# Patient Record
Sex: Female | Born: 1955 | Race: Black or African American | Hispanic: No | Marital: Single | Smoking: Former smoker
Health system: Southern US, Community
[De-identification: ages and names within clinical notes are randomized; demographics above are authoritative.]

## PROBLEM LIST (undated history)

## (undated) DIAGNOSIS — M1990 Unspecified osteoarthritis, unspecified site: Secondary | ICD-10-CM

## (undated) DIAGNOSIS — R51 Headache: Secondary | ICD-10-CM

## (undated) DIAGNOSIS — R350 Frequency of micturition: Secondary | ICD-10-CM

## (undated) DIAGNOSIS — T7840XA Allergy, unspecified, initial encounter: Secondary | ICD-10-CM

## (undated) DIAGNOSIS — R7303 Prediabetes: Secondary | ICD-10-CM

## (undated) DIAGNOSIS — E739 Lactose intolerance, unspecified: Secondary | ICD-10-CM

## (undated) DIAGNOSIS — F411 Generalized anxiety disorder: Secondary | ICD-10-CM

## (undated) DIAGNOSIS — R079 Chest pain, unspecified: Secondary | ICD-10-CM

## (undated) DIAGNOSIS — E039 Hypothyroidism, unspecified: Secondary | ICD-10-CM

## (undated) DIAGNOSIS — E0590 Thyrotoxicosis, unspecified without thyrotoxic crisis or storm: Secondary | ICD-10-CM

## (undated) DIAGNOSIS — K5900 Constipation, unspecified: Secondary | ICD-10-CM

## (undated) DIAGNOSIS — E119 Type 2 diabetes mellitus without complications: Secondary | ICD-10-CM

## (undated) DIAGNOSIS — R0600 Dyspnea, unspecified: Secondary | ICD-10-CM

## (undated) DIAGNOSIS — M7710 Lateral epicondylitis, unspecified elbow: Secondary | ICD-10-CM

## (undated) DIAGNOSIS — N809 Endometriosis, unspecified: Secondary | ICD-10-CM

## (undated) DIAGNOSIS — J45909 Unspecified asthma, uncomplicated: Secondary | ICD-10-CM

## (undated) DIAGNOSIS — I429 Cardiomyopathy, unspecified: Secondary | ICD-10-CM

## (undated) DIAGNOSIS — L989 Disorder of the skin and subcutaneous tissue, unspecified: Secondary | ICD-10-CM

## (undated) DIAGNOSIS — M722 Plantar fascial fibromatosis: Secondary | ICD-10-CM

## (undated) DIAGNOSIS — M25519 Pain in unspecified shoulder: Secondary | ICD-10-CM

## (undated) DIAGNOSIS — I509 Heart failure, unspecified: Secondary | ICD-10-CM

## (undated) DIAGNOSIS — N183 Chronic kidney disease, stage 3 unspecified: Secondary | ICD-10-CM

## (undated) DIAGNOSIS — M8580 Other specified disorders of bone density and structure, unspecified site: Secondary | ICD-10-CM

## (undated) DIAGNOSIS — M2550 Pain in unspecified joint: Secondary | ICD-10-CM

## (undated) DIAGNOSIS — E559 Vitamin D deficiency, unspecified: Secondary | ICD-10-CM

## (undated) DIAGNOSIS — R062 Wheezing: Secondary | ICD-10-CM

## (undated) DIAGNOSIS — H269 Unspecified cataract: Secondary | ICD-10-CM

## (undated) DIAGNOSIS — G4730 Sleep apnea, unspecified: Secondary | ICD-10-CM

## (undated) DIAGNOSIS — B009 Herpesviral infection, unspecified: Secondary | ICD-10-CM

## (undated) DIAGNOSIS — M542 Cervicalgia: Secondary | ICD-10-CM

## (undated) DIAGNOSIS — J019 Acute sinusitis, unspecified: Secondary | ICD-10-CM

## (undated) DIAGNOSIS — E669 Obesity, unspecified: Secondary | ICD-10-CM

## (undated) DIAGNOSIS — L509 Urticaria, unspecified: Secondary | ICD-10-CM

## (undated) DIAGNOSIS — J309 Allergic rhinitis, unspecified: Secondary | ICD-10-CM

## (undated) DIAGNOSIS — Z91018 Allergy to other foods: Secondary | ICD-10-CM

## (undated) DIAGNOSIS — Z9189 Other specified personal risk factors, not elsewhere classified: Secondary | ICD-10-CM

## (undated) DIAGNOSIS — E785 Hyperlipidemia, unspecified: Secondary | ICD-10-CM

## (undated) DIAGNOSIS — K219 Gastro-esophageal reflux disease without esophagitis: Secondary | ICD-10-CM

## (undated) DIAGNOSIS — R7302 Impaired glucose tolerance (oral): Secondary | ICD-10-CM

## (undated) DIAGNOSIS — M549 Dorsalgia, unspecified: Secondary | ICD-10-CM

## (undated) DIAGNOSIS — L259 Unspecified contact dermatitis, unspecified cause: Secondary | ICD-10-CM

## (undated) DIAGNOSIS — M79609 Pain in unspecified limb: Secondary | ICD-10-CM

## (undated) DIAGNOSIS — N390 Urinary tract infection, site not specified: Secondary | ICD-10-CM

## (undated) DIAGNOSIS — D179 Benign lipomatous neoplasm, unspecified: Secondary | ICD-10-CM

## (undated) DIAGNOSIS — J069 Acute upper respiratory infection, unspecified: Secondary | ICD-10-CM

## (undated) HISTORY — DX: Allergy, unspecified, initial encounter: T78.40XA

## (undated) HISTORY — DX: Allergy to other foods: Z91.018

## (undated) HISTORY — DX: Cardiomyopathy, unspecified: I42.9

## (undated) HISTORY — DX: Allergic rhinitis, unspecified: J30.9

## (undated) HISTORY — DX: Vitamin D deficiency, unspecified: E55.9

## (undated) HISTORY — DX: Hypothyroidism, unspecified: E03.9

## (undated) HISTORY — DX: Constipation, unspecified: K59.00

## (undated) HISTORY — DX: Pain in unspecified joint: M25.50

## (undated) HISTORY — DX: Chest pain, unspecified: R07.9

## (undated) HISTORY — DX: Cervicalgia: M54.2

## (undated) HISTORY — DX: Headache: R51

## (undated) HISTORY — DX: Chronic kidney disease, stage 3 unspecified: N18.30

## (undated) HISTORY — DX: Pain in unspecified shoulder: M25.519

## (undated) HISTORY — DX: Unspecified osteoarthritis, unspecified site: M19.90

## (undated) HISTORY — DX: Frequency of micturition: R35.0

## (undated) HISTORY — DX: Hyperlipidemia, unspecified: E78.5

## (undated) HISTORY — DX: Other specified disorders of bone density and structure, unspecified site: M85.80

## (undated) HISTORY — PX: LAPAROTOMY: SHX154

## (undated) HISTORY — DX: Lateral epicondylitis, unspecified elbow: M77.10

## (undated) HISTORY — DX: Plantar fascial fibromatosis: M72.2

## (undated) HISTORY — DX: Thyrotoxicosis, unspecified without thyrotoxic crisis or storm: E05.90

## (undated) HISTORY — DX: Unspecified asthma, uncomplicated: J45.909

## (undated) HISTORY — PX: ENDOMETRIAL ABLATION: SHX621

## (undated) HISTORY — DX: Sleep apnea, unspecified: G47.30

## (undated) HISTORY — DX: Unspecified contact dermatitis, unspecified cause: L25.9

## (undated) HISTORY — DX: Impaired glucose tolerance (oral): R73.02

## (undated) HISTORY — DX: Heart failure, unspecified: I50.9

## (undated) HISTORY — DX: Chronic kidney disease, stage 3 (moderate): N18.3

## (undated) HISTORY — DX: Dorsalgia, unspecified: M54.9

## (undated) HISTORY — DX: Acute upper respiratory infection, unspecified: J06.9

## (undated) HISTORY — DX: Acute sinusitis, unspecified: J01.90

## (undated) HISTORY — PX: OTHER SURGICAL HISTORY: SHX169

## (undated) HISTORY — DX: Disorder of the skin and subcutaneous tissue, unspecified: L98.9

## (undated) HISTORY — PX: COLONOSCOPY: SHX174

## (undated) HISTORY — PX: POLYPECTOMY: SHX149

## (undated) HISTORY — DX: Urticaria, unspecified: L50.9

## (undated) HISTORY — DX: Pain in unspecified limb: M79.609

## (undated) HISTORY — DX: Unspecified cataract: H26.9

## (undated) HISTORY — PX: UTERINE FIBROID SURGERY: SHX826

## (undated) HISTORY — DX: Lactose intolerance, unspecified: E73.9

## (undated) HISTORY — DX: Other specified personal risk factors, not elsewhere classified: Z91.89

## (undated) HISTORY — DX: Endometriosis, unspecified: N80.9

## (undated) HISTORY — DX: Urinary tract infection, site not specified: N39.0

## (undated) HISTORY — DX: Obesity, unspecified: E66.9

## (undated) HISTORY — DX: Generalized anxiety disorder: F41.1

## (undated) HISTORY — DX: Herpesviral infection, unspecified: B00.9

## (undated) HISTORY — DX: Benign lipomatous neoplasm, unspecified: D17.9

## (undated) HISTORY — PX: WISDOM TOOTH EXTRACTION: SHX21

## (undated) HISTORY — PX: TUBAL LIGATION: SHX77

## (undated) HISTORY — DX: Wheezing: R06.2

---

## 1994-10-10 HISTORY — PX: LAMINECTOMY: SHX219

## 1998-03-11 ENCOUNTER — Ambulatory Visit (HOSPITAL_COMMUNITY): Admission: RE | Admit: 1998-03-11 | Discharge: 1998-03-11 | Payer: Self-pay | Admitting: Family Medicine

## 1999-03-10 ENCOUNTER — Other Ambulatory Visit: Admission: RE | Admit: 1999-03-10 | Discharge: 1999-03-10 | Payer: Self-pay | Admitting: Obstetrics and Gynecology

## 2000-12-14 ENCOUNTER — Other Ambulatory Visit: Admission: RE | Admit: 2000-12-14 | Discharge: 2000-12-14 | Payer: Self-pay | Admitting: Obstetrics and Gynecology

## 2001-11-24 ENCOUNTER — Emergency Department (HOSPITAL_COMMUNITY): Admission: EM | Admit: 2001-11-24 | Discharge: 2001-11-24 | Payer: Self-pay | Admitting: Emergency Medicine

## 2002-04-08 ENCOUNTER — Other Ambulatory Visit: Admission: RE | Admit: 2002-04-08 | Discharge: 2002-04-08 | Payer: Self-pay | Admitting: Obstetrics and Gynecology

## 2003-03-11 ENCOUNTER — Encounter: Payer: Self-pay | Admitting: Internal Medicine

## 2003-03-11 LAB — CONVERTED CEMR LAB

## 2003-04-25 ENCOUNTER — Other Ambulatory Visit: Admission: RE | Admit: 2003-04-25 | Discharge: 2003-04-25 | Payer: Self-pay | Admitting: Obstetrics and Gynecology

## 2004-10-13 ENCOUNTER — Other Ambulatory Visit: Admission: RE | Admit: 2004-10-13 | Discharge: 2004-10-13 | Payer: Self-pay | Admitting: Obstetrics and Gynecology

## 2005-01-07 ENCOUNTER — Ambulatory Visit: Payer: Self-pay | Admitting: Internal Medicine

## 2005-03-03 ENCOUNTER — Emergency Department (HOSPITAL_COMMUNITY): Admission: EM | Admit: 2005-03-03 | Discharge: 2005-03-03 | Payer: Self-pay | Admitting: Emergency Medicine

## 2005-03-04 ENCOUNTER — Ambulatory Visit: Payer: Self-pay | Admitting: Internal Medicine

## 2005-03-10 LAB — HM MAMMOGRAPHY

## 2005-07-18 ENCOUNTER — Ambulatory Visit: Payer: Self-pay | Admitting: Internal Medicine

## 2005-09-09 HISTORY — PX: OTHER SURGICAL HISTORY: SHX169

## 2005-09-26 ENCOUNTER — Encounter (INDEPENDENT_AMBULATORY_CARE_PROVIDER_SITE_OTHER): Payer: Self-pay | Admitting: *Deleted

## 2005-09-26 ENCOUNTER — Ambulatory Visit (HOSPITAL_COMMUNITY): Admission: RE | Admit: 2005-09-26 | Discharge: 2005-09-26 | Payer: Self-pay | Admitting: Obstetrics and Gynecology

## 2005-10-26 ENCOUNTER — Ambulatory Visit: Payer: Self-pay | Admitting: Internal Medicine

## 2005-10-31 ENCOUNTER — Ambulatory Visit: Payer: Self-pay | Admitting: Internal Medicine

## 2005-11-02 ENCOUNTER — Other Ambulatory Visit: Admission: RE | Admit: 2005-11-02 | Discharge: 2005-11-02 | Payer: Self-pay | Admitting: Obstetrics and Gynecology

## 2006-04-07 ENCOUNTER — Ambulatory Visit: Payer: Self-pay | Admitting: Internal Medicine

## 2006-07-10 ENCOUNTER — Ambulatory Visit: Payer: Self-pay | Admitting: Internal Medicine

## 2006-07-18 ENCOUNTER — Ambulatory Visit: Payer: Self-pay

## 2006-08-26 ENCOUNTER — Ambulatory Visit: Payer: Self-pay | Admitting: Internal Medicine

## 2006-11-16 ENCOUNTER — Ambulatory Visit: Payer: Self-pay | Admitting: Internal Medicine

## 2007-02-15 ENCOUNTER — Ambulatory Visit: Payer: Self-pay | Admitting: Internal Medicine

## 2007-05-23 ENCOUNTER — Encounter: Payer: Self-pay | Admitting: Internal Medicine

## 2007-05-23 DIAGNOSIS — B009 Herpesviral infection, unspecified: Secondary | ICD-10-CM

## 2007-05-23 DIAGNOSIS — E0590 Thyrotoxicosis, unspecified without thyrotoxic crisis or storm: Secondary | ICD-10-CM

## 2007-05-23 DIAGNOSIS — E669 Obesity, unspecified: Secondary | ICD-10-CM

## 2007-05-23 DIAGNOSIS — N809 Endometriosis, unspecified: Secondary | ICD-10-CM

## 2007-05-23 DIAGNOSIS — Z9189 Other specified personal risk factors, not elsewhere classified: Secondary | ICD-10-CM

## 2007-05-23 DIAGNOSIS — J45909 Unspecified asthma, uncomplicated: Secondary | ICD-10-CM | POA: Insufficient documentation

## 2007-05-23 DIAGNOSIS — F411 Generalized anxiety disorder: Secondary | ICD-10-CM | POA: Insufficient documentation

## 2007-05-23 DIAGNOSIS — B0229 Other postherpetic nervous system involvement: Secondary | ICD-10-CM | POA: Insufficient documentation

## 2007-05-23 HISTORY — DX: Herpesviral infection, unspecified: B00.9

## 2007-05-23 HISTORY — DX: Other specified personal risk factors, not elsewhere classified: Z91.89

## 2007-05-23 HISTORY — DX: Generalized anxiety disorder: F41.1

## 2007-05-23 HISTORY — DX: Obesity, unspecified: E66.9

## 2007-05-23 HISTORY — DX: Unspecified asthma, uncomplicated: J45.909

## 2007-05-23 HISTORY — DX: Endometriosis, unspecified: N80.9

## 2007-05-23 HISTORY — DX: Thyrotoxicosis, unspecified without thyrotoxic crisis or storm: E05.90

## 2007-06-21 ENCOUNTER — Ambulatory Visit: Payer: Self-pay | Admitting: Internal Medicine

## 2007-06-21 LAB — CONVERTED CEMR LAB
ALT: 27 units/L (ref 0–35)
Alkaline Phosphatase: 79 units/L (ref 39–117)
Basophils Relative: 1.2 % — ABNORMAL HIGH (ref 0.0–1.0)
CO2: 28 meq/L (ref 19–32)
Eosinophils Absolute: 0 10*3/uL (ref 0.0–0.6)
GFR calc non Af Amer: 56 mL/min
Glucose, Bld: 90 mg/dL (ref 70–99)
HDL: 56.3 mg/dL (ref >39.0)
LDL Cholesterol: 80 mg/dL (ref 0–99)
Lymphocytes Relative: 27 % (ref 12.0–46.0)
Monocytes Absolute: 0.5 10*3/uL (ref 0.2–0.7)
Neutro Abs: 4.7 10*3/uL (ref 1.4–7.7)
Neutrophils Relative %: 64.2 % (ref 43.0–77.0)
Platelets: 240 10*3/uL (ref 150–400)
Potassium: 3.7 meq/L (ref 3.5–5.1)
RDW: 11.9 % (ref 11.5–14.6)
Specific Gravity, Urine: <=1.005 (ref 1.000–1.03)
Total Bilirubin: 0.8 mg/dL (ref 0.3–1.2)
Total Protein, Urine: NEGATIVE mg/dL
Total Protein: 7.1 g/dL (ref 6.0–8.3)
Urine Glucose: NEGATIVE mg/dL
Urobilinogen, UA: 0.2 (ref 0.0–1.0)
WBC: 7.3 10*3/uL (ref 4.5–10.5)
pH: 7 (ref 5.0–8.0)

## 2007-06-22 ENCOUNTER — Ambulatory Visit: Payer: Self-pay | Admitting: Internal Medicine

## 2007-11-02 ENCOUNTER — Telehealth (INDEPENDENT_AMBULATORY_CARE_PROVIDER_SITE_OTHER): Payer: Self-pay | Admitting: *Deleted

## 2008-01-10 ENCOUNTER — Encounter: Payer: Self-pay | Admitting: Internal Medicine

## 2008-04-29 ENCOUNTER — Ambulatory Visit: Payer: Self-pay | Admitting: Internal Medicine

## 2008-04-29 DIAGNOSIS — N390 Urinary tract infection, site not specified: Secondary | ICD-10-CM

## 2008-04-29 HISTORY — DX: Urinary tract infection, site not specified: N39.0

## 2008-04-29 LAB — CONVERTED CEMR LAB
Bacteria, UA: NEGATIVE
Bilirubin Urine: NEGATIVE
Blood in Urine, dipstick: NEGATIVE
Crystals: NEGATIVE
Ketones, ur: NEGATIVE mg/dL
Ketones, urine, test strip: NEGATIVE
Mucus, UA: NEGATIVE
Nitrite: NEGATIVE
Nitrite: NEGATIVE
Protein, U semiquant: NEGATIVE
Specific Gravity, Urine: 1
Urobilinogen, UA: 0.2
Urobilinogen, UA: 0.2 (ref 0.0–1.0)

## 2008-04-30 ENCOUNTER — Encounter: Payer: Self-pay | Admitting: Internal Medicine

## 2008-05-09 ENCOUNTER — Encounter: Payer: Self-pay | Admitting: Internal Medicine

## 2008-05-09 DIAGNOSIS — J309 Allergic rhinitis, unspecified: Secondary | ICD-10-CM | POA: Insufficient documentation

## 2008-05-09 DIAGNOSIS — E039 Hypothyroidism, unspecified: Secondary | ICD-10-CM | POA: Insufficient documentation

## 2008-05-09 HISTORY — DX: Hypothyroidism, unspecified: E03.9

## 2008-05-09 HISTORY — DX: Allergic rhinitis, unspecified: J30.9

## 2008-07-21 ENCOUNTER — Ambulatory Visit: Payer: Self-pay | Admitting: Internal Medicine

## 2008-07-24 ENCOUNTER — Telehealth: Payer: Self-pay | Admitting: Internal Medicine

## 2008-07-25 ENCOUNTER — Ambulatory Visit: Payer: Self-pay | Admitting: Internal Medicine

## 2008-07-25 DIAGNOSIS — R079 Chest pain, unspecified: Secondary | ICD-10-CM

## 2008-07-25 HISTORY — DX: Chest pain, unspecified: R07.9

## 2008-11-03 ENCOUNTER — Ambulatory Visit: Payer: Self-pay | Admitting: Internal Medicine

## 2008-11-03 DIAGNOSIS — J019 Acute sinusitis, unspecified: Secondary | ICD-10-CM

## 2008-11-03 HISTORY — DX: Acute sinusitis, unspecified: J01.90

## 2009-03-10 ENCOUNTER — Ambulatory Visit: Payer: Self-pay | Admitting: Internal Medicine

## 2009-03-10 ENCOUNTER — Telehealth: Payer: Self-pay | Admitting: Internal Medicine

## 2009-03-10 DIAGNOSIS — M7710 Lateral epicondylitis, unspecified elbow: Secondary | ICD-10-CM

## 2009-03-10 HISTORY — DX: Lateral epicondylitis, unspecified elbow: M77.10

## 2009-03-10 LAB — CONVERTED CEMR LAB
ALT: 22 units/L (ref 0–35)
AST: 29 units/L (ref 0–37)
Albumin: 4.1 g/dL (ref 3.5–5.2)
Bilirubin Urine: NEGATIVE
Bilirubin, Direct: 0.2 mg/dL (ref 0.0–0.3)
Cholesterol: 185 mg/dL (ref 0–200)
Eosinophils Absolute: 0.1 10*3/uL (ref 0.0–0.7)
Eosinophils Relative: 0.8 % (ref 0.0–5.0)
LDL Cholesterol: 99 mg/dL (ref 0–99)
Lymphocytes Relative: 26.6 % (ref 12.0–46.0)
Lymphs Abs: 1.9 10*3/uL (ref 0.7–4.0)
MCHC: 34.9 g/dL (ref 30.0–36.0)
MCV: 89.6 fL (ref 78.0–100.0)
Monocytes Absolute: 0.4 10*3/uL (ref 0.1–1.0)
Neutro Abs: 4.5 10*3/uL (ref 1.4–7.7)
Neutrophils Relative %: 66.7 % (ref 43.0–77.0)
Nitrite: NEGATIVE
Platelets: 255 10*3/uL (ref 150.0–400.0)
RBC: 4.72 M/uL (ref 3.87–5.11)
Sodium: 142 meq/L (ref 135–145)
TSH: 6.01 microintl units/mL — ABNORMAL HIGH (ref 0.35–5.50)
Total Bilirubin: 1.3 mg/dL — ABNORMAL HIGH (ref 0.3–1.2)
Total Protein: 7.5 g/dL (ref 6.0–8.3)
Triglycerides: 40 mg/dL (ref 0.0–149.0)
Urobilinogen, UA: 0.2 (ref 0.0–1.0)
pH: 6 (ref 5.0–8.0)

## 2009-04-28 ENCOUNTER — Telehealth (INDEPENDENT_AMBULATORY_CARE_PROVIDER_SITE_OTHER): Payer: Self-pay | Admitting: *Deleted

## 2009-04-28 ENCOUNTER — Encounter: Payer: Self-pay | Admitting: Internal Medicine

## 2009-04-30 ENCOUNTER — Encounter: Payer: Self-pay | Admitting: Internal Medicine

## 2009-04-30 LAB — CONVERTED CEMR LAB: Vit D, 25-Hydroxy: 15 ng/mL — ABNORMAL LOW (ref 30–89)

## 2009-05-05 ENCOUNTER — Telehealth (INDEPENDENT_AMBULATORY_CARE_PROVIDER_SITE_OTHER): Payer: Self-pay | Admitting: *Deleted

## 2009-05-11 ENCOUNTER — Ambulatory Visit: Payer: Self-pay | Admitting: Internal Medicine

## 2009-05-12 LAB — CONVERTED CEMR LAB: Anti Nuclear Antibody(ANA): NEGATIVE

## 2009-05-14 ENCOUNTER — Telehealth: Payer: Self-pay | Admitting: Internal Medicine

## 2009-06-04 ENCOUNTER — Encounter: Payer: Self-pay | Admitting: Internal Medicine

## 2009-06-04 ENCOUNTER — Ambulatory Visit: Payer: Self-pay | Admitting: Internal Medicine

## 2009-06-04 DIAGNOSIS — L509 Urticaria, unspecified: Secondary | ICD-10-CM

## 2009-06-04 HISTORY — DX: Urticaria, unspecified: L50.9

## 2009-06-04 LAB — CONVERTED CEMR LAB
ALT: 18 units/L (ref 0–35)
BUN: 9 mg/dL (ref 6–23)
CO2: 29 meq/L (ref 19–32)
Chloride: 104 meq/L (ref 96–112)
Creatinine, Ser: 1 mg/dL (ref 0.4–1.2)
Eosinophils Relative: 0.7 % (ref 0.0–5.0)
GFR calc non Af Amer: 74.83 mL/min (ref >60)
Glucose, Urine, Semiquant: NEGATIVE
HCT: 41.4 % (ref 36.0–46.0)
Lymphocytes Relative: 27.4 % (ref 12.0–46.0)
Lymphs Abs: 2.3 10*3/uL (ref 0.7–4.0)
MCHC: 35.4 g/dL (ref 30.0–36.0)
MCV: 89.4 fL (ref 78.0–100.0)
Monocytes Relative: 5.3 % (ref 3.0–12.0)
Neutro Abs: 5.4 10*3/uL (ref 1.4–7.7)
Neutrophils Relative %: 65.5 % (ref 43.0–77.0)
Platelets: 241 10*3/uL (ref 150.0–400.0)
Potassium: 3.6 meq/L (ref 3.5–5.1)
RDW: 12.6 % (ref 11.5–14.6)
Specific Gravity, Urine: <1.005
TSH: 1.53 microintl units/mL (ref 0.35–5.50)
Urobilinogen, UA: 0.2
WBC: 8.3 10*3/uL (ref 4.5–10.5)
pH: 5

## 2009-06-05 ENCOUNTER — Encounter (INDEPENDENT_AMBULATORY_CARE_PROVIDER_SITE_OTHER): Payer: Self-pay | Admitting: *Deleted

## 2009-06-05 ENCOUNTER — Encounter: Payer: Self-pay | Admitting: Internal Medicine

## 2009-06-07 LAB — CONVERTED CEMR LAB
Hep B S Ab: NEGATIVE
Hepatitis B Surface Ag: NEGATIVE

## 2009-06-08 ENCOUNTER — Telehealth: Payer: Self-pay | Admitting: Internal Medicine

## 2009-06-09 ENCOUNTER — Ambulatory Visit: Payer: Self-pay | Admitting: Internal Medicine

## 2009-06-09 DIAGNOSIS — L259 Unspecified contact dermatitis, unspecified cause: Secondary | ICD-10-CM

## 2009-06-09 HISTORY — DX: Unspecified contact dermatitis, unspecified cause: L25.9

## 2009-06-11 ENCOUNTER — Encounter: Payer: Self-pay | Admitting: Internal Medicine

## 2009-08-28 ENCOUNTER — Ambulatory Visit: Payer: Self-pay | Admitting: Internal Medicine

## 2009-08-28 DIAGNOSIS — E739 Lactose intolerance, unspecified: Secondary | ICD-10-CM

## 2009-08-28 DIAGNOSIS — E559 Vitamin D deficiency, unspecified: Secondary | ICD-10-CM

## 2009-08-28 DIAGNOSIS — J069 Acute upper respiratory infection, unspecified: Secondary | ICD-10-CM | POA: Insufficient documentation

## 2009-08-28 HISTORY — DX: Acute upper respiratory infection, unspecified: J06.9

## 2009-08-28 HISTORY — DX: Lactose intolerance, unspecified: E73.9

## 2009-08-28 HISTORY — DX: Vitamin D deficiency, unspecified: E55.9

## 2009-09-24 ENCOUNTER — Encounter: Payer: Self-pay | Admitting: Internal Medicine

## 2009-09-25 ENCOUNTER — Encounter: Payer: Self-pay | Admitting: Internal Medicine

## 2009-09-28 ENCOUNTER — Telehealth: Payer: Self-pay | Admitting: Internal Medicine

## 2009-10-01 ENCOUNTER — Encounter: Payer: Self-pay | Admitting: Internal Medicine

## 2009-10-11 ENCOUNTER — Emergency Department (HOSPITAL_COMMUNITY): Admission: EM | Admit: 2009-10-11 | Discharge: 2009-10-12 | Payer: Self-pay | Admitting: Emergency Medicine

## 2009-10-12 ENCOUNTER — Encounter: Payer: Self-pay | Admitting: Internal Medicine

## 2009-10-19 ENCOUNTER — Encounter (INDEPENDENT_AMBULATORY_CARE_PROVIDER_SITE_OTHER): Payer: Self-pay | Admitting: *Deleted

## 2009-10-23 ENCOUNTER — Ambulatory Visit: Payer: Self-pay | Admitting: Internal Medicine

## 2009-10-23 DIAGNOSIS — M79609 Pain in unspecified limb: Secondary | ICD-10-CM

## 2009-10-23 HISTORY — DX: Pain in unspecified limb: M79.609

## 2010-04-15 ENCOUNTER — Encounter: Payer: Self-pay | Admitting: Internal Medicine

## 2010-05-17 ENCOUNTER — Ambulatory Visit: Payer: Self-pay | Admitting: Internal Medicine

## 2010-05-17 DIAGNOSIS — R350 Frequency of micturition: Secondary | ICD-10-CM

## 2010-05-17 DIAGNOSIS — M25519 Pain in unspecified shoulder: Secondary | ICD-10-CM

## 2010-05-17 HISTORY — DX: Pain in unspecified shoulder: M25.519

## 2010-05-17 HISTORY — DX: Frequency of micturition: R35.0

## 2010-05-17 LAB — CONVERTED CEMR LAB
Bilirubin Urine: NEGATIVE
Ketones, urine, test strip: NEGATIVE
Nitrite: NEGATIVE
Protein, U semiquant: NEGATIVE
Urobilinogen, UA: 0.2
WBC Urine, dipstick: NEGATIVE
pH: 5

## 2010-05-18 ENCOUNTER — Encounter: Payer: Self-pay | Admitting: Internal Medicine

## 2010-05-24 ENCOUNTER — Telehealth: Payer: Self-pay | Admitting: Internal Medicine

## 2010-06-02 ENCOUNTER — Ambulatory Visit: Payer: Self-pay | Admitting: Internal Medicine

## 2010-06-02 DIAGNOSIS — R51 Headache: Secondary | ICD-10-CM

## 2010-06-02 DIAGNOSIS — R519 Headache, unspecified: Secondary | ICD-10-CM | POA: Insufficient documentation

## 2010-06-02 HISTORY — DX: Headache: R51

## 2010-06-03 ENCOUNTER — Telehealth: Payer: Self-pay | Admitting: Internal Medicine

## 2010-06-03 ENCOUNTER — Ambulatory Visit: Payer: Self-pay | Admitting: Internal Medicine

## 2010-06-03 ENCOUNTER — Telehealth (INDEPENDENT_AMBULATORY_CARE_PROVIDER_SITE_OTHER): Payer: Self-pay | Admitting: *Deleted

## 2010-06-07 ENCOUNTER — Encounter: Payer: Self-pay | Admitting: Internal Medicine

## 2010-07-12 ENCOUNTER — Ambulatory Visit: Payer: Self-pay | Admitting: Internal Medicine

## 2010-07-12 DIAGNOSIS — R062 Wheezing: Secondary | ICD-10-CM

## 2010-07-12 HISTORY — DX: Wheezing: R06.2

## 2010-08-02 ENCOUNTER — Encounter: Payer: Self-pay | Admitting: Internal Medicine

## 2010-08-02 LAB — CONVERTED CEMR LAB
AST: 19 units/L (ref 0–37)
Alkaline Phosphatase: 67 units/L (ref 39–117)
Chloride: 105 meq/L (ref 96–112)
Cholesterol: 167 mg/dL (ref 0–200)
Creatinine, Ser: 0.9 mg/dL (ref 0.4–1.2)
GFR calc non Af Amer: 84.13 mL/min (ref >60)
HCT: 38.1 % (ref 36.0–46.0)
HDL: 68.2 mg/dL (ref >39.00)
Hgb A1c MFr Bld: 5.3 % (ref 4.6–6.5)
LDL Cholesterol: 89 mg/dL (ref 0–99)
Lymphs Abs: 1.7 10*3/uL (ref 0.7–4.0)
MCHC: 34.9 g/dL (ref 30.0–36.0)
Monocytes Relative: 7.4 % (ref 3.0–12.0)
RBC: 4.21 M/uL (ref 3.87–5.11)
RDW: 13.3 % (ref 11.5–14.6)
Specific Gravity, Urine: <=1.005 (ref 1.000–1.030)
TSH: 5.4 microintl units/mL (ref 0.35–5.50)
Total CHOL/HDL Ratio: 2
Triglycerides: 49 mg/dL (ref 0.0–149.0)
Urine Glucose: NEGATIVE mg/dL
Urobilinogen, UA: 0.2 (ref 0.0–1.0)
VLDL: 9.8 mg/dL (ref 0.0–40.0)
WBC: 6.7 10*3/uL (ref 4.5–10.5)
pH: 6.5 (ref 5.0–8.0)

## 2010-10-22 ENCOUNTER — Ambulatory Visit
Admission: RE | Admit: 2010-10-22 | Discharge: 2010-10-22 | Payer: Self-pay | Source: Home / Self Care | Attending: Internal Medicine | Admitting: Internal Medicine

## 2010-10-22 DIAGNOSIS — D179 Benign lipomatous neoplasm, unspecified: Secondary | ICD-10-CM | POA: Insufficient documentation

## 2010-10-22 HISTORY — DX: Benign lipomatous neoplasm, unspecified: D17.9

## 2010-11-09 NOTE — Progress Notes (Signed)
  Phone Note Call from Patient   Summary of Call: Patient is requesting rx for antibiotic. Her dentist has canceled her surgery until after scan and ok from Dr [name].  Initial call taken by: Lamar Sprinkles, CMA,  June 03, 2010 11:52 AM  Follow-up for Phone Call        she already has doxycycline for now; and I have not seen results Ct scan;  not clear if she needs other antibx at this time Follow-up by: Corwin Levins MD,  June 03, 2010 1:11 PM  Additional Follow-up for Phone Call Additional follow up Details #1::        Pt informed at office by [facility] office personnel after speaking with JWJ Additional Follow-up by: Margaret Pyle, CMA,  June 03, 2010 1:42 PM

## 2010-11-09 NOTE — Assessment & Plan Note (Signed)
Summary: ringing in right ear/cd   Vital Signs:  Patient profile:   55 year old female Height:      65 inches Weight:      212 pounds BMI:     35.41 O2 Sat:      97 % on Room air Temp:     97.5 degrees F oral Pulse rate:   79 / minute BP sitting:   110 / 72  (left arm) Cuff size:   regular  Vitals Entered ByZella Ball Ewing (October 23, 2009 5:17 PM)  O2 Flow:  Room air CC: ringing in both ears,sharp head pain/RE   Primary Care Provider:  Corwin Levins MD  CC:  ringing in both ears and sharp head pain/RE.  History of Present Illness: here with 3 days increaseing bilateral ear pain, mod to severe with sharp shooting pain headache, tinnitus off and on, with malaise and fever, mild sinus congestion;  has seen ENT in the past with exam not c/w need for sinus surgury;  tx with vit D recently per GYN - now on 50K vit d per wk for several months;  has ongoing sinus allergy symptoms prior to more acute symtpoms above for several months no responding to previous tx;  had recent breast biopsy left side (benign) but since then seemingly with vague left shoudler and arm aching but nontender, nonswollen without erythema or rash and no change in normal ROM or other funciton;  Pt denies CP, sob, doe, wheezing, orthopnea, pnd, worsening LE edema, palps, dizziness or syncope  Pt denies new neuro symptoms such as other headache, facial or extremity weakness   Problems Prior to Update: 1)  Uri  (ICD-465.9) 2)  Vitamin D Deficiency  (ICD-268.9) 3)  Glucose Intolerance  (ICD-271.3) 4)  Uri  (ICD-465.9) 5)  Contact Dermatitis  (ICD-692.9) 6)  Chest Pain  (ICD-786.50) 7)  Unspecified Urticaria  (ICD-708.9) 8)  Lateral Epicondylitis, Right  (ICD-726.32) 9)  Preventive Health Care  (ICD-V70.0) 10)  Sinusitis- Acute-nos  (ICD-461.9) 11)  Chest Pain Unspecified  (ICD-786.50) 12)  Allergic Rhinitis  (ICD-477.9) 13)  Hypothyroidism  (ICD-244.9) 14)  Uti  (ICD-599.0) 15)  Herpes Zoster W/nervous  Complication Nec  (ICD-053.19) 16)  Insomnia, Hx of  (ICD-V15.89) 17)  Anxiety  (ICD-300.00) 18)  Endometriosis Nos  (ICD-617.9) 19)  Herpes Simplex, Uncomplicated  (ICD-054.9) 20)  Obesity  (ICD-278.00) 21)  Asthma  (ICD-493.90) 22)  Hyperthyroidism  (ICD-242.90)  Medications Prior to Update: 1)  Symbicort 160-4.5 Mcg/act Aero (Budesonide-Formoterol Fumarate) .... 2 Puffs Two Times A Day 2)  Proair Hfa 108 (90 Base) Mcg/act Aers (Albuterol Sulfate) .... 2 Puff Four Times Per Day As Needed 3)  Levothyroxine Sodium 125 Mcg Tabs (Levothyroxine Sodium) .[name].. 1po Once Daily 4)  Hydroxyzine Hcl 10 Mg Tabs (Hydroxyzine Hcl) .... Take 1 By Mouth Qd 5)  Fluocinolone Acetonide 0.025 % Crea (Fluocinolone Acetonide) .... Use Prn 6)  Cervae Lotion .... Use Qd 7)  Benadryl 25 Mg Tabs (Diphenhydramine Hcl) .... Take 1 By Mouth Qd 8)  Claritin 10 Mg Tabs (Loratadine) .[name].. 1 By Mouth Two Times A Day 9)  Womens One Daily  Tabs (Multiple Vitamins-Minerals) .... Take 1 By Mouth Qd 10)  Caltrate 600 1500 Mg Tabs (Calcium Carbonate) .... Take 1 By Mouth Qd 11)  Triamcinolone Acetonide 0.5 % Crea (Triamcinolone Acetonide) .... Apply To Affected Skin -3x Daily 12)  Fluconazole 150 Mg Tabs (Fluconazole) .[name].. 1 By Mouth Once Daily As Needed For Yeast 13)  Cephalexin 500 Mg Caps (Cephalexin) .[name].. 1 By Mouth Three Times A Day  Current Medications (verified): 1)  Symbicort 160-4.5 Mcg/act Aero (Budesonide-Formoterol Fumarate) .... 2 Puffs Two Times A Day 2)  Proair Hfa 108 (90 Base) Mcg/act Aers (Albuterol Sulfate) .... 2 Puff Four Times Per Day As Needed 3)  Levothyroxine Sodium 125 Mcg Tabs (Levothyroxine Sodium) .[name].. 1po Once Daily 4)  Hydroxyzine Hcl 10 Mg Tabs (Hydroxyzine Hcl) .... Take 1 By Mouth Qd 5)  Fluocinolone Acetonide 0.025 % Crea (Fluocinolone Acetonide) .... Use Prn 6)  Cervae Lotion .... Use Qd 7)  Benadryl 25 Mg Tabs (Diphenhydramine Hcl) .... Take 1 By Mouth Qd 8)  Claritin 10 Mg Tabs  (Loratadine) .[name].. 1 By Mouth Two Times A Day 9)  Womens One Daily  Tabs (Multiple Vitamins-Minerals) .... Take 1 By Mouth Qd 10)  Caltrate 600 1500 Mg Tabs (Calcium Carbonate) .... Take 1 By Mouth Qd 11)  Triamcinolone Acetonide 0.5 % Crea (Triamcinolone Acetonide) .... Apply To Affected Skin -3x Daily 12)  Fluconazole 150 Mg Tabs (Fluconazole) .[name].. 1 By Mouth Once Daily As Needed For Yeast 13)  Cephalexin 500 Mg Caps (Cephalexin) .[name].. 1 By Mouth Three Times A Day 14)  Medroxyprogesterone Acetate 2.5 Mg Tabs (Medroxyprogesterone Acetate) .[name].. 1 Times 10 Days Every Month 15)  Vivelle-Dot 0.1 Mg/24hr Pttw (Estradiol) .... 2 Times A Week 16)  Tramadol Hcl 50 Mg Tabs (Tramadol Hcl) .[name].. 1 By Mouth Qid As Needed 17)  Flexeril 5 Mg Tabs (Cyclobenzaprine Hcl) .[name].. 1 By Mouth Three Times A Day  Allergies (verified): 1)  ! Flagyl 2)  ! * Z Pack 3)  ! Biaxin  Past History:  Past Medical History: Last updated: 08/28/2009 Asthma Obesity Herpes, oral Hx  Endometriosis Anxiety Insomnia Shingles 1/07 Hypothyroidism - per endocrinology Allergic rhinitis c-spine DJD/DDD glucose intolerance  Past Surgical History: Last updated: 05/09/2008 Laminectomy- 1996 Laparotomy-exploratory s/p endometrial ablation 12/06 Tubal ligation  Social History: Last updated: 06/09/2009 Married Alcohol use-no 3 children Never Smoked Systems developer [place] Marital Status: Married Children:  Occupation:   Risk Factors: Smoking Status: never (05/09/2008)  Review of Systems       all otherwise negative per pt -  Physical Exam  General:  alert and overweight-appearing.   Head:  normocephalic and atraumatic.   Eyes:  vision grossly intact, pupils equal, and pupils round.   Ears:  bilat tm's red, sinus nontender Nose:  no external deformity, nasal dischargemucosal pallor, and mucosal erythema.   Mouth:  pharyngeal erythema and fair dentition.   Neck:  supple and cervical  lymphadenopathy.   Lungs:  normal respiratory effort and normal breath sounds.   Heart:  normal rate and regular rhythm.   Abdomen:  soft, non-tender, and normal bowel sounds.   Msk:  no joint tenderness and no joint swelling.  , left shoudler nontender and FROM, no rash Extremities:  no edema, no erythema  Neurologic:  cranial nerves II-XII intact, strength normal in upper extremities, and sensation intact to light touch.     Impression & Recommendations:  Problem # 1:  URI (ICD-465.9)  Her updated medication list for this problem includes:    Benadryl 25 Mg Tabs (Diphenhydramine hcl) .[name]... Take 1 by mouth qd    Claritin 10 Mg Tabs (Loratadine) .[name]... 1 by mouth two times a day treat as above, f/u any worsening signs or symptoms , with antibx  Problem # 2:  ALLERGIC RHINITIS (ICD-477.9)  Her updated medication list for  this problem includes:    Benadryl 25 Mg Tabs (Diphenhydramine hcl) .[name]... Take 1 by mouth qd    Claritin 10 Mg Tabs (Loratadine) .[name]... 1 by mouth two times a day treat as above, f/u any worsening signs or symptoms , declines trial change to allegra at this time  Problem # 3:  ARM PAIN, LEFT (ICD-729.5) exam benign, tx with pain med as needed; no CP or other CV symptoms  Problem # 4:  VITAMIN D DEFICIENCY (ICD-268.9) d/w pt  - cont tx as per GYN  Complete Medication List: 1)  Symbicort 160-4.5 Mcg/act Aero (Budesonide-formoterol fumarate) .... 2 puffs two times a day 2)  Proair Hfa 108 (90 Base) Mcg/act Aers (Albuterol sulfate) .... 2 puff four times per day as needed 3)  Levothyroxine Sodium 125 Mcg Tabs (Levothyroxine sodium) .[name].. 1po once daily 4)  Hydroxyzine Hcl 10 Mg Tabs (Hydroxyzine hcl) .... Take 1 by mouth qd 5)  Fluocinolone Acetonide 0.025 % Crea (Fluocinolone acetonide) .... Use prn 6)  Cervae Lotion  .... Use qd 7)  Benadryl 25 Mg Tabs (Diphenhydramine hcl) .... Take 1 by mouth qd 8)  Claritin 10 Mg Tabs (Loratadine) .[name].. 1 by mouth two times a  day 9)  Womens One Daily Tabs (Multiple vitamins-minerals) .... Take 1 by mouth qd 10)  Caltrate 600 1500 Mg Tabs (Calcium carbonate) .... Take 1 by mouth qd 11)  Triamcinolone Acetonide 0.5 % Crea (Triamcinolone acetonide) .... Apply to affected skin -3x daily 12)  Fluconazole 150 Mg Tabs (Fluconazole) .[name].. 1 by mouth once daily as needed for yeast 13)  Cephalexin 500 Mg Caps (Cephalexin) .[name].. 1 by mouth three times a day 14)  Medroxyprogesterone Acetate 2.5 Mg Tabs (Medroxyprogesterone acetate) .[name].. 1 times 10 days every month 15)  Vivelle-dot 0.1 Mg/24hr Pttw (Estradiol) .... 2 times a week 16)  Tramadol Hcl 50 Mg Tabs (Tramadol hcl) .[name].. 1 by mouth qid as needed 17)  Flexeril 5 Mg Tabs (Cyclobenzaprine hcl) .[name].. 1 by mouth three times a day  Patient Instructions: 1)  Please take all new medications as prescribed 2)  Continue all previous medications as before this visit  3)  You can also use Mucinex OTC or it's generic for congestion  4)  Please schedule a follow-up appointment as needed. Prescriptions: FLEXERIL 5 MG TABS (CYCLOBENZAPRINE HCL) 1 by mouth three times a day  #60 x 1   Entered and Authorized by:   Corwin Levins MD   Signed by:   Corwin Levins MD on 10/23/2009   Method used:   Print then Give to Patient   RxID:   [id number] TRAMADOL HCL 50 MG TABS (TRAMADOL HCL) 1 by mouth qid as needed  #60 x 1   Entered and Authorized by:   Corwin Levins MD   Signed by:   Corwin Levins MD on 10/23/2009   Method used:   Print then Give to Patient   RxID:   [id number] CEPHALEXIN 500 MG CAPS (CEPHALEXIN) 1 by mouth three times a day  #30 x 0   Entered and Authorized by:   Corwin Levins MD   Signed by:   Corwin Levins MD on 10/23/2009   Method used:   Print then Give to Patient   RxID:   [id number]

## 2010-11-09 NOTE — Letter (Signed)
Summary: [facility]  [facility]   Imported By: Sherian Rein 06/15/2010 13:34:49  _____________________________________________________________________  External Attachment:    Type:   Image     Comment:   External Document

## 2010-11-09 NOTE — Progress Notes (Signed)
Summary: CT Scan?  Phone Note Call from Patient   Caller: Patient [phone] Summary of Call: Pt called stating that she was told at [facility] that a STAT CT scan would be ordered to determine if dental surgery is necessary this coming Friday. I do see CT in [place] but it is not STAT. Please advise Initial call taken by: Margaret Pyle, CMA,  June 03, 2010 9:18 AM  Follow-up for Phone Call        yes, supposed to be stat for later today - to PCC';s Follow-up by: Corwin Levins MD,  June 03, 2010 9:44 AM  Additional Follow-up for Phone Call Additional follow up Details #1::        Appt scheduled for today August 26,2011@1 :1:50 pm pt informed while in the office Shelbie Proctor  June 03, 2010 1:20 PM

## 2010-11-09 NOTE — Letter (Signed)
Summary: Primary Care Appointment Letter  [facility]  [address] [place], [place] [address]   Phone: [phone]  Fax: [phone]    10/12/2009 MRN: [id number]  Suzanne Hansen [address] [place], [place]  [address]  Dear Ms. Neal Dy,   Your Primary Care Physician Corwin Levins MD has indicated that:    ___X____it is time to schedule an appointment with Dr.Ellison per Dr. [name].  Dr. Everardo All is an Endocrinologist who is located in the same office as Dr. [name].  Please call to set up the appointment.    _______you missed your appointment on______ and need to call and          reschedule.    _______you need to have lab work done.    _______you need to schedule an appointment discuss lab or test results.    _______you need to call to reschedule your appointment that is                       scheduled on _________.     Please call our office as soon as possible. Our phone number is [phone]. Please press option 1. Our office is open 8a-12noon and 1p-5p, Monday through Friday.     Thank you,    [facility]

## 2010-11-09 NOTE — Letter (Signed)
Summary: [facility] Referral (unable to schedule) Notification   Gastroenterology  [address] [place], [place] [address]   Phone: [phone]  Fax: [phone]      October 19, 2009 Suzanne Hansen 1956/07/13 MRN: [id number]   Suzanne Hansen [address] [place], [place]  [address]   Dear Dr. [name]:   Thank you for your kind referral of the above patient. We have attempted to schedule the recommended Colonoscopy but have been unable to schedule because:  _x_ The patient was not available by phone and/or has not returned our calls.  __ The patient declined to schedule the procedure at this time.  We appreciate the referral and hope that we will have the opportunity to treat this patient in the future.    Sincerely,   [facility]  Vania Rea. Jarold Motto M.D. Hedwig Morton. Juanda Chance M.D. Venita Lick. Russella Dar M.D. Wilhemina Bonito. Marina Goodell M.D. Barbette Hair. Arlyce Dice M.D. Iva Boop M.D. Cheron Every.D.

## 2010-11-09 NOTE — Assessment & Plan Note (Signed)
Summary: cough,congestion/cd   Vital Signs:  Patient profile:   55 year old female Height:      65 inches Weight:      205.13 pounds BMI:     34.26 O2 Sat:      98 % on Room air Temp:     98.5 degrees F oral Pulse rate:   89 / minute BP sitting:   112 / 80  (left arm) Cuff size:   large  Vitals Entered By: Zella Ball Ewing CMA Duncan Dull) (July 12, 2010 11:43 AM)  O2 Flow:  Room air  Preventive Care Screening     decliens flu shot  CC: Cough and congestion/RE   Primary Care Provider:  Corwin Levins MD  CC:  Cough and congestion/RE.  History of Present Illness: here for wellness, with incidental 3 days onset head congestion, fever, slight ST, and now worsening prod cough, mild wheezing and sob/doe;  afeb here and denies chills at home.  Out of symbicort and proair so not able to use over the past weekend.  Illness not activity limiting if walks a bit slower.  Declines flu shot today Pt denies CP,orthopnea, pnd, worsening LE edema, palps, dizziness or syncope  Pt denies new neuro symptoms such as headache, facial or extremity weakness  Denies polydipsia , polyuria.  No recent wt loss, night sweats, loss of appetite or other constitutional symptoms   Problems Prior to Update: 1)  Preventive Health Care  (ICD-V70.0) 2)  Wheezing  (ICD-786.07) 3)  Bronchitis-acute  (ICD-466.0) 4)  Facial Pain  (ICD-784.0) 5)  Frequency, Urinary  (ICD-788.41) 6)  Shoulder Pain, Left  (ICD-719.41) 7)  Arm Pain, Left  (ICD-729.5) 8)  Uri  (ICD-465.9) 9)  Vitamin D Deficiency  (ICD-268.9) 10)  Glucose Intolerance  (ICD-271.3) 11)  Uri  (ICD-465.9) 12)  Contact Dermatitis  (ICD-692.9) 13)  Chest Pain  (ICD-786.50) 14)  Unspecified Urticaria  (ICD-708.9) 15)  Lateral Epicondylitis, Right  (ICD-726.32) 16)  Preventive Health Care  (ICD-V70.0) 17)  Sinusitis- Acute-nos  (ICD-461.9) 18)  Chest Pain Unspecified  (ICD-786.50) 19)  Allergic Rhinitis  (ICD-477.9) 20)  Hypothyroidism  (ICD-244.9) 21)  Uti   (ICD-599.0) 22)  Herpes Zoster W/nervous Complication Nec  (ICD-053.19) 23)  Insomnia, Hx of  (ICD-V15.89) 24)  Anxiety  (ICD-300.00) 25)  Endometriosis Nos  (ICD-617.9) 26)  Herpes Simplex, Uncomplicated  (ICD-054.9) 27)  Obesity  (ICD-278.00) 28)  Asthma  (ICD-493.90) 29)  Hyperthyroidism  (ICD-242.90)  Medications Prior to Update: 1)  Symbicort 160-4.5 Mcg/act Aero (Budesonide-Formoterol Fumarate) .... 2 Puffs Two Times A Day 2)  Proair Hfa 108 (90 Base) Mcg/act Aers (Albuterol Sulfate) .... 2 Puff Four Times Per Day As Needed 3)  Levothyroxine Sodium 125 Mcg Tabs (Levothyroxine Sodium) .[name].. 1po Once Daily 4)  Hydroxyzine Hcl 10 Mg Tabs (Hydroxyzine Hcl) .... Take 1 By Mouth Qd 5)  Fluocinolone Acetonide 0.025 % Crea (Fluocinolone Acetonide) .... Use Prn 6)  Cervae Lotion .... Use Qd 7)  Benadryl 25 Mg Tabs (Diphenhydramine Hcl) .... Take 1 By Mouth Qd 8)  Claritin 10 Mg Tabs (Loratadine) .[name].. 1 By Mouth Two Times A Day 9)  Womens One Daily  Tabs (Multiple Vitamins-Minerals) .... Take 1 By Mouth Qd 10)  Caltrate 600 1500 Mg Tabs (Calcium Carbonate) .... Take 1 By Mouth Qd 11)  Triamcinolone Acetonide 0.5 % Crea (Triamcinolone Acetonide) .... Apply To Affected Skin -3x Daily 12)  Fluconazole 150 Mg Tabs (Fluconazole) .[name].. 1 By Mouth Once Daily As Needed  For Yeast 13)  Ciprofloxacin Hcl 500 Mg Tabs (Ciprofloxacin Hcl) .[name].. 1po Two Times A Day 14)  Medroxyprogesterone Acetate 2.5 Mg Tabs (Medroxyprogesterone Acetate) .[name].. 1 Times 10 Days Every Month 15)  Vivelle-Dot 0.1 Mg/24hr Pttw (Estradiol) .... 2 Times A Week 16)  Tramadol Hcl 50 Mg Tabs (Tramadol Hcl) .[name].. 1 By Mouth Qid As Needed 17)  Robaxin 500 Mg Tabs (Methocarbamol) .[name].. 1po Two Times A Day As Needed 18)  Nabumetone 750 Mg Tabs (Nabumetone) .[name].. 1po Two Times A Day As Needed 19)  Difluc 150 20)  Fluconazole 150 Mg Tabs (Fluconazole) .[name].. 1po Q 3 Days As Needed  Current Medications (verified): 1)  Symbicort 160-4.5 Mcg/act  Aero (Budesonide-Formoterol Fumarate) .... 2 Puffs Two Times A Day 2)  Proair Hfa 108 (90 Base) Mcg/act Aers (Albuterol Sulfate) .... 2 Puff Four Times Per Day As Needed 3)  Levothyroxine Sodium 125 Mcg Tabs (Levothyroxine Sodium) .[name].. 1po Once Daily 4)  Hydroxyzine Hcl 10 Mg Tabs (Hydroxyzine Hcl) .... Take 1 By Mouth Qd 5)  Fluocinolone Acetonide 0.025 % Crea (Fluocinolone Acetonide) .... Use Prn 6)  Cervae Lotion .... Use Qd 7)  Benadryl 25 Mg Tabs (Diphenhydramine Hcl) .... Take 1 By Mouth Qd 8)  Claritin 10 Mg Tabs (Loratadine) .[name].. 1 By Mouth Two Times A Day 9)  Womens One Daily  Tabs (Multiple Vitamins-Minerals) .... Take 1 By Mouth Qd 10)  Caltrate 600 1500 Mg Tabs (Calcium Carbonate) .... Take 1 By Mouth Qd 11)  Triamcinolone Acetonide 0.5 % Crea (Triamcinolone Acetonide) .... Apply To Affected Skin -3x Daily 12)  Fluconazole 150 Mg Tabs (Fluconazole) .[name].. 1 By Mouth Once Daily As Needed For Yeast 13)  Ciprofloxacin Hcl 500 Mg Tabs (Ciprofloxacin Hcl) .[name].. 1po Two Times A Day 14)  Medroxyprogesterone Acetate 2.5 Mg Tabs (Medroxyprogesterone Acetate) .[name].. 1 Times 10 Days Every Month 15)  Vivelle-Dot 0.1 Mg/24hr Pttw (Estradiol) .... 2 Times A Week 16)  Tramadol Hcl 50 Mg Tabs (Tramadol Hcl) .[name].. 1 By Mouth Qid As Needed 17)  Robaxin 500 Mg Tabs (Methocarbamol) .[name].. 1po Two Times A Day As Needed 18)  Nabumetone 750 Mg Tabs (Nabumetone) .[name].. 1po Two Times A Day As Needed 19)  Difluc 150 20)  Fluconazole 150 Mg Tabs (Fluconazole) .[name].. 1po Q 3 Days As Needed 21)  Prednisone 10 Mg Tabs (Prednisone) .... 3po Qd For 3days, Then 2po Qd For 3days, Then 1po Qd For 3days, Then Stop 22)  Hydrocodone-Homatropine 5-1.5 Mg/56ml Syrp (Hydrocodone-Homatropine) .[name].. 1 Tsp By Mouth Q 6 Hrs As Needed Cough 23)  Cephalexin 500 Mg Caps (Cephalexin) .[name].. 1 Po Three Times A Day  Allergies (verified): 1)  ! Flagyl 2)  ! * Z Pack 3)  ! Biaxin  Past History:  Past Medical History: Last updated:  08/28/2009 Asthma Obesity Herpes, oral Hx  Endometriosis Anxiety Insomnia Shingles 1/07 Hypothyroidism - per endocrinology Allergic rhinitis c-spine DJD/DDD glucose intolerance  Past Surgical History: Last updated: 05/09/2008 Laminectomy- 1996 Laparotomy-exploratory s/p endometrial ablation 12/06 Tubal ligation  Family History: Last updated: 05/09/2008 grandmother with breast cancer cousin with DM HTN heart disease  Social History: Last updated: 06/09/2009 Married Alcohol use-no 3 children Never Smoked Systems developer [place] Marital Status: Married Children:  Occupation:   Risk Factors: Smoking Status: never (05/09/2008)  Review of Systems  The patient denies anorexia, weight loss, weight gain, vision loss, decreased hearing, hoarseness, chest pain, syncope, dyspnea on exertion, peripheral edema, hemoptysis, abdominal pain, melena, hematochezia, severe indigestion/heartburn, hematuria, muscle  weakness, suspicious skin lesions, transient blindness, depression, unusual weight change, abnormal bleeding, enlarged lymph nodes, and angioedema.         all otherwise negative per pt -    Physical Exam  General:  alert and overweight-appearing.  , mild ill  Head:  normocephalic and atraumatic.   Eyes:  vision grossly intact, pupils equal, and pupils round.   Ears:  bilat tm's red, sinus nontender Nose:  nasal dischargemucosal pallor and mucosal edema.   Mouth:  pharyngeal erythema and fair dentition.   Neck:  supple and cervical lymphadenopathy.   Lungs:  normal respiratory effort, R decreased breath sounds, R wheezes, L decreased breath sounds, and L wheezes.  but overall wheezing very mild, and pt in NAD Heart:  normal rate and regular rhythm.   Abdomen:  soft, non-tender, and normal bowel sounds.   Msk:  no joint tenderness and no joint swelling.   Extremities:  no edema, no erythema  Neurologic:  cranial nerves II-XII intact and strength  normal in all extremities.   Skin:  color normal and no rashes.   Psych:  not anxious appearing and not depressed appearing.     Impression & Recommendations:  Problem # 1:  Preventive Health Care (ICD-V70.0)  Overall doing well, age appropriate education and counseling updated and referral for appropriate preventive services done unless declined, immunizations up to date or declined, diet counseling done if overweight, urged to quit smoking if smokes , most recent labs reviewed and current ordered if appropriate, ecg reviewed or declined (interpretation per ECG scanned in the EMR if done); information regarding Medicare Prevention requirements given if appropriate; speciality referrals updated as appropriate   Orders: TLB-BMP (Basic Metabolic Panel-BMET) (80048-METABOL) TLB-CBC Platelet - w/Differential (85025-CBCD) TLB-Hepatic/Liver Function Pnl (80076-HEPATIC) TLB-Lipid Panel (80061-LIPID) TLB-TSH (Thyroid Stimulating Hormone) (84443-TSH) TLB-Udip w/ Micro (81001-URINE)  Problem # 2:  BRONCHITIS-ACUTE (ICD-466.0)  Her updated medication list for this problem includes:    Symbicort 160-4.5 Mcg/act Aero (Budesonide-formoterol fumarate) .[name]... 2 puffs two times a day    Proair Hfa 108 (90 Base) Mcg/act Aers (Albuterol sulfate) .[name]... 2 puff four times per day as needed    Ciprofloxacin Hcl 500 Mg Tabs (Ciprofloxacin hcl) .[name]... 1po two times a day    Hydrocodone-homatropine 5-1.5 Mg/51ml Syrp (Hydrocodone-homatropine) .[name]... 1 tsp by mouth q 6 hrs as needed cough    Cephalexin 500 Mg Caps (Cephalexin) .[name]... 1 po three times a day treat as above, f/u any worsening signs or symptoms   Problem # 3:  WHEEZING (ICD-786.07) likely due to above, ok for pred pack, and refill inhalers  Problem # 4:  GLUCOSE INTOLERANCE (ICD-271.3) pt asympt - to check a1c today Orders: TLB-A1C / Hgb A1C (Glycohemoglobin) (83036-A1C)  Complete Medication List: 1)  Symbicort 160-4.5 Mcg/act Aero  (Budesonide-formoterol fumarate) .... 2 puffs two times a day 2)  Proair Hfa 108 (90 Base) Mcg/act Aers (Albuterol sulfate) .... 2 puff four times per day as needed 3)  Levothyroxine Sodium 125 Mcg Tabs (Levothyroxine sodium) .[name].. 1po once daily 4)  Hydroxyzine Hcl 10 Mg Tabs (Hydroxyzine hcl) .... Take 1 by mouth qd 5)  Fluocinolone Acetonide 0.025 % Crea (Fluocinolone acetonide) .... Use prn 6)  Cervae Lotion  .... Use qd 7)  Benadryl 25 Mg Tabs (Diphenhydramine hcl) .... Take 1 by mouth qd 8)  Claritin 10 Mg Tabs (Loratadine) .[name].. 1 by mouth two times a day 9)  Womens One Daily Tabs (Multiple vitamins-minerals) .... Take 1 by mouth qd 10)  Caltrate 600 1500 Mg Tabs (Calcium carbonate) .... Take 1 by mouth qd 11)  Triamcinolone Acetonide 0.5 % Crea (Triamcinolone acetonide) .... Apply to affected skin -3x daily 12)  Fluconazole 150 Mg Tabs (Fluconazole) .[name].. 1 by mouth once daily as needed for yeast 13)  Ciprofloxacin Hcl 500 Mg Tabs (Ciprofloxacin hcl) .[name].. 1po two times a day 14)  Medroxyprogesterone Acetate 2.5 Mg Tabs (Medroxyprogesterone acetate) .[name].. 1 times 10 days every month 15)  Vivelle-dot 0.1 Mg/24hr Pttw (Estradiol) .... 2 times a week 16)  Tramadol Hcl 50 Mg Tabs (Tramadol hcl) .[name].. 1 by mouth qid as needed 17)  Robaxin 500 Mg Tabs (Methocarbamol) .[name].. 1po two times a day as needed 18)  Nabumetone 750 Mg Tabs (Nabumetone) .[name].. 1po two times a day as needed 19)  Difluc 150  20)  Fluconazole 150 Mg Tabs (Fluconazole) .[name].. 1po q 3 days as needed 21)  Prednisone 10 Mg Tabs (Prednisone) .... 3po qd for 3days, then 2po qd for 3days, then 1po qd for 3days, then stop 22)  Hydrocodone-homatropine 5-1.5 Mg/63ml Syrp (Hydrocodone-homatropine) .[name].. 1 tsp by mouth q 6 hrs as needed cough 23)  Cephalexin 500 Mg Caps (Cephalexin) .[name].. 1 po three times a day  Other Orders: T-Vitamin D (25-Hydroxy) [id number])  Patient Instructions: 1)  Please take all new medications as prescribed  2)   Continue all previous medications as before this visit  3)  Please go to the Lab in the basement for your blood and/or urine tests today 4)  Please call the number on the [facility] Card for results of your testing  5)  Please call for your yearly pap smear and mammogram 6)  Please schedule a follow-up appointment in 1 year or sooner if needed Prescriptions: CEPHALEXIN 500 MG CAPS (CEPHALEXIN) 1 po three times a day  #30 x 0   Entered and Authorized by:   Corwin Levins MD   Signed by:   Corwin Levins MD on 07/12/2010   Method used:   Print then Give to Patient   RxID:   [id number] HYDROCODONE-HOMATROPINE 5-1.5 MG/5ML SYRP (HYDROCODONE-HOMATROPINE) 1 tsp by mouth q 6 hrs as needed cough  #6 oz x 1   Entered and Authorized by:   Corwin Levins MD   Signed by:   Corwin Levins MD on 07/12/2010   Method used:   Print then Give to Patient   RxID:   [id number] PREDNISONE 10 MG TABS (PREDNISONE) 3po qd for 3days, then 2po qd for 3days, then 1po qd for 3days, then stop  #18 x 0   Entered and Authorized by:   Corwin Levins MD   Signed by:   Corwin Levins MD on 07/12/2010   Method used:   Print then Give to Patient   RxID:   [id number] FLUCONAZOLE 150 MG TABS (FLUCONAZOLE) 1po q 3 days as needed  #2 x 1   Entered and Authorized by:   Corwin Levins MD   Signed by:   Corwin Levins MD on 07/12/2010   Method used:   Print then Give to Patient   RxID:   [id number] PROAIR HFA 108 (90 BASE) MCG/ACT AERS (ALBUTEROL SULFATE) 2 puff four times per day as needed  #1 x 11   Entered and Authorized by:   Corwin Levins MD   Signed by:   Corwin Levins MD on 07/12/2010   Method used:   Print then Give to Patient   RxID:  [id number] SYMBICORT 160-4.5 MCG/ACT AERO (BUDESONIDE-FORMOTEROL FUMARATE) 2 puffs two times a day  #1 x 11   Entered and Authorized by:   Corwin Levins MD   Signed by:   Corwin Levins MD on 07/12/2010   Method used:   Print then Give to Patient   RxID:   [id number]

## 2010-11-09 NOTE — Assessment & Plan Note (Signed)
Summary: SINUS/NWS   Vital Signs:  Patient profile:   55 year old female Height:      65 inches Weight:      203.50 pounds BMI:     33.99 O2 Sat:      99 % on Room air Temp:     98.3 degrees F oral Pulse rate:   87 / minute BP sitting:   110 / 74  (left arm) Cuff size:   large  Vitals Entered By: Zella Ball Ewing CMA Duncan Dull) (June 02, 2010 4:32 PM)  O2 Flow:  Room air CC: Sinus problems, headaches, left arm pain no better/RE   Primary Care Provider:  Corwin Levins MD  CC:  Sinus problems, headaches, and left arm pain no better/RE.  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  Overall good compliance with meds, and good tolerability. Does have signifcant nasal allergy symptoms but adequate control with meds.  Also concerned about bad breath and sour taste with sinus symptoms as well, but not sure how long this has been present  Problems Prior to Update: 1)  Facial Pain  (ICD-784.0) 2)  Frequency, Urinary  (ICD-788.41) 3)  Shoulder Pain, Left  (ICD-719.41) 4)  Arm Pain, Left  (ICD-729.5) 5)  Uri  (ICD-465.9) 6)  Vitamin D Deficiency  (ICD-268.9) 7)  Glucose Intolerance  (ICD-271.3) 8)  Uri  (ICD-465.9) 9)  Contact Dermatitis  (ICD-692.9) 10)  Chest Pain  (ICD-786.50) 11)  Unspecified Urticaria  (ICD-708.9) 12)  Lateral Epicondylitis, Right  (ICD-726.32) 13)  Preventive Health Care  (ICD-V70.0) 14)  Sinusitis- Acute-nos   (ICD-461.9) 15)  Chest Pain Unspecified  (ICD-786.50) 16)  Allergic Rhinitis  (ICD-477.9) 17)  Hypothyroidism  (ICD-244.9) 18)  Uti  (ICD-599.0) 19)  Herpes Zoster W/nervous Complication Nec  (ICD-053.19) 20)  Insomnia, Hx of  (ICD-V15.89) 21)  Anxiety  (ICD-300.00) 22)  Endometriosis Nos  (ICD-617.9) 23)  Herpes Simplex, Uncomplicated  (ICD-054.9) 24)  Obesity  (ICD-278.00) 25)  Asthma  (ICD-493.90) 26)  Hyperthyroidism  (ICD-242.90)  Medications Prior to Update: 1)  Symbicort 160-4.5 Mcg/act Aero (Budesonide-Formoterol Fumarate) .... 2 Puffs Two Times A Day 2)  Proair Hfa 108 (90 Base) Mcg/act Aers (Albuterol Sulfate) .... 2 Puff Four Times Per Day As Needed 3)  Levothyroxine Sodium 125 Mcg Tabs (Levothyroxine Sodium) .[name].. 1po Once Daily 4)  Hydroxyzine Hcl 10 Mg Tabs (Hydroxyzine Hcl) .... Take 1 By Mouth Qd 5)  Fluocinolone Acetonide 0.025 % Crea (Fluocinolone Acetonide) .... Use Prn 6)  Cervae Lotion .... Use Qd 7)  Benadryl 25 Mg Tabs (Diphenhydramine Hcl) .... Take 1 By Mouth Qd 8)  Claritin 10 Mg Tabs (Loratadine) .[name].. 1 By Mouth Two Times A Day 9)  Womens One Daily  Tabs (Multiple Vitamins-Minerals) .... Take 1 By Mouth Qd 10)  Caltrate 600 1500 Mg Tabs (Calcium Carbonate) .... Take 1 By Mouth Qd 11)  Triamcinolone Acetonide 0.5 % Crea (Triamcinolone Acetonide) .... Apply To Affected Skin -3x Daily 12)  Fluconazole 150 Mg Tabs (Fluconazole) .[name].. 1 By Mouth Once Daily As Needed For Yeast 13)  Ciprofloxacin Hcl 500 Mg Tabs (Ciprofloxacin Hcl) .[name].. 1po Two Times A Day 14)  Medroxyprogesterone Acetate 2.5 Mg Tabs (Medroxyprogesterone Acetate) .[name].. 1 Times 10 Days Every Month 15)  Vivelle-Dot 0.1 Mg/24hr Pttw (Estradiol) .... 2 Times A Week 16)  Tramadol Hcl 50 Mg Tabs (Tramadol Hcl) .[name].. 1 By Mouth Qid As Needed 17)  Robaxin 500 Mg Tabs (Methocarbamol) .[name].. 1po Two Times A Day As Needed 18)  Nabumetone 750 Mg Tabs (Nabumetone) .[name].. 1po Two Times A Day As Needed 19)  Difluc  150 20)  Fluconazole 150 Mg Tabs (Fluconazole) .[name].. 1po Q 3 Days As Needed  Current Medications (verified): 1)  Symbicort 160-4.5 Mcg/act Aero (Budesonide-Formoterol Fumarate) .... 2 Puffs Two Times A Day 2)  Proair Hfa 108 (90 Base) Mcg/act Aers (Albuterol Sulfate) .... 2 Puff Four Times Per Day As Needed 3)  Levothyroxine Sodium 125 Mcg Tabs (Levothyroxine Sodium) .[name].. 1po Once Daily 4)  Hydroxyzine Hcl 10 Mg Tabs (Hydroxyzine Hcl) .... Take 1 By Mouth Qd 5)  Fluocinolone Acetonide 0.025 % Crea (Fluocinolone Acetonide) .... Use Prn 6)  Cervae Lotion .... Use Qd 7)  Benadryl 25 Mg Tabs (Diphenhydramine Hcl) .... Take 1 By Mouth Qd 8)  Claritin 10 Mg Tabs (Loratadine) .[name].. 1 By Mouth Two Times A Day 9)  Womens One Daily  Tabs (Multiple Vitamins-Minerals) .... Take 1 By Mouth Qd 10)  Caltrate 600 1500 Mg Tabs (Calcium Carbonate) .... Take 1 By Mouth Qd 11)  Triamcinolone Acetonide 0.5 % Crea (Triamcinolone Acetonide) .... Apply To Affected Skin -3x Daily 12)  Fluconazole 150 Mg Tabs (Fluconazole) .[name].. 1 By Mouth Once Daily As Needed For Yeast 13)  Ciprofloxacin Hcl 500 Mg Tabs (Ciprofloxacin Hcl) .[name].. 1po Two Times A Day 14)  Medroxyprogesterone Acetate 2.5 Mg Tabs (Medroxyprogesterone Acetate) .[name].. 1 Times 10 Days Every Month 15)  Vivelle-Dot 0.1 Mg/24hr Pttw (Estradiol) .... 2 Times A Week 16)  Tramadol Hcl 50 Mg Tabs (Tramadol Hcl) .[name].. 1 By Mouth Qid As Needed 17)  Robaxin 500 Mg Tabs (Methocarbamol) .[name].. 1po Two Times A Day As Needed 18)  Nabumetone 750 Mg Tabs (Nabumetone) .[name].. 1po Two Times A Day As Needed 19)  Difluc 150 20)  Fluconazole 150 Mg Tabs (Fluconazole) .[name].. 1po Q 3 Days As Needed  Allergies (verified): 1)  ! Flagyl 2)  ! * Z Pack 3)  ! 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   Head:  normocephalic and atraumatic.   Eyes:  vision grossly intact, pupils equal, and pupils round.   Ears:  R ear normal.  , left tm mild erythema, canals clear, sinus nontender bilat maxiallary  Nose:  nasal dischargemucosal pallor and mucosal edema.   Mouth:  pharyngeal erythema and fair dentition.   Neck:  supple and no masses.   Lungs:  normal respiratory effort and normal breath sounds.   Heart:  normal rate and regular rhythm.   Abdomen:  soft, non-tender, and normal bowel sounds.   Msk:  left shoulder with FROM and no swelling, but has mild tender to bicipital tendon insertion site Extremities:  no edema, no erythema  Neurologic:  cranial nerves II-XII intact and strength normal in all extremities.     Impression & Recommendations:  Problem # 1:  FACIAL PAIN (ICD-784.0)  Her updated medication list for this problem includes:    Tramadol Hcl 50 Mg Tabs (Tramadol hcl) .[name]... 1 by mouth qid as needed    Nabumetone 750 Mg Tabs (Nabumetone) .[name]... 1po two times a day as needed with bad taste and smell, and pain with essentially benign exam - suspect more chronic sinusitis - will check CT sinus, no CM;  at this point she should be able to proceed with the dental surgury for Fri AM; cont doxy as she has for now, as well as the allegra;  would not start flonase at this point until more is known regarding ? infection  Orders: Radiology Referral (Radiology)  Problem # 2:   SHOULDER PAIN, LEFT (ICD-719.41)  Her updated medication list for this problem includes:    Tramadol Hcl 50 Mg Tabs (Tramadol hcl) .[name]... 1 by mouth qid as needed    Robaxin 500 Mg Tabs (Methocarbamol) .[name]... 1po two times a day as needed    Nabumetone 750 Mg Tabs (Nabumetone) .[name]... 1po two times a day as needed persists, would hold on prednisone pack trial due to the above;  Continue all previous medications as before this visit , overall mlld persistnet likely bicipital tendonitis by exam - pt to call if wishes ortho referral  Problem # 3:  ALLERGIC RHINITIS (ICD-477.9) stable overall by hx and exam, ok to continue meds/tx as is  Her updated medication list for this problem includes:    Benadryl 25 Mg Tabs (Diphenhydramine hcl) .[name]... Take 1 by mouth qd    Claritin 10 Mg Tabs (Loratadine) .[name]... 1 by mouth two times a day  Problem # 4:  ANXIETY (ICD-300.00) Assessment: Unchanged  Her updated medication list for this problem includes:    Hydroxyzine Hcl 10 Mg Tabs (Hydroxyzine hcl) .[name]... Take 1 by mouth qd mild increase for unclear reason, Continue all previous medications as before this visit    Complete Medication List: 1)  Symbicort 160-4.5 Mcg/act Aero (Budesonide-formoterol fumarate) .... 2 puffs two times a day 2)  Proair Hfa 108 (90 Base) Mcg/act Aers (Albuterol sulfate) .... 2 puff four times per day as needed 3)  Levothyroxine Sodium 125 Mcg Tabs (Levothyroxine sodium) .[name].. 1po once daily 4)  Hydroxyzine Hcl 10 Mg Tabs (Hydroxyzine hcl) .... Take 1 by mouth qd 5)  Fluocinolone Acetonide 0.025 % Crea (Fluocinolone acetonide) .... Use prn 6)  Cervae Lotion  .... Use qd 7)  Benadryl 25 Mg Tabs (Diphenhydramine hcl) .... Take 1 by mouth qd 8)  Claritin 10 Mg Tabs (Loratadine) .[name].. 1 by mouth two times a day 9)  Womens One Daily Tabs (Multiple vitamins-minerals) .... Take 1 by mouth qd 10)  Caltrate 600 1500 Mg Tabs (Calcium carbonate) .... Take 1 by mouth qd 11)  Triamcinolone  Acetonide 0.5 % Crea (Triamcinolone acetonide) .... Apply to affected skin -3x daily 12)  Fluconazole 150 Mg Tabs (Fluconazole) .[name].. 1 by mouth once daily as needed for yeast 13)  Ciprofloxacin Hcl 500 Mg Tabs (Ciprofloxacin hcl) .[name].. 1po two times  a day 14)  Medroxyprogesterone Acetate 2.5 Mg Tabs (Medroxyprogesterone acetate) .[name].. 1 times 10 days every month 15)  Vivelle-dot 0.1 Mg/24hr Pttw (Estradiol) .... 2 times a week 16)  Tramadol Hcl 50 Mg Tabs (Tramadol hcl) .[name].. 1 by mouth qid as needed 17)  Robaxin 500 Mg Tabs (Methocarbamol) .[name].. 1po two times a day as needed 18)  Nabumetone 750 Mg Tabs (Nabumetone) .[name].. 1po two times a day as needed 19)  Difluc 150  20)  Fluconazole 150 Mg Tabs (Fluconazole) .[name].. 1po q 3 days as needed  Patient Instructions: 1)  Continue all previous medications as before this visit 2)  You will be contacted about the referral(s) to: CT scan 3)  Please call if you feel you need the referral to orthopedic 4)  Please schedule a follow-up appointment as needed.

## 2010-11-09 NOTE — Progress Notes (Signed)
Summary: Yeast Inf  Phone Note Call from Patient   Caller: Patient [phone] Summary of Call: Pt called stating that ABX Cipro has caused a yeast inf. Pt is requesting Diflucan Rx to [facility] Initial call taken by: Margaret Pyle, CMA,  May 24, 2010 1:39 PM  Follow-up for Phone Call        Pt informed Follow-up by: Margaret Pyle, CMA,  May 24, 2010 3:34 PM    New/Updated Medications: * DIFLUC 150  FLUCONAZOLE 150 MG TABS (FLUCONAZOLE) 1po q 3 days as needed Prescriptions: FLUCONAZOLE 150 MG TABS (FLUCONAZOLE) 1po q 3 days as needed  #2 x 1   Entered and Authorized by:   Corwin Levins MD   Signed by:   Corwin Levins MD on 05/24/2010   Method used:   Electronically to        [facility] [address] (retail)       [phone]       [place], [place]  [address]       Ph: [phone]       Fax: [phone]   RxID:   [id number]  done escript Corwin Levins MD  May 24, 2010 3:20 PM

## 2010-11-09 NOTE — Assessment & Plan Note (Signed)
Summary: PAIN IN L ARM AND SHOULDER/NWS   Vital Signs:  Patient profile:   55 year old female Height:      66 inches Weight:      202 pounds BMI:     32.72 O2 Sat:      97 % on Room air Temp:     98.5 degrees F oral BP sitting:   118 / 78  (left arm) Cuff size:   regular  Vitals Entered By: Zella Ball Ewing CMA Duncan Dull) (May 17, 2010 11:46 AM)  O2 Flow:  Room air CC: Left arm and shoulder pain, urimating more frequently/RE   Primary Care Provider:  Corwin Levins MD  CC:  Left arm and shoulder pain and urimating more frequently/RE.  History of Present Illness: here with 2 to 3 wks graually worsening left shoulder pain after moving her office to her home to work at home;  uses laptop for work on [employer] table with different ergonomics with increased strain to the [employer];  also pain to drive, pain now moderate to severe despite the icy hot, apercreme, and alleve otc; ; overall soreness is to the post left arm, shoudler and upper back near the shoulder blade, not overly worse to raise the arm above her head , some pain can radiate to the arm below the elbow;  drives with left arm but not able to as it seems to pull on the back of the neck and shoulder;  no numbness or weakness except with the pain;  missed work today due to pain;    also with freq urination for 4 wks; worse in the few days,  did see GYN DR Mitzi Hansen - treated with antibx and did not seem to help with the first antibx, then cipro after that helped.  Was suggested to see urology - and was referred - has appt but not 2 more wks.  No overt hematuria, dysuria but does have urgency, and occasiona lincont - wears pads  Problems Prior to Update: 1)  Frequency, Urinary  (ICD-788.41) 2)  Shoulder Pain, Left  (ICD-719.41) 3)  Arm Pain, Left  (ICD-729.5) 4)  Uri  (ICD-465.9) 5)  Vitamin D Deficiency  (ICD-268.9) 6)  Glucose Intolerance  (ICD-271.3) 7)  Uri  (ICD-465.9) 8)  Contact Dermatitis  (ICD-692.9) 9)  Chest Pain   (ICD-786.50) 10)  Unspecified Urticaria  (ICD-708.9) 11)  Lateral Epicondylitis, Right  (ICD-726.32) 12)  Preventive Health Care  (ICD-V70.0) 13)  Sinusitis- Acute-nos  (ICD-461.9) 14)  Chest Pain Unspecified  (ICD-786.50) 15)  Allergic Rhinitis  (ICD-477.9) 16)  Hypothyroidism  (ICD-244.9) 17)  Uti  (ICD-599.0) 18)  Herpes Zoster W/nervous Complication Nec  (ICD-053.19) 19)  Insomnia, Hx of  (ICD-V15.89) 20)  Anxiety  (ICD-300.00) 21)  Endometriosis Nos  (ICD-617.9) 22)  Herpes Simplex, Uncomplicated  (ICD-054.9) 23)  Obesity  (ICD-278.00) 24)  Asthma  (ICD-493.90) 25)  Hyperthyroidism  (ICD-242.90)  Medications Prior to Update: 1)  Symbicort 160-4.5 Mcg/act Aero (Budesonide-Formoterol Fumarate) .... 2 Puffs Two Times A Day 2)  Proair Hfa 108 (90 Base) Mcg/act Aers (Albuterol Sulfate) .... 2 Puff Four Times Per Day As Needed 3)  Levothyroxine Sodium 125 Mcg Tabs (Levothyroxine Sodium) .[name].. 1po Once Daily 4)  Hydroxyzine Hcl 10 Mg Tabs (Hydroxyzine Hcl) .... Take 1 By Mouth Qd 5)  Fluocinolone Acetonide 0.025 % Crea (Fluocinolone Acetonide) .... Use Prn 6)  Cervae Lotion .... Use Qd 7)  Benadryl 25 Mg Tabs (Diphenhydramine Hcl) .... Take 1 By Mouth  Qd 8)  Claritin 10 Mg Tabs (Loratadine) .[name].. 1 By Mouth Two Times A Day 9)  Womens One Daily  Tabs (Multiple Vitamins-Minerals) .... Take 1 By Mouth Qd 10)  Caltrate 600 1500 Mg Tabs (Calcium Carbonate) .... Take 1 By Mouth Qd 11)  Triamcinolone Acetonide 0.5 % Crea (Triamcinolone Acetonide) .... Apply To Affected Skin -3x Daily 12)  Fluconazole 150 Mg Tabs (Fluconazole) .[name].. 1 By Mouth Once Daily As Needed For Yeast 13)  Cephalexin 500 Mg Caps (Cephalexin) .[name].. 1 By Mouth Three Times A Day 14)  Medroxyprogesterone Acetate 2.5 Mg Tabs (Medroxyprogesterone Acetate) .[name].. 1 Times 10 Days Every Month 15)  Vivelle-Dot 0.1 Mg/24hr Pttw (Estradiol) .... 2 Times A Week 16)  Tramadol Hcl 50 Mg Tabs (Tramadol Hcl) .[name].. 1 By Mouth Qid As Needed 17)   Flexeril 5 Mg Tabs (Cyclobenzaprine Hcl) .[name].. 1 By Mouth Three Times A Day  Current Medications (verified): 1)  Symbicort 160-4.5 Mcg/act Aero (Budesonide-Formoterol Fumarate) .... 2 Puffs Two Times A Day 2)  Proair Hfa 108 (90 Base) Mcg/act Aers (Albuterol Sulfate) .... 2 Puff Four Times Per Day As Needed 3)  Levothyroxine Sodium 125 Mcg Tabs (Levothyroxine Sodium) .[name].. 1po Once Daily 4)  Hydroxyzine Hcl 10 Mg Tabs (Hydroxyzine Hcl) .... Take 1 By Mouth Qd 5)  Fluocinolone Acetonide 0.025 % Crea (Fluocinolone Acetonide) .... Use Prn 6)  Cervae Lotion .... Use Qd 7)  Benadryl 25 Mg Tabs (Diphenhydramine Hcl) .... Take 1 By Mouth Qd 8)  Claritin 10 Mg Tabs (Loratadine) .[name].. 1 By Mouth Two Times A Day 9)  Womens One Daily  Tabs (Multiple Vitamins-Minerals) .... Take 1 By Mouth Qd 10)  Caltrate 600 1500 Mg Tabs (Calcium Carbonate) .... Take 1 By Mouth Qd 11)  Triamcinolone Acetonide 0.5 % Crea (Triamcinolone Acetonide) .... Apply To Affected Skin -3x Daily 12)  Fluconazole 150 Mg Tabs (Fluconazole) .[name].. 1 By Mouth Once Daily As Needed For Yeast 13)  Ciprofloxacin Hcl 500 Mg Tabs (Ciprofloxacin Hcl) .[name].. 1po Two Times A Day 14)  Medroxyprogesterone Acetate 2.5 Mg Tabs (Medroxyprogesterone Acetate) .[name].. 1 Times 10 Days Every Month 15)  Vivelle-Dot 0.1 Mg/24hr Pttw (Estradiol) .... 2 Times A Week 16)  Tramadol Hcl 50 Mg Tabs (Tramadol Hcl) .[name].. 1 By Mouth Qid As Needed 17)  Robaxin 500 Mg Tabs (Methocarbamol) .[name].. 1po Two Times A Day As Needed 18)  Nabumetone 750 Mg Tabs (Nabumetone) .[name].. 1po Two Times A Day As Needed  Allergies (verified): 1)  ! Flagyl 2)  ! * Z Pack 3)  ! 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   Head:  normocephalic and atraumatic.   Eyes:  vision grossly intact, pupils equal, and pupils round.   Ears:  R ear normal and L ear normal.   Nose:  no external deformity and no nasal discharge.   Mouth:  no gingival abnormalities and pharynx pink and moist.  Neck:  supple and no masses.   Lungs:  normal respiratory effort and normal breath sounds.   Heart:  normal rate and regular rhythm.   Abdomen:  soft, non-tender, and normal bowel sounds.   Msk:  mild tender tricep area and deltoid but o/w NT upper back , shoulder and shoudler with FROM, no sweling or rash Extremities:  no edema, no erythema  Neurologic:  cranial nerves II-XII intact, strength normal in all extremities, sensation intact to light touch, and DTRs symmetrical and normal.     Impression & Recommendations:  Problem # 1:  SHOULDER PAIN, LEFT (ICD-719.41)  Her updated medication list for this problem includes:    Tramadol Hcl 50 Mg Tabs (Tramadol hcl) .[name]... 1 by mouth qid as needed    Robaxin 500 Mg Tabs (Methocarbamol) .[name]... 1po two times a day as needed    Nabumetone 750 Mg Tabs (Nabumetone) .[name]... 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 2 puffs two times a day 2)  Proair Hfa 108 (90 Base) Mcg/act Aers (Albuterol sulfate) .... 2 puff four times per day as needed 3)  Levothyroxine Sodium 125 Mcg Tabs (Levothyroxine sodium) .[name].. 1po once daily 4)  Hydroxyzine Hcl 10 Mg Tabs (Hydroxyzine hcl) .... Take 1 by mouth qd 5)  Fluocinolone Acetonide 0.025 % Crea (Fluocinolone acetonide) .... Use prn 6)  Cervae Lotion  .... Use qd 7)  Benadryl 25 Mg Tabs (Diphenhydramine hcl) .... Take 1 by mouth qd 8)  Claritin 10 Mg Tabs (Loratadine) .[name].. 1 by mouth two times a day 9)  Womens One Daily Tabs (Multiple vitamins-minerals) .... Take 1 by mouth qd 10)  Caltrate 600 1500 Mg Tabs (Calcium carbonate) .... Take 1 by mouth qd 11)  Triamcinolone Acetonide 0.5 % Crea (Triamcinolone acetonide) .... Apply to affected skin -3x daily 12)  Fluconazole 150 Mg Tabs (Fluconazole) .[name].. 1 by mouth once daily as needed for yeast 13)  Ciprofloxacin Hcl 500 Mg Tabs (Ciprofloxacin hcl) .[name].. 1po two times a day 14)  Medroxyprogesterone Acetate 2.5 Mg Tabs (Medroxyprogesterone acetate) .[name].. 1 times 10 days every month 15)  Vivelle-dot 0.1 Mg/24hr Pttw (Estradiol) .... 2 times a week 16)  Tramadol Hcl 50 Mg Tabs (Tramadol hcl) .[name].. 1 by mouth qid as needed 17)  Robaxin 500 Mg Tabs (Methocarbamol) .[name].. 1po two times a day as needed 18)  Nabumetone 750 Mg Tabs (Nabumetone) .[name].. 1po two times a day as needed  Other Orders: UA Dipstick W/ Micro (manual) ([id number])  Patient Instructions: 1)  Please take all new medications as prescribed 2)  Continue all previous medications as before this visit 3)  Your urine will be sent for culture today 4)  Please schedule a follow-up appointment in 6 months with CPX labs if OK with your insurance    Prescriptions: ROBAXIN 500 MG TABS (METHOCARBAMOL) 1po two times a day as needed  #60 x 2  Entered and Authorized by:   Corwin Levins MD   Signed by:   Corwin Levins MD on 05/17/2010   Method used:   Print then Give to Patient   RxID:   [id number] CIPROFLOXACIN HCL 500 MG TABS (CIPROFLOXACIN HCL) 1po two times a day  #20 x 0   Entered and Authorized by:   Corwin Levins MD   Signed by:   Corwin Levins MD on 05/17/2010   Method used:   Print then Give to Patient   RxID:   [id number] NABUMETONE 750 MG TABS (NABUMETONE) 1po two times a day as needed  #60 x 2   Entered and Authorized by:   Corwin Levins MD   Signed by:   Corwin Levins MD on 05/17/2010   Method used:   Print then Give to Patient   RxID:   [id number]   Laboratory Results   Urine Tests    Routine Urinalysis   Color: lt. yellow Appearance: Clear Glucose: negative   (Normal Range: Negative) Bilirubin: negative   (Normal Range: Negative) Ketone: negative   (Normal Range: Negative) Spec. Gravity: <1.005   (Normal Range: 1.003-1.035) Blood: trace-lysed   (Normal Range: Negative) pH: 5.0   (Normal Range: 5.0-8.0) Protein: negative   (Normal Range: Negative) Urobilinogen: 0.2   (Normal Range: 0-1) Nitrite: negative   (Normal Range: Negative) Leukocyte Esterace: negative   (Normal Range: Negative)

## 2010-11-10 ENCOUNTER — Ambulatory Visit (INDEPENDENT_AMBULATORY_CARE_PROVIDER_SITE_OTHER): Payer: BC Managed Care – PPO | Admitting: Internal Medicine

## 2010-11-10 ENCOUNTER — Encounter: Payer: Self-pay | Admitting: Internal Medicine

## 2010-11-10 DIAGNOSIS — L989 Disorder of the skin and subcutaneous tissue, unspecified: Secondary | ICD-10-CM

## 2010-11-10 DIAGNOSIS — M542 Cervicalgia: Secondary | ICD-10-CM

## 2010-11-10 DIAGNOSIS — J019 Acute sinusitis, unspecified: Secondary | ICD-10-CM

## 2010-11-10 DIAGNOSIS — F411 Generalized anxiety disorder: Secondary | ICD-10-CM

## 2010-11-10 HISTORY — DX: Disorder of the skin and subcutaneous tissue, unspecified: L98.9

## 2010-11-10 HISTORY — DX: Cervicalgia: M54.2

## 2010-11-11 NOTE — Assessment & Plan Note (Signed)
Summary: BETWEEN SHOULDER AND NECK LUMP-DR JWJ PT/NO CLINIC  STC   Vital Signs:  Patient profile:   55 year old female Height:      65 inches (165.10 cm) Weight:      205 pounds (93.18 kg) O2 Sat:      98 % on Room air Temp:     97.5 degrees F (36.39 degrees C) oral Pulse rate:   73 / minute BP sitting:   130 / 86  (left arm) Cuff size:   regular  Vitals Entered By: Orlan Leavens RMA (October 22, 2010 4:16 PM)  O2 Flow:  Room air CC: Headache/ Lump between neck and shoulder Is Patient Diabetic? No Pain Assessment Patient in pain? no        Primary Care Provider:  Corwin Levins MD  CC:  Headache/ Lump between neck and shoulder.  History of Present Illness: c/o sinus and allergy pressure - no fever but thick yellow discharge from nose +ST and HA, left frontal region  not improved with otc meds  also posterior left neck pain noted "lump" there by her massage therapist 1 week ago at this site not changed in size - no trauma or numbness no other skin lumps  Clinical Review Panels:  CBC   WBC:  6.7 (08/02/2010)   RBC:  4.21 (08/02/2010)   Hgb:  13.3 (08/02/2010)   Hct:  38.1 (08/02/2010)   Platelets:  246.0 (08/02/2010)   MCV  90.6 (08/02/2010)   MCHC  34.9 (08/02/2010)   RDW  13.3 (08/02/2010)   PMN:  65.8 (08/02/2010)   Lymphs:  25.3 (08/02/2010)   Monos:  7.4 (08/02/2010)   Eosinophils:  1.1 (08/02/2010)   Basophil:  0.4 (08/02/2010)  Complete Metabolic Panel   Glucose:  67 (08/02/2010)   Sodium:  139 (08/02/2010)   Potassium:  4.1 (08/02/2010)   Chloride:  105 (08/02/2010)   CO2:  27 (08/02/2010)   BUN:  11 (08/02/2010)   Creatinine:  0.9 (08/02/2010)   Albumin:  3.7 (08/02/2010)   Total Protein:  6.7 (08/02/2010)   Calcium:  9.5 (08/02/2010)   Total Bili:  0.6 (08/02/2010)   Alk Phos:  67 (08/02/2010)   SGPT (ALT):  16 (08/02/2010)   SGOT (AST):  19 (08/02/2010)   Current Medications (verified): 1)  Symbicort 160-4.5 Mcg/act Aero  (Budesonide-Formoterol Fumarate) .... 2 Puffs Two Times A Day 2)  Proair Hfa 108 (90 Base) Mcg/act Aers (Albuterol Sulfate) .... 2 Puff Four Times Per Day As Needed 3)  Levothyroxine Sodium 125 Mcg Tabs (Levothyroxine Sodium) .[name].. 1po Once Daily 4)  Benadryl 25 Mg Tabs (Diphenhydramine Hcl) .... Take 1 By Mouth Qd 5)  Womens One Daily  Tabs (Multiple Vitamins-Minerals) .... Take 1 By Mouth Qd 6)  Caltrate 600 1500 Mg Tabs (Calcium Carbonate) .... Take 1 By Mouth Qd 7)  Fluconazole 150 Mg Tabs (Fluconazole) .[name].. 1 By Mouth Once Daily As Needed For Yeast 8)  Tramadol Hcl 50 Mg Tabs (Tramadol Hcl) .[name].. 1 By Mouth Qid As Needed 9)  Robaxin 500 Mg Tabs (Methocarbamol) .[name].. 1po Two Times A Day As Needed 10)  Nabumetone 750 Mg Tabs (Nabumetone) .[name].. 1po Two Times A Day As Needed 11)  Allegra Allergy 180 Mg Tabs (Fexofenadine Hcl) .... Take 1 By Mouth Once Daily  Allergies (verified): 1)  ! Flagyl 2)  ! Biaxin  Past History:  Past Medical History: Asthma Obesity Herpes, oral Hx  Endometriosis Anxiety Insomnia Shingles 1/07  Hypothyroidism - per endocrinology  Allergic rhinitis c-spine DJD/DDD glucose intolerance  Review of Systems  The patient denies fever, weight loss, hoarseness, chest pain, and syncope.    Physical Exam  General:  alert and overweight-appearing.  nontoxic Eyes:  vision grossly intact, pupils equal, and pupils round.   Ears:  bilat tm's clear, no effusion, min sinus tender over left maxillary Mouth:  pharyngeal erythema and fair dentition.   Lungs:  normal respiratory effort, no intercostal retractions or use of accessory muscles; normal breath sounds bilaterally - no crackles and no wheezes.    Heart:  normal rate, regular rhythm, no murmur, and no rub. BLE without edema. Skin:  1x2cm lipoma subcut region of left posterior neck/upper shoulder -  Psych:  min anxious appearing and not depressed appearing.     Impression & Recommendations:  Problem # 1:   SINUSITIS- ACUTE-NOS (ICD-461.9)  The following medications were removed from the medication list:    Ciprofloxacin Hcl 500 Mg Tabs (Ciprofloxacin hcl) .[name]... 1po two times a day    Hydrocodone-homatropine 5-1.5 Mg/4ml Syrp (Hydrocodone-homatropine) .[name]... 1 tsp by mouth q 6 hrs as needed cough    Cephalexin 500 Mg Caps (Cephalexin) .[name]... 1 po three times a day Her updated medication list for this problem includes:    Azithromycin 250 Mg Tabs (Azithromycin) .[name]... 2 tabs by mouth today, then 1 by mouth daily starting tomorrow - take with food to prevent nausea    Nasonex 50 Mcg/act Susp (Mometasone furoate) .[name]... 2 sprays each nostril  every morning for sinus symptoms  Instructed on treatment. Call if symptoms persist or worsen.   Orders: Prescription Created Electronically [id number])  Problem # 2:  LIPOMA (ICD-214.9) reassurance provided re: dx muscle relaxant for assoc spasm causing posterior neck tightness and discomfort to f/u dr. [name] if inc size or cont discomfort to consider surg removal  Complete Medication List: 1)  Symbicort 160-4.5 Mcg/act Aero (Budesonide-formoterol fumarate) .... 2 puffs two times a day 2)  Proair Hfa 108 (90 Base) Mcg/act Aers (Albuterol sulfate) .... 2 puff four times per day as needed 3)  Levothyroxine Sodium 125 Mcg Tabs (Levothyroxine sodium) .[name].. 1po once daily 4)  Benadryl 25 Mg Tabs (Diphenhydramine hcl) .... Take 1 by mouth qd 5)  Womens One Daily Tabs (Multiple vitamins-minerals) .... Take 1 by mouth qd 6)  Caltrate 600 1500 Mg Tabs (Calcium carbonate) .... Take 1 by mouth qd 7)  Fluconazole 150 Mg Tabs (Fluconazole) .[name].. 1 by mouth once daily as needed for yeast 8)  Tramadol Hcl 50 Mg Tabs (Tramadol hcl) .[name].. 1 by mouth qid as needed 9)  Allegra Allergy 180 Mg Tabs (Fexofenadine hcl) .... Take 1 by mouth once daily 10)  Flexeril 10 Mg Tabs (Cyclobenzaprine hcl) .... 1/2-1 by mouth at bedtime x 7nights and every 12hours as needed for muscle spasm  pain 11)  Azithromycin 250 Mg Tabs (Azithromycin) .... 2 tabs by mouth today, then 1 by mouth daily starting tomorrow - take with food to prevent nausea 12)  Nasonex 50 Mcg/act Susp (Mometasone furoate) .... 2 sprays each nostril  every morning for sinus symptoms  Patient Instructions: 1)  it was good to see you today. 2)  azithromycin antibiotics and nasonex nose spray for sinus symptoms  3)  Flexeril for muscle spasm and pain 4)  your prescriptions have been electronically submitted to your pharmacy. Please take as directed. Contact our office if you believe you're having problems with the medication(s).  5)  your shoulder "knot" is a LIPOMA -  there is nothing bad or dangerous about a lipoma - followup with dr. [name] if the lipoma is getting bigger or causing neck pain and he can refer you to a surgeon to cut it out if needed 6)  Please schedule an appointment with your primary doctor in : 2-3 months to review lipoma and sinus/allergy symptoms  Prescriptions: NASONEX 50 MCG/ACT SUSP (MOMETASONE FUROATE) 2 sprays each nostril  every morning for sinus symptoms  #1 x 1   Entered and Authorized by:   Newt Lukes MD   Signed by:   Newt Lukes MD on 10/22/2010   Method used:   Electronically to        [facility] [address] (retail)       [phone]       [place], [place]  [address]       Ph: [phone]       Fax: [phone]   RxID:   [id number] AZITHROMYCIN 250 MG TABS (AZITHROMYCIN) 2 tabs by mouth today, then 1 by mouth daily starting tomorrow - TAKE WITH FOOD to prevent nausea  #6 x 0   Entered and Authorized by:   Newt Lukes MD   Signed by:   Newt Lukes MD on 10/22/2010   Method used:   Electronically to        [facility] [address] (retail)       [phone]       [place], [place]  [address]       Ph: [phone]       Fax: [phone]   RxID:   [id number] FLEXERIL 10  MG TABS (CYCLOBENZAPRINE HCL) 1/2-1 by mouth at bedtime x 7nights and every 12hours as needed for muscle spasm pain  #30 x 1   Entered and Authorized by:   Newt Lukes MD   Signed by:   Newt Lukes MD on 10/22/2010   Method used:   Electronically to        [facility] [address] (retail)       [phone]       [place], [place]  [address]       Ph: [phone]       Fax: [phone]   RxID:   [id number]    Orders Added: 1)  Est. Patient Level IV [71696] 2)  Prescription Created Electronically [phone]

## 2010-11-17 NOTE — Assessment & Plan Note (Signed)
Summary: FU  FROM JAN---STC   Vital Signs:  Patient profile:   55 year old female Height:      65.5 inches Weight:      203 pounds BMI:     33.39 O2 Sat:      99 % on Room air Temp:     98.5 degrees F oral Pulse rate:   89 / minute BP sitting:   100 / 70  (left arm) Cuff size:   large  Vitals Entered By: Zella Ball Ewing CMA Duncan Dull) (November 10, 2010 2:42 PM)  O2 Flow:  Room air CC: Followup/RE   Primary Care Provider:  Corwin Levins MD  CC:  Followup/RE.  History of Present Illness: here to f/u- overall doing ok, sinus symptoms improved and denies facial pain, pressure, fever and congestion/colored d/c, though still has occasional HA from last visit better with occasional excedrin;  does also have post neck pain diffusely bilat, more to the lower neck than upper neck and no symptoms c/w occipital neuralgia; and no radicalar symptoms to either extremity  - no UE pain/weakness/numb,  no bowel or bladder change, no fever, unintentional wt loss, gait change or falls.  Overall pain intermittent for many months, mild, and some improved with the flexeril from last visit, and worse to recurring flexion/extension movements, midl to mod in the past several months.      also mentions the lesion just under the skin ot the left upper back near the base of the neck  - ? c/w lipoma per Dr Trish Mage, but pt asks again today;  she is worried may have something to do with the neck pain, and has been noticed by other who did a neck massage recent;  pt does not think enlarged since last visit but overall ? enalrging in the past few months? No other lumps under the skin.  No fever, wt loss, night sweats, loss of appetite or other constitutional symptoms  Overall good compliance with meds, and good tolerability. Denies worsening depressive symptoms, suicidal ideation, or panic, though has has ongoing anxiety, mild overall recently with recent incrasd stressors.    Problems Prior to Update: 1)  Neck Pain   (ICD-723.1) 2)  Skin Lesion  (ICD-709.9) 3)  Lipoma  (ICD-214.9) 4)  Preventive Health Care  (ICD-V70.0) 5)  Wheezing  (ICD-786.07) 6)  Facial Pain  (ICD-784.0) 7)  Frequency, Urinary  (ICD-788.41) 8)  Shoulder Pain, Left  (ICD-719.41) 9)  Arm Pain, Left  (ICD-729.5) 10)  Uri  (ICD-465.9) 11)  Vitamin D Deficiency  (ICD-268.9) 12)  Glucose Intolerance  (ICD-271.3) 13)  Uri  (ICD-465.9) 14)  Contact Dermatitis  (ICD-692.9) 15)  Chest Pain  (ICD-786.50) 16)  Unspecified Urticaria  (ICD-708.9) 17)  Lateral Epicondylitis, Right  (ICD-726.32) 18)  Preventive Health Care  (ICD-V70.0) 19)  Sinusitis- Acute-nos  (ICD-461.9) 20)  Chest Pain Unspecified  (ICD-786.50) 21)  Allergic Rhinitis  (ICD-477.9) 22)  Hypothyroidism  (ICD-244.9) 23)  Uti  (ICD-599.0) 24)  Herpes Zoster W/nervous Complication Nec  (ICD-053.19) 25)  Insomnia, Hx of  (ICD-V15.89) 26)  Anxiety  (ICD-300.00) 27)  Endometriosis Nos  (ICD-617.9) 28)  Herpes Simplex, Uncomplicated  (ICD-054.9) 29)  Obesity  (ICD-278.00) 30)  Asthma  (ICD-493.90) 31)  Hyperthyroidism  (ICD-242.90)  Medications Prior to Update: 1)  Symbicort 160-4.5 Mcg/act Aero (Budesonide-Formoterol Fumarate) .... 2 Puffs Two Times A Day 2)  Proair Hfa 108 (90 Base) Mcg/act Aers (Albuterol Sulfate) .... 2 Puff Four Times Per Day As Needed  3)  Levothyroxine Sodium 125 Mcg Tabs (Levothyroxine Sodium) .[name].. 1po Once Daily 4)  Benadryl 25 Mg Tabs (Diphenhydramine Hcl) .... Take 1 By Mouth Qd 5)  Womens One Daily  Tabs (Multiple Vitamins-Minerals) .... Take 1 By Mouth Qd 6)  Caltrate 600 1500 Mg Tabs (Calcium Carbonate) .... Take 1 By Mouth Qd 7)  Fluconazole 150 Mg Tabs (Fluconazole) .[name].. 1 By Mouth Once Daily As Needed For Yeast 8)  Tramadol Hcl 50 Mg Tabs (Tramadol Hcl) .[name].. 1 By Mouth Qid As Needed 9)  Allegra Allergy 180 Mg Tabs (Fexofenadine Hcl) .... Take 1 By Mouth Once Daily 10)  Flexeril 10 Mg Tabs (Cyclobenzaprine Hcl) .... 1/2-1 By Mouth At  Bedtime X 7nights and Every 12hours As Needed For Muscle Spasm Pain 11)  Nasonex 50 Mcg/act Susp (Mometasone Furoate) .... 2 Sprays Each Nostril  Every Morning For Sinus Symptoms  Current Medications (verified): 1)  Symbicort 160-4.5 Mcg/act Aero (Budesonide-Formoterol Fumarate) .... 2 Puffs Two Times A Day 2)  Proair Hfa 108 (90 Base) Mcg/act Aers (Albuterol Sulfate) .... 2 Puff Four Times Per Day As Needed 3)  Levothyroxine Sodium 125 Mcg Tabs (Levothyroxine Sodium) .[name].. 1po Once Daily 4)  Benadryl 25 Mg Tabs (Diphenhydramine Hcl) .... Take 1 By Mouth Qd 5)  Womens One Daily  Tabs (Multiple Vitamins-Minerals) .... Take 1 By Mouth Qd 6)  Caltrate 600 1500 Mg Tabs (Calcium Carbonate) .... Take 1 By Mouth Qd 7)  Fluconazole 150 Mg Tabs (Fluconazole) .[name].. 1 By Mouth Once Daily As Needed For Yeast 8)  Tramadol Hcl 50 Mg Tabs (Tramadol Hcl) .[name].. 1 By Mouth Qid As Needed 9)  Allegra Allergy 180 Mg Tabs (Fexofenadine Hcl) .... Take 1 By Mouth Once Daily 10)  Flexeril 10 Mg Tabs (Cyclobenzaprine Hcl) .... 1/2-1 By Mouth At Bedtime X 7nights and Every 12hours As Needed For Muscle Spasm Pain 11)  Nasonex 50 Mcg/act Susp (Mometasone Furoate) .... 2 Sprays Each Nostril  Every Morning For Sinus Symptoms 12)  Naproxen 500 Mg Tabs (Naproxen) .[name].. 1 By Mouth Two Times A Day As Needed Pain  Allergies (verified): 1)  ! Flagyl 2)  ! Biaxin  Past History:  Past Medical History: Last updated: 10/22/2010 Asthma Obesity Herpes, oral Hx  Endometriosis Anxiety Insomnia Shingles 1/07  Hypothyroidism - per endocrinology Allergic rhinitis c-spine DJD/DDD glucose intolerance  Past Surgical History: Last updated: 05/09/2008 Laminectomy- 1996 Laparotomy-exploratory s/p endometrial ablation 12/06 Tubal ligation  Social History: Last updated: 06/09/2009 Married Alcohol use-no 3 children Never Smoked Systems developer - [place] Marital Status: Married Children:  Occupation:    Risk Factors: Smoking Status: never (05/09/2008)  Review of Systems       all otherwise negative per pt -    Physical Exam  General:  alert and overweight-appearing.  nontoxic Head:  normocephalic and atraumatic.   Eyes:  vision grossly intact, pupils equal, and pupils round.   Ears:  bilat tm's clear, no effusion, sinus nontender Nose:  nasal dischargemucosal pallor and mucosal edema.   Mouth:  pharyngeal erythema and fair dentition.   Neck:  supple and no masses - except for minor LA to the right angle of the jaw Lungs:  normal respiratory effort, no intercostal retractions or use of accessory muscles; normal breath sounds bilaterally - no crackles and no wheezes.    Heart:  normal rate, regular rhythm, no murmur, and no rub. BLE without edema. Abdomen:  soft, non-tender, and normal bowel sounds.   Msk:  no joint tenderness  and no joint swelling., has mild bilat post paracervical tenderness without swelling or rash, and no specific spinal tenderness  Extremities:  no edema, no erythema  Neurologic:  strength normal in all extremities, sensation intact to light touch, and gait normal.   Skin:  left upper back at medial trapezoid/base of left neck with approx 1 cm subq lesion, firm but not hard and not lipomatous, mobile, nontender and not cystic Psych:  not depressed appearing and slightly anxious.     Impression & Recommendations:  Problem # 1:  SINUSITIS- ACUTE-NOS (ICD-461.9) Assessment Improved  Her updated medication list for this problem includes:    Nasonex 50 Mcg/act Susp (Mometasone furoate) .[name]... 2 sprays each nostril  every morning for sinus symptoms resolved, d/w pt - reassured, no further tx needed  Problem # 2:  SKIN LESION (ICD-709.9) left upper back near base of neck - I doubt is a lipoma due to the firmness of the lump which is approx 1 cm or slightly larger;  I suspect prob dermatofibroma or other benign type of skin related tumor, and susgested she see  surgury for removal but she declines at this time  Problem # 3:  NECK PAIN (ICD-723.1)  Her updated medication list for this problem includes:    Tramadol Hcl 50 Mg Tabs (Tramadol hcl) .[name]... 1 by mouth qid as needed    Flexeril 10 Mg Tabs (Cyclobenzaprine hcl) .[name]... 1/2-1 by mouth at bedtime x 7nights and every 12hours as needed for muscle spasm pain    Naproxen 500 Mg Tabs (Naproxen) .[name]... 1 by mouth two times a day as needed pain  by hx not likely related to the "skin lesion" above - I suspect underlying discomfort related to c-spine DJD/DDD - ok for naproxen as needed , exam o/w benign, cont flexeril as needed as well   Problem # 4:  ANXIETY (ICD-300.00) mild, stable overall by hx and exam, ok to continue meds/tx as is - no tx needed at this time  Complete Medication List: 1)  Symbicort 160-4.5 Mcg/act Aero (Budesonide-formoterol fumarate) .... 2 puffs two times a day 2)  Proair Hfa 108 (90 Base) Mcg/act Aers (Albuterol sulfate) .... 2 puff four times per day as needed 3)  Levothyroxine Sodium 125 Mcg Tabs (Levothyroxine sodium) .[name].. 1po once daily 4)  Benadryl 25 Mg Tabs (Diphenhydramine hcl) .... Take 1 by mouth qd 5)  Womens One Daily Tabs (Multiple vitamins-minerals) .... Take 1 by mouth qd 6)  Caltrate 600 1500 Mg Tabs (Calcium carbonate) .... Take 1 by mouth qd 7)  Fluconazole 150 Mg Tabs (Fluconazole) .[name].. 1 by mouth once daily as needed for yeast 8)  Tramadol Hcl 50 Mg Tabs (Tramadol hcl) .[name].. 1 by mouth qid as needed 9)  Allegra Allergy 180 Mg Tabs (Fexofenadine hcl) .... Take 1 by mouth once daily 10)  Flexeril 10 Mg Tabs (Cyclobenzaprine hcl) .... 1/2-1 by mouth at bedtime x 7nights and every 12hours as needed for muscle spasm pain 11)  Nasonex 50 Mcg/act Susp (Mometasone furoate) .... 2 sprays each nostril  every morning for sinus symptoms 12)  Naproxen 500 Mg Tabs (Naproxen) .[name].. 1 by mouth two times a day as needed pain  Patient Instructions: 1)  Please take all new  medications as prescribed 2)  Continue all previous medications as before this visit  3)  Please call if you would like the referral to General Surgury for the lump under the skin at the left upper back 4)  Continue all  previous medications as before this visit  - you are given the refills today 5)  Please schedule a follow-up appointment in October 2012 for CPX with labs , or sooner if needed Prescriptions: NAPROXEN 500 MG TABS (NAPROXEN) 1 by mouth two times a day as needed pain  #60 x 2   Entered and Authorized by:   Corwin Levins MD   Signed by:   Corwin Levins MD on 11/10/2010   Method used:   Print then Give to Patient   RxID:   [id number] PROAIR HFA 108 (90 BASE) MCG/ACT AERS (ALBUTEROL SULFATE) 2 puff four times per day as needed  #3 x 3   Entered and Authorized by:   Corwin Levins MD   Signed by:   Corwin Levins MD on 11/10/2010   Method used:   Print then Give to Patient   RxID:   [id number] SYMBICORT 160-4.5 MCG/ACT AERO (BUDESONIDE-FORMOTEROL FUMARATE) 2 puffs two times a day  #3 x 3   Entered and Authorized by:   Corwin Levins MD   Signed by:   Corwin Levins MD on 11/10/2010   Method used:   Print then Give to Patient   RxID:   [id number]    Orders Added: 1)  Est. Patient Level IV [01027]

## 2010-11-24 ENCOUNTER — Other Ambulatory Visit: Payer: Self-pay | Admitting: Obstetrics & Gynecology

## 2010-11-30 ENCOUNTER — Encounter (INDEPENDENT_AMBULATORY_CARE_PROVIDER_SITE_OTHER): Payer: Self-pay | Admitting: *Deleted

## 2010-12-10 ENCOUNTER — Encounter (INDEPENDENT_AMBULATORY_CARE_PROVIDER_SITE_OTHER): Payer: Self-pay | Admitting: *Deleted

## 2010-12-16 NOTE — Letter (Signed)
Summary: Pre Visit Letter Revised  [facility]  [address] [place], [place] [address]   Phone: [phone]  Fax: [phone]        12/10/2010 MRN: [id number] Suzanne Hansen [address] [place], [place]  [address]             Procedure Date:  January 13, 2011   dir col-Dr Dalene Carrow to the Gastroenterology Division at [facility].    You are scheduled to see a nurse for your pre-procedure visit on December 30, 2010 at 2:30pm on the 3rd floor at [facility], [address]  We ask that you try to arrive at our office 15 minutes prior to your appointment time to allow for check-in.  Please take a minute to review the attached form.  If you answer "Yes" to one or more of the questions on the first page, we ask that you call the person listed at your earliest opportunity.  If you answer "No" to all of the questions, please complete the rest of the form and bring it to your appointment.    Your nurse visit will consist of discussing your medical and surgical history, your immediate family medical history, and your medications.   If you are unable to list all of your medications on the form, please bring the medication bottles to your appointment and we will list them.  We will need to be aware of both prescribed and over the counter drugs.  We will need to know exact dosage information as well.    Please be prepared to read and sign documents such as consent forms, a financial agreement, and acknowledgement forms.  If necessary, and with your consent, a friend or relative is welcome to sit-in on the nurse visit with you.  Please bring your insurance card so that we may make a copy of it.  If your insurance requires a referral to see a specialist, please bring your referral form from your primary care physician.  No co-pay is required for this nurse visit.     If you cannot keep your appointment, please call [phone] to cancel or reschedule  prior to your appointment date.  This allows [place] the opportunity to schedule an appointment for another patient in need of care.    Thank you for choosing [facility] for your medical needs.  We appreciate the opportunity to care for you.  Please visit [place] at our website  to learn more about our practice.  Sincerely, The Gastroenterology Division

## 2010-12-21 NOTE — Letter (Signed)
Summary: Pelvic U/S  Pelvic U/S   Imported By: Lamona Curl CMA (AAMA) 12/13/2010 09:59:54  _____________________________________________________________________  External Attachment:    Type:   Image     Comment:   External Document

## 2010-12-21 NOTE — Letter (Signed)
Summary: [facility]  [facility]   Imported By: Lamona Curl CMA (AAMA) 12/13/2010 10:01:17  _____________________________________________________________________  External Attachment:    Type:   Image     Comment:   External Document

## 2010-12-26 LAB — POCT CARDIAC MARKERS

## 2010-12-30 ENCOUNTER — Ambulatory Visit (AMBULATORY_SURGERY_CENTER): Payer: BC Managed Care – PPO | Admitting: *Deleted

## 2010-12-30 VITALS — Ht 65.0 in | Wt 207.5 lb

## 2010-12-30 DIAGNOSIS — Z1211 Encounter for screening for malignant neoplasm of colon: Secondary | ICD-10-CM

## 2010-12-30 MED ORDER — PEG-KCL-NACL-NASULF-NA ASC-C 100 G PO SOLR: 1.0000 | Freq: Once | ORAL | Status: AC

## 2010-12-31 ENCOUNTER — Encounter: Payer: Self-pay | Admitting: Internal Medicine

## 2011-01-06 DIAGNOSIS — B0229 Other postherpetic nervous system involvement: Secondary | ICD-10-CM

## 2011-01-10 ENCOUNTER — Telehealth: Payer: Self-pay | Admitting: Internal Medicine

## 2011-01-10 ENCOUNTER — Ambulatory Visit: Payer: BC Managed Care – PPO | Admitting: Internal Medicine

## 2011-01-10 NOTE — Telephone Encounter (Signed)
Moviprep prescription called in to [facility] on [address].  Left message on Pt's home phone voice mail.  Ezra Sites

## 2011-01-12 ENCOUNTER — Encounter: Payer: Self-pay | Admitting: Internal Medicine

## 2011-01-13 ENCOUNTER — Encounter: Payer: Self-pay | Admitting: Internal Medicine

## 2011-01-13 ENCOUNTER — Ambulatory Visit (AMBULATORY_SURGERY_CENTER): Payer: BC Managed Care – PPO | Admitting: Internal Medicine

## 2011-01-13 DIAGNOSIS — Z1211 Encounter for screening for malignant neoplasm of colon: Secondary | ICD-10-CM

## 2011-01-13 DIAGNOSIS — R6889 Other general symptoms and signs: Secondary | ICD-10-CM

## 2011-01-13 DIAGNOSIS — D126 Benign neoplasm of colon, unspecified: Secondary | ICD-10-CM

## 2011-01-13 MED ORDER — SODIUM CHLORIDE 0.9 % IV SOLN: 500.0000 mL | INTRAVENOUS | Status: DC

## 2011-01-13 NOTE — Patient Instructions (Signed)
1 polyp Otherwise normal exam High fiber diet Recall colonoscopy in 5 -10 yrs, await pathology results See green and blue sheets for additional d/c instructions

## 2011-01-17 ENCOUNTER — Telehealth: Payer: Self-pay

## 2011-01-17 NOTE — Telephone Encounter (Signed)

## 2011-01-20 ENCOUNTER — Encounter: Payer: Self-pay | Admitting: Internal Medicine

## 2011-01-28 ENCOUNTER — Encounter: Payer: Self-pay | Admitting: Internal Medicine

## 2011-01-28 ENCOUNTER — Ambulatory Visit (INDEPENDENT_AMBULATORY_CARE_PROVIDER_SITE_OTHER): Payer: BC Managed Care – PPO | Admitting: Internal Medicine

## 2011-01-28 VITALS — BP 110/80 | HR 73 | Temp 98.7°F | Ht 65.0 in | Wt 211.2 lb

## 2011-01-28 DIAGNOSIS — J45909 Unspecified asthma, uncomplicated: Secondary | ICD-10-CM

## 2011-01-28 DIAGNOSIS — J40 Bronchitis, not specified as acute or chronic: Secondary | ICD-10-CM

## 2011-01-28 DIAGNOSIS — J309 Allergic rhinitis, unspecified: Secondary | ICD-10-CM

## 2011-01-28 DIAGNOSIS — Z0000 Encounter for general adult medical examination without abnormal findings: Secondary | ICD-10-CM

## 2011-01-28 DIAGNOSIS — E119 Type 2 diabetes mellitus without complications: Secondary | ICD-10-CM | POA: Insufficient documentation

## 2011-01-28 DIAGNOSIS — R7302 Impaired glucose tolerance (oral): Secondary | ICD-10-CM

## 2011-01-28 DIAGNOSIS — R7303 Prediabetes: Secondary | ICD-10-CM | POA: Insufficient documentation

## 2011-01-28 DIAGNOSIS — E1122 Type 2 diabetes mellitus with diabetic chronic kidney disease: Secondary | ICD-10-CM | POA: Insufficient documentation

## 2011-01-28 DIAGNOSIS — Z0001 Encounter for general adult medical examination with abnormal findings: Secondary | ICD-10-CM | POA: Insufficient documentation

## 2011-01-28 DIAGNOSIS — R7309 Other abnormal glucose: Secondary | ICD-10-CM

## 2011-01-28 HISTORY — DX: Impaired glucose tolerance (oral): R73.02

## 2011-01-28 MED ORDER — CHLORPHENIRAMINE-HYDROCODONE 8-10 MG/5ML PO LQCR: 5.0000 mL | Freq: Two times a day (BID) | ORAL | Status: DC | PRN

## 2011-01-28 MED ORDER — HYDROCODONE-HOMATROPINE 5-1.5 MG/5ML PO SYRP: 5.0000 mL | ORAL_SOLUTION | Freq: Four times a day (QID) | ORAL | Status: DC | PRN

## 2011-01-28 MED ORDER — LEVOFLOXACIN 250 MG PO TABS: 250.0000 mg | ORAL_TABLET | Freq: Every day | ORAL | Status: AC

## 2011-01-28 NOTE — Patient Instructions (Signed)
Take all new medications as prescribed The current medical regimen is effective;  continue present plan and medications. Please return in 6 mo with Lab testing done 3-5 days before

## 2011-01-28 NOTE — Assessment & Plan Note (Signed)
stable overall by hx and exam, most recent lab reviewed with pt, and pt to continue medical treatment as before 

## 2011-01-28 NOTE — Assessment & Plan Note (Signed)
Mild and stable overall by hx and exam, most recent lab reviewed with pt, and pt to continue medical treatment as before

## 2011-01-28 NOTE — Assessment & Plan Note (Signed)
stable overall by hx and exam, most recent lab reviewed with pt, and pt to continue medical treatment as before  Lab Results  Component Value Date   WBC 6.7 08/02/2010   HGB 13.3 08/02/2010   HCT 38.1 08/02/2010   PLT 246.0 08/02/2010   CHOL 167 08/02/2010   TRIG 49.0 08/02/2010   HDL 68.20 08/02/2010   ALT 16 08/02/2010   AST 19 08/02/2010   NA 139 08/02/2010   K 4.1 08/02/2010   CL 105 08/02/2010   CREATININE 0.9 08/02/2010   BUN 11 08/02/2010   CO2 27 08/02/2010   TSH 5.40 08/02/2010   HGBA1C 5.3 08/02/2010

## 2011-01-28 NOTE — Assessment & Plan Note (Signed)
Mild to mod, tx with antibx course,  to f/u any worsening symptoms or concerns  

## 2011-01-28 NOTE — Progress Notes (Signed)
Subjective:    Patient ID: Suzanne Hansen, female    DOB: 05/31/1956, 55 y.o.   MRN: [id number]  HPI  Here with acute onset mild to mod 2-3 days ST, HA, general weakness and malaise, with prod cough greenish sputum, but Pt denies chest pain, increased sob or doe, wheezing, orthopnea, PND, increased LE swelling, palpitations, dizziness or syncope.  Pt denies new neurological symptoms such as new headache, or facial or extremity weakness or numbness  Pt denies polydipsia, polyuria   Pt states overall good compliance with meds, trying to follow lower cholesterol  diet, wt overall stable but little exercise however.   Has had some nasal allergy symptoms in the past few wks, but without HA, fever, colored d/c or pain, cough or wheezing. Past Medical History  Diagnosis Date  . Hypoactive thyroid   . Asthma   . Allergy   . HERPES SIMPLEX, UNCOMPLICATED 05/23/2007  . HYPERTHYROIDISM 05/23/2007  . HYPOTHYROIDISM 05/09/2008  . VITAMIN D DEFICIENCY 08/28/2009  . GLUCOSE INTOLERANCE 08/28/2009  . OBESITY 05/23/2007  . ANXIETY 05/23/2007  . SINUSITIS- ACUTE-NOS 11/03/2008  . URI 08/28/2009  . ALLERGIC RHINITIS 05/09/2008  . ASTHMA 05/23/2007  . UTI 04/29/2008  . ENDOMETRIOSIS NOS 05/23/2007  . CONTACT DERMATITIS 06/09/2009  . UNSPECIFIED URTICARIA 06/04/2009  . SHOULDER PAIN, LEFT 05/17/2010  . LATERAL EPICONDYLITIS, RIGHT 03/10/2009  . ARM PAIN, LEFT 10/23/2009  . FACIAL PAIN 06/02/2010  . Wheezing 07/12/2010  . Unspecified Chest Pain 07/25/2008  . FREQUENCY, URINARY 05/17/2010  . INSOMNIA, HX OF 05/23/2007  . LIPOMA 10/22/2010  . SKIN LESION 11/10/2010  . NECK PAIN 11/10/2010  . Impaired glucose tolerance 01/28/2011   Past Surgical History  Procedure Date  . Endometrial ablation   . Wisdom tooth extraction   . Uterine fibroid surgery   . Laminectomy 1996  . Laparotomy     exploratory  . S/p endometrial ablation 12/06  . Tubal ligation     reports that she has never smoked. She does not have any  smokeless tobacco history on file. She reports that she does not use illicit drugs. Her alcohol history not on file. family history includes Cancer in her maternal grandmother; Diabetes in her cousin; Heart disease in her cousin; and Hypertension in her cousin. Allergies  Allergen Reactions  . Clarithromycin     REACTION: nausea  vomiting  . Metronidazole Nausea And Vomiting   Current Outpatient Prescriptions on File Prior to Visit  Medication Sig Dispense Refill  . albuterol (PROVENTIL) (2.5 MG/3ML) 0.083% nebulizer solution Take 2.5 mg by nebulization every 6 (six) hours as needed.        [name] aspirin 81 MG EC tablet Take 81 mg by mouth daily.        . budesonide-formoterol (SYMBICORT) 160-4.5 MCG/ACT inhaler Inhale 2 puffs into the lungs 1 dose over 46 hours.        [name] estradiol (VIVELLE-DOT) 0.1 MG/24HR Place 1 patch onto the skin 2 (two) times a week.        . fexofenadine (ALLEGRA) 180 MG tablet Take 180 mg by mouth daily.        . fluconazole (DIFLUCAN) 150 MG tablet Take 150 mg by mouth daily. As needed for yeast       . levothyroxine (SYNTHROID, LEVOTHROID) 125 MCG tablet Take 125 mcg by mouth daily.        . mometasone (NASONEX) 50 MCG/ACT nasal spray 2 sprays by Nasal route daily.        [name]  Multiple Vitamin (MULTIVITAMIN) capsule Take 1 capsule by mouth daily.        . progesterone (PROMETRIUM) 100 MG capsule Take 100 mg by mouth daily.        . traMADol (ULTRAM) 50 MG tablet Take 50 mg by mouth.        . Calcium Carbonate (CALTRATE 600) 1500 MG TABS Take by mouth daily.        . cyclobenzaprine (FLEXERIL) 10 MG tablet Take 10 mg by mouth. 1/2 - 1 by mouth at bedtime x 7 nights and every 12 hours as needed for muscle spasm pain       . diphenhydrAMINE (SOMINEX) 25 MG tablet Take 25 mg by mouth daily.         Current Facility-Administered Medications on File Prior to Visit  Medication Dose Route Frequency Provider Last Rate Last Dose  . 0.9 %  sodium chloride infusion  500 mL  Intravenous Continuous Eual Fines, RN       Review of Systems All otherwise neg per pt     Objective:   Physical Exam BP 110/80  Pulse 73  Temp(Src) 98.7 F (37.1 C) (Oral)  Ht 5\' 5"  (1.651 m)  Wt 211 lb 4 oz (95.822 kg)  BMI 35.15 kg/m2  SpO2 99% Physical Exam  VS noted Obese Constitutional: Pt appears well-developed and well-nourished.  HENT: Head: Normocephalic.  Left tm marked erythema, right TM clear, canals clear Right Ear: External ear normal.  Sinus nontender Left Ear: External ear normal.  Pharynx marked erythema, no exudate Eyes: Conjunctivae and EOM are normal. Pupils are equal, round, and reactive to light.  Neck: Normal range of motion. Neck supple.  Cardiovascular: Normal rate and regular rhythm.   Pulmonary/Chest: Effort normal and breath sounds normal.  Abd:  Soft, NT, non-distended, + BS Neurological: Pt is alert. No cranial nerve deficit.  Skin: Skin is warm. No erythema.  Psychiatric: Pt behavior is normal. Thought content normal.         Assessment & Plan:

## 2011-02-25 NOTE — Op Note (Signed)
NAMEMENNA, ABELN NO.:  [id number]   MEDICAL RECORD NO.:  [id number]          PATIENT TYPE:  AMB   LOCATION:  [facility]C                           FACILITY:  [facility]   PHYSICIAN:  Osborn Coho, M.D.   DATE OF BIRTH:  May 20, 1956   DATE OF PROCEDURE:  09/26/2005  DATE OF DISCHARGE:                                 OPERATIVE REPORT   PREOPERATIVE DIAGNOSES:  1.  Menorrhagia.  2.  Dysmenorrhea.   POSTOPERATIVE DIAGNOSES:  1.  Menorrhagia.  2.  Dysmenorrhea.   OPERATION/PROCEDURE:  1.  Hysteroscopy.  2.  Dilatation and curettage.  3.  Ablation via NovaSure.   SURGEON:  Osborn Coho, M.D.   FLUIDS:  800 mL.   URINARY OUTPUT:  Quiet sufficient via straight catheter prior to procedure.   ANESTHESIA:  General.   SPECIMENS:  Endometrial curettings.   ESTIMATED BLOOD LOSS:  Minimal.   COMPLICATIONS:  None.   FINDINGS:  No intracavitary lesions noted.   DESCRIPTION OF PROCEDURE:  The patient was taken to the operating room after  risks, benefits and alternatives were reviewed with the patient.  The  patient verbalized understanding and consent signed and witnessed.  The  patient was placed under general for anesthesia and prepped and draped in  the normal sterile fashion in the dorsal lithotomy position.  A bivalve  speculum was placed in the base of the vagina and a paracervical block was  administered using a total of 10 mL of 1% lidocaine.  Anterior lip of the  cervix grasped with a single-tooth tenaculum and uterus sounded to 9 cm.  Cervical length 4.5 cm and cavity length 4.5 cm.  The diagnostic  hysteroscope was introduced and findings as noted above.  Curettage was  performed and endometrial curetting sent to pathology.  The NovaSure  instrument was then placed into the cavity and cavity was noted to be 4.4  cm.  Ablation was performed for a time of 1 minute and 40 seconds.  All  instruments were removed.  Silver nitrate was applied to the right  tenaculum  site for hemostasis.  Sponge count was correct.  The patient tolerated the  procedure well and is currently being  transferred to the recovery room in  good condition.      Osborn Coho, M.D.  Electronically Signed     AR/MEDQ  D:  09/26/2005  T:  09/27/2005  Job:  [id number]

## 2011-05-23 ENCOUNTER — Ambulatory Visit (INDEPENDENT_AMBULATORY_CARE_PROVIDER_SITE_OTHER): Payer: BC Managed Care – PPO | Admitting: Internal Medicine

## 2011-05-23 ENCOUNTER — Ambulatory Visit (INDEPENDENT_AMBULATORY_CARE_PROVIDER_SITE_OTHER)
Admission: RE | Admit: 2011-05-23 | Discharge: 2011-05-23 | Disposition: A | Discharge status: Home / Self Care | Payer: BC Managed Care – PPO | Source: Ambulatory Visit | Attending: Internal Medicine | Admitting: Internal Medicine

## 2011-05-23 ENCOUNTER — Encounter: Payer: Self-pay | Admitting: Internal Medicine

## 2011-05-23 VITALS — BP 100/70 | HR 77 | Temp 98.1°F | Ht 68.0 in | Wt 209.5 lb

## 2011-05-23 DIAGNOSIS — R7309 Other abnormal glucose: Secondary | ICD-10-CM

## 2011-05-23 DIAGNOSIS — F411 Generalized anxiety disorder: Secondary | ICD-10-CM

## 2011-05-23 DIAGNOSIS — R220 Localized swelling, mass and lump, head: Secondary | ICD-10-CM | POA: Insufficient documentation

## 2011-05-23 DIAGNOSIS — Z0000 Encounter for general adult medical examination without abnormal findings: Secondary | ICD-10-CM

## 2011-05-23 DIAGNOSIS — R221 Localized swelling, mass and lump, neck: Secondary | ICD-10-CM

## 2011-05-23 DIAGNOSIS — R7302 Impaired glucose tolerance (oral): Secondary | ICD-10-CM

## 2011-05-23 NOTE — Assessment & Plan Note (Signed)
Rather large left base of neck and the left supraclacular area;  Suspect lipoma but cant r/o other - for CXR today, and refer ent - Dr Haroldine Laws per pt preference

## 2011-05-23 NOTE — Assessment & Plan Note (Signed)
Has gained about 6 lbs since feb 2012, asympt, delcines labs today - will check next visit; urged to gain more exercise, wt loss

## 2011-05-23 NOTE — Assessment & Plan Note (Signed)
stable overall by hx and exam, most recent data reviewed with pt, and pt to continue medical treatment as before  Lab Results  Component Value Date   WBC 6.7 08/02/2010   HGB 13.3 08/02/2010   HCT 38.1 08/02/2010   PLT 246.0 08/02/2010   CHOL 167 08/02/2010   TRIG 49.0 08/02/2010   HDL 68.20 08/02/2010   ALT 16 08/02/2010   AST 19 08/02/2010   NA 139 08/02/2010   K 4.1 08/02/2010   CL 105 08/02/2010   CREATININE 0.9 08/02/2010   BUN 11 08/02/2010   CO2 27 08/02/2010   TSH 5.40 08/02/2010   HGBA1C 5.3 08/02/2010

## 2011-05-23 NOTE — Progress Notes (Signed)
Subjective:    Patient ID: Suzanne Hansen, female    DOB: Oct 26, 1955, 55 y.o.   MRN: [id number]  HPI  C/o left base of the neck anteriorly with fatty consistency enlarging for the past 2 months, seemed worse to her when looked at again last 3 days; no pain, fever, ST , cough and Pt denies chest pain, increased sob or doe, wheezing, orthopnea, PND, increased LE swelling, palpitations, dizziness or syncope.  Pt denies new neurological symptoms such as new headache, or facial or extremity weakness or numbness.   Pt denies polydipsia, polyuria.  Pt denies fever, wt loss, night sweats, loss of appetite, or other constitutional symptoms  Denies hyper or hypo thyroid symptoms such as voice, skin or hair change. Denies worsening depressive symptoms, suicidal ideation, or panic, though has ongoing anxiety, not increased recently.   Past Medical History  Diagnosis Date  . Hypoactive thyroid   . Asthma   . Allergy   . HERPES SIMPLEX, UNCOMPLICATED 05/23/2007  . HYPERTHYROIDISM 05/23/2007  . HYPOTHYROIDISM 05/09/2008  . VITAMIN D DEFICIENCY 08/28/2009  . GLUCOSE INTOLERANCE 08/28/2009  . OBESITY 05/23/2007  . ANXIETY 05/23/2007  . SINUSITIS- ACUTE-NOS 11/03/2008  . URI 08/28/2009  . ALLERGIC RHINITIS 05/09/2008  . ASTHMA 05/23/2007  . UTI 04/29/2008  . ENDOMETRIOSIS NOS 05/23/2007  . CONTACT DERMATITIS 06/09/2009  . UNSPECIFIED URTICARIA 06/04/2009  . SHOULDER PAIN, LEFT 05/17/2010  . LATERAL EPICONDYLITIS, RIGHT 03/10/2009  . ARM PAIN, LEFT 10/23/2009  . FACIAL PAIN 06/02/2010  . Wheezing 07/12/2010  . Unspecified Chest Pain 07/25/2008  . FREQUENCY, URINARY 05/17/2010  . INSOMNIA, HX OF 05/23/2007  . LIPOMA 10/22/2010  . SKIN LESION 11/10/2010  . NECK PAIN 11/10/2010  . Impaired glucose tolerance 01/28/2011   Past Surgical History  Procedure Date  . Endometrial ablation   . Wisdom tooth extraction   . Uterine fibroid surgery   . Laminectomy 1996  . Laparotomy     exploratory  . S/p endometrial  ablation 12/06  . Tubal ligation     reports that she has never smoked. She does not have any smokeless tobacco history on file. She reports that she does not use illicit drugs. Her alcohol history not on file. family history includes Cancer in her maternal grandmother; Diabetes in her cousin; Heart disease in her cousin; and Hypertension in her cousin. Allergies  Allergen Reactions  . Clarithromycin     REACTION: nausea  vomiting  . Metronidazole Nausea And Vomiting   Current Outpatient Prescriptions on File Prior to Visit  Medication Sig Dispense Refill  . albuterol (PROVENTIL) (2.5 MG/3ML) 0.083% nebulizer solution Take 2.5 mg by nebulization every 6 (six) hours as needed.        . budesonide-formoterol (SYMBICORT) 160-4.5 MCG/ACT inhaler Inhale 2 puffs into the lungs 1 dose over 46 hours.        . Calcium Carbonate (CALTRATE 600) 1500 MG TABS Take by mouth daily.        . cyclobenzaprine (FLEXERIL) 10 MG tablet Take 10 mg by mouth. 1/2 - 1 by mouth at bedtime x 7 nights and every 12 hours as needed for muscle spasm pain       . estradiol (VIVELLE-DOT) 0.1 MG/24HR Place 1 patch onto the skin 2 (two) times a week.        . fexofenadine (ALLEGRA) 180 MG tablet Take 180 mg by mouth daily.        [name] levothyroxine (SYNTHROID, LEVOTHROID) 125 MCG tablet Take 125 mcg by mouth daily.        [name]  mometasone (NASONEX) 50 MCG/ACT nasal spray 2 sprays by Nasal route daily.        . Multiple Vitamin (MULTIVITAMIN) capsule Take 1 capsule by mouth daily.        . traMADol (ULTRAM) 50 MG tablet Take 50 mg by mouth.        [name] aspirin 81 MG EC tablet Take 81 mg by mouth daily.         Current Facility-Administered Medications on File Prior to Visit  Medication Dose Route Frequency Provider Last Rate Last Dose  . 0.9 %  sodium chloride infusion  500 mL Intravenous Continuous Eual Fines, RN       Review of Systems Review of Systems  Constitutional: Negative for diaphoresis and unexpected weight  change.  HENT: Negative for drooling and tinnitus.   Eyes: Negative for photophobia and visual disturbance.  Respiratory: Negative for choking and stridor.   Gastrointestinal: Negative for vomiting and blood in stool.  Genitourinary: Negative for hematuria and decreased urine volume.        Objective:   Physical Exam BP 100/70  Pulse 77  Temp(Src) 98.1 F (36.7 C) (Oral)  Ht 5\' 8"  (1.727 m)  Wt 209 lb 8 oz (95.029 kg)  BMI 31.85 kg/m2  SpO2 98% Physical Exam  VS noted Constitutional: Pt appears well-developed and well-nourished.  HENT: Head: Normocephalic.  Right Ear: External ear normal.  Left Ear: External ear normal.  Eyes: Conjunctivae and EOM are normal. Pupils are equal, round, and reactive to light.  Neck: Normal range of motion. Neck supple.  Cardiovascular: Normal rate and regular rhythm.   Pulmonary/Chest: Effort normal and breath sounds normal.  Abd:  Soft, NT, non-distended, + BS Neurological: Pt is alert. No cranial nerve deficit.  Skin: Skin is warm. No erythema. Does have rather large nondiscrete fatty type mass at least 4-5 cm at left base of neck/left supraclavicular area; no other mass or palpable LA noted Psychiatric: Pt behavior is normal. Thought content normal. 1+ nervous        Assessment & Plan:

## 2011-05-23 NOTE — Patient Instructions (Signed)
.  Continue all other medications as before Please go to XRAY in the Basement for the x-ray test Please call the phone number [phone] (the [employer] System) for results of testing in 2-3 days;  When calling, simply dial the number, and when prompted enter the MRN number above (the Medical Record Number) and the # key, then the message should start. You will be contacted regarding the referral for: ENT Please return in 3 months with Lab testing done 3-5 days before

## 2011-05-26 ENCOUNTER — Other Ambulatory Visit: Payer: Self-pay | Admitting: Otolaryngology

## 2011-05-26 DIAGNOSIS — R221 Localized swelling, mass and lump, neck: Secondary | ICD-10-CM

## 2011-05-27 ENCOUNTER — Ambulatory Visit
Admission: RE | Admit: 2011-05-27 | Discharge: 2011-05-27 | Disposition: A | Discharge status: Home / Self Care | Payer: BC Managed Care – PPO | Source: Ambulatory Visit | Attending: Otolaryngology | Admitting: Otolaryngology

## 2011-05-27 DIAGNOSIS — R221 Localized swelling, mass and lump, neck: Secondary | ICD-10-CM

## 2011-05-27 MED ORDER — IOHEXOL 300 MG/ML  SOLN
50.0000 mL | Freq: Once | INTRAMUSCULAR | Status: AC | PRN
  Administered 2011-05-27: 50 mL via INTRAVENOUS

## 2011-07-20 ENCOUNTER — Other Ambulatory Visit: Payer: Self-pay | Admitting: Internal Medicine

## 2011-07-28 ENCOUNTER — Other Ambulatory Visit (INDEPENDENT_AMBULATORY_CARE_PROVIDER_SITE_OTHER): Payer: BC Managed Care – PPO

## 2011-07-28 DIAGNOSIS — Z0000 Encounter for general adult medical examination without abnormal findings: Secondary | ICD-10-CM

## 2011-07-28 DIAGNOSIS — R7302 Impaired glucose tolerance (oral): Secondary | ICD-10-CM

## 2011-07-28 DIAGNOSIS — R7309 Other abnormal glucose: Secondary | ICD-10-CM

## 2011-07-28 LAB — URINALYSIS, ROUTINE W REFLEX MICROSCOPIC
Bilirubin Urine: NEGATIVE
Ketones, ur: NEGATIVE
Nitrite: NEGATIVE
Specific Gravity, Urine: <=1.005 (ref 1.000–1.030)
Total Protein, Urine: NEGATIVE
Urine Glucose: NEGATIVE
Urobilinogen, UA: 0.2 (ref 0.0–1.0)
pH: 6 (ref 5.0–8.0)

## 2011-07-28 LAB — HEPATIC FUNCTION PANEL
ALT: 18 U/L (ref 0–35)
Alkaline Phosphatase: 72 U/L (ref 39–117)
Bilirubin, Direct: 0.1 mg/dL (ref 0.0–0.3)
Total Protein: 7.2 g/dL (ref 6.0–8.3)

## 2011-07-28 LAB — TSH: TSH: 2.07 u[IU]/mL (ref 0.35–5.50)

## 2011-07-28 LAB — CBC WITH DIFFERENTIAL/PLATELET
Basophils Absolute: 0 10*3/uL (ref 0.0–0.1)
Hemoglobin: 13.5 g/dL (ref 12.0–15.0)
Lymphocytes Relative: 32 % (ref 12.0–46.0)
Monocytes Relative: 7.7 % (ref 3.0–12.0)
Platelets: 260 10*3/uL (ref 150.0–400.0)
RDW: 13 % (ref 11.5–14.6)

## 2011-07-28 LAB — LIPID PANEL: Total CHOL/HDL Ratio: 2

## 2011-07-28 LAB — BASIC METABOLIC PANEL
CO2: 29 mEq/L (ref 19–32)
Chloride: 105 mEq/L (ref 96–112)
Sodium: 141 mEq/L (ref 135–145)

## 2011-08-01 ENCOUNTER — Encounter: Payer: Self-pay | Admitting: Internal Medicine

## 2011-08-01 ENCOUNTER — Ambulatory Visit (INDEPENDENT_AMBULATORY_CARE_PROVIDER_SITE_OTHER): Payer: BC Managed Care – PPO | Admitting: Internal Medicine

## 2011-08-01 VITALS — BP 108/78 | HR 87 | Temp 98.7°F | Ht 66.0 in | Wt 210.4 lb

## 2011-08-01 DIAGNOSIS — D179 Benign lipomatous neoplasm, unspecified: Secondary | ICD-10-CM

## 2011-08-01 DIAGNOSIS — Z23 Encounter for immunization: Secondary | ICD-10-CM

## 2011-08-01 DIAGNOSIS — J069 Acute upper respiratory infection, unspecified: Secondary | ICD-10-CM | POA: Insufficient documentation

## 2011-08-01 DIAGNOSIS — Z0000 Encounter for general adult medical examination without abnormal findings: Secondary | ICD-10-CM

## 2011-08-01 DIAGNOSIS — M722 Plantar fascial fibromatosis: Secondary | ICD-10-CM

## 2011-08-01 MED ORDER — AZITHROMYCIN 250 MG PO TABS: ORAL_TABLET | ORAL | Status: AC

## 2011-08-01 NOTE — Assessment & Plan Note (Signed)
Mild to mod, for antibx course,  to f/u any worsening symptoms or concerns 

## 2011-08-01 NOTE — Progress Notes (Signed)
Addended by: Scharlene Gloss B on: 08/01/2011 10:12 AM   Modules accepted: Orders

## 2011-08-01 NOTE — Assessment & Plan Note (Signed)
S/p surgury eval - rec'd pt f/u with plastic surgury due to location of left supraclavicalar mass; pt still considering for now

## 2011-08-01 NOTE — Progress Notes (Signed)
Subjective:    Patient ID: Suzanne Hansen, female    DOB: 1956-02-06, 55 y.o.   MRN: [id number]  HPI  Here for wellness and f/u;  Overall doing ok;  Pt denies CP, worsening SOB, DOE, wheezing, orthopnea, PND, worsening LE edema, palpitations, dizziness or syncope.  Pt denies neurological change such as new Headache, facial or extremity weakness.  Pt denies polydipsia, polyuria, or low sugar symptoms. Pt states overall good compliance with treatment and medications, good tolerability, and trying to follow lower cholesterol diet.  Pt denies worsening depressive symptoms, suicidal ideation or panic. No fever, wt loss, night sweats, loss of appetite, or other constitutional symptoms.  Pt states good ability with ADL's, low fall risk, home safety reviewed and adequate, no significant changes in hearing or vision, and occasionally active with exercise.  Does have acute onset ST with fever for 3 days as well, with some right ear pain and pressure, wihtout chills, vertigo, n/v, or sinus pain. Also with some bilat feet pain, with known arch problem bilat, worse with more walking, worse with pain to both plantar heels. Did see surgury for the neck mass, who rec'd plastic surgury eval instead due to the location. Past Medical History  Diagnosis Date  . Hypoactive thyroid   . Asthma   . Allergy   . HERPES SIMPLEX, UNCOMPLICATED 05/23/2007  . HYPERTHYROIDISM 05/23/2007  . HYPOTHYROIDISM 05/09/2008  . VITAMIN D DEFICIENCY 08/28/2009  . GLUCOSE INTOLERANCE 08/28/2009  . OBESITY 05/23/2007  . ANXIETY 05/23/2007  . SINUSITIS- ACUTE-NOS 11/03/2008  . URI 08/28/2009  . ALLERGIC RHINITIS 05/09/2008  . ASTHMA 05/23/2007  . UTI 04/29/2008  . ENDOMETRIOSIS NOS 05/23/2007  . CONTACT DERMATITIS 06/09/2009  . UNSPECIFIED URTICARIA 06/04/2009  . SHOULDER PAIN, LEFT 05/17/2010  . LATERAL EPICONDYLITIS, RIGHT 03/10/2009  . ARM PAIN, LEFT 10/23/2009  . FACIAL PAIN 06/02/2010  . Wheezing 07/12/2010  . Unspecified Chest Pain  07/25/2008  . FREQUENCY, URINARY 05/17/2010  . INSOMNIA, HX OF 05/23/2007  . LIPOMA 10/22/2010  . SKIN LESION 11/10/2010  . NECK PAIN 11/10/2010  . Impaired glucose tolerance 01/28/2011   Past Surgical History  Procedure Date  . Endometrial ablation   . Wisdom tooth extraction   . Uterine fibroid surgery   . Laminectomy 1996  . Laparotomy     exploratory  . S/p endometrial ablation 12/06  . Tubal ligation     reports that she has never smoked. She does not have any smokeless tobacco history on file. She reports that she does not use illicit drugs. Her alcohol history not on file. family history includes Cancer in her maternal grandmother; Diabetes in her cousin; Heart disease in her cousin; and Hypertension in her cousin. Allergies  Allergen Reactions  . Clarithromycin     REACTION: nausea  vomiting  . Metronidazole Nausea And Vomiting   Current Outpatient Prescriptions on File Prior to Visit  Medication Sig Dispense Refill  . Calcium Carbonate (CALTRATE 600) 1500 MG TABS Take by mouth daily.        [name] estradiol (VIVELLE-DOT) 0.1 MG/24HR Place 1 patch onto the skin 2 (two) times a week.        . fexofenadine (ALLEGRA) 180 MG tablet Take 180 mg by mouth daily.        [name] levothyroxine (SYNTHROID, LEVOTHROID) 125 MCG tablet Take 125 mcg by mouth daily.        . Multiple Vitamin (MULTIVITAMIN) capsule Take 1 capsule by mouth daily.        [name]  PROAIR HFA 108 (90 BASE) MCG/ACT inhaler INHALE TWO PUFFS 4 TIMES DAILY AS NEEDED  9 g  10  . SYMBICORT 160-4.5 MCG/ACT inhaler INHALE TWO PUFFS BY MOUTH TWICE DAILY  11 g  10  . albuterol (PROVENTIL) (2.5 MG/3ML) 0.083% nebulizer solution Take 2.5 mg by nebulization every 6 (six) hours as needed.        [name] aspirin 81 MG EC tablet Take 81 mg by mouth daily.        . cyclobenzaprine (FLEXERIL) 10 MG tablet Take 10 mg by mouth. 1/2 - 1 by mouth at bedtime x 7 nights and every 12 hours as needed for muscle spasm pain       . mometasone (NASONEX) 50 MCG/ACT  nasal spray 2 sprays by Nasal route daily.        . traMADol (ULTRAM) 50 MG tablet Take 50 mg by mouth.         Review of Systems Review of Systems  Constitutional: Negative for diaphoresis, activity change, appetite change and unexpected weight change.  HENT: Negative for hearing loss, ear pain, facial swelling, mouth sores and neck stiffness.   Eyes: Negative for pain, redness and visual disturbance.  Respiratory: Negative for shortness of breath and wheezing.   Cardiovascular: Negative for chest pain and palpitations.  Gastrointestinal: Negative for diarrhea, blood in stool, abdominal distention and rectal pain.  Genitourinary: Negative for hematuria, flank pain and decreased urine volume.  Musculoskeletal: Negative for myalgias and joint swelling.  Skin: Negative for color change and wound.  Neurological: Negative for syncope and numbness.  Hematological: Negative for adenopathy.  Psychiatric/Behavioral: Negative for hallucinations, self-injury, decreased concentration and agitation.     Objective:   Physical Exam BP 108/78  Pulse 87  Temp(Src) 98.7 F (37.1 C) (Oral)  Ht 5\' 6"  (1.676 m)  Wt 210 lb 6 oz (95.425 kg)  BMI 33.96 kg/m2  SpO2 96% Physical Exam  VS noted Constitutional: Pt is oriented to person, place, and time. Appears well-developed and well-nourished.  Head: Normocephalic and atraumatic.  Right Ear: External ear normal.  Left Ear: External ear normal.  Nose: Nose normal.  Mouth/Throat: Oropharynx is clear and moist.  Bilat tm's mild erythema.  Sinus nontender.  Pharynx mild erythema Eyes: Conjunctivae and EOM are normal. Pupils are equal, round, and reactive to light.  Neck: Normal range of motion. Neck supple. No JVD present. No tracheal deviation present.  Cardiovascular: Normal rate, regular rhythm, normal heart sounds and intact distal pulses.   Pulmonary/Chest: Effort normal and breath sounds normal.  Abdominal: Soft. Bowel sounds are normal. There is no  tenderness.  Musculoskeletal: Normal range of motion. Exhibits no edema.  Lymphadenopathy:  Has no cervical adenopathy.  Neurological: Pt is alert and oriented to person, place, and time. Pt has normal reflexes. No cranial nerve deficit.  Skin: Skin is warm and dry. No rash noted.  Psychiatric:  Has  normal mood and affect. Behavior is normal.  Bilat heels mild tender without ulcer, red, swelling    Assessment & Plan:

## 2011-08-01 NOTE — Assessment & Plan Note (Signed)
Mild to mod, for podiatry referral,  to f/u any worsening symptoms or concerns 

## 2011-08-01 NOTE — Patient Instructions (Addendum)
You had the flu shot today Take all new medications as prescribed - the antibiotic Continue all other medications as before You will be contacted regarding the referral for: podiatry Please call if you wish to have the referral to Plastic Surgury for the left neck Please return in 1 year for your yearly visit, or sooner if needed, with Lab testing done 3-5 days before

## 2011-08-01 NOTE — Assessment & Plan Note (Signed)

## 2011-09-21 ENCOUNTER — Other Ambulatory Visit: Payer: Self-pay | Admitting: Internal Medicine

## 2011-09-22 ENCOUNTER — Telehealth: Payer: Self-pay

## 2011-09-22 NOTE — Telephone Encounter (Signed)
Pharmacy to inform the patient would like to increase the dosage of her proair due to having to use more often and insurance will not fill as too early, please advise

## 2011-09-22 NOTE — Telephone Encounter (Signed)
Pt is already on the most proair availabe, and would not advise use more than every 6 hours (or 4 times per day(  If using more than this, she should be using the symbicort as her main treatment  If using both the proair and symbicort as prescribed, she will need OV

## 2011-09-23 NOTE — Telephone Encounter (Signed)
Patient informed. 

## 2011-10-03 ENCOUNTER — Encounter: Payer: Self-pay | Admitting: Internal Medicine

## 2011-11-28 ENCOUNTER — Ambulatory Visit (INDEPENDENT_AMBULATORY_CARE_PROVIDER_SITE_OTHER): Payer: BC Managed Care – PPO | Admitting: Internal Medicine

## 2011-11-28 ENCOUNTER — Encounter: Payer: Self-pay | Admitting: Internal Medicine

## 2011-11-28 VITALS — BP 124/80 | HR 81 | Temp 98.4°F | Ht 66.0 in | Wt 210.1 lb

## 2011-11-28 DIAGNOSIS — J45909 Unspecified asthma, uncomplicated: Secondary | ICD-10-CM

## 2011-11-28 DIAGNOSIS — F411 Generalized anxiety disorder: Secondary | ICD-10-CM

## 2011-11-28 DIAGNOSIS — J019 Acute sinusitis, unspecified: Secondary | ICD-10-CM | POA: Insufficient documentation

## 2011-11-28 MED ORDER — LEVOFLOXACIN 250 MG PO TABS: 250.0000 mg | ORAL_TABLET | Freq: Every day | ORAL | Status: AC

## 2011-11-28 MED ORDER — HYDROCODONE-HOMATROPINE 5-1.5 MG/5ML PO SYRP: 5.0000 mL | ORAL_SOLUTION | Freq: Four times a day (QID) | ORAL | Status: AC | PRN

## 2011-11-28 NOTE — Assessment & Plan Note (Signed)
Mild to mod, for antibx course,  to f/u any worsening symptoms or concerns 

## 2011-11-28 NOTE — Patient Instructions (Addendum)
Take all new medications as prescribed Continue all other medications as before Please call if the wheezing begins, as we can probably add a small amount of prednisone

## 2011-11-28 NOTE — Assessment & Plan Note (Signed)
stable overall by hx and exam, most recent data reviewed with pt, and pt to continue medical treatment as before  SpO2 Readings from Last 3 Encounters:  11/28/11 98%  08/01/11 96%  05/23/11 98%

## 2011-11-28 NOTE — Assessment & Plan Note (Signed)
stable overall by hx and exam, most recent data reviewed with pt, and pt to continue medical treatment as before  Lab Results  Component Value Date   WBC 6.7 07/28/2011   HGB 13.5 07/28/2011   HCT 40.5 07/28/2011   PLT 260.0 07/28/2011   GLUCOSE 88 07/28/2011   CHOL 168 07/28/2011   TRIG 49.0 07/28/2011   HDL 74.20 07/28/2011   LDLCALC 84 07/28/2011   ALT 18 07/28/2011   AST 20 07/28/2011   NA 141 07/28/2011   K 4.3 07/28/2011   CL 105 07/28/2011   CREATININE 1.0 07/28/2011   BUN 13 07/28/2011   CO2 29 07/28/2011   TSH 2.07 07/28/2011   HGBA1C 5.3 07/28/2011

## 2011-11-28 NOTE — Progress Notes (Signed)
Subjective:    Patient ID: Suzanne Hansen, female    DOB: 24-Dec-1955, 56 y.o.   MRN: [id number]  HPI   Here with 3 days acute onset fever, facial pain, pressure, general weakness and malaise, and greenish d/c, with slight ST, but little to no cough and Pt denies chest pain, increased sob or doe, wheezing, orthopnea, PND, increased LE swelling, palpitations, dizziness or syncope.  Pt denies new neurological symptoms such as new headache, or facial or extremity weakness or numbness   Pt denies polydipsia, polyuria,.  Pt states overall good compliance with meds, trying to follow lower cholesterol, diabetic diet, wt overall stable but little exercise however.   Denies worsening depressive symptoms, suicidal ideation, or panic, though has ongoing anxiety, not increased recently.  Past Medical History  Diagnosis Date  . Hypoactive thyroid   . Asthma   . Allergy   . HERPES SIMPLEX, UNCOMPLICATED 05/23/2007  . HYPERTHYROIDISM 05/23/2007  . HYPOTHYROIDISM 05/09/2008  . VITAMIN D DEFICIENCY 08/28/2009  . GLUCOSE INTOLERANCE 08/28/2009  . OBESITY 05/23/2007  . ANXIETY 05/23/2007  . SINUSITIS- ACUTE-NOS 11/03/2008  . URI 08/28/2009  . ALLERGIC RHINITIS 05/09/2008  . ASTHMA 05/23/2007  . UTI 04/29/2008  . ENDOMETRIOSIS NOS 05/23/2007  . CONTACT DERMATITIS 06/09/2009  . UNSPECIFIED URTICARIA 06/04/2009  . SHOULDER PAIN, LEFT 05/17/2010  . LATERAL EPICONDYLITIS, RIGHT 03/10/2009  . ARM PAIN, LEFT 10/23/2009  . FACIAL PAIN 06/02/2010  . Wheezing 07/12/2010  . Unspecified Chest Pain 07/25/2008  . FREQUENCY, URINARY 05/17/2010  . INSOMNIA, HX OF 05/23/2007  . LIPOMA 10/22/2010  . SKIN LESION 11/10/2010  . NECK PAIN 11/10/2010  . Impaired glucose tolerance 01/28/2011   Past Surgical History  Procedure Date  . Endometrial ablation   . Wisdom tooth extraction   . Uterine fibroid surgery   . Laminectomy 1996  . Laparotomy     exploratory  . S/p endometrial ablation 12/06  . Tubal ligation     reports that she  has never smoked. She does not have any smokeless tobacco history on file. She reports that she does not use illicit drugs. Her alcohol history not on file. family history includes Cancer in her maternal grandmother; Diabetes in her cousin; Heart disease in her cousin; and Hypertension in her cousin. Allergies  Allergen Reactions  . Clarithromycin     REACTION: nausea  vomiting  . Metronidazole Nausea And Vomiting   Current Outpatient Prescriptions on File Prior to Visit  Medication Sig Dispense Refill  . aspirin 81 MG EC tablet Take 81 mg by mouth daily.        . Calcium Carbonate (CALTRATE 600) 1500 MG TABS Take by mouth daily.        [name] estradiol (VIVELLE-DOT) 0.1 MG/24HR Place 1 patch onto the skin 2 (two) times a week.        . fexofenadine (ALLEGRA) 180 MG tablet Take 180 mg by mouth daily.        . mometasone (NASONEX) 50 MCG/ACT nasal spray 2 sprays by Nasal route daily.        . Multiple Vitamin (MULTIVITAMIN) capsule Take 1 capsule by mouth daily.        [name] PROAIR HFA 108 (90 BASE) MCG/ACT inhaler INHALE TWO PUFFS 4 TIMES DAILY AS NEEDED  9 g  10  . SYMBICORT 160-4.5 MCG/ACT inhaler INHALE TWO PUFFS BY MOUTH TWICE DAILY  11 g  10  . traMADol (ULTRAM) 50 MG tablet Take 50 mg by mouth.  Review of Systems Review of Systems  Constitutional: Negative for diaphoresis and unexpected weight change.  HENT: Negative for drooling and tinnitus.   Eyes: Negative for photophobia and visual disturbance.  Respiratory: Negative for choking and stridor.   Gastrointestinal: Negative for vomiting and blood in stool.  Genitourinary: Negative for hematuria and decreased urine volume.     Objective:   Physical Exam BP 124/80  Pulse 81  Temp(Src) 98.4 F (36.9 C) (Oral)  Ht 5\' 6"  (1.676 m)  Wt 210 lb 1.9 oz (95.31 kg)  BMI 33.91 kg/m2  SpO2 98% Physical Exam  VS noted, mild ill Constitutional: Pt appears well-developed and well-nourished.  HENT: Head: Normocephalic.  Right Ear:  External ear normal.  Left Ear: External ear normal.  Bilat tm's mild erythema.  Sinus tender bilat.  Pharynx mild erythema Eyes: Conjunctivae and EOM are normal. Pupils are equal, round, and reactive to light.  Neck: Normal range of motion. Neck supple.  Cardiovascular: Normal rate and regular rhythm.   Pulmonary/Chest: Effort normal and breath sounds normal.  Abd:  Soft, NT, non-distended, + BS Neurological: Pt is alert. No cranial nerve deficit.  Skin: Skin is warm. No erythema.  Psychiatric: Pt behavior is normal. Thought content normal. 1+ nervous, not depressed affect    Assessment & Plan:

## 2011-11-29 ENCOUNTER — Ambulatory Visit: Payer: BC Managed Care – PPO | Admitting: Internal Medicine

## 2011-12-01 ENCOUNTER — Ambulatory Visit: Payer: BC Managed Care – PPO | Admitting: Internal Medicine

## 2011-12-02 ENCOUNTER — Telehealth: Payer: Self-pay | Admitting: *Deleted

## 2011-12-02 MED ORDER — HYDROCOD POLST-CHLORPHEN POLST 10-8 MG/5ML PO LQCR: 5.0000 mL | Freq: Two times a day (BID) | ORAL | Status: DC | PRN

## 2011-12-02 NOTE — Telephone Encounter (Signed)
Ok for mucinex bid otc prn 

## 2011-12-02 NOTE — Telephone Encounter (Signed)
Patient informed. 

## 2011-12-02 NOTE — Telephone Encounter (Signed)
Faxed hardcopy to pharmacy. Also patient stated chest is hurting (thinks may be due to all the coughing), please advise if anything else to do. States the congestion does seem to be breaking up.

## 2011-12-02 NOTE — Telephone Encounter (Signed)
Done hardcopy to robin  

## 2011-12-02 NOTE — Telephone Encounter (Signed)
Pt is requesting a refill for different cough syrup. She wants refill for generic Tussonex because she states that cough med given to her at 2/18 OV makes her nervous.

## 2011-12-23 ENCOUNTER — Encounter: Payer: Self-pay | Admitting: Internal Medicine

## 2011-12-23 ENCOUNTER — Ambulatory Visit: Payer: BC Managed Care – PPO | Admitting: Internal Medicine

## 2011-12-23 ENCOUNTER — Ambulatory Visit (INDEPENDENT_AMBULATORY_CARE_PROVIDER_SITE_OTHER): Payer: BC Managed Care – PPO | Admitting: Internal Medicine

## 2011-12-23 VITALS — BP 102/68 | HR 68 | Temp 98.7°F | Ht 64.5 in | Wt 210.4 lb

## 2011-12-23 DIAGNOSIS — J309 Allergic rhinitis, unspecified: Secondary | ICD-10-CM

## 2011-12-23 DIAGNOSIS — J45909 Unspecified asthma, uncomplicated: Secondary | ICD-10-CM

## 2011-12-23 DIAGNOSIS — F411 Generalized anxiety disorder: Secondary | ICD-10-CM

## 2011-12-23 DIAGNOSIS — J019 Acute sinusitis, unspecified: Secondary | ICD-10-CM

## 2011-12-23 MED ORDER — MOMETASONE FUROATE 50 MCG/ACT NA SUSP: 2.0000 | Freq: Every day | NASAL | Status: DC

## 2011-12-23 MED ORDER — AMOXICILLIN-POT CLAVULANATE 875-125 MG PO TABS: 1.0000 | ORAL_TABLET | Freq: Two times a day (BID) | ORAL | Status: AC

## 2011-12-23 NOTE — Patient Instructions (Addendum)
Take all new medications as prescribed - the antibiotic, and the sample nasonex (at one spray each side twice per day) Continue all other medications as before, including the allegra Please call if not improved in 1 wk for referral to Dr Edd Fabian Please keep your appointments with your specialists as you have planned - mar 27 dietary appt Please return in October 2013 for your yearly visit, or sooner if needed, with Lab testing done 3-5 days before

## 2011-12-24 ENCOUNTER — Encounter: Payer: Self-pay | Admitting: Internal Medicine

## 2011-12-24 NOTE — Assessment & Plan Note (Signed)
stable overall by hx and exam, most recent data reviewed with pt, and pt to continue medical treatment as before  Lab Results  Component Value Date   WBC 6.7 07/28/2011   HGB 13.5 07/28/2011   HCT 40.5 07/28/2011   PLT 260.0 07/28/2011   GLUCOSE 88 07/28/2011   CHOL 168 07/28/2011   TRIG 49.0 07/28/2011   HDL 74.20 07/28/2011   LDLCALC 84 07/28/2011   ALT 18 07/28/2011   AST 20 07/28/2011   NA 141 07/28/2011   K 4.3 07/28/2011   CL 105 07/28/2011   CREATININE 1.0 07/28/2011   BUN 13 07/28/2011   CO2 29 07/28/2011   TSH 2.07 07/28/2011   HGBA1C 5.3 07/28/2011    

## 2011-12-24 NOTE — Assessment & Plan Note (Addendum)
Mild to mod, to add flonase asd  to f/u any worsening symptoms or concerns, gave sample nasonex as well

## 2011-12-24 NOTE — Assessment & Plan Note (Signed)
stable overall by hx and exam, most recent data reviewed with pt, and pt to continue medical treatment as before  SpO2 Readings from Last 3 Encounters:  12/23/11 98%  11/28/11 98%  08/01/11 96%

## 2011-12-24 NOTE — Progress Notes (Signed)
Subjective:    Patient ID: Suzanne Hansen, female    DOB: Dec 11, 1955, 56 y.o.   MRN: [id number]  HPI   Here with 3 days acute onset fever, left facial pain, left ear pressure and pain, general weakness and malaise, and greenish d/c, with slight ST, but little to no cough and Pt denies chest pain, increased sob or doe, wheezing, orthopnea, PND, increased LE swelling, palpitations, dizziness or syncope. Pt denies new neurological symptoms such as new headache, or facial or extremity weakness or numbness   Pt denies polydipsia, polyuria.  Does have several wks ongoing nasal allergy symptoms with clear congestion, itch and sneeze, without fever, pain, ST, cough or wheezing despite current meds as well.  Denies worsening depressive symptoms, suicidal ideation, or panic, though has ongoing anxiety, not increased recently.  Past Medical History  Diagnosis Date  . Hypoactive thyroid   . Asthma   . Allergy   . HERPES SIMPLEX, UNCOMPLICATED 05/23/2007  . HYPERTHYROIDISM 05/23/2007  . HYPOTHYROIDISM 05/09/2008  . VITAMIN D DEFICIENCY 08/28/2009  . GLUCOSE INTOLERANCE 08/28/2009  . OBESITY 05/23/2007  . ANXIETY 05/23/2007  . SINUSITIS- ACUTE-NOS 11/03/2008  . URI 08/28/2009  . ALLERGIC RHINITIS 05/09/2008  . ASTHMA 05/23/2007  . UTI 04/29/2008  . ENDOMETRIOSIS NOS 05/23/2007  . CONTACT DERMATITIS 06/09/2009  . UNSPECIFIED URTICARIA 06/04/2009  . SHOULDER PAIN, LEFT 05/17/2010  . LATERAL EPICONDYLITIS, RIGHT 03/10/2009  . ARM PAIN, LEFT 10/23/2009  . FACIAL PAIN 06/02/2010  . Wheezing 07/12/2010  . Unspecified Chest Pain 07/25/2008  . FREQUENCY, URINARY 05/17/2010  . INSOMNIA, HX OF 05/23/2007  . LIPOMA 10/22/2010  . SKIN LESION 11/10/2010  . NECK PAIN 11/10/2010  . Impaired glucose tolerance 01/28/2011   Past Surgical History  Procedure Date  . Endometrial ablation   . Wisdom tooth extraction   . Uterine fibroid surgery   . Laminectomy 1996  . Laparotomy     exploratory  . S/p endometrial ablation 12/06   . Tubal ligation     reports that she has never smoked. She does not have any smokeless tobacco history on file. She reports that she does not use illicit drugs. Her alcohol history not on file. family history includes Cancer in her maternal grandmother; Diabetes in her cousin; Heart disease in her cousin; and Hypertension in her cousin. Allergies  Allergen Reactions  . Clarithromycin     REACTION: nausea  vomiting  . Metronidazole Nausea And Vomiting   Current Outpatient Prescriptions on File Prior to Visit  Medication Sig Dispense Refill  . aspirin 81 MG EC tablet Take 81 mg by mouth daily.        . Calcium Carbonate (CALTRATE 600) 1500 MG TABS Take by mouth daily.        [name] estradiol (VIVELLE-DOT) 0.1 MG/24HR Place 1 patch onto the skin 2 (two) times a week.        . fexofenadine (ALLEGRA) 180 MG tablet Take 180 mg by mouth daily.        [name] levothyroxine (SYNTHROID, LEVOTHROID) 137 MCG tablet Take 137 mcg by mouth daily.      . Multiple Vitamin (MULTIVITAMIN) capsule Take 1 capsule by mouth daily.        [name] PROAIR HFA 108 (90 BASE) MCG/ACT inhaler INHALE TWO PUFFS 4 TIMES DAILY AS NEEDED  9 g  10  . SYMBICORT 160-4.5 MCG/ACT inhaler INHALE TWO PUFFS BY MOUTH TWICE DAILY  11 g  10  . chlorpheniramine-HYDROcodone (TUSSIONEX PENNKINETIC ER) 10-8 MG/5ML LQCR Take 5  mLs by mouth every 12 (twelve) hours as needed.  140 mL  1  . metFORMIN (GLUCOPHAGE) 500 MG tablet Take 500 mg by mouth 2 (two) times daily with a meal.      . multivitamin (METANX) 3-35-2 MG TABS tablet Take 1 tablet by mouth 2 (two) times daily.      . traMADol (ULTRAM) 50 MG tablet Take 50 mg by mouth.         Review of Systems Review of Systems  Constitutional: Negative for diaphoresis and unexpected weight change.  HENT: Negative for drooling and tinnitus.   Eyes: Negative for photophobia and visual disturbance.  Respiratory: Negative for choking and stridor.   Gastrointestinal: Negative for vomiting and blood in stool.    Genitourinary: Negative for hematuria and decreased urine volume.     Objective:   Physical Exam BP 102/68  Pulse 68  Temp(Src) 98.7 F (37.1 C) (Oral)  Ht 5' 4.5" (1.638 m)  Wt 210 lb 6 oz (95.425 kg)  BMI 35.55 kg/m2  SpO2 98% Physical Exam  VS noted, mild ill Constitutional: Pt appears well-developed and well-nourished.  HENT: Head: Normocephalic.  Right Ear: External ear normal.  Left Ear: External ear normal. \Bilat tm's mild erythema left > right.  Sinus marked tender left max sinus area.  Pharynx mild erythema Eyes: Conjunctivae and EOM are normal. Pupils are equal, round, and reactive to light.  Neck: Normal range of motion. Neck supple.  Cardiovascular: Normal rate and regular rhythm.   Pulmonary/Chest: Effort normal and breath sounds normal.  Neurological: Pt is alert. No cranial nerve deficit.  Skin: Skin is warm. No erythema.  Psychiatric: Pt behavior is normal. Thought content normal. 1+ nervous, not depressed appearing    Assessment & Plan:

## 2011-12-24 NOTE — Assessment & Plan Note (Signed)
Mild to mod, for antibx course,  to f/u any worsening symptoms or concerns 

## 2012-01-04 ENCOUNTER — Encounter: Payer: BC Managed Care – PPO | Attending: Endocrinology | Admitting: *Deleted

## 2012-01-04 ENCOUNTER — Encounter: Payer: Self-pay | Admitting: *Deleted

## 2012-01-04 DIAGNOSIS — E119 Type 2 diabetes mellitus without complications: Secondary | ICD-10-CM | POA: Insufficient documentation

## 2012-01-04 DIAGNOSIS — Z713 Dietary counseling and surveillance: Secondary | ICD-10-CM | POA: Insufficient documentation

## 2012-01-04 NOTE — Progress Notes (Addendum)
Medical Nutrition Therapy:  Appt start time: 1030 end time:  1130.  Assessment:  Primary concerns today: T2DM.  Pt here for T2DM education with an A1c of 5.7%.  Reports a strong family hx of T2DM as well as pulmonary and heart issues.  Both parents deceased at early age. Pt reports FBG of 85 mg and 110 mg 1.5-2 hours after hs snack. Is not currently counting CHO and unable to exercise since last summer d/t plantar fasciitis (just now healing). Unable to obtain full recall, but pt states she drinks water, diet coke, and sweet tea. Reports she thinks she eats large portions of CHO at times.   MEDICATIONS: See medication list.   DIETARY INTAKE:  Usual eating pattern includes 2-3 meals and 3 snacks per day.  24-hr recall: Not completed d/t lack of time.   Usual physical activity: None d/t plantar fasciitis  Estimated energy needs: 1200-1300 calories (30g/meal; 15g/snack) 135-145 g carbohydrates 90-95 g protein 35 g fat  Progress Towards Goal(s):  In progress.   Nutritional Diagnosis:  [place]-2.1 Impaired nutrient utilization related to glucose metabolism as evidenced by an elevated A1c and MD referral for DM education.    Intervention:  Nutrition education (see patient goals).  Handouts given during visit include:  Living Well with Diabetes - [employer]  Target Blood Glucose Levels  Snack List handout  30g CHO meals handout  Monitoring/Evaluation:  Dietary intake, exercise, A1c, BG trends, and body weight in 4 week(s).

## 2012-01-04 NOTE — Patient Instructions (Addendum)
Goals:  Follow Diabetes Meal Plan as instructed (see yellow card)  Eat 3 meals and 2 snacks, every 3-5 hrs - AVOID meal skipping  Add lean protein foods to all meals/snacks  Monitor glucose levels as instructed by your doctor  Bring glucose log to your next nutrition visit  Aim for >30 mins of physical activity daily

## 2012-02-06 ENCOUNTER — Encounter: Payer: Self-pay | Admitting: *Deleted

## 2012-02-06 ENCOUNTER — Encounter: Payer: BC Managed Care – PPO | Attending: Endocrinology | Admitting: *Deleted

## 2012-02-06 DIAGNOSIS — E119 Type 2 diabetes mellitus without complications: Secondary | ICD-10-CM | POA: Insufficient documentation

## 2012-02-06 DIAGNOSIS — Z713 Dietary counseling and surveillance: Secondary | ICD-10-CM | POA: Insufficient documentation

## 2012-02-06 NOTE — Progress Notes (Signed)
Medical Nutrition Therapy:  Appt start time:  315  End time:  345.  Primary concerns today: T2DM, Follow up.  Pt reports fasting BGs in the 80s and a recent A1c of ~5.2%.  Has started eating smaller portions at meals and making good food choices. Has decreased intake of diet coke and now drinks mainly water. Foot has healed and she is now able to exercise, though reports too difficult to make it to the gym d/t her school schedule. Plans to pick that up more this summer.    MEDICATIONS: See medication list.   DIETARY INTAKE:  Usual eating pattern includes 3 meals and 2-3 snacks per day.  24-hr recall:  B:  Oatmeal w/ splenda OR 1/2 cinnamon bagel w/ LF cream cheese S:  Almonds (~10) and pineapple OR tangerine OR grapes  L:  [place] sandwich on wheat, mustard; a few Baked Lays S:  Almonds w/ 1/2 banana D:  Side salad w/ 3 pc grilled chicken ([employer])  Beverages:  diet coke, water  Usual physical activity:  Joined [employer] recently and tried water aerobics. States she does not have time to exercise right now d/t her busy schedule at school.   Estimated energy needs: 1200-1300 calories (30g/meal; 15g/snack) 135-145 g carbohydrates 90-95 g protein 35 g fat  Progress Towards Goal(s):  In progress.   Nutritional Diagnosis:  [place]-2.1 Impaired nutrient utilization related to glucose metabolism as evidenced by an elevated A1c and MD referral for DM education.    Intervention:  Nutrition education (see patient goals).  Samples given during visit include:  Celebrate Sublingual B-12: 3 ea Lot # [id number]; Exp: 07/14  Celebrate MVI (Pineapple-Strawberry): 1 ea Lot # [id number]; Exp: 09/14  Monitoring/Evaluation:  Dietary intake, exercise, A1c, BG trends, and body weight in 3 months.

## 2012-02-06 NOTE — Patient Instructions (Signed)
Goals:  Continue previous goals.   Try sublingual form of B-12 (or B-Complex)  Increase exercise to >45 min most days of the week. 

## 2012-05-07 ENCOUNTER — Encounter: Payer: BC Managed Care – PPO | Attending: Internal Medicine | Admitting: *Deleted

## 2012-05-07 ENCOUNTER — Encounter: Payer: Self-pay | Admitting: *Deleted

## 2012-05-07 VITALS — Ht 64.5 in | Wt 203.5 lb

## 2012-05-07 DIAGNOSIS — Z713 Dietary counseling and surveillance: Secondary | ICD-10-CM | POA: Insufficient documentation

## 2012-05-07 DIAGNOSIS — R7302 Impaired glucose tolerance (oral): Secondary | ICD-10-CM

## 2012-05-07 DIAGNOSIS — R7309 Other abnormal glucose: Secondary | ICD-10-CM | POA: Insufficient documentation

## 2012-05-07 NOTE — Patient Instructions (Addendum)
Goals:  Continue previous goals.   Try sublingual form of B-12 (or B-Complex)  Increase exercise to >45 min most days of the week. 

## 2012-05-07 NOTE — Progress Notes (Signed)
Medical Nutrition Therapy:  Appt start time:  315  End time:  345.  Primary concerns today: T2DM, Follow up.  Suzanne Hansen returns today for f/u and reports FBGs continue in the "upper 70s and low 80s". States, however, that she has not checked them "in months".  Gave new meter (OneTouch Ultra Mini); pt checked her BG in office = 103 mg/dL (3 hrs PP). Went on cruise with daughters in June and ate/drank whatever she wanted, though able to maintain weight. Has now returned to previous dietary intake.     TANITA  BODY COMP RESULTS  05/07/12   %Fat 49.4%   Fat Mass (lbs) 100.5   Fat Free Mass (lbs) 103.0   Total Body Water (lbs) 75.5   MEDICATIONS: See medication list. No longer taking Qsymia d/t SOB.    DIETARY INTAKE:  Usual eating pattern includes 3 meals and 2 snacks per day.  24-hr recall:  B: Oatmeal w/ splenda  S: Almonds (~10) and fruit L: Salad with grilled chicken ([place] salad @ [employer]) S: None D: Baked chicken with brown rice, salad or mixed veggies S: Yoplait yogurt OR lite popcorn OR 1 cup sherbert  Beverages:  diet coke, water  Usual physical activity:  [facility] not been [facility] pool not being open for free swim when she is able to go. Continues to walk dog daily and states she gets a good workout.  Estimated energy needs: 1200-1300 calories (30g/meal; 15g/snack) 135-145 g carbohydrates 90-95 g protein 35 g fat  Samples given at visit:   OneTouch Ultra Mini (starter kit): 1 ea Lot # [id number]; Exp: 01/14  Celebrate Vitamins MVI Complete [facility]): 2 ea Lot # [id number]; Exp: 11/14  Progress Towards Goal(s):  In progress.   Nutritional Diagnosis:  [place]-2.1 Impaired nutrient utilization related to glucose metabolism as evidenced by an elevated A1c and MD referral for DM education.    Intervention:  Nutrition education (see patient goals).  Monitoring/Evaluation:  Dietary intake, exercise, A1c, BG trends, and body weight in 3 months.

## 2012-06-25 ENCOUNTER — Ambulatory Visit (INDEPENDENT_AMBULATORY_CARE_PROVIDER_SITE_OTHER): Payer: BC Managed Care – PPO | Admitting: Internal Medicine

## 2012-06-25 ENCOUNTER — Encounter: Payer: Self-pay | Admitting: Internal Medicine

## 2012-06-25 VITALS — BP 110/80 | HR 81 | Temp 99.2°F | Ht 66.0 in | Wt 205.4 lb

## 2012-06-25 DIAGNOSIS — J45909 Unspecified asthma, uncomplicated: Secondary | ICD-10-CM

## 2012-06-25 DIAGNOSIS — R7309 Other abnormal glucose: Secondary | ICD-10-CM

## 2012-06-25 DIAGNOSIS — R7302 Impaired glucose tolerance (oral): Secondary | ICD-10-CM

## 2012-06-25 DIAGNOSIS — J029 Acute pharyngitis, unspecified: Secondary | ICD-10-CM

## 2012-06-25 MED ORDER — AZITHROMYCIN 250 MG PO TABS: ORAL_TABLET | ORAL | Status: DC

## 2012-06-25 NOTE — Assessment & Plan Note (Signed)
stable overall by hx and exam, most recent data reviewed with pt, and pt to continue medical treatment as before SpO2 Readings from Last 3 Encounters:  06/25/12 96%  12/23/11 98%  11/28/11 98%

## 2012-06-25 NOTE — Progress Notes (Signed)
Subjective:    Patient ID: Suzanne Hansen, female    DOB: 1956-07-28, 56 y.o.   MRN: [id number]  HPI   Here with 3 days acute onset fever, severe ST, mild facial pain, pressure, general weakness and malaise, but little to no cough and Pt denies chest pain, increased sob or doe, wheezing, orthopnea, PND, increased LE swelling, palpitations, dizziness or syncope.  Pt denies new neurological symptoms such as new headache, or facial or extremity weakness or numbness   Pt denies polydipsia, polyuria.  Denies worsening depressive symptoms, suicidal ideation, or panic.   Pt denies fever, wt loss, night sweats, loss of appetite, or other constitutional symptoms excepr for the above Past Medical History  Diagnosis Date  . Hypoactive thyroid   . Asthma   . Allergy   . HERPES SIMPLEX, UNCOMPLICATED 05/23/2007  . HYPERTHYROIDISM 05/23/2007  . HYPOTHYROIDISM 05/09/2008  . VITAMIN D DEFICIENCY 08/28/2009  . GLUCOSE INTOLERANCE 08/28/2009  . OBESITY 05/23/2007  . ANXIETY 05/23/2007  . SINUSITIS- ACUTE-NOS 11/03/2008  . URI 08/28/2009  . ALLERGIC RHINITIS 05/09/2008  . ASTHMA 05/23/2007  . UTI 04/29/2008  . ENDOMETRIOSIS NOS 05/23/2007  . CONTACT DERMATITIS 06/09/2009  . UNSPECIFIED URTICARIA 06/04/2009  . SHOULDER PAIN, LEFT 05/17/2010  . LATERAL EPICONDYLITIS, RIGHT 03/10/2009  . ARM PAIN, LEFT 10/23/2009  . FACIAL PAIN 06/02/2010  . Wheezing 07/12/2010  . Unspecified Chest Pain 07/25/2008  . FREQUENCY, URINARY 05/17/2010  . INSOMNIA, HX OF 05/23/2007  . LIPOMA 10/22/2010  . SKIN LESION 11/10/2010  . NECK PAIN 11/10/2010  . Impaired glucose tolerance 01/28/2011  . Diabetes mellitus   . Plantar fasciitis     Both feet   Past Surgical History  Procedure Date  . Endometrial ablation   . Wisdom tooth extraction   . Uterine fibroid surgery   . Laminectomy 1996  . Laparotomy     exploratory  . S/p endometrial ablation 12/06  . Tubal ligation     reports that she has never smoked. She does not have any  smokeless tobacco history on file. She reports that she does not use illicit drugs. Her alcohol history not on file. family history includes COPD in her father; Cancer in her maternal grandmother; Diabetes in her cousin, mother, and sister; Heart disease in her cousin and mother; Hypertension in her cousin and father; and Kidney disease in her sister. Allergies  Allergen Reactions  . Clarithromycin     REACTION: nausea  vomiting  . Metronidazole Nausea And Vomiting   Current Outpatient Prescriptions on File Prior to Visit  Medication Sig Dispense Refill  . aspirin 81 MG EC tablet Take 81 mg by mouth daily.        [name] b complex vitamins tablet Take 1 tablet by mouth daily.      . Calcium Citrate-Vitamin D (CITRACAL PETITES/VITAMIN D PO) Take 1 tablet by mouth daily.      . chlorpheniramine-HYDROcodone (TUSSIONEX PENNKINETIC ER) 10-8 MG/5ML LQCR Take 5 mLs by mouth every 12 (twelve) hours as needed.  140 mL  1  . estradiol (VIVELLE-DOT) 0.1 MG/24HR Place 1 patch onto the skin 2 (two) times a week.        . fexofenadine (ALLEGRA) 180 MG tablet Take 180 mg by mouth daily.        [name] levothyroxine (SYNTHROID, LEVOTHROID) 125 MCG tablet Take 125 mcg by mouth. Take daily M-F only      . levothyroxine (SYNTHROID, LEVOTHROID) 137 MCG tablet Take 137 mcg by mouth. Take weekly on  Saturday and Sunday only.      . metFORMIN (GLUCOPHAGE) 500 MG tablet Take 1,000 mg by mouth daily.       . mometasone (NASONEX) 50 MCG/ACT nasal spray Place 2 sprays into the nose daily.  17 g  5  . Multiple Vitamin (MULTIVITAMIN) capsule Take 1 capsule by mouth daily.        . Phentermine-Topiramate (QSYMIA) 3.75-23 MG CP24 Take 1 capsule by mouth daily.      [name] PROAIR HFA 108 (90 BASE) MCG/ACT inhaler INHALE TWO PUFFS 4 TIMES DAILY AS NEEDED  9 g  10  . SYMBICORT 160-4.5 MCG/ACT inhaler INHALE TWO PUFFS BY MOUTH TWICE DAILY  11 g  10   Review of Systems  Constitutional: Negative for diaphoresis and unexpected weight change.    HENT: Negative for tinnitus.   Eyes: Negative for photophobia and visual disturbance.  Respiratory: Negative for choking and stridor.   Gastrointestinal: Negative for vomiting and blood in stool.  Genitourinary: Negative for hematuria and decreased urine volume.  Musculoskeletal: Negative for gait problem.  Skin: Negative for color change and wound.  Neurological: Negative for tremors and numbness.  Psychiatric/Behavioral: Negative for decreased concentration. The patient is not hyperactive.      Objective:   Physical Exam BP 110/80  Pulse 81  Temp 99.2 F (37.3 C) (Oral)  Ht 5\' 6"  (1.676 m)  Wt 205 lb 6 oz (93.157 kg)  BMI 33.15 kg/m2  SpO2 96% Physical Exam  VS noted, mild ill Constitutional: Pt appears well-developed and well-nourished.  HENT: Head: Normocephalic.  Right Ear: External ear normal.  Left Ear: External ear normal.  Bilat tm's mild erythema.  Sinus nontender.  Pharynx severe erythema with exudate Eyes: Conjunctivae and EOM are normal. Pupils are equal, round, and reactive to light.  Neck: Normal range of motion. Neck supple.  Cardiovascular: Normal rate and regular rhythm.   Pulmonary/Chest: Effort normal and breath sounds normal.  Neurological: Pt is alert. Not confused  Skin: Skin is warm. No erythema.  Psychiatric: Pt behavior is normal. Thought content normal. not nervous or depressed affect    Assessment & Plan:

## 2012-06-25 NOTE — Assessment & Plan Note (Signed)
stable overall by hx and exam, most recent data reviewed with pt, and pt to continue medical treatment as before Lab Results  Component Value Date   HGBA1C 5.3 07/28/2011

## 2012-06-25 NOTE — Assessment & Plan Note (Signed)
Mild to mod, for antibx course,  to f/u any worsening symptoms or concerns 

## 2012-06-25 NOTE — Patient Instructions (Addendum)
Take all new medications as prescribed Continue all other medications as before  

## 2012-07-12 ENCOUNTER — Telehealth: Payer: Self-pay | Admitting: Internal Medicine

## 2012-07-12 MED ORDER — TRIAMCINOLONE ACETONIDE 0.1 % MT PSTE: PASTE | OROMUCOSAL | Status: DC

## 2012-07-12 MED ORDER — VALACYCLOVIR HCL 1 G PO TABS: 1000.0000 mg | ORAL_TABLET | Freq: Three times a day (TID) | ORAL | Status: DC

## 2012-07-12 NOTE — Telephone Encounter (Signed)
Caller: [name]/Patient; Patient Name: Suzanne Hansen; PCP: Oliver Barre (Adults only); Best Callback Phone Number: [phone] Onset-07/02/12   Afebrile. Pt has a cold sore on her lip that she has tried treating with alcohol, Campho Phenique   and Blistex. She has tried Abreva in the past and she states it does not work. The cold sore is about 1/4 inch in size. Pt states she had a cold sore about a month ago.  Emergent s/s of Cold sores protocol r/o. Pt to see provider within 2 weeks. Pt declines an appointment.  Pt is requesting a message to provider to see if Valtrex or a cream can be called in or both. [facility]

## 2012-07-12 NOTE — Telephone Encounter (Signed)
Ok for valtrex and Kenalog in orabase, but would need OV if not improving by next monday

## 2012-07-13 NOTE — Telephone Encounter (Signed)
Patient informed. 

## 2012-07-30 ENCOUNTER — Encounter: Payer: Self-pay | Admitting: Internal Medicine

## 2012-07-30 ENCOUNTER — Telehealth: Payer: Self-pay

## 2012-07-30 MED ORDER — VALACYCLOVIR HCL 1 G PO TABS: 1000.0000 mg | ORAL_TABLET | Freq: Three times a day (TID) | ORAL | Status: DC

## 2012-07-30 NOTE — Telephone Encounter (Signed)
Patient informed. 

## 2012-07-30 NOTE — Telephone Encounter (Signed)
Done erx 

## 2012-07-30 NOTE — Telephone Encounter (Signed)
Pt called c/o similar sxs of recent fever blister to the genital anal area. Pt is requesting a refill of Valtrex, please advise.

## 2012-07-30 NOTE — Telephone Encounter (Signed)
ERROR

## 2012-08-01 ENCOUNTER — Other Ambulatory Visit: Payer: Self-pay

## 2012-08-01 MED ORDER — BUDESONIDE-FORMOTEROL FUMARATE 160-4.5 MCG/ACT IN AERO: 2.0000 | INHALATION_SPRAY | Freq: Two times a day (BID) | RESPIRATORY_TRACT | Status: DC

## 2012-08-01 MED ORDER — ALBUTEROL SULFATE HFA 108 (90 BASE) MCG/ACT IN AERS: 2.0000 | INHALATION_SPRAY | Freq: Four times a day (QID) | RESPIRATORY_TRACT | Status: DC

## 2012-08-06 ENCOUNTER — Encounter: Payer: Self-pay | Admitting: *Deleted

## 2012-08-06 ENCOUNTER — Encounter: Payer: BC Managed Care – PPO | Attending: Internal Medicine | Admitting: *Deleted

## 2012-08-06 VITALS — Ht 64.5 in | Wt 206.5 lb

## 2012-08-06 DIAGNOSIS — Z713 Dietary counseling and surveillance: Secondary | ICD-10-CM | POA: Insufficient documentation

## 2012-08-06 DIAGNOSIS — E119 Type 2 diabetes mellitus without complications: Secondary | ICD-10-CM | POA: Insufficient documentation

## 2012-08-06 NOTE — Progress Notes (Signed)
Medical Nutrition Therapy:  Appt start time:  10:40  End time:  11:10.  Primary concerns today: T2DM, Follow up.  Zarra returns today for f/u and reports eating habits were not great during the month of 05-26-23 after her sister died from complications of T2DM.  Is back on track now; counting CHO. States MD said she is doing very well and does not need to check BGs at this time.   TANITA  BODY COMP RESULTS  05/07/12 08/06/12   %Fat 49.4% 50.2%   Fat Mass (lbs) 100.5 103.5   Fat Free Mass (lbs) 103.0 103.0   Total Body Water (lbs) 75.5 75.5   MEDICATIONS: See medication list.    DIETARY INTAKE:  Usual eating pattern includes 3 meals and 2 snacks per day.  24-hr recall: (no real changes except when sister died in 26-May-2023; back to usual intake now) B: Oatmeal w/ splenda OR banana with almonds OR 1 pcs bacon w/ 1 pc of toast S: Almonds (~10) and fruit OR granola bar (oats & honey) L: Salad with grilled chicken (Cobb salad @ [employer]) S: None D: Baked chicken or fried fish with brown rice, salad or mixed veggies/cabbage OR homemade soup (vegetable) S: Yoplait yogurt OR lite popcorn OR 1 cup sherbert  Beverages: coffee, water  Usual physical activity: Continues to walk dog daily and states she gets a good workout.  Estimated energy needs: 1200-1300 calories (30g/meal; 15g/snack) 135-145 g carbohydrates 90-95 g protein 35 g fat  Progress Towards Goal(s):  In progress.   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    Intervention:  Nutrition education (see patient goals).  Monitoring/Evaluation:  Dietary intake, exercise, A1c, and body weight in 3 months.

## 2012-08-06 NOTE — Patient Instructions (Addendum)
Goals:  Continue previous goals.   Try sublingual form of B-12 (or B-Complex)  Increase exercise to >45 min most days of the week.

## 2012-08-27 ENCOUNTER — Other Ambulatory Visit: Payer: Self-pay | Admitting: Internal Medicine

## 2012-09-01 ENCOUNTER — Other Ambulatory Visit: Payer: Self-pay | Admitting: Internal Medicine

## 2012-11-16 ENCOUNTER — Telehealth: Payer: Self-pay

## 2012-11-16 NOTE — Telephone Encounter (Signed)
Message copied by Pincus Sanes on Fri Nov 16, 2012  8:09 AM ------      Message from: Corwin Levins      Created: Thu Nov 15, 2012  4:58 PM       ok      ----- Message -----         From: Vladimir Crofts Maddeline Roorda         Sent: 11/15/2012   4:36 PM           To: Corwin Levins, MD            The patient has a daughter Akeyla Molden (18 yrs. Old). The mother would like to know if you would be willing to see her daughter since you are her PCP? Call back number is [phone]

## 2012-11-16 NOTE — Telephone Encounter (Signed)
Informed the mother ok with PCP to see daughter for future appointment.

## 2012-11-27 ENCOUNTER — Encounter: Payer: Self-pay | Admitting: Internal Medicine

## 2012-11-27 ENCOUNTER — Ambulatory Visit (INDEPENDENT_AMBULATORY_CARE_PROVIDER_SITE_OTHER): Payer: BC Managed Care – PPO | Admitting: Internal Medicine

## 2012-11-27 VITALS — BP 110/70 | HR 69 | Temp 97.9°F | Ht 65.0 in | Wt 212.1 lb

## 2012-11-27 DIAGNOSIS — R229 Localized swelling, mass and lump, unspecified: Secondary | ICD-10-CM

## 2012-11-27 DIAGNOSIS — J45909 Unspecified asthma, uncomplicated: Secondary | ICD-10-CM

## 2012-11-27 DIAGNOSIS — D179 Benign lipomatous neoplasm, unspecified: Secondary | ICD-10-CM

## 2012-11-27 DIAGNOSIS — J069 Acute upper respiratory infection, unspecified: Secondary | ICD-10-CM

## 2012-11-27 DIAGNOSIS — Z0000 Encounter for general adult medical examination without abnormal findings: Secondary | ICD-10-CM

## 2012-11-27 MED ORDER — AZITHROMYCIN 250 MG PO TABS: ORAL_TABLET | ORAL | Status: DC

## 2012-11-27 NOTE — Patient Instructions (Signed)
Please take all new medication as prescribed You will be contacted regarding the referral for: general surgury Thank you for enrolling in MyChart. Please follow the instructions below to securely access your online medical record. MyChart allows you to send messages to your doctor, view your test results, renew your prescriptions, schedule appointments, and more. To Log into My Chart online, please go by [employer] or [employer] to [email or web address], or download the MyChart App from the [employer] of [employer] .  Your Username is: [email or web address] (pass [email or web address]) Please send a practice Message on Mychart later today. Please return in 6 months, or sooner if needed, with Lab testing done 3-5 days before

## 2012-11-27 NOTE — Progress Notes (Signed)
Subjective:    Patient ID: Suzanne Hansen, female    DOB: May 13, 1956, 57 y.o.   MRN: [id number]  HPI   Here with 2-3 days acute onset fever, mild facial pain, pressure, headache, general weakness and malaise, and greenish d/c, with mild ST and cough, but pt denies chest pain, wheezing, increased sob or doe, orthopnea, PND, increased LE swelling, palpitations, dizziness or syncope.  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  No other neck mass, hoarseness, neck pain or radicular symptoms.   Now on ERT and feeling better with less hot flashes.   Past Medical History  Diagnosis Date  . Hypoactive thyroid   . Asthma   . Allergy   . HERPES SIMPLEX, UNCOMPLICATED 05/23/2007  . HYPERTHYROIDISM 05/23/2007  . HYPOTHYROIDISM 05/09/2008  . VITAMIN D DEFICIENCY 08/28/2009  . GLUCOSE INTOLERANCE 08/28/2009  . OBESITY 05/23/2007  . ANXIETY 05/23/2007  . SINUSITIS- ACUTE-NOS 11/03/2008  . URI 08/28/2009  . ALLERGIC RHINITIS 05/09/2008  . ASTHMA 05/23/2007  . UTI 04/29/2008  . ENDOMETRIOSIS NOS 05/23/2007  . CONTACT DERMATITIS 06/09/2009  . UNSPECIFIED URTICARIA 06/04/2009  . SHOULDER PAIN, LEFT 05/17/2010  . LATERAL EPICONDYLITIS, RIGHT 03/10/2009  . ARM PAIN, LEFT 10/23/2009  . FACIAL PAIN 06/02/2010  . Wheezing 07/12/2010  . Unspecified Chest Pain 07/25/2008  . FREQUENCY, URINARY 05/17/2010  . INSOMNIA, HX OF 05/23/2007  . LIPOMA 10/22/2010  . SKIN LESION 11/10/2010  . NECK PAIN 11/10/2010  . Impaired glucose tolerance 01/28/2011  . Diabetes mellitus   . Plantar fasciitis     Both feet   Past Surgical History  Procedure Laterality Date  . Endometrial ablation    . Wisdom tooth extraction    . Uterine fibroid surgery    . Laminectomy  1996  . Laparotomy      exploratory  . S/p endometrial ablation  12/06  . Tubal ligation      reports that she has never smoked. She does not have any smokeless tobacco history on file. She  reports that she does not use illicit drugs. Her alcohol history is not on file. family history includes COPD in her father; Cancer in her maternal grandmother; Diabetes in her cousin, mother, and sister; Heart disease in her cousin and mother; Hypertension in her cousin and father; and Kidney disease in her sister. Allergies  Allergen Reactions  . Clarithromycin     REACTION: nausea  vomiting  . Metronidazole Nausea And Vomiting   Current Outpatient Prescriptions on File Prior to Visit  Medication Sig Dispense Refill  . aspirin 81 MG EC tablet Take 81 mg by mouth daily.        [name] b complex vitamins tablet Take 1 tablet by mouth daily.      . chlorpheniramine-HYDROcodone (TUSSIONEX PENNKINETIC ER) 10-8 MG/5ML LQCR Take 5 mLs by mouth every 12 (twelve) hours as needed.  140 mL  1  . fexofenadine (ALLEGRA) 180 MG tablet Take 180 mg by mouth daily.        [name] levothyroxine (SYNTHROID, LEVOTHROID) 112 MCG tablet Take 112 mcg by mouth daily.      . metFORMIN (GLUCOPHAGE) 500 MG tablet Take 1,000 mg by mouth daily.       . Multiple Vitamin (MULTIVITAMIN) capsule Take 1 capsule by mouth daily.        . Calcium Citrate-Vitamin D (CITRACAL PETITES/VITAMIN D PO) Take 1 tablet by mouth daily.      [name]  mometasone (NASONEX) 50 MCG/ACT nasal spray Place 2 sprays into the nose daily.  17 g  5  . triamcinolone (KENALOG) 0.1 % paste Use asd to affected area twice per day as needed  5 g  12   No current facility-administered medications on file prior to visit.    Review of Systems  Constitutional: Negative for unexpected weight change, or unusual diaphoresis  HENT: Negative for tinnitus.   Eyes: Negative for photophobia and visual disturbance.  Respiratory: Negative for choking and stridor.   Gastrointestinal: Negative for vomiting and blood in stool.  Genitourinary: Negative for hematuria and decreased urine volume.  Musculoskeletal: Negative for acute joint swelling Skin: Negative for color change and  wound.  Neurological: Negative for tremors and numbness other than noted  Psychiatric/Behavioral: Negative for decreased concentration or  hyperactivity.       Objective:   Physical Exam BP 110/70  Pulse 69  Temp(Src) 97.9 F (36.6 C) (Oral)  Ht 5\' 5"  (1.651 m)  Wt 212 lb 2 oz (96.219 kg)  BMI 35.3 kg/m2  SpO2 99% VS noted,  Constitutional: Pt appears well-developed and well-nourished.  HENT: Head: NCAT.  Right Ear: External ear normal.  Left Ear: External ear normal.  Eyes: Conjunctivae and EOM are normal. Pupils are equal, round, and reactive to light.  Neck: Normal range of motion. Neck supple.  Cardiovascular: Normal rate and regular rhythm.   Pulmonary/Chest: Effort normal and breath sounds normal.  Neurological: Pt is alert. Not confused  Skin: Skin is warm. No erythema. 4-5 cm nondiscrete lipoma like mass about the left left supraclavicular area;  Also more posteriorly at the trapezoid edge is a subq firm mobile mass approx 1.5 cm discrete Psychiatric: Pt behavior is normal. Thought content normal.     Assessment & Plan:

## 2012-12-02 NOTE — Assessment & Plan Note (Signed)
Increased size per pt, likely distinct from the lipoma, pt requests surgical referral

## 2012-12-02 NOTE — Assessment & Plan Note (Signed)
I cannot appreciate change,  to f/u any worsening symptoms or concerns

## 2012-12-02 NOTE — Assessment & Plan Note (Signed)
stable overall by history and exam, recent data reviewed with pt, and pt to continue medical treatment as before,  to f/u any worsening symptoms or concerns SpO2 Readings from Last 3 Encounters:  11/27/12 99%  06/25/12 96%  12/23/11 98%

## 2012-12-02 NOTE — Assessment & Plan Note (Signed)
Mild to mod, for antibx course,  to f/u any worsening symptoms or concerns 

## 2012-12-03 ENCOUNTER — Encounter (INDEPENDENT_AMBULATORY_CARE_PROVIDER_SITE_OTHER): Payer: Self-pay | Admitting: General Surgery

## 2012-12-03 ENCOUNTER — Ambulatory Visit (INDEPENDENT_AMBULATORY_CARE_PROVIDER_SITE_OTHER): Payer: BC Managed Care – PPO | Admitting: General Surgery

## 2012-12-03 VITALS — BP 126/72 | HR 76 | Temp 97.4°F | Resp 16 | Ht 65.0 in | Wt 209.6 lb

## 2012-12-03 DIAGNOSIS — D179 Benign lipomatous neoplasm, unspecified: Secondary | ICD-10-CM

## 2012-12-03 NOTE — Patient Instructions (Signed)
Will refer to ENTor Thoracic

## 2012-12-03 NOTE — Progress Notes (Signed)
Subjective:     Patient ID: Suzanne Hansen, female   DOB: October 23, 1955, 57 y.o.   MRN: [id number]  HPI We're asked to see the patient in consultation by Dr. [name] to evaluate her for lipomas. The patient is a 57 year old black female who last few weeks has noticed some swelling at the base of her left neck and on her upper left back. These areas were actually noticed by her massage therapist. She denies any pain. She is not sure how long he has been present. She has no problems with range of motion in her neck.  Review of Systems  Constitutional: Negative.   HENT: Negative.   Eyes: Negative.   Respiratory: Negative.   Cardiovascular: Negative.   Gastrointestinal: Negative.   Endocrine: Negative.   Genitourinary: Negative.   Musculoskeletal: Negative.   Skin: Negative.   Allergic/Immunologic: Negative.   Neurological: Negative.   Hematological: Negative.   Psychiatric/Behavioral: Negative.        Objective:   Physical Exam  Constitutional: She is oriented to person, place, and time. She appears well-developed and well-nourished.  HENT:  Head: Normocephalic and atraumatic.  Eyes: Conjunctivae and EOM are normal. Pupils are equal, round, and reactive to light.  Neck: Normal range of motion. Neck supple.  There is a broad-based fatty feeling mass at the base of the left neck in the supraclavicular area. There is also a smaller more round mass at the upper edge of her left trapezius muscle  Cardiovascular: Normal rate, regular rhythm and normal heart sounds.   Pulmonary/Chest: Effort normal and breath sounds normal.  Abdominal: Soft. Bowel sounds are normal.  Musculoskeletal: Normal range of motion.  Lymphadenopathy:    She has no cervical adenopathy.  Neurological: She is alert and oriented to person, place, and time.  Skin: Skin is warm and dry.  Psychiatric: She has a normal mood and affect. Her behavior is normal.       Assessment:     The patient has what appears to be a  possible lipoma in the left supraclavicular area     Plan:     Unfortunately this was not an area that general surgeons frequently operated in. Because of this I would like to refer her to either an ENT surgeon or a thoracic surgeon to further evaluate these areas.

## 2012-12-10 ENCOUNTER — Other Ambulatory Visit (INDEPENDENT_AMBULATORY_CARE_PROVIDER_SITE_OTHER): Payer: Self-pay | Admitting: General Surgery

## 2012-12-10 DIAGNOSIS — D179 Benign lipomatous neoplasm, unspecified: Secondary | ICD-10-CM

## 2013-01-01 ENCOUNTER — Encounter: Payer: BC Managed Care – PPO | Admitting: Surgery

## 2013-01-02 ENCOUNTER — Encounter: Payer: Self-pay | Admitting: Surgery

## 2013-01-02 ENCOUNTER — Institutional Professional Consult (permissible substitution) (INDEPENDENT_AMBULATORY_CARE_PROVIDER_SITE_OTHER): Payer: BC Managed Care – PPO | Admitting: Surgery

## 2013-01-02 ENCOUNTER — Encounter: Payer: Self-pay | Admitting: *Deleted

## 2013-01-02 ENCOUNTER — Other Ambulatory Visit: Payer: Self-pay | Admitting: *Deleted

## 2013-01-02 ENCOUNTER — Other Ambulatory Visit: Payer: Self-pay

## 2013-01-02 VITALS — BP 135/87 | HR 72 | Resp 16 | Ht 64.5 in | Wt 205.0 lb

## 2013-01-02 DIAGNOSIS — R222 Localized swelling, mass and lump, trunk: Secondary | ICD-10-CM

## 2013-01-02 DIAGNOSIS — D179 Benign lipomatous neoplasm, unspecified: Secondary | ICD-10-CM

## 2013-01-02 NOTE — Progress Notes (Signed)
[address]       [place] [address]             [phone]       PCP is Oliver Barre, MD Referring Provider is Robyne Askew, MD  Chief Complaint  Patient presents with  . Lipoma    supraclavicular area...eval and treat    HPI:  The patient is a 57 year old woman who reports having a palpable mass in the left supraclavicular region as well as over the left upper back noted in  2011 by her massage therapist. She had a CT scan of the neck performed at that time which did not show any abnormality in the neck. The field of view was coned down to look at the neck so that the left upper back and supraclavicular region were not well visualized. She said that these have been present ever since. Her daughter has noticed some fluctuation in the size of the left supraclavicular mass. She reports occasional tenderness over the mass in the left upper back and some pain shooting up into the back of her neck and head. She has also noticed occasional pains radiating into the left shoulder and some tingling in her left fingers. She was referred to Dr. Carolynne Edouard who asked me to see her.  Past Medical History  Diagnosis Date  . Hypoactive thyroid   . Asthma   . Allergy   . HERPES SIMPLEX, UNCOMPLICATED 05/23/2007  . HYPERTHYROIDISM 05/23/2007  . HYPOTHYROIDISM 05/09/2008  . VITAMIN D DEFICIENCY 08/28/2009  . GLUCOSE INTOLERANCE 08/28/2009  . OBESITY 05/23/2007  . ANXIETY 05/23/2007  . SINUSITIS- ACUTE-NOS 11/03/2008  . URI 08/28/2009  . ALLERGIC RHINITIS 05/09/2008  . ASTHMA 05/23/2007  . UTI 04/29/2008  . ENDOMETRIOSIS NOS 05/23/2007  . CONTACT DERMATITIS 06/09/2009  . UNSPECIFIED URTICARIA 06/04/2009  . SHOULDER PAIN, LEFT 05/17/2010  . LATERAL EPICONDYLITIS, RIGHT 03/10/2009  . ARM PAIN, LEFT 10/23/2009  . FACIAL PAIN 06/02/2010  . Wheezing 07/12/2010  . Unspecified Chest Pain 07/25/2008  . FREQUENCY, URINARY 05/17/2010  . INSOMNIA, HX OF 05/23/2007  . LIPOMA 10/22/2010  . SKIN LESION  11/10/2010  . NECK PAIN 11/10/2010  . Impaired glucose tolerance 01/28/2011  . Diabetes mellitus   . Plantar fasciitis     Both feet    Past Surgical History  Procedure Laterality Date  . Endometrial ablation    . Wisdom tooth extraction    . Uterine fibroid surgery    . Laminectomy  1996  . Laparotomy      exploratory  . S/p endometrial ablation  12/06  . Tubal ligation      Family History  Problem Relation Age of Onset  . Cancer Maternal Grandmother     Breast  . Diabetes Cousin   . Hypertension Cousin   . Heart disease Cousin   . Diabetes Mother   . Heart disease Mother   . COPD Father   . Hypertension Father   . Diabetes Sister   . Kidney disease Sister     Social History History  Substance Use Topics  . Smoking status: Former Smoker -- 0.50 packs/day    Types: Cigarettes    Quit date: 11/05/1987  . Smokeless tobacco: Never Used  . Alcohol Use: No     Comment: WINE    Current Outpatient Prescriptions  Medication Sig Dispense Refill  . aspirin 81 MG EC tablet Take 81 mg by mouth daily.        [name]  Calcium Citrate-Vitamin D (CITRACAL PETITES/VITAMIN D PO) Take 1 tablet by mouth daily.      [name] estradiol-norethindrone (MIMVEY) 1-0.5 MG per tablet Take 1 tablet by mouth daily.      . fexofenadine (ALLEGRA) 180 MG tablet Take 180 mg by mouth daily.        [name] levothyroxine (SYNTHROID, LEVOTHROID) 112 MCG tablet Take 112 mcg by mouth daily.      . metFORMIN (GLUCOPHAGE) 500 MG tablet Take 1,000 mg by mouth daily.       . mometasone (NASONEX) 50 MCG/ACT nasal spray Place 2 sprays into the nose daily.  17 g  5  . Multiple Vitamin (MULTIVITAMIN) capsule Take 1 capsule by mouth daily.        [name] tolterodine (DETROL LA) 2 MG 24 hr capsule Take 2 mg by mouth as needed.      . valACYclovir (VALTREX) 500 MG tablet Take 500 mg by mouth daily.       No current facility-administered medications for this visit.    Allergies  Allergen Reactions  . Clarithromycin     REACTION:  nausea  vomiting  . Metronidazole Nausea And Vomiting    Review of Systems  Constitutional: Positive for unexpected weight change. Negative for fever, chills, diaphoresis, activity change, appetite change and fatigue.       Weight gain.  Eyes: Negative.   Respiratory: Negative.   Cardiovascular: Negative.   Gastrointestinal: Negative.   Endocrine: Negative.   Genitourinary: Positive for frequency.  Musculoskeletal: Negative.   Skin: Negative.   Allergic/Immunologic: Negative.   Neurological: Positive for headaches.  Hematological: Negative.   Psychiatric/Behavioral: Negative.     BP 135/87  Pulse 72  Resp 16  Ht 5' 4.5" (1.638 m)  Wt 205 lb (92.987 kg)  BMI 34.66 kg/m2  SpO2 98% Physical Exam  Constitutional: She is oriented to person, place, and time. She appears well-developed and well-nourished. No distress.  HENT:  Head: Normocephalic.  Mouth/Throat: Oropharynx is clear and moist.  Eyes: Conjunctivae and EOM are normal. Pupils are equal, round, and reactive to light.  Neck: Normal range of motion. Neck supple. No JVD present. No tracheal deviation present. No thyromegaly present.  Slight prominence of the left supraclavicular fat pad compared to the right. There is no discrete mass. Nontender.  Cardiovascular: Normal rate, regular rhythm, normal heart sounds and intact distal pulses.  Exam reveals no gallop and no friction rub.   No murmur heard. Pulmonary/Chest: Effort normal and breath sounds normal.  Firm, mildly tender, non-mobile mass over the left upper back around the superior border of the trapezius. It is about the size of a larger grape.  Abdominal: Soft. Bowel sounds are normal. She exhibits no distension and no mass. There is no tenderness.  Musculoskeletal: She exhibits no edema.  Lymphadenopathy:    She has no cervical adenopathy.  Neurological: She is alert and oriented to person, place, and time. She has normal strength. No cranial nerve deficit or  sensory deficit.  Skin: Skin is warm and dry.  Psychiatric: She has a normal mood and affect.    Impression:  I think the left supraclavicular prominence is probably a large fat pad this probably more visible due to the patient's obesity. The mass on her upper back is discrete, firm, nonmobile, and tender and does not feel like the typical lipoma. I think it would be worthwhile doing a CT scan of the lower neck and upper chest to assess this further.  Plan:  I will plan to see her back in the office after her CT scan is done so we can review the results and decide on a plan of action.

## 2013-01-04 ENCOUNTER — Encounter (HOSPITAL_COMMUNITY): Payer: Self-pay

## 2013-01-04 ENCOUNTER — Ambulatory Visit (HOSPITAL_COMMUNITY)
Admission: RE | Admit: 2013-01-04 | Discharge: 2013-01-04 | Disposition: A | Discharge status: Home / Self Care | Payer: BC Managed Care – PPO | Source: Ambulatory Visit | Attending: Surgery | Admitting: Surgery

## 2013-01-04 ENCOUNTER — Ambulatory Visit
Admission: RE | Admit: 2013-01-04 | Discharge: 2013-01-04 | Disposition: A | Discharge status: Home / Self Care | Payer: BC Managed Care – PPO | Source: Ambulatory Visit | Attending: Surgery | Admitting: Surgery

## 2013-01-04 ENCOUNTER — Other Ambulatory Visit: Payer: Self-pay | Admitting: Surgery

## 2013-01-04 DIAGNOSIS — R222 Localized swelling, mass and lump, trunk: Secondary | ICD-10-CM

## 2013-01-04 DIAGNOSIS — D1779 Benign lipomatous neoplasm of other sites: Secondary | ICD-10-CM | POA: Insufficient documentation

## 2013-01-04 DIAGNOSIS — M259 Joint disorder, unspecified: Secondary | ICD-10-CM | POA: Insufficient documentation

## 2013-01-04 DIAGNOSIS — M25819 Other specified joint disorders, unspecified shoulder: Secondary | ICD-10-CM | POA: Insufficient documentation

## 2013-01-04 LAB — BASIC METABOLIC PANEL
Chloride: 104 mEq/L (ref 96–112)
GFR calc Af Amer: 88 mL/min — ABNORMAL LOW (ref >90)
Potassium: 4.3 mEq/L (ref 3.5–5.1)

## 2013-01-04 MED ORDER — IOHEXOL 300 MG/ML  SOLN
50.0000 mL | Freq: Once | INTRAMUSCULAR | Status: AC | PRN
  Administered 2013-01-04: 50 mL via INTRAVENOUS

## 2013-01-04 MED ORDER — PREDNISONE 50 MG PO TABS: 50.0000 mg | ORAL_TABLET | Freq: Once | ORAL | Status: DC

## 2013-01-04 MED ORDER — PREDNISONE 50 MG PO TABS
50.0000 mg | ORAL_TABLET | Freq: Once | ORAL | Status: AC
  Administered 2013-01-04: 50 mg via ORAL
  Filled 2013-01-04: qty 1

## 2013-01-04 MED ORDER — IOHEXOL 300 MG/ML  SOLN
80.0000 mL | Freq: Once | INTRAMUSCULAR | Status: AC | PRN
  Administered 2013-01-04: 70 mL via INTRAVENOUS

## 2013-01-09 ENCOUNTER — Ambulatory Visit (INDEPENDENT_AMBULATORY_CARE_PROVIDER_SITE_OTHER): Payer: BC Managed Care – PPO | Admitting: Surgery

## 2013-01-09 ENCOUNTER — Encounter: Payer: Self-pay | Admitting: Surgery

## 2013-01-09 ENCOUNTER — Encounter: Payer: BC Managed Care – PPO | Admitting: Surgery

## 2013-01-09 VITALS — BP 135/97 | HR 76 | Resp 16 | Ht 64.5 in | Wt 205.0 lb

## 2013-01-09 DIAGNOSIS — D179 Benign lipomatous neoplasm, unspecified: Secondary | ICD-10-CM

## 2013-01-09 NOTE — Progress Notes (Signed)
[address]       [place] [address]             [phone]      HPI:  She returns today for review of her CT scans of the chest and neck. She has had no change in symptoms.  Current Outpatient Prescriptions  Medication Sig Dispense Refill  . aspirin 81 MG EC tablet Take 81 mg by mouth daily.        . Calcium Citrate-Vitamin D (CITRACAL PETITES/VITAMIN D PO) Take 1 tablet by mouth daily.      . diphenhydrAMINE (SOMINEX) 25 MG tablet Take 25 mg by mouth at bedtime as needed for sleep (take 2 caps at 12 noon on 01/04/13 prior to CT SCAN).      [name] estradiol-norethindrone (MIMVEY) 1-0.5 MG per tablet Take 1 tablet by mouth daily.      . fexofenadine (ALLEGRA) 180 MG tablet Take 180 mg by mouth daily.        [name] levothyroxine (SYNTHROID, LEVOTHROID) 112 MCG tablet Take 112 mcg by mouth daily.      . metFORMIN (GLUCOPHAGE) 500 MG tablet Take 1,000 mg by mouth daily.       . mometasone (NASONEX) 50 MCG/ACT nasal spray Place 2 sprays into the nose daily.  17 g  5  . Multiple Vitamin (MULTIVITAMIN) capsule Take 1 capsule by mouth daily.        [name] tolterodine (DETROL LA) 2 MG 24 hr capsule Take 2 mg by mouth as needed.      . valACYclovir (VALTREX) 500 MG tablet Take 500 mg by mouth daily.       No current facility-administered medications for this visit.     Physical Exam: BP 135/97  Pulse 76  Resp 16  Ht 5' 4.5" (1.638 m)  Wt 205 lb (92.987 kg)  BMI 34.66 kg/m2  SpO2 99% She looks well. The palpable mass over the left upper back is unchanged. The fullness in the left supraclavicular fossa is unchanged and does not seem any different than on the right side.  Diagnostic Tests:  *RADIOLOGY REPORT*   Clinical Data: Mass above the left clavicle and posterior upper left shoulder.   CT NECK WITH CONTRAST   Technique:  Multidetector CT imaging of the neck was performed with intravenous contrast.   Contrast: 50mL OMNIPAQUE IOHEXOL 300 MG/ML  SOLN   Comparison:  05/27/2011.   Findings: Anterior bulging of the fatty tissue with a convex anterior margin at the location of the palpable mass, marked with a metallic marker.  No capsule identified. At the posterior aspect of the palpable abnormality, marked with a second marker, there is an area of more homogeneous low density with a thin surrounding capsule suggested on the axial and sagittal images, measuring 3.0 x 2.3 x 1.5 cm in maximum dimensions on axial image number 67 and sagittal image number 70.  No enlarged lymph nodes.  Clear lung apices. Mildly progressive degenerative changes at the C5-6 and C6- 7 levels with continued reversal of the normal lordosis at those levels.   IMPRESSION: The palpable abnormality on the left has CT features compatible with a lipoma.     Original Report Authenticated By: Beckie Salts, M.D.        *RADIOLOGY REPORT*   Clinical Data: Palpable abnormality above the left clavicle/posterior shoulder   CT CHEST WITH CONTRAST   Technique:  Multidetector CT imaging of the chest was performed following the standard protocol  during bolus administration of intravenous contrast.   Contrast: 50mL OMNIPAQUE IOHEXOL 300 MG/ML  SOLN   Comparison: Chest radiographs dated 05/23/2011   Findings: Mild linear scarring in the lingula and left lower lobe. Mild dependent atelectasis / subpleural reticulation in the bilateral lower lobes.  No suspicious pulmonary nodules.  No pleural effusion or pneumothorax.   Visualized thyroid is unremarkable.   The heart is normal in size.  No pericardial effusion.   No suspicious supraclavicular, mediastinal, hilar, or axillary lymphadenopathy.   Visualized upper abdomen is unremarkable.   Mild degenerative changes of the visualized thoracolumbar spine.   IMPRESSION: No evidence of acute cardiopulmonary disease.   Dedicated imaging of the neck is reported separately.     Original Report Authenticated By: Charline Bills, M.D.     Impression:  The CT scan the neck shows a 3.0 x 2.3 x 1.5 cm homogeneous low density area with a thin surrounding capsule over the left upper back suggesting a subcutaneous lipoma. There does not appear to be any well-circumscribed, encapsulated fatty tissue in the left supraclavicular fossa to suggest lipoma. I suspect this is the normal fat that we see in the supraclavicular fossa. I think the subcutaneous lipoma on the left upper back can be removed surgically if it is causing her pain.  Plan:  She said that she is probably interested in having the lipoma removed but would like to wait until later in the summer. She will call office when she decides to schedule that.

## 2013-02-13 ENCOUNTER — Ambulatory Visit: Payer: BC Managed Care – PPO | Admitting: Internal Medicine

## 2013-02-19 DIAGNOSIS — Z0279 Encounter for issue of other medical certificate: Secondary | ICD-10-CM

## 2013-03-22 ENCOUNTER — Ambulatory Visit (INDEPENDENT_AMBULATORY_CARE_PROVIDER_SITE_OTHER): Payer: BC Managed Care – PPO | Admitting: Internal Medicine

## 2013-03-22 ENCOUNTER — Encounter: Payer: Self-pay | Admitting: Internal Medicine

## 2013-03-22 VITALS — BP 110/68 | HR 93 | Temp 97.9°F | Ht 65.5 in | Wt 212.0 lb

## 2013-03-22 DIAGNOSIS — J309 Allergic rhinitis, unspecified: Secondary | ICD-10-CM

## 2013-03-22 DIAGNOSIS — R7302 Impaired glucose tolerance (oral): Secondary | ICD-10-CM

## 2013-03-22 DIAGNOSIS — R7309 Other abnormal glucose: Secondary | ICD-10-CM

## 2013-03-22 DIAGNOSIS — J019 Acute sinusitis, unspecified: Secondary | ICD-10-CM

## 2013-03-22 DIAGNOSIS — J45909 Unspecified asthma, uncomplicated: Secondary | ICD-10-CM

## 2013-03-22 MED ORDER — LEVOFLOXACIN 250 MG PO TABS: 250.0000 mg | ORAL_TABLET | Freq: Every day | ORAL | Status: DC

## 2013-03-22 NOTE — Assessment & Plan Note (Signed)
stable overall by history and exam, recent data reviewed with pt, and pt to continue medical treatment as before,  to f/u any worsening symptoms or concerns SpO2 Readings from Last 3 Encounters:  03/22/13 99%  01/09/13 99%  01/02/13 98%

## 2013-03-22 NOTE — Assessment & Plan Note (Signed)
Mild to mod, for re-start nasonex, allegra prn,  to f/u any worsening symptoms or concerns

## 2013-03-22 NOTE — Assessment & Plan Note (Signed)
Mild to mod, for antibx course,  to f/u any worsening symptoms or concerns 

## 2013-03-22 NOTE — Patient Instructions (Signed)
Please take all new medication as prescribed Please continue all other medications as before Please have the pharmacy call with any other refills you may need.  Thank you for enrolling in MyChart. Please follow the instructions below to securely access your online medical record. MyChart allows you to send messages to your doctor, view your test results, renew your prescriptions, schedule appointments, and more.

## 2013-03-22 NOTE — Progress Notes (Signed)
Subjective:    Patient ID: Suzanne Hansen, female    DOB: 01/01/1956, 57 y.o.   MRN: [id number]  HPI   Here with 2-3 days acute onset fever, facial pain, pressure, headache, general weakness and malaise, and greenish d/c, with mild ST and cough, but pt denies chest pain, wheezing, increased sob or doe, orthopnea, PND, increased LE swelling, palpitations, dizziness or syncope.  Pt denies new neurological symptoms such as new headache, or facial or extremity weakness or numbness   Pt denies polydipsia, polyuria.  Does have several wks ongoing nasal allergy symptoms with clearish congestion, itch and sneezing, without fever, pain, ST, cough, swelling or wheezing, and overall mild.  Past Medical History  Diagnosis Date  . Hypoactive thyroid   . Asthma   . Allergy   . HERPES SIMPLEX, UNCOMPLICATED 05/23/2007  . HYPERTHYROIDISM 05/23/2007  . HYPOTHYROIDISM 05/09/2008  . VITAMIN D DEFICIENCY 08/28/2009  . GLUCOSE INTOLERANCE 08/28/2009  . OBESITY 05/23/2007  . ANXIETY 05/23/2007  . SINUSITIS- ACUTE-NOS 11/03/2008  . URI 08/28/2009  . ALLERGIC RHINITIS 05/09/2008  . ASTHMA 05/23/2007  . UTI 04/29/2008  . ENDOMETRIOSIS NOS 05/23/2007  . CONTACT DERMATITIS 06/09/2009  . UNSPECIFIED URTICARIA 06/04/2009  . SHOULDER PAIN, LEFT 05/17/2010  . LATERAL EPICONDYLITIS, RIGHT 03/10/2009  . ARM PAIN, LEFT 10/23/2009  . FACIAL PAIN 06/02/2010  . Wheezing 07/12/2010  . Unspecified Chest Pain 07/25/2008  . FREQUENCY, URINARY 05/17/2010  . INSOMNIA, HX OF 05/23/2007  . LIPOMA 10/22/2010  . SKIN LESION 11/10/2010  . NECK PAIN 11/10/2010  . Impaired glucose tolerance 01/28/2011  . Diabetes mellitus   . Plantar fasciitis     Both feet   Past Surgical History  Procedure Laterality Date  . Endometrial ablation    . Wisdom tooth extraction    . Uterine fibroid surgery    . Laminectomy  1996  . Laparotomy      exploratory  . S/p endometrial ablation  12/06  . Tubal ligation      reports that she quit smoking about 25 years  ago. Her smoking use included Cigarettes. She smoked 0.50 packs per day. She has never used smokeless tobacco. She reports that she does not drink alcohol or use illicit drugs. family history includes COPD in her father; Cancer in her maternal grandmother; Diabetes in her cousin, mother, and sister; Heart disease in her cousin and mother; Hypertension in her cousin and father; and Kidney disease in her sister. Allergies  Allergen Reactions  . Ivp Dye (Iodinated Diagnostic Agents) Shortness Of Breath and Other (See Comments)    Swelling of extremity of the injection site  . Latex Itching    ITCHING AND COLD SORES AROUND MOUTH WHEN LATEX GLOVES USED BY THE DENTIST/HYGENTIST  . Clarithromycin     REACTION: nausea  vomiting  . Metronidazole Nausea And Vomiting   Current Outpatient Prescriptions on File Prior to Visit  Medication Sig Dispense Refill  . aspirin 81 MG EC tablet Take 81 mg by mouth daily.        . Calcium Citrate-Vitamin D (CITRACAL PETITES/VITAMIN D PO) Take 1 tablet by mouth daily.      . diphenhydrAMINE (SOMINEX) 25 MG tablet Take 25 mg by mouth at bedtime as needed for sleep (take 2 caps at 12 noon on 01/04/13 prior to CT SCAN).      [name] estradiol-norethindrone (MIMVEY) 1-0.5 MG per tablet Take 1 tablet by mouth daily.      . fexofenadine (ALLEGRA) 180 MG tablet Take 180 mg  by mouth daily.        [name] levothyroxine (SYNTHROID, LEVOTHROID) 112 MCG tablet Take 112 mcg by mouth daily.      . metFORMIN (GLUCOPHAGE) 500 MG tablet Take 1,000 mg by mouth daily.       . mometasone (NASONEX) 50 MCG/ACT nasal spray Place 2 sprays into the nose daily.  17 g  5  . Multiple Vitamin (MULTIVITAMIN) capsule Take 1 capsule by mouth daily.        [name] tolterodine (DETROL LA) 2 MG 24 hr capsule Take 2 mg by mouth as needed.      . valACYclovir (VALTREX) 500 MG tablet Take 500 mg by mouth daily.       No current facility-administered medications on file prior to visit.   Review of Systems   Constitutional: Negative for unexpected weight change, or unusual diaphoresis  HENT: Negative for tinnitus.   Eyes: Negative for photophobia and visual disturbance.  Respiratory: Negative for choking and stridor.   Gastrointestinal: Negative for vomiting and blood in stool.  Genitourinary: Negative for hematuria and decreased urine volume.  Musculoskeletal: Negative for acute joint swelling Skin: Negative for color change and wound.  Neurological: Negative for tremors and numbness other than noted  Psychiatric/Behavioral: Negative for decreased concentration or  hyperactivity.       Objective:   Physical Exam BP 110/68  Pulse 93  Temp(Src) 97.9 F (36.6 C) (Oral)  Ht 5' 5.5" (1.664 m)  Wt 212 lb (96.163 kg)  BMI 34.73 kg/m2  SpO2 99% VS noted, mild ill Constitutional: Pt appears well-developed and well-nourished.  HENT: Head: NCAT.  Right Ear: External ear normal.  Left Ear: External ear normal.  Bilat tm's with mild erythema.  Max sinus areas mild tender.  Pharynx with mild erythema, no exudate Eyes: Conjunctivae and EOM are normal. Pupils are equal, round, and reactive to light.  Neck: Normal range of motion. Neck supple.  Cardiovascular: Normal rate and regular rhythm.   Pulmonary/Chest: Effort normal and breath sounds normal.  Neurological: Pt is alert. Not confused  Skin: Skin is warm. No erythema.  Psychiatric: Pt behavior is normal. Thought content normal.     Assessment & Plan:

## 2013-03-22 NOTE — Assessment & Plan Note (Signed)
stable overall by history and exam, recent data reviewed with pt, and pt to continue medical treatment as before,  to f/u any worsening symptoms or concerns Lab Results  Component Value Date   HGBA1C 5.3 07/28/2011    

## 2013-03-25 ENCOUNTER — Other Ambulatory Visit: Payer: Self-pay | Admitting: Internal Medicine

## 2013-03-25 MED ORDER — HYDROCOD POLST-CHLORPHEN POLST 10-8 MG/5ML PO LQCR: 5.0000 mL | Freq: Two times a day (BID) | ORAL | Status: DC | PRN

## 2013-04-05 ENCOUNTER — Telehealth: Payer: Self-pay

## 2013-04-05 MED ORDER — FLUCONAZOLE 150 MG PO TABS: ORAL_TABLET | ORAL | Status: DC

## 2013-04-05 NOTE — Telephone Encounter (Signed)
The patient has been on an antibiotic for 10 days and now has a yeast infection.  Please sent in something for yeast to [facility] [place]

## 2013-04-05 NOTE — Telephone Encounter (Signed)
Done erx 

## 2013-04-05 NOTE — Telephone Encounter (Signed)
Patient informed. 

## 2013-08-15 ENCOUNTER — Other Ambulatory Visit: Payer: Self-pay

## 2013-08-15 ENCOUNTER — Other Ambulatory Visit: Payer: Self-pay | Admitting: Internal Medicine

## 2013-09-10 ENCOUNTER — Ambulatory Visit (INDEPENDENT_AMBULATORY_CARE_PROVIDER_SITE_OTHER): Payer: BC Managed Care – PPO | Admitting: Internal Medicine

## 2013-09-10 ENCOUNTER — Encounter: Payer: Self-pay | Admitting: Internal Medicine

## 2013-09-10 VITALS — BP 110/80 | HR 82 | Temp 98.1°F | Wt 213.2 lb

## 2013-09-10 DIAGNOSIS — J45909 Unspecified asthma, uncomplicated: Secondary | ICD-10-CM

## 2013-09-10 DIAGNOSIS — R079 Chest pain, unspecified: Secondary | ICD-10-CM | POA: Insufficient documentation

## 2013-09-10 NOTE — Assessment & Plan Note (Signed)
stable overall by history and exam, recent data reviewed with pt, and pt to continue medical treatment as before,  to f/u any worsening symptoms or concerns SpO2 Readings from Last 3 Encounters:  09/10/13 95%  03/22/13 99%  01/09/13 99%

## 2013-09-10 NOTE — Assessment & Plan Note (Addendum)
ECG reviewed as per emr, atypical, exertional but o/w etiology unclear, for cxr and stress test,  to f/u any worsening symptoms or concerns

## 2013-09-10 NOTE — Assessment & Plan Note (Signed)
stable overall by history and exam, recent data reviewed with pt, and pt to continue medical treatment as before,  to f/u any worsening symptoms or concerns Lab Results  Component Value Date   HGBA1C 5.3 07/28/2011    

## 2013-09-10 NOTE — Progress Notes (Signed)
Pre-visit discussion using our clinic review tool. No additional management support is needed unless otherwise documented below in the visit note.  

## 2013-09-10 NOTE — Progress Notes (Signed)
Subjective:    Patient ID: Suzanne Hansen, female    DOB: 14-Mar-1956, 57 y.o.   MRN: [id number]  HPI  Here with 3-6 mo recurring mid/left CP, dull and worse with exertion occasionally but has not limited her activity, not assoc with radiation, sob, n/v, diaphoresis, palp or dizziness.  No fever, cough, not worse to moving the left shoulder, and not assoc with eating or antacid use.  Mar 2014 Chest CT without mass or CAD noted.   Pt denies fever, wt loss, night sweats, loss of appetite, or other constitutional symptoms.  Worried mostly as her mother died at 51yo of MI. Pt denies other chest pain, increased sob or doe, wheezing, orthopnea, PND, increased LE swelling.  Pt denies polydipsia, polyuria,   Past Medical History  Diagnosis Date  . Hypoactive thyroid   . Asthma   . Allergy   . HERPES SIMPLEX, UNCOMPLICATED 05/23/2007  . HYPERTHYROIDISM 05/23/2007  . HYPOTHYROIDISM 05/09/2008  . VITAMIN D DEFICIENCY 08/28/2009  . GLUCOSE INTOLERANCE 08/28/2009  . OBESITY 05/23/2007  . ANXIETY 05/23/2007  . SINUSITIS- ACUTE-NOS 11/03/2008  . URI 08/28/2009  . ALLERGIC RHINITIS 05/09/2008  . ASTHMA 05/23/2007  . UTI 04/29/2008  . ENDOMETRIOSIS NOS 05/23/2007  . CONTACT DERMATITIS 06/09/2009  . UNSPECIFIED URTICARIA 06/04/2009  . SHOULDER PAIN, LEFT 05/17/2010  . LATERAL EPICONDYLITIS, RIGHT 03/10/2009  . ARM PAIN, LEFT 10/23/2009  . FACIAL PAIN 06/02/2010  . Wheezing 07/12/2010  . Unspecified Chest Pain 07/25/2008  . FREQUENCY, URINARY 05/17/2010  . INSOMNIA, HX OF 05/23/2007  . LIPOMA 10/22/2010  . SKIN LESION 11/10/2010  . NECK PAIN 11/10/2010  . Impaired glucose tolerance 01/28/2011  . Diabetes mellitus   . Plantar fasciitis     Both feet   Past Surgical History  Procedure Laterality Date  . Endometrial ablation    . Wisdom tooth extraction    . Uterine fibroid surgery    . Laminectomy  1996  . Laparotomy      exploratory  . S/p endometrial ablation  12/06  . Tubal ligation      reports that she  quit smoking about 25 years ago. Her smoking use included Cigarettes. She smoked 0.50 packs per day. She has never used smokeless tobacco. She reports that she does not drink alcohol or use illicit drugs. family history includes COPD in her father; Cancer in her maternal grandmother; Diabetes in her cousin, mother, and sister; Heart disease in her cousin and mother; Hypertension in her cousin and father; Kidney disease in her sister. Allergies  Allergen Reactions  . Ivp Dye [Iodinated Diagnostic Agents] Shortness Of Breath and Other (See Comments)    Swelling of extremity of the injection site  . Latex Itching    ITCHING AND COLD SORES AROUND MOUTH WHEN LATEX GLOVES USED BY THE DENTIST/HYGENTIST  . Clarithromycin     REACTION: nausea  vomiting  . Metronidazole Nausea And Vomiting   Current Outpatient Prescriptions on File Prior to Visit  Medication Sig Dispense Refill  . aspirin 81 MG EC tablet Take 81 mg by mouth daily.        . Calcium Citrate-Vitamin D (CITRACAL PETITES/VITAMIN D PO) Take 1 tablet by mouth daily.      . chlorpheniramine-HYDROcodone (TUSSIONEX PENNKINETIC ER) 10-8 MG/5ML LQCR Take 5 mLs by mouth every 12 (twelve) hours as needed.  140 mL  1  . diphenhydrAMINE (SOMINEX) 25 MG tablet Take 25 mg by mouth at bedtime as needed for sleep (take 2 caps at 12  noon on 01/04/13 prior to CT SCAN).      [name] estradiol-norethindrone (MIMVEY) 1-0.5 MG per tablet Take 1 tablet by mouth daily.      . fexofenadine (ALLEGRA) 180 MG tablet Take 180 mg by mouth daily.        . fluconazole (DIFLUCAN) 150 MG tablet 1 tab every 3 days as needed  2 tablet  1  . levofloxacin (LEVAQUIN) 250 MG tablet Take 1 tablet (250 mg total) by mouth daily.  10 tablet  0  . levothyroxine (SYNTHROID, LEVOTHROID) 112 MCG tablet Take 112 mcg by mouth daily.      . metFORMIN (GLUCOPHAGE) 500 MG tablet Take 1,000 mg by mouth daily.       . mometasone (NASONEX) 50 MCG/ACT nasal spray Place 2 sprays into the nose daily.  17  g  5  . Multiple Vitamin (MULTIVITAMIN) capsule Take 1 capsule by mouth daily.        . SYMBICORT 160-4.5 MCG/ACT inhaler INHALE TWO PUFFS INTO LUNGS TWICE DAILY  10.2 Inhaler  11  . tolterodine (DETROL LA) 2 MG 24 hr capsule Take 2 mg by mouth as needed.      . valACYclovir (VALTREX) 500 MG tablet Take 500 mg by mouth daily.       No current facility-administered medications on file prior to visit.   Review of Systems  Constitutional: Negative for unexpected weight change, or unusual diaphoresis  HENT: Negative for tinnitus.   Eyes: Negative for photophobia and visual disturbance.  Respiratory: Negative for choking and stridor.   Gastrointestinal: Negative for vomiting and blood in stool.  Genitourinary: Negative for hematuria and decreased urine volume.  Musculoskeletal: Negative for acute joint swelling Skin: Negative for color change and wound.  Neurological: Negative for tremors and numbness other than noted  Psychiatric/Behavioral: Negative for decreased concentration or  hyperactivity.       Objective:   Physical Exam BP 110/80  Pulse 82  Temp(Src) 98.1 F (36.7 C) (Oral)  Wt 213 lb 4 oz (96.73 kg)  SpO2 95% VS noted,  Constitutional: Pt appears well-developed and well-nourished.  HENT: Head: NCAT.  Right Ear: External ear normal.  Left Ear: External ear normal.  Eyes: Conjunctivae and EOM are normal. Pupils are equal, round, and reactive to light.  Neck: Normal range of motion. Neck supple.  Cardiovascular: Normal rate and regular rhythm.   Pulmonary/Chest: Effort normal and breath sounds normal.  Neurological: Pt is alert. Not confused  Skin: no rash Psychiatric: Pt behavior is normal. Thought content normal.      Assessment & Plan:

## 2013-09-10 NOTE — Patient Instructions (Signed)
Please continue all other medications as before, and refills have been done if requested. Please have the pharmacy call with any other refills you may need.  Please go to the XRAY Department in the Basement (go straight as you get off the elevator) for the x-ray testing  You will be contacted regarding the referral for: stress test

## 2013-09-11 ENCOUNTER — Ambulatory Visit (INDEPENDENT_AMBULATORY_CARE_PROVIDER_SITE_OTHER)
Admission: RE | Admit: 2013-09-11 | Discharge: 2013-09-11 | Disposition: A | Discharge status: Home / Self Care | Payer: BC Managed Care – PPO | Source: Ambulatory Visit | Attending: Internal Medicine | Admitting: Internal Medicine

## 2013-09-11 DIAGNOSIS — R079 Chest pain, unspecified: Secondary | ICD-10-CM

## 2013-09-18 ENCOUNTER — Telehealth: Payer: Self-pay | Admitting: *Deleted

## 2013-09-18 MED ORDER — FLUCONAZOLE 150 MG PO TABS: ORAL_TABLET | ORAL | Status: DC

## 2013-09-18 MED ORDER — AZITHROMYCIN 250 MG PO TABS: ORAL_TABLET | ORAL | Status: DC

## 2013-09-18 NOTE — Telephone Encounter (Signed)
Patient informed. 

## 2013-09-18 NOTE — Telephone Encounter (Signed)
Pt called states she was seen on 12.2.14, states she is not feeling any better.  She is requesting an Rx for URI.  Please advise

## 2013-09-18 NOTE — Telephone Encounter (Signed)
Done per emr 

## 2013-09-23 ENCOUNTER — Encounter: Payer: Self-pay | Admitting: Cardiology

## 2013-09-28 ENCOUNTER — Other Ambulatory Visit: Payer: Self-pay | Admitting: Internal Medicine

## 2013-10-07 ENCOUNTER — Ambulatory Visit (HOSPITAL_COMMUNITY): Payer: BC Managed Care – PPO | Attending: Internal Medicine | Admitting: Radiology

## 2013-10-07 ENCOUNTER — Encounter: Payer: Self-pay | Admitting: Cardiology

## 2013-10-07 VITALS — BP 141/97 | HR 65 | Ht 65.0 in | Wt 212.0 lb

## 2013-10-07 DIAGNOSIS — R0602 Shortness of breath: Secondary | ICD-10-CM

## 2013-10-07 DIAGNOSIS — R079 Chest pain, unspecified: Secondary | ICD-10-CM

## 2013-10-07 MED ORDER — TECHNETIUM TC 99M SESTAMIBI GENERIC - CARDIOLITE
33.0000 | Freq: Once | INTRAVENOUS | Status: AC | PRN
  Administered 2013-10-07: 33 via INTRAVENOUS

## 2013-10-07 MED ORDER — TECHNETIUM TC 99M SESTAMIBI GENERIC - CARDIOLITE
11.0000 | Freq: Once | INTRAVENOUS | Status: AC | PRN
  Administered 2013-10-07: 11 via INTRAVENOUS

## 2013-10-07 NOTE — Progress Notes (Signed)
[facility] 3 NUCLEAR MED [address] [place], [place] [address] [phone]    Cardiology Nuclear Med Study  Suzanne Hansen is a 57 y.o. female     MRN : [id number]     DOB: April 27, 1956  Procedure Date: 10/07/2013  Nuclear Med Background Indication for Stress Test:  Evaluation for Ischemia History:  No prior hx CAD; MPI 2007-Nml, EF 62%; Asthma Cardiac Risk Factors: Family History - CAD, History of Smoking and NIDDM  Symptoms:  Chest Pain (last date of chest discomfort 2 days ago),Dizziness, DOE, SOB   Nuclear Pre-Procedure Caffeine/Decaff Intake:  8:00pm NPO After: 11:00pm   Lungs:  clear O2 Sat: 94% on room air. IV 0.9% NS with Angio Cath:  22g  IV Site: R Wrist  IV Started by:  Doyne Keel, CNMT  Chest Size (in):  38 Cup Size: DDD  Height: 5\' 5"  (1.651 m)  Weight:  212 lb (96.163 kg)  BMI:  Body mass index is 35.28 kg/(m^2). Tech Comments:  Held am meds; Used inhalers    Nuclear Med Study 1 or 2 day study: 1 day  Stress Test Type:  Stress  Reading MD: Tobias Alexander, MD  Order Authorizing Provider:  Oliver Barre, MD  Resting Radionuclide: Technetium 27m Sestamibi  Resting Radionuclide Dose: 10.5 mCi   Stress Radionuclide:  Technetium 44m Sestamibi  Stress Radionuclide Dose: 33.0 mCi           Stress Protocol Rest HR: 65 Stress HR: 150  Rest BP: 141/97 Stress BP: 164/94  Exercise Time (min): 5:15 METS: 7.0   Predicted Max HR: 164 bpm % Max HR: 91.46 bpm Rate Pressure Product: 21308   Dose of Adenosine (mg):  n/a Dose of Lexiscan: n/a mg  Dose of Atropine (mg): n/a Dose of Dobutamine: n/a mcg/kg/min (at max HR)  Stress Test Technologist: Irean Hong, RN  Nuclear Technologist:  Domenic Polite, CNMT     Rest Procedure:  Myocardial perfusion imaging was performed at rest 45 minutes following the intravenous administration of Technetium 73m Sestamibi. Rest ECG: NSR - Normal EKG  Stress Procedure:  The patient exercised on the treadmill  utilizing the Bruce Protocol for 5:15 minutes, RPE=17. The patient stopped due to Chest Pressure, and DOE. The patient had a continuous run of bigeminy PVC's at 1:48 minutes into exercise that continued to recovery.The patient did not feel the skip beats.  Technetium 73m Sestamibi was injected at peak exercise and myocardial perfusion imaging was performed after a brief delay. Stress ECG: No significant change from baseline ECG. Very frequent PVCs, bigeminy.  QPS Raw Data Images:  Normal; no motion artifact; normal heart/lung ratio. Stress Images:  Normal homogeneous uptake in all areas of the myocardium. Rest Images:  Normal homogeneous uptake in all areas of the myocardium. Subtraction (SDS):  No evidence of ischemia. Transient Ischemic Dilatation (Normal <1.22):  1.09 Lung/Heart Ratio (Normal <0.45):  0.37  Quantitative Gated Spect Images QGS EDV:  97 ml QGS ESV:  50 ml  Impression Exercise Capacity:  Good exercise capacity. BP Response:  Normal blood pressure response. Clinical Symptoms:  Mild chest pain/dyspnea. ECG Impression:  No significant ST segment change suggestive of ischemia. Comparison with Prior Nuclear Study: No significant change from previous study  Overall Impression:  Normal stress nuclear study.  LV Ejection Fraction: 48%.  LV Wall Motion:  Low normal LVEF, no wall motiojn abnormalities.     Tobias Alexander, Rexene Edison 10/07/2013

## 2013-10-31 ENCOUNTER — Encounter: Payer: Self-pay | Admitting: Internal Medicine

## 2013-10-31 ENCOUNTER — Ambulatory Visit (INDEPENDENT_AMBULATORY_CARE_PROVIDER_SITE_OTHER): Payer: BC Managed Care – PPO | Admitting: Internal Medicine

## 2013-10-31 VITALS — BP 110/80 | HR 92 | Temp 99.1°F | Ht 65.5 in | Wt 216.1 lb

## 2013-10-31 DIAGNOSIS — J45909 Unspecified asthma, uncomplicated: Secondary | ICD-10-CM

## 2013-10-31 DIAGNOSIS — F411 Generalized anxiety disorder: Secondary | ICD-10-CM

## 2013-10-31 DIAGNOSIS — J309 Allergic rhinitis, unspecified: Secondary | ICD-10-CM

## 2013-10-31 DIAGNOSIS — J019 Acute sinusitis, unspecified: Secondary | ICD-10-CM

## 2013-10-31 MED ORDER — AZITHROMYCIN 250 MG PO TABS: ORAL_TABLET | ORAL | Status: DC

## 2013-10-31 MED ORDER — HYDROCOD POLST-CHLORPHEN POLST 10-8 MG/5ML PO LQCR: 5.0000 mL | Freq: Two times a day (BID) | ORAL | Status: DC | PRN

## 2013-10-31 MED ORDER — MOMETASONE FUROATE 50 MCG/ACT NA SUSP: 2.0000 | Freq: Every day | NASAL | Status: DC

## 2013-10-31 NOTE — Assessment & Plan Note (Signed)
stable overall by history and exam, , and pt to continue medical treatment as before,  to f/u any worsening symptoms or concerns  

## 2013-10-31 NOTE — Progress Notes (Signed)
Subjective:    Patient ID: Nitisha Civello, female    DOB: 03-Oct-1956, 58 y.o.   MRN: [id number]  HPI   Here with 2-3 days acute onset fever, facial pain, pressure, headache, general weakness and malaise, and greenish d/c, with mild ST and cough, but pt denies chest pain, wheezing, increased sob or doe, orthopnea, PND, increased LE swelling, palpitations, dizziness or syncope, except for diffuse ant chest soreness only with cough and deep breath, non positional, nonexertional  Pt denies polydipsia, polyuria, Pt denies new neurological symptoms such as new headache, or facial or extremity weakness or numbness Denies worsening depressive symptoms, suicidal ideation, or panic; has ongoing anxiety, not increased recently.   Past Medical History  Diagnosis Date  . Hypoactive thyroid   . Asthma   . Allergy   . HERPES SIMPLEX, UNCOMPLICATED 2/42/6834  . HYPERTHYROIDISM 05/23/2007  . HYPOTHYROIDISM 05/09/2008  . VITAMIN D DEFICIENCY 08/28/2009  . GLUCOSE INTOLERANCE 08/28/2009  . OBESITY 05/23/2007  . ANXIETY 05/23/2007  . SINUSITIS- ACUTE-NOS 11/03/2008  . URI 08/28/2009  . ALLERGIC RHINITIS 05/09/2008  . ASTHMA 05/23/2007  . UTI 04/29/2008  . ENDOMETRIOSIS NOS 05/23/2007  . CONTACT DERMATITIS 06/09/2009  . UNSPECIFIED URTICARIA 06/04/2009  . SHOULDER PAIN, LEFT 05/17/2010  . LATERAL EPICONDYLITIS, RIGHT 03/10/2009  . ARM PAIN, LEFT 10/23/2009  . FACIAL PAIN 06/02/2010  . Wheezing 07/12/2010  . Unspecified Chest Pain 07/25/2008  . FREQUENCY, URINARY 05/17/2010  . INSOMNIA, HX OF 05/23/2007  . LIPOMA 10/22/2010  . SKIN LESION 11/10/2010  . NECK PAIN 11/10/2010  . Impaired glucose tolerance 01/28/2011  . Diabetes mellitus   . Plantar fasciitis     Both feet   Past Surgical History  Procedure Laterality Date  . Endometrial ablation    . Wisdom tooth extraction    . Uterine fibroid surgery    . Laminectomy  1996  . Laparotomy      exploratory  . S/p endometrial ablation  12/06  . Tubal ligation      reports that she quit smoking about 26 years ago. Her smoking use included Cigarettes. She smoked 0.50 packs per day. She has never used smokeless tobacco. She reports that she does not drink alcohol or use illicit drugs. family history includes COPD in her father; Cancer in her maternal grandmother; Diabetes in her cousin, mother, and sister; Heart disease in her cousin and mother; Hypertension in her cousin and father; Kidney disease in her sister. Allergies  Allergen Reactions  . Ivp Dye [Iodinated Diagnostic Agents] Shortness Of Breath and Other (See Comments)    Swelling of extremity of the injection site  . Latex Itching    ITCHING AND COLD SORES AROUND MOUTH WHEN LATEX GLOVES USED BY THE DENTIST/HYGENTIST  . Clarithromycin     REACTION: nausea  vomiting  . Metronidazole Nausea And Vomiting   Current Outpatient Prescriptions on File Prior to Visit  Medication Sig Dispense Refill  . aspirin 81 MG EC tablet Take 81 mg by mouth daily.        . Calcium Citrate-Vitamin D (CITRACAL PETITES/VITAMIN D PO) Take 1 tablet by mouth daily.      [name] estradiol-norethindrone (MIMVEY) 1-0.5 MG per tablet Take 1 tablet by mouth daily.      . fexofenadine (ALLEGRA) 180 MG tablet Take 180 mg by mouth daily.        . fluconazole (DIFLUCAN) 150 MG tablet 1 tab every 3 days as needed  2 tablet  1  . levothyroxine (SYNTHROID, LEVOTHROID)  112 MCG tablet Take 112 mcg by mouth daily.      . metFORMIN (GLUCOPHAGE) 500 MG tablet Take 1,000 mg by mouth daily.       . Multiple Vitamin (MULTIVITAMIN) capsule Take 1 capsule by mouth daily.        [name] PROAIR HFA 108 (90 BASE) MCG/ACT inhaler INHALE TWO PUFFS INTO LUNGS 4 TIMES DAILY AS NEEDED  9 each  0  . SYMBICORT 160-4.5 MCG/ACT inhaler INHALE TWO PUFFS INTO LUNGS TWICE DAILY  10.2 Inhaler  11  . tolterodine (DETROL LA) 2 MG 24 hr capsule Take 2 mg by mouth as needed.      . valACYclovir (VALTREX) 500 MG tablet Take 500 mg by mouth daily.       No current  facility-administered medications on file prior to visit.   Review of Systems  Constitutional: Negative for unexpected weight change, or unusual diaphoresis  HENT: Negative for tinnitus.   Eyes: Negative for photophobia and visual disturbance.  Respiratory: Negative for choking and stridor.   Gastrointestinal: Negative for vomiting and blood in stool.  Genitourinary: Negative for hematuria and decreased urine volume.  Musculoskeletal: Negative for acute joint swelling Skin: Negative for color change and wound.  Neurological: Negative for tremors and numbness other than noted  Psychiatric/Behavioral: Negative for decreased concentration or  hyperactivity.       Objective:   Physical Exam BP 110/80  Pulse 92  Temp(Src) 99.1 F (37.3 C) (Oral)  Ht 5' 5.5" (1.664 m)  Wt 216 lb 2 oz (98.034 kg)  BMI 35.41 kg/m2  SpO2 94% VS noted, mild ill Constitutional: Pt appears well-developed and well-nourished.  HENT: Head: NCAT.  Right Ear: External ear normal.  Left Ear: External ear normal.  Bilat tm's with mild erythema.  Max sinus areas mild tender.  Pharynx with mild erythema, no exudate Eyes: Conjunctivae and EOM are normal. Pupils are equal, round, and reactive to light.  Neck: Normal range of motion. Neck supple.  Cardiovascular: Normal rate and regular rhythm.   Pulmonary/Chest: Effort normal and breath sounds normal.  Neurological: Pt is alert. Not confused  Skin: Skin is warm. No erythema.  Psychiatric: Pt behavior is normal. Thought content normal mild nervous only    Assessment & Plan:

## 2013-10-31 NOTE — Assessment & Plan Note (Signed)
stable overall by history and exam, recent data reviewed with pt, and pt to continue medical treatment as before,  to f/u any worsening symptoms or concerns SpO2 Readings from Last 3 Encounters:  10/31/13 94%  10/07/13 94%  09/10/13 95%

## 2013-10-31 NOTE — Assessment & Plan Note (Signed)
Mild to mod, for antibx course,  to f/u any worsening symptoms or concerns 

## 2013-10-31 NOTE — Progress Notes (Signed)
Pre-visit discussion using our clinic review tool. No additional management support is needed unless otherwise documented below in the visit note.  

## 2013-10-31 NOTE — Patient Instructions (Signed)
Please take all new medication as prescribed - the antibiotic, and cough medicine Please have the pharmacy call with any other refills you may need.  Please remember to sign up for My Chart if you have not done so, as this will be important to you in the future with finding out test results, communicating by private email, and scheduling acute appointments online when needed.

## 2013-11-13 ENCOUNTER — Telehealth: Payer: Self-pay | Admitting: *Deleted

## 2013-11-13 ENCOUNTER — Other Ambulatory Visit: Payer: Self-pay | Admitting: Internal Medicine

## 2013-11-13 NOTE — Telephone Encounter (Signed)
Patient phoned requesting assistance from [place] in getting her brother, Suzanne Hansen, seen as a new patient with Dr. Marshall Cork.  States when he called to make an appt, wasn't able to.  States that her brother does not have a PCP and keeps "bouncing back & forth the doc in a box" and needs a good PCP.  States he has similar health issues as she does and has spoken very highly of [facility] to her brother.  REALLY needs Robin's help in "making this happen".  CB# [phone]

## 2013-11-13 NOTE — Telephone Encounter (Signed)
Unfortunately I would need to decline at this time due to not being able to take more new patients at this time

## 2013-11-14 NOTE — Telephone Encounter (Signed)
Called informed of MD's denial to accept as a new patient at this time.

## 2013-12-09 LAB — HM MAMMOGRAPHY

## 2014-04-14 ENCOUNTER — Telehealth: Payer: Self-pay | Admitting: *Deleted

## 2014-04-14 DIAGNOSIS — E1165 Type 2 diabetes mellitus with hyperglycemia: Principal | ICD-10-CM

## 2014-04-14 DIAGNOSIS — IMO0001 Reserved for inherently not codable concepts without codable children: Secondary | ICD-10-CM

## 2014-04-14 NOTE — Telephone Encounter (Signed)
Spoke with patient.  She goes to Endo - Dr Chalmers Cater.  She will schedule her yearly physical. Lipid panel ordered. Diabetic bundle.

## 2014-04-16 ENCOUNTER — Telehealth: Payer: Self-pay

## 2014-04-16 DIAGNOSIS — Z0000 Encounter for general adult medical examination without abnormal findings: Secondary | ICD-10-CM

## 2014-04-16 NOTE — Telephone Encounter (Signed)
cpx labs entered  

## 2014-04-19 ENCOUNTER — Encounter: Payer: Self-pay | Admitting: Family Medicine

## 2014-04-19 ENCOUNTER — Ambulatory Visit (INDEPENDENT_AMBULATORY_CARE_PROVIDER_SITE_OTHER): Payer: BC Managed Care – PPO | Admitting: Family Medicine

## 2014-04-19 VITALS — BP 130/82 | HR 77 | Temp 98.8°F | Wt 224.8 lb

## 2014-04-19 DIAGNOSIS — M542 Cervicalgia: Secondary | ICD-10-CM

## 2014-04-19 MED ORDER — CYCLOBENZAPRINE HCL 10 MG PO TABS: 5.0000 mg | ORAL_TABLET | Freq: Three times a day (TID) | ORAL | Status: DC | PRN

## 2014-04-19 NOTE — Assessment & Plan Note (Signed)
With trapezius ttp, would use flexeril with sedation caution, gently stretch and f/u prn.  D/w pt.  She agrees.

## 2014-04-19 NOTE — Progress Notes (Signed)
Pre visit review using our clinic review tool, if applicable. No additional management support is needed unless otherwise documented below in the visit note.  H/o lipomas on the L side of the neck.  HA this past week.  Has some L sided neck pain and L sided HA.  No FCNAVD.  Some L ear pain.  She doesn't feel ill now, but didn't feel well earlier in the week.  Excedrin helped some, temporarily.  Massaging her neck helped some.  No ST.  Some occ L arm pain but no weakness. No falls, no trauma, etc.    Meds, vitals, and allergies reviewed.   ROS: See HPI.  Otherwise, noncontributory.  nad ncat Lipoma noted on L side of neck x2 Normal ROM neck, no LA.   rrr ctab Tm wnl Nasal and OP exam wnl L>R trap TTP, no midline neck pain.  Normal ROM and S/S BUE.

## 2014-04-19 NOTE — Patient Instructions (Signed)
Use the flexeril (sedation caution) and gently stretch.  Take care.  Glad to see you.

## 2014-04-22 ENCOUNTER — Ambulatory Visit: Payer: BC Managed Care – PPO | Admitting: Internal Medicine

## 2014-06-10 ENCOUNTER — Ambulatory Visit (INDEPENDENT_AMBULATORY_CARE_PROVIDER_SITE_OTHER): Payer: BC Managed Care – PPO | Admitting: Internal Medicine

## 2014-06-10 ENCOUNTER — Encounter: Payer: Self-pay | Admitting: Internal Medicine

## 2014-06-10 VITALS — BP 114/74 | HR 87 | Temp 98.6°F | Ht 65.0 in | Wt 222.1 lb

## 2014-06-10 DIAGNOSIS — B029 Zoster without complications: Secondary | ICD-10-CM

## 2014-06-10 MED ORDER — VALACYCLOVIR HCL 1 G PO TABS: 1000.0000 mg | ORAL_TABLET | Freq: Three times a day (TID) | ORAL | Status: DC

## 2014-06-10 NOTE — Patient Instructions (Signed)
Please take all new medication as prescribed  - the higher dose valtrex for 1 wk  Please continue all other medications as before, except you can HOLD off on the lower dose valtrex for the next week  Please avoid young children for the next few days, who might not have been vaccinated or had the chicken pox  Please have the pharmacy call with any other refills you may need.  Please keep your appointments with your specialists as you may have planned

## 2014-06-10 NOTE — Progress Notes (Signed)
Pre visit review using our clinic review tool, if applicable. No additional management support is needed unless otherwise documented below in the visit note. 

## 2014-06-10 NOTE — Progress Notes (Signed)
Subjective:    Patient ID: Suzanne Hansen, female    DOB: 09/24/1956, 58 y.o.   MRN: [id number]  HPI  Here to f/u; overall doing ok,  Pt denies chest pain, increased sob or doe, wheezing, orthopnea, PND, increased LE swelling, palpitations, dizziness or syncope.  Pt denies polydipsia, polyuria, or low sugar symptoms such as weakness or confusion improved with po intake.  Pt denies new neurological symptoms such as new headache, or facial or extremity weakness or numbness.   Pt states overall good compliance with meds, also with new itchy/mild pain rash to post right shoulder for 3 days, without fever, trauma or prior hx Past Medical History  Diagnosis Date  . Hypoactive thyroid   . Asthma   . Allergy   . HERPES SIMPLEX, UNCOMPLICATED 03/06/7823  . HYPERTHYROIDISM 05/23/2007  . HYPOTHYROIDISM 05/09/2008  . VITAMIN D DEFICIENCY 08/28/2009  . GLUCOSE INTOLERANCE 08/28/2009  . OBESITY 05/23/2007  . ANXIETY 05/23/2007  . SINUSITIS- ACUTE-NOS 11/03/2008  . URI 08/28/2009  . ALLERGIC RHINITIS 05/09/2008  . ASTHMA 05/23/2007  . UTI 04/29/2008  . ENDOMETRIOSIS NOS 05/23/2007  . CONTACT DERMATITIS 06/09/2009  . UNSPECIFIED URTICARIA 06/04/2009  . SHOULDER PAIN, LEFT 05/17/2010  . LATERAL EPICONDYLITIS, RIGHT 03/10/2009  . ARM PAIN, LEFT 10/23/2009  . FACIAL PAIN 06/02/2010  . Wheezing 07/12/2010  . Unspecified Chest Pain 07/25/2008  . FREQUENCY, URINARY 05/17/2010  . INSOMNIA, HX OF 05/23/2007  . LIPOMA 10/22/2010  . SKIN LESION 11/10/2010  . NECK PAIN 11/10/2010  . Impaired glucose tolerance 01/28/2011  . Diabetes mellitus   . Plantar fasciitis     Both feet   Past Surgical History  Procedure Laterality Date  . Endometrial ablation    . Wisdom tooth extraction    . Uterine fibroid surgery    . Laminectomy  1996  . Laparotomy      exploratory  . S/p endometrial ablation  12/06  . Tubal ligation      reports that she quit smoking about 26 years ago. Her smoking use included Cigarettes. She smoked 0.50  packs per day. She has never used smokeless tobacco. She reports that she does not drink alcohol or use illicit drugs. family history includes COPD in her father; Cancer in her maternal grandmother; Diabetes in her cousin, mother, and sister; Heart disease in her cousin and mother; Hypertension in her cousin and father; Kidney disease in her sister. Allergies  Allergen Reactions  . Ivp Dye [Iodinated Diagnostic Agents] Shortness Of Breath and Other (See Comments)    Swelling of extremity of the injection site  . Latex Itching    ITCHING AND COLD SORES AROUND MOUTH WHEN LATEX GLOVES USED BY THE DENTIST/HYGENTIST  . Clarithromycin     REACTION: nausea  vomiting  . Metronidazole Nausea And Vomiting   Current Outpatient Prescriptions on File Prior to Visit  Medication Sig Dispense Refill  . aspirin 81 MG EC tablet Take 81 mg by mouth daily.        . Calcium Citrate-Vitamin D (CITRACAL PETITES/VITAMIN D PO) Take 1 tablet by mouth daily.      . cyclobenzaprine (FLEXERIL) 10 MG tablet Take 0.5-1 tablets (5-10 mg total) by mouth 3 (three) times daily as needed for muscle spasms (sedation caution).  30 tablet  0  . estradiol-norethindrone (MIMVEY) 1-0.5 MG per tablet Take 1 tablet by mouth daily.      . fexofenadine (ALLEGRA) 180 MG tablet Take 180 mg by mouth daily.        [name]  fluconazole (DIFLUCAN) 150 MG tablet 1 tab every 3 days as needed  2 tablet  1  . levothyroxine (SYNTHROID, LEVOTHROID) 112 MCG tablet Take 112 mcg by mouth daily.      . metFORMIN (GLUCOPHAGE) 500 MG tablet Take 1,000 mg by mouth daily.       . mometasone (NASONEX) 50 MCG/ACT nasal spray Place 2 sprays into the nose daily.  17 g  11  . Multiple Vitamin (MULTIVITAMIN) capsule Take 1 capsule by mouth daily.        [name] PROAIR HFA 108 (90 BASE) MCG/ACT inhaler INHALE TWO PUFFS INTO LUNGS 4 TIMES DAILY AS NEEDED  9 each  11  . SYMBICORT 160-4.5 MCG/ACT inhaler INHALE TWO PUFFS INTO LUNGS TWICE DAILY  10.2 Inhaler  11  . tolterodine  (DETROL LA) 2 MG 24 hr capsule Take 2 mg by mouth as needed.      . valACYclovir (VALTREX) 500 MG tablet Take 500 mg by mouth daily.       No current facility-administered medications on file prior to visit.   Review of Systems All otherwise neg per pt     Objective:   Physical Exam BP 114/74  Pulse 87  Temp(Src) 98.6 F (37 C) (Oral)  Ht 5\' 5"  (1.651 m)  Wt 222 lb 2 oz (100.755 kg)  BMI 36.96 kg/m2  SpO2 98% VS noted,  Constitutional: Pt appears well-developed, well-nourished.  HENT: Head: NCAT.  Right Ear: External ear normal.  Left Ear: External ear normal.  Eyes: . Pupils are equal, round, and reactive to light. Conjunctivae and EOM are normal Neck: Normal range of motion. Neck supple.  Cardiovascular: Normal rate and regular rhythm.   Pulmonary/Chest: Effort normal and breath sounds normal.  Neurological: Pt is alert. Not confused , motor grossly intact Skin: Skin is warm. 1.5 cm area right post shoulder grouped vesicles on erythem base Psychiatric: Pt behavior is normal. No agitation.     Assessment & Plan:

## 2014-06-16 NOTE — Assessment & Plan Note (Signed)
Mild to mod, for valtrex course,  to f/u any worsening symptoms or concerns

## 2014-07-01 ENCOUNTER — Encounter: Payer: Self-pay | Admitting: Internal Medicine

## 2014-07-01 ENCOUNTER — Ambulatory Visit (INDEPENDENT_AMBULATORY_CARE_PROVIDER_SITE_OTHER): Payer: BC Managed Care – PPO | Admitting: Internal Medicine

## 2014-07-01 VITALS — BP 112/72 | HR 81 | Temp 99.1°F | Wt 222.2 lb

## 2014-07-01 DIAGNOSIS — Z23 Encounter for immunization: Secondary | ICD-10-CM

## 2014-07-01 DIAGNOSIS — J069 Acute upper respiratory infection, unspecified: Secondary | ICD-10-CM

## 2014-07-01 DIAGNOSIS — IMO0001 Reserved for inherently not codable concepts without codable children: Secondary | ICD-10-CM

## 2014-07-01 DIAGNOSIS — J45901 Unspecified asthma with (acute) exacerbation: Secondary | ICD-10-CM | POA: Insufficient documentation

## 2014-07-01 DIAGNOSIS — E1165 Type 2 diabetes mellitus with hyperglycemia: Secondary | ICD-10-CM

## 2014-07-01 MED ORDER — HYDROCOD POLST-CHLORPHEN POLST 10-8 MG/5ML PO LQCR: 5.0000 mL | Freq: Two times a day (BID) | ORAL | Status: DC | PRN

## 2014-07-01 MED ORDER — LEVOFLOXACIN 250 MG PO TABS: 250.0000 mg | ORAL_TABLET | Freq: Every day | ORAL | Status: DC

## 2014-07-01 MED ORDER — PREDNISONE 10 MG PO TABS: ORAL_TABLET | ORAL | Status: DC

## 2014-07-01 MED ORDER — METHYLPREDNISOLONE ACETATE 80 MG/ML IJ SUSP
80.0000 mg | Freq: Once | INTRAMUSCULAR | Status: AC
  Administered 2014-07-01: 80 mg via INTRAMUSCULAR

## 2014-07-01 NOTE — Progress Notes (Signed)
Subjective:    Patient ID: Suzanne Hansen, female    DOB: 1956-09-26, 58 y.o.   MRN: [id number]  HPI  Here with acute onset mild to mod 2-3 days ST, HA, general weakness and malaise, with prod cough greenish sputum, but Pt denies chest pain, increased sob or doe, wheezing, orthopnea, PND, increased LE swelling, palpitations, dizziness or syncope, except for onset wheezing and sob, woke her up last night, using inhaler > every 6 hrs.  Pt denies polydipsia, polyuria,   Pt states overall good compliance with meds,  Past Medical History  Diagnosis Date  . Hypoactive thyroid   . Asthma   . Allergy   . HERPES SIMPLEX, UNCOMPLICATED 7/82/9562  . HYPERTHYROIDISM 05/23/2007  . HYPOTHYROIDISM 05/09/2008  . VITAMIN D DEFICIENCY 08/28/2009  . GLUCOSE INTOLERANCE 08/28/2009  . OBESITY 05/23/2007  . ANXIETY 05/23/2007  . SINUSITIS- ACUTE-NOS 11/03/2008  . URI 08/28/2009  . ALLERGIC RHINITIS 05/09/2008  . ASTHMA 05/23/2007  . UTI 04/29/2008  . ENDOMETRIOSIS NOS 05/23/2007  . CONTACT DERMATITIS 06/09/2009  . UNSPECIFIED URTICARIA 06/04/2009  . SHOULDER PAIN, LEFT 05/17/2010  . LATERAL EPICONDYLITIS, RIGHT 03/10/2009  . ARM PAIN, LEFT 10/23/2009  . FACIAL PAIN 06/02/2010  . Wheezing 07/12/2010  . Unspecified Chest Pain 07/25/2008  . FREQUENCY, URINARY 05/17/2010  . INSOMNIA, HX OF 05/23/2007  . LIPOMA 10/22/2010  . SKIN LESION 11/10/2010  . NECK PAIN 11/10/2010  . Impaired glucose tolerance 01/28/2011  . Diabetes mellitus   . Plantar fasciitis     Both feet   Past Surgical History  Procedure Laterality Date  . Endometrial ablation    . Wisdom tooth extraction    . Uterine fibroid surgery    . Laminectomy  1996  . Laparotomy      exploratory  . S/p endometrial ablation  12/06  . Tubal ligation      reports that she quit smoking about 26 years ago. Her smoking use included Cigarettes. She smoked 0.50 packs per day. She has never used smokeless tobacco. She reports that she does not drink alcohol or use  illicit drugs. family history includes COPD in her father; Cancer in her maternal grandmother; Diabetes in her cousin, mother, and sister; Heart disease in her cousin and mother; Hypertension in her cousin and father; Kidney disease in her sister. Allergies  Allergen Reactions  . Ivp Dye [Iodinated Diagnostic Agents] Shortness Of Breath and Other (See Comments)    Swelling of extremity of the injection site  . Latex Itching    ITCHING AND COLD SORES AROUND MOUTH WHEN LATEX GLOVES USED BY THE DENTIST/HYGENTIST  . Clarithromycin     REACTION: nausea  vomiting  . Metronidazole Nausea And Vomiting   Current Outpatient Prescriptions on File Prior to Visit  Medication Sig Dispense Refill  . aspirin 81 MG EC tablet Take 81 mg by mouth daily.        . Calcium Citrate-Vitamin D (CITRACAL PETITES/VITAMIN D PO) Take 1 tablet by mouth daily.      . cyclobenzaprine (FLEXERIL) 10 MG tablet Take 0.5-1 tablets (5-10 mg total) by mouth 3 (three) times daily as needed for muscle spasms (sedation caution).  30 tablet  0  . estradiol-norethindrone (MIMVEY) 1-0.5 MG per tablet Take 1 tablet by mouth daily.      . fexofenadine (ALLEGRA) 180 MG tablet Take 180 mg by mouth daily.        . fluconazole (DIFLUCAN) 150 MG tablet 1 tab every 3 days as needed  2  tablet  1  . levothyroxine (SYNTHROID, LEVOTHROID) 112 MCG tablet Take 112 mcg by mouth daily.      . metFORMIN (GLUCOPHAGE) 500 MG tablet Take 1,000 mg by mouth daily.       . mometasone (NASONEX) 50 MCG/ACT nasal spray Place 2 sprays into the nose daily.  17 g  11  . Multiple Vitamin (MULTIVITAMIN) capsule Take 1 capsule by mouth daily.        [name] PROAIR HFA 108 (90 BASE) MCG/ACT inhaler INHALE TWO PUFFS INTO LUNGS 4 TIMES DAILY AS NEEDED  9 each  11  . SYMBICORT 160-4.5 MCG/ACT inhaler INHALE TWO PUFFS INTO LUNGS TWICE DAILY  10.2 Inhaler  11  . tolterodine (DETROL LA) 2 MG 24 hr capsule Take 2 mg by mouth as needed.      . valACYclovir (VALTREX) 1000 MG  tablet Take 1 tablet (1,000 mg total) by mouth 3 (three) times daily.  21 tablet  0  . valACYclovir (VALTREX) 500 MG tablet Take 500 mg by mouth daily.       No current facility-administered medications on file prior to visit.   Review of Systems  Constitutional: Negative for unusual diaphoresis or other sweats  HENT: Negative for ringing in ear Eyes: Negative for double vision or worsening visual disturbance.  Respiratory: Negative for choking and stridor.   Gastrointestinal: Negative for vomiting or other signifcant bowel change Genitourinary: Negative for hematuria or decreased urine volume.  Musculoskeletal: Negative for other MSK pain or swelling Skin: Negative for color change and worsening wound.  Neurological: Negative for tremors and numbness other than noted  Psychiatric/Behavioral: Negative for decreased concentration or agitation other than above       Objective:   Physical Exam BP 112/72  Pulse 81  Temp(Src) 99.1 F (37.3 C) (Oral)  Wt 222 lb 4 oz (100.812 kg)  SpO2 98% VS noted, mild ill Constitutional: Pt appears well-developed, well-nourished.  HENT: Head: NCAT.  Right Ear: External ear normal.  Left Ear: External ear normal.  Bilat tm's with mild erythema.  Max sinus areas mild tender.  Pharynx with mild erythema, no exudate Eyes: . Pupils are equal, round, and reactive to light. Conjunctivae and EOM are normal Neck: Normal range of motion. Neck supple.  Cardiovascular: Normal rate and regular rhythm.   Pulmonary/Chest: Effort normal and breath sounds decresaed bilat, few wheeze bilat  Neurological: Pt is alert. Not confused , motor grossly intact Skin: Skin is warm. No rash Psychiatric: Pt behavior is normal. No agitation.     Assessment & Plan:

## 2014-07-01 NOTE — Assessment & Plan Note (Addendum)
Mild to mod, for depomedrol IM, predpac asd, cont;d inhaler prn, to f/u any worsening symptoms or concerns

## 2014-07-01 NOTE — Assessment & Plan Note (Signed)
Mild to mod, for antibx course,  to f/u any worsening symptoms or concerns 

## 2014-07-01 NOTE — Progress Notes (Signed)
Pre visit review using our clinic review tool, if applicable. No additional management support is needed unless otherwise documented below in the visit note. 

## 2014-07-01 NOTE — Assessment & Plan Note (Signed)
stable overall by history and exam, recent data reviewed with pt, and pt to continue medical treatment as before,  to f/u any worsening symptoms or concerns Lab Results  Component Value Date   HGBA1C 5.3 07/28/2011   Pt to call for onset polys with illness/steroid tx or cbg > 200

## 2014-07-01 NOTE — Patient Instructions (Signed)
You had the steroid shot today  Please take all new medication as prescribed - the antibiotic, cough medicine, and prednisone  Please continue all other medications as before, including your inhaler  Please have the pharmacy call with any other refills you may need.  Please keep your appointments with your specialists as you may have planned

## 2014-07-01 NOTE — Assessment & Plan Note (Signed)
Stable,

## 2014-07-01 NOTE — Addendum Note (Signed)
Addended by: Sharon Seller B on: 07/01/2014 01:20 PM   Modules accepted: Orders

## 2014-07-29 ENCOUNTER — Other Ambulatory Visit (INDEPENDENT_AMBULATORY_CARE_PROVIDER_SITE_OTHER): Payer: BC Managed Care – PPO

## 2014-07-29 DIAGNOSIS — Z0000 Encounter for general adult medical examination without abnormal findings: Secondary | ICD-10-CM

## 2014-07-29 DIAGNOSIS — IMO0002 Reserved for concepts with insufficient information to code with codable children: Secondary | ICD-10-CM

## 2014-07-29 DIAGNOSIS — E1165 Type 2 diabetes mellitus with hyperglycemia: Secondary | ICD-10-CM

## 2014-07-29 LAB — URINALYSIS, ROUTINE W REFLEX MICROSCOPIC
Bilirubin Urine: NEGATIVE
Ketones, ur: NEGATIVE
LEUKOCYTES UA: NEGATIVE
NITRITE: NEGATIVE
RBC / HPF: NONE SEEN (ref 0–?)
SPECIFIC GRAVITY, URINE: 1.01 (ref 1.000–1.030)
Total Protein, Urine: NEGATIVE
UROBILINOGEN UA: 0.2 (ref 0.0–1.0)
Urine Glucose: NEGATIVE
WBC UA: NONE SEEN (ref 0–?)
pH: 6.5 (ref 5.0–8.0)

## 2014-07-29 LAB — CBC WITH DIFFERENTIAL/PLATELET
Basophils Absolute: 0 10*3/uL (ref 0.0–0.1)
Basophils Relative: 0.6 % (ref 0.0–3.0)
EOS PCT: 1.5 % (ref 0.0–5.0)
Eosinophils Absolute: 0.1 10*3/uL (ref 0.0–0.7)
HEMATOCRIT: 43.1 % (ref 36.0–46.0)
Hemoglobin: 14.2 g/dL (ref 12.0–15.0)
LYMPHS ABS: 2.2 10*3/uL (ref 0.7–4.0)
Lymphocytes Relative: 34.6 % (ref 12.0–46.0)
MCHC: 32.9 g/dL (ref 30.0–36.0)
MCV: 92.2 fl (ref 78.0–100.0)
MONO ABS: 0.6 10*3/uL (ref 0.1–1.0)
Monocytes Relative: 8.8 % (ref 3.0–12.0)
Neutro Abs: 3.5 10*3/uL (ref 1.4–7.7)
Neutrophils Relative %: 54.5 % (ref 43.0–77.0)
PLATELETS: 261 10*3/uL (ref 150.0–400.0)
RBC: 4.68 Mil/uL (ref 3.87–5.11)
RDW: 13.8 % (ref 11.5–15.5)
WBC: 6.3 10*3/uL (ref 4.0–10.5)

## 2014-07-29 LAB — LIPID PANEL
CHOLESTEROL: 176 mg/dL (ref 0–200)
HDL: 68.8 mg/dL (ref >39.00)
LDL Cholesterol: 100 mg/dL — ABNORMAL HIGH (ref 0–99)
NonHDL: 107.2
TRIGLYCERIDES: 35 mg/dL (ref 0.0–149.0)
Total CHOL/HDL Ratio: 3
VLDL: 7 mg/dL (ref 0.0–40.0)

## 2014-07-29 LAB — BASIC METABOLIC PANEL
BUN: 16 mg/dL (ref 6–23)
CALCIUM: 9.9 mg/dL (ref 8.4–10.5)
CO2: 20 mEq/L (ref 19–32)
Chloride: 106 mEq/L (ref 96–112)
Creatinine, Ser: 1.1 mg/dL (ref 0.4–1.2)
GFR: 67.17 mL/min (ref >60.00)
GLUCOSE: 87 mg/dL (ref 70–99)
Potassium: 4.3 mEq/L (ref 3.5–5.1)
SODIUM: 139 meq/L (ref 135–145)

## 2014-07-29 LAB — HEPATIC FUNCTION PANEL
ALBUMIN: 3.7 g/dL (ref 3.5–5.2)
ALK PHOS: 63 U/L (ref 39–117)
ALT: 20 U/L (ref 0–35)
AST: 20 U/L (ref 0–37)
Bilirubin, Direct: 0.2 mg/dL (ref 0.0–0.3)
Total Bilirubin: 1.1 mg/dL (ref 0.2–1.2)
Total Protein: 7.5 g/dL (ref 6.0–8.3)

## 2014-07-29 LAB — TSH: TSH: 2.42 u[IU]/mL (ref 0.35–4.50)

## 2014-07-30 ENCOUNTER — Encounter: Payer: Self-pay | Admitting: Internal Medicine

## 2014-07-30 ENCOUNTER — Ambulatory Visit (INDEPENDENT_AMBULATORY_CARE_PROVIDER_SITE_OTHER): Payer: BC Managed Care – PPO | Admitting: Internal Medicine

## 2014-07-30 VITALS — BP 122/80 | HR 88 | Temp 98.2°F | Wt 219.2 lb

## 2014-07-30 DIAGNOSIS — Z0000 Encounter for general adult medical examination without abnormal findings: Secondary | ICD-10-CM

## 2014-07-30 DIAGNOSIS — E119 Type 2 diabetes mellitus without complications: Secondary | ICD-10-CM

## 2014-07-30 MED ORDER — FLUCONAZOLE 150 MG PO TABS: ORAL_TABLET | ORAL | Status: DC

## 2014-07-30 NOTE — Assessment & Plan Note (Signed)

## 2014-07-30 NOTE — Progress Notes (Signed)
Subjective:    Patient ID: Suzanne Hansen, female    DOB: 07/28/1956, 58 y.o.   MRN: [id number]  HPI  Here for wellness and f/u;  Overall doing ok;  Pt denies CP, worsening SOB, DOE, wheezing, orthopnea, PND, worsening LE edema, palpitations, dizziness or syncope.  Pt denies neurological change such as new headache, facial or extremity weakness.  Pt denies polydipsia, polyuria, or low sugar symptoms. Pt states overall good compliance with treatment and medications, good tolerability, and has been trying to follow lower cholesterol diet.  Pt denies worsening depressive symptoms, suicidal ideation or panic. No fever, night sweats, wt loss, loss of appetite, or other constitutional symptoms.  Pt states good ability with ADL's, has low fall risk, home safety reviewed and adequate, no other significant changes in hearing or vision, and only occasionally active with exercise. Wt Readings from Last 3 Encounters:  07/30/14 219 lb 4 oz (99.451 kg)  07/01/14 222 lb 4 oz (100.812 kg)  06/10/14 222 lb 2 oz (100.755 kg)  Has recently moved to new location where she can walk more, less pit bullts around. Also has Physiological scientist, with gym 3-4 times per wk.  Now divorced.  Had normal DXA per pt with her GYN approx mar 2015, also neg pap and mammogram.  Sees endo for tyroid, has occas hot flashes, now also takng estrogen per GYN Past Medical History  Diagnosis Date  . Hypoactive thyroid   . Asthma   . Allergy   . HERPES SIMPLEX, UNCOMPLICATED 6/62/9476  . HYPERTHYROIDISM 05/23/2007  . HYPOTHYROIDISM 05/09/2008  . VITAMIN D DEFICIENCY 08/28/2009  . GLUCOSE INTOLERANCE 08/28/2009  . OBESITY 05/23/2007  . ANXIETY 05/23/2007  . SINUSITIS- ACUTE-NOS 11/03/2008  . URI 08/28/2009  . ALLERGIC RHINITIS 05/09/2008  . ASTHMA 05/23/2007  . UTI 04/29/2008  . ENDOMETRIOSIS NOS 05/23/2007  . CONTACT DERMATITIS 06/09/2009  . UNSPECIFIED URTICARIA 06/04/2009  . SHOULDER PAIN, LEFT 05/17/2010  . LATERAL EPICONDYLITIS, RIGHT  03/10/2009  . ARM PAIN, LEFT 10/23/2009  . FACIAL PAIN 06/02/2010  . Wheezing 07/12/2010  . Unspecified Chest Pain 07/25/2008  . FREQUENCY, URINARY 05/17/2010  . INSOMNIA, HX OF 05/23/2007  . LIPOMA 10/22/2010  . SKIN LESION 11/10/2010  . NECK PAIN 11/10/2010  . Impaired glucose tolerance 01/28/2011  . Diabetes mellitus   . Plantar fasciitis     Both feet   Past Surgical History  Procedure Laterality Date  . Endometrial ablation    . Wisdom tooth extraction    . Uterine fibroid surgery    . Laminectomy  1996  . Laparotomy      exploratory  . S/p endometrial ablation  12/06  . Tubal ligation      reports that she quit smoking about 26 years ago. Her smoking use included Cigarettes. She smoked 0.50 packs per day. She has never used smokeless tobacco. She reports that she does not drink alcohol or use illicit drugs. family history includes COPD in her father; Cancer in her maternal grandmother; Diabetes in her cousin, mother, and sister; Heart disease in her cousin and mother; Hypertension in her cousin and father; Kidney disease in her sister. Allergies  Allergen Reactions  . Ivp Dye [Iodinated Diagnostic Agents] Shortness Of Breath and Other (See Comments)    Swelling of extremity of the injection site  . Latex Itching    ITCHING AND COLD SORES AROUND MOUTH WHEN LATEX GLOVES USED BY THE DENTIST/HYGENTIST  . Clarithromycin     REACTION: nausea  vomiting  .  Metronidazole Nausea And Vomiting   Current Outpatient Prescriptions on File Prior to Visit  Medication Sig Dispense Refill  . aspirin 81 MG EC tablet Take 81 mg by mouth daily.        . Calcium Citrate-Vitamin D (CITRACAL PETITES/VITAMIN D PO) Take 1 tablet by mouth daily.      . cyclobenzaprine (FLEXERIL) 10 MG tablet Take 0.5-1 tablets (5-10 mg total) by mouth 3 (three) times daily as needed for muscle spasms (sedation caution).  30 tablet  0  . estradiol-norethindrone (MIMVEY) 1-0.5 MG per tablet Take 1 tablet by mouth daily.      .  fexofenadine (ALLEGRA) 180 MG tablet Take 180 mg by mouth daily.        [name] levothyroxine (SYNTHROID, LEVOTHROID) 112 MCG tablet Take 112 mcg by mouth daily.      . metFORMIN (GLUCOPHAGE) 500 MG tablet Take 1,000 mg by mouth daily.       . mometasone (NASONEX) 50 MCG/ACT nasal spray Place 2 sprays into the nose daily.  17 g  11  . Multiple Vitamin (MULTIVITAMIN) capsule Take 1 capsule by mouth daily.        [name] PROAIR HFA 108 (90 BASE) MCG/ACT inhaler INHALE TWO PUFFS INTO LUNGS 4 TIMES DAILY AS NEEDED  9 each  11  . SYMBICORT 160-4.5 MCG/ACT inhaler INHALE TWO PUFFS INTO LUNGS TWICE DAILY  10.2 Inhaler  11  . tolterodine (DETROL LA) 2 MG 24 hr capsule Take 2 mg by mouth as needed.      . fluconazole (DIFLUCAN) 150 MG tablet 1 tab every 3 days as needed  2 tablet  1  . valACYclovir (VALTREX) 1000 MG tablet Take 1 tablet (1,000 mg total) by mouth 3 (three) times daily.  21 tablet  0  . valACYclovir (VALTREX) 500 MG tablet Take 500 mg by mouth daily.       No current facility-administered medications on file prior to visit.    Review of Systems Constitutional: Negative for increased diaphoresis, other activity, appetite or other siginficant weight change  HENT: Negative for worsening hearing loss, ear pain, facial swelling, mouth sores and neck stiffness.   Eyes: Negative for other worsening pain, redness or visual disturbance.  Respiratory: Negative for shortness of breath and wheezing.   Cardiovascular: Negative for chest pain and palpitations.  Gastrointestinal: Negative for diarrhea, blood in stool, abdominal distention or other pain Genitourinary: Negative for hematuria, flank pain or change in urine volume.  Musculoskeletal: Negative for myalgias or other joint complaints.  Skin: Negative for color change and wound.  Neurological: Negative for syncope and numbness. other than noted Hematological: Negative for adenopathy. or other swelling Psychiatric/Behavioral: Negative for  hallucinations, self-injury, decreased concentration or other worsening agitation.      Objective:   Physical Exam BP 122/80  Pulse 88  Temp(Src) 98.2 F (36.8 C) (Oral)  Wt 219 lb 4 oz (99.451 kg)  SpO2 92% VS noted,  Constitutional: Pt is oriented to person, place, and time. Appears well-developed and well-nourished.  Head: Normocephalic and atraumatic.  Right Ear: External ear normal.  Left Ear: External ear normal.  Nose: Nose normal.  Mouth/Throat: Oropharynx is clear and moist.  Eyes: Conjunctivae and EOM are normal. Pupils are equal, round, and reactive to light.  Neck: Normal range of motion. Neck supple. No JVD present. No tracheal deviation present.  Cardiovascular: Normal rate, regular rhythm, normal heart sounds and intact distal pulses.   Pulmonary/Chest: Effort normal and breath sounds without rales or  wheezing  Abdominal: Soft. Bowel sounds are normal. NT. No HSM  Musculoskeletal: Normal range of motion. Exhibits no edema.  Lymphadenopathy:  Has no cervical adenopathy.  Neurological: Pt is alert and oriented to person, place, and time. Pt has normal reflexes. No cranial nerve deficit. Motor grossly intact Skin: Skin is warm and dry. No rash noted.  Psychiatric:  Has mild nervous mood and affect. Behavior is normal.     Assessment & Plan:

## 2014-07-30 NOTE — Assessment & Plan Note (Signed)
stable overall by history and exam, recent data reviewed with pt, and pt to continue medical treatment as before,  to f/u any worsening symptoms or concerns Lab Results  Component Value Date   WBC 6.3 07/29/2014   HGB 14.2 07/29/2014   HCT 43.1 07/29/2014   PLT 261.0 07/29/2014   GLUCOSE 87 07/29/2014   CHOL 176 07/29/2014   TRIG 35.0 07/29/2014   HDL 68.80 07/29/2014   LDLCALC 100* 07/29/2014   ALT 20 07/29/2014   AST 20 07/29/2014   NA 139 07/29/2014   K 4.3 07/29/2014   CL 106 07/29/2014   CREATININE 1.1 07/29/2014   BUN 16 07/29/2014   CO2 20 07/29/2014   TSH 2.42 07/29/2014   HGBA1C 5.3 07/28/2011

## 2014-07-30 NOTE — Progress Notes (Signed)
Pre visit review using our clinic review tool, if applicable. No additional management support is needed unless otherwise documented below in the visit note. 

## 2014-07-30 NOTE — Patient Instructions (Addendum)
Please continue all other medications as before, and refills have been done if requested.  Please have the pharmacy call with any other refills you may need.  Please continue your efforts at being more active, low cholesterol diet, and weight control.  You are otherwise up to date with prevention measures today.  Please keep your appointments with your specialists as you may have planned  No further lab testing needed; your Labs were OK today  Please return in 6 months, or sooner if needed, with Lab testing done 3-5 days before

## 2014-10-09 ENCOUNTER — Ambulatory Visit (INDEPENDENT_AMBULATORY_CARE_PROVIDER_SITE_OTHER): Payer: BC Managed Care – PPO | Admitting: Internal Medicine

## 2014-10-09 ENCOUNTER — Encounter: Payer: Self-pay | Admitting: Internal Medicine

## 2014-10-09 VITALS — BP 112/80 | HR 88 | Temp 98.7°F | Ht 65.5 in | Wt 220.2 lb

## 2014-10-09 DIAGNOSIS — J309 Allergic rhinitis, unspecified: Secondary | ICD-10-CM

## 2014-10-09 DIAGNOSIS — J019 Acute sinusitis, unspecified: Secondary | ICD-10-CM | POA: Insufficient documentation

## 2014-10-09 DIAGNOSIS — R062 Wheezing: Secondary | ICD-10-CM

## 2014-10-09 MED ORDER — LEVOFLOXACIN 250 MG PO TABS: 250.0000 mg | ORAL_TABLET | Freq: Every day | ORAL | Status: DC

## 2014-10-09 MED ORDER — PREDNISONE 10 MG PO TABS: ORAL_TABLET | ORAL | Status: DC

## 2014-10-09 MED ORDER — HYDROCOD POLST-CHLORPHEN POLST 10-8 MG/5ML PO LQCR: 5.0000 mL | Freq: Two times a day (BID) | ORAL | Status: DC | PRN

## 2014-10-09 NOTE — Progress Notes (Signed)
Pre visit review using our clinic review tool, if applicable. No additional management support is needed unless otherwise documented below in the visit note. 

## 2014-10-09 NOTE — Patient Instructions (Signed)
Please take all new medication as prescribed  - the antibiotic, cough medicine, and prednisone  Please continue all other medications as before, and refills have been done if requested.  You are otherwise up to date with prevention measures today.  Please keep your appointments with your specialists as you may have planned

## 2014-10-09 NOTE — Progress Notes (Signed)
Subjective:    Patient ID: Suzanne Hansen, female    DOB: 1956-07-05, 58 y.o.   MRN: [id number]  HPI   Here with 2-3 days acute onset fever, facial pain, pressure, headache, general weakness and malaise, and greenish d/c, with mild ST and cough, but pt denies chest pain, wheezing, increased sob or doe, orthopnea, PND, increased LE swelling, palpitations, dizziness or syncope.  Does have several wks ongoing nasal allergy symptoms with clearish congestion, itch and sneezing, without fever, pain, ST, cough, swelling or wheezing.  Past Medical History  Diagnosis Date  . Hypoactive thyroid   . Asthma   . Allergy   . HERPES SIMPLEX, UNCOMPLICATED 12/23/4006  . HYPERTHYROIDISM 05/23/2007  . HYPOTHYROIDISM 05/09/2008  . VITAMIN D DEFICIENCY 08/28/2009  . GLUCOSE INTOLERANCE 08/28/2009  . OBESITY 05/23/2007  . ANXIETY 05/23/2007  . SINUSITIS- ACUTE-NOS 11/03/2008  . URI 08/28/2009  . ALLERGIC RHINITIS 05/09/2008  . ASTHMA 05/23/2007  . UTI 04/29/2008  . ENDOMETRIOSIS NOS 05/23/2007  . CONTACT DERMATITIS 06/09/2009  . UNSPECIFIED URTICARIA 06/04/2009  . SHOULDER PAIN, LEFT 05/17/2010  . LATERAL EPICONDYLITIS, RIGHT 03/10/2009  . ARM PAIN, LEFT 10/23/2009  . FACIAL PAIN 06/02/2010  . Wheezing 07/12/2010  . Unspecified Chest Pain 07/25/2008  . FREQUENCY, URINARY 05/17/2010  . INSOMNIA, HX OF 05/23/2007  . LIPOMA 10/22/2010  . SKIN LESION 11/10/2010  . NECK PAIN 11/10/2010  . Impaired glucose tolerance 01/28/2011  . Diabetes mellitus   . Plantar fasciitis     Both feet   Past Surgical History  Procedure Laterality Date  . Endometrial ablation    . Wisdom tooth extraction    . Uterine fibroid surgery    . Laminectomy  1996  . Laparotomy      exploratory  . S/p endometrial ablation  12/06  . Tubal ligation      reports that she quit smoking about 26 years ago. Her smoking use included Cigarettes. She smoked 0.50 packs per day. She has never used smokeless tobacco. She reports that she does not drink  alcohol or use illicit drugs. family history includes COPD in her father; Cancer in her maternal grandmother; Diabetes in her cousin, mother, and sister; Heart disease in her cousin and mother; Hypertension in her cousin and father; Kidney disease in her sister. Allergies  Allergen Reactions  . Ivp Dye [Iodinated Diagnostic Agents] Shortness Of Breath and Other (See Comments)    Swelling of extremity of the injection site  . Latex Itching    ITCHING AND COLD SORES AROUND MOUTH WHEN LATEX GLOVES USED BY THE DENTIST/HYGENTIST  . Clarithromycin     REACTION: nausea  vomiting  . Metronidazole Nausea And Vomiting   Current Outpatient Prescriptions on File Prior to Visit  Medication Sig Dispense Refill  . aspirin 81 MG EC tablet Take 81 mg by mouth daily.      . Calcium Citrate-Vitamin D (CITRACAL PETITES/VITAMIN D PO) Take 1 tablet by mouth daily.    . cyclobenzaprine (FLEXERIL) 10 MG tablet Take 0.5-1 tablets (5-10 mg total) by mouth 3 (three) times daily as needed for muscle spasms (sedation caution). 30 tablet 0  . estradiol-norethindrone (MIMVEY) 1-0.5 MG per tablet Take 1 tablet by mouth daily.    . fexofenadine (ALLEGRA) 180 MG tablet Take 180 mg by mouth daily.      . fluconazole (DIFLUCAN) 150 MG tablet 1 tab every 3 days as needed 2 tablet 1  . levothyroxine (SYNTHROID, LEVOTHROID) 112 MCG tablet Take 112 mcg by mouth  daily.    . metFORMIN (GLUCOPHAGE) 500 MG tablet Take 1,000 mg by mouth daily.     . mometasone (NASONEX) 50 MCG/ACT nasal spray Place 2 sprays into the nose daily. 17 g 11  . Multiple Vitamin (MULTIVITAMIN) capsule Take 1 capsule by mouth daily.      [name] PROAIR HFA 108 (90 BASE) MCG/ACT inhaler INHALE TWO PUFFS INTO LUNGS 4 TIMES DAILY AS NEEDED 9 each 11  . SYMBICORT 160-4.5 MCG/ACT inhaler INHALE TWO PUFFS INTO LUNGS TWICE DAILY 10.2 Inhaler 11  . tolterodine (DETROL LA) 2 MG 24 hr capsule Take 2 mg by mouth as needed.    . valACYclovir (VALTREX) 1000 MG tablet Take 1  tablet (1,000 mg total) by mouth 3 (three) times daily. 21 tablet 0  . valACYclovir (VALTREX) 500 MG tablet Take 500 mg by mouth daily.     No current facility-administered medications on file prior to visit.   Review of Systems  Constitutional: Negative for unusual diaphoresis or other sweats  HENT: Negative for ringing in ear Eyes: Negative for double vision or worsening visual disturbance.  Respiratory: Negative for choking and stridor.   Gastrointestinal: Negative for vomiting or other signifcant bowel change Genitourinary: Negative for hematuria or decreased urine volume.  Musculoskeletal: Negative for other MSK pain or swelling Skin: Negative for color change and worsening wound.  Neurological: Negative for tremors and numbness other than noted  Psychiatric/Behavioral: Negative for decreased concentration or agitation other than above       Objective:   Physical Exam BP 112/80 mmHg  Pulse 88  Temp(Src) 98.7 F (37.1 C)  Ht 5' 5.5" (1.664 m)  Wt 220 lb 4 oz (99.905 kg)  BMI 36.08 kg/m2  SpO2 99% VS noted, mild ill Constitutional: Pt appears well-developed, well-nourished.  HENT: Head: NCAT.  Right Ear: External ear normal.  Left Ear: External ear normal.  Eyes: . Pupils are equal, round, and reactive to light. Conjunctivae and EOM are normal Bilat tm's with mild erythema.  Max sinus areas mild tender.  Pharynx with mild erythema, no exudate Neck: Normal range of motion. Neck supple.  Cardiovascular: Normal rate and regular rhythm.   Pulmonary/Chest: Effort normal and breath sounds without rales but with midl few bilat wheezing.  Neurological: Pt is alert. Not confused , motor grossly intact Skin: Skin is warm. No rash Psychiatric: Pt behavior is normal. No agitation.     Assessment & Plan:

## 2014-10-10 ENCOUNTER — Other Ambulatory Visit: Payer: Self-pay | Admitting: Internal Medicine

## 2014-10-11 NOTE — Assessment & Plan Note (Signed)
Also for nasacort asd daily for uncontrolled symtpoms, consider allergy referral

## 2014-10-11 NOTE — Assessment & Plan Note (Signed)
Mild to mod, for antibx course,  to f/u any worsening symptoms or concerns 

## 2014-10-11 NOTE — Assessment & Plan Note (Signed)
Mild, for short course predpac asd,  to f/u any worsening symptoms or concerns SpO2 Readings from Last 3 Encounters:  10/09/14 99%  07/30/14 92%  07/01/14 98%

## 2014-11-21 ENCOUNTER — Other Ambulatory Visit: Payer: Self-pay | Admitting: Internal Medicine

## 2014-12-04 LAB — HM DIABETES EYE EXAM

## 2014-12-30 ENCOUNTER — Other Ambulatory Visit: Payer: Self-pay | Admitting: Gynecology

## 2015-04-01 ENCOUNTER — Telehealth: Payer: Self-pay

## 2015-04-01 ENCOUNTER — Telehealth: Payer: Self-pay | Admitting: Internal Medicine

## 2015-04-01 NOTE — Telephone Encounter (Signed)
Received records from [facility] forwarded to Dr. Cathlean Cower 04/01/15 fbg.

## 2015-04-01 NOTE — Telephone Encounter (Signed)
Left pt a VM to return call to collect HgA1C

## 2015-04-04 ENCOUNTER — Ambulatory Visit (INDEPENDENT_AMBULATORY_CARE_PROVIDER_SITE_OTHER): Payer: BLUE CROSS/BLUE SHIELD | Admitting: Internal Medicine

## 2015-04-04 ENCOUNTER — Encounter: Payer: Self-pay | Admitting: Internal Medicine

## 2015-04-04 VITALS — BP 120/80 | Temp 98.4°F | Ht 65.5 in | Wt 224.0 lb

## 2015-04-04 DIAGNOSIS — R109 Unspecified abdominal pain: Secondary | ICD-10-CM | POA: Diagnosis not present

## 2015-04-04 DIAGNOSIS — M545 Low back pain, unspecified: Secondary | ICD-10-CM

## 2015-04-04 LAB — POCT URINALYSIS DIPSTICK
Bilirubin, UA: NEGATIVE
Blood, UA: NEGATIVE
Glucose, UA: NEGATIVE
Ketones, UA: NEGATIVE
LEUKOCYTES UA: NEGATIVE
Nitrite, UA: NEGATIVE
PH UA: 7
Protein, UA: NEGATIVE
Spec Grav, UA: 1.01
UROBILINOGEN UA: 0.2

## 2015-04-04 NOTE — Patient Instructions (Signed)
No evidence of kidney infection or blood .   Suspect mechanical  Cause of back pain . Possible  Back strain.     contactu medical team if fever radiation to abd or  Concerns . Most times gets  Better in 3-4 week s.   Back Exercises These exercises may help you when beginning to rehabilitate your injury. Your symptoms may resolve with or without further involvement from your physician, physical therapist or athletic trainer. While completing these exercises, remember:   Restoring tissue flexibility helps normal motion to return to the joints. This allows healthier, less painful movement and activity.  An effective stretch should be held for at least 30 seconds.  A stretch should never be painful. You should only feel a gentle lengthening or release in the stretched tissue. STRETCH - Extension, Prone on Elbows   Lie on your stomach on the floor, a bed will be too soft. Place your palms about shoulder width apart and at the height of your head.  Place your elbows under your shoulders. If this is too painful, stack pillows under your chest.  Allow your body to relax so that your hips drop lower and make contact more completely with the floor.  Hold this position for __________ seconds.  Slowly return to lying flat on the floor. Repeat __________ times. Complete this exercise __________ times per day.  RANGE OF MOTION - Extension, Prone Press Ups   Lie on your stomach on the floor, a bed will be too soft. Place your palms about shoulder width apart and at the height of your head.  Keeping your back as relaxed as possible, slowly straighten your elbows while keeping your hips on the floor. You may adjust the placement of your hands to maximize your comfort. As you gain motion, your hands will come more underneath your shoulders.  Hold this position __________ seconds.  Slowly return to lying flat on the floor. Repeat __________ times. Complete this exercise __________ times per day.    RANGE OF MOTION- Quadruped, Neutral Spine   Assume a hands and knees position on a firm surface. Keep your hands under your shoulders and your knees under your hips. You may place padding under your knees for comfort.  Drop your head and point your tail bone toward the ground below you. This will round out your low back like an angry cat. Hold this position for __________ seconds.  Slowly lift your head and release your tail bone so that your back sags into a large arch, like an old horse.  Hold this position for __________ seconds.  Repeat this until you feel limber in your low back.  Now, find your "sweet spot." This will be the most comfortable position somewhere between the two previous positions. This is your neutral spine. Once you have found this position, tense your stomach muscles to support your low back.  Hold this position for __________ seconds. Repeat __________ times. Complete this exercise __________ times per day.  STRETCH - Flexion, Single Knee to Chest   Lie on a firm bed or floor with both legs extended in front of you.  Keeping one leg in contact with the floor, bring your opposite knee to your chest. Hold your leg in place by either grabbing behind your thigh or at your knee.  Pull until you feel a gentle stretch in your low back. Hold __________ seconds.  Slowly release your grasp and repeat the exercise with the opposite side. Repeat __________ times. Complete this  exercise __________ times per day.  STRETCH - Hamstrings, Standing  Stand or sit and extend your right / left leg, placing your foot on a chair or foot stool  Keeping a slight arch in your low back and your hips straight forward.  Lead with your chest and lean forward at the waist until you feel a gentle stretch in the back of your right / left knee or thigh. (When done correctly, this exercise requires leaning only a small distance.)  Hold this position for __________ seconds. Repeat __________  times. Complete this stretch __________ times per day. STRENGTHENING - Deep Abdominals, Pelvic Tilt   Lie on a firm bed or floor. Keeping your legs in front of you, bend your knees so they are both pointed toward the ceiling and your feet are flat on the floor.  Tense your lower abdominal muscles to press your low back into the floor. This motion will rotate your pelvis so that your tail bone is scooping upwards rather than pointing at your feet or into the floor.  With a gentle tension and even breathing, hold this position for __________ seconds. Repeat __________ times. Complete this exercise __________ times per day.  STRENGTHENING - Abdominals, Crunches   Lie on a firm bed or floor. Keeping your legs in front of you, bend your knees so they are both pointed toward the ceiling and your feet are flat on the floor. Cross your arms over your chest.  Slightly tip your chin down without bending your neck.  Tense your abdominals and slowly lift your trunk high enough to just clear your shoulder blades. Lifting higher can put excessive stress on the low back and does not further strengthen your abdominal muscles.  Control your return to the starting position. Repeat __________ times. Complete this exercise __________ times per day.  STRENGTHENING - Quadruped, Opposite UE/LE Lift   Assume a hands and knees position on a firm surface. Keep your hands under your shoulders and your knees under your hips. You may place padding under your knees for comfort.  Find your neutral spine and gently tense your abdominal muscles so that you can maintain this position. Your shoulders and hips should form a rectangle that is parallel with the floor and is not twisted.  Keeping your trunk steady, lift your right hand no higher than your shoulder and then your left leg no higher than your hip. Make sure you are not holding your breath. Hold this position __________ seconds.  Continuing to keep your abdominal  muscles tense and your back steady, slowly return to your starting position. Repeat with the opposite arm and leg. Repeat __________ times. Complete this exercise __________ times per day. Document Released: 10/14/2005 Document Revised: 12/19/2011 Document Reviewed: 01/08/2009 [facility] Patient Information 2015 [place], [place]. This information is not intended to replace advice given to you by your health care provider. Make sure you discuss any questions you have with your health care provider.

## 2015-04-04 NOTE — Progress Notes (Signed)
Chief Complaint  Patient presents with  . left lower back pain    HPI: Patient comes in today for [facility] Saturday clinic for  new problem evaluation. Left back  Discomfort pain yesterday and wasn't feeling well  All day.  No fever no nvd  No uti sx  Radiating up arm and neck .   No renal stones .  No uti sx   No renal infection.  ? If so . Hx of uti and blood .  up and down as far as work but no injury  Lifted some water.  Walking exercise. recently     Has dm and fu this week sugars up and down  ROS: See pertinent positives and negatives per HPI. Has hemorrhoids sx  itching  Med per gyne  Past Medical History  Diagnosis Date  . Hypoactive thyroid   . Asthma   . Allergy   . HERPES SIMPLEX, UNCOMPLICATED 0/99/8338  . HYPERTHYROIDISM 05/23/2007  . HYPOTHYROIDISM 05/09/2008  . VITAMIN D DEFICIENCY 08/28/2009  . GLUCOSE INTOLERANCE 08/28/2009  . OBESITY 05/23/2007  . ANXIETY 05/23/2007  . SINUSITIS- ACUTE-NOS 11/03/2008  . URI 08/28/2009  . ALLERGIC RHINITIS 05/09/2008  . ASTHMA 05/23/2007  . UTI 04/29/2008  . ENDOMETRIOSIS NOS 05/23/2007  . CONTACT DERMATITIS 06/09/2009  . UNSPECIFIED URTICARIA 06/04/2009  . SHOULDER PAIN, LEFT 05/17/2010  . LATERAL EPICONDYLITIS, RIGHT 03/10/2009  . ARM PAIN, LEFT 10/23/2009  . FACIAL PAIN 06/02/2010  . Wheezing 07/12/2010  . Unspecified Chest Pain 07/25/2008  . FREQUENCY, URINARY 05/17/2010  . INSOMNIA, HX OF 05/23/2007  . LIPOMA 10/22/2010  . SKIN LESION 11/10/2010  . NECK PAIN 11/10/2010  . Impaired glucose tolerance 01/28/2011  . Diabetes mellitus   . Plantar fasciitis     Both feet    Family History  Problem Relation Age of Onset  . Cancer Maternal Grandmother     Breast  . Diabetes Cousin   . Hypertension Cousin   . Heart disease Cousin   . Diabetes Mother   . Heart disease Mother   . COPD Father   . Hypertension Father   . Diabetes Sister   . Kidney disease Sister     History   Social History  . Marital Status: Divorced    Spouse  Name: N/A  . Number of Children: 3  . Years of Education: N/A   Occupational History  . [employer]   Social History Main Topics  . Smoking status: Former Smoker -- 0.50 packs/day    Types: Cigarettes    Quit date: 11/05/1987  . Smokeless tobacco: Never Used  . Alcohol Use: No     Comment: WINE  . Drug Use: No  . Sexual Activity: Not on file   Other Topics Concern  . None   Social History Narrative    Outpatient Prescriptions Prior to Visit  Medication Sig Dispense Refill  . aspirin 81 MG EC tablet Take 81 mg by mouth daily.      . Calcium Citrate-Vitamin D (CITRACAL PETITES/VITAMIN D PO) Take 1 tablet by mouth daily.    [name] estradiol-norethindrone (MIMVEY) 1-0.5 MG per tablet Take 1 tablet by mouth daily.    . fexofenadine (ALLEGRA) 180 MG tablet Take 180 mg by mouth daily.      [name] levothyroxine (SYNTHROID, LEVOTHROID) 112 MCG tablet Take 112 mcg by mouth daily.    . metFORMIN (GLUCOPHAGE) 500 MG tablet Take 1,000 mg by mouth daily.     [name]  mometasone (NASONEX) 50 MCG/ACT nasal spray Place 2 sprays into the nose daily. 17 g 11  . Multiple Vitamin (MULTIVITAMIN) capsule Take 1 capsule by mouth daily.      [name] PROAIR HFA 108 (90 BASE) MCG/ACT inhaler INHALE TWO PUFFS INTO LUNGS 4 TIMES DAILY AS NEEDED 18 each 1  . SYMBICORT 160-4.5 MCG/ACT inhaler INHALE TWO PUFFS INTO LUNGS TWICE DAILY 10.2 Inhaler 11  . tolterodine (DETROL LA) 2 MG 24 hr capsule Take 2 mg by mouth as needed.    . valACYclovir (VALTREX) 1000 MG tablet Take 1 tablet (1,000 mg total) by mouth 3 (three) times daily. 21 tablet 0  . valACYclovir (VALTREX) 500 MG tablet Take 500 mg by mouth daily.    . chlorpheniramine-HYDROcodone (TUSSIONEX PENNKINETIC ER) 10-8 MG/5ML LQCR Take 5 mLs by mouth every 12 (twelve) hours as needed for cough. 115 mL 0  . levofloxacin (LEVAQUIN) 250 MG tablet Take 1 tablet (250 mg total) by mouth daily. 10 tablet 0  . predniSONE (DELTASONE) 10 MG tablet 3 tabs by mouth  per day for 3 days,2tabs per day for 3 days,1tab per day for 3 days 18 tablet 0  . cyclobenzaprine (FLEXERIL) 10 MG tablet Take 0.5-1 tablets (5-10 mg total) by mouth 3 (three) times daily as needed for muscle spasms (sedation caution). (Patient not taking: Reported on 04/04/2015) 30 tablet 0  . fluconazole (DIFLUCAN) 150 MG tablet 1 tab every 3 days as needed (Patient not taking: Reported on 04/04/2015) 2 tablet 1   No facility-administered medications prior to visit.     EXAM:  BP 120/80 mmHg  Temp(Src) 98.4 F (36.9 C) (Oral)  Ht 5' 5.5" (1.664 m)  Wt 224 lb (101.606 kg)  BMI 36.70 kg/m2  Body mass index is 36.7 kg/(m^2).  GENERAL: vitals reviewed and listed above, alert, oriented, appears well hydrated and in no acute distress HEENT: atraumatic, conjunctiva  clear, no obvious abnormalities on inspection of external nose and ears NECK: no obvious masses on inspection palpation  Abdomen:  Sof,t normal bowel sounds without hepatosplenomegaly, no guarding rebound or masses no CVA tenderness CV: HRRR, no clubbing cyanosis  nl cap refill  MS: moves all extremities without noticeable focal  Abnormality Gait normal  Points to left flank lower back area si area but can bned and raise  Some discomfort with twisting.  No acute pain  PSYCH: pleasant and cooperative, no obvious depression or anxiety  ua neg leuk blood  ASSESSMENT AND PLAN:  Discussed the following assessment and plan:  Flank pain - Plan: POCT urinalysis dipstick, POCT urinalysis dipstick  Left-sided low back pain without sciatica Suspect mechanical pain  Based on context and sx and no ass sx .    Expectant management. For fu back hygeine.  in the interim .  dont do abd crunches  But gentle stretching . -Patient advised to return or notify health care team  if symptoms worsen ,persist or new concerns arise.  Patient Instructions  No evidence of kidney infection or blood .   Suspect mechanical  Cause of back pain .  Possible  Back strain.     contactu medical team if fever radiation to abd or  Concerns . Most times gets  Better in 3-4 week s.   Back Exercises These exercises may help you when beginning to rehabilitate your injury. Your symptoms may resolve with or without further involvement from your physician, physical therapist or athletic trainer. While completing these exercises, remember:   Restoring tissue flexibility  helps normal motion to return to the joints. This allows healthier, less painful movement and activity.  An effective stretch should be held for at least 30 seconds.  A stretch should never be painful. You should only feel a gentle lengthening or release in the stretched tissue. STRETCH - Extension, Prone on Elbows   Lie on your stomach on the floor, a bed will be too soft. Place your palms about shoulder width apart and at the height of your head.  Place your elbows under your shoulders. If this is too painful, stack pillows under your chest.  Allow your body to relax so that your hips drop lower and make contact more completely with the floor.  Hold this position for __________ seconds.  Slowly return to lying flat on the floor. Repeat __________ times. Complete this exercise __________ times per day.  RANGE OF MOTION - Extension, Prone Press Ups   Lie on your stomach on the floor, a bed will be too soft. Place your palms about shoulder width apart and at the height of your head.  Keeping your back as relaxed as possible, slowly straighten your elbows while keeping your hips on the floor. You may adjust the placement of your hands to maximize your comfort. As you gain motion, your hands will come more underneath your shoulders.  Hold this position __________ seconds.  Slowly return to lying flat on the floor. Repeat __________ times. Complete this exercise __________ times per day.  RANGE OF MOTION- Quadruped, Neutral Spine   Assume a hands and knees position on a firm  surface. Keep your hands under your shoulders and your knees under your hips. You may place padding under your knees for comfort.  Drop your head and point your tail bone toward the ground below you. This will round out your low back like an angry cat. Hold this position for __________ seconds.  Slowly lift your head and release your tail bone so that your back sags into a large arch, like an old horse.  Hold this position for __________ seconds.  Repeat this until you feel limber in your low back.  Now, find your "sweet spot." This will be the most comfortable position somewhere between the two previous positions. This is your neutral spine. Once you have found this position, tense your stomach muscles to support your low back.  Hold this position for __________ seconds. Repeat __________ times. Complete this exercise __________ times per day.  STRETCH - Flexion, Single Knee to Chest   Lie on a firm bed or floor with both legs extended in front of you.  Keeping one leg in contact with the floor, bring your opposite knee to your chest. Hold your leg in place by either grabbing behind your thigh or at your knee.  Pull until you feel a gentle stretch in your low back. Hold __________ seconds.  Slowly release your grasp and repeat the exercise with the opposite side. Repeat __________ times. Complete this exercise __________ times per day.  STRETCH - Hamstrings, Standing  Stand or sit and extend your right / left leg, placing your foot on a chair or foot stool  Keeping a slight arch in your low back and your hips straight forward.  Lead with your chest and lean forward at the waist until you feel a gentle stretch in the back of your right / left knee or thigh. (When done correctly, this exercise requires leaning only a small distance.)  Hold this position for __________ seconds.  Repeat __________ times. Complete this stretch __________ times per day. STRENGTHENING - Deep Abdominals,  Pelvic Tilt   Lie on a firm bed or floor. Keeping your legs in front of you, bend your knees so they are both pointed toward the ceiling and your feet are flat on the floor.  Tense your lower abdominal muscles to press your low back into the floor. This motion will rotate your pelvis so that your tail bone is scooping upwards rather than pointing at your feet or into the floor.  With a gentle tension and even breathing, hold this position for __________ seconds. Repeat __________ times. Complete this exercise __________ times per day.  STRENGTHENING - Abdominals, Crunches   Lie on a firm bed or floor. Keeping your legs in front of you, bend your knees so they are both pointed toward the ceiling and your feet are flat on the floor. Cross your arms over your chest.  Slightly tip your chin down without bending your neck.  Tense your abdominals and slowly lift your trunk high enough to just clear your shoulder blades. Lifting higher can put excessive stress on the low back and does not further strengthen your abdominal muscles.  Control your return to the starting position. Repeat __________ times. Complete this exercise __________ times per day.  STRENGTHENING - Quadruped, Opposite UE/LE Lift   Assume a hands and knees position on a firm surface. Keep your hands under your shoulders and your knees under your hips. You may place padding under your knees for comfort.  Find your neutral spine and gently tense your abdominal muscles so that you can maintain this position. Your shoulders and hips should form a rectangle that is parallel with the floor and is not twisted.  Keeping your trunk steady, lift your right hand no higher than your shoulder and then your left leg no higher than your hip. Make sure you are not holding your breath. Hold this position __________ seconds.  Continuing to keep your abdominal muscles tense and your back steady, slowly return to your starting position. Repeat with  the opposite arm and leg. Repeat __________ times. Complete this exercise __________ times per day. Document Released: 10/14/2005 Document Revised: 12/19/2011 Document Reviewed: 01/08/2009 [facility] Patient Information 2015 [place], [place]. This information is not intended to replace advice given to you by your health care provider. Make sure you discuss any questions you have with your health care provider.        Standley Brooking. Yanuel Tagg M.D.  Pre visit review using our clinic review tool, if applicable. No additional management support is needed unless otherwise documented below in the visit note.

## 2015-04-28 ENCOUNTER — Telehealth: Payer: Self-pay | Admitting: Internal Medicine

## 2015-04-28 MED ORDER — BUDESONIDE-FORMOTEROL FUMARATE 160-4.5 MCG/ACT IN AERO: 2.0000 | INHALATION_SPRAY | Freq: Two times a day (BID) | RESPIRATORY_TRACT | Status: DC

## 2015-04-28 MED ORDER — ALBUTEROL SULFATE HFA 108 (90 BASE) MCG/ACT IN AERS: 2.0000 | INHALATION_SPRAY | RESPIRATORY_TRACT | Status: DC | PRN

## 2015-04-28 NOTE — Telephone Encounter (Signed)
Patient is requesting refills for [facility] HFA 108 (90 BASE) MCG/ACT inhaler [[id number]] and  SYMBICORT 160-4.5 MCG/ACT inhaler [[id number] Pharmacy is [facility] on [address]

## 2015-04-30 ENCOUNTER — Telehealth: Payer: Self-pay | Admitting: Internal Medicine

## 2015-04-30 NOTE — Telephone Encounter (Signed)
Pt informed

## 2015-04-30 NOTE — Telephone Encounter (Signed)
I am not aware of any , sorry, thanks

## 2015-04-30 NOTE — Telephone Encounter (Signed)
Patient would like to know if there is a manufactures coupon for [employer] and symbicort.

## 2015-05-18 ENCOUNTER — Telehealth: Payer: Self-pay | Admitting: Internal Medicine

## 2015-05-18 MED ORDER — ALBUTEROL SULFATE HFA 108 (90 BASE) MCG/ACT IN AERS: 2.0000 | INHALATION_SPRAY | RESPIRATORY_TRACT | Status: DC | PRN

## 2015-05-18 NOTE — Telephone Encounter (Signed)
Patient is out of proair.  She is requesting another provider approve script for proair to be sent to [facility] on [address] in [place].

## 2015-05-18 NOTE — Telephone Encounter (Signed)
Notified pt refill sent to [facility]...Suzanne Hansen

## 2015-05-29 ENCOUNTER — Ambulatory Visit (INDEPENDENT_AMBULATORY_CARE_PROVIDER_SITE_OTHER): Payer: BLUE CROSS/BLUE SHIELD | Admitting: Internal Medicine

## 2015-05-29 ENCOUNTER — Encounter: Payer: Self-pay | Admitting: Internal Medicine

## 2015-05-29 VITALS — BP 128/84 | HR 68 | Temp 98.0°F | Resp 16

## 2015-05-29 DIAGNOSIS — E039 Hypothyroidism, unspecified: Secondary | ICD-10-CM | POA: Diagnosis not present

## 2015-05-29 DIAGNOSIS — E119 Type 2 diabetes mellitus without complications: Secondary | ICD-10-CM

## 2015-05-29 DIAGNOSIS — Z0000 Encounter for general adult medical examination without abnormal findings: Secondary | ICD-10-CM

## 2015-05-29 DIAGNOSIS — Z23 Encounter for immunization: Secondary | ICD-10-CM

## 2015-05-29 DIAGNOSIS — Z0189 Encounter for other specified special examinations: Secondary | ICD-10-CM

## 2015-05-29 DIAGNOSIS — B029 Zoster without complications: Secondary | ICD-10-CM

## 2015-05-29 DIAGNOSIS — B0229 Other postherpetic nervous system involvement: Secondary | ICD-10-CM

## 2015-05-29 MED ORDER — LIDOCAINE 5 % EX PTCH: 1.0000 | MEDICATED_PATCH | CUTANEOUS | Status: DC

## 2015-05-29 NOTE — Progress Notes (Signed)
Subjective:    Patient ID: Suzanne Hansen, female    DOB: 1956/09/19, 59 y.o.   MRN: [id number]  HPI   Here to f/u, with c/o burning dysesthetic type pain to the left upper/mid back in the area of prior shingles outbreak 11/2102, has been intermittent mild but now mod to severe, no recurrent rash but + mild itching sensation, no swelling or erythema, also assoc with HA and fatigue.  Pt denies chest pain, increased sob or doe, wheezing, orthopnea, PND, increased LE swelling, palpitations, dizziness or syncope.  Pt denies new neurological symptoms such as new headache, or facial or extremity weakness or numbness  Pt denies polydipsia, polyuria.  Denies hyper or hypo thyroid symptoms such as voice, skin or hair change. Past Medical History  Diagnosis Date  . Hypoactive thyroid   . Asthma   . Allergy   . HERPES SIMPLEX, UNCOMPLICATED 7/67/2094  . HYPERTHYROIDISM 05/23/2007  . HYPOTHYROIDISM 05/09/2008  . VITAMIN D DEFICIENCY 08/28/2009  . GLUCOSE INTOLERANCE 08/28/2009  . OBESITY 05/23/2007  . ANXIETY 05/23/2007  . SINUSITIS- ACUTE-NOS 11/03/2008  . URI 08/28/2009  . ALLERGIC RHINITIS 05/09/2008  . ASTHMA 05/23/2007  . UTI 04/29/2008  . ENDOMETRIOSIS NOS 05/23/2007  . CONTACT DERMATITIS 06/09/2009  . UNSPECIFIED URTICARIA 06/04/2009  . SHOULDER PAIN, LEFT 05/17/2010  . LATERAL EPICONDYLITIS, RIGHT 03/10/2009  . ARM PAIN, LEFT 10/23/2009  . FACIAL PAIN 06/02/2010  . Wheezing 07/12/2010  . Unspecified Chest Pain 07/25/2008  . FREQUENCY, URINARY 05/17/2010  . INSOMNIA, HX OF 05/23/2007  . LIPOMA 10/22/2010  . SKIN LESION 11/10/2010  . NECK PAIN 11/10/2010  . Impaired glucose tolerance 01/28/2011  . Diabetes mellitus   . Plantar fasciitis     Both feet   Past Surgical History  Procedure Laterality Date  . Endometrial ablation    . Wisdom tooth extraction    . Uterine fibroid surgery    . Laminectomy  1996  . Laparotomy      exploratory  . S/p endometrial ablation  12/06  . Tubal ligation      reports that she quit smoking about 27 years ago. Her smoking use included Cigarettes. She smoked 0.50 packs per day. She has never used smokeless tobacco. She reports that she does not drink alcohol or use illicit drugs. family history includes COPD in her father; Cancer in her maternal grandmother; Diabetes in her cousin, mother, and sister; Heart disease in her cousin and mother; Hypertension in her cousin and father; Kidney disease in her sister. Allergies  Allergen Reactions  . Ivp Dye [Iodinated Diagnostic Agents] Shortness Of Breath and Other (See Comments)    Swelling of extremity of the injection site  . Latex Itching    ITCHING AND COLD SORES AROUND MOUTH WHEN LATEX GLOVES USED BY THE DENTIST/HYGENTIST  . Clarithromycin     REACTION: nausea  vomiting  . Metronidazole Nausea And Vomiting   Current Outpatient Prescriptions on File Prior to Visit  Medication Sig Dispense Refill  . albuterol (PROAIR HFA) 108 (90 BASE) MCG/ACT inhaler Inhale 2 puffs into the lungs every 4 (four) hours as needed for wheezing or shortness of breath. 18 each 2  . aspirin 81 MG EC tablet Take 81 mg by mouth daily.      . budesonide-formoterol (SYMBICORT) 160-4.5 MCG/ACT inhaler Inhale 2 puffs into the lungs 2 (two) times daily. 10.2 Inhaler 2  . Calcium Citrate-Vitamin D (CITRACAL PETITES/VITAMIN D PO) Take 1 tablet by mouth daily.    . cyclobenzaprine (FLEXERIL)  10 MG tablet Take 0.5-1 tablets (5-10 mg total) by mouth 3 (three) times daily as needed for muscle spasms (sedation caution). 30 tablet 0  . estradiol-norethindrone (MIMVEY) 1-0.5 MG per tablet Take 1 tablet by mouth daily.    . fexofenadine (ALLEGRA) 180 MG tablet Take 180 mg by mouth daily.      . fluconazole (DIFLUCAN) 150 MG tablet 1 tab every 3 days as needed 2 tablet 1  . levothyroxine (SYNTHROID, LEVOTHROID) 112 MCG tablet Take 112 mcg by mouth daily.    . metFORMIN (GLUCOPHAGE) 500 MG tablet Take 1,000 mg by mouth daily.     . mometasone  (NASONEX) 50 MCG/ACT nasal spray Place 2 sprays into the nose daily. 17 g 11  . Multiple Vitamin (MULTIVITAMIN) capsule Take 1 capsule by mouth daily.      [name] tolterodine (DETROL LA) 2 MG 24 hr capsule Take 2 mg by mouth as needed.     No current facility-administered medications on file prior to visit.   Review of Systems  Constitutional: Negative for unusual diaphoresis or night sweats HENT: Negative for ringing in ear or discharge Eyes: Negative for double vision or worsening visual disturbance.  Respiratory: Negative for choking and stridor.   Gastrointestinal: Negative for vomiting or other signifcant bowel change Genitourinary: Negative for hematuria or change in urine volume.  Musculoskeletal: Negative for other MSK pain or swelling Skin: Negative for color change and worsening wound.  Neurological: Negative for tremors and numbness other than noted  Psychiatric/Behavioral: Negative for decreased concentration or agitation other than above  ]    Objective:   Physical Exam BP 128/84 mmHg  Pulse 68  Temp(Src) 98 F (36.7 C) (Oral)  Resp 16  SpO2 95% VS noted,  Constitutional: Pt appears in no significant distress HENT: Head: NCAT.  Right Ear: External ear normal.  Left Ear: External ear normal.  Eyes: . Pupils are equal, round, and reactive to light. Conjunctivae and EOM are normal Neck: Normal range of motion. Neck supple.  Cardiovascular: Normal rate and regular rhythm.   Pulmonary/Chest: Effort normal and breath sounds without rales or wheezing.  Neurological: Pt is alert. Not confused , motor grossly intact Skin: Skin is warm. No rash, no LE edema Psychiatric: Pt behavior is normal. No agitation.     Assessment & Plan:

## 2015-05-29 NOTE — Patient Instructions (Addendum)
You had the shingles shot today  Please take all new medication as prescribed  - the lidoderm patch  You can also consider taking Capsaicin OTC topical as well  Please continue all other medications as before, and refills have been done if requested.  Please have the pharmacy call with any other refills you may need.  Please keep your appointments with your specialists as you may have planned  Please return in October 2016, or sooner if needed, with Lab testing done 3-5 days before

## 2015-05-29 NOTE — Progress Notes (Signed)
Pre visit review using our clinic review tool, if applicable. No additional management support is needed unless otherwise documented below in the visit note. 

## 2015-05-30 NOTE — Assessment & Plan Note (Signed)
stable overall by history and exam, recent data reviewed with pt, and pt to continue medical treatment as before,  to f/u any worsening symptoms or concerns Lab Results  Component Value Date   HGBA1C 5.3 07/28/2011

## 2015-05-30 NOTE — Assessment & Plan Note (Signed)
No evidence recurrent dz,  to f/u any worsening symptoms or concerns

## 2015-05-30 NOTE — Assessment & Plan Note (Signed)
Moderate, for capsiacin topical, lidoderm patch prn, no need repeat valtrex for this purpose at this time, pt does want shingles shot and has verified insurance covers as preventive

## 2015-05-30 NOTE — Assessment & Plan Note (Signed)
stable overall by history and exam, recent data reviewed with pt, and pt to continue medical treatment as before,  to f/u any worsening symptoms or concerns Lab Results  Component Value Date   TSH 2.42 07/29/2014

## 2015-06-02 ENCOUNTER — Telehealth: Payer: Self-pay | Admitting: Internal Medicine

## 2015-06-02 ENCOUNTER — Ambulatory Visit (INDEPENDENT_AMBULATORY_CARE_PROVIDER_SITE_OTHER): Payer: BLUE CROSS/BLUE SHIELD | Admitting: Internal Medicine

## 2015-06-02 ENCOUNTER — Encounter: Payer: Self-pay | Admitting: Internal Medicine

## 2015-06-02 VITALS — BP 120/76 | HR 87 | Temp 97.7°F | Ht 63.0 in | Wt 225.0 lb

## 2015-06-02 DIAGNOSIS — R062 Wheezing: Secondary | ICD-10-CM | POA: Diagnosis not present

## 2015-06-02 DIAGNOSIS — R0789 Other chest pain: Secondary | ICD-10-CM | POA: Diagnosis not present

## 2015-06-02 DIAGNOSIS — T8090XA Unspecified complication following infusion and therapeutic injection, initial encounter: Secondary | ICD-10-CM | POA: Diagnosis not present

## 2015-06-02 DIAGNOSIS — E119 Type 2 diabetes mellitus without complications: Secondary | ICD-10-CM

## 2015-06-02 MED ORDER — PREDNISONE 10 MG PO TABS: ORAL_TABLET | ORAL | Status: DC

## 2015-06-02 MED ORDER — METHYLPREDNISOLONE ACETATE 80 MG/ML IJ SUSP
80.0000 mg | Freq: Once | INTRAMUSCULAR | Status: AC
  Administered 2015-06-02: 80 mg via INTRAMUSCULAR

## 2015-06-02 NOTE — Patient Instructions (Addendum)
You had the steroid shot today  Please take all new medication as prescribed - the prednisone  Please continue all other medications as before, including your inhalers  Please have the pharmacy call with any other refills you may need.  Please keep your appointments with your specialists as you may have planned

## 2015-06-02 NOTE — Assessment & Plan Note (Signed)
stable overall by history and exam, recent data reviewed with pt, and pt to continue medical treatment as before,  to f/u any worsening symptoms or concerns Lab Results  Component Value Date   HGBA1C 5.3 07/28/2011

## 2015-06-02 NOTE — Assessment & Plan Note (Addendum)
Mild with sob, ? Asthma exac vs zostavax reaction, for depomedrol IM, predpac asd,  to f/u any worsening symptoms or concerns  ECG reviewed as per emr

## 2015-06-02 NOTE — Progress Notes (Signed)
Subjective:    Patient ID: Suzanne Hansen, female    DOB: 28-Jan-1956, 59 y.o.   MRN: [id number]  HPI  Here with c/o intermittent wheeze and chest tightness, mild with sob off and on x 3 days since the evening after the zostavax last visit. No fever, ST, prod cough , and Pt denies chest pain, orthopnea, PND, increased LE swelling, palpitations, dizziness or syncope.  Has some erythema and itching at the injection site as well.  Pt denies polydipsia, polyuria,.  Pt states overall good compliance with meds, trying to follow lower cholesterol, diabetic diet, cbg's low 100's. Past Medical History  Diagnosis Date  . Hypoactive thyroid   . Asthma   . Allergy   . HERPES SIMPLEX, UNCOMPLICATED 07/03/2682  . HYPERTHYROIDISM 05/23/2007  . HYPOTHYROIDISM 05/09/2008  . VITAMIN D DEFICIENCY 08/28/2009  . GLUCOSE INTOLERANCE 08/28/2009  . OBESITY 05/23/2007  . ANXIETY 05/23/2007  . SINUSITIS- ACUTE-NOS 11/03/2008  . URI 08/28/2009  . ALLERGIC RHINITIS 05/09/2008  . ASTHMA 05/23/2007  . UTI 04/29/2008  . ENDOMETRIOSIS NOS 05/23/2007  . CONTACT DERMATITIS 06/09/2009  . UNSPECIFIED URTICARIA 06/04/2009  . SHOULDER PAIN, LEFT 05/17/2010  . LATERAL EPICONDYLITIS, RIGHT 03/10/2009  . ARM PAIN, LEFT 10/23/2009  . FACIAL PAIN 06/02/2010  . Wheezing 07/12/2010  . Unspecified Chest Pain 07/25/2008  . FREQUENCY, URINARY 05/17/2010  . INSOMNIA, HX OF 05/23/2007  . LIPOMA 10/22/2010  . SKIN LESION 11/10/2010  . NECK PAIN 11/10/2010  . Impaired glucose tolerance 01/28/2011  . Diabetes mellitus   . Plantar fasciitis     Both feet   Past Surgical History  Procedure Laterality Date  . Endometrial ablation    . Wisdom tooth extraction    . Uterine fibroid surgery    . Laminectomy  1996  . Laparotomy      exploratory  . S/p endometrial ablation  12/06  . Tubal ligation      reports that she quit smoking about 27 years ago. Her smoking use included Cigarettes. She smoked 0.50 packs per day. She has never used smokeless  tobacco. She reports that she does not drink alcohol or use illicit drugs. family history includes COPD in her father; Cancer in her maternal grandmother; Diabetes in her cousin, mother, and sister; Heart disease in her cousin and mother; Hypertension in her cousin and father; Kidney disease in her sister. Allergies  Allergen Reactions  . Ivp Dye [Iodinated Diagnostic Agents] Shortness Of Breath and Other (See Comments)    Swelling of extremity of the injection site  . Latex Itching    ITCHING AND COLD SORES AROUND MOUTH WHEN LATEX GLOVES USED BY THE DENTIST/HYGENTIST  . Clarithromycin     REACTION: nausea  vomiting  . Metronidazole Nausea And Vomiting   Current Outpatient Prescriptions on File Prior to Visit  Medication Sig Dispense Refill  . albuterol (PROAIR HFA) 108 (90 BASE) MCG/ACT inhaler Inhale 2 puffs into the lungs every 4 (four) hours as needed for wheezing or shortness of breath. 18 each 2  . aspirin 81 MG EC tablet Take 81 mg by mouth daily.      . budesonide-formoterol (SYMBICORT) 160-4.5 MCG/ACT inhaler Inhale 2 puffs into the lungs 2 (two) times daily. 10.2 Inhaler 2  . Calcium Citrate-Vitamin D (CITRACAL PETITES/VITAMIN D PO) Take 1 tablet by mouth daily.    . cyclobenzaprine (FLEXERIL) 10 MG tablet Take 0.5-1 tablets (5-10 mg total) by mouth 3 (three) times daily as needed for muscle spasms (sedation caution). 30 tablet  0  . estradiol-norethindrone (MIMVEY) 1-0.5 MG per tablet Take 1 tablet by mouth daily.    . fexofenadine (ALLEGRA) 180 MG tablet Take 180 mg by mouth daily.      . fluconazole (DIFLUCAN) 150 MG tablet 1 tab every 3 days as needed 2 tablet 1  . levothyroxine (SYNTHROID, LEVOTHROID) 112 MCG tablet Take 112 mcg by mouth daily.    [name] lidocaine (LIDODERM) 5 % Place 1 patch onto the skin daily. Remove & Discard patch within 12 hours or as directed by MD 60 patch 2  . metFORMIN (GLUCOPHAGE) 500 MG tablet Take 1,000 mg by mouth daily.     . mometasone (NASONEX) 50  MCG/ACT nasal spray Place 2 sprays into the nose daily. 17 g 11  . Multiple Vitamin (MULTIVITAMIN) capsule Take 1 capsule by mouth daily.      [name] tolterodine (DETROL LA) 2 MG 24 hr capsule Take 2 mg by mouth as needed.     No current facility-administered medications on file prior to visit.     Review of Systems  Constitutional: Negative for unusual diaphoresis or night sweats HENT: Negative for ringing in ear or discharge Eyes: Negative for double vision or worsening visual disturbance.  Respiratory: Negative for choking and stridor.   Gastrointestinal: Negative for vomiting or other signifcant bowel change Genitourinary: Negative for hematuria or change in urine volume.  Musculoskeletal: Negative for other MSK pain or swelling Skin: Negative for color change and worsening wound.  Neurological: Negative for tremors and numbness other than noted  Psychiatric/Behavioral: Negative for decreased concentration or agitation other than above       Objective:   Physical Exam BP 120/76 mmHg  Pulse 87  Temp(Src) 97.7 F (36.5 C) (Oral)  Ht 5\' 3"  (1.6 m)  Wt 225 lb (102.059 kg)  BMI 39.87 kg/m2  SpO2 98% VS noted,  Constitutional: Pt appears in no significant distress HENT: Head: NCAT.  Right Ear: External ear normal.  Left Ear: External ear normal.  Eyes: . Pupils are equal, round, and reactive to light. Conjunctivae and EOM are normal Neck: Normal range of motion. Neck supple.  Cardiovascular: Normal rate and regular rhythm.   Pulmonary/Chest: Effort normal and breath sounds mild decreased without rales but with trace wheezing bilat Neurological: Pt is alert. Not confused , motor grossly intact Skin: Skin is warm. No rash, no LE edema except for 3 cm area nontender erythema right mid post upper arm at zostavax injection site, slight raised/edematous Psychiatric: Pt behavior is normal. No agitation.     Assessment & Plan:

## 2015-06-02 NOTE — Progress Notes (Signed)
Pre visit review using our clinic review tool, if applicable. No additional management support is needed unless otherwise documented below in the visit note. 

## 2015-06-02 NOTE — Assessment & Plan Note (Signed)
Appears irritative vs allergic, should respond to depomedrol/predpac asd, also ok for cortisone otc prn

## 2015-06-02 NOTE — Telephone Encounter (Signed)
OB: 09-Jul-1956 Initial Comment Caller states she got a Shingles shot on Friday. She got really tired after the injection. Headache up until yesterday. Her arm has swelling and a red patch at the injection site. She has been putting alcohol on it, because her arm has some swelling. It's itching. Nurse Assessment Nurse: Vallery Sa, RN, Cathy Date/Time (Eastern Time): 06/02/2015 11:52:29 AM Confirm and document reason for call. If symptomatic, describe symptoms. ---Caller states she had the Shingles immunization 4 days ago and she developed redness/swelling 2 days ago that is still present today. No fever. Has the patient traveled out of the country within the last 30 days? ---No Does the patient require triage? ---Yes Related visit to physician within the last 2 weeks? ---Yes Does the PT have any chronic conditions? (i.e. diabetes, asthma, etc.) ---Yes List chronic conditions. ---Hypothyroidism, Asthma (no wheezing), Diabetes, Shellfish Allergy, Seasonal allergies Guidelines Guideline Title Affirmed Question Affirmed Notes Immunization Reactions [1] Redness or red streak around the injection site AND [2] begins > 48 hours after shot AND [3] no fever (Exception: red area < 1 inch or 2.5 cm wide) Chest Pain [1] Chest pain lasting <= 5 minutes AND [2] NO chest pain or cardiac symptoms now (Exceptions: pains lasting a few seconds) Final Disposition User See Physician within [place], RN, Hydrographic surveyor states she had chest pain 3 days ago that she rated as a 4 on the 1 to 10 scale. Scheduled for 6pm appointment tonight with Dr. Jenny Reichmann. Referrals REFERRED TO PCP OFFICE Disagree/Comply: Comply

## 2015-06-16 ENCOUNTER — Other Ambulatory Visit: Payer: Self-pay | Admitting: Obstetrics and Gynecology

## 2015-06-17 LAB — CYTOLOGY - PAP

## 2015-07-03 ENCOUNTER — Other Ambulatory Visit: Payer: Self-pay | Admitting: Obstetrics and Gynecology

## 2015-08-13 ENCOUNTER — Ambulatory Visit (INDEPENDENT_AMBULATORY_CARE_PROVIDER_SITE_OTHER): Payer: BLUE CROSS/BLUE SHIELD | Admitting: Internal Medicine

## 2015-08-13 ENCOUNTER — Encounter: Payer: Self-pay | Admitting: Internal Medicine

## 2015-08-13 VITALS — BP 128/84 | HR 78 | Temp 98.6°F | Ht 65.0 in | Wt 231.0 lb

## 2015-08-13 DIAGNOSIS — J4520 Mild intermittent asthma, uncomplicated: Secondary | ICD-10-CM

## 2015-08-13 DIAGNOSIS — E119 Type 2 diabetes mellitus without complications: Secondary | ICD-10-CM | POA: Diagnosis not present

## 2015-08-13 DIAGNOSIS — R609 Edema, unspecified: Secondary | ICD-10-CM | POA: Diagnosis not present

## 2015-08-13 DIAGNOSIS — J069 Acute upper respiratory infection, unspecified: Secondary | ICD-10-CM | POA: Diagnosis not present

## 2015-08-13 MED ORDER — HYDROCODONE-HOMATROPINE 5-1.5 MG/5ML PO SYRP: 5.0000 mL | ORAL_SOLUTION | Freq: Four times a day (QID) | ORAL | Status: DC | PRN

## 2015-08-13 MED ORDER — HYDROCHLOROTHIAZIDE 12.5 MG PO CAPS: 12.5000 mg | ORAL_CAPSULE | Freq: Every day | ORAL | Status: DC

## 2015-08-13 MED ORDER — LEVOFLOXACIN 500 MG PO TABS: 500.0000 mg | ORAL_TABLET | Freq: Every day | ORAL | Status: DC

## 2015-08-13 NOTE — Progress Notes (Signed)
Pre visit review using our clinic review tool, if applicable. No additional management support is needed unless otherwise documented below in the visit note. 

## 2015-08-13 NOTE — Patient Instructions (Addendum)
Please take all new medication as prescribed - the antiibiotic, cough medicine as needed, and the mild fluid pill  Please continue all other medications as before, and refills have been done if requested.  Please have the pharmacy call with any other refills you may need.  Please continue your efforts at being more active, low cholesterol diet, and weight control.  Please keep your appointments with your specialists as you may have planned - Dr Suzette Battiest for endo

## 2015-08-13 NOTE — Progress Notes (Signed)
Subjective:    Patient ID: Suzanne Hansen, female    DOB: 04-06-1956, 59 y.o.   MRN: [id number]  HPI Here with acute onset mild to mod 2-3 days ST, HA, general weakness and malaise, with prod cough greenish sputum, but Pt denies chest pain, increased sob or doe, wheezing, orthopnea, PND, palpitations, dizziness or syncope, but has had mild worsening LE edema. Admits to some what sounds like polydipsia?. Pt denies new neurological symptoms such as new headache, or facial or extremity weakness or numbness   Pt denies polydipsia, polyuria, or low sugar symptoms such as weakness or confusion improved with po intake.  Pt states overall good compliance with meds, trying to follow lower cholesterol, diabetic diet, wt overall stable but little exercise however.   Now on januvia as did not tolerate the invoaka Past Medical History  Diagnosis Date  . Hypoactive thyroid   . Asthma   . Allergy   . HERPES SIMPLEX, UNCOMPLICATED 5/32/9924  . HYPERTHYROIDISM 05/23/2007  . HYPOTHYROIDISM 05/09/2008  . VITAMIN D DEFICIENCY 08/28/2009  . GLUCOSE INTOLERANCE 08/28/2009  . OBESITY 05/23/2007  . ANXIETY 05/23/2007  . SINUSITIS- ACUTE-NOS 11/03/2008  . URI 08/28/2009  . ALLERGIC RHINITIS 05/09/2008  . ASTHMA 05/23/2007  . UTI 04/29/2008  . ENDOMETRIOSIS NOS 05/23/2007  . CONTACT DERMATITIS 06/09/2009  . UNSPECIFIED URTICARIA 06/04/2009  . SHOULDER PAIN, LEFT 05/17/2010  . LATERAL EPICONDYLITIS, RIGHT 03/10/2009  . ARM PAIN, LEFT 10/23/2009  . FACIAL PAIN 06/02/2010  . Wheezing 07/12/2010  . Unspecified Chest Pain 07/25/2008  . FREQUENCY, URINARY 05/17/2010  . INSOMNIA, HX OF 05/23/2007  . LIPOMA 10/22/2010  . SKIN LESION 11/10/2010  . NECK PAIN 11/10/2010  . Impaired glucose tolerance 01/28/2011  . Diabetes mellitus   . Plantar fasciitis     Both feet   Past Surgical History  Procedure Laterality Date  . Endometrial ablation    . Wisdom tooth extraction    . Uterine fibroid surgery    . Laminectomy  1996  .  Laparotomy      exploratory  . S/p endometrial ablation  12/06  . Tubal ligation      reports that she quit smoking about 27 years ago. Her smoking use included Cigarettes. She smoked 0.50 packs per day. She has never used smokeless tobacco. She reports that she does not drink alcohol or use illicit drugs. family history includes COPD in her father; Cancer in her maternal grandmother; Diabetes in her cousin, mother, and sister; Heart disease in her cousin and mother; Hypertension in her cousin and father; Kidney disease in her sister. Allergies  Allergen Reactions  . Ivp Dye [Iodinated Diagnostic Agents] Shortness Of Breath and Other (See Comments)    Swelling of extremity of the injection site  . Latex Itching    ITCHING AND COLD SORES AROUND MOUTH WHEN LATEX GLOVES USED BY THE DENTIST/HYGENTIST  . Clarithromycin     REACTION: nausea  vomiting  . Metronidazole Nausea And Vomiting   Current Outpatient Prescriptions on File Prior to Visit  Medication Sig Dispense Refill  . albuterol (PROAIR HFA) 108 (90 BASE) MCG/ACT inhaler Inhale 2 puffs into the lungs every 4 (four) hours as needed for wheezing or shortness of breath. 18 each 2  . aspirin 81 MG EC tablet Take 81 mg by mouth daily.      . budesonide-formoterol (SYMBICORT) 160-4.5 MCG/ACT inhaler Inhale 2 puffs into the lungs 2 (two) times daily. 10.2 Inhaler 2  . Calcium Citrate-Vitamin D (CITRACAL PETITES/VITAMIN D  PO) Take 1 tablet by mouth daily.    . cyclobenzaprine (FLEXERIL) 10 MG tablet Take 0.5-1 tablets (5-10 mg total) by mouth 3 (three) times daily as needed for muscle spasms (sedation caution). 30 tablet 0  . estradiol-norethindrone (MIMVEY) 1-0.5 MG per tablet Take 1 tablet by mouth daily.    . fexofenadine (ALLEGRA) 180 MG tablet Take 180 mg by mouth daily.      [name] levothyroxine (SYNTHROID, LEVOTHROID) 112 MCG tablet Take 100 mcg by mouth daily.     [name] lidocaine (LIDODERM) 5 % Place 1 patch onto the skin daily. Remove & Discard  patch within 12 hours or as directed by MD 60 patch 2  . metFORMIN (GLUCOPHAGE) 500 MG tablet Take 1,000 mg by mouth daily.     . mometasone (NASONEX) 50 MCG/ACT nasal spray Place 2 sprays into the nose daily. 17 g 11  . Multiple Vitamin (MULTIVITAMIN) capsule Take 1 capsule by mouth daily.      [name] tolterodine (DETROL LA) 2 MG 24 hr capsule Take 2 mg by mouth as needed.    . fluconazole (DIFLUCAN) 150 MG tablet 1 tab every 3 days as needed (Patient not taking: Reported on 08/13/2015) 2 tablet 1  . predniSONE (DELTASONE) 10 MG tablet 3 tabs by mouth per day for 3 days,2tabs per day for 3 days,1tab per day for 3 days (Patient not taking: Reported on 08/13/2015) 18 tablet 0   No current facility-administered medications on file prior to visit.   Review of Systems  Constitutional: Negative for unusual diaphoresis or night sweats HENT: Negative for ringing in ear or discharge Eyes: Negative for double vision or worsening visual disturbance.  Respiratory: Negative for choking and stridor.   Gastrointestinal: Negative for vomiting or other signifcant bowel change Genitourinary: Negative for hematuria or change in urine volume.  Musculoskeletal: Negative for other MSK pain or swelling Skin: Negative for color change and worsening wound.  Neurological: Negative for tremors and numbness other than noted  Psychiatric/Behavioral: Negative for decreased concentration or agitation other than above       Objective:   Physical Exam BP 128/84 mmHg  Pulse 78  Temp(Src) 98.6 F (37 C) (Oral)  Ht 5\' 5"  (1.651 m)  Wt 231 lb (104.781 kg)  BMI 38.44 kg/m2  SpO2 96% VS noted,  Constitutional: Pt appears in no significant distress HENT: Head: NCAT.  Right Ear: External ear normal.  Left Ear: External ear normal.  Eyes: . Pupils are equal, round, and reactive to light. Conjunctivae and EOM are normal Bilat tm's with mild erythema.  Max sinus areas mild tender.  Pharynx with mild erythema, no exudate Neck:  Normal range of motion. Neck supple.  Cardiovascular: Normal rate and regular rhythm.   Pulmonary/Chest: Effort normal and breath sounds without rales or wheezing.  Neurological: Pt is alert. Not confused , motor grossly intact Skin: Skin is warm. No rash, trace to 1+ bilat LE edema Psychiatric: Pt behavior is normal. No agitation.     Assessment & Plan:

## 2015-08-13 NOTE — Assessment & Plan Note (Signed)
stable overall by history and exam, recent data reviewed with pt, and pt to continue medical treatment as before,  to f/u any worsening symptoms or concerns SpO2 Readings from Last 3 Encounters:  08/13/15 96%  06/02/15 98%  05/29/15 95%

## 2015-08-13 NOTE — Assessment & Plan Note (Signed)
stable overall by history and exam, recent data reviewed with pt, and pt to continue medical treatment as before,  to f/u any worsening symptoms or concerns Lab Results  Component Value Date   HGBA1C 5.3 07/28/2011   For f/u endo as planned

## 2015-08-13 NOTE — Assessment & Plan Note (Addendum)
[place] for hct 12.5 qd prn, avoid polydipsia, /fu labs next visit

## 2015-08-13 NOTE — Assessment & Plan Note (Signed)
Mild to mod, for antibx course,  to f/u any worsening symptoms or concerns, for cough med prn 

## 2015-08-19 ENCOUNTER — Other Ambulatory Visit (INDEPENDENT_AMBULATORY_CARE_PROVIDER_SITE_OTHER): Payer: BLUE CROSS/BLUE SHIELD

## 2015-08-19 DIAGNOSIS — E119 Type 2 diabetes mellitus without complications: Secondary | ICD-10-CM

## 2015-08-19 DIAGNOSIS — Z0189 Encounter for other specified special examinations: Secondary | ICD-10-CM

## 2015-08-19 DIAGNOSIS — Z0000 Encounter for general adult medical examination without abnormal findings: Secondary | ICD-10-CM

## 2015-08-19 LAB — HEPATIC FUNCTION PANEL
ALK PHOS: 65 U/L (ref 39–117)
ALT: 18 U/L (ref 0–35)
AST: 19 U/L (ref 0–37)
Albumin: 4.1 g/dL (ref 3.5–5.2)
BILIRUBIN DIRECT: 0.1 mg/dL (ref 0.0–0.3)
BILIRUBIN TOTAL: 0.6 mg/dL (ref 0.2–1.2)
TOTAL PROTEIN: 7.2 g/dL (ref 6.0–8.3)

## 2015-08-19 LAB — URINALYSIS, ROUTINE W REFLEX MICROSCOPIC
Bilirubin Urine: NEGATIVE
Ketones, ur: NEGATIVE
Leukocytes, UA: NEGATIVE
Nitrite: NEGATIVE
Specific Gravity, Urine: <=1.005 — AB (ref 1.000–1.030)
TOTAL PROTEIN, URINE-UPE24: NEGATIVE
Urine Glucose: NEGATIVE
Urobilinogen, UA: 0.2 (ref 0.0–1.0)
pH: 6 (ref 5.0–8.0)

## 2015-08-19 LAB — MICROALBUMIN / CREATININE URINE RATIO
Creatinine,U: 48.5 mg/dL
MICROALB/CREAT RATIO: 1.4 mg/g (ref 0.0–30.0)

## 2015-08-19 LAB — LIPID PANEL
CHOLESTEROL: 182 mg/dL (ref 0–200)
HDL: 73.5 mg/dL (ref >39.00)
LDL CALC: 98 mg/dL (ref 0–99)
NonHDL: 108.73
Total CHOL/HDL Ratio: 2
Triglycerides: 53 mg/dL (ref 0.0–149.0)
VLDL: 10.6 mg/dL (ref 0.0–40.0)

## 2015-08-19 LAB — CBC WITH DIFFERENTIAL/PLATELET
Basophils Absolute: 0.1 10*3/uL (ref 0.0–0.1)
Basophils Relative: 0.7 % (ref 0.0–3.0)
Eosinophils Absolute: 0.1 10*3/uL (ref 0.0–0.7)
Eosinophils Relative: 0.9 % (ref 0.0–5.0)
HEMATOCRIT: 44.4 % (ref 36.0–46.0)
Hemoglobin: 15 g/dL (ref 12.0–15.0)
LYMPHS ABS: 2.7 10*3/uL (ref 0.7–4.0)
Lymphocytes Relative: 28.7 % (ref 12.0–46.0)
MCHC: 33.8 g/dL (ref 30.0–36.0)
MCV: 90.1 fl (ref 78.0–100.0)
MONO ABS: 0.7 10*3/uL (ref 0.1–1.0)
Monocytes Relative: 7.7 % (ref 3.0–12.0)
NEUTROS PCT: 62 % (ref 43.0–77.0)
Neutro Abs: 5.8 10*3/uL (ref 1.4–7.7)
PLATELETS: 255 10*3/uL (ref 150.0–400.0)
RBC: 4.93 Mil/uL (ref 3.87–5.11)
RDW: 13.9 % (ref 11.5–15.5)
WBC: 9.3 10*3/uL (ref 4.0–10.5)

## 2015-08-19 LAB — BASIC METABOLIC PANEL
BUN: 16 mg/dL (ref 6–23)
CALCIUM: 9.9 mg/dL (ref 8.4–10.5)
CO2: 26 meq/L (ref 19–32)
Chloride: 101 mEq/L (ref 96–112)
Creatinine, Ser: 0.97 mg/dL (ref 0.40–1.20)
GFR: 75.76 mL/min (ref >60.00)
Glucose, Bld: 91 mg/dL (ref 70–99)
Potassium: 3.9 mEq/L (ref 3.5–5.1)
SODIUM: 137 meq/L (ref 135–145)

## 2015-08-19 LAB — TSH: TSH: 3.25 u[IU]/mL (ref 0.35–4.50)

## 2015-08-19 LAB — HEMOGLOBIN A1C: HEMOGLOBIN A1C: 5.4 % (ref 4.6–6.5)

## 2015-08-28 ENCOUNTER — Ambulatory Visit (INDEPENDENT_AMBULATORY_CARE_PROVIDER_SITE_OTHER): Payer: BLUE CROSS/BLUE SHIELD | Admitting: Internal Medicine

## 2015-08-28 ENCOUNTER — Ambulatory Visit (INDEPENDENT_AMBULATORY_CARE_PROVIDER_SITE_OTHER)
Admission: RE | Admit: 2015-08-28 | Discharge: 2015-08-28 | Disposition: A | Discharge status: Home / Self Care | Payer: BLUE CROSS/BLUE SHIELD | Source: Ambulatory Visit | Attending: Internal Medicine | Admitting: Internal Medicine

## 2015-08-28 ENCOUNTER — Encounter: Payer: Self-pay | Admitting: Internal Medicine

## 2015-08-28 VITALS — BP 130/90 | HR 77 | Temp 97.7°F | Ht 65.0 in | Wt 230.0 lb

## 2015-08-28 DIAGNOSIS — I83819 Varicose veins of unspecified lower extremities with pain: Secondary | ICD-10-CM | POA: Insufficient documentation

## 2015-08-28 DIAGNOSIS — I83813 Varicose veins of bilateral lower extremities with pain: Secondary | ICD-10-CM

## 2015-08-28 DIAGNOSIS — E785 Hyperlipidemia, unspecified: Secondary | ICD-10-CM

## 2015-08-28 DIAGNOSIS — M25562 Pain in left knee: Secondary | ICD-10-CM

## 2015-08-28 DIAGNOSIS — R609 Edema, unspecified: Secondary | ICD-10-CM

## 2015-08-28 DIAGNOSIS — E119 Type 2 diabetes mellitus without complications: Secondary | ICD-10-CM

## 2015-08-28 DIAGNOSIS — Z23 Encounter for immunization: Secondary | ICD-10-CM | POA: Diagnosis not present

## 2015-08-28 DIAGNOSIS — Z0000 Encounter for general adult medical examination without abnormal findings: Secondary | ICD-10-CM

## 2015-08-28 HISTORY — DX: Hyperlipidemia, unspecified: E78.5

## 2015-08-28 MED ORDER — HYDROCHLOROTHIAZIDE 25 MG PO TABS: 25.0000 mg | ORAL_TABLET | Freq: Every day | ORAL | Status: DC

## 2015-08-28 MED ORDER — ROSUVASTATIN CALCIUM 10 MG PO TABS: 10.0000 mg | ORAL_TABLET | Freq: Every day | ORAL | Status: DC

## 2015-08-28 NOTE — Patient Instructions (Addendum)
You had the flu shot today  Please take all new medication as prescribed  - the crestor 10 mg per day  OK to increase the HCT fluid pill to 25 mg per day  Please continue all other medications as before, and refills have been done if requested.  Please have the pharmacy call with any other refills you may need.  Please continue your efforts at being more active, low cholesterol diabetic diet, and weight control.  Please go to the XRAY Department in the Basement (go straight as you get off the elevator) for the x-ray testing  You will be contacted regarding the referral for: echocardiogram, Dr Tamala Julian for the left knee, and the [facility] for the veins  You are otherwise up to date with prevention measures today.  Please keep your appointments with your specialists as you may have planned  Please return in 1 year for your yearly visit, or sooner if needed, with Lab testing done 3-5 days before

## 2015-08-28 NOTE — Assessment & Plan Note (Signed)

## 2015-08-28 NOTE — Progress Notes (Signed)
Pre visit review using our clinic review tool, if applicable. No additional management support is needed unless otherwise documented below in the visit note. 

## 2015-08-28 NOTE — Progress Notes (Signed)
Subjective:    Patient ID: Suzanne Hansen, female    DOB: Feb 17, 1956, 59 y.o.   MRN: [id number]  HPI  Here for wellness and f/u;  Overall doing ok;  Pt denies Chest pain, worsening SOB, DOE, wheezing, orthopnea, PND, palpitations, dizziness or syncope, but has worsening LE edema despite good hct 12.5 compliance..  Pt denies neurological change such as new headache, facial or extremity weakness.  Pt denies polydipsia, polyuria, or low sugar symptoms. Pt states overall good compliance with treatment and medications, good tolerability, and has been trying to follow appropriate diet.  Pt denies worsening depressive symptoms, suicidal ideation or panic. No fever, night sweats, wt loss, loss of appetite, or other constitutional symptoms.  Pt states good ability with ADL's, has low fall risk, home safety reviewed and adequate, no other significant changes in hearing or vision, and only occasionally active with exercise.  Has been seeing endo, has f/u nov 7, did not tolerate januvia due to wt gain or invokana due to chest pain, also off metformin due to wt gain.  Has seen nutrition and a1c improved.  CBG's at home in the 92-114.  C/o persistent mild edema on the diuretic hct 12.5. Tried large compression stockings but was difficult, and mde her varicose veins hurt, considering trying the extra large. Has hx of HPV, and several neg biopsies for malignancy, rec'd for conization but has not done this, still considering.[name]  Has several leg varicositis with pain  Also with worsening left knee pain, swelling that limits her mobility, not clear if her increased wt is a factor, has swelling off and on Wt Readings from Last 3 Encounters:  08/28/15 230 lb (104.327 kg)  08/13/15 231 lb (104.781 kg)  06/02/15 225 lb (102.059 kg)     Past Medical History  Diagnosis Date  . Hypoactive thyroid   . Asthma   . Allergy   . HERPES SIMPLEX, UNCOMPLICATED [id number]  . HYPERTHYROIDISM 05/23/2007  . HYPOTHYROIDISM 05/09/2008  .  VITAMIN D DEFICIENCY 08/28/2009  . GLUCOSE INTOLERANCE 08/28/2009  . OBESITY 05/23/2007  . ANXIETY 05/23/2007  . SINUSITIS- ACUTE-NOS 11/03/2008  . URI 08/28/2009  . ALLERGIC RHINITIS 05/09/2008  . ASTHMA 05/23/2007  . UTI 04/29/2008  . ENDOMETRIOSIS NOS 05/23/2007  . CONTACT DERMATITIS 06/09/2009  . UNSPECIFIED URTICARIA 06/04/2009  . SHOULDER PAIN, LEFT 05/17/2010  . LATERAL EPICONDYLITIS, RIGHT 03/10/2009  . ARM PAIN, LEFT 10/23/2009  . FACIAL PAIN 06/02/2010  . Wheezing 07/12/2010  . Unspecified Chest Pain 07/25/2008  . FREQUENCY, URINARY 05/17/2010  . INSOMNIA, HX OF 05/23/2007  . LIPOMA 10/22/2010  . SKIN LESION 11/10/2010  . NECK PAIN 11/10/2010  . Impaired glucose tolerance 01/28/2011  . Diabetes mellitus   . Plantar fasciitis     Both feet   Past Surgical History  Procedure Laterality Date  . Endometrial ablation    . Wisdom tooth extraction    . Uterine fibroid surgery    . Laminectomy  1996  . Laparotomy      exploratory  . S/p endometrial ablation  12/06  . Tubal ligation      reports that she quit smoking about 27 years ago. Her smoking use included Cigarettes. She smoked 0.50 packs per day. She has never used smokeless tobacco. She reports that she does not drink alcohol or use illicit drugs. family history includes COPD in her father; Cancer in her maternal grandmother; Diabetes in her cousin, mother, and sister; Heart disease in her cousin and mother; Hypertension in her  cousin and father; Kidney disease in her sister. Allergies  Allergen Reactions  . Ivp Dye [Iodinated Diagnostic Agents] Shortness Of Breath and Other (See Comments)    Swelling of extremity of the injection site  . Latex Itching    ITCHING AND COLD SORES AROUND MOUTH WHEN LATEX GLOVES USED BY THE DENTIST/HYGENTIST  . Clarithromycin     REACTION: nausea  vomiting  . Metronidazole Nausea And Vomiting   Current Outpatient Prescriptions on File Prior to Visit  Medication Sig Dispense Refill  . albuterol  (PROAIR HFA) 108 (90 BASE) MCG/ACT inhaler Inhale 2 puffs into the lungs every 4 (four) hours as needed for wheezing or shortness of breath. 18 each 2  . aspirin 81 MG EC tablet Take 81 mg by mouth daily.      . budesonide-formoterol (SYMBICORT) 160-4.5 MCG/ACT inhaler Inhale 2 puffs into the lungs 2 (two) times daily. 10.2 Inhaler 2  . Calcium Citrate-Vitamin D (CITRACAL PETITES/VITAMIN D PO) Take 1 tablet by mouth daily.    [name] estradiol-norethindrone (MIMVEY) 1-0.5 MG per tablet Take 1 tablet by mouth daily.    . fexofenadine (ALLEGRA) 180 MG tablet Take 180 mg by mouth daily.      . hydrochlorothiazide (MICROZIDE) 12.5 MG capsule Take 1 capsule (12.5 mg total) by mouth daily. 90 capsule 3  . levothyroxine (SYNTHROID, LEVOTHROID) 112 MCG tablet Take 100 mcg by mouth daily.     . mometasone (NASONEX) 50 MCG/ACT nasal spray Place 2 sprays into the nose daily. 17 g 11  . Multiple Vitamin (MULTIVITAMIN) capsule Take 1 capsule by mouth daily.      . cyclobenzaprine (FLEXERIL) 10 MG tablet Take 0.5-1 tablets (5-10 mg total) by mouth 3 (three) times daily as needed for muscle spasms (sedation caution). (Patient not taking: Reported on 08/28/2015) 30 tablet 0  . fluconazole (DIFLUCAN) 150 MG tablet 1 tab every 3 days as needed (Patient not taking: Reported on 08/13/2015) 2 tablet 1  . HYDROcodone-homatropine (HYCODAN) 5-1.5 MG/5ML syrup Take 5 mLs by mouth every 6 (six) hours as needed for cough. (Patient not taking: Reported on 08/28/2015) 180 mL 0  . JANUVIA 100 MG tablet TAKE 1 TABLET ONCE A DAY ORALLY 90 DAYS  1  . levofloxacin (LEVAQUIN) 500 MG tablet Take 1 tablet (500 mg total) by mouth daily. (Patient not taking: Reported on 08/28/2015) 10 tablet 0  . lidocaine (LIDODERM) 5 % Place 1 patch onto the skin daily. Remove & Discard patch within 12 hours or as directed by MD (Patient not taking: Reported on 08/28/2015) 60 patch 2  . tolterodine (DETROL LA) 2 MG 24 hr capsule Take 2 mg by mouth as needed.      No current facility-administered medications on file prior to visit.   Review of Systems Constitutional: Negative for increased diaphoresis, other activity, appetite or siginficant weight change other than noted HENT: Negative for worsening hearing loss, ear pain, facial swelling, mouth sores and neck stiffness.   Eyes: Negative for other worsening pain, redness or visual disturbance.  Respiratory: Negative for shortness of breath and wheezing  Cardiovascular: Negative for chest pain and palpitations.  Gastrointestinal: Negative for diarrhea, blood in stool, abdominal distention or other pain Genitourinary: Negative for hematuria, flank pain or change in urine volume.  Musculoskeletal: Negative for myalgias or other joint complaints.  Skin: Negative for color change and wound or drainage.  Neurological: Negative for syncope and numbness. other than noted Hematological: Negative for adenopathy. or other swelling Psychiatric/Behavioral: Negative for hallucinations, SI,  self-injury, decreased concentration or other worsening agitation.      Objective:   Physical Exam BP 130/90 mmHg  Pulse 77  Temp(Src) 97.7 F (36.5 C) (Oral)  Ht 5\' 5"  (1.651 m)  Wt 230 lb (104.327 kg)  BMI 38.27 kg/m2  SpO2 98% VS noted,  Constitutional: Pt is oriented to person, place, and time. Appears well-developed and well-nourished, in no significant distress Head: Normocephalic and atraumatic.  Right Ear: External ear normal.  Left Ear: External ear normal.  Nose: Nose normal.  Mouth/Throat: Oropharynx is clear and moist.  Eyes: Conjunctivae and EOM are normal. Pupils are equal, round, and reactive to light.  Neck: Normal range of motion. Neck supple. No JVD present. No tracheal deviation present or significant neck LA or mass Cardiovascular: Normal rate, regular rhythm, normal heart sounds and intact distal pulses.   Pulmonary/Chest: Effort normal and breath sounds without rales or wheezing  Abdominal:  Soft. Bowel sounds are normal. NT. No HSM  Musculoskeletal: Normal range of motion. Exhibits trace to 1+ bilat edema left > right  Lymphadenopathy:  Has no cervical adenopathy.  Neurological: Pt is alert and oriented to person, place, and time. Pt has normal reflexes. No cranial nerve deficit. Motor grossly intact Skin: Skin is warm and dry. No rash noted. + tender varicosities Psychiatric:  Has normal mood and affect. Behavior is normal.  Left knee with mild crepitus, small effusion, FROM, NT    Assessment & Plan:

## 2015-08-29 NOTE — Assessment & Plan Note (Addendum)
Possibly venous insufficiency, cant r/o diast dysfxn - for echo, cxr, Hct increased to 25 mg daily

## 2015-08-29 NOTE — Assessment & Plan Note (Signed)
Pt symptomatic, ok for referral to vascular clinic

## 2015-08-29 NOTE — Assessment & Plan Note (Signed)
Suspect DJD flare, for referral Dr Smith/sport med

## 2015-08-29 NOTE — Assessment & Plan Note (Signed)
Mild to mod elevated, o/w stable overall by history and exam, recent data reviewed with pt, and pt to continue medical treatment as before,  to f/u any worsening symptoms or concerns, for crestor 10 qd,  to f/u any worsening symptoms or concerns, f/u labs next visit Lab Results  Component Value Date   LDLCALC 98 08/19/2015

## 2015-08-31 ENCOUNTER — Ambulatory Visit (INDEPENDENT_AMBULATORY_CARE_PROVIDER_SITE_OTHER): Payer: BLUE CROSS/BLUE SHIELD | Admitting: Family Medicine

## 2015-08-31 ENCOUNTER — Encounter: Payer: Self-pay | Admitting: Family Medicine

## 2015-08-31 ENCOUNTER — Other Ambulatory Visit (INDEPENDENT_AMBULATORY_CARE_PROVIDER_SITE_OTHER): Payer: BLUE CROSS/BLUE SHIELD

## 2015-08-31 VITALS — BP 126/82 | HR 76 | Wt 227.0 lb

## 2015-08-31 DIAGNOSIS — R609 Edema, unspecified: Secondary | ICD-10-CM | POA: Diagnosis not present

## 2015-08-31 DIAGNOSIS — M79605 Pain in left leg: Secondary | ICD-10-CM | POA: Diagnosis not present

## 2015-08-31 DIAGNOSIS — M25562 Pain in left knee: Secondary | ICD-10-CM | POA: Diagnosis not present

## 2015-08-31 DIAGNOSIS — M79661 Pain in right lower leg: Secondary | ICD-10-CM | POA: Diagnosis not present

## 2015-08-31 DIAGNOSIS — M79662 Pain in left lower leg: Secondary | ICD-10-CM

## 2015-08-31 MED ORDER — FUROSEMIDE 20 MG PO TABS: 20.0000 mg | ORAL_TABLET | Freq: Every day | ORAL | Status: DC

## 2015-08-31 NOTE — Progress Notes (Signed)
[name] [address] [place], [place] [address] Phone: [phone] Subjective:    I'm seeing this patient by the request  of:  Cathlean Cower, MD   CC: Bilateral knee pain  [id number] Suzanne Hansen is a 59 y.o. female coming in with complaint of bilateral knee pain. Patient states that she's had this pain for quite some time. Seems to be last month here. Having worsening swelling of the legs bilaterally. Has had an increase her blood pressure medication which is made some mild improvement. Continues to have known discomfort usually at the end of the day. Can have some spasming as well as cramping at night mostly of the calf muscles bilaterally. States that the left knee seems to be worse in the right knee. Not giving out on her but came cause her to change her regimen. Denies any locking or giving out on her. Would not consider any numbness. No associated back pain. Rates the severity of pain though when it happens as 9 out of 10. Has not notice any significant association with any of the medications.     Past Medical History  Diagnosis Date  . Hypoactive thyroid   . Asthma   . Allergy   . HERPES SIMPLEX, UNCOMPLICATED [id number]  . HYPERTHYROIDISM 05/23/2007  . HYPOTHYROIDISM 05/09/2008  . VITAMIN D DEFICIENCY 08/28/2009  . GLUCOSE INTOLERANCE 08/28/2009  . OBESITY 05/23/2007  . ANXIETY 05/23/2007  . SINUSITIS- ACUTE-NOS 11/03/2008  . URI 08/28/2009  . ALLERGIC RHINITIS 05/09/2008  . ASTHMA 05/23/2007  . UTI 04/29/2008  . ENDOMETRIOSIS NOS 05/23/2007  . CONTACT DERMATITIS 06/09/2009  . UNSPECIFIED URTICARIA 06/04/2009  . SHOULDER PAIN, LEFT 05/17/2010  . LATERAL EPICONDYLITIS, RIGHT 03/10/2009  . ARM PAIN, LEFT 10/23/2009  . FACIAL PAIN 06/02/2010  . Wheezing 07/12/2010  . Unspecified Chest Pain 07/25/2008  . FREQUENCY, URINARY 05/17/2010  . INSOMNIA, HX OF 05/23/2007  . LIPOMA 10/22/2010  . SKIN LESION 11/10/2010  . NECK PAIN 11/10/2010  . Impaired glucose  tolerance 01/28/2011  . Diabetes mellitus   . Plantar fasciitis     Both feet  . Hyperlipidemia 08/28/2015   Past Surgical History  Procedure Laterality Date  . Endometrial ablation    . Wisdom tooth extraction    . Uterine fibroid surgery    . Laminectomy  1996  . Laparotomy      exploratory  . S/p endometrial ablation  12/06  . Tubal ligation     Social History  Substance Use Topics  . Smoking status: Former Smoker -- 0.50 packs/day    Types: Cigarettes    Quit date: 11/05/1987  . Smokeless tobacco: Never Used  . Alcohol Use: No     Comment: WINE   Allergies  Allergen Reactions  . Ivp Dye [Iodinated Diagnostic Agents] Shortness Of Breath and Other (See Comments)    Swelling of extremity of the injection site  . Latex Itching    ITCHING AND COLD SORES AROUND MOUTH WHEN LATEX GLOVES USED BY THE DENTIST/HYGENTIST  . Clarithromycin     REACTION: nausea  vomiting  . Metronidazole Nausea And Vomiting   Family History  Problem Relation Age of Onset  . Cancer Maternal Grandmother     Breast  . Diabetes Cousin   . Hypertension Cousin   . Heart disease Cousin   . Diabetes Mother   . Heart disease Mother   . COPD Father   . Hypertension Father   . Diabetes Sister   . Kidney  disease Sister      Past medical history, social, surgical and family history all reviewed in electronic medical record.   Review of Systems: No headache, visual changes, nausea, vomiting, diarrhea, constipation, dizziness, abdominal pain, skin rash, fevers, chills, night sweats, weight loss, swollen lymph nodes, body aches, joint swelling, muscle aches, chest pain, shortness of breath, mood changes.   Objective Blood pressure 126/82, pulse 76, weight 227 lb (102.967 kg), SpO2 98 %.  General: No apparent distress alert and oriented x3 mood and affect normal, dressed appropriately.  HEENT: Pupils equal, extraocular movements intact  Respiratory: Patient's speak in full sentences and does not appear  short of breath  Cardiovascular: 2+ pitting edema of the left lower extremity and 1+ pitting edema on the right severely tender to palpation bilaterally Skin: Warm dry intact with no signs of infection or rash on extremities or on axial skeleton.  Abdomen: Soft nontender  Neuro: Cranial nerves II through XII are intact, neurovascularly intact in all extremities with 2+ DTRs and 2+ pulses.  Lymph: No lymphadenopathy of posterior or anterior cervical chain or axillae bilaterally.  Gait normal with good balance and coordination.  MSK:  Non tender with full range of motion and good stability and symmetric strength and tone of shoulders, elbows, wrist, hip, and ankles bilaterally.  Knee: Bilateral Normal to inspection with no erythema or effusion or obvious bony abnormalities. Moderate tenderness to palpation over the medial joint line left greater than right ROM full in flexion and extension and lower leg rotation. Ligaments with solid consistent endpoints including ACL, PCL, LCL, MCL. Negative Mcmurray's, Apley's, and Thessalonian tests. Non painful patellar compression. Patellar glide without crepitus. Patellar and quadriceps tendons unremarkable. Hamstring and quadriceps strength is normal.  Severe tenderness of the cast bilaterally. Patient also has some mild tenderness of the quadriceps and hamstrings to palpation.   MSK US performed of: Left knee This study was ordered, performed, and interpreted by Charlann Boxer D.O.  Knee: All structures visualized. Mild narrowing of the medial joint space bilaterally when comparing to the contralateral knee Patellar Tendon unremarkable on long and transverse views without effusion. No abnormality of prepatellar bursa. LCL and MCL unremarkable on long and transverse views. No abnormality of origin of medial or lateral head of the gastrocnemius.  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  This included exercises focusing on stretching, strengthening, with significant focus on eccentric aspects.  Range of motion exercises as well as pedal pump. Flexion and extension exercises with eccentric some stair. Stretching of the Achilles and the posterior capsule. Proper technique shown and discussed handout in great detail with ATC.  All questions were discussed and answered.     Impression and Recommendations:     This case required medical decision making of moderate complexity.

## 2015-08-31 NOTE — Patient Instructions (Addendum)
Good to see you.  We will get ultrasound today.  Ice 20 minutes 2 times daily. Usually after activity and before bed. Exercises 3 times a week.  Vitamin D 2000 IU daily Iron 65mg  3 times a week Magnesium 400mg  at night Turmeric 500mg  twice daily  Good shoes with rigid bottom.  Suzanne Hansen, [place] or New balance greater then 700 Avoid being barefoot.  Lets try these changes and see how it goes.  Come back and see me again in 2-3 weeks.

## 2015-08-31 NOTE — Progress Notes (Signed)
Pre visit review using our clinic review tool, if applicable. No additional management support is needed unless otherwise documented below in the visit note. 

## 2015-08-31 NOTE — Assessment & Plan Note (Signed)
Added a low Lasix dose to see if this will be beneficial to decrease some of the swelling.

## 2015-08-31 NOTE — Assessment & Plan Note (Signed)
Patient is having more of a bilateral calf pain. I do not see any significant findings at this time except patient does have a lower extremity edema and patient does have enlargement of the left compared to the right I do feel that we should get an ultrasound to rule out any type of clot secondary to the amount of pain patient is having. Likely though this differential is going to be more the swelling. Discussed icing, home exercises, possible compression. Patient given home exercises. Differential also includes a peripheral neuropathy the patient has controlled her diabetes very well with an A1c recently of 5.3. Patient could also have more of a thyroid disease. Patient will come back and see me again in 2-3 weeks to make sure she is responding appropriately. 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 I think his low likelihood but once again secondary to the risk we should rule it out.

## 2015-09-02 ENCOUNTER — Ambulatory Visit (HOSPITAL_COMMUNITY)
Admission: RE | Admit: 2015-09-02 | Discharge: 2015-09-02 | Disposition: A | Discharge status: Home / Self Care | Payer: BLUE CROSS/BLUE SHIELD | Source: Ambulatory Visit | Attending: Family Medicine | Admitting: Family Medicine

## 2015-09-02 DIAGNOSIS — E785 Hyperlipidemia, unspecified: Secondary | ICD-10-CM | POA: Diagnosis not present

## 2015-09-02 DIAGNOSIS — M79605 Pain in left leg: Secondary | ICD-10-CM

## 2015-09-02 DIAGNOSIS — E119 Type 2 diabetes mellitus without complications: Secondary | ICD-10-CM | POA: Diagnosis not present

## 2015-09-07 ENCOUNTER — Other Ambulatory Visit: Payer: Self-pay | Admitting: Internal Medicine

## 2015-09-08 ENCOUNTER — Other Ambulatory Visit: Payer: Self-pay

## 2015-09-08 ENCOUNTER — Encounter: Payer: Self-pay | Admitting: Internal Medicine

## 2015-09-08 ENCOUNTER — Ambulatory Visit (HOSPITAL_COMMUNITY): Payer: BLUE CROSS/BLUE SHIELD | Attending: Cardiovascular Disease

## 2015-09-08 ENCOUNTER — Other Ambulatory Visit: Payer: Self-pay | Admitting: Internal Medicine

## 2015-09-08 DIAGNOSIS — E119 Type 2 diabetes mellitus without complications: Secondary | ICD-10-CM | POA: Insufficient documentation

## 2015-09-08 DIAGNOSIS — I5031 Acute diastolic (congestive) heart failure: Secondary | ICD-10-CM | POA: Insufficient documentation

## 2015-09-08 DIAGNOSIS — R609 Edema, unspecified: Secondary | ICD-10-CM | POA: Diagnosis not present

## 2015-09-08 DIAGNOSIS — I071 Rheumatic tricuspid insufficiency: Secondary | ICD-10-CM | POA: Diagnosis not present

## 2015-09-08 DIAGNOSIS — I517 Cardiomegaly: Secondary | ICD-10-CM | POA: Insufficient documentation

## 2015-09-08 DIAGNOSIS — E785 Hyperlipidemia, unspecified: Secondary | ICD-10-CM

## 2015-09-08 DIAGNOSIS — Z6838 Body mass index (BMI) 38.0-38.9, adult: Secondary | ICD-10-CM | POA: Diagnosis not present

## 2015-09-08 DIAGNOSIS — E669 Obesity, unspecified: Secondary | ICD-10-CM | POA: Insufficient documentation

## 2015-09-08 DIAGNOSIS — I429 Cardiomyopathy, unspecified: Secondary | ICD-10-CM | POA: Insufficient documentation

## 2015-09-08 HISTORY — DX: Cardiomyopathy, unspecified: I42.9

## 2015-09-09 ENCOUNTER — Ambulatory Visit (INDEPENDENT_AMBULATORY_CARE_PROVIDER_SITE_OTHER): Payer: BLUE CROSS/BLUE SHIELD | Admitting: Cardiovascular Disease

## 2015-09-09 ENCOUNTER — Encounter: Payer: Self-pay | Admitting: Internal Medicine

## 2015-09-09 ENCOUNTER — Encounter: Payer: Self-pay | Admitting: Cardiovascular Disease

## 2015-09-09 VITALS — BP 110/70 | HR 77 | Ht 65.0 in | Wt 227.4 lb

## 2015-09-09 DIAGNOSIS — I428 Other cardiomyopathies: Secondary | ICD-10-CM

## 2015-09-09 DIAGNOSIS — I429 Cardiomyopathy, unspecified: Secondary | ICD-10-CM

## 2015-09-09 DIAGNOSIS — I5022 Chronic systolic (congestive) heart failure: Secondary | ICD-10-CM | POA: Diagnosis not present

## 2015-09-09 MED ORDER — LISINOPRIL 5 MG PO TABS: 5.0000 mg | ORAL_TABLET | Freq: Every day | ORAL | Status: DC

## 2015-09-09 MED ORDER — FUROSEMIDE 40 MG PO TABS: 40.0000 mg | ORAL_TABLET | Freq: Every day | ORAL | Status: DC

## 2015-09-09 MED ORDER — CARVEDILOL 3.125 MG PO TABS: 3.1250 mg | ORAL_TABLET | Freq: Two times a day (BID) | ORAL | Status: DC

## 2015-09-09 NOTE — Progress Notes (Signed)
Chief Complaint  Patient presents with  . New Evaluation    CARIOMYOPATHY, PT C/O SWELLING IN LEGS AND FEET    History of Present Illness: 59 yo Hansen with history of DM, HLD, hypothyroidism with recent diagnosis of cardiomyopathy who is here today as a new patient for evaluation of cardiomyopathy, LE edema. She has chronic LE edema. Negative venous doppler left LE last week. NO DVT. Normal stress myoview December 2014 with no ischemia but LVEF=48% at that time. She has been followed in primary care and has been on HCTZ and recently started on Lasix for LE edema. Echo 09/08/15 with LVEF=40-45%.   She notes mild improvement in LE edema with Lasix. She denies chest pain but does have mild dyspnea with exertion. She has no dizziness, near syncope or syncope.   Primary Care Physician: Cathlean Cower, MD  Past Medical History  Diagnosis Date  . Hypoactive thyroid   . Asthma   . Allergy   . HERPES SIMPLEX, UNCOMPLICATED [id number]  . HYPERTHYROIDISM 05/23/2007  . HYPOTHYROIDISM 05/09/2008  . VITAMIN D DEFICIENCY 08/28/2009  . GLUCOSE INTOLERANCE 08/28/2009  . OBESITY 05/23/2007  . ANXIETY 05/23/2007  . SINUSITIS- ACUTE-NOS 11/03/2008  . URI 08/28/2009  . ALLERGIC RHINITIS 05/09/2008  . ASTHMA 05/23/2007  . UTI 04/29/2008  . ENDOMETRIOSIS NOS 05/23/2007  . CONTACT DERMATITIS 06/09/2009  . UNSPECIFIED URTICARIA 06/04/2009  . SHOULDER PAIN, LEFT 05/17/2010  . LATERAL EPICONDYLITIS, RIGHT 03/10/2009  . ARM PAIN, LEFT 10/23/2009  . FACIAL PAIN 06/02/2010  . Wheezing 07/12/2010  . Unspecified Chest Pain 07/25/2008  . FREQUENCY, URINARY 05/17/2010  . INSOMNIA, HX OF 05/23/2007  . LIPOMA 10/22/2010  . SKIN LESION 11/10/2010  . NECK PAIN 11/10/2010  . Impaired glucose tolerance 01/28/2011  . Diabetes mellitus   . Plantar fasciitis     Both feet  . Hyperlipidemia 08/28/2015  . Cardiomyopathy ([place]) 09/08/2015    Past Surgical History  Procedure Laterality Date  . Endometrial ablation    . Wisdom tooth  extraction    . Uterine fibroid surgery    . Laminectomy  1996  . Laparotomy      exploratory  . S/p endometrial ablation  12/06  . Tubal ligation      Current Outpatient Prescriptions  Medication Sig Dispense Refill  . albuterol (PROAIR HFA) 108 (90 BASE) MCG/ACT inhaler Inhale 2 puffs into the lungs every 4 (four) hours as needed for wheezing or shortness of breath. 18 each 2  . aspirin 81 MG EC tablet Take 81 mg by mouth daily.      . Calcium Citrate-Vitamin D (CITRACAL PETITES/VITAMIN D PO) Take 1 tablet by mouth daily.    . Cholecalciferol (VITAMIN D) 2000 UNITS CAPS Take 2 capsules by mouth daily.    [name] estradiol-norethindrone (MIMVEY) 1-0.5 MG per tablet Take 1 tablet by mouth daily.    . Ferrous Sulfate (IRON) 325 (65 FE) MG TABS Take 1 tablet by mouth daily.    . fexofenadine (ALLEGRA) 180 MG tablet Take 180 mg by mouth daily.      . furosemide (LASIX) 40 MG tablet Take 1 tablet (40 mg total) by mouth daily. 30 tablet 6  . levothyroxine (SYNTHROID, LEVOTHROID) 112 MCG tablet Take 100 mcg by mouth daily.     . magnesium oxide (MAG-OX) 400 MG tablet Take 400 mg by mouth daily.    . mometasone (NASONEX) 50 MCG/ACT nasal spray Place 2 sprays into the nose daily. 17 g 11  . Multiple Vitamin (  MULTIVITAMIN) capsule Take 1 capsule by mouth daily.      . rosuvastatin (CRESTOR) 10 MG tablet Take 1 tablet (10 mg total) by mouth daily. 90 tablet 3  . SYMBICORT 160-4.5 MCG/ACT inhaler INHALE 2 PUFFS INTO THE LUNGS 2 (TWO) TIMES DAILY. 10.2 Inhaler 2  . Turmeric 500 MG TABS Take 500 mg by mouth daily.    . carvedilol (COREG) 3.125 MG tablet Take 1 tablet (3.125 mg total) by mouth 2 (two) times daily. 60 tablet 6  . lisinopril (PRINIVIL,ZESTRIL) 5 MG tablet Take 1 tablet (5 mg total) by mouth daily. 30 tablet 6   No current facility-administered medications for this visit.    Allergies  Allergen Reactions  . Ivp Dye [Iodinated Diagnostic Agents] Shortness Of Breath and Other (See Comments)     Swelling of extremity of the injection site  . Latex Itching    ITCHING AND COLD SORES AROUND MOUTH WHEN LATEX GLOVES USED BY THE DENTIST/HYGENTIST  . Clarithromycin Nausea And Vomiting    REACTION: nausea  vomiting  . Metronidazole Nausea And Vomiting    Social History   Social History  . Marital Status: Divorced    Spouse Name: N/A  . Number of Children: 3  . Years of Education: N/A   Occupational History  . [employer]   Social History Main Topics  . Smoking status: Former Smoker -- 0.50 packs/day    Types: Cigarettes    Quit date: 11/05/1987  . Smokeless tobacco: Never Used  . Alcohol Use: No     Comment: WINE  . Drug Use: No  . Sexual Activity: Not on file   Other Topics Concern  . Not on file   Social History Narrative    Family History  Problem Relation Age of Onset  . Cancer Maternal Grandmother     Breast  . Diabetes Cousin   . Hypertension Cousin   . Heart disease Cousin   . Diabetes Mother   . Heart disease Mother     Rheumatic heart disease  . COPD Father   . Hypertension Father   . Diabetes Sister   . Kidney disease Sister     Review of Systems:  As stated in the HPI and otherwise negative.   BP 110/70 mmHg  Pulse 77  Ht 5\' 5"  (1.651 m)  Wt 227 lb 6.4 oz (103.148 kg)  BMI 37.84 kg/m2  SpO2 97%  Physical Examination: General: Well developed, well nourished, NAD HEENT: OP clear, mucus membranes moist SKIN: warm, dry. No rashes. Neuro: No focal deficits Musculoskeletal: Muscle strength 5/5 all ext Psychiatric: Mood and affect normal Neck: No JVD, no carotid bruits, no thyromegaly, no lymphadenopathy. Lungs:Clear bilaterally, no wheezes, rhonci, crackles Cardiovascular: Regular rate and rhythm. No murmurs, gallops or rubs. Abdomen:Soft. Bowel sounds present. Non-tender.  Extremities: Trace to 1+ bilateral lower extremity edema.   Echo 09/08/15: Left ventricle: The cavity size was normal. There was mild  focal basal hypertrophy of the septum. Systolic function was mildly to moderately reduced. The estimated ejection fraction was in the range of 40% to 45%. Hypokinesis of the anteroseptal and inferoseptal myocardium. Doppler parameters are consistent with abnormal left ventricular relaxation (grade 1 diastolic dysfunction). - Right ventricle: The cavity size was normal. Wall thickness was normal. Systolic function was normal. - Tricuspid valve: There was trivial regurgitation. - Pulmonary arteries: PA peak pressure: 26 mm Hg (S). - Dyssynchrony, with contraction of the anterior and lateral walls prior to  the septal and inferior walls.  EKG:  EKG is ordered today. The ekg ordered today demonstrates  NSR, rate 77 bpm. Non-specific T wave abnormality.   Recent Labs: 08/19/2015: ALT 18; BUN 16; Creatinine, Ser 0.97; Hemoglobin 15.0; Platelets 255.0; Potassium 3.9; Sodium 137; TSH 3.25   Lipid Panel    Component Value Date/Time   CHOL 182 08/19/2015 1107   TRIG 53.0 08/19/2015 1107   HDL 73.50 08/19/2015 1107   CHOLHDL 2 08/19/2015 1107   VLDL 10.6 08/19/2015 1107   LDLCALC 98 08/19/2015 1107     Wt Readings from Last 3 Encounters:  09/09/15 227 lb 6.4 oz (103.148 kg)  08/31/15 227 lb (102.967 kg)  08/28/15 230 lb (104.327 kg)     Other studies Reviewed: Additional studies/ records that were reviewed today include: . Review of the above records demonstrates:    Assessment and Plan:   1. Non-ischemic cardiomyopathy: She has mild reduction LV systolic function. Normal stress test two years ago and no symptoms to suggest obstructive CAD. I will start Coreg 3.125 mg po BID and Lisinopril 5 mg daily. I will not repeat ischemic testing at this time. Stop HCTZ.   2. Chronic systolic CHF/LE edema: Her LE edema may be related to her systolic dysfunction. Will increase Lasix to 40 mg daily. Stop HCTZ. Check BMET 7 days.   Current medicines are reviewed at length with  the patient today.  The patient does not have concerns regarding medicines.  The following changes have been made:  no change  Labs/ tests ordered today include:   Orders Placed This Encounter  Procedures  . Basic Metabolic Panel (BMET)  . EKG 12-Lead    Disposition:   FU with me in 2-3 months  Signed, Lauree Chandler, MD 09/09/2015 1:27 PM    [facility] [place], [place], [place]  [address] Phone: [phone]; Fax: [phone]

## 2015-09-09 NOTE — Patient Instructions (Addendum)
Medication Instructions:  Your physician has recommended you make the following change in your medication:  Stop hydrochlorothiazide. Increase furosemide to 40 mg by mouth daily. Start Coreg 3.125 mg by mouth twice daily. Start Lisinopril 5 mg by mouth daily.   Labwork: Your physician recommends that you return for lab work on September 17, 2015.  The lab opens at 7:30 AM   Testing/Procedures: none  Follow-Up: Your physician recommends that you schedule a follow-up appointment in: 6-8 weeks.  Scheduled for January 12,2017 at 11:00   Any Other Special Instructions Will Be Listed Below (If Applicable).     If you need a refill on your cardiac medications before your next appointment, please call your pharmacy.

## 2015-09-16 ENCOUNTER — Encounter: Payer: Self-pay | Admitting: Internal Medicine

## 2015-09-17 ENCOUNTER — Ambulatory Visit (INDEPENDENT_AMBULATORY_CARE_PROVIDER_SITE_OTHER): Payer: BLUE CROSS/BLUE SHIELD | Admitting: Family Medicine

## 2015-09-17 ENCOUNTER — Other Ambulatory Visit (INDEPENDENT_AMBULATORY_CARE_PROVIDER_SITE_OTHER): Payer: BLUE CROSS/BLUE SHIELD | Admitting: *Deleted

## 2015-09-17 ENCOUNTER — Encounter: Payer: Self-pay | Admitting: Family Medicine

## 2015-09-17 VITALS — BP 112/80 | HR 61 | Wt 228.0 lb

## 2015-09-17 DIAGNOSIS — I429 Cardiomyopathy, unspecified: Secondary | ICD-10-CM | POA: Diagnosis not present

## 2015-09-17 DIAGNOSIS — I5022 Chronic systolic (congestive) heart failure: Secondary | ICD-10-CM

## 2015-09-17 DIAGNOSIS — M25562 Pain in left knee: Secondary | ICD-10-CM | POA: Diagnosis not present

## 2015-09-17 DIAGNOSIS — I428 Other cardiomyopathies: Secondary | ICD-10-CM

## 2015-09-17 LAB — BASIC METABOLIC PANEL
BUN: 16 mg/dL (ref 7–25)
CALCIUM: 9.7 mg/dL (ref 8.6–10.4)
CO2: 24 mmol/L (ref 20–31)
CREATININE: 0.97 mg/dL (ref 0.50–1.05)
Chloride: 104 mmol/L (ref 98–110)
GLUCOSE: 99 mg/dL (ref 65–99)
Potassium: 4.1 mmol/L (ref 3.5–5.3)
Sodium: 140 mmol/L (ref 135–146)

## 2015-09-17 MED ORDER — DICLOFENAC SODIUM 2 % TD SOLN: TRANSDERMAL | Status: DC

## 2015-09-17 NOTE — Progress Notes (Signed)
[name] [address] [place], [place] [address] Phone: [phone] Subjective:    CC: Bilateral knee pain follow-up  [id number] Suzanne Hansen is a 59 y.o. female coming in with complaint of bilateral knee pain. Patient was seen previously and had moderate arthritic changes of the medial compartment bilaterally. Patient elected try conservative therapy. States that she is approximately 25-35% better. States that the cramping in her legs has improved significantly since she started on the iron. States that she is not having any the pain is waking her up at night. Did get the other vitamins that have been beneficial. Patient has been happy overall. Patient though is having difficulty though continuing to do down on the ground and then trying to get up. Pain still on the medial aspects of the knee. Denies any significant instability.     Past Medical History  Diagnosis Date  . Hypoactive thyroid   . Asthma   . Allergy   . HERPES SIMPLEX, UNCOMPLICATED [id number]  . HYPERTHYROIDISM 05/23/2007  . HYPOTHYROIDISM 05/09/2008  . VITAMIN D DEFICIENCY 08/28/2009  . GLUCOSE INTOLERANCE 08/28/2009  . OBESITY 05/23/2007  . ANXIETY 05/23/2007  . SINUSITIS- ACUTE-NOS 11/03/2008  . URI 08/28/2009  . ALLERGIC RHINITIS 05/09/2008  . ASTHMA 05/23/2007  . UTI 04/29/2008  . ENDOMETRIOSIS NOS 05/23/2007  . CONTACT DERMATITIS 06/09/2009  . UNSPECIFIED URTICARIA 06/04/2009  . SHOULDER PAIN, LEFT 05/17/2010  . LATERAL EPICONDYLITIS, RIGHT 03/10/2009  . ARM PAIN, LEFT 10/23/2009  . FACIAL PAIN 06/02/2010  . Wheezing 07/12/2010  . Unspecified Chest Pain 07/25/2008  . FREQUENCY, URINARY 05/17/2010  . INSOMNIA, HX OF 05/23/2007  . LIPOMA 10/22/2010  . SKIN LESION 11/10/2010  . NECK PAIN 11/10/2010  . Impaired glucose tolerance 01/28/2011  . Diabetes mellitus   . Plantar fasciitis     Both feet  . Hyperlipidemia 08/28/2015  . Cardiomyopathy ([place]) 09/08/2015   Past Surgical History    Procedure Laterality Date  . Endometrial ablation    . Wisdom tooth extraction    . Uterine fibroid surgery    . Laminectomy  1996  . Laparotomy      exploratory  . S/p endometrial ablation  12/06  . Tubal ligation     Social History  Substance Use Topics  . Smoking status: Former Smoker -- 0.50 packs/day    Types: Cigarettes    Quit date: 11/05/1987  . Smokeless tobacco: Never Used  . Alcohol Use: No     Comment: WINE   Allergies  Allergen Reactions  . Ivp Dye [Iodinated Diagnostic Agents] Shortness Of Breath and Other (See Comments)    Swelling of extremity of the injection site  . Latex Itching    ITCHING AND COLD SORES AROUND MOUTH WHEN LATEX GLOVES USED BY THE DENTIST/HYGENTIST  . Clarithromycin Nausea And Vomiting    REACTION: nausea  vomiting  . Metronidazole Nausea And Vomiting   Family History  Problem Relation Age of Onset  . Cancer Maternal Grandmother     Breast  . Diabetes Cousin   . Hypertension Cousin   . Heart disease Cousin   . Diabetes Mother   . Heart disease Mother     Rheumatic heart disease  . COPD Father   . Hypertension Father   . Diabetes Sister   . Kidney disease Sister      Past medical history, social, surgical and family history all reviewed in electronic medical record.   Review of Systems: No headache, visual changes, nausea,  vomiting, diarrhea, constipation, dizziness, abdominal pain, skin rash, fevers, chills, night sweats, weight loss, swollen lymph nodes, body aches, joint swelling, muscle aches, chest pain, shortness of breath, mood changes.   Objective Blood pressure 112/80, pulse 61, weight 228 lb (103.42 kg).  General: No apparent distress alert and oriented x3 mood and affect normal, dressed appropriately.  HEENT: Pupils equal, extraocular movements intact  Respiratory: Patient's speak in full sentences and does not appear short of breath  Cardiovascular: 2+ pitting edema of the left lower extremity and 1+ pitting edema  on the right severely tender to palpation bilaterally Skin: Warm dry intact with no signs of infection or rash on extremities or on axial skeleton.  Abdomen: Soft nontender  Neuro: Cranial nerves II through XII are intact, neurovascularly intact in all extremities with 2+ DTRs and 2+ pulses.  Lymph: No lymphadenopathy of posterior or anterior cervical chain or axillae bilaterally.  Gait normal with good balance and coordination.  MSK:  Non tender with full range of motion and good stability and symmetric strength and tone of shoulders, elbows, wrist, hip, and ankles bilaterally.  Knee: Bilateral Normal to inspection with no erythema or effusion or obvious bony abnormalities. Moderate tenderness to palpation over the medial joint line left greater than right ROM full in flexion and extension and lower leg rotation. Ligaments with solid consistent endpoints including ACL, PCL, LCL, MCL. Negative Mcmurray's, Apley's, and Thessalonian tests. Non painful patellar compression. Patellar glide without crepitus. Patellar and quadriceps tendons unremarkable. Hamstring and quadriceps strength is normal.  Calf pain seems to be improved bilaterally.          Impression and Recommendations:     This case required medical decision making of moderate complexity.

## 2015-09-17 NOTE — Assessment & Plan Note (Signed)
Discussed with patient at great length. Patient's left knee pain continues to give her more discomfort. We discussed the possibility of injections which patient declined. Patient will start with formal physical therapy and was referred today. We discussed icing regimen. Discussed bracing which patient also declined. We will continue all of the trochanteric conservative therapy. Patient was given an exercise prescription in greater detail today as well. Patient come back and see me again in 4-6 weeks. If continuing have pain I would consider injection.

## 2015-09-17 NOTE — Patient Instructions (Signed)
Good ot see you Happy holidays! pennsaid pinkie amount topically 2 times daily as needed.  Physical therapy will be calling you Continue with the vitamins and the good shoes See me again in 3-4 weeks and if not a lot better we will do an injection.

## 2015-09-18 ENCOUNTER — Telehealth: Payer: Self-pay

## 2015-09-18 NOTE — Telephone Encounter (Signed)
Patient would like to know if she can restart her Minivelle 0.05 mg patch and Prometrium 100 mg daily

## 2015-09-18 NOTE — Telephone Encounter (Signed)
Called patient and let her know that Dr. Angelena Form said it was ok to restart her Minivelle patch and her Prometrium

## 2015-09-18 NOTE — Telephone Encounter (Signed)
That should be ok. Thanks, chris

## 2015-09-24 ENCOUNTER — Encounter: Payer: Self-pay | Admitting: Vascular Surgery

## 2015-09-24 ENCOUNTER — Other Ambulatory Visit: Payer: Self-pay | Admitting: *Deleted

## 2015-09-24 DIAGNOSIS — I83813 Varicose veins of bilateral lower extremities with pain: Secondary | ICD-10-CM

## 2015-09-29 ENCOUNTER — Ambulatory Visit (INDEPENDENT_AMBULATORY_CARE_PROVIDER_SITE_OTHER): Payer: BLUE CROSS/BLUE SHIELD | Admitting: Internal Medicine

## 2015-09-29 ENCOUNTER — Encounter: Payer: Self-pay | Admitting: Internal Medicine

## 2015-09-29 VITALS — BP 114/86 | HR 69 | Temp 97.9°F | Ht 65.0 in | Wt 225.0 lb

## 2015-09-29 DIAGNOSIS — J019 Acute sinusitis, unspecified: Secondary | ICD-10-CM

## 2015-09-29 DIAGNOSIS — J4520 Mild intermittent asthma, uncomplicated: Secondary | ICD-10-CM

## 2015-09-29 DIAGNOSIS — E119 Type 2 diabetes mellitus without complications: Secondary | ICD-10-CM | POA: Diagnosis not present

## 2015-09-29 MED ORDER — HYDROCOD POLST-CPM POLST ER 10-8 MG/5ML PO SUER: 5.0000 mL | Freq: Two times a day (BID) | ORAL | Status: DC | PRN

## 2015-09-29 MED ORDER — LEVOFLOXACIN 500 MG PO TABS: 500.0000 mg | ORAL_TABLET | Freq: Every day | ORAL | Status: DC

## 2015-09-29 NOTE — Progress Notes (Signed)
Pre visit review using our clinic review tool, if applicable. No additional management support is needed unless otherwise documented below in the visit note. 

## 2015-09-29 NOTE — Progress Notes (Signed)
Subjective:    Patient ID: Suzanne Hansen, female    DOB: 27-Dec-1955, 59 y.o.   MRN: [id number]  HPI   Here with 2-3 days acute onset fever, facial pain, pressure, headache, general weakness and malaise, and greenish d/c, with mild ST and cough, but pt denies chest pain, wheezing, increased sob or doe, orthopnea, PND, increased LE swelling, palpitations, dizziness or syncope. Pt denies new neurological symptoms such as new headache, or facial or extremity weakness or numbness   Pt denies polydipsia, polyuria Past Medical History  Diagnosis Date  . Hypoactive thyroid   . Asthma   . Allergy   . HERPES SIMPLEX, UNCOMPLICATED [id number]  . HYPERTHYROIDISM 05/23/2007  . HYPOTHYROIDISM 05/09/2008  . VITAMIN D DEFICIENCY 08/28/2009  . GLUCOSE INTOLERANCE 08/28/2009  . OBESITY 05/23/2007  . ANXIETY 05/23/2007  . SINUSITIS- ACUTE-NOS 11/03/2008  . URI 08/28/2009  . ALLERGIC RHINITIS 05/09/2008  . ASTHMA 05/23/2007  . UTI 04/29/2008  . ENDOMETRIOSIS NOS 05/23/2007  . CONTACT DERMATITIS 06/09/2009  . UNSPECIFIED URTICARIA 06/04/2009  . SHOULDER PAIN, LEFT 05/17/2010  . LATERAL EPICONDYLITIS, RIGHT 03/10/2009  . ARM PAIN, LEFT 10/23/2009  . FACIAL PAIN 06/02/2010  . Wheezing 07/12/2010  . Unspecified Chest Pain 07/25/2008  . FREQUENCY, URINARY 05/17/2010  . INSOMNIA, HX OF 05/23/2007  . LIPOMA 10/22/2010  . SKIN LESION 11/10/2010  . NECK PAIN 11/10/2010  . Impaired glucose tolerance 01/28/2011  . Diabetes mellitus   . Plantar fasciitis     Both feet  . Hyperlipidemia 08/28/2015  . Cardiomyopathy ([place]) 09/08/2015   Past Surgical History  Procedure Laterality Date  . Endometrial ablation    . Wisdom tooth extraction    . Uterine fibroid surgery    . Laminectomy  1996  . Laparotomy      exploratory  . S/p endometrial ablation  12/06  . Tubal ligation      reports that she quit smoking about 27 years ago. Her smoking use included Cigarettes. She smoked 0.50 packs per day. She has never used smokeless  tobacco. She reports that she does not drink alcohol or use illicit drugs. family history includes COPD in her father; Cancer in her maternal grandmother; Diabetes in her cousin, mother, and sister; Heart disease in her cousin and mother; Hypertension in her cousin and father; Kidney disease in her sister. Allergies  Allergen Reactions  . Ivp Dye [Iodinated Diagnostic Agents] Shortness Of Breath and Other (See Comments)    Swelling of extremity of the injection site  . Latex Itching    ITCHING AND COLD SORES AROUND MOUTH WHEN LATEX GLOVES USED BY THE DENTIST/HYGENTIST  . Clarithromycin Nausea And Vomiting    REACTION: nausea  vomiting  . Metronidazole Nausea And Vomiting   Current Outpatient Prescriptions on File Prior to Visit  Medication Sig Dispense Refill  . albuterol (PROAIR HFA) 108 (90 BASE) MCG/ACT inhaler Inhale 2 puffs into the lungs every 4 (four) hours as needed for wheezing or shortness of breath. 18 each 2  . aspirin 81 MG EC tablet Take 81 mg by mouth daily.      . Calcium Citrate-Vitamin D (CITRACAL PETITES/VITAMIN D PO) Take 1 tablet by mouth daily.    . carvedilol (COREG) 3.125 MG tablet Take 1 tablet (3.125 mg total) by mouth 2 (two) times daily. 60 tablet 6  . Cholecalciferol (VITAMIN D) 2000 UNITS CAPS Take 2 capsules by mouth daily.    . Diclofenac Sodium 2 % SOLN Apply 1 pump twice daily. 112 g  3  . estradiol (VIVELLE-DOT) 0.05 MG/24HR patch Place 1 patch onto the skin 2 (two) times a week.    . estradiol-norethindrone (MIMVEY) 1-0.5 MG per tablet Take 1 tablet by mouth daily.    . Ferrous Sulfate (IRON) 325 (65 FE) MG TABS Take 1 tablet by mouth daily.    . fexofenadine (ALLEGRA) 180 MG tablet Take 180 mg by mouth daily.      . furosemide (LASIX) 40 MG tablet Take 1 tablet (40 mg total) by mouth daily. 30 tablet 6  . lisinopril (PRINIVIL,ZESTRIL) 5 MG tablet Take 1 tablet (5 mg total) by mouth daily. 30 tablet 6  . magnesium oxide (MAG-OX) 400 MG tablet Take 400 mg  by mouth daily.    . mometasone (NASONEX) 50 MCG/ACT nasal spray Place 2 sprays into the nose daily. 17 g 11  . Multiple Vitamin (MULTIVITAMIN) capsule Take 1 capsule by mouth daily.      . progesterone (PROMETRIUM) 100 MG capsule Take 100 mg by mouth daily.    . rosuvastatin (CRESTOR) 10 MG tablet Take 1 tablet (10 mg total) by mouth daily. 90 tablet 3  . SYMBICORT 160-4.5 MCG/ACT inhaler INHALE 2 PUFFS INTO THE LUNGS 2 (TWO) TIMES DAILY. 10.2 Inhaler 2  . Turmeric 500 MG TABS Take 500 mg by mouth daily.    [name] levothyroxine (SYNTHROID, LEVOTHROID) 112 MCG tablet Take 100 mcg by mouth daily. Reported on 09/29/2015     No current facility-administered medications on file prior to visit.   Review of Systems  Constitutional: Negative for unusual diaphoresis or night sweats HENT: Negative for ringing in ear or discharge Eyes: Negative for double vision or worsening visual disturbance.  Respiratory: Negative for choking and stridor.   Gastrointestinal: Negative for vomiting or other signifcant bowel change Genitourinary: Negative for hematuria or change in urine volume.  Musculoskeletal: Negative for other MSK pain or swelling Skin: Negative for color change and worsening wound.  Neurological: Negative for tremors and numbness other than noted  Psychiatric/Behavioral: Negative for decreased concentration or agitation other than above       Objective:   Physical Exam BP 114/86 mmHg  Pulse 69  Temp(Src) 97.9 F (36.6 C) (Oral)  Ht 5\' 5"  (1.651 m)  Wt 225 lb (102.059 kg)  BMI 37.44 kg/m2 VS noted, mild ill  Constitutional: Pt appears in no significant distress HENT: Head: NCAT.  Right Ear: External ear normal.  Left Ear: External ear normal.  Eyes: . Pupils are equal, round, and reactive to light. Conjunctivae and EOM are normal Bilat tm's with mild erythema.  Max sinus areas mild tender.  Pharynx with mild erythema, no exudate Neck: Normal range of motion. Neck supple.    Cardiovascular: Normal rate and regular rhythm.   Pulmonary/Chest: Effort normal and breath sounds without rales or wheezing.  Neurological: Pt is alert. Not confused , motor grossly intact Skin: Skin is warm. No rash, no LE edema Psychiatric: Pt behavior is normal. No agitation.     Assessment & Plan:

## 2015-09-29 NOTE — Patient Instructions (Signed)
Please take all new medication as prescribed - the antibiotic, and cough medicine ° °Please continue all other medications as before, and refills have been done if requested. ° °Please have the pharmacy call with any other refills you may need. ° °Please continue your efforts at being more active, low cholesterol diet, and weight control. ° °Please keep your appointments with your specialists as you may have planned ° ° ° °

## 2015-10-05 NOTE — Assessment & Plan Note (Signed)
Mild to mod, for antibx course,  to f/u any worsening symptoms or concerns 

## 2015-10-05 NOTE — Assessment & Plan Note (Signed)
stable overall by history and exam, recent data reviewed with pt, and pt to continue medical treatment as before,  to f/u any worsening symptoms or concerns SpO2 Readings from Last 3 Encounters:  09/09/15 97%  08/31/15 98%  08/28/15 98%

## 2015-10-05 NOTE — Assessment & Plan Note (Signed)
stable overall by history and exam, recent data reviewed with pt, and pt to continue medical treatment as before,  to f/u any worsening symptoms or concerns Lab Results  Component Value Date   HGBA1C 5.4 08/19/2015

## 2015-10-06 ENCOUNTER — Telehealth: Payer: Self-pay | Admitting: Internal Medicine

## 2015-10-06 MED ORDER — PREDNISONE 10 MG PO TABS: ORAL_TABLET | ORAL | Status: DC

## 2015-10-06 NOTE — Telephone Encounter (Signed)
Pt advised.

## 2015-10-06 NOTE — Telephone Encounter (Signed)
Patient Name: JERRIANNE MORINE DOB: [birth year] Initial Comment caller states she is wheezing - she is coughing to the point of having her ribs being sore- states she is also asthmatic Nurse Assessment Nurse: Ronnald Ramp, RN, Miranda Date/Time (Eastern Time): 10/06/2015 9:30:11 AM Confirm and document reason for call. If symptomatic, describe symptoms. ---Caller states she has been on antibiotics for 1 week. Still has cough and bodyaches. Also with pain in her ribs from the cough. 2 days ago started having wheezing. Proair Q12 hrs (last dose 1.5 hrs ago) Has the patient traveled out of the country within the last 30 days? ---No Does the patient have any new or worsening symptoms? ---Yes Will a triage be completed? ---Yes Related visit to physician within the last 2 weeks? ---No Does the PT have any chronic conditions? (i.e. diabetes, asthma, etc.) ---Yes List chronic conditions. ---Asthma Is this a behavioral health or substance abuse call? ---No Guidelines Guideline Title Affirmed Question Affirmed Notes Final Disposition User Clinical Call Ronnald Ramp, RN, Miranda Comments Caller refused to continue with triage. She is just wanting to speak to the doctor or his nurse. She wants steroids or a stronger antibiotic called in. Told caller I would put a note in the chart for the doctor.

## 2015-10-06 NOTE — Telephone Encounter (Signed)
Prednisone done erx

## 2015-10-13 ENCOUNTER — Ambulatory Visit: Payer: BLUE CROSS/BLUE SHIELD | Admitting: Internal Medicine

## 2015-10-16 ENCOUNTER — Encounter: Payer: Self-pay | Admitting: Vascular Surgery

## 2015-10-20 ENCOUNTER — Encounter: Payer: Self-pay | Admitting: Internal Medicine

## 2015-10-20 ENCOUNTER — Ambulatory Visit (INDEPENDENT_AMBULATORY_CARE_PROVIDER_SITE_OTHER): Payer: BLUE CROSS/BLUE SHIELD | Admitting: Internal Medicine

## 2015-10-20 VITALS — BP 122/84 | HR 68 | Temp 98.6°F | Resp 20 | Ht 65.0 in | Wt 229.0 lb

## 2015-10-20 DIAGNOSIS — J069 Acute upper respiratory infection, unspecified: Secondary | ICD-10-CM | POA: Diagnosis not present

## 2015-10-20 DIAGNOSIS — E119 Type 2 diabetes mellitus without complications: Secondary | ICD-10-CM

## 2015-10-20 DIAGNOSIS — J4521 Mild intermittent asthma with (acute) exacerbation: Secondary | ICD-10-CM | POA: Diagnosis not present

## 2015-10-20 DIAGNOSIS — J45901 Unspecified asthma with (acute) exacerbation: Secondary | ICD-10-CM | POA: Insufficient documentation

## 2015-10-20 MED ORDER — HYDROCOD POLST-CPM POLST ER 10-8 MG/5ML PO SUER: 5.0000 mL | Freq: Two times a day (BID) | ORAL | Status: DC | PRN

## 2015-10-20 MED ORDER — METHYLPREDNISOLONE ACETATE 80 MG/ML IJ SUSP
80.0000 mg | Freq: Once | INTRAMUSCULAR | Status: AC
  Administered 2015-10-20: 80 mg via INTRAMUSCULAR

## 2015-10-20 MED ORDER — PREDNISONE 10 MG PO TABS: ORAL_TABLET | ORAL | Status: DC

## 2015-10-20 NOTE — Progress Notes (Signed)
Pre visit review using our clinic review tool, if applicable. No additional management support is needed unless otherwise documented below in the visit note. 

## 2015-10-20 NOTE — Progress Notes (Signed)
Subjective:    Patient ID: Suzanne Hansen, female    DOB: [birth year], 60 y.o.   MRN: [id number]  HPI  Here to f/u, sinus symptoms have resolved from last visit, but unfortunately now has persistent  scant prod cough whitish sputum, mild to mod, no blood but wheezing worse in the past wk with mild sob/doe, No fever, ST , but Pt denies chest pain, , orthopnea, PND, increased LE swelling, palpitations, dizziness or syncope.  Has appt with cardiology in 2 days. Last cxr nov 2016 neg for acute. Pt denies new neurological symptoms such as new headache, or facial or extremity weakness or numbness   Pt denies polydipsia, polyuria,  Has also been on ACE in last 2 mo, wondering if this needs to be changed in light of the cough. OTC delsym not working Past Medical History  Diagnosis Date  . Hypoactive thyroid   . Asthma   . Allergy   . HERPES SIMPLEX, UNCOMPLICATED [id number]  . HYPERTHYROIDISM 05/23/2007  . HYPOTHYROIDISM 05/09/2008  . VITAMIN D DEFICIENCY 08/28/2009  . GLUCOSE INTOLERANCE 08/28/2009  . OBESITY 05/23/2007  . ANXIETY 05/23/2007  . SINUSITIS- ACUTE-NOS 11/03/2008  . URI 08/28/2009  . ALLERGIC RHINITIS 05/09/2008  . ASTHMA 05/23/2007  . UTI 04/29/2008  . ENDOMETRIOSIS NOS 05/23/2007  . CONTACT DERMATITIS 06/09/2009  . UNSPECIFIED URTICARIA 06/04/2009  . SHOULDER PAIN, LEFT 05/17/2010  . LATERAL EPICONDYLITIS, RIGHT 03/10/2009  . ARM PAIN, LEFT 10/23/2009  . FACIAL PAIN 06/02/2010  . Wheezing 07/12/2010  . Unspecified Chest Pain 07/25/2008  . FREQUENCY, URINARY 05/17/2010  . INSOMNIA, HX OF 05/23/2007  . LIPOMA 10/22/2010  . SKIN LESION 11/10/2010  . NECK PAIN 11/10/2010  . Impaired glucose tolerance 01/28/2011  . Diabetes mellitus   . Plantar fasciitis     Both feet  . Hyperlipidemia 08/28/2015  . Cardiomyopathy ([place]) 09/08/2015   Past Surgical History  Procedure Laterality Date  . Endometrial ablation    . Wisdom tooth extraction    . Uterine fibroid surgery    . Laminectomy  1996  .  Laparotomy      exploratory  . S/p endometrial ablation  12/06  . Tubal ligation      reports that she quit smoking about 27 years ago. Her smoking use included Cigarettes. She smoked 0.50 packs per day. She has never used smokeless tobacco. She reports that she does not drink alcohol or use illicit drugs. family history includes COPD in her father; Cancer in her maternal grandmother; Diabetes in her cousin, mother, and sister; Heart disease in her cousin and mother; Hypertension in her cousin and father; Kidney disease in her sister. Allergies  Allergen Reactions  . Ivp Dye [Iodinated Diagnostic Agents] Shortness Of Breath and Other (See Comments)    Swelling of extremity of the injection site  . Latex Itching    ITCHING AND COLD SORES AROUND MOUTH WHEN LATEX GLOVES USED BY THE DENTIST/HYGENTIST  . Clarithromycin Nausea And Vomiting    REACTION: nausea  vomiting  . Metronidazole Nausea And Vomiting   Current Outpatient Prescriptions on File Prior to Visit  Medication Sig Dispense Refill  . albuterol (PROAIR HFA) 108 (90 BASE) MCG/ACT inhaler Inhale 2 puffs into the lungs every 4 (four) hours as needed for wheezing or shortness of breath. 18 each 2  . aspirin 81 MG EC tablet Take 81 mg by mouth daily.      . Calcium Citrate-Vitamin D (CITRACAL PETITES/VITAMIN D PO) Take 1 tablet by mouth daily.    [name]  carvedilol (COREG) 3.125 MG tablet Take 1 tablet (3.125 mg total) by mouth 2 (two) times daily. 60 tablet 6  . chlorpheniramine-HYDROcodone (TUSSIONEX PENNKINETIC ER) 10-8 MG/5ML SUER Take 5 mLs by mouth every 12 (twelve) hours as needed for cough. 180 mL 0  . Cholecalciferol (VITAMIN D) 2000 UNITS CAPS Take 2 capsules by mouth daily.    . Diclofenac Sodium 2 % SOLN Apply 1 pump twice daily. 112 g 3  . estradiol (VIVELLE-DOT) 0.05 MG/24HR patch Place 1 patch onto the skin 2 (two) times a week.    . estradiol-norethindrone (MIMVEY) 1-0.5 MG per tablet Take 1 tablet by mouth daily.    . Ferrous  Sulfate (IRON) 325 (65 FE) MG TABS Take 1 tablet by mouth daily.    . fexofenadine (ALLEGRA) 180 MG tablet Take 180 mg by mouth daily.      . furosemide (LASIX) 40 MG tablet Take 1 tablet (40 mg total) by mouth daily. 30 tablet 6  . levofloxacin (LEVAQUIN) 500 MG tablet Take 1 tablet (500 mg total) by mouth daily. 10 tablet 0  . levothyroxine (SYNTHROID, LEVOTHROID) 100 MCG tablet   3  . levothyroxine (SYNTHROID, LEVOTHROID) 112 MCG tablet Take 100 mcg by mouth daily. Reported on 09/29/2015    . lisinopril (PRINIVIL,ZESTRIL) 5 MG tablet Take 1 tablet (5 mg total) by mouth daily. 30 tablet 6  . magnesium oxide (MAG-OX) 400 MG tablet Take 400 mg by mouth daily.    . mometasone (NASONEX) 50 MCG/ACT nasal spray Place 2 sprays into the nose daily. 17 g 11  . Multiple Vitamin (MULTIVITAMIN) capsule Take 1 capsule by mouth daily.      . predniSONE (DELTASONE) 10 MG tablet 3 tabs by mouth per day for 3 days,2tabs per day for 3 days,1tab per day for 3 days 18 tablet 0  . progesterone (PROMETRIUM) 100 MG capsule Take 100 mg by mouth daily.    . rosuvastatin (CRESTOR) 10 MG tablet Take 1 tablet (10 mg total) by mouth daily. 90 tablet 3  . SYMBICORT 160-4.5 MCG/ACT inhaler INHALE 2 PUFFS INTO THE LUNGS 2 (TWO) TIMES DAILY. 10.2 Inhaler 2  . Turmeric 500 MG TABS Take 500 mg by mouth daily.     No current facility-administered medications on file prior to visit.   Review of Systems  Constitutional: Negative for unusual diaphoresis or night sweats HENT: Negative for ringing in ear or discharge Eyes: Negative for double vision or worsening visual disturbance.  Respiratory: Negative for choking and stridor.   Gastrointestinal: Negative for vomiting or other signifcant bowel change Genitourinary: Negative for hematuria or change in urine volume.  Musculoskeletal: Negative for other MSK pain or swelling Skin: Negative for color change and worsening wound.  Neurological: Negative for tremors and numbness  other than noted  Psychiatric/Behavioral: Negative for decreased concentration or agitation other than above       Objective:   Physical Exam BP 122/84 mmHg  Pulse 68  Temp(Src) 98.6 F (37 C) (Oral)  Resp 20  Ht 5\' 5"  (1.651 m)  Wt 229 lb (103.874 kg)  BMI 38.11 kg/m2  SpO2  VS noted, non toxic Constitutional: Pt appears in no significant distress HENT: Head: NCAT.  Right Ear: External ear normal.  Left Ear: External ear normal.  Eyes: . Pupils are equal, round, and reactive to light. Conjunctivae and EOM are normal Neck: Normal range of motion. Neck supple.  Cardiovascular: Normal rate and regular rhythm.   Pulmonary/Chest: Effort normal and breath sounds decreased  without rales but few scattered  wheezing.  Neurological: Pt is alert. Not confused , motor grossly intact Skin: Skin is warm. No rash, no LE edema Psychiatric: Pt behavior is normal. No agitation.     Assessment & Plan:

## 2015-10-20 NOTE — Patient Instructions (Signed)
You had the steroid shot today  Please take all new medication as prescribed - the prednisone  /Please continue all other medications as before, including the inhalers  Please have the pharmacy call with any other refills you may need.  Please continue your efforts at being more active, low cholesterol diet, and weight control.  Please keep your appointments with your specialists as you may have planned - Dr Aundra Dubin on jan 12

## 2015-10-20 NOTE — Assessment & Plan Note (Signed)
stable overall by history and exam, recent data reviewed with pt, and pt to continue medical treatment as before,  to f/u any worsening symptoms or concerns Lab Results  Component Value Date   HGBA1C 5.4 08/19/2015

## 2015-10-20 NOTE — Assessment & Plan Note (Signed)
Resolved, no further antibx needed 

## 2015-10-20 NOTE — Addendum Note (Signed)
Addended by: Lyman Bishop on: 10/20/2015 05:04 PM   Modules accepted: Orders

## 2015-10-20 NOTE — Assessment & Plan Note (Signed)
Mild to mod, for depomedrol IM, predpac asd, to f/u any worsening symptoms or concerns 

## 2015-10-21 ENCOUNTER — Ambulatory Visit: Payer: BLUE CROSS/BLUE SHIELD | Admitting: Cardiovascular Disease

## 2015-10-22 ENCOUNTER — Ambulatory Visit (INDEPENDENT_AMBULATORY_CARE_PROVIDER_SITE_OTHER): Payer: BLUE CROSS/BLUE SHIELD | Admitting: Cardiovascular Disease

## 2015-10-22 ENCOUNTER — Ambulatory Visit (INDEPENDENT_AMBULATORY_CARE_PROVIDER_SITE_OTHER): Payer: BLUE CROSS/BLUE SHIELD | Admitting: Vascular Surgery

## 2015-10-22 ENCOUNTER — Encounter: Payer: Self-pay | Admitting: Cardiovascular Disease

## 2015-10-22 ENCOUNTER — Encounter: Payer: Self-pay | Admitting: Vascular Surgery

## 2015-10-22 ENCOUNTER — Ambulatory Visit (HOSPITAL_COMMUNITY)
Admission: RE | Admit: 2015-10-22 | Discharge: 2015-10-22 | Disposition: A | Discharge status: Home / Self Care | Payer: BLUE CROSS/BLUE SHIELD | Source: Ambulatory Visit | Attending: Vascular Surgery | Admitting: Vascular Surgery

## 2015-10-22 VITALS — BP 120/80 | HR 70 | Ht 65.0 in | Wt 224.0 lb

## 2015-10-22 VITALS — BP 112/76 | HR 73 | Ht 65.0 in | Wt 224.0 lb

## 2015-10-22 DIAGNOSIS — I429 Cardiomyopathy, unspecified: Secondary | ICD-10-CM

## 2015-10-22 DIAGNOSIS — I5022 Chronic systolic (congestive) heart failure: Secondary | ICD-10-CM

## 2015-10-22 DIAGNOSIS — I428 Other cardiomyopathies: Secondary | ICD-10-CM

## 2015-10-22 DIAGNOSIS — I83813 Varicose veins of bilateral lower extremities with pain: Secondary | ICD-10-CM | POA: Insufficient documentation

## 2015-10-22 DIAGNOSIS — I83893 Varicose veins of bilateral lower extremities with other complications: Secondary | ICD-10-CM

## 2015-10-22 NOTE — Progress Notes (Signed)
Chief Complaint  Patient presents with  . Cardiomyopathy    pt c/o SOB, pain in chest and coughing  . Follow-up    History of Present Illness: 60 yo female with history of non-ischemic cardiomyopathy, DM, HLD, hypothyroidism here for follow up. I saw her as a new patient for evaluation of cardiomyopathy, LE edema 09/09/15. She has chronic LE edema. Negative venous doppler left LE November 2016. NO DVT. Normal stress myoview December 2014 with no ischemia but LVEF=48% at that time. She has been followed in primary care and has been on HCTZ and recently started on Lasix for LE edema. Echo 09/08/15 with LVEF=40-45%.   She notes significant improvement in LE edema with Lasix. She denies chest pain or dyspnea on exertion. She has no dizziness, near syncope or syncope.   Primary Care Physician: Cathlean Cower, MD  Past Medical History  Diagnosis Date  . Hypoactive thyroid   . Asthma   . Allergy   . HERPES SIMPLEX, UNCOMPLICATED [id number]  . HYPERTHYROIDISM 05/23/2007  . HYPOTHYROIDISM 05/09/2008  . VITAMIN D DEFICIENCY 08/28/2009  . GLUCOSE INTOLERANCE 08/28/2009  . OBESITY 05/23/2007  . ANXIETY 05/23/2007  . SINUSITIS- ACUTE-NOS 11/03/2008  . URI 08/28/2009  . ALLERGIC RHINITIS 05/09/2008  . ASTHMA 05/23/2007  . UTI 04/29/2008  . ENDOMETRIOSIS NOS 05/23/2007  . CONTACT DERMATITIS 06/09/2009  . UNSPECIFIED URTICARIA 06/04/2009  . SHOULDER PAIN, LEFT 05/17/2010  . LATERAL EPICONDYLITIS, RIGHT 03/10/2009  . ARM PAIN, LEFT 10/23/2009  . FACIAL PAIN 06/02/2010  . Wheezing 07/12/2010  . Unspecified Chest Pain 07/25/2008  . FREQUENCY, URINARY 05/17/2010  . INSOMNIA, HX OF 05/23/2007  . LIPOMA 10/22/2010  . SKIN LESION 11/10/2010  . NECK PAIN 11/10/2010  . Impaired glucose tolerance 01/28/2011  . Diabetes mellitus   . Plantar fasciitis     Both feet  . Hyperlipidemia 08/28/2015  . Cardiomyopathy ([place]) 09/08/2015    Past Surgical History  Procedure Laterality Date  . Endometrial ablation    . Wisdom  tooth extraction    . Uterine fibroid surgery    . Laminectomy  1996  . Laparotomy      exploratory  . S/p endometrial ablation  12/06  . Tubal ligation      Current Outpatient Prescriptions  Medication Sig Dispense Refill  . albuterol (PROAIR HFA) 108 (90 BASE) MCG/ACT inhaler Inhale 2 puffs into the lungs every 4 (four) hours as needed for wheezing or shortness of breath. 18 each 2  . aspirin 81 MG EC tablet Take 81 mg by mouth daily.      . Calcium Citrate-Vitamin D (CITRACAL PETITES/VITAMIN D PO) Take 1 tablet by mouth daily.    . carvedilol (COREG) 3.125 MG tablet Take 1 tablet (3.125 mg total) by mouth 2 (two) times daily. 60 tablet 6  . Cholecalciferol (VITAMIN D) 2000 UNITS CAPS Take 2 capsules by mouth daily.    . Diclofenac Sodium 2 % SOLN Apply 1 pump twice daily. 112 g 3  . estradiol (VIVELLE-DOT) 0.05 MG/24HR patch Place 1 patch onto the skin 2 (two) times a week.    . estradiol-norethindrone (MIMVEY) 1-0.5 MG per tablet Take 1 tablet by mouth daily.    . Ferrous Sulfate (IRON) 325 (65 FE) MG TABS Take 1 tablet by mouth daily.    . fexofenadine (ALLEGRA) 180 MG tablet Take 180 mg by mouth daily.      . furosemide (LASIX) 40 MG tablet Take 1 tablet (40 mg total) by mouth daily. [place]  tablet 6  . levothyroxine (SYNTHROID, LEVOTHROID) 100 MCG tablet Take 100 mcg by mouth daily before breakfast.   3  . levothyroxine (SYNTHROID, LEVOTHROID) 112 MCG tablet Take 100 mcg by mouth daily. Reported on 09/29/2015    . lisinopril (PRINIVIL,ZESTRIL) 5 MG tablet Take 1 tablet (5 mg total) by mouth daily. 30 tablet 6  . magnesium oxide (MAG-OX) 400 MG tablet Take 400 mg by mouth daily.    . mometasone (NASONEX) 50 MCG/ACT nasal spray Place 2 sprays into the nose daily. 17 g 11  . Multiple Vitamin (MULTIVITAMIN) capsule Take 1 capsule by mouth daily.      . predniSONE (DELTASONE) 10 MG tablet 3 tabs by mouth per day for 3 days,2tabs per day for 3 days,1tab per day for 3 days 18 tablet 0  .  progesterone (PROMETRIUM) 100 MG capsule Take 100 mg by mouth daily.    . rosuvastatin (CRESTOR) 10 MG tablet Take 1 tablet (10 mg total) by mouth daily. 90 tablet 3  . SYMBICORT 160-4.5 MCG/ACT inhaler INHALE 2 PUFFS INTO THE LUNGS 2 (TWO) TIMES DAILY. 10.2 Inhaler 2  . Turmeric 500 MG TABS Take 500 mg by mouth daily.    . chlorpheniramine-HYDROcodone (TUSSIONEX) 10-8 MG/5ML SUER Take 5 mLs by mouth every 12 (twelve) hours as needed.  0   No current facility-administered medications for this visit.    Allergies  Allergen Reactions  . Ivp Dye [Iodinated Diagnostic Agents] Shortness Of Breath and Other (See Comments)    Swelling of extremity of the injection site  . Latex Itching    ITCHING AND COLD SORES AROUND MOUTH WHEN LATEX GLOVES USED BY THE DENTIST/HYGENTIST  . Clarithromycin Nausea And Vomiting    REACTION: nausea  vomiting  . Metronidazole Nausea And Vomiting    Social History   Social History  . Marital Status: Divorced    Spouse Name: N/A  . Number of Children: 3  . Years of Education: N/A   Occupational History  . [employer]   Social History Main Topics  . Smoking status: Former Smoker -- 0.50 packs/day    Types: Cigarettes    Quit date: 11/05/1987  . Smokeless tobacco: Never Used  . Alcohol Use: No     Comment: WINE  . Drug Use: No  . Sexual Activity: Not on file   Other Topics Concern  . Not on file   Social History Narrative    Family History  Problem Relation Age of Onset  . Cancer Maternal Grandmother     Breast  . Diabetes Cousin   . Hypertension Cousin   . Heart disease Cousin   . Diabetes Mother   . Heart disease Mother     Rheumatic heart disease  . COPD Father   . Hypertension Father   . Diabetes Sister   . Kidney disease Sister     Review of Systems:  As stated in the HPI and otherwise negative.   BP 120/80 mmHg  Pulse 70  Ht 5\' 5"  (1.651 m)  Wt 224 lb (101.606 kg)  BMI 37.28 kg/m2  Physical  Examination: General: Well developed, well nourished, NAD HEENT: OP clear, mucus membranes moist SKIN: warm, dry. No rashes. Neuro: No focal deficits Musculoskeletal: Muscle strength 5/5 all ext Psychiatric: Mood and affect normal Neck: No JVD, no carotid bruits, no thyromegaly, no lymphadenopathy. Lungs:Clear bilaterally, no wheezes, rhonci, crackles Cardiovascular: Regular rate and rhythm. No murmurs, gallops or rubs. Abdomen:Soft. Bowel sounds  present. Non-tender.  Extremities: Trace to 1+ bilateral lower extremity edema.   Echo 09/08/15: Left ventricle: The cavity size was normal. There was mild focal basal hypertrophy of the septum. Systolic function was mildly to moderately reduced. The estimated ejection fraction was in the range of 40% to 45%. Hypokinesis of the anteroseptal and inferoseptal myocardium. Doppler parameters are consistent with abnormal left ventricular relaxation (grade 1 diastolic dysfunction). - Right ventricle: The cavity size was normal. Wall thickness was normal. Systolic function was normal. - Tricuspid valve: There was trivial regurgitation. - Pulmonary arteries: PA peak pressure: 26 mm Hg (S). - Dyssynchrony, with contraction of the anterior and lateral walls prior to the septal and inferior walls.  EKG:  EKG is ordered today. The ekg ordered today demonstrates  NSR, rate 77 bpm. Non-specific T wave abnormality.   Recent Labs: 08/19/2015: ALT 18; Hemoglobin 15.0; Platelets 255.0; TSH 3.25 09/17/2015: BUN 16; Creat 0.97; Potassium 4.1; Sodium 140   Lipid Panel    Component Value Date/Time   CHOL 182 08/19/2015 1107   TRIG 53.0 08/19/2015 1107   HDL 73.50 08/19/2015 1107   CHOLHDL 2 08/19/2015 1107   VLDL 10.6 08/19/2015 1107   LDLCALC 98 08/19/2015 1107     Wt Readings from Last 3 Encounters:  10/22/15 224 lb (101.606 kg)  10/20/15 229 lb (103.874 kg)  09/29/15 225 lb (102.059 kg)     Other studies Reviewed: Additional  studies/ records that were reviewed today include: . Review of the above records demonstrates:    Assessment and Plan:   1. Non-ischemic cardiomyopathy: She has mild reduction LV systolic function. Normal stress test two years ago and no symptoms to suggest obstructive CAD. Will continue Coreg and Lisinopril. I will not repeat ischemic testing at this time.   2. Chronic systolic CHF/LE edema: LE edema improved on increased dose of Lasix. BMET stable after increased dose.    Current medicines are reviewed at length with the patient today.  The patient does not have concerns regarding medicines.  The following changes have been made:  no change  Labs/ tests ordered today include:   No orders of the defined types were placed in this encounter.    Disposition:   FU with me in 6  months  Signed, Lauree Chandler, MD 10/22/2015 1:10 PM    [facility] [address], [place], [place]  [address] Phone: [phone]; Fax: [phone]

## 2015-10-22 NOTE — Patient Instructions (Signed)

## 2015-10-22 NOTE — Progress Notes (Signed)
[facility] [place] HISTORY AND PHYSICAL   Referring Physician: Dr Cathlean Cower History of Present Illness:  Patient is a 60 y.o. year old female who presents for evaluation of leg swelling with varicose veins.  Other medical problems include congestive failure, obesity, glucose intolerance, asthma, cardiomyopathy all of which are currently stable.  Past Medical History  Diagnosis Date  . Hypoactive thyroid   . Asthma   . Allergy   . HERPES SIMPLEX, UNCOMPLICATED [id number]  . HYPERTHYROIDISM 05/23/2007  . HYPOTHYROIDISM 05/09/2008  . VITAMIN D DEFICIENCY 08/28/2009  . GLUCOSE INTOLERANCE 08/28/2009  . OBESITY 05/23/2007  . ANXIETY 05/23/2007  . SINUSITIS- ACUTE-NOS 11/03/2008  . URI 08/28/2009  . ALLERGIC RHINITIS 05/09/2008  . ASTHMA 05/23/2007  . UTI 04/29/2008  . ENDOMETRIOSIS NOS 05/23/2007  . CONTACT DERMATITIS 06/09/2009  . UNSPECIFIED URTICARIA 06/04/2009  . SHOULDER PAIN, LEFT 05/17/2010  . LATERAL EPICONDYLITIS, RIGHT 03/10/2009  . ARM PAIN, LEFT 10/23/2009  . FACIAL PAIN 06/02/2010  . Wheezing 07/12/2010  . Unspecified Chest Pain 07/25/2008  . FREQUENCY, URINARY 05/17/2010  . INSOMNIA, HX OF 05/23/2007  . LIPOMA 10/22/2010  . SKIN LESION 11/10/2010  . NECK PAIN 11/10/2010  . Impaired glucose tolerance 01/28/2011  . Diabetes mellitus   . Plantar fasciitis     Both feet  . Hyperlipidemia 08/28/2015  . Cardiomyopathy ([place]) 09/08/2015    Past Surgical History  Procedure Laterality Date  . Endometrial ablation    . Wisdom tooth extraction    . Uterine fibroid surgery    . Laminectomy  1996  . Laparotomy      exploratory  . S/p endometrial ablation  12/06  . Tubal ligation      Social History Social History  Substance Use Topics  . Smoking status: Former Smoker -- 0.50 packs/day    Types: Cigarettes    Quit date: 11/05/1987  . Smokeless tobacco: Never Used  . Alcohol Use: No     Comment: WINE    Family History Family History  Problem Relation Age of  Onset  . Cancer Maternal Grandmother     Breast  . Diabetes Cousin   . Hypertension Cousin   . Heart disease Cousin   . Diabetes Mother   . Heart disease Mother     Rheumatic heart disease  . COPD Father   . Hypertension Father   . Diabetes Sister   . Kidney disease Sister     Allergies  Allergies  Allergen Reactions  . Ivp Dye [Iodinated Diagnostic Agents] Shortness Of Breath and Other (See Comments)    Swelling of extremity of the injection site  . Latex Itching    ITCHING AND COLD SORES AROUND MOUTH WHEN LATEX GLOVES USED BY THE DENTIST/HYGENTIST  . Clarithromycin Nausea And Vomiting    REACTION: nausea  vomiting  . Metronidazole Nausea And Vomiting     Current Outpatient Prescriptions  Medication Sig Dispense Refill  . albuterol (PROAIR HFA) 108 (90 BASE) MCG/ACT inhaler Inhale 2 puffs into the lungs every 4 (four) hours as needed for wheezing or shortness of breath. 18 each 2  . aspirin 81 MG EC tablet Take 81 mg by mouth daily.      . Calcium Citrate-Vitamin D (CITRACAL PETITES/VITAMIN D PO) Take 1 tablet by mouth daily.    . carvedilol (COREG) 3.125 MG tablet Take 1 tablet (3.125 mg total) by mouth 2 (two) times daily. 60 tablet 6  . chlorpheniramine-HYDROcodone (TUSSIONEX) 10-8 MG/5ML SUER Take 5 mLs by  mouth every 12 (twelve) hours as needed.  0  . Cholecalciferol (VITAMIN D) 2000 UNITS CAPS Take 2 capsules by mouth daily.    . Diclofenac Sodium 2 % SOLN Apply 1 pump twice daily. 112 g 3  . estradiol (VIVELLE-DOT) 0.05 MG/24HR patch Place 1 patch onto the skin 2 (two) times a week.    . estradiol-norethindrone (MIMVEY) 1-0.5 MG per tablet Take 1 tablet by mouth daily.    . Ferrous Sulfate (IRON) 325 (65 FE) MG TABS Take 1 tablet by mouth daily.    . fexofenadine (ALLEGRA) 180 MG tablet Take 180 mg by mouth daily.      . furosemide (LASIX) 40 MG tablet Take 1 tablet (40 mg total) by mouth daily. 30 tablet 6  . lisinopril (PRINIVIL,ZESTRIL) 5 MG tablet Take 1 tablet  (5 mg total) by mouth daily. 30 tablet 6  . magnesium oxide (MAG-OX) 400 MG tablet Take 400 mg by mouth daily.    . mometasone (NASONEX) 50 MCG/ACT nasal spray Place 2 sprays into the nose daily. 17 g 11  . Multiple Vitamin (MULTIVITAMIN) capsule Take 1 capsule by mouth daily.      . predniSONE (DELTASONE) 10 MG tablet 3 tabs by mouth per day for 3 days,2tabs per day for 3 days,1tab per day for 3 days 18 tablet 0  . progesterone (PROMETRIUM) 100 MG capsule Take 100 mg by mouth daily.    . rosuvastatin (CRESTOR) 10 MG tablet Take 1 tablet (10 mg total) by mouth daily. 90 tablet 3  . SYMBICORT 160-4.5 MCG/ACT inhaler INHALE 2 PUFFS INTO THE LUNGS 2 (TWO) TIMES DAILY. 10.2 Inhaler 2  . Turmeric 500 MG TABS Take 500 mg by mouth daily.    [name] levothyroxine (SYNTHROID, LEVOTHROID) 100 MCG tablet Take 100 mcg by mouth daily before breakfast. Reported on 10/22/2015  3  . levothyroxine (SYNTHROID, LEVOTHROID) 112 MCG tablet Take 100 mcg by mouth daily. Reported on 10/22/2015     No current facility-administered medications for this visit.    ROS:   General:  No weight loss, Fever, chills  HEENT: No recent headaches, no nasal bleeding, no visual changes, no sore throat  Neurologic: No dizziness, blackouts, seizures. No recent symptoms of stroke or mini- stroke. No recent episodes of slurred speech, or temporary blindness.  Cardiac: No recent episodes of chest pain/pressure, no shortness of breath at rest.  + shortness of breath with exertion.  Denies history of atrial fibrillation or irregular heartbeat  Vascular: No history of rest pain in feet.  No history of claudication.  No history of non-healing ulcer, No history of DVT   Pulmonary: No home oxygen, no productive cough, no hemoptysis,  + asthma or wheezing  Musculoskeletal:  [ ]  Arthritis, [ ]  Low back pain,  [x ] Joint pain  Hematologic:No history of hypercoagulable state.  No history of easy bleeding.  No history of  anemia  Gastrointestinal: No hematochezia or melena,  No gastroesophageal reflux, no trouble swallowing  Urinary: [ ]  chronic Kidney disease, [ ]  on HD - [ ]  MWF or [ ]  TTHS, [ ]  Burning with urination, [ ]  Frequent urination, [ ]  Difficulty urinating;   Skin: No rashes  Psychological: No history of anxiety,  No history of depression   Physical Examination  Filed Vitals:   10/22/15 1532  BP: 112/76  Pulse: 73  Height: 5\' 5"  (1.651 m)  Weight: 224 lb (101.606 kg)  SpO2: 98%    Body mass index is 37.28  kg/(m^2).  General:  Alert and oriented, no acute distress HEENT: Normal Neck: No bruit or JVD Pulmonary: Clear to auscultation bilaterally Cardiac: Regular Rate and Rhythm without murmur Abdomen: Soft, non-tender, non-distended, no mass, obese Skin: No rash, scattered reticular and spider-type varicosities popliteal fossa bilaterally a few scattered spider-type varicosities on the anterior thigh as well. There are no ulcerations on the feet or legs. Extremity Pulses:  2+ radial, brachial, femoral, absent dorsalis pedis, posterior tibial pulses bilaterally Musculoskeletal: No deformity trace bilateral pretibial edema  Neurologic: Upper and lower extremity motor 5/5 and symmetric  DATA:  Patient had a venous duplex exam today. I reviewed and interpreted this study. This showed no evidence of DVT. She had no evidence of venous reflux bilaterally.   ASSESSMENT:  Bilateral leg swelling. Most likely she has some component of venous hypertension secondary to her obesity and possibly related to her congestive failure as well. She seems fairly well compensated currently. The small varicosities on her leg are not very bothersome to her but the leg swelling is bothersome at times. I believe she would benefit from bilateral lower extremity compression stockings to deal with these symptoms. No intervention planned for her greater saphenous veins that she does not have evidence of reflux on  duplex exam. Her stockings should give at least symptomatic relief. Physical exam also is just above some component of peripheral arterial disease as well although this seems to be asymptomatic. If she develops symptoms of rest pain or claudication down the road we could consider noninvasive arterial exam to study this further.   PLAN: Bilateral lower extremity compression stockings. Patient was measured and fitted for these today. She will follow-up with [place] on as-needed basis.  Ruta Hinds, MD [facility] [place] [facility] Office: [phone] Pager: [phone]

## 2015-11-10 ENCOUNTER — Telehealth: Payer: Self-pay | Admitting: Cardiovascular Disease

## 2015-11-10 MED ORDER — LOSARTAN POTASSIUM 50 MG PO TABS: 50.0000 mg | ORAL_TABLET | Freq: Every day | ORAL | Status: DC

## 2015-11-10 NOTE — Telephone Encounter (Signed)
Left message to call back  

## 2015-11-10 NOTE — Telephone Encounter (Signed)
Spoke with pt and gave her instructions from Dr. Angelena Form. Will send prescription for Cozaar to [facility] on [address]. She will have blood pressure checked at work and let us know if elevated or low.  She expressed concerns about her medication side effects and how it is affecting her quality of life due to fatigue, shortness of breath and dizziness.  She states she is having numbness and cold feeling in her fingers.  She is willing to try change to Cozaar and if symptoms do not improve she will also stop Coreg.  I asked her to keep [place] updated on how she is feeling.

## 2015-11-10 NOTE — Telephone Encounter (Signed)
Thanks

## 2015-11-10 NOTE — Telephone Encounter (Signed)
New Message  Pt c/o Shortness Of Breath: STAT if SOB developed within the last 24 hours or pt is noticeably SOB on the phone  1. Are you currently SOB (can you hear that pt is SOB on the phone)? NO  2. How long have you been experiencing SOB? Since she has started lisinopril and carvedilol.  3. Are you SOB when sitting or when up moving around? Moving around the house and when she walks around the house or in just working or moving around.  4.  Are you currently experiencing any other symptoms? Dizziness fatigue and a terrible cough. She states she doesn't feel like herself. When she lays down she has SOB and she coughs continually.   Pt c/o medication issue: 1. Name of Medication: Lisinopril Carvedolol 3. Are you having a reaction (difficulty breathing--STAT)?  Coughing  4. What is your medication issue? Also seems like she is gaining weight. Also taking furosemide. Not sure what the problem is. Please call back to discuss.

## 2015-11-10 NOTE — Telephone Encounter (Signed)
Spoke with pt. She reports shortness of breath, dizziness, constant cough, weight gain, depressed mood and fatigue. Also with chest pressure  when walking her dog and going up stairs. States symptoms started in November after Coreg and Lisinopril started. Cough is a dry cough. Has to use her inhaler more.  Pt feels all her symptoms are related to Coreg and Lisinopril. States she has also been treated in last few months with antibiotics and prednisone for a respiratory infection but she continues to have shortness of breath and cough.  States she thinks symptoms are worse since she saw Dr. Angelena Form on January 12,2017.  I offered her appt to have symptoms evaluated but pt feels all her symptoms are related to Lisinopril and Coreg and she does not want to come in for appt.  She is asking if Dr. Angelena Form can prescribe another medication for her.  I told pt I would send message to Dr. Angelena Form for review

## 2015-11-10 NOTE — Telephone Encounter (Signed)
We can stop Lisinopril and try Cozaar 50 mg daily. Can she check BP at home? Also, I want her on low dose beta blocker if she can tolerate. I would try the switch to Cozaar and if still feeling bad after one week, we can have her hold Coreg for a week as well. [facility]

## 2015-11-17 ENCOUNTER — Telehealth: Payer: Self-pay | Admitting: Cardiovascular Disease

## 2015-11-17 NOTE — Telephone Encounter (Signed)
Pt states her cough is improved since changing to cozaar. Pt states her BP and heart rate have been good. Pt states her abdomen and stomach seem to be bloated.  Pt states she does have some irregularity of bowel movements  and is using stool softener and that has helped. Pt states she weighs regularly, her weight is up 2 pounds in the last week.  Pt states she does have some LE edema at the end of the day, hands and face are puffy.  Pt states breathing is better since medication has been changed from lisinopril to cozaar.  Pt states she is taking lasix 40mg  daily and is asking if it needs to be adjusted.  Pt advised I will forward to Dr Angelena Form for review.

## 2015-11-17 NOTE — Telephone Encounter (Signed)
Pt advised, verbalized understanding, will call if symptoms do not improve on extra lasix for 3 days.

## 2015-11-17 NOTE — Telephone Encounter (Signed)
New Message  Pt c/o swelling: STAT is pt has developed SOB within 24 hours  1. How long have you been experiencing swelling? 1-2 weeks   2. Where is the swelling located? Stomach abdomen   3.  Are you currently taking a "fluid pill"? Yes!!1   4.  Are you currently SOB? Yes! Still still experiencing that a little bit   5.  Have you traveled recently? No   Comments: Pt states that she changed the medication it seems to be doing better. Monitoring her BP and its measuring out good. However the swelling in the stomach seems pretty bad. Coughing has cut back its not as bad either.

## 2015-11-17 NOTE — Telephone Encounter (Signed)
I would have her take Lasix 40 mg twice daily for the next three days and if edema improves, she can go back to 40 mg once per day. Thanks, chris

## 2015-11-18 ENCOUNTER — Telehealth: Payer: Self-pay | Admitting: Cardiovascular Disease

## 2015-11-18 NOTE — Telephone Encounter (Signed)
Pt called because this morning she started feeling confused, dizzy light headed, head ache. Pt states MD had changed her BP medication from Lisinopril to Cozaar. Pt thinks that the medication may be making her BP to be too low. 112/82, HR 77,109/82,HR 81,115/83 HR 77 Pt states that her BP usually runs 123456 systolic. Pt was made aware that during the OV she has had here and with his PCP her BP has been 110/ 70 to 120/ 80.  Pt states she feels better now. Pt wants for Dr. Angelena Form to know, because she was asked to checked her BP and send readings after starting the Cozaar medication.

## 2015-11-18 NOTE — Telephone Encounter (Signed)
I would encourage her to drink plenty of fluids and continue meds. Her BP looks like it is in a good range. If dizziness persists, we may need to stop the cozaar. Suzanne Hansen

## 2015-11-18 NOTE — Telephone Encounter (Signed)
New message      Pt c/o BP issue: STAT if pt c/o blurred vision, one-sided weakness or slurred speech  1. What are your last 5 BP readings? 112/82, HR 77, 109/81 HR 81, 115/83 HR 77----all bp taken this am.  Yesterday the bp readings were 126/92 HR 75, 121/81 HR 69  2. Are you having any other symptoms (ex. Dizziness, headache, blurred vision, passed out)? Feeling confused, lightheaded, headache and lft arm pain 3. What is your BP issue?  Pt is concerned about bp readings.  She does not feel good now

## 2015-11-19 NOTE — Telephone Encounter (Signed)
Pt notified of instructions from Dr. McAlhany.   

## 2015-12-15 ENCOUNTER — Other Ambulatory Visit: Payer: Self-pay | Admitting: Internal Medicine

## 2016-01-21 ENCOUNTER — Ambulatory Visit (INDEPENDENT_AMBULATORY_CARE_PROVIDER_SITE_OTHER)
Admission: RE | Admit: 2016-01-21 | Discharge: 2016-01-21 | Disposition: A | Discharge status: Home / Self Care | Payer: BLUE CROSS/BLUE SHIELD | Source: Ambulatory Visit | Attending: Family Medicine | Admitting: Family Medicine

## 2016-01-21 ENCOUNTER — Ambulatory Visit (INDEPENDENT_AMBULATORY_CARE_PROVIDER_SITE_OTHER): Payer: BLUE CROSS/BLUE SHIELD | Admitting: Family Medicine

## 2016-01-21 ENCOUNTER — Encounter: Payer: Self-pay | Admitting: Family Medicine

## 2016-01-21 VITALS — BP 130/90 | HR 84 | Ht 65.0 in | Wt 226.0 lb

## 2016-01-21 DIAGNOSIS — M25561 Pain in right knee: Secondary | ICD-10-CM

## 2016-01-21 DIAGNOSIS — M170 Bilateral primary osteoarthritis of knee: Secondary | ICD-10-CM | POA: Diagnosis not present

## 2016-01-21 DIAGNOSIS — M25562 Pain in left knee: Secondary | ICD-10-CM

## 2016-01-21 NOTE — Patient Instructions (Addendum)
Good to see you  We will get an xray downstairs 2 injections today that should help Continue the vitamins Start tart cherry extract any dose at night  See me again in 4-6 weeks

## 2016-01-21 NOTE — Progress Notes (Signed)
[facility] [address], [place] [address] Phone: [phone] Subjective:    CC: Bilateral knee pain follow-up  [id number] Suzanne Hansen is a 60 y.o. female coming in with complaint of bilateral knee pain. Patient was seen previously and had moderate arthritic changes of the medial compartment bilaterally. Patient was doing fairly well. Was last seen 4 months ago. Started having increasing pain again. Medial sign. Has been working out more regularly. Trying to lose weight. Finds it difficult though secondary to the discomfort. Patient denies any giving out on her. States though that there can be some swelling. When it does occur rates the severity is 7 out of 10. Denies any nighttime awakening. Denies any radiation down the leg.     Past Medical History  Diagnosis Date  . Hypoactive thyroid   . Asthma   . Allergy   . HERPES SIMPLEX, UNCOMPLICATED [id number]  . HYPERTHYROIDISM 05/23/2007  . HYPOTHYROIDISM 05/09/2008  . VITAMIN D DEFICIENCY 08/28/2009  . GLUCOSE INTOLERANCE 08/28/2009  . OBESITY 05/23/2007  . ANXIETY 05/23/2007  . SINUSITIS- ACUTE-NOS 11/03/2008  . URI 08/28/2009  . ALLERGIC RHINITIS 05/09/2008  . ASTHMA 05/23/2007  . UTI 04/29/2008  . ENDOMETRIOSIS NOS 05/23/2007  . CONTACT DERMATITIS 06/09/2009  . UNSPECIFIED URTICARIA 06/04/2009  . SHOULDER PAIN, LEFT 05/17/2010  . LATERAL EPICONDYLITIS, RIGHT 03/10/2009  . ARM PAIN, LEFT 10/23/2009  . FACIAL PAIN 06/02/2010  . Wheezing 07/12/2010  . Unspecified Chest Pain 07/25/2008  . FREQUENCY, URINARY 05/17/2010  . INSOMNIA, HX OF 05/23/2007  . LIPOMA 10/22/2010  . SKIN LESION 11/10/2010  . NECK PAIN 11/10/2010  . Impaired glucose tolerance 01/28/2011  . Diabetes mellitus   . Plantar fasciitis     Both feet  . Hyperlipidemia 08/28/2015  . Cardiomyopathy ([place]) 09/08/2015   Past Surgical History  Procedure Laterality Date  . Endometrial ablation    . Wisdom tooth extraction    . Uterine fibroid  surgery    . Laminectomy  1996  . Laparotomy      exploratory  . S/p endometrial ablation  12/06  . Tubal ligation     Social History  Substance Use Topics  . Smoking status: Former Smoker -- 0.50 packs/day    Types: Cigarettes    Quit date: 11/05/1987  . Smokeless tobacco: Never Used  . Alcohol Use: No     Comment: WINE   Allergies  Allergen Reactions  . Ivp Dye [Iodinated Diagnostic Agents] Shortness Of Breath and Other (See Comments)    Swelling of extremity of the injection site  . Latex Itching    ITCHING AND COLD SORES AROUND MOUTH WHEN LATEX GLOVES USED BY THE DENTIST/HYGENTIST  . Clarithromycin Nausea And Vomiting    REACTION: nausea  vomiting  . Metronidazole Nausea And Vomiting   Family History  Problem Relation Age of Onset  . Cancer Maternal Grandmother     Breast  . Diabetes Cousin   . Hypertension Cousin   . Heart disease Cousin   . Diabetes Mother   . Heart disease Mother     Rheumatic heart disease  . COPD Father   . Hypertension Father   . Diabetes Sister   . Kidney disease Sister      Past medical history, social, surgical and family history all reviewed in electronic medical record.   Review of Systems: No headache, visual changes, nausea, vomiting, diarrhea, constipation, dizziness, abdominal pain, skin rash, fevers, chills, night sweats, weight loss, swollen lymph nodes,  body aches, joint swelling, muscle aches, chest pain, shortness of breath, mood changes.   Objective Blood pressure 130/90, pulse 84, height 5\' 5"  (1.651 m), weight 226 lb (102.513 kg), SpO2 96 %.  General: No apparent distress alert and oriented x3 mood and affect normal, dressed appropriately.  HEENT: Pupils equal, extraocular movements intact  Respiratory: Patient's speak in full sentences and does not appear short of breath  Cardiovascular: 2+ pitting edema of the left lower extremity and 1+ pitting edema on the right severely tender to palpation bilaterally Skin: Warm dry  intact with no signs of infection or rash on extremities or on axial skeleton.  Abdomen: Soft nontender  Neuro: Cranial nerves II through XII are intact, neurovascularly intact in all extremities with 2+ DTRs and 2+ pulses.  Lymph: No lymphadenopathy of posterior or anterior cervical chain or axillae bilaterally.  Gait normal with good balance and coordination.  MSK:  Non tender with full range of motion and good stability and symmetric strength and tone of shoulders, elbows, wrist, hip, and ankles bilaterally.  Knee: Bilateral Normal to inspection with no erythema or effusion or obvious bony abnormalities. Moderate tenderness to palpation over the medial joint line left greater than right ROM full in flexion and extension and lower leg rotation. Ligaments with solid consistent endpoints including ACL, PCL, LCL, MCL. Negative Mcmurray's, Apley's, and Thessalonian tests. Non painful patellar compression. Patellar glide moderate crepitus. Patellar and quadriceps tendons unremarkable. Hamstring and quadriceps strength is normal.  Mild worsening from previous exam  After informed written and verbal consent, patient was seated on exam table. Right knee was prepped with alcohol swab and utilizing anterolateral approach, patient's right knee space was injected with 4:1  marcaine 0.5%: Kenalog 40mg /dL. Patient tolerated the procedure well without immediate complications.  After informed written and verbal consent, patient was seated on exam table. Left knee was prepped with alcohol swab and utilizing anterolateral approach, patient's left knee space was injected with 4:1  marcaine 0.5%: Kenalog 40mg /dL. Patient tolerated the procedure well without immediate complications.    Impression and Recommendations:     This case required medical decision making of moderate complexity.

## 2016-01-21 NOTE — Progress Notes (Signed)
Pre visit review using our clinic review tool, if applicable. No additional management support is needed unless otherwise documented below in the visit note. 

## 2016-01-21 NOTE — Assessment & Plan Note (Signed)
Bilateral injections given today. Patient tolerated the procedure well. We discussed icing regimen and home exercises. We discussed which activities to do in which ones to avoid. Patient declined any type of formal physical therapy and declined bracing today. Patient will follow-up again in 3-4 weeks. X-rays are pending. At that time if worsening symptoms she could be a candidate for viscous supplementation as well as formal physical therapy.  Spent  25 minutes with patient face-to-face and had greater than 50% of counseling including as described above in assessment and plan.

## 2016-01-25 ENCOUNTER — Telehealth: Payer: Self-pay | Admitting: Internal Medicine

## 2016-01-25 DIAGNOSIS — M170 Bilateral primary osteoarthritis of knee: Secondary | ICD-10-CM

## 2016-01-25 NOTE — Telephone Encounter (Signed)
Pt would like to talk with you regarding her knees and the injections she received last week Her best number is [phone]

## 2016-01-25 NOTE — Telephone Encounter (Signed)
Spoke to pt, she states she if feeling better after the knee injections she received last week but she would also like a referral to PT. Order entered.

## 2016-02-15 ENCOUNTER — Ambulatory Visit (INDEPENDENT_AMBULATORY_CARE_PROVIDER_SITE_OTHER): Payer: BLUE CROSS/BLUE SHIELD | Admitting: Neurology

## 2016-02-15 ENCOUNTER — Encounter: Payer: Self-pay | Admitting: Neurology

## 2016-02-15 VITALS — BP 124/84 | HR 76 | Resp 20 | Ht 64.0 in | Wt 223.0 lb

## 2016-02-15 DIAGNOSIS — R609 Edema, unspecified: Secondary | ICD-10-CM | POA: Diagnosis not present

## 2016-02-15 DIAGNOSIS — G4730 Sleep apnea, unspecified: Secondary | ICD-10-CM

## 2016-02-15 DIAGNOSIS — G4710 Hypersomnia, unspecified: Secondary | ICD-10-CM | POA: Diagnosis not present

## 2016-02-15 DIAGNOSIS — I429 Cardiomyopathy, unspecified: Secondary | ICD-10-CM | POA: Diagnosis not present

## 2016-02-15 DIAGNOSIS — J4530 Mild persistent asthma, uncomplicated: Secondary | ICD-10-CM | POA: Diagnosis not present

## 2016-02-15 DIAGNOSIS — J45909 Unspecified asthma, uncomplicated: Secondary | ICD-10-CM | POA: Insufficient documentation

## 2016-02-15 DIAGNOSIS — M25473 Effusion, unspecified ankle: Secondary | ICD-10-CM

## 2016-02-15 NOTE — Progress Notes (Signed)
SLEEP MEDICINE CLINIC   Provider:  Larey Seat, M D  Referring Provider: Biagio Borg, MD Primary Care Physician:  Cathlean Cower, MD  Chief Complaint  Patient presents with  . New Patient (Initial Visit)    snoring, never had sleep study, rm 10, alone    HPI:  Suzanne Hansen is a 60 y.o. female , seen here as a referral from Dentist DR Bennett Scrape, DMD at [facility] and [place].   Chief complaint according to patient : " My dentist thinks I may have sleep apnea".  Suzanne Hansen presents today with a history of asthma, recurrent bronchitis, hypothyroidism and weight gain, and a more recent diagnosis of cardiomyopathy. Last year she had episodes of wheezing but she saw her primary care physician Dr. Cathlean Cower. The possibility of having a nocturnal sleep disorder or sleep disordered breathing has been present for couple of years at least. One of her main concerns is that she has to have work related exposure to a lot of viruses and environments that are not good for her asthma. 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 And she filled out the questionnaire at her dentist office she stated that she has a history of asthma and osteoarthritis, hypothyroidism and cardiomyopathy, and is allergic to shellfish. She has 3 children, the pregnancies were uneventful, her periods ended in 2006 with an endometrial ablation. Previously she had a lot of heavy menstrual  bleeding and irregular bleeding and suffers also from uterine fibroids. She takes magnesium to help with muscle cramps at night especially in the legs, her cardiologist is Dr. Elvina Sidle  and he follows her cardiomyopathy since she developed leg edema. She is on Valsartan.   She knows she snores, because her throat is sore and her mouth is dry in AM, her daughters tell her she stops breathing and snores loudly. She is divorced after 25 years of marriage,  sleeps alone with her dog. College aged daughter sleeps upstairs.   Sleep habits are as follows: She usually returns home after work feeling almost ready to go to bed. She will be on her comfort close by 7 or 8 PM average bedtime is around 10 PM.  She sleeps on one pillow does not elevate the head of bed. She falls asleep on her side, but she wakes up she is often on her back. She usually falls asleep easily and she feels that she sleeps through the night some nights, others she will just wake up for no reason. Sometimes she will gasp for air or  seemingly snore herself awake. She will have one bathroom break at night on average, rarely twice.  Wakes up spontaneously at about 6.00 AM but she needs to be up by 7.AM.  No naps in daytime- work is busy. She may doze when not physically active or mentally stimulated and she does often feel sleepier when she drives, helping herself to some caffeine on the way.  Sleep medical history and family sleep history:  As a child she used to sleep with a night light on, he sometimes eating a big light. She had some nightmares the usual pre-school or fear of monsters. She has no history of sleepwalking so of true night terrors , but of enuresis.  Social history:  Divorced, 3 adult children. Caffeine - one cup in AM and rarely caffeine when driving. ETOH rare, less than one a  week. Tobacco use- in college  Sept 1977, and stopped with her first pregnancy - 5 cig a day.   Review of Systems: Out of a complete 14 system review, the patient complains of only the following symptoms, and all other reviewed systems are negative. Snoring , ankle edema. depression post partum, post divorce.  Epworth score 15 , Fatigue severity score 27  , depression score PHQ 9 - 6 point, not clinically depressed .   Social History   Social History  . Marital Status: Divorced    Spouse Name: N/A  . Number of Children: 3  . Years of Education: N/A   Occupational History  . [employer]   Social History Main Topics  . Smoking status: Former Smoker -- 0.50 packs/day    Types: Cigarettes    Quit date: 11/05/1987  . Smokeless tobacco: Never Used  . Alcohol Use: No     Comment: WINE  . Drug Use: No  . Sexual Activity: Not on file   Other Topics Concern  . Not on file   Social History Narrative    Family History  Problem Relation Age of Onset  . Cancer Maternal Grandmother     Breast  . Diabetes Cousin   . Hypertension Cousin   . Heart disease Cousin   . Diabetes Mother   . Heart disease Mother     Rheumatic heart disease  . COPD Father   . Hypertension Father   . Diabetes Sister   . Kidney disease Sister     Past Medical History  Diagnosis Date  . Hypoactive thyroid   . Asthma   . Allergy   . HERPES SIMPLEX, UNCOMPLICATED [id number]  . HYPERTHYROIDISM 05/23/2007  . HYPOTHYROIDISM 05/09/2008  . VITAMIN D DEFICIENCY 08/28/2009  . GLUCOSE INTOLERANCE 08/28/2009  . OBESITY 05/23/2007  . ANXIETY 05/23/2007  . SINUSITIS- ACUTE-NOS 11/03/2008  . URI 08/28/2009  . ALLERGIC RHINITIS 05/09/2008  . ASTHMA 05/23/2007  . UTI 04/29/2008  . ENDOMETRIOSIS NOS 05/23/2007  . CONTACT DERMATITIS 06/09/2009  . UNSPECIFIED URTICARIA 06/04/2009  . SHOULDER PAIN, LEFT 05/17/2010  . LATERAL EPICONDYLITIS, RIGHT 03/10/2009  . ARM PAIN, LEFT 10/23/2009  . FACIAL PAIN 06/02/2010  . Wheezing 07/12/2010  . Unspecified Chest Pain 07/25/2008  . FREQUENCY, URINARY 05/17/2010  . INSOMNIA, HX OF 05/23/2007  . LIPOMA 10/22/2010  . SKIN LESION 11/10/2010  . NECK PAIN 11/10/2010  . Impaired glucose tolerance 01/28/2011  . Diabetes mellitus   . Plantar fasciitis     Both feet  . Hyperlipidemia 08/28/2015  . Cardiomyopathy ([place]) 09/08/2015    Past Surgical History  Procedure Laterality Date  . Endometrial ablation    . Wisdom tooth extraction    . Uterine fibroid surgery    . Laminectomy  1996  . Laparotomy      exploratory  . S/p endometrial ablation  12/06    . Tubal ligation      Current Outpatient Prescriptions  Medication Sig Dispense Refill  . albuterol (PROAIR HFA) 108 (90 BASE) MCG/ACT inhaler Inhale 2 puffs into the lungs every 4 (four) hours as needed for wheezing or shortness of breath. 18 each 2  . aspirin 81 MG EC tablet Take 81 mg by mouth daily.      . Calcium Citrate-Vitamin D (CITRACAL PETITES/VITAMIN D PO) Take 1 tablet by mouth daily.    . carvedilol (COREG) 3.125 MG tablet Take 1 tablet (3.125 mg total) by mouth 2 (two)  times daily. 60 tablet 6  . chlorpheniramine-HYDROcodone (TUSSIONEX) 10-8 MG/5ML SUER Take 5 mLs by mouth every 12 (twelve) hours as needed.  0  . Cholecalciferol (VITAMIN D) 2000 UNITS CAPS Take 2 capsules by mouth daily.    . Diclofenac Sodium 2 % SOLN Apply 1 pump twice daily. 112 g 3  . estradiol (VIVELLE-DOT) 0.05 MG/24HR patch Place 1 patch onto the skin 2 (two) times a week.    . estradiol-norethindrone (MIMVEY) 1-0.5 MG per tablet Take 1 tablet by mouth daily.    . Ferrous Sulfate (IRON) 325 (65 FE) MG TABS Take 1 tablet by mouth daily.    . fexofenadine (ALLEGRA) 180 MG tablet Take 180 mg by mouth daily.      . furosemide (LASIX) 40 MG tablet Take 1 tablet (40 mg total) by mouth daily. 30 tablet 6  . levothyroxine (SYNTHROID, LEVOTHROID) 100 MCG tablet Take 100 mcg by mouth daily before breakfast. Reported on 10/22/2015  3  . levothyroxine (SYNTHROID, LEVOTHROID) 112 MCG tablet Take 100 mcg by mouth daily. Reported on 01/21/2016    . losartan (COZAAR) 50 MG tablet Take 1 tablet (50 mg total) by mouth daily. 30 tablet 11  . magnesium oxide (MAG-OX) 400 MG tablet Take 400 mg by mouth daily.    . mometasone (NASONEX) 50 MCG/ACT nasal spray Place 2 sprays into the nose daily. 17 g 11  . Multiple Vitamin (MULTIVITAMIN) capsule Take 1 capsule by mouth daily.      . predniSONE (DELTASONE) 10 MG tablet 3 tabs by mouth per day for 3 days,2tabs per day for 3 days,1tab per day for 3 days 18 tablet 0  . progesterone  (PROMETRIUM) 100 MG capsule Take 100 mg by mouth daily.    . rosuvastatin (CRESTOR) 10 MG tablet Take 1 tablet (10 mg total) by mouth daily. 90 tablet 3  . SYMBICORT 160-4.5 MCG/ACT inhaler INHALE 2 PUFFS INTO THE LUNGS 2 (TWO) TIMES DAILY. 10.2 Inhaler 2  . Turmeric 500 MG TABS Take 500 mg by mouth daily.     No current facility-administered medications for this visit.    Allergies as of 02/15/2016 - Review Complete 02/15/2016  Allergen Reaction Noted  . Ivp dye [iodinated diagnostic agents] Shortness Of Breath and Other (See Comments) 01/02/2013  . Latex Itching 01/02/2013  . Clarithromycin Nausea And Vomiting 09/09/2015  . Metronidazole Nausea And Vomiting 05/23/2007    Vitals: BP 124/84 mmHg  Pulse 76  Resp 20  Ht 5\' 4"  (1.626 m)  Wt 223 lb (101.152 kg)  BMI 38.26 kg/m2 Last Weight:  Wt Readings from Last 1 Encounters:  02/15/16 223 lb (101.152 kg)   [id number] mass index is 38.26 kg/(m^2).     Last Height:   Ht Readings from Last 1 Encounters:  02/15/16 5\' 4"  (1.626 m)    Physical exam:  General: The patient is awake, alert and appears not in acute distress. The patient is well groomed. Head: Normocephalic, atraumatic. Neck is supple. Mallampati 3 neck circumference: 16. Nasal airflow with congestion-, TMJ ; no click  evident . Retrognathia is seen.  Cardiovascular:  Regular rate and rhythm,  without  murmurs or carotid bruit, and without distended neck veins. Respiratory: Lungs are clear to auscultation Skin:  Without evidence of edema, or rash Trunk: BMI is elevated  The patient's posture is erect   Neurologic exam : The patient is awake and alert, oriented to place and time.   Memory subjective described as intact.  Attention span &  concentration ability appears normal.  Speech is fluent,  without  dysarthria, dysphonia or aphasia.  Mood and affect are appropriate.  Cranial nerves: Pupils are equal and briskly reactive to light. Funduscopic exam without   evidence of pallor or edema. Extraocular movements  in vertical and horizontal planes intact and without nystagmus. Visual fields by finger perimetry are intact. Hearing to finger rub intact.   Facial sensation intact to fine touch.  Facial motor strength is symmetric and tongue and uvula move midline. Shoulder shrug was symmetrical.   Motor exam:   Normal tone, muscle bulk and symmetric strength in all extremities.  Sensory:  Fine touch, pinprick and vibration were tested in all extremities. Proprioception tested in the upper extremities was normal.  Coordination: Rapid alternating movements in the fingers/hands was normal. Finger-to-nose maneuver  normal without evidence of ataxia, dysmetria or tremor.  Gait and station: Patient walks without assistive device and is able unassisted to climb up to the exam table. Strength within normal limits.  Stance is stable and normal.  Toe and hell stand were tested .Tandem gait is unfragmented.  Turns with  3 Steps. Romberg testing is negative.  Deep tendon reflexes: in the  upper and lower extremities are symmetric and intact. Babinski maneuver response is downgoing.  The patient was advised of the nature of the diagnosed sleep disorder , the treatment options and risks for general a health and wellness arising from not treating the condition.  I spent more than 40 minutes of face to face time with the patient. Greater than 50% of time was spent in counseling and coordination of care. We have discussed the diagnosis and differential and I answered the patient's questions.     Assessment:  After physical and neurologic examination, review of laboratory studies,  Personal review of imaging studies, reports of other /same  Imaging studies ,  Results of polysomnography/ neurophysiology testing and pre-existing records as far as provided in visit., my assessment is   1) Suzanne Hansen, a 25 year old mother of 3 presented today with a history of snoring and  witnessed apneas. She is also extremely daytime fatigue and sleepy. 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 Her work also is not without physical activity . She has struggled with obesity, asthma, hypothyroidism and most recently cardiomyopathy.   All these conditions can increase fatigue, sleepiness and hypoventilation. For this reason I will refer and attended sleep study for the patient with CO2 monitoring. She also has some struggles at night to fall asleep and stay asleep and not to wake up too early. We have discussed some sleep hygiene measurements, and I will also give her a brochure about insomnia and sleep hygiene. Better sleep habits may increase her ability to sleep for an hour or more without interruption. Given her cardiomyopathy she is safe to use melatonin at 3 mg she would also be safe to take children's Benadryl as a sleep inducer. I prefer the liquid form as it can be dosed down easier than breaking her tablet in half. Between 12.5 mg and 25 mg at night of Benadryl as needed may easily add 2 hours of sleep. Screen time discussed. Dog in the bed discussed.   Plan:  Treatment plan and additional workup : SPLIT with CO2,  Hypoxemia watch for asthma and cardiomyopathy.   RV after visit-    I thank the colleagues at [facility] and [facility] for their referral.  I will  keep her treatment options with dental measures in mind.   Asencion Partridge Sabino Denning MD  02/15/2016   CC: Biagio Borg, Md [place], [place] [id number]

## 2016-02-15 NOTE — Patient Instructions (Signed)

## 2016-02-16 ENCOUNTER — Other Ambulatory Visit: Payer: Self-pay | Admitting: Internal Medicine

## 2016-02-16 ENCOUNTER — Other Ambulatory Visit (INDEPENDENT_AMBULATORY_CARE_PROVIDER_SITE_OTHER): Payer: BLUE CROSS/BLUE SHIELD

## 2016-02-16 ENCOUNTER — Encounter: Payer: Self-pay | Admitting: Internal Medicine

## 2016-02-16 ENCOUNTER — Ambulatory Visit (INDEPENDENT_AMBULATORY_CARE_PROVIDER_SITE_OTHER): Payer: BLUE CROSS/BLUE SHIELD | Admitting: Internal Medicine

## 2016-02-16 VITALS — BP 124/76 | HR 64 | Temp 98.3°F | Resp 20 | Wt 225.0 lb

## 2016-02-16 DIAGNOSIS — Z23 Encounter for immunization: Secondary | ICD-10-CM | POA: Diagnosis not present

## 2016-02-16 DIAGNOSIS — Z0001 Encounter for general adult medical examination with abnormal findings: Secondary | ICD-10-CM

## 2016-02-16 DIAGNOSIS — E039 Hypothyroidism, unspecified: Secondary | ICD-10-CM | POA: Diagnosis not present

## 2016-02-16 DIAGNOSIS — J4520 Mild intermittent asthma, uncomplicated: Secondary | ICD-10-CM

## 2016-02-16 DIAGNOSIS — F411 Generalized anxiety disorder: Secondary | ICD-10-CM

## 2016-02-16 DIAGNOSIS — E119 Type 2 diabetes mellitus without complications: Secondary | ICD-10-CM

## 2016-02-16 DIAGNOSIS — R6889 Other general symptoms and signs: Secondary | ICD-10-CM

## 2016-02-16 LAB — URINALYSIS, ROUTINE W REFLEX MICROSCOPIC
BILIRUBIN URINE: NEGATIVE
HGB URINE DIPSTICK: NEGATIVE
KETONES UR: NEGATIVE
LEUKOCYTES UA: NEGATIVE
NITRITE: NEGATIVE
PH: 7 (ref 5.0–8.0)
RBC / HPF: NONE SEEN (ref 0–?)
Specific Gravity, Urine: 1.01 (ref 1.000–1.030)
TOTAL PROTEIN, URINE-UPE24: NEGATIVE
UROBILINOGEN UA: 0.2 (ref 0.0–1.0)
Urine Glucose: NEGATIVE
WBC, UA: NONE SEEN (ref 0–?)

## 2016-02-16 LAB — HEPATIC FUNCTION PANEL
ALBUMIN: 4.3 g/dL (ref 3.5–5.2)
ALT: 27 U/L (ref 0–35)
AST: 21 U/L (ref 0–37)
Alkaline Phosphatase: 66 U/L (ref 39–117)
Bilirubin, Direct: 0.1 mg/dL (ref 0.0–0.3)
TOTAL PROTEIN: 7.4 g/dL (ref 6.0–8.3)
Total Bilirubin: 0.6 mg/dL (ref 0.2–1.2)

## 2016-02-16 LAB — HEMOGLOBIN A1C: HEMOGLOBIN A1C: 5.6 % (ref 4.6–6.5)

## 2016-02-16 LAB — CBC WITH DIFFERENTIAL/PLATELET
BASOS ABS: 0.1 10*3/uL (ref 0.0–0.1)
Basophils Relative: 0.6 % (ref 0.0–3.0)
EOS PCT: 0.8 % (ref 0.0–5.0)
Eosinophils Absolute: 0.1 10*3/uL (ref 0.0–0.7)
HCT: 41.2 % (ref 36.0–46.0)
HEMOGLOBIN: 13.9 g/dL (ref 12.0–15.0)
Lymphocytes Relative: 29.3 % (ref 12.0–46.0)
Lymphs Abs: 2.4 10*3/uL (ref 0.7–4.0)
MCHC: 33.8 g/dL (ref 30.0–36.0)
MCV: 92 fl (ref 78.0–100.0)
MONOS PCT: 8.7 % (ref 3.0–12.0)
Monocytes Absolute: 0.7 10*3/uL (ref 0.1–1.0)
NEUTROS PCT: 60.6 % (ref 43.0–77.0)
Neutro Abs: 5 10*3/uL (ref 1.4–7.7)
Platelets: 276 10*3/uL (ref 150.0–400.0)
RBC: 4.48 Mil/uL (ref 3.87–5.11)
RDW: 12.7 % (ref 11.5–15.5)
WBC: 8.2 10*3/uL (ref 4.0–10.5)

## 2016-02-16 LAB — BASIC METABOLIC PANEL
BUN: 20 mg/dL (ref 6–23)
CALCIUM: 10.1 mg/dL (ref 8.4–10.5)
CO2: 33 meq/L — AB (ref 19–32)
CREATININE: 1.14 mg/dL (ref 0.40–1.20)
Chloride: 100 mEq/L (ref 96–112)
GFR: 62.77 mL/min (ref >60.00)
GLUCOSE: 95 mg/dL (ref 70–99)
Potassium: 4 mEq/L (ref 3.5–5.1)
Sodium: 140 mEq/L (ref 135–145)

## 2016-02-16 LAB — T4, FREE: FREE T4: 0.81 ng/dL (ref 0.60–1.60)

## 2016-02-16 LAB — LIPID PANEL
Cholesterol: 127 mg/dL (ref 0–200)
HDL: 78.2 mg/dL (ref >39.00)
LDL CALC: 39 mg/dL (ref 0–99)
NonHDL: 48.94
Total CHOL/HDL Ratio: 2
Triglycerides: 49 mg/dL (ref 0.0–149.0)
VLDL: 9.8 mg/dL (ref 0.0–40.0)

## 2016-02-16 LAB — TSH: TSH: 15.92 u[IU]/mL — AB (ref 0.35–4.50)

## 2016-02-16 LAB — MICROALBUMIN / CREATININE URINE RATIO
CREATININE, U: 47.8 mg/dL
MICROALB/CREAT RATIO: 1.5 mg/g (ref 0.0–30.0)
Microalb, Ur: <0.7 mg/dL (ref 0.0–1.9)

## 2016-02-16 MED ORDER — LEVOTHYROXINE SODIUM 125 MCG PO TABS: 125.0000 ug | ORAL_TABLET | Freq: Every day | ORAL | Status: DC

## 2016-02-16 NOTE — Patient Instructions (Signed)
You had the Prevnar-13 pneumonia shot, and the tetanus (Tdap) shot today  Please continue all other medications as before, and refills have been done if requested.  Please have the pharmacy call with any other refills you may need.  Please continue your efforts at being more active, low cholesterol diet, and weight control.  You are otherwise up to date with prevention measures today.  Please keep your appointments with your specialists as you may have planned  Please go to the LAB in the Basement (turn left off the elevator) for the tests to be done today  You will be contacted by phone if any changes need to be made immediately.  Otherwise, you will receive a letter about your results with an explanation, but please check with MyChart first.  Please remember to sign up for MyChart if you have not done so, as this will be important to you in the future with finding out test results, communicating by private email, and scheduling acute appointments online when needed.  Please return in 6 months, or sooner if needed, with Lab testing done 3-5 days before

## 2016-02-16 NOTE — Assessment & Plan Note (Signed)
stable overall by history and exam, recent data reviewed with pt, and pt to continue medical treatment as before,  to f/u any worsening symptoms or concerns Lab Results  Component Value Date   HGBA1C 5.4 08/19/2015

## 2016-02-16 NOTE — Addendum Note (Signed)
Addended by: Della Goo C on: 02/16/2016 10:13 AM   Modules accepted: Orders

## 2016-02-16 NOTE — Assessment & Plan Note (Signed)

## 2016-02-16 NOTE — Assessment & Plan Note (Signed)
stable overall by history and exam, recent data reviewed with pt, and pt to continue medical treatment as before,  to f/u any worsening symptoms or concerns SpO2 Readings from Last 3 Encounters:  02/16/16 92%  01/21/16 96%  10/22/15 98%

## 2016-02-16 NOTE — Progress Notes (Signed)
Subjective:    Patient ID: Loette Fauerbach, female    DOB: 09/08/1956, 60 y.o.   MRN: [id number]  HPI  Here for wellness and f/u;  Overall doing ok;  Pt denies Chest pain, worsening SOB, DOE, wheezing, orthopnea, PND, worsening LE edema, palpitations, dizziness or syncope.  Pt denies neurological change such as new headache, facial or extremity weakness.  Pt denies polydipsia, polyuria, or low sugar symptoms. Pt states overall good compliance with treatment and medications, good tolerability, and has been trying to follow appropriate diet.  Pt denies worsening depressive symptoms, suicidal ideation or panic. No fever, night sweats, wt loss, loss of appetite, or other constitutional symptoms.  Pt states good ability with ADL's, has low fall risk, home safety reviewed and adequate, no other significant changes in hearing or vision, and only occasionally active with exercise.  Has significant daytime fatigue, feels jittery at times, believes she has lost wt when started taking at night (but not seen on wts here); Denies hyper or hypo thyroid symptoms such as voice, skin or hair change, but has had several hot flashes despite the estrogen patch.. Wt Readings from Last 3 Encounters:  02/16/16 225 lb (102.059 kg)  02/15/16 223 lb (101.152 kg)  01/21/16 226 lb (102.513 kg)  Has seen neurology yesterday, for osa evaluation. Past Medical History  Diagnosis Date  . Hypoactive thyroid   . Asthma   . Allergy   . HERPES SIMPLEX, UNCOMPLICATED [id number]  . HYPERTHYROIDISM 05/23/2007  . HYPOTHYROIDISM 05/09/2008  . VITAMIN D DEFICIENCY 08/28/2009  . GLUCOSE INTOLERANCE 08/28/2009  . OBESITY 05/23/2007  . ANXIETY 05/23/2007  . SINUSITIS- ACUTE-NOS 11/03/2008  . URI 08/28/2009  . ALLERGIC RHINITIS 05/09/2008  . ASTHMA 05/23/2007  . UTI 04/29/2008  . ENDOMETRIOSIS NOS 05/23/2007  . CONTACT DERMATITIS 06/09/2009  . UNSPECIFIED URTICARIA 06/04/2009  . SHOULDER PAIN, LEFT 05/17/2010  . LATERAL EPICONDYLITIS, RIGHT  03/10/2009  . ARM PAIN, LEFT 10/23/2009  . FACIAL PAIN 06/02/2010  . Wheezing 07/12/2010  . Unspecified Chest Pain 07/25/2008  . FREQUENCY, URINARY 05/17/2010  . INSOMNIA, HX OF 05/23/2007  . LIPOMA 10/22/2010  . SKIN LESION 11/10/2010  . NECK PAIN 11/10/2010  . Impaired glucose tolerance 01/28/2011  . Diabetes mellitus   . Plantar fasciitis     Both feet  . Hyperlipidemia 08/28/2015  . Cardiomyopathy ([place]) 09/08/2015   Past Surgical History  Procedure Laterality Date  . Endometrial ablation    . Wisdom tooth extraction    . Uterine fibroid surgery    . Laminectomy  1996  . Laparotomy      exploratory  . S/p endometrial ablation  12/06  . Tubal ligation      reports that she quit smoking about 28 years ago. Her smoking use included Cigarettes. She smoked 0.50 packs per day. She has never used smokeless tobacco. She reports that she does not drink alcohol or use illicit drugs. family history includes COPD in her father; Cancer in her maternal grandmother; Diabetes in her cousin, mother, and sister; Heart disease in her cousin and mother; Hypertension in her cousin and father; Kidney disease in her sister. Allergies  Allergen Reactions  . Ivp Dye [Iodinated Diagnostic Agents] Shortness Of Breath and Other (See Comments)    Swelling of extremity of the injection site  . Latex Itching    ITCHING AND COLD SORES AROUND MOUTH WHEN LATEX GLOVES USED BY THE DENTIST/HYGENTIST  . Clarithromycin Nausea And Vomiting    REACTION: nausea  vomiting  .  Metronidazole Nausea And Vomiting   Current Outpatient Prescriptions on File Prior to Visit  Medication Sig Dispense Refill  . aspirin 81 MG EC tablet Take 81 mg by mouth daily.      . Calcium Citrate-Vitamin D (CITRACAL PETITES/VITAMIN D PO) Take 1 tablet by mouth daily.    . carvedilol (COREG) 3.125 MG tablet Take 1 tablet (3.125 mg total) by mouth 2 (two) times daily. 60 tablet 6  . Cholecalciferol (VITAMIN D) 2000 UNITS CAPS Take 2 capsules by mouth  daily.    . Diclofenac Sodium 2 % SOLN Apply 1 pump twice daily. 112 g 3  . estradiol (VIVELLE-DOT) 0.05 MG/24HR patch Place 1 patch onto the skin 2 (two) times a week.    . Ferrous Sulfate (IRON) 325 (65 FE) MG TABS Take 1 tablet by mouth daily.    . fexofenadine (ALLEGRA) 180 MG tablet Take 180 mg by mouth daily.      . furosemide (LASIX) 40 MG tablet Take 1 tablet (40 mg total) by mouth daily. 30 tablet 6  . levothyroxine (SYNTHROID, LEVOTHROID) 100 MCG tablet Take 100 mcg by mouth daily before breakfast. Reported on 10/22/2015  3  . losartan (COZAAR) 50 MG tablet Take 1 tablet (50 mg total) by mouth daily. 30 tablet 11  . magnesium oxide (MAG-OX) 400 MG tablet Take 400 mg by mouth daily.    . Multiple Vitamin (MULTIVITAMIN) capsule Take 1 capsule by mouth daily.      . progesterone (PROMETRIUM) 100 MG capsule Take 100 mg by mouth daily.    . rosuvastatin (CRESTOR) 10 MG tablet Take 1 tablet (10 mg total) by mouth daily. 90 tablet 3  . SYMBICORT 160-4.5 MCG/ACT inhaler INHALE 2 PUFFS INTO THE LUNGS 2 (TWO) TIMES DAILY. 10.2 Inhaler 2  . Turmeric 500 MG TABS Take 500 mg by mouth daily.     No current facility-administered medications on file prior to visit.   Review of Systems Constitutional: Negative for increased diaphoresis, or other activity, appetite or siginficant weight change other than noted HENT: Negative for worsening hearing loss, ear pain, facial swelling, mouth sores and neck stiffness.   Eyes: Negative for other worsening pain, redness or visual disturbance.  Respiratory: Negative for choking or stridor Cardiovascular: Negative for other chest pain and palpitations.  Gastrointestinal: Negative for worsening diarrhea, blood in stool, or abdominal distention Genitourinary: Negative for hematuria, flank pain or change in urine volume.  Musculoskeletal: Negative for myalgias or other joint complaints.  Skin: Negative for other color change and wound or drainage.  Neurological:  Negative for syncope and numbness. other than noted Hematological: Negative for adenopathy. or other swelling Psychiatric/Behavioral: Negative for hallucinations, SI, self-injury, decreased concentration or other worsening agitation.      Objective:   Physical Exam BP 124/76 mmHg  Pulse 64  Temp(Src) 98.3 F (36.8 C) (Oral)  Resp 20  Wt 225 lb (102.059 kg)  SpO2 92% VS noted,  Constitutional: Pt is oriented to person, place, and time. Appears well-developed and well-nourished, in no significant distress Head: Normocephalic and atraumatic  Eyes: Conjunctivae and EOM are normal. Pupils are equal, round, and reactive to light Right Ear: External ear normal.  Left Ear: External ear normal Nose: Nose normal.  Mouth/Throat: Oropharynx is clear and moist  Neck: Normal range of motion. Neck supple. No JVD present. No tracheal deviation present or significant neck LA or mass Cardiovascular: Normal rate, regular rhythm, normal heart sounds and intact distal pulses.   Pulmonary/Chest: Effort normal  and breath sounds without rales or wheezing  Abdominal: Soft. Bowel sounds are normal. NT. No HSM  Musculoskeletal: Normal range of motion. Exhibits no edema Lymphadenopathy: Has no cervical adenopathy.  Neurological: Pt is alert and oriented to person, place, and time. Pt has normal reflexes. No cranial nerve deficit. Motor grossly intact Skin: Skin is warm and dry. No rash noted or new ulcers Psychiatric:  Has normal mood and affect. Behavior is normal. 1+ nervous    Assessment & Plan:

## 2016-02-16 NOTE — Assessment & Plan Note (Addendum)
Pt quite concerned about thryoid level due to perceived health concerns, suspect element of anxiety, but will checkk TSH/free t4,  to f/u any worsening symptoms or concerns Lab Results  Component Value Date   TSH 3.25 08/19/2015   In addition to the time spent performing CPE, I spent an additional 40 minutes face to face,in which greater than 50% of this time was spent in counseling and coordination of care for patient's acute illness as documented.

## 2016-02-16 NOTE — Assessment & Plan Note (Signed)
Mild , declines need for tx or counseling,  to f/u any worsening symptoms or concerns

## 2016-02-16 NOTE — Progress Notes (Signed)
Pre visit review using our clinic review tool, if applicable. No additional management support is needed unless otherwise documented below in the visit note. 

## 2016-02-29 ENCOUNTER — Telehealth: Payer: Self-pay | Admitting: *Deleted

## 2016-02-29 ENCOUNTER — Ambulatory Visit (INDEPENDENT_AMBULATORY_CARE_PROVIDER_SITE_OTHER): Payer: BLUE CROSS/BLUE SHIELD | Admitting: Neurology

## 2016-02-29 DIAGNOSIS — M25473 Effusion, unspecified ankle: Secondary | ICD-10-CM

## 2016-02-29 DIAGNOSIS — G4710 Hypersomnia, unspecified: Secondary | ICD-10-CM

## 2016-02-29 DIAGNOSIS — G4730 Sleep apnea, unspecified: Principal | ICD-10-CM

## 2016-02-29 DIAGNOSIS — G4733 Obstructive sleep apnea (adult) (pediatric): Secondary | ICD-10-CM

## 2016-02-29 DIAGNOSIS — I429 Cardiomyopathy, unspecified: Secondary | ICD-10-CM

## 2016-02-29 DIAGNOSIS — J4530 Mild persistent asthma, uncomplicated: Secondary | ICD-10-CM

## 2016-02-29 NOTE — Telephone Encounter (Signed)
Message For: OFFICE               Taken 22-MAY-17 at  2:27PM by [facility] ------------------------------------------------------------ Vivianne Master             CID [id number]  Patient SAME                 Pt's Dr [facility]      Area Code 336 Phone# [phone]     RE CONFIRMING APPT 5/22 @ 930PM, FOR A SLEEP         STUDY, PLS C/B WITH CONFIRMATION                     Disp:Y/N N If Y = C/B If No Response In 6minutes ============================================================

## 2016-03-10 ENCOUNTER — Telehealth: Payer: Self-pay

## 2016-03-10 ENCOUNTER — Other Ambulatory Visit: Payer: Self-pay

## 2016-03-10 ENCOUNTER — Telehealth: Payer: Self-pay | Admitting: Family Medicine

## 2016-03-10 DIAGNOSIS — G4733 Obstructive sleep apnea (adult) (pediatric): Secondary | ICD-10-CM

## 2016-03-10 DIAGNOSIS — M25562 Pain in left knee: Principal | ICD-10-CM

## 2016-03-10 DIAGNOSIS — M25561 Pain in right knee: Secondary | ICD-10-CM

## 2016-03-10 MED ORDER — MELOXICAM 15 MG PO TABS: 15.0000 mg | ORAL_TABLET | Freq: Every day | ORAL | Status: DC

## 2016-03-10 NOTE — Telephone Encounter (Signed)
Patient Name: Suzanne Hansen  DOB: [birth year]    Initial Comment needs second attempt 4 Caller states c/o bilateral knee pain, left is worse   Nurse Assessment  Nurse: Raphael Gibney, RN, Vanita Ingles Date/Time (Eastern Time): 03/10/2016 11:32:47 AM  Confirm and document reason for call. If symptomatic, describe symptoms. You must click the next button to save text entered. ---Caller states she has pain in both knees. Left knee is worse. Had a cortisone injection a month or so ago. She can walk. Seems to be swollen. Left knee is more swollen.  Has the patient traveled out of the country within the last 30 days? ---Not Applicable  Does the patient have any new or worsening symptoms? ---Yes  Will a triage be completed? ---Yes  Related visit to physician within the last 2 weeks? ---No  Does the PT have any chronic conditions? (i.e. diabetes, asthma, etc.) ---Yes  List chronic conditions. ---arthritis, cardiomyepathy  Is this a behavioral health or substance abuse call? ---No     Guidelines    Guideline Title Affirmed Question Affirmed Notes  Knee Pain [1] Swollen joint AND [2] no fever or redness    Final Disposition User   See PCP When Office is Open (within 3 days) Raphael Gibney, Therapist, sports, Vanita Ingles    Comments  Caller states she has to go out of town for work in a couple of hrs and is unable to come to the office for an appt. She wants to know if she can get medication called in. Please call pt back before she leaves town and let her know about the medication.   Referrals  GO TO FACILITY REFUSED   Disagree/Comply: Disagree  Disagree/Comply Reason: Disagree with instructions

## 2016-03-10 NOTE — Telephone Encounter (Signed)
I spoke to patient and she is aware of sleep study results. (OSA) She is willing to start CPAP treatment at home. I will send orders to [facility]. I have faxed report to PCP and referring doctor. Patient was unable to make f/u at this time but she will get a letter to remind her to make appt.

## 2016-03-10 NOTE — Telephone Encounter (Signed)
Spoke with patient. Will send in Meloxicam to patients pharmacy. 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

## 2016-03-17 ENCOUNTER — Telehealth: Payer: Self-pay

## 2016-03-17 NOTE — Telephone Encounter (Signed)
I returned pt's call. No answer, left a message asking her to call me back.

## 2016-03-17 NOTE — Telephone Encounter (Signed)
Pt's sleep study revealed osa with a strong REM component, REM AHI was 57/hr.  Dr. Brett Fairy recommended pt start cpap at [place].  I'm not sure that there are any other options for her to treat the REM AHI.   Dr. Brett Fairy, any suggestions for this pt that can't afford cpap until she meets her deductible?

## 2016-03-17 NOTE — Telephone Encounter (Signed)
I spoke to pt. She is asking that I send the order for the cpap to [facility] because [facility] is "too expensive". I advised her that I will send the order to [facility]. She is asking for a copy of her last office visit note with Dr. Brett Fairy and her sleep study. I advised her that before she can get these, she must come to the office and sign a MR release form. Pt verbalized understanding. I placed last OV note and sleep study results up front for pt to pick up after she signs a MR release.

## 2016-03-17 NOTE — Telephone Encounter (Addendum)
Pt called requesting RX be sent to [facility] .pt could not pay the money [facility] was asking.  May call pt at [phone] Pt is also requesting copy of last office notes

## 2016-03-17 NOTE — Telephone Encounter (Signed)
Potentially option of a refurbished CPAP machine, but I [facility] is offering a discount for patients meeting them at the online price for a new CPAP machine.

## 2016-03-17 NOTE — Telephone Encounter (Signed)
[place] said that [facility] can't help her until she has met her insurance deductible.  She wants to know if there are any other options for her CPAP device.

## 2016-03-17 NOTE — Telephone Encounter (Signed)
See previous documentation. [facility] emailed me this information regarding this pt.  "When I went over Financials with this patient, she told me that I was wasting my time that she could not afford this. She then hung up on me before I could go over [place] and used machine bundle options. I just wanted you to be aware. Please let me know if there is anything else I can do to assist with this patient. Thank you :)"  Pt then called our office to discuss other options than cpap. However, her REM AHI is 57/hr, and cpap was recommended by Dr. Brett Fairy.  Any other recommendations for this pt to treat her osa with strong REM component until she meets her deductible?

## 2016-03-21 NOTE — Telephone Encounter (Signed)
I have sent a message to [facility] asking if a refurbished cpap is an option for this pt if she cannot afford a new cpap.

## 2016-03-23 ENCOUNTER — Other Ambulatory Visit: Payer: Self-pay | Admitting: Cardiovascular Disease

## 2016-03-28 ENCOUNTER — Other Ambulatory Visit: Payer: Self-pay | Admitting: Internal Medicine

## 2016-03-28 ENCOUNTER — Telehealth: Payer: Self-pay

## 2016-03-28 DIAGNOSIS — G4733 Obstructive sleep apnea (adult) (pediatric): Secondary | ICD-10-CM

## 2016-03-28 NOTE — Telephone Encounter (Signed)
Spoke with patient regarding mini cpap. Advised her against buying this machine because we knew nothing about it and would not have a  Download to read. I told her I have machine she can have that was donated to [place]. She will pay for her supplies and would like to use [place]. Can you send script for Res med Airfit P10 small with tubing. Not heated, its more expensive. I will get machine set and she will come by tomorrow to pick up. Thanks

## 2016-03-28 NOTE — Telephone Encounter (Signed)
Order sent to [facility] °

## 2016-03-28 NOTE — Telephone Encounter (Signed)
Patient called back regarding CPAP, states CPAP machine is too much money, states she heard an advertisement regarding mini CPAP and would like to talk to someone about this. Please call [phone].

## 2016-04-08 ENCOUNTER — Telehealth: Payer: Self-pay | Admitting: Neurology

## 2016-04-08 NOTE — Telephone Encounter (Signed)
Pt called in complaining about her CPAP. She started using it this week and says that she is still having interrupted sleep. She wants to know what she should do. Please call and advise

## 2016-04-08 NOTE — Telephone Encounter (Signed)
Spoke to pt.  She is using cpap machine that she go from the sleep lab.  She is not sleeping thru night.  She is congested, since using as well as dry mouth.  Dr. Brett Fairy and Cyril Mourning out of office will address with Dr. Rexene Alberts and or sleep lab.

## 2016-04-08 NOTE — Telephone Encounter (Signed)
Please call patient back: For congestion: Please try using a salt water nasal rinse system, such as the Neti Pot and follow the directions and try before bedtime, not immediately before. It may help your nasal congestion and help you tolerate your CPAP.  Increase the humidity of your machine.   You can try Melatonin at night for sleep: take 1 mg to 3 mg, one to 2 hours before your bedtime. You can go up to 5 mg if needed. It is over the counter and comes in pill form, chewable form and spray, if you prefer.

## 2016-04-11 ENCOUNTER — Other Ambulatory Visit: Payer: Self-pay | Admitting: *Deleted

## 2016-04-11 MED ORDER — CARVEDILOL 3.125 MG PO TABS: 3.1250 mg | ORAL_TABLET | Freq: Two times a day (BID) | ORAL | Status: DC

## 2016-04-11 NOTE — Telephone Encounter (Signed)
I spoke to patient and gave advice below. Patient also feels that her mask is too big. She is calling [facility] to see about getting a different mask.

## 2016-05-06 ENCOUNTER — Other Ambulatory Visit: Payer: Self-pay | Admitting: Family Medicine

## 2016-05-06 ENCOUNTER — Other Ambulatory Visit: Payer: Self-pay | Admitting: Internal Medicine

## 2016-05-09 ENCOUNTER — Other Ambulatory Visit (INDEPENDENT_AMBULATORY_CARE_PROVIDER_SITE_OTHER): Payer: BLUE CROSS/BLUE SHIELD

## 2016-05-09 DIAGNOSIS — E039 Hypothyroidism, unspecified: Secondary | ICD-10-CM

## 2016-05-09 LAB — T4, FREE: FREE T4: 1.15 ng/dL (ref 0.60–1.60)

## 2016-05-09 LAB — TSH: TSH: 1.23 u[IU]/mL (ref 0.35–4.50)

## 2016-05-16 ENCOUNTER — Other Ambulatory Visit: Payer: Self-pay | Admitting: *Deleted

## 2016-05-16 MED ORDER — DICLOFENAC SODIUM 2 % TD SOLN: TRANSDERMAL | 1 refills | Status: DC

## 2016-05-16 NOTE — Telephone Encounter (Signed)
Refill for pennsaid sent to pharmacy.

## 2016-05-18 ENCOUNTER — Encounter: Payer: Self-pay | Admitting: Cardiovascular Disease

## 2016-06-02 ENCOUNTER — Ambulatory Visit (INDEPENDENT_AMBULATORY_CARE_PROVIDER_SITE_OTHER): Payer: BLUE CROSS/BLUE SHIELD | Admitting: Cardiovascular Disease

## 2016-06-02 VITALS — BP 118/60 | HR 74 | Ht 64.5 in | Wt 223.8 lb

## 2016-06-02 DIAGNOSIS — I429 Cardiomyopathy, unspecified: Secondary | ICD-10-CM

## 2016-06-02 DIAGNOSIS — I5022 Chronic systolic (congestive) heart failure: Secondary | ICD-10-CM

## 2016-06-02 DIAGNOSIS — I428 Other cardiomyopathies: Secondary | ICD-10-CM

## 2016-06-02 MED ORDER — CARVEDILOL 3.125 MG PO TABS: 3.1250 mg | ORAL_TABLET | Freq: Two times a day (BID) | ORAL | 3 refills | Status: DC

## 2016-06-02 MED ORDER — LOSARTAN POTASSIUM 50 MG PO TABS: 50.0000 mg | ORAL_TABLET | Freq: Every day | ORAL | 3 refills | Status: DC

## 2016-06-02 MED ORDER — FUROSEMIDE 40 MG PO TABS: ORAL_TABLET | ORAL | 3 refills | Status: DC

## 2016-06-02 NOTE — Patient Instructions (Signed)

## 2016-06-02 NOTE — Progress Notes (Signed)
Chief Complaint  Patient presents with  . Cardiomyopathy    History of Present Illness: 60 yo female with history of non-ischemic cardiomyopathy, DM, HLD, hypothyroidism here for follow up. I saw her as a new patient for evaluation of cardiomyopathy, LE edema 09/09/15. She has chronic LE edema. Negative venous doppler left LE November 2016. NO DVT. Normal stress myoview December 2014 with no ischemia but LVEF=48% at that time. She has been followed in primary care and has been on HCTZ and recently started on Lasix for LE edema. Echo 09/08/15 with LVEF=40-45%. Lisinopril changed to Cozaar by phone call January 2017.   She is here today for follow up. She denies chest pain or dyspnea on exertion. She has no dizziness, near syncope or syncope.   Primary Care Physician: Cathlean Cower, MD  Past Medical History:  Diagnosis Date  . ALLERGIC RHINITIS 05/09/2008  . Allergy   . ANXIETY 05/23/2007  . ARM PAIN, LEFT 10/23/2009  . Asthma   . ASTHMA 05/23/2007  . Cardiomyopathy ([place]) 09/08/2015  . CONTACT DERMATITIS 06/09/2009  . Diabetes mellitus   . ENDOMETRIOSIS NOS 05/23/2007  . FACIAL PAIN 06/02/2010  . FREQUENCY, URINARY 05/17/2010  . GLUCOSE INTOLERANCE 08/28/2009  . HERPES SIMPLEX, UNCOMPLICATED [id number]  . Hyperlipidemia 08/28/2015  . HYPERTHYROIDISM 05/23/2007  . Hypoactive thyroid   . HYPOTHYROIDISM 05/09/2008  . Impaired glucose tolerance 01/28/2011  . INSOMNIA, HX OF 05/23/2007  . LATERAL EPICONDYLITIS, RIGHT 03/10/2009  . LIPOMA 10/22/2010  . NECK PAIN 11/10/2010  . OBESITY 05/23/2007  . Plantar fasciitis    Both feet  . SHOULDER PAIN, LEFT 05/17/2010  . SINUSITIS- ACUTE-NOS 11/03/2008  . SKIN LESION 11/10/2010  . Unspecified Chest Pain 07/25/2008  . UNSPECIFIED URTICARIA 06/04/2009  . URI 08/28/2009  . UTI 04/29/2008  . VITAMIN D DEFICIENCY 08/28/2009  . Wheezing 07/12/2010    Past Surgical History:  Procedure Laterality Date  . ENDOMETRIAL ABLATION    . LAMINECTOMY  1996  .  LAPAROTOMY     exploratory  . s/p endometrial ablation  12/06  . TUBAL LIGATION    . UTERINE FIBROID SURGERY    . WISDOM TOOTH EXTRACTION      Current Outpatient Prescriptions  Medication Sig Dispense Refill  . aspirin 81 MG EC tablet Take 81 mg by mouth daily.      . Calcium Citrate-Vitamin D (CITRACAL PETITES/VITAMIN D PO) Take 1 tablet by mouth daily.    . carvedilol (COREG) 3.125 MG tablet Take 1 tablet (3.125 mg total) by mouth 2 (two) times daily. 180 tablet 3  . Cholecalciferol (VITAMIN D) 2000 UNITS CAPS Take 2 capsules by mouth daily.    . Diclofenac Sodium 2 % SOLN Place 1 application onto the skin 2 (two) times daily.    [name] estradiol (VIVELLE-DOT) 0.05 MG/24HR patch Place 1 patch onto the skin 2 (two) times a week.    . Ferrous Sulfate (IRON) 325 (65 FE) MG TABS Take 1 tablet by mouth daily.    . fexofenadine (ALLEGRA) 180 MG tablet Take 180 mg by mouth daily.      . furosemide (LASIX) 40 MG tablet TAKE 1 TABLET (40 MG TOTAL) BY MOUTH DAILY. 90 tablet 3  . levothyroxine (SYNTHROID, LEVOTHROID) 125 MCG tablet Take 1 tablet (125 mcg total) by mouth daily. 90 tablet 3  . losartan (COZAAR) 50 MG tablet Take 1 tablet (50 mg total) by mouth daily. 90 tablet 3  . magnesium oxide (MAG-OX) 400 MG tablet Take 400 mg  by mouth daily.    . meloxicam (MOBIC) 15 MG tablet Take 1 tablet (15 mg total) by mouth daily. 30 tablet 0  . Multiple Vitamin (MULTIVITAMIN) capsule Take 1 capsule by mouth daily.      [name] PROAIR HFA 108 (90 Base) MCG/ACT inhaler INHALE 2 PUFFS INTO THE LUNGS EVERY 4 (FOUR) HOURS AS NEEDED FOR WHEEZING OR SHORTNESS OF BREATH. 8.5 Inhaler 1  . progesterone (PROMETRIUM) 100 MG capsule Take 100 mg by mouth daily.    . rosuvastatin (CRESTOR) 10 MG tablet Take 1 tablet (10 mg total) by mouth daily. 90 tablet 3  . SYMBICORT 160-4.5 MCG/ACT inhaler INHALE 2 PUFFS INTO THE LUNGS 2 (TWO) TIMES DAILY. 10.2 Inhaler 2  . Turmeric 500 MG TABS Take 500 mg by mouth daily.     No current  facility-administered medications for this visit.     Allergies  Allergen Reactions  . Ivp Dye [Iodinated Diagnostic Agents] Shortness Of Breath and Other (See Comments)    Swelling of extremity of the injection site  . Latex Itching    ITCHING AND COLD SORES AROUND MOUTH WHEN LATEX GLOVES USED BY THE DENTIST/HYGENTIST  . Clarithromycin Nausea And Vomiting    REACTION: nausea  vomiting  . Metronidazole Nausea And Vomiting    Social History   Social History  . Marital status: Divorced    Spouse name: N/A  . Number of children: 3  . Years of education: N/A   Occupational History  . 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 Smoking status: Former Smoker    Packs/day: 0.50    Types: Cigarettes    Quit date: 11/05/1987  . Smokeless tobacco: Never Used  . Alcohol use No     Comment: WINE  . Drug use: No  . Sexual activity: Not on file   Other Topics Concern  . Not on file   Social History Narrative  . No narrative on file    Family History  Problem Relation Age of Onset  . Diabetes Cousin   . Hypertension Cousin   . Heart disease Cousin   . Diabetes Mother   . Heart disease Mother     Rheumatic heart disease  . COPD Father   . Hypertension Father   . Cancer Maternal Grandmother     Breast  . Diabetes Sister   . Kidney disease Sister     Review of Systems:  As stated in the HPI and otherwise negative.   BP 118/60   Pulse 74   Ht 5' 4.5" (1.638 m)   Wt 223 lb 12.8 oz (101.5 kg)   SpO2 98%   BMI 37.82 kg/m   Physical Examination: General: Well developed, well nourished, NAD  HEENT: OP clear, mucus membranes moist  SKIN: warm, dry. No rashes. Neuro: No focal deficits  Musculoskeletal: Muscle strength 5/5 all ext  Psychiatric: Mood and affect normal  Neck: No JVD, no carotid bruits, no thyromegaly, no lymphadenopathy.  Lungs:Clear bilaterally, no wheezes, rhonci, crackles Cardiovascular: Regular rate and  rhythm. No murmurs, gallops or rubs. Abdomen:Soft. Bowel sounds present. Non-tender.  Extremities: Trace to 1+ bilateral lower extremity edema.   Echo 09/08/15: Left ventricle: The cavity size was normal. There was mild focal basal hypertrophy of the septum. Systolic function was mildly to moderately reduced. The estimated ejection fraction was in the range of 40% to 45%. Hypokinesis of the anteroseptal and inferoseptal myocardium. Doppler parameters are consistent  with abnormal left ventricular relaxation (grade 1 diastolic dysfunction). - Right ventricle: The cavity size was normal. Wall thickness was normal. Systolic function was normal. - Tricuspid valve: There was trivial regurgitation. - Pulmonary arteries: PA peak pressure: 26 mm Hg (S). - Dyssynchrony, with contraction of the anterior and lateral walls prior to the septal and inferior walls.  EKG:  EKG is not ordered today. The ekg ordered today demonstrates    Recent Labs: 02/16/2016: ALT 27; BUN 20; Creatinine, Ser 1.14; Hemoglobin 13.9; Platelets 276.0; Potassium 4.0; Sodium 140 05/09/2016: TSH 1.23   Lipid Panel    Component Value Date/Time   CHOL 127 02/16/2016 0951   TRIG 49.0 02/16/2016 0951   HDL 78.20 02/16/2016 0951   CHOLHDL 2 02/16/2016 0951   VLDL 9.8 02/16/2016 0951   LDLCALC 39 02/16/2016 0951     Wt Readings from Last 3 Encounters:  06/02/16 223 lb 12.8 oz (101.5 kg)  02/16/16 225 lb (102.1 kg)  02/15/16 223 lb (101.2 kg)     Other studies Reviewed: Additional studies/ records that were reviewed today include: . Review of the above records demonstrates:    Assessment and Plan:   1. Non-ischemic cardiomyopathy: She has mild reduction LV systolic function. Normal stress test 2014. NO  symptoms to suggest obstructive CAD. Will continue Coreg and Losartan. I will not repeat ischemic testing at this time.   2. Chronic systolic CHF/LE edema: LE edema stable on Lasix. Continue Lasix 40  mg daily    Current medicines are reviewed at length with the patient today.  The patient does not have concerns regarding medicines.  The following changes have been made:  no change  Labs/ tests ordered today include:   No orders of the defined types were placed in this encounter.   Disposition:   FU with me in 12  months  Signed, Lauree Chandler, MD 06/02/2016 5:04 PM    [facility] [place], [place], [place]  [address] Phone: [phone]; Fax: [phone]

## 2016-06-04 ENCOUNTER — Other Ambulatory Visit: Payer: Self-pay | Admitting: Internal Medicine

## 2016-06-09 DIAGNOSIS — M7138 Other bursal cyst, other site: Secondary | ICD-10-CM | POA: Insufficient documentation

## 2016-06-09 DIAGNOSIS — E611 Iron deficiency: Secondary | ICD-10-CM | POA: Insufficient documentation

## 2016-06-09 DIAGNOSIS — D6859 Other primary thrombophilia: Secondary | ICD-10-CM | POA: Insufficient documentation

## 2016-06-09 DIAGNOSIS — M519 Unspecified thoracic, thoracolumbar and lumbosacral intervertebral disc disorder: Secondary | ICD-10-CM | POA: Insufficient documentation

## 2016-06-17 ENCOUNTER — Ambulatory Visit (INDEPENDENT_AMBULATORY_CARE_PROVIDER_SITE_OTHER): Payer: BLUE CROSS/BLUE SHIELD | Admitting: Internal Medicine

## 2016-06-17 ENCOUNTER — Encounter: Payer: Self-pay | Admitting: Internal Medicine

## 2016-06-17 VITALS — BP 136/78 | HR 82 | Temp 98.7°F | Resp 20 | Wt 226.0 lb

## 2016-06-17 DIAGNOSIS — M170 Bilateral primary osteoarthritis of knee: Secondary | ICD-10-CM | POA: Diagnosis not present

## 2016-06-17 DIAGNOSIS — M25562 Pain in left knee: Secondary | ICD-10-CM

## 2016-06-17 DIAGNOSIS — M25561 Pain in right knee: Secondary | ICD-10-CM

## 2016-06-17 DIAGNOSIS — J069 Acute upper respiratory infection, unspecified: Secondary | ICD-10-CM

## 2016-06-17 DIAGNOSIS — E039 Hypothyroidism, unspecified: Secondary | ICD-10-CM

## 2016-06-17 MED ORDER — DICLOFENAC SODIUM 2 % TD SOLN: 1.0000 "application " | Freq: Two times a day (BID) | TRANSDERMAL | 11 refills | Status: DC

## 2016-06-17 MED ORDER — MELOXICAM 15 MG PO TABS: 15.0000 mg | ORAL_TABLET | Freq: Every day | ORAL | 5 refills | Status: DC

## 2016-06-17 MED ORDER — AMOXICILLIN 500 MG PO CAPS: ORAL_CAPSULE | ORAL | 0 refills | Status: DC

## 2016-06-17 NOTE — Assessment & Plan Note (Signed)
Improved now stable overall by history and exam, recent data reviewed with pt, and pt to continue medical treatment as before,  to f/u any worsening symptoms or concerns Lab Results  Component Value Date   TSH 1.23 05/09/2016

## 2016-06-17 NOTE — Assessment & Plan Note (Signed)
Mild to mod, for antibx course,  to f/u any worsening symptoms or concerns 

## 2016-06-17 NOTE — Progress Notes (Signed)
Pre visit review using our clinic review tool, if applicable. No additional management support is needed unless otherwise documented below in the visit note. 

## 2016-06-17 NOTE — Progress Notes (Signed)
Subjective:    Patient ID: Suzanne Hansen, female    DOB: March 11, 1956, 60 y.o.   MRN: [id number]  HPI  .Here to f/u; overall doing ok,  Pt denies chest pain, increasing sob or doe, wheezing, orthopnea, PND, increased LE swelling, palpitations, dizziness or syncope.  Pt denies new neurological symptoms such as new headache, or facial or extremity weakness or numbness.  Pt denies polydipsia, polyuria, or low sugar episode.   Pt denies new neurological symptoms such as new headache, or facial or extremity weakness or numbness.   Pt states overall good compliance with meds Wt Readings from Last 3 Encounters:  06/17/16 226 lb (102.5 kg)  06/02/16 223 lb 12.8 oz (101.5 kg)  02/16/16 225 lb (102.1 kg)  Denies hyper or hypo thyroid symptoms such as voice, skin or hair change.  Has seen cardiology, encouraged to keep trying with the CPAP.  Unfortuanately the day after restarted CPAP 3 days ago, experienced URI symptoms and body aches and chills.  She is convinced the CPAP related to this as happened before as well. 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  Bilat knees with crepitus, better with mobic and pennsaid per Dr Tamala Julian, asks for refills.  Past Medical History:  Diagnosis Date  . ALLERGIC RHINITIS 05/09/2008  . Allergy   . ANXIETY 05/23/2007  . ARM PAIN, LEFT 10/23/2009  . Asthma   . ASTHMA 05/23/2007  . Cardiomyopathy ([place]) 09/08/2015  . CONTACT DERMATITIS 06/09/2009  . Diabetes mellitus   . ENDOMETRIOSIS NOS 05/23/2007  . FACIAL PAIN 06/02/2010  . FREQUENCY, URINARY 05/17/2010  . GLUCOSE INTOLERANCE 08/28/2009  . HERPES SIMPLEX, UNCOMPLICATED [id number]  . Hyperlipidemia 08/28/2015  . HYPERTHYROIDISM 05/23/2007  . Hypoactive thyroid   . HYPOTHYROIDISM 05/09/2008  . Impaired glucose tolerance 01/28/2011  . INSOMNIA, HX OF 05/23/2007  . LATERAL EPICONDYLITIS, RIGHT 03/10/2009  . LIPOMA 10/22/2010  . NECK PAIN 11/10/2010  . OBESITY 05/23/2007    . Plantar fasciitis    Both feet  . SHOULDER PAIN, LEFT 05/17/2010  . SINUSITIS- ACUTE-NOS 11/03/2008  . SKIN LESION 11/10/2010  . Unspecified Chest Pain 07/25/2008  . UNSPECIFIED URTICARIA 06/04/2009  . URI 08/28/2009  . UTI 04/29/2008  . VITAMIN D DEFICIENCY 08/28/2009  . Wheezing 07/12/2010   Past Surgical History:  Procedure Laterality Date  . ENDOMETRIAL ABLATION    . LAMINECTOMY  1996  . LAPAROTOMY     exploratory  . s/p endometrial ablation  12/06  . TUBAL LIGATION    . UTERINE FIBROID SURGERY    . WISDOM TOOTH EXTRACTION      reports that she quit smoking about 28 years ago. Her smoking use included Cigarettes. She smoked 0.50 packs per day. She has never used smokeless tobacco. She reports that she does not drink alcohol or use drugs. family history includes COPD in her father; Cancer in her maternal grandmother; Diabetes in her cousin, mother, and sister; Heart disease in her cousin and mother; Hypertension in her cousin and father; Kidney disease in her sister. Allergies  Allergen Reactions  . Ivp Dye [Iodinated Diagnostic Agents] Shortness Of Breath and Other (See Comments)    Swelling of extremity of the injection site  . Latex Itching    ITCHING AND COLD SORES AROUND MOUTH WHEN LATEX GLOVES USED BY THE DENTIST/HYGENTIST  . Clarithromycin Nausea And Vomiting    REACTION: nausea  vomiting  . Metronidazole Nausea And Vomiting   Current Outpatient Prescriptions  on File Prior to Visit  Medication Sig Dispense Refill  . aspirin 81 MG EC tablet Take 81 mg by mouth daily.      . Calcium Citrate-Vitamin D (CITRACAL PETITES/VITAMIN D PO) Take 1 tablet by mouth daily.    . carvedilol (COREG) 3.125 MG tablet Take 1 tablet (3.125 mg total) by mouth 2 (two) times daily. 180 tablet 3  . Cholecalciferol (VITAMIN D) 2000 UNITS CAPS Take 2 capsules by mouth daily.    . Diclofenac Sodium 2 % SOLN Place 1 application onto the skin 2 (two) times daily.    [name] estradiol (VIVELLE-DOT) 0.05  MG/24HR patch Place 1 patch onto the skin 2 (two) times a week.    . Ferrous Sulfate (IRON) 325 (65 FE) MG TABS Take 1 tablet by mouth daily.    . fexofenadine (ALLEGRA) 180 MG tablet Take 180 mg by mouth daily.      . furosemide (LASIX) 40 MG tablet TAKE 1 TABLET (40 MG TOTAL) BY MOUTH DAILY. 90 tablet 3  . levothyroxine (SYNTHROID, LEVOTHROID) 125 MCG tablet Take 1 tablet (125 mcg total) by mouth daily. 90 tablet 3  . losartan (COZAAR) 50 MG tablet Take 1 tablet (50 mg total) by mouth daily. 90 tablet 3  . magnesium oxide (MAG-OX) 400 MG tablet Take 400 mg by mouth daily.    . meloxicam (MOBIC) 15 MG tablet Take 1 tablet (15 mg total) by mouth daily. 30 tablet 0  . Multiple Vitamin (MULTIVITAMIN) capsule Take 1 capsule by mouth daily.      [name] PROAIR HFA 108 (90 Base) MCG/ACT inhaler INHALE 2 PUFFS INTO THE LUNGS EVERY 4 (FOUR) HOURS AS NEEDED FOR WHEEZING OR SHORTNESS OF BREATH. 8.5 Inhaler 1  . progesterone (PROMETRIUM) 100 MG capsule Take 100 mg by mouth daily.    . rosuvastatin (CRESTOR) 10 MG tablet Take 1 tablet (10 mg total) by mouth daily. 90 tablet 3  . SYMBICORT 160-4.5 MCG/ACT inhaler INHALE 2 PUFFS INTO THE LUNGS 2 (TWO) TIMES DAILY. 10.2 Inhaler 2  . Turmeric 500 MG TABS Take 500 mg by mouth daily.     No current facility-administered medications on file prior to visit.    Review of Systems  Constitutional: Negative for unusual diaphoresis or night sweats HENT: Negative for ear swelling or discharge Eyes: Negative for worsening visual haziness  Respiratory: Negative for choking and stridor.   Gastrointestinal: Negative for distension or worsening eructation Genitourinary: Negative for retention or change in urine volume.  Musculoskeletal: Negative for other MSK pain or swelling Skin: Negative for color change and worsening wound Neurological: Negative for tremors and numbness other than noted  Psychiatric/Behavioral: Negative for decreased concentration or agitation other than  above       Objective:   Physical Exam BP 136/78   Pulse 82   Temp 98.7 F (37.1 C) (Oral)   Resp 20   Wt 226 lb (102.5 kg)   SpO2 97%   BMI 38.19 kg/m  VS noted, mild ill Constitutional: Pt appears in no apparent distress HENT: Head: NCAT.  Right Ear: External ear normal.  Left Ear: External ear normal.  Eyes: . Pupils are equal, round, and reactive to light. Conjunctivae and EOM are normal Bilat tm's with mild erythema.  Max sinus areas non tender.  Pharynx with mild erythema, no exudate Neck: Normal range of motion. Neck supple.  Cardiovascular: Normal rate and regular rhythm.   Pulmonary/Chest: Effort normal and breath sounds without rales or wheezing.  Neurological: Pt  is alert. Not confused , motor grossly intact Skin: Skin is warm. No rash, no LE edema Psychiatric: Pt behavior is normal. No agitation.     Assessment & Plan:

## 2016-06-17 NOTE — Patient Instructions (Signed)
Please take all new medication as prescribed - the antibiotic sent to [facility]  Please continue all other medications as before, and refills have been done if requested - [facility], and the [facility]aid [facility]  Please have the pharmacy call with any other refills you may need.  Please continue your efforts at being more active, low cholesterol diet, and weight control.  Please keep your appointments with your specialists as you may have planned

## 2016-06-17 NOTE — Assessment & Plan Note (Signed)
Improved, mild pain persists, for med refills per pt request

## 2016-07-07 ENCOUNTER — Other Ambulatory Visit: Payer: Self-pay | Admitting: Internal Medicine

## 2016-08-04 ENCOUNTER — Other Ambulatory Visit: Payer: Self-pay | Admitting: Internal Medicine

## 2016-08-09 ENCOUNTER — Ambulatory Visit (INDEPENDENT_AMBULATORY_CARE_PROVIDER_SITE_OTHER): Payer: BLUE CROSS/BLUE SHIELD | Admitting: Internal Medicine

## 2016-08-09 ENCOUNTER — Encounter: Payer: Self-pay | Admitting: Internal Medicine

## 2016-08-09 VITALS — BP 130/74 | HR 98 | Temp 98.2°F | Resp 20 | Wt 225.0 lb

## 2016-08-09 DIAGNOSIS — Z23 Encounter for immunization: Secondary | ICD-10-CM

## 2016-08-09 DIAGNOSIS — J45901 Unspecified asthma with (acute) exacerbation: Secondary | ICD-10-CM

## 2016-08-09 DIAGNOSIS — J019 Acute sinusitis, unspecified: Secondary | ICD-10-CM

## 2016-08-09 DIAGNOSIS — M25532 Pain in left wrist: Secondary | ICD-10-CM | POA: Diagnosis not present

## 2016-08-09 DIAGNOSIS — M25562 Pain in left knee: Secondary | ICD-10-CM | POA: Diagnosis not present

## 2016-08-09 DIAGNOSIS — E785 Hyperlipidemia, unspecified: Secondary | ICD-10-CM | POA: Diagnosis not present

## 2016-08-09 DIAGNOSIS — E119 Type 2 diabetes mellitus without complications: Secondary | ICD-10-CM | POA: Diagnosis not present

## 2016-08-09 DIAGNOSIS — Z299 Encounter for prophylactic measures, unspecified: Secondary | ICD-10-CM | POA: Diagnosis not present

## 2016-08-09 DIAGNOSIS — M25561 Pain in right knee: Secondary | ICD-10-CM | POA: Diagnosis not present

## 2016-08-09 DIAGNOSIS — N761 Subacute and chronic vaginitis: Secondary | ICD-10-CM | POA: Diagnosis not present

## 2016-08-09 DIAGNOSIS — E039 Hypothyroidism, unspecified: Secondary | ICD-10-CM | POA: Diagnosis not present

## 2016-08-09 MED ORDER — HYDROCOD POLST-CPM POLST ER 10-8 MG/5ML PO SUER: 5.0000 mL | Freq: Two times a day (BID) | ORAL | 0 refills | Status: DC | PRN

## 2016-08-09 MED ORDER — LEVOFLOXACIN 250 MG PO TABS: 250.0000 mg | ORAL_TABLET | Freq: Every day | ORAL | 0 refills | Status: AC

## 2016-08-09 NOTE — Patient Instructions (Addendum)
You had the steroid shot today  Please take all new medication as prescribed - the antibiotic, and cough medicine if needed  You can also take Delsym OTC for cough, and/or Mucinex (or it's generic off brand) for congestion, and tylenol as needed for pain.  Please continue all other medications as before, and refills have been done if requested.  Please have the pharmacy call with any other refills you may need.  Please keep your appointments with your specialists as you may have planned

## 2016-08-09 NOTE — Assessment & Plan Note (Signed)
Mild, for depomedrol IM,  to f/u any worsening symptoms or concerns

## 2016-08-09 NOTE — Progress Notes (Signed)
Subjective:    Patient ID: Suzanne Hansen, female    DOB: [birth year], 60 y.o.   MRN: [id number]  HPI   Here with 2-3 days acute onset fever, facial pain, pressure, headache, general weakness and malaise, and greenish d/c, with mild ST and cough, but pt denies chest pain,, increased sob or doe, orthopnea, PND, increased LE swelling, palpitations, dizziness or syncope, excpet for onset mild wheeze and sob last evening.  Pt denies polydipsia, polyuria, Overall good compliance with treatment, and good medicine tolerability, but using the inhaler very frequent today Past Medical History:  Diagnosis Date  . ALLERGIC RHINITIS 05/09/2008  . Allergy   . ANXIETY 05/23/2007  . ARM PAIN, LEFT 10/23/2009  . Asthma   . ASTHMA 05/23/2007  . Cardiomyopathy ([place]) 09/08/2015  . CONTACT DERMATITIS 06/09/2009  . Diabetes mellitus   . ENDOMETRIOSIS NOS 05/23/2007  . FACIAL PAIN 06/02/2010  . FREQUENCY, URINARY 05/17/2010  . GLUCOSE INTOLERANCE 08/28/2009  . HERPES SIMPLEX, UNCOMPLICATED [id number]  . Hyperlipidemia 08/28/2015  . HYPERTHYROIDISM 05/23/2007  . Hypoactive thyroid   . HYPOTHYROIDISM 05/09/2008  . Impaired glucose tolerance 01/28/2011  . INSOMNIA, HX OF 05/23/2007  . LATERAL EPICONDYLITIS, RIGHT 03/10/2009  . LIPOMA 10/22/2010  . NECK PAIN 11/10/2010  . OBESITY 05/23/2007  . Plantar fasciitis    Both feet  . SHOULDER PAIN, LEFT 05/17/2010  . SINUSITIS- ACUTE-NOS 11/03/2008  . SKIN LESION 11/10/2010  . Unspecified Chest Pain 07/25/2008  . UNSPECIFIED URTICARIA 06/04/2009  . URI 08/28/2009  . UTI 04/29/2008  . VITAMIN D DEFICIENCY 08/28/2009  . Wheezing 07/12/2010   Past Surgical History:  Procedure Laterality Date  . ENDOMETRIAL ABLATION    . LAMINECTOMY  1996  . LAPAROTOMY     exploratory  . s/p endometrial ablation  12/06  . TUBAL LIGATION    . UTERINE FIBROID SURGERY    . WISDOM TOOTH EXTRACTION      reports that she quit smoking about 28 years ago. Her smoking use included Cigarettes. She  smoked 0.50 packs per day. She has never used smokeless tobacco. She reports that she does not drink alcohol or use drugs. family history includes COPD in her father; Cancer in her maternal grandmother; Diabetes in her cousin, mother, and sister; Heart disease in her cousin and mother; Hypertension in her cousin and father; Kidney disease in her sister. Allergies  Allergen Reactions  . Ivp Dye [Iodinated Diagnostic Agents] Shortness Of Breath and Other (See Comments)    Swelling of extremity of the injection site  . Latex Itching    ITCHING AND COLD SORES AROUND MOUTH WHEN LATEX GLOVES USED BY THE DENTIST/HYGENTIST  . Clarithromycin Nausea And Vomiting    REACTION: nausea  vomiting  . Metronidazole Nausea And Vomiting   Current Outpatient Prescriptions on File Prior to Visit  Medication Sig Dispense Refill  . amoxicillin (AMOXIL) 500 MG capsule 2 tab by mouth twice per day 40 capsule 0  . aspirin 81 MG EC tablet Take 81 mg by mouth daily.      . Calcium Citrate-Vitamin D (CITRACAL PETITES/VITAMIN D PO) Take 1 tablet by mouth daily.    . carvedilol (COREG) 3.125 MG tablet Take 1 tablet (3.125 mg total) by mouth 2 (two) times daily. 180 tablet 3  . Cholecalciferol (VITAMIN D) 2000 UNITS CAPS Take 2 capsules by mouth daily.    . Diclofenac Sodium 2 % SOLN Place 1 application onto the skin 2 (two) times daily. 112 g 11  . estradiol (VIVELLE-DOT)  0.05 MG/24HR patch Place 1 patch onto the skin 2 (two) times a week.    . Ferrous Sulfate (IRON) 325 (65 FE) MG TABS Take 1 tablet by mouth daily.    . fexofenadine (ALLEGRA) 180 MG tablet Take 180 mg by mouth daily.      . furosemide (LASIX) 40 MG tablet TAKE 1 TABLET (40 MG TOTAL) BY MOUTH DAILY. 90 tablet 3  . levothyroxine (SYNTHROID, LEVOTHROID) 125 MCG tablet Take 1 tablet (125 mcg total) by mouth daily. 90 tablet 3  . losartan (COZAAR) 50 MG tablet Take 1 tablet (50 mg total) by mouth daily. 90 tablet 3  . magnesium oxide (MAG-OX) 400 MG tablet  Take 400 mg by mouth daily.    . meloxicam (MOBIC) 15 MG tablet Take 1 tablet (15 mg total) by mouth daily. 30 tablet 5  . Multiple Vitamin (MULTIVITAMIN) capsule Take 1 capsule by mouth daily.      [name] PROAIR HFA 108 (90 Base) MCG/ACT inhaler INHALE 2 PUFFS INTO THE LUNGS EVERY 4 (FOUR) HOURS AS NEEDED FOR WHEEZING OR SHORTNESS OF BREATH. 8.5 Inhaler 1  . progesterone (PROMETRIUM) 100 MG capsule Take 100 mg by mouth daily.    . rosuvastatin (CRESTOR) 10 MG tablet TAKE 1 TABLET BY MOUTH EVERY DAY 90 tablet 3  . SYMBICORT 160-4.5 MCG/ACT inhaler INHALE 2 PUFFS INTO THE LUNGS 2 (TWO) TIMES DAILY. 10.2 Inhaler 2  . Turmeric 500 MG TABS Take 500 mg by mouth daily.     No current facility-administered medications on file prior to visit.    Review of Systems  Constitutional: Negative for unusual diaphoresis or night sweats HENT: Negative for ear swelling or discharge Eyes: Negative for worsening visual haziness  Respiratory: Negative for choking and stridor.   Gastrointestinal: Negative for distension or worsening eructation Genitourinary: Negative for retention or change in urine volume.  Musculoskeletal: Negative for other MSK pain or swelling Skin: Negative for color change and worsening wound Neurological: Negative for tremors and numbness other than noted  Psychiatric/Behavioral: Negative for decreased concentration or agitation other than above   All other system neg per pt    Objective:   Physical Exam BP 130/74   Pulse 98   Temp 98.2 F (36.8 C) (Oral)   Resp 20   Wt 225 lb (102.1 kg)   SpO2 92%   BMI 38.02 kg/m  VS noted, mild ill Constitutional: Pt appears in no apparent distress HENT: Head: NCAT.  Right Ear: External ear normal.  Left Ear: External ear normal.  Bilat tm's with mild erythema.  Max sinus areas mod tender.  Pharynx with mild erythema, no exudate Eyes: . Pupils are equal, round, and reactive to light. Conjunctivae and EOM are normal Neck: Normal range of  motion. Neck supple.  Cardiovascular: Normal rate and regular rhythm.   Pulmonary/Chest: Effort normal and breath sounds decreased without rales but with mild bilat wheezing. , no rhonchi Neurological: Pt is alert. Not confused , motor grossly intact Skin: Skin is warm. No rash, no LE edema Psychiatric: Pt behavior is normal. No agitation.     Assessment & Plan:

## 2016-08-09 NOTE — Assessment & Plan Note (Signed)
stable overall by history and exam, recent data reviewed with pt, and pt to continue medical treatment as before,  to f/u any worsening symptoms or concerns Lab Results  Component Value Date   HGBA1C 5.6 02/16/2016   Pt to call for onset polys or cbg > 200 with tx today

## 2016-08-09 NOTE — Assessment & Plan Note (Signed)
Mild to mod, for antibx course,  to f/u any worsening symptoms or concerns 

## 2016-08-09 NOTE — Progress Notes (Signed)
Pre visit review using our clinic review tool, if applicable. No additional management support is needed unless otherwise documented below in the visit note. 

## 2016-09-14 ENCOUNTER — Telehealth: Payer: Self-pay | Admitting: Internal Medicine

## 2016-09-14 NOTE — Telephone Encounter (Signed)
Patient has appt scheduled for 12/8.  Patient is requesting symbicort to be sent to [facility] on [address].

## 2016-09-15 MED ORDER — BUDESONIDE-FORMOTEROL FUMARATE 160-4.5 MCG/ACT IN AERO: INHALATION_SPRAY | RESPIRATORY_TRACT | 2 refills | Status: DC

## 2016-09-15 NOTE — Telephone Encounter (Signed)
Refill sent.

## 2016-09-16 ENCOUNTER — Encounter: Payer: Self-pay | Admitting: Internal Medicine

## 2016-09-16 ENCOUNTER — Telehealth: Payer: Self-pay

## 2016-09-16 ENCOUNTER — Ambulatory Visit (INDEPENDENT_AMBULATORY_CARE_PROVIDER_SITE_OTHER): Payer: BLUE CROSS/BLUE SHIELD | Admitting: Internal Medicine

## 2016-09-16 VITALS — BP 128/72 | HR 84 | Temp 98.1°F | Resp 20 | Wt 225.0 lb

## 2016-09-16 DIAGNOSIS — M25562 Pain in left knee: Secondary | ICD-10-CM

## 2016-09-16 DIAGNOSIS — E119 Type 2 diabetes mellitus without complications: Secondary | ICD-10-CM | POA: Diagnosis not present

## 2016-09-16 DIAGNOSIS — Z0001 Encounter for general adult medical examination with abnormal findings: Secondary | ICD-10-CM

## 2016-09-16 DIAGNOSIS — E039 Hypothyroidism, unspecified: Secondary | ICD-10-CM

## 2016-09-16 DIAGNOSIS — M25532 Pain in left wrist: Secondary | ICD-10-CM

## 2016-09-16 DIAGNOSIS — E785 Hyperlipidemia, unspecified: Secondary | ICD-10-CM | POA: Diagnosis not present

## 2016-09-16 DIAGNOSIS — N761 Subacute and chronic vaginitis: Secondary | ICD-10-CM

## 2016-09-16 DIAGNOSIS — M25561 Pain in right knee: Secondary | ICD-10-CM | POA: Diagnosis not present

## 2016-09-16 DIAGNOSIS — Z299 Encounter for prophylactic measures, unspecified: Secondary | ICD-10-CM

## 2016-09-16 MED ORDER — FLUCONAZOLE 150 MG PO TABS: ORAL_TABLET | ORAL | 1 refills | Status: DC

## 2016-09-16 MED ORDER — MELOXICAM 15 MG PO TABS: 15.0000 mg | ORAL_TABLET | Freq: Every day | ORAL | 5 refills | Status: DC

## 2016-09-16 NOTE — Progress Notes (Signed)
Subjective:    Patient ID: Suzanne Hansen, female    DOB: Oct 12, 1955, 60 y.o.   MRN: [id number]  HPI  Here to f/u; overall doing ok,  Pt denies chest pain, increasing sob or doe, wheezing, orthopnea, PND, increased LE swelling, palpitations, dizziness or syncope.  Pt denies new neurological symptoms such as new headache, or facial or extremity weakness or numbness.  Pt denies polydipsia, polyuria, or low sugar episode.   Pt denies new neurological symptoms such as new headache, or facial or extremity weakness or numbness.   Pt states overall good compliance with meds, mostly trying to follow appropriate diet, with wt overall stable,  but little exercise however.  Declines immunizations today.  Does c/o left wrist pain mild x 1 mo intermitent, sharp without swelling but located lateral aspect with some tenderness, worse to thumb use but no necessarily flexion/extension.  Also incidentally yeast like vaginal d/c for several days.  Also Denies hyper or hypo thyroid symptoms such as voice, skin or hair change. Past Medical History:  Diagnosis Date  . ALLERGIC RHINITIS 05/09/2008  . Allergy   . ANXIETY 05/23/2007  . ARM PAIN, LEFT 10/23/2009  . Asthma   . ASTHMA 05/23/2007  . Cardiomyopathy ([place]) 09/08/2015  . CONTACT DERMATITIS 06/09/2009  . Diabetes mellitus   . ENDOMETRIOSIS NOS 05/23/2007  . FACIAL PAIN 06/02/2010  . FREQUENCY, URINARY 05/17/2010  . GLUCOSE INTOLERANCE 08/28/2009  . HERPES SIMPLEX, UNCOMPLICATED [id number]  . Hyperlipidemia 08/28/2015  . HYPERTHYROIDISM 05/23/2007  . Hypoactive thyroid   . HYPOTHYROIDISM 05/09/2008  . Impaired glucose tolerance 01/28/2011  . INSOMNIA, HX OF 05/23/2007  . LATERAL EPICONDYLITIS, RIGHT 03/10/2009  . LIPOMA 10/22/2010  . NECK PAIN 11/10/2010  . OBESITY 05/23/2007  . Plantar fasciitis    Both feet  . SHOULDER PAIN, LEFT 05/17/2010  . SINUSITIS- ACUTE-NOS 11/03/2008  . SKIN LESION 11/10/2010  . Unspecified Chest Pain 07/25/2008  . UNSPECIFIED URTICARIA  06/04/2009  . URI 08/28/2009  . UTI 04/29/2008  . VITAMIN D DEFICIENCY 08/28/2009  . Wheezing 07/12/2010   Past Surgical History:  Procedure Laterality Date  . ENDOMETRIAL ABLATION    . LAMINECTOMY  1996  . LAPAROTOMY     exploratory  . s/p endometrial ablation  12/06  . TUBAL LIGATION    . UTERINE FIBROID SURGERY    . WISDOM TOOTH EXTRACTION      reports that she quit smoking about 28 years ago. Her smoking use included Cigarettes. She smoked 0.50 packs per day. She has never used smokeless tobacco. She reports that she does not drink alcohol or use drugs. family history includes COPD in her father; Cancer in her maternal grandmother; Diabetes in her cousin, mother, and sister; Heart disease in her cousin and mother; Hypertension in her cousin and father; Kidney disease in her sister. Allergies  Allergen Reactions  . Ivp Dye [Iodinated Diagnostic Agents] Shortness Of Breath and Other (See Comments)    Swelling of extremity of the injection site  . Latex Itching    ITCHING AND COLD SORES AROUND MOUTH WHEN LATEX GLOVES USED BY THE DENTIST/HYGENTIST  . Clarithromycin Nausea And Vomiting    REACTION: nausea  vomiting  . Metronidazole Nausea And Vomiting   Current Outpatient Prescriptions on File Prior to Visit  Medication Sig Dispense Refill  . aspirin 81 MG EC tablet Take 81 mg by mouth daily.      . budesonide-formoterol (SYMBICORT) 160-4.5 MCG/ACT inhaler INHALE 2 PUFFS INTO THE LUNGS 2 (TWO) TIMES DAILY.  10.2 Inhaler 2  . Calcium Citrate-Vitamin D (CITRACAL PETITES/VITAMIN D PO) Take 1 tablet by mouth daily.    . carvedilol (COREG) 3.125 MG tablet Take 1 tablet (3.125 mg total) by mouth 2 (two) times daily. 180 tablet 3  . Cholecalciferol (VITAMIN D) 2000 UNITS CAPS Take 2 capsules by mouth daily.    . Diclofenac Sodium 2 % SOLN Place 1 application onto the skin 2 (two) times daily. 112 g 11  . estradiol (VIVELLE-DOT) 0.05 MG/24HR patch Place 1 patch onto the skin 2 (two) times a  week.    . Ferrous Sulfate (IRON) 325 (65 FE) MG TABS Take 1 tablet by mouth daily.    . fexofenadine (ALLEGRA) 180 MG tablet Take 180 mg by mouth daily.      . furosemide (LASIX) 40 MG tablet TAKE 1 TABLET (40 MG TOTAL) BY MOUTH DAILY. 90 tablet 3  . levothyroxine (SYNTHROID, LEVOTHROID) 125 MCG tablet Take 1 tablet (125 mcg total) by mouth daily. 90 tablet 3  . losartan (COZAAR) 50 MG tablet Take 1 tablet (50 mg total) by mouth daily. 90 tablet 3  . magnesium oxide (MAG-OX) 400 MG tablet Take 400 mg by mouth daily.    . Multiple Vitamin (MULTIVITAMIN) capsule Take 1 capsule by mouth daily.      [name] PROAIR HFA 108 (90 Base) MCG/ACT inhaler INHALE 2 PUFFS INTO THE LUNGS EVERY 4 (FOUR) HOURS AS NEEDED FOR WHEEZING OR SHORTNESS OF BREATH. 8.5 Inhaler 1  . progesterone (PROMETRIUM) 100 MG capsule Take 100 mg by mouth daily.    . rosuvastatin (CRESTOR) 10 MG tablet TAKE 1 TABLET BY MOUTH EVERY DAY 90 tablet 3  . Turmeric 500 MG TABS Take 500 mg by mouth daily.     No current facility-administered medications on file prior to visit.    Review of Systems  Constitutional: Negative for unusual diaphoresis or night sweats HENT: Negative for ear swelling or discharge Eyes: Negative for worsening visual haziness  Respiratory: Negative for choking and stridor.   Gastrointestinal: Negative for distension or worsening eructation Genitourinary: Negative for retention or change in urine volume.  Musculoskeletal: Negative for other MSK pain or swelling Skin: Negative for color change and worsening wound Neurological: Negative for tremors and numbness other than noted  Psychiatric/Behavioral: Negative for decreased concentration or agitation other than above   All other system neg per pt    Objective:   Physical Exam BP 128/72   Pulse 84   Temp 98.1 F (36.7 C) (Oral)   Resp 20   Wt 225 lb (102.1 kg)   SpO2 96%   BMI 38.02 kg/m  VS noted,  Constitutional: Pt appears in no apparent distress HENT:  Head: NCAT.  Right Ear: External ear normal.  Left Ear: External ear normal.  Eyes: . Pupils are equal, round, and reactive to light. Conjunctivae and EOM are normal Neck: Normal range of motion. Neck supple.  Cardiovascular: Normal rate and regular rhythm.   Pulmonary/Chest: Effort normal and breath sounds without rales or wheezing.  Neurological: Pt is alert. Not confused , motor grossly intact Skin: Skin is warm. No rash, no LE edema Psychiatric: Pt behavior is normal. No agitation.  Left wrist with point tendnerness at the lateral joint line without swelling, skin change, reduced ROM or specific tendon tenderness, o/w neurovasc intact  A1C - poct-5.3       Assessment & Plan:

## 2016-09-16 NOTE — Telephone Encounter (Signed)
Medication refill sent to pharmacy  

## 2016-09-16 NOTE — Progress Notes (Signed)
Pre visit review using our clinic review tool, if applicable. No additional management support is needed unless otherwise documented below in the visit note. 

## 2016-09-16 NOTE — Patient Instructions (Addendum)
Your A1c today is: 5.3  Please take all new medication as prescribed - the diflucan  Please continue all other medications as before, and refills have been done if requested - the mobic  Please have the pharmacy call with any other refills you may need.  Please continue your efforts at being more active, low cholesterol diet, and weight control.  Please keep your appointments with your specialists as you may have planned  We can do further lab work at your next visit  Please return in 6 months, or sooner if needed, with Lab testing done 3-5 days before

## 2016-09-17 DIAGNOSIS — N760 Acute vaginitis: Secondary | ICD-10-CM | POA: Insufficient documentation

## 2016-09-17 NOTE — Assessment & Plan Note (Signed)
For diflucan asd,  to f/u any worsening symptoms or concerns 

## 2016-09-17 NOTE — Assessment & Plan Note (Signed)
Etiology unclear, ? Neuritic vs tendontis vs DJD, for mobic prn,  to f/u any worsening symptoms or concerns

## 2016-09-17 NOTE — Assessment & Plan Note (Signed)
stable overall by history and exam, recent data reviewed with pt, and pt to continue medical treatment as before,  to f/u any worsening symptoms or concerns Lab Results  Component Value Date   LDLCALC 39 02/16/2016

## 2016-09-17 NOTE — Assessment & Plan Note (Signed)
stable overall by history and exam, recent data reviewed with pt, and pt to continue medical treatment as before,  to f/u any worsening symptoms or concerns Lab Results  Component Value Date   HGBA1C 5.6 02/16/2016

## 2016-09-30 ENCOUNTER — Other Ambulatory Visit: Payer: Self-pay | Admitting: Internal Medicine

## 2016-11-02 ENCOUNTER — Telehealth: Payer: Self-pay | Admitting: Neurology

## 2016-11-02 ENCOUNTER — Encounter: Payer: Self-pay | Admitting: Internal Medicine

## 2016-11-02 ENCOUNTER — Ambulatory Visit (INDEPENDENT_AMBULATORY_CARE_PROVIDER_SITE_OTHER): Payer: BLUE CROSS/BLUE SHIELD | Admitting: Internal Medicine

## 2016-11-02 VITALS — BP 130/70 | HR 77 | Temp 98.5°F | Resp 20 | Wt 232.0 lb

## 2016-11-02 DIAGNOSIS — E119 Type 2 diabetes mellitus without complications: Secondary | ICD-10-CM | POA: Diagnosis not present

## 2016-11-02 DIAGNOSIS — J45901 Unspecified asthma with (acute) exacerbation: Secondary | ICD-10-CM

## 2016-11-02 DIAGNOSIS — R05 Cough: Secondary | ICD-10-CM

## 2016-11-02 DIAGNOSIS — R059 Cough, unspecified: Secondary | ICD-10-CM

## 2016-11-02 MED ORDER — LEVOFLOXACIN 500 MG PO TABS
500.0000 mg | ORAL_TABLET | Freq: Every day | ORAL | 0 refills | Status: DC
Start: 2016-11-02 — End: 2016-11-06

## 2016-11-02 MED ORDER — LEVOFLOXACIN 500 MG PO TABS: 500.0000 mg | ORAL_TABLET | Freq: Every day | ORAL | 0 refills | Status: AC

## 2016-11-02 MED ORDER — PREDNISONE 10 MG PO TABS: 10.0000 mg | ORAL_TABLET | Freq: Every day | ORAL | 0 refills | Status: AC

## 2016-11-02 MED ORDER — HYDROCOD POLST-CPM POLST ER 10-8 MG/5ML PO SUER: 5.0000 mL | Freq: Two times a day (BID) | ORAL | 0 refills | Status: DC | PRN

## 2016-11-02 NOTE — Patient Instructions (Signed)
You had the steroid shot today  Please take all new medication as prescribed - the antibiotic, cough medicine, and prednisone  Please continue all other medications as before, and refills have been done if requested.  Please have the pharmacy call with any other refills you may need.  Please keep your appointments with your specialists as you may have planned   

## 2016-11-02 NOTE — Progress Notes (Signed)
Subjective:    Patient ID: Suzanne Hansen, female    DOB: Mar 23, 1956, 61 y.o.   MRN: [id number]  HPIb  Here with acute onset mild to mod 2-3 days ST, HA, general weakness and malaise, with prod cough greenish sputum, but Pt denies chest pain, increased sob or doe, wheezing, orthopnea, PND, increased LE swelling, palpitations, dizziness or syncope, except for onset mild wheezing and sob since last PM. Pt denies new neurological symptoms such as new headache, or facial or extremity weakness or numbness   Pt denies polydipsia, polyuria.  No other new history Past Medical History:  Diagnosis Date  . ALLERGIC RHINITIS 05/09/2008  . Allergy   . ANXIETY 05/23/2007  . ARM PAIN, LEFT 10/23/2009  . Asthma   . ASTHMA 05/23/2007  . Cardiomyopathy ([place]) 09/08/2015  . CONTACT DERMATITIS 06/09/2009  . Diabetes mellitus   . ENDOMETRIOSIS NOS 05/23/2007  . FACIAL PAIN 06/02/2010  . FREQUENCY, URINARY 05/17/2010  . GLUCOSE INTOLERANCE 08/28/2009  . HERPES SIMPLEX, UNCOMPLICATED [id number]  . Hyperlipidemia 08/28/2015  . HYPERTHYROIDISM 05/23/2007  . Hypoactive thyroid   . HYPOTHYROIDISM 05/09/2008  . Impaired glucose tolerance 01/28/2011  . INSOMNIA, HX OF 05/23/2007  . LATERAL EPICONDYLITIS, RIGHT 03/10/2009  . LIPOMA 10/22/2010  . NECK PAIN 11/10/2010  . OBESITY 05/23/2007  . Plantar fasciitis    Both feet  . SHOULDER PAIN, LEFT 05/17/2010  . SINUSITIS- ACUTE-NOS 11/03/2008  . SKIN LESION 11/10/2010  . Unspecified Chest Pain 07/25/2008  . UNSPECIFIED URTICARIA 06/04/2009  . URI 08/28/2009  . UTI 04/29/2008  . VITAMIN D DEFICIENCY 08/28/2009  . Wheezing 07/12/2010   Past Surgical History:  Procedure Laterality Date  . ENDOMETRIAL ABLATION    . LAMINECTOMY  1996  . LAPAROTOMY     exploratory  . s/p endometrial ablation  12/06  . TUBAL LIGATION    . UTERINE FIBROID SURGERY    . WISDOM TOOTH EXTRACTION      reports that she quit smoking about 29 years ago. Her smoking use included Cigarettes. She smoked 0.50  packs per day. She has never used smokeless tobacco. She reports that she does not drink alcohol or use drugs. family history includes COPD in her father; Cancer in her maternal grandmother; Diabetes in her cousin, mother, and sister; Heart disease in her cousin and mother; Hypertension in her cousin and father; Kidney disease in her sister. Allergies  Allergen Reactions  . Ivp Dye [Iodinated Diagnostic Agents] Shortness Of Breath and Other (See Comments)    Swelling of extremity of the injection site  . Latex Itching    ITCHING AND COLD SORES AROUND MOUTH WHEN LATEX GLOVES USED BY THE DENTIST/HYGENTIST  . Clarithromycin Nausea And Vomiting    REACTION: nausea  vomiting  . Metronidazole Nausea And Vomiting   Current Outpatient Prescriptions on File Prior to Visit  Medication Sig Dispense Refill  . aspirin 81 MG EC tablet Take 81 mg by mouth daily.      . budesonide-formoterol (SYMBICORT) 160-4.5 MCG/ACT inhaler INHALE 2 PUFFS INTO THE LUNGS 2 (TWO) TIMES DAILY. 10.2 Inhaler 2  . Calcium Citrate-Vitamin D (CITRACAL PETITES/VITAMIN D PO) Take 1 tablet by mouth daily.    . carvedilol (COREG) 3.125 MG tablet Take 1 tablet (3.125 mg total) by mouth 2 (two) times daily. 180 tablet 3  . Cholecalciferol (VITAMIN D) 2000 UNITS CAPS Take 2 capsules by mouth daily.    . Diclofenac Sodium 2 % SOLN Place 1 application onto the skin 2 (two) times daily.  112 g 11  . estradiol (VIVELLE-DOT) 0.05 MG/24HR patch Place 1 patch onto the skin 2 (two) times a week.    . Ferrous Sulfate (IRON) 325 (65 FE) MG TABS Take 1 tablet by mouth daily.    . fexofenadine (ALLEGRA) 180 MG tablet Take 180 mg by mouth daily.      . fluconazole (DIFLUCAN) 150 MG tablet 1 tab by mouth every 3 days as needed 2 tablet 1  . furosemide (LASIX) 40 MG tablet TAKE 1 TABLET (40 MG TOTAL) BY MOUTH DAILY. 90 tablet 3  . levothyroxine (SYNTHROID, LEVOTHROID) 125 MCG tablet Take 1 tablet (125 mcg total) by mouth daily. 90 tablet 3  .  losartan (COZAAR) 50 MG tablet Take 1 tablet (50 mg total) by mouth daily. 90 tablet 3  . magnesium oxide (MAG-OX) 400 MG tablet Take 400 mg by mouth daily.    . meloxicam (MOBIC) 15 MG tablet Take 1 tablet (15 mg total) by mouth daily. 30 tablet 5  . Multiple Vitamin (MULTIVITAMIN) capsule Take 1 capsule by mouth daily.      [name] PROAIR HFA 108 (90 Base) MCG/ACT inhaler INHALE 2 PUFFS INTO THE LUNGS EVERY 4 (FOUR) HOURS AS NEEDED FOR WHEEZING OR SHORTNESS OF BREATH. 8.5 Inhaler 1  . progesterone (PROMETRIUM) 100 MG capsule Take 100 mg by mouth daily.    . rosuvastatin (CRESTOR) 10 MG tablet TAKE 1 TABLET BY MOUTH EVERY DAY 90 tablet 3  . Turmeric 500 MG TABS Take 500 mg by mouth daily.     No current facility-administered medications on file prior to visit.    Review of Systems  Constitutional: Negative for unusual diaphoresis or night sweats HENT: Negative for ear swelling or discharge Eyes: Negative for worsening visual haziness  Respiratory: Negative for choking and stridor.   Gastrointestinal: Negative for distension or worsening eructation Genitourinary: Negative for retention or change in urine volume.  Musculoskeletal: Negative for other MSK pain or swelling Skin: Negative for color change and worsening wound Neurological: Negative for tremors and numbness other than noted  Psychiatric/Behavioral: Negative for decreased concentration or agitation other than above   All other system neg per pt    Objective:   Physical Exam BP 130/70   Pulse 77   Temp 98.5 F (36.9 C) (Oral)   Resp 20   Wt 232 lb (105.2 kg)   SpO2 94%   BMI 39.21 kg/m  VS noted, mild ill Constitutional: Pt appears in no apparent distress HENT: Head: NCAT.  Right Ear: External ear normal.  Left Ear: External ear normal.  Eyes: . Pupils are equal, round, and reactive to light. Conjunctivae and EOM are normal Bilat tm's with mild erythema.  Max sinus areas mild tender.  Pharynx with mild erythema, no  exudate Neck: Normal range of motion. Neck supple. With bilat submandib LA  Cardiovascular: Normal rate and regular rhythm.   Pulmonary/Chest: Effort normal and breath sounds decreased without rales but with mild scattered wheezing.  Neurological: Pt is alert. Not confused , motor grossly intact Skin: Skin is warm. No rash, no LE edema Psychiatric: Pt behavior is normal. No agitation.  No other new exam finding    Assessment & Plan:

## 2016-11-02 NOTE — Progress Notes (Signed)
Pre visit review using our clinic review tool, if applicable. No additional management support is needed unless otherwise documented below in the visit note. 

## 2016-11-02 NOTE — Telephone Encounter (Signed)
FYI-Patient called to advise there will be a company faxing over an order from a company for something different other than CPAP machine.

## 2016-11-06 DIAGNOSIS — R059 Cough, unspecified: Secondary | ICD-10-CM | POA: Insufficient documentation

## 2016-11-06 DIAGNOSIS — R05 Cough: Secondary | ICD-10-CM | POA: Insufficient documentation

## 2016-11-06 NOTE — Assessment & Plan Note (Signed)
Mild to mod, c/w bronchitis vs pna, declines cxr, for antibx course, to f/u any worsening symptoms or concerns  

## 2016-11-06 NOTE — Assessment & Plan Note (Signed)
Mild to mod, for depomedrol IM 80, predpac asd, to f/u any worsening symptoms or concerns 

## 2016-11-06 NOTE — Assessment & Plan Note (Signed)
stable overall by history and exam, recent data reviewed with pt, and pt to continue medical treatment as before,  to f/u any worsening symptoms or concerns Lab Results  Component Value Date   HGBA1C 5.6 02/16/2016   Pt to call for onset polys or cbg > 200 with steroid tx

## 2016-11-08 ENCOUNTER — Encounter: Payer: Self-pay | Admitting: Nurse Practitioner

## 2016-11-08 ENCOUNTER — Telehealth: Payer: Self-pay | Admitting: Internal Medicine

## 2016-11-09 NOTE — Telephone Encounter (Signed)
I called pt. I have not received anything for her via fax. She reports that she could not tolerate cpap and therefore went to see [facility] and [employer]. They did a sleep study on her and need it reviewed by Dr. Brett Fairy. I gave her my direct fax number and asked her to contact [facility] and have them re-fax whatever they need [place] to look at. Pt verbalized understanding.

## 2016-11-10 ENCOUNTER — Ambulatory Visit (INDEPENDENT_AMBULATORY_CARE_PROVIDER_SITE_OTHER): Payer: BLUE CROSS/BLUE SHIELD | Admitting: Internal Medicine

## 2016-11-10 ENCOUNTER — Telehealth: Payer: Self-pay | Admitting: Cardiovascular Disease

## 2016-11-10 ENCOUNTER — Other Ambulatory Visit: Payer: BLUE CROSS/BLUE SHIELD

## 2016-11-10 VITALS — BP 130/70 | HR 90 | Resp 20 | Wt 230.0 lb

## 2016-11-10 DIAGNOSIS — J019 Acute sinusitis, unspecified: Secondary | ICD-10-CM | POA: Diagnosis not present

## 2016-11-10 DIAGNOSIS — R6884 Jaw pain: Secondary | ICD-10-CM | POA: Diagnosis not present

## 2016-11-10 DIAGNOSIS — J45909 Unspecified asthma, uncomplicated: Secondary | ICD-10-CM | POA: Diagnosis not present

## 2016-11-10 NOTE — Telephone Encounter (Signed)
Left pt a message to call back. 

## 2016-11-10 NOTE — Progress Notes (Signed)
Subjective:    Patient ID: Suzanne Hansen, female    DOB: 1956/02/01, 60 y.o.   MRN: [id number]  HPI   Here with 2-3 days acute onset fever, facial pain, pressure, headache, general weakness and malaise, and greenish d/c, with mild ST and cough, but pt denies chest pain, wheezing, increased sob or doe, orthopnea, PND, increased LE swelling, palpitations, dizziness or syncope. Also with left lower mandibular aching without skin change, tender or swelling.  Pt denies new neurological symptoms such as new headache, or facial or extremity weakness or numbness   Pt denies polydipsia, polyuria Past Medical History:  Diagnosis Date  . ALLERGIC RHINITIS 05/09/2008  . Allergy   . ANXIETY 05/23/2007  . ARM PAIN, LEFT 10/23/2009  . Asthma   . ASTHMA 05/23/2007  . Cardiomyopathy ([place]) 09/08/2015  . CONTACT DERMATITIS 06/09/2009  . Diabetes mellitus   . ENDOMETRIOSIS NOS 05/23/2007  . FACIAL PAIN 06/02/2010  . FREQUENCY, URINARY 05/17/2010  . GLUCOSE INTOLERANCE 08/28/2009  . HERPES SIMPLEX, UNCOMPLICATED [id number]  . Hyperlipidemia 08/28/2015  . HYPERTHYROIDISM 05/23/2007  . Hypoactive thyroid   . HYPOTHYROIDISM 05/09/2008  . Impaired glucose tolerance 01/28/2011  . INSOMNIA, HX OF 05/23/2007  . LATERAL EPICONDYLITIS, RIGHT 03/10/2009  . LIPOMA 10/22/2010  . NECK PAIN 11/10/2010  . OBESITY 05/23/2007  . Plantar fasciitis    Both feet  . SHOULDER PAIN, LEFT 05/17/2010  . SINUSITIS- ACUTE-NOS 11/03/2008  . SKIN LESION 11/10/2010  . Unspecified Chest Pain 07/25/2008  . UNSPECIFIED URTICARIA 06/04/2009  . URI 08/28/2009  . UTI 04/29/2008  . VITAMIN D DEFICIENCY 08/28/2009  . Wheezing 07/12/2010   Past Surgical History:  Procedure Laterality Date  . ENDOMETRIAL ABLATION    . LAMINECTOMY  1996  . LAPAROTOMY     exploratory  . s/p endometrial ablation  12/06  . TUBAL LIGATION    . UTERINE FIBROID SURGERY    . WISDOM TOOTH EXTRACTION      reports that she quit smoking about 29 years ago. Her smoking use  included Cigarettes. She smoked 0.50 packs per day. She has never used smokeless tobacco. She reports that she does not drink alcohol or use drugs. family history includes COPD in her father; Cancer in her maternal grandmother; Diabetes in her cousin, mother, and sister; Heart disease in her cousin and mother; Hypertension in her cousin and father; Kidney disease in her sister. Allergies  Allergen Reactions  . Ivp Dye [Iodinated Diagnostic Agents] Shortness Of Breath and Other (See Comments)    Swelling of extremity of the injection site  . Latex Itching    ITCHING AND COLD SORES AROUND MOUTH WHEN LATEX GLOVES USED BY THE DENTIST/HYGENTIST  . Clarithromycin Nausea And Vomiting    REACTION: nausea  vomiting  . Metronidazole Nausea And Vomiting   Current Outpatient Prescriptions on File Prior to Visit  Medication Sig Dispense Refill  . aspirin 81 MG EC tablet Take 81 mg by mouth daily.      . budesonide-formoterol (SYMBICORT) 160-4.5 MCG/ACT inhaler INHALE 2 PUFFS INTO THE LUNGS 2 (TWO) TIMES DAILY. 10.2 Inhaler 2  . Calcium Citrate-Vitamin D (CITRACAL PETITES/VITAMIN D PO) Take 1 tablet by mouth daily.    . carvedilol (COREG) 3.125 MG tablet Take 1 tablet (3.125 mg total) by mouth 2 (two) times daily. 180 tablet 3  . chlorpheniramine-HYDROcodone (TUSSIONEX PENNKINETIC ER) 10-8 MG/5ML SUER Take 5 mLs by mouth every 12 (twelve) hours as needed for cough. 140 mL 0  . Cholecalciferol (VITAMIN D) 2000  UNITS CAPS Take 2 capsules by mouth daily.    . Diclofenac Sodium 2 % SOLN Place 1 application onto the skin 2 (two) times daily. 112 g 11  . estradiol (VIVELLE-DOT) 0.05 MG/24HR patch Place 1 patch onto the skin 2 (two) times a week.    . Ferrous Sulfate (IRON) 325 (65 FE) MG TABS Take 1 tablet by mouth daily.    . fexofenadine (ALLEGRA) 180 MG tablet Take 180 mg by mouth daily.      . fluconazole (DIFLUCAN) 150 MG tablet 1 tab by mouth every 3 days as needed 2 tablet 1  . furosemide (LASIX) 40 MG  tablet TAKE 1 TABLET (40 MG TOTAL) BY MOUTH DAILY. 90 tablet 3  . levofloxacin (LEVAQUIN) 500 MG tablet Take 1 tablet (500 mg total) by mouth daily. 10 tablet 0  . levothyroxine (SYNTHROID, LEVOTHROID) 125 MCG tablet Take 1 tablet (125 mcg total) by mouth daily. 90 tablet 3  . losartan (COZAAR) 50 MG tablet Take 1 tablet (50 mg total) by mouth daily. 90 tablet 3  . magnesium oxide (MAG-OX) 400 MG tablet Take 400 mg by mouth daily.    . meloxicam (MOBIC) 15 MG tablet Take 1 tablet (15 mg total) by mouth daily. 30 tablet 5  . Multiple Vitamin (MULTIVITAMIN) capsule Take 1 capsule by mouth daily.      . predniSONE (DELTASONE) 10 MG tablet Take 1 tablet (10 mg total) by mouth daily. 3 tabs by mouth per day for 3 days, then 2 tabs per day for 3 days, then 1 tab per day for 3 days, then stop 18 tablet 0  . PROAIR HFA 108 (90 Base) MCG/ACT inhaler INHALE 2 PUFFS INTO THE LUNGS EVERY 4 (FOUR) HOURS AS NEEDED FOR WHEEZING OR SHORTNESS OF BREATH. 8.5 Inhaler 1  . progesterone (PROMETRIUM) 100 MG capsule Take 100 mg by mouth daily.    . rosuvastatin (CRESTOR) 10 MG tablet TAKE 1 TABLET BY MOUTH EVERY DAY 90 tablet 3  . Turmeric 500 MG TABS Take 500 mg by mouth daily.     No current facility-administered medications on file prior to visit.     Review of Systems  Constitutional: Negative for unusual diaphoresis or night sweats HENT: Negative for ear swelling or discharge Eyes: Negative for worsening visual haziness  Respiratory: Negative for choking and stridor.   Gastrointestinal: Negative for distension or worsening eructation Genitourinary: Negative for retention or change in urine volume.  Musculoskeletal: Negative for other MSK pain or swelling Skin: Negative for color change and worsening wound Neurological: Negative for tremors and numbness other than noted  Psychiatric/Behavioral: Negative for decreased concentration or agitation other than above   All other system neg per pt    Objective:    Physical Exam BP 130/70   Pulse 90   Resp 20   Wt 230 lb (104.3 kg)   SpO2 95%   BMI 38.87 kg/m  VS noted,  Constitutional: Pt appears in no apparent distress HENT: Head: NCAT.  Right Ear: External ear normal.  Left Ear: External ear normal.  Eyes: . Pupils are equal, round, and reactive to light. Conjunctivae and EOM are normal Bilat tm's with mild erythema.  Max sinus areas mild tender.  Pharynx with mild erythema, no exudate Neck: Normal range of motion. Neck supple.  Cardiovascular: Normal rate and regular rhythm.   Pulmonary/Chest: Effort normal and breath sounds without rales or wheezing.  Neurological: Pt is alert. Not confused , motor grossly intact Skin: Skin is  warm. No rash, no LE edema Psychiatric: Pt behavior is normal. No agitation.  No other new exam findings    Assessment & Plan:

## 2016-11-10 NOTE — Telephone Encounter (Signed)
New message    Wife is calling for rn to call back because she has some questions to ask about surgery  She would not give anymore information

## 2016-11-10 NOTE — Telephone Encounter (Signed)
Suzanne Hansen is returning a call . Thanks

## 2016-11-10 NOTE — Telephone Encounter (Signed)
Pt called to be clear for surgery. Pt is having a Hysterectomy on 12/26/16 with Dr. Arvella Nigh. Pt's last office visit with Dr. Angelena Form was almost 6 months ago on 06/02/16. Pt has a diagnosis of Chronic systolic heart surgery.Pt wants to know if she needs to be seen prior surgery.

## 2016-11-10 NOTE — Patient Instructions (Addendum)
OK to take the doxycycline as directed  OK to finish the prednisone, and none further is needed  Please continue all other medications as before, and refills have been done if requested.  Please have the pharmacy call with any other refills you may need.  Please keep your appointments with your specialists as you may have planned

## 2016-11-10 NOTE — Telephone Encounter (Signed)
Received a fax from [facility]. They need a referral for oral appliance filled out by Dr. Brett Fairy.  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 The overall AHI was 17.2 but the REM AHI was 57/hr, and Dr. Brett Fairy said that REM dependent apnea is effectively treated by cpap only.  When I spoke to pt yesterday, she did report that she tried cpap after this sleep study but did not tolerate it. She did not follow up.  Do you agree to sign a referral for an oral appliance for this pt or should she follow up with you to discuss further?

## 2016-11-10 NOTE — Progress Notes (Signed)
Pre visit review using our clinic review tool, if applicable. No additional management support is needed unless otherwise documented below in the visit note. 

## 2016-11-10 NOTE — Telephone Encounter (Signed)
I spoke with Dr. Brett Fairy. She is agreeable to filling out the oral appliance form for pt. She filled out form for pt. I faxed it back to [facility] and [employer].  I spoke to pt and advised her of this. Pt verbalized understanding.

## 2016-11-11 NOTE — Telephone Encounter (Signed)
Follow Up    Pt returning phone call from [facility].

## 2016-11-11 NOTE — Telephone Encounter (Signed)
Left message to call back  

## 2016-11-11 NOTE — Assessment & Plan Note (Signed)
Stable, no further prednisone needed,  to f/u any worsening symptoms or concerns

## 2016-11-11 NOTE — Telephone Encounter (Signed)
Appointment made with Dr. Angelena Form on 11/25/16 at 10:00 am. Patient verbalized understanding and is in agreement with this plan.

## 2016-11-11 NOTE — Telephone Encounter (Signed)
Burnell Blanks, MD  Thompson Grayer, RN; Lynann Bologna, RN        I would like to have her seen before her surgery either with me or office APP. Gerald Stabs   I placed call to pt to schedule appt. Left message to call back.

## 2016-11-11 NOTE — Assessment & Plan Note (Signed)
Mild to mod, for doxycycline course,  to f/u any worsening symptoms or concerns 

## 2016-11-11 NOTE — Telephone Encounter (Signed)
New Message ° °Pt voiced returning nurses call. ° °Please f/u °

## 2016-11-11 NOTE — Assessment & Plan Note (Signed)
Exam benign, ok to follow with tx as above

## 2016-11-15 ENCOUNTER — Ambulatory Visit (INDEPENDENT_AMBULATORY_CARE_PROVIDER_SITE_OTHER): Payer: BLUE CROSS/BLUE SHIELD | Admitting: Nurse Practitioner

## 2016-11-15 ENCOUNTER — Encounter: Payer: Self-pay | Admitting: Nurse Practitioner

## 2016-11-15 VITALS — BP 110/70 | HR 81 | Ht 64.0 in | Wt 229.0 lb

## 2016-11-15 DIAGNOSIS — R1032 Left lower quadrant pain: Secondary | ICD-10-CM

## 2016-11-15 DIAGNOSIS — R109 Unspecified abdominal pain: Secondary | ICD-10-CM

## 2016-11-15 MED ORDER — NA SULFATE-K SULFATE-MG SULF 17.5-3.13-1.6 GM/177ML PO SOLN: 1.0000 | Freq: Once | ORAL | 0 refills | Status: DC

## 2016-11-15 MED ORDER — NA SULFATE-K SULFATE-MG SULF 17.5-3.13-1.6 GM/177ML PO SOLN: 1.0000 | Freq: Once | ORAL | 0 refills | Status: AC

## 2016-11-15 NOTE — Progress Notes (Signed)
HPI:  Patient is a 61 year old female previously known to Dr. Olevia Perches from a screening colonoscopy in 2012.  She is referred by Juanda Chance, [facility] who has been following her for lower abdominal  / pelvic pain. Patient describes pressure type pain in LLQ. At times the pain is "shooting" and radiates around side into left lower back. The pain is not related to meals nor bowel movements which have been normal.  Pain can be aggravated by certain positions. Pelvic ultrasound revealed some adenomyosis. No left adnexal mass. No urinary symptoms. She was treated in December for possible vaginitis and UTI. Patient is scheduled to have a  Hysterectomy and salpingo oophorectomy in mid March by Dr. Radene Knee. She tells me GYN wants to make sure there is nothing going on in colon prior to her surgery. Aside from the LLQ pain patient gets postprandial indigestion from time to time.    Past Medical History:  Diagnosis Date  . ALLERGIC RHINITIS 05/09/2008  . Allergy   . ANXIETY 05/23/2007  . ARM PAIN, LEFT 10/23/2009  . Asthma   . Cardiomyopathy ([place]) 09/08/2015  . CONTACT DERMATITIS 06/09/2009  . Diabetes mellitus   . ENDOMETRIOSIS NOS 05/23/2007  . GLUCOSE INTOLERANCE 08/28/2009  . HERPES SIMPLEX, UNCOMPLICATED [id number]  . Hyperlipidemia 08/28/2015  . HYPERTHYROIDISM 05/23/2007  . Hypoactive thyroid   . HYPOTHYROIDISM 05/09/2008  . Impaired glucose tolerance 01/28/2011  . INSOMNIA, HX OF 05/23/2007  . LATERAL EPICONDYLITIS, RIGHT 03/10/2009  . LIPOMA 10/22/2010  . OBESITY 05/23/2007  . Plantar fasciitis    Both feet  . UNSPECIFIED URTICARIA 06/04/2009  . VITAMIN D DEFICIENCY 08/28/2009    Past Surgical History:  Procedure Laterality Date  . ENDOMETRIAL ABLATION    . LAMINECTOMY  1996  . LAPAROTOMY     exploratory  . s/p endometrial ablation  12/06  . TUBAL LIGATION    . UTERINE FIBROID SURGERY    . WISDOM TOOTH EXTRACTION     Family History  Problem Relation Age of Onset  . Diabetes Cousin     . Hypertension Cousin   . Heart disease Cousin   . Diabetes Mother   . Heart disease Mother     Rheumatic heart disease  . COPD Father   . Hypertension Father   . Cancer Maternal Grandmother     Breast  . Diabetes Sister   . Kidney disease Sister    Social History  Substance Use Topics  . Smoking status: Former Smoker    Packs/day: 0.50    Types: Cigarettes    Quit date: 11/05/1987  . Smokeless tobacco: Never Used  . Alcohol use No     Comment: WINE   Current Outpatient Prescriptions  Medication Sig Dispense Refill  . aspirin 81 MG EC tablet Take 81 mg by mouth daily.      . budesonide-formoterol (SYMBICORT) 160-4.5 MCG/ACT inhaler INHALE 2 PUFFS INTO THE LUNGS 2 (TWO) TIMES DAILY. 10.2 Inhaler 2  . Calcium Citrate-Vitamin D (CITRACAL PETITES/VITAMIN D PO) Take 1 tablet by mouth daily.    . carvedilol (COREG) 3.125 MG tablet Take 1 tablet (3.125 mg total) by mouth 2 (two) times daily. 180 tablet 3  . chlorpheniramine-HYDROcodone (TUSSIONEX PENNKINETIC ER) 10-8 MG/5ML SUER Take 5 mLs by mouth every 12 (twelve) hours as needed for cough. 140 mL 0  . Cholecalciferol (VITAMIN D) 2000 UNITS CAPS Take 2 capsules by mouth daily.    . Diclofenac Sodium 2 % SOLN Place 1 application  onto the skin 2 (two) times daily. 112 g 11  . estradiol (VIVELLE-DOT) 0.05 MG/24HR patch Place 1 patch onto the skin 2 (two) times a week.    . Ferrous Sulfate (IRON) 325 (65 FE) MG TABS Take 1 tablet by mouth daily.    . fexofenadine (ALLEGRA) 180 MG tablet Take 180 mg by mouth daily.      . fluconazole (DIFLUCAN) 150 MG tablet 1 tab by mouth every 3 days as needed 2 tablet 1  . furosemide (LASIX) 40 MG tablet TAKE 1 TABLET (40 MG TOTAL) BY MOUTH DAILY. 90 tablet 3  . levothyroxine (SYNTHROID, LEVOTHROID) 125 MCG tablet Take 1 tablet (125 mcg total) by mouth daily. 90 tablet 3  . losartan (COZAAR) 50 MG tablet Take 1 tablet (50 mg total) by mouth daily. 90 tablet 3  . magnesium oxide (MAG-OX) 400 MG tablet  Take 400 mg by mouth daily.    . meloxicam (MOBIC) 15 MG tablet Take 1 tablet (15 mg total) by mouth daily. 30 tablet 5  . Multiple Vitamin (MULTIVITAMIN) capsule Take 1 capsule by mouth daily.      [name] PROAIR HFA 108 (90 Base) MCG/ACT inhaler INHALE 2 PUFFS INTO THE LUNGS EVERY 4 (FOUR) HOURS AS NEEDED FOR WHEEZING OR SHORTNESS OF BREATH. 8.5 Inhaler 1  . rosuvastatin (CRESTOR) 10 MG tablet TAKE 1 TABLET BY MOUTH EVERY DAY 90 tablet 3  . Turmeric 500 MG TABS Take 500 mg by mouth daily.     No current facility-administered medications for this visit.    Allergies  Allergen Reactions  . Ivp Dye [Iodinated Diagnostic Agents] Shortness Of Breath and Other (See Comments)    Swelling of extremity of the injection site  . Latex Itching    ITCHING AND COLD SORES AROUND MOUTH WHEN LATEX GLOVES USED BY THE DENTIST/HYGENTIST  . Clarithromycin Nausea And Vomiting    REACTION: nausea  vomiting  . Metronidazole Nausea And Vomiting     Review of Systems: Positive for allergy, sinus trouble, swelling of feet and legs. All systems reviewed and negative except where noted in HPI.   Physical Exam: BP 110/70   Pulse 81   Ht 5\' 4"  (1.626 m)   Wt 229 lb (103.9 kg)   BMI 39.31 kg/m  Constitutional:  Well-developed, female in no acute distress. Psychiatric: Normal mood and affect. Behavior is normal. EENT:  Conjunctivae are normal. No scleral icterus. Neck supple.  Cardiovascular: Normal rate, regular rhythm.  Pulmonary/chest: Effort normal and breath sounds normal. No wheezing, rales or rhonchi. Abdominal: Soft, nondistended, nontender. Bowel sounds active throughout. There are no masses palpable. No hepatomegaly. Negative Carnett's Extremities: no edema Lymphadenopathy: No cervical adenopathy noted. Neurological: Alert and oriented to person place and time. Skin: Skin is warm and dry. No rashes noted.   ASSESSMENT AND PLAN: 1. 61 yo female with several week history of persistent LLQ / left  lower back pain. Patient scheduled to have a hysterectomy and salpingo oophorectomy in mid March for ongoing LLQ / left pelvic pain and findings of adenomyosis on U/S. She was referred here to exclude any colonic involvement. She had a screening colonoscopy in 2012 with removal of a small sessile hyperplastic polyp. By history and exam her pain  doesn't seem GI in nature and though it was only 6 years since last colonoscopy it is reasonable to repeat the exam given circumstances. -Patient will be scheduled for a colonoscopy with possible polypectomy / biopsies.  The risks and benefits of the  procedure were discussed and the patient agrees to proceed.   2. Occasional postprandial indigestion.  -she can take Pepcid AC for occasional indigestion    Tye Savoy, NP  11/15/2016, 2:24 PM  Cc: Juanda Chance, NP

## 2016-11-15 NOTE — Patient Instructions (Signed)
If you are age 60 or older, your body mass index should be between 23-30. Your Body mass index is 39.31 kg/m. If this is out of the aforementioned range listed, please consider follow up with your Primary Care Provider.  If you are age 28 or younger, your body mass index should be between 19-25. Your Body mass index is 39.31 kg/m. If this is out of the aformentioned range listed, please consider follow up with your Primary Care Provider.   We have sent the following medications to your pharmacy for you to pick up at your convenience:  [place] have been scheduled for a colonoscopy. Please follow written instructions given to you at your visit today.  Please pick up your prep supplies at the pharmacy within the next 1-3 days. If you use inhalers (even only as needed), please bring them with you on the day of your procedure. Your physician has requested that you go to www.startemmi.com and enter the access code given to you at your visit today. This web site gives a general overview about your procedure. However, you should still follow specific instructions given to you by our office regarding your preparation for the procedure.

## 2016-11-18 ENCOUNTER — Emergency Department (HOSPITAL_COMMUNITY): Payer: BLUE CROSS/BLUE SHIELD

## 2016-11-18 ENCOUNTER — Encounter (HOSPITAL_COMMUNITY): Payer: Self-pay | Admitting: Emergency Medicine

## 2016-11-18 ENCOUNTER — Emergency Department (HOSPITAL_COMMUNITY)
Admission: EM | Admit: 2016-11-18 | Discharge: 2016-11-18 | Disposition: A | Discharge status: Home / Self Care | Payer: BLUE CROSS/BLUE SHIELD | Attending: Dermatology | Admitting: Dermatology

## 2016-11-18 DIAGNOSIS — Z5321 Procedure and treatment not carried out due to patient leaving prior to being seen by health care provider: Secondary | ICD-10-CM | POA: Insufficient documentation

## 2016-11-18 DIAGNOSIS — R6884 Jaw pain: Secondary | ICD-10-CM | POA: Diagnosis not present

## 2016-11-18 DIAGNOSIS — R079 Chest pain, unspecified: Secondary | ICD-10-CM | POA: Insufficient documentation

## 2016-11-18 LAB — BASIC METABOLIC PANEL
Anion gap: 7 (ref 5–15)
BUN: 18 mg/dL (ref 6–20)
CHLORIDE: 107 mmol/L (ref 101–111)
CO2: 26 mmol/L (ref 22–32)
Calcium: 9.2 mg/dL (ref 8.9–10.3)
Creatinine, Ser: 1.22 mg/dL — ABNORMAL HIGH (ref 0.44–1.00)
GFR calc non Af Amer: 48 mL/min — ABNORMAL LOW (ref >60)
GFR, EST AFRICAN AMERICAN: 55 mL/min — AB (ref >60)
Glucose, Bld: 81 mg/dL (ref 65–99)
Potassium: 3.8 mmol/L (ref 3.5–5.1)
Sodium: 140 mmol/L (ref 135–145)

## 2016-11-18 LAB — CBC
HCT: 43 % (ref 36.0–46.0)
Hemoglobin: 14.5 g/dL (ref 12.0–15.0)
MCH: 31.4 pg (ref 26.0–34.0)
MCHC: 33.7 g/dL (ref 30.0–36.0)
MCV: 93.1 fL (ref 78.0–100.0)
Platelets: 276 10*3/uL (ref 150–400)
RBC: 4.62 MIL/uL (ref 3.87–5.11)
RDW: 13.7 % (ref 11.5–15.5)
WBC: 11.7 10*3/uL — AB (ref 4.0–10.5)

## 2016-11-18 LAB — I-STAT TROPONIN, ED: TROPONIN I, POC: 0 ng/mL (ref 0.00–0.08)

## 2016-11-18 NOTE — ED Notes (Signed)
Pt upset about the wait. She stated "she should not have to wait this long."decided to give me her labels and leave.

## 2016-11-18 NOTE — ED Triage Notes (Signed)
Patient c/o left chest pain, jaw pain, arm pain and SOB that has been going on since Feb 1. Patient was on prednisone for bronchitis.  Patient states that they decreased her estrogen then today they told her to take off patch and go to ED.

## 2016-11-21 ENCOUNTER — Encounter: Payer: Self-pay | Admitting: Gastroenterology

## 2016-11-21 NOTE — Progress Notes (Signed)
Will have to consider imaging CT abd & pelvis with contrast  to further evaluate for LLQ pain, and exclude diverticulitis prior to colonoscopy.  Reviewed and agree with documentation and assessment and plan. Damaris Hippo , MD

## 2016-11-25 ENCOUNTER — Ambulatory Visit: Payer: BLUE CROSS/BLUE SHIELD | Admitting: Cardiovascular Disease

## 2016-12-02 ENCOUNTER — Ambulatory Visit (AMBULATORY_SURGERY_CENTER): Payer: BLUE CROSS/BLUE SHIELD | Admitting: Gastroenterology

## 2016-12-02 ENCOUNTER — Encounter: Payer: Self-pay | Admitting: Gastroenterology

## 2016-12-02 VITALS — BP 113/71 | HR 71 | Temp 96.2°F | Resp 11 | Ht 64.0 in | Wt 229.0 lb

## 2016-12-02 DIAGNOSIS — D123 Benign neoplasm of transverse colon: Secondary | ICD-10-CM | POA: Diagnosis not present

## 2016-12-02 DIAGNOSIS — R1032 Left lower quadrant pain: Secondary | ICD-10-CM

## 2016-12-02 DIAGNOSIS — R109 Unspecified abdominal pain: Secondary | ICD-10-CM

## 2016-12-02 MED ORDER — SODIUM CHLORIDE 0.9 % IV SOLN: 500.0000 mL | INTRAVENOUS | Status: DC

## 2016-12-02 NOTE — Patient Instructions (Addendum)
YOU HAD AN ENDOSCOPIC PROCEDURE TODAY AT [facility]:   Refer to the procedure report that was given to you for any specific questions about what was found during the examination.  If the procedure report does not answer your questions, please call your gastroenterologist to clarify.  If you requested that your care partner not be given the details of your procedure findings, then the procedure report has been included in a sealed envelope for you to review at your convenience later.  YOU SHOULD EXPECT: Some feelings of bloating in the abdomen. Passage of more gas than usual.  Walking can help get rid of the air that was put into your GI tract during the procedure and reduce the bloating. If you had a lower endoscopy (such as a colonoscopy or flexible sigmoidoscopy) you may notice spotting of blood in your stool or on the toilet paper. If you underwent a bowel prep for your procedure, you may not have a normal bowel movement for a few days.  Please Note:  You might notice some irritation and congestion in your nose or some drainage.  This is from the oxygen used during your procedure.  There is no need for concern and it should clear up in a day or so.  SYMPTOMS TO REPORT IMMEDIATELY:   Following lower endoscopy (colonoscopy or flexible sigmoidoscopy):  Excessive amounts of blood in the stool  Significant tenderness or worsening of abdominal pains  Swelling of the abdomen that is new, acute  Fever of 100F or higher   For urgent or emergent issues, a gastroenterologist can be reached at any hour by calling [phone].   DIET:  We do recommend a small meal at first, but then you may proceed to your regular diet.  Drink plenty of fluids but you should avoid alcoholic beverages for 24 hours.  ACTIVITY:  You should plan to take it easy for the rest of today and you should NOT DRIVE or use heavy machinery until tomorrow (because of the sedation medicines used during the test).     FOLLOW UP: Our staff will call the number listed on your records the next business day following your procedure to check on you and address any questions or concerns that you may have regarding the information given to you following your procedure. If we do not reach you, we will leave a message.  However, if you are feeling well and you are not experiencing any problems, there is no need to return our call.  We will assume that you have returned to your regular daily activities without incident.  If any biopsies were taken you will be contacted by phone or by letter within the next 1-3 weeks.  Please call us at [phone] if you have not heard about the biopsies in 3 weeks.   Await for biopsy results to determined next repeat Colonoscopy in possible 5-10 years. Return to GI clinic as needed Polyps (handout given)   SIGNATURES/CONFIDENTIALITY: You and/or your care partner have signed paperwork which will be entered into your electronic medical record.  These signatures attest to the fact that that the information above on your After Visit Summary has been reviewed and is understood.  Full responsibility of the confidentiality of this discharge information lies with you and/or your care-partner.

## 2016-12-02 NOTE — Progress Notes (Signed)
Report to PACU, RN, vss, BBS= Clear.  

## 2016-12-02 NOTE — Op Note (Signed)
[facility] Patient Name: Suzanne Hansen Procedure Date: 12/02/2016 7:52 AM MRN: [id number] Endoscopist: Mauri Pole , MD Age: 61 Referring MD:  Date of Birth: 27-Sep-1956 Gender: Female Account #: [id number] Procedure:                Colonoscopy Indications:              Abdominal pain in the left lower quadrant, Lower                            abdominal pain Medicines:                Monitored Anesthesia Care Procedure:                Pre-Anesthesia Assessment:                           - Prior to the procedure, a History and Physical                            was performed, and patient medications and                            allergies were reviewed. The patient's tolerance of                            previous anesthesia was also reviewed. The risks                            and benefits of the procedure and the sedation                            options and risks were discussed with the patient.                            All questions were answered, and informed consent                            was obtained. Prior Anticoagulants: The patient has                            taken no previous anticoagulant or antiplatelet                            agents. ASA Grade Assessment: II - A patient with                            mild systemic disease. After reviewing the risks                            and benefits, the patient was deemed in                            satisfactory condition to undergo the procedure.  After obtaining informed consent, the colonoscope                            was passed under direct vision. Throughout the                            procedure, the patient's blood pressure, pulse, and                            oxygen saturations were monitored continuously. The                            Model CF-HQ190L [id number]) scope was introduced                            through the anus and advanced to the the  cecum,                            identified by appendiceal orifice and ileocecal                            valve. The colonoscopy was performed without                            difficulty. The patient tolerated the procedure                            well. The quality of the bowel preparation was                            excellent. The ileocecal valve, appendiceal                            orifice, and rectum were photographed. Scope In: 8:38:14 AM Scope Out: 8:56:22 AM Scope Withdrawal Time: 0 hours 15 minutes 59 seconds  Total Procedure Duration: 0 hours 18 minutes 8 seconds  Findings:                 The perianal and digital rectal examinations were                            normal.                           A 9 mm polyp was found in the transverse colon. The                            polyp was granular lateral spreading. The polyp was                            removed with a cold snare. Resection and retrieval                            were complete.  The exam was otherwise without abnormality. Complications:            No immediate complications. Estimated Blood Loss:     Estimated blood loss was minimal. Impression:               - One 9 mm polyp in the transverse colon, removed                            with a cold snare. Resected and retrieved.                           - The examination was otherwise normal. Recommendation:           - Patient has a contact number available for                            emergencies. The signs and symptoms of potential                            delayed complications were discussed with the                            patient. Return to normal activities tomorrow.                            Written discharge instructions were provided to the                            patient.                           - Resume previous diet.                           - Continue present medications.                            - Await pathology results.                           - Repeat colonoscopy in 5-10 years for surveillance                            based on pathology results.                           - Return to GI clinic PRN. Mauri Pole, MD 12/02/2016 9:03:33 AM This report has been signed electronically.

## 2016-12-02 NOTE — Progress Notes (Signed)
Called to room to assist during endoscopic procedure.  Patient ID and intended procedure confirmed with present staff. Received instructions for my participation in the procedure from the performing physician.  

## 2016-12-05 ENCOUNTER — Telehealth: Payer: Self-pay

## 2016-12-05 NOTE — Telephone Encounter (Signed)
  Follow up Call-  Call back number 12/02/2016  Post procedure Call Back phone  # [phone]  Permission to leave phone message Yes  Some recent data might be hidden     Patient questions:  Do you have a fever, pain , or abdominal swelling? No. Pain Score  0 *  Have you tolerated food without any problems? Yes.    Have you been able to return to your normal activities? Yes.    Do you have any questions about your discharge instructions: Diet   No. Medications  No. Follow up visit  No.  Do you have questions or concerns about your Care? No.  Actions: * If pain score is 4 or above: No action needed, pain <4.

## 2016-12-09 ENCOUNTER — Encounter: Payer: Self-pay | Admitting: Gastroenterology

## 2016-12-26 ENCOUNTER — Ambulatory Visit: Admit: 2016-12-26 | Payer: BLUE CROSS/BLUE SHIELD | Admitting: Obstetrics and Gynecology

## 2016-12-26 SURGERY — HYSTERECTOMY, VAGINAL, LAPAROSCOPY-ASSISTED, WITH SALPINGO-OOPHORECTOMY
Anesthesia: General | Laterality: Bilateral

## 2016-12-27 ENCOUNTER — Telehealth: Payer: Self-pay | Admitting: Family Medicine

## 2016-12-27 ENCOUNTER — Other Ambulatory Visit: Payer: Self-pay

## 2016-12-27 MED ORDER — DICLOFENAC SODIUM 2 % TD SOLN: 1.0000 "application " | Freq: Two times a day (BID) | TRANSDERMAL | 3 refills | Status: DC

## 2016-12-27 NOTE — Progress Notes (Unsigned)
pwenn

## 2016-12-27 NOTE — Telephone Encounter (Signed)
Reordered medication per Dr. Tamala Julian. Sent medication into [facility] instead of [facility].

## 2016-12-27 NOTE — Telephone Encounter (Signed)
Pt called stating the P[place] that was prescribed to her, the insurance wouldn't cover it because it doesn't show she has arthritis of the knee.  Clearly on her last office visit last year (not sure why it's coming up now?) it states she was diagnosed with degenerative arthritis of both knees.  She is calling her insurance back Can you please call her to see if there is anything we can do

## 2017-01-02 ENCOUNTER — Other Ambulatory Visit: Payer: Self-pay | Admitting: Internal Medicine

## 2017-02-06 NOTE — Progress Notes (Signed)
[name] [address], [place] [address] Phone: [phone] Subjective:    I'm seeing this patient by the request  of:  Cathlean Cower, MD   CC: Knee pain  PRX:[id number]  Suzanne Hansen is a 61 y.o. female coming in with complaint of knee pain. Was seen one year ago and was given injections in the knees bilaterally for more of a patellofemoral syndrome. Patient states Still having pain overall. Does seem to be improving. Has been a long time but now is having worsening symptoms. Patient states that this is seems to unfortunately occur fairly regularly. since it is now affecting daily activities. Waking her up at night as well.     Past Medical History:  Diagnosis Date  . ALLERGIC RHINITIS 05/09/2008  . Allergy   . ANXIETY 05/23/2007  . ARM PAIN, LEFT 10/23/2009  . Asthma   . ASTHMA 05/23/2007  . Cardiomyopathy ([facility]) 09/08/2015  . CHF (congestive heart failure) ([place])   . CONTACT DERMATITIS 06/09/2009  . Diabetes mellitus   . ENDOMETRIOSIS NOS 05/23/2007  . FACIAL PAIN 06/02/2010  . FREQUENCY, URINARY 05/17/2010  . GLUCOSE INTOLERANCE 08/28/2009  . HERPES SIMPLEX, UNCOMPLICATED 04/06/6380  . Hyperlipidemia 08/28/2015  . HYPERTHYROIDISM 05/23/2007  . Hypoactive thyroid   . HYPOTHYROIDISM 05/09/2008  . Impaired glucose tolerance 01/28/2011  . INSOMNIA, HX OF 05/23/2007  . LATERAL EPICONDYLITIS, RIGHT 03/10/2009  . LIPOMA 10/22/2010  . NECK PAIN 11/10/2010  . OBESITY 05/23/2007  . Plantar fasciitis    Both feet  . SHOULDER PAIN, LEFT 05/17/2010  . SINUSITIS- ACUTE-NOS 11/03/2008  . SKIN LESION 11/10/2010  . Sleep apnea   . Unspecified Chest Pain 07/25/2008  . UNSPECIFIED URTICARIA 06/04/2009  . URI 08/28/2009  . UTI 04/29/2008  . VITAMIN D DEFICIENCY 08/28/2009  . Wheezing 07/12/2010   Past Surgical History:  Procedure Laterality Date  . COLONOSCOPY    . ENDOMETRIAL ABLATION    . LAMINECTOMY  1996  . LAPAROTOMY     exploratory  . POLYPECTOMY    .  s/p endometrial ablation  12/06  . TUBAL LIGATION    . UTERINE FIBROID SURGERY    . WISDOM TOOTH EXTRACTION     Social History   Social History  . Marital status: Divorced    Spouse name: N/A  . Number of children: 3  . Years of education: N/A   Occupational History  . 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 Smoking status: Former Smoker    Packs/day: 0.50    Types: Cigarettes    Quit date: 11/05/1987  . Smokeless tobacco: Never Used  . Alcohol use No     Comment: WINE  . Drug use: No  . Sexual activity: Not Asked   Other Topics Concern  . None   Social History Narrative  . None   Allergies  Allergen Reactions  . Ivp Dye [Iodinated Diagnostic Agents] Shortness Of Breath and Other (See Comments)    Swelling of extremity of the injection site  . Latex Itching    ITCHING AND COLD SORES AROUND MOUTH WHEN LATEX GLOVES USED BY THE DENTIST/HYGENTIST  . Clarithromycin Nausea And Vomiting    REACTION: nausea  vomiting  . Metronidazole Nausea And Vomiting   Family History  Problem Relation Age of Onset  . Diabetes Cousin   . Hypertension Cousin   . Heart disease Cousin   . Heart disease Mother  Rheumatic heart disease  . COPD Father   . Hypertension Father   . Cancer Maternal Grandmother     Breast  . Diabetes Sister   . Kidney disease Sister     Past medical history, social, surgical and family history all reviewed in electronic medical record.  No pertanent information unless stated regarding to the chief complaint.   Review of Systems:Review of systems updated and as accurate as of 02/07/17  No headache, visual changes, nausea, vomiting, diarrhea, constipation, dizziness, abdominal pain, skin rash, fevers, chills, night sweats, weight loss, swollen lymph nodes, body aches, joint swelling, muscle aches, chest pain, shortness of breath, mood changes.   Objective  Blood pressure 102/80, pulse 68, resp. rate 16,  weight 233 lb 4 oz (105.8 kg), SpO2 97 %. Systems examined below as of 02/07/17   General: No apparent distress alert and oriented x3 mood and affect normal, dressed appropriately.  HEENT: Pupils equal, extraocular movements intact  Respiratory: Patient's speak in full sentences and does not appear short of breath  Cardiovascular: No lower extremity edema, non tender, no erythema  Skin: Warm dry intact with no signs of infection or rash on extremities or on axial skeleton.  Abdomen: Soft nontender  Neuro: Cranial nerves II through XII are intact, neurovascularly intact in all extremities with 2+ DTRs and 2+ pulses.  Lymph: No lymphadenopathy of posterior or anterior cervical chain or axillae bilaterally.  Gait normal with good balance and coordination.  MSK:  Non tender with full range of motion and good stability and symmetric strength and tone of shoulders, elbows, wrist, hip, and ankles bilaterally.  Knee: Bilateral Mild lateral tilt of the patella noted Tender over the patellofemoral joint bilaterally ROM full in flexion and extension and lower leg rotation. Ligaments with solid consistent endpoints including ACL, PCL, LCL, MCL. Negative Mcmurray's, Apley's, and Thessalonian tests. painful patellar compression. Patellar glide with moderate crepitus. Patellar and quadriceps tendons unremarkable. Hamstring and quadriceps strength is normal.   After informed written and verbal consent, patient was seated on exam table. Right knee was prepped with alcohol swab and utilizing anterolateral approach, patient's right knee space was injected with 4:1  marcaine 0.5%: Kenalog 40mg /dL. Patient tolerated the procedure well without immediate complications.  After informed written and verbal consent, patient was seated on exam table. Left knee was prepped with alcohol swab and utilizing anterolateral approach, patient's left knee space was injected with 4:1  marcaine 0.5%: Kenalog 40mg /dL. Patient  tolerated the procedure well without immediate complications.   Impression and Recommendations:     This case required medical decision making of moderate complexity.      Note: This dictation was prepared with Dragon dictation along with smaller phrase technology. Any transcriptional errors that result from this process are unintentional.

## 2017-02-07 ENCOUNTER — Other Ambulatory Visit: Payer: Self-pay | Admitting: Internal Medicine

## 2017-02-07 ENCOUNTER — Encounter: Payer: Self-pay | Admitting: Family Medicine

## 2017-02-07 ENCOUNTER — Ambulatory Visit: Payer: BLUE CROSS/BLUE SHIELD | Admitting: Family Medicine

## 2017-02-07 ENCOUNTER — Ambulatory Visit (INDEPENDENT_AMBULATORY_CARE_PROVIDER_SITE_OTHER): Payer: BLUE CROSS/BLUE SHIELD | Admitting: Family Medicine

## 2017-02-07 DIAGNOSIS — M25562 Pain in left knee: Secondary | ICD-10-CM

## 2017-02-07 DIAGNOSIS — M1710 Unilateral primary osteoarthritis, unspecified knee: Secondary | ICD-10-CM

## 2017-02-07 DIAGNOSIS — M25561 Pain in right knee: Secondary | ICD-10-CM | POA: Diagnosis not present

## 2017-02-07 MED ORDER — MELOXICAM 15 MG PO TABS: 15.0000 mg | ORAL_TABLET | Freq: Every day | ORAL | 2 refills | Status: DC

## 2017-02-07 NOTE — Assessment & Plan Note (Signed)
Worsening symptoms overall. Patellofemoral arthritis. We discussed with patient at great length and has elected try the injections. Refilled meloxicam secondary to the worsening symptoms. Decline formal physical therapy at this point. Has failed all other conservative therapy. Easily into a candidate for viscous supplementation if needed.

## 2017-02-07 NOTE — Patient Instructions (Addendum)
Good to see you  Suzanne Hansen is your friend. Ice 20 minutes 2 times daily. Usually after activity and before bed. Meloxicam daily as needed.  Stay active Good shoes See me again 4-6 weeks

## 2017-02-08 ENCOUNTER — Other Ambulatory Visit: Payer: Self-pay | Admitting: Internal Medicine

## 2017-02-09 ENCOUNTER — Other Ambulatory Visit: Payer: Self-pay | Admitting: *Deleted

## 2017-02-09 NOTE — Telephone Encounter (Signed)
Received fax for refill on meloxicam. Per chart med already refilled by dr. Tamala Julian on 02/07/17...Suzanne Hansen

## 2017-03-18 NOTE — Progress Notes (Signed)
[name] [address], [place] [address] Phone: [phone] Subjective:    I'm seeing this patient by the request  of:  Biagio Borg, MD   CC: Knee pain f/u  [id number]  Suzanne Hansen is a 61 y.o. female coming in with complaint of knee pain. Was seen one year ago and was given injections in the knees bilaterally for more of a patellofemoral syndrome. Patient was given injections in the knees bilaterally greater than one month ago. Patient states and was doing better for 2 weeks and on the pain is back again. Not as severe than some improvement. Patient is more concerning the with cramping of the legs in the hands. States that even with activity she can have severe cramping that stops her from moving her hand for multiple minutes. Patient is concerned because it is affecting daily activities and seeming to become more frequent. Did see cardiologist with recent labs that seemed to be unremarkable. I did review these labs in their entirety in a different electronic medical record. CBC, complete metabolic panel, magnesium were all within normal range. Vitamin D was sent 38.     Past Medical History:  Diagnosis Date  . ALLERGIC RHINITIS 05/09/2008  . Allergy   . ANXIETY 05/23/2007  . ARM PAIN, LEFT 10/23/2009  . Asthma   . ASTHMA 05/23/2007  . Cardiomyopathy ([facility]) 09/08/2015  . CHF (congestive heart failure) ([place])   . CONTACT DERMATITIS 06/09/2009  . Diabetes mellitus   . ENDOMETRIOSIS NOS 05/23/2007  . FACIAL PAIN 06/02/2010  . FREQUENCY, URINARY 05/17/2010  . GLUCOSE INTOLERANCE 08/28/2009  . HERPES SIMPLEX, UNCOMPLICATED 7/67/3419  . Hyperlipidemia 08/28/2015  . HYPERTHYROIDISM 05/23/2007  . Hypoactive thyroid   . HYPOTHYROIDISM 05/09/2008  . Impaired glucose tolerance 01/28/2011  . INSOMNIA, HX OF 05/23/2007  . LATERAL EPICONDYLITIS, RIGHT 03/10/2009  . LIPOMA 10/22/2010  . NECK PAIN 11/10/2010  . OBESITY 05/23/2007  . Plantar fasciitis    Both  feet  . SHOULDER PAIN, LEFT 05/17/2010  . SINUSITIS- ACUTE-NOS 11/03/2008  . SKIN LESION 11/10/2010  . Sleep apnea   . Unspecified Chest Pain 07/25/2008  . UNSPECIFIED URTICARIA 06/04/2009  . URI 08/28/2009  . UTI 04/29/2008  . VITAMIN D DEFICIENCY 08/28/2009  . Wheezing 07/12/2010   Past Surgical History:  Procedure Laterality Date  . COLONOSCOPY    . ENDOMETRIAL ABLATION    . LAMINECTOMY  1996  . LAPAROTOMY     exploratory  . POLYPECTOMY    . s/p endometrial ablation  12/06  . TUBAL LIGATION    . UTERINE FIBROID SURGERY    . WISDOM TOOTH EXTRACTION     Social History   Social History  . Marital status: Divorced    Spouse name: N/A  . Number of children: 3  . Years of education: N/A   Occupational History  . 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 Smoking status: Former Smoker    Packs/day: 0.50    Types: Cigarettes    Quit date: 11/05/1987  . Smokeless tobacco: Never Used  . Alcohol use No     Comment: WINE  . Drug use: No  . Sexual activity: Not on file   Other Topics Concern  . Not on file   Social History Narrative  . No narrative on file   Allergies  Allergen Reactions  . Ivp Dye [Iodinated Diagnostic Agents] Shortness Of Breath and  Other (See Comments)    Swelling of extremity of the injection site  . Latex Itching    ITCHING AND COLD SORES AROUND MOUTH WHEN LATEX GLOVES USED BY THE DENTIST/HYGENTIST  . Clarithromycin Nausea And Vomiting    REACTION: nausea  vomiting  . Metronidazole Nausea And Vomiting   Family History  Problem Relation Age of Onset  . Diabetes Cousin   . Hypertension Cousin   . Heart disease Cousin   . Heart disease Mother        Rheumatic heart disease  . COPD Father   . Hypertension Father   . Cancer Maternal Grandmother        Breast  . Diabetes Sister   . Kidney disease Sister     Past medical history, social, surgical and family history all reviewed in electronic medical  record.  No pertanent information unless stated regarding to the chief complaint.   Review of Systems: No headache, visual changes, nausea, vomiting, diarrhea, constipation, dizziness, abdominal pain, skin rash, fevers, chills, night sweats, weight loss, swollen lymph nodes, body aches, joint swelling, muscle aches, chest pain, shortness of breath, mood changes.    Objective  There were no vitals taken for this visit.   Systems examined below as of 03/20/17 General: NAD A&O x3 mood, affect normal  HEENT: Pupils equal, extraocular movements intact no nystagmus Respiratory: not short of breath at rest or with speaking Cardiovascular: No lower extremity edema, non tender Skin: Warm dry intact with no signs of infection or rash on extremities or on axial skeleton. Abdomen: Soft nontender, no masses Neuro: Cranial nerves  intact, neurovascularly intact in all extremities with 2+ DTRs and 2+ pulses. Lymph: No lymphadenopathy appreciated today  Gait normal with good balance and coordination.  MSK: Mild tender with full range of motion and good stability and symmetric strength and tone of shoulders, elbows, wrist,  hips and ankles bilaterally.   Knee: Bilateral Normal to inspection with no erythema or effusion or obvious bony abnormalities. Mild tenderness still over the patellofemoral joint. ROM full in flexion and extension and lower leg rotation. Ligaments with solid consistent endpoints including ACL, PCL, LCL, MCL. Negative Mcmurray's, Apley's, and Thessalonian tests. Non painful patellar compression. Patellar glide without crepitus. Patellar and quadriceps tendons unremarkable. Hamstring and quadriceps strength is normal.  ..   Impression and Recommendations:     This case required medical decision making of moderate complexity.      Note: This dictation was prepared with Dragon dictation along with smaller phrase technology. Any transcriptional errors that result from this  process are unintentional.

## 2017-03-20 ENCOUNTER — Ambulatory Visit: Payer: BLUE CROSS/BLUE SHIELD | Admitting: Family Medicine

## 2017-03-20 ENCOUNTER — Ambulatory Visit (INDEPENDENT_AMBULATORY_CARE_PROVIDER_SITE_OTHER): Payer: BLUE CROSS/BLUE SHIELD | Admitting: Family Medicine

## 2017-03-20 ENCOUNTER — Encounter: Payer: Self-pay | Admitting: Family Medicine

## 2017-03-20 ENCOUNTER — Other Ambulatory Visit: Payer: BLUE CROSS/BLUE SHIELD

## 2017-03-20 VITALS — BP 110/72 | HR 88 | Ht 64.5 in | Wt 232.0 lb

## 2017-03-20 DIAGNOSIS — R252 Cramp and spasm: Secondary | ICD-10-CM | POA: Insufficient documentation

## 2017-03-20 DIAGNOSIS — M170 Bilateral primary osteoarthritis of knee: Secondary | ICD-10-CM

## 2017-03-20 DIAGNOSIS — M1710 Unilateral primary osteoarthritis, unspecified knee: Secondary | ICD-10-CM

## 2017-03-20 NOTE — Assessment & Plan Note (Signed)
No improvement. Patient declined formal physical therapy as well as viscous supplementation at this time. Encourage weight loss or tingling home exercises. Patient has been somewhat noncompliant. Patient wants to try this for a little bit longer any worsening pain will come back.

## 2017-03-20 NOTE — Patient Instructions (Signed)
Good to see you  I am glad knees are better  I do want to make sure labs are ok.  I can inject knees every 3 months or I have other injections.  Will contact you about labs when done.  We will discuss follow up later as well keep doing what you are doing.

## 2017-03-20 NOTE — Assessment & Plan Note (Signed)
Patient is having muscle cramping. Seems to be with even regular daily activities. Seems to be in all the extremities. Do feel that further evaluation including laboratory workup could be beneficial. We'll rule out any autoimmune diseases that could be contributing as well as calcific [place] was done and follow-up with me again in 4 weeks.

## 2017-03-21 ENCOUNTER — Other Ambulatory Visit (INDEPENDENT_AMBULATORY_CARE_PROVIDER_SITE_OTHER): Payer: BLUE CROSS/BLUE SHIELD

## 2017-03-21 ENCOUNTER — Other Ambulatory Visit: Payer: Self-pay | Admitting: Internal Medicine

## 2017-03-21 ENCOUNTER — Telehealth: Payer: Self-pay

## 2017-03-21 DIAGNOSIS — R252 Cramp and spasm: Secondary | ICD-10-CM | POA: Diagnosis not present

## 2017-03-21 DIAGNOSIS — Z0001 Encounter for general adult medical examination with abnormal findings: Secondary | ICD-10-CM | POA: Diagnosis not present

## 2017-03-21 DIAGNOSIS — E039 Hypothyroidism, unspecified: Secondary | ICD-10-CM

## 2017-03-21 DIAGNOSIS — E119 Type 2 diabetes mellitus without complications: Secondary | ICD-10-CM

## 2017-03-21 LAB — CBC WITH DIFFERENTIAL/PLATELET
BASOS ABS: 0.1 10*3/uL (ref 0.0–0.1)
BASOS PCT: 0.8 % (ref 0.0–3.0)
Eosinophils Absolute: 0 10*3/uL (ref 0.0–0.7)
Eosinophils Relative: 0.5 % (ref 0.0–5.0)
HEMATOCRIT: 39 % (ref 36.0–46.0)
Hemoglobin: 13.2 g/dL (ref 12.0–15.0)
LYMPHS ABS: 2.1 10*3/uL (ref 0.7–4.0)
LYMPHS PCT: 32.3 % (ref 12.0–46.0)
MCHC: 33.9 g/dL (ref 30.0–36.0)
MCV: 91.4 fl (ref 78.0–100.0)
MONOS PCT: 8.2 % (ref 3.0–12.0)
Monocytes Absolute: 0.5 10*3/uL (ref 0.1–1.0)
NEUTROS ABS: 3.8 10*3/uL (ref 1.4–7.7)
NEUTROS PCT: 58.2 % (ref 43.0–77.0)
PLATELETS: 268 10*3/uL (ref 150.0–400.0)
RBC: 4.26 Mil/uL (ref 3.87–5.11)
RDW: 12.7 % (ref 11.5–15.5)
WBC: 6.5 10*3/uL (ref 4.0–10.5)

## 2017-03-21 LAB — LIPID PANEL
CHOLESTEROL: 124 mg/dL (ref 0–200)
HDL: 67.9 mg/dL (ref >39.00)
LDL Cholesterol: 44 mg/dL (ref 0–99)
NONHDL: 55.79
Total CHOL/HDL Ratio: 2
Triglycerides: 59 mg/dL (ref 0.0–149.0)
VLDL: 11.8 mg/dL (ref 0.0–40.0)

## 2017-03-21 LAB — BASIC METABOLIC PANEL
BUN: 23 mg/dL (ref 6–23)
CALCIUM: 9.8 mg/dL (ref 8.4–10.5)
CO2: 30 meq/L (ref 19–32)
Chloride: 108 mEq/L (ref 96–112)
Creatinine, Ser: 1.07 mg/dL (ref 0.40–1.20)
GFR: 67.28 mL/min (ref >60.00)
GLUCOSE: 104 mg/dL — AB (ref 70–99)
POTASSIUM: 4 meq/L (ref 3.5–5.1)
Sodium: 143 mEq/L (ref 135–145)

## 2017-03-21 LAB — URINALYSIS, ROUTINE W REFLEX MICROSCOPIC
Bilirubin Urine: NEGATIVE
Nitrite: NEGATIVE
PH: 7 (ref 5.0–8.0)
SPECIFIC GRAVITY, URINE: 1.02 (ref 1.000–1.030)
Urine Glucose: NEGATIVE
Urobilinogen, UA: 0.2 (ref 0.0–1.0)

## 2017-03-21 LAB — PROTIME-INR
INR: 1 ratio (ref 0.8–1.0)
Prothrombin Time: 10.4 s (ref 9.6–13.1)

## 2017-03-21 LAB — HEPATIC FUNCTION PANEL
ALBUMIN: 3.9 g/dL (ref 3.5–5.2)
ALT: 28 U/L (ref 0–35)
AST: 19 U/L (ref 0–37)
Alkaline Phosphatase: 62 U/L (ref 39–117)
BILIRUBIN DIRECT: 0.1 mg/dL (ref 0.0–0.3)
TOTAL PROTEIN: 6.8 g/dL (ref 6.0–8.3)
Total Bilirubin: 0.5 mg/dL (ref 0.2–1.2)

## 2017-03-21 LAB — SEDIMENTATION RATE: Sed Rate: 15 mm/hr (ref 0–30)

## 2017-03-21 LAB — MICROALBUMIN / CREATININE URINE RATIO
CREATININE, U: 417.8 mg/dL
MICROALB UR: 2.2 mg/dL — AB (ref 0.0–1.9)
Microalb Creat Ratio: 0.5 mg/g (ref 0.0–30.0)

## 2017-03-21 LAB — HEMOGLOBIN A1C: HEMOGLOBIN A1C: 5.9 % (ref 4.6–6.5)

## 2017-03-21 LAB — C-REACTIVE PROTEIN: CRP: 0.1 mg/dL — AB (ref 0.5–20.0)

## 2017-03-21 LAB — TSH: TSH: 0.05 u[IU]/mL — ABNORMAL LOW (ref 0.35–4.50)

## 2017-03-21 LAB — IRON: Iron: 55 ug/dL (ref 42–145)

## 2017-03-21 MED ORDER — LEVOTHYROXINE SODIUM 112 MCG PO TABS: 112.0000 ug | ORAL_TABLET | Freq: Every day | ORAL | 3 refills | Status: DC

## 2017-03-21 NOTE — Telephone Encounter (Signed)
-----   Message from Biagio Borg, MD sent at 03/21/2017 12:15 PM EDT ----- Left message on MyChart, pt to cont same tx except  The test results show that your current treatment is OK, except the thyroid appears to be mildly overcontrolled.  Please decrease the levothyroxin to 112 mcg per day.  A new prescription will be sent, and you should be called from the office as well.  You could return in 4 weeks to the LAB only, to have this checked again.    Jeramia Saleeby to please inform pt, I will do referral/order/rx

## 2017-03-21 NOTE — Telephone Encounter (Signed)
Patient was informed of results and expressed understanding.

## 2017-03-22 LAB — PTH, INTACT AND CALCIUM
Calcium: 9.5 mg/dL (ref 8.6–10.4)
PTH: 32 pg/mL (ref 14–64)

## 2017-03-22 LAB — CALCIUM, IONIZED: CALCIUM ION: 5.4 mg/dL (ref 4.8–5.6)

## 2017-03-22 LAB — CYCLIC CITRUL PEPTIDE ANTIBODY, IGG: Cyclic Citrullin Peptide Ab: <16 Units

## 2017-03-23 LAB — ANA: ANA: NEGATIVE

## 2017-03-24 LAB — RHEUMATOID FACTOR

## 2017-03-27 ENCOUNTER — Encounter: Payer: Self-pay | Admitting: Family Medicine

## 2017-03-27 LAB — ANGIOTENSIN CONVERTING ENZYME: ANGIOTENSIN-CONVERTING ENZYME: 9 U/L (ref 9–67)

## 2017-03-28 ENCOUNTER — Other Ambulatory Visit: Payer: Self-pay | Admitting: Internal Medicine

## 2017-03-29 ENCOUNTER — Encounter: Payer: Self-pay | Admitting: Internal Medicine

## 2017-03-29 ENCOUNTER — Ambulatory Visit (INDEPENDENT_AMBULATORY_CARE_PROVIDER_SITE_OTHER): Payer: BLUE CROSS/BLUE SHIELD | Admitting: Internal Medicine

## 2017-03-29 VITALS — BP 120/78 | Ht 64.5 in | Wt 227.0 lb

## 2017-03-29 DIAGNOSIS — E119 Type 2 diabetes mellitus without complications: Secondary | ICD-10-CM | POA: Diagnosis not present

## 2017-03-29 DIAGNOSIS — R238 Other skin changes: Secondary | ICD-10-CM | POA: Insufficient documentation

## 2017-03-29 DIAGNOSIS — R202 Paresthesia of skin: Secondary | ICD-10-CM

## 2017-03-29 DIAGNOSIS — R233 Spontaneous ecchymoses: Secondary | ICD-10-CM

## 2017-03-29 NOTE — Progress Notes (Signed)
Subjective:    Patient ID: Suzanne Hansen, female    DOB: [birth year], 61 y.o.   MRN: [id number]  HPI  Here to f/u; has several concerns with easy bruising and left lower back pain with numbness and tingling to the distal LLE worsening in the past 3 wks. Pt continues to have recurring left LBP without change in severity, and no bowel or bladder change, fever, wt loss,  worsening gait change or falls,  Pt denies chest pain, increasing sob or doe, wheezing, orthopnea, PND, increased LE swelling, palpitations, dizziness or syncope.  Pt denies new neurological symptoms such as new headache, or facial or extremity weakness or numbness.  Pt denies polydipsia, polyuria, or low sugar episode.  Pt states overall good compliance with meds mostly except not taking ASA lately. [name]  Has several bruises to legs only she cannot account for, very concerned she may have bleeding problem Past Medical History:  Diagnosis Date  . ALLERGIC RHINITIS 05/09/2008  . Allergy   . ANXIETY 05/23/2007  . ARM PAIN, LEFT 10/23/2009  . Asthma   . ASTHMA 05/23/2007  . Cardiomyopathy ([place]) 09/08/2015  . CHF (congestive heart failure) ([place])   . CONTACT DERMATITIS 06/09/2009  . Diabetes mellitus   . ENDOMETRIOSIS NOS 05/23/2007  . FACIAL PAIN 06/02/2010  . FREQUENCY, URINARY 05/17/2010  . GLUCOSE INTOLERANCE 08/28/2009  . HERPES SIMPLEX, UNCOMPLICATED 5/88/5027  . Hyperlipidemia 08/28/2015  . HYPERTHYROIDISM 05/23/2007  . Hypoactive thyroid   . HYPOTHYROIDISM 05/09/2008  . Impaired glucose tolerance 01/28/2011  . INSOMNIA, HX OF 05/23/2007  . LATERAL EPICONDYLITIS, RIGHT 03/10/2009  . LIPOMA 10/22/2010  . NECK PAIN 11/10/2010  . OBESITY 05/23/2007  . Plantar fasciitis    Both feet  . SHOULDER PAIN, LEFT 05/17/2010  . SINUSITIS- ACUTE-NOS 11/03/2008  . SKIN LESION 11/10/2010  . Sleep apnea   . Unspecified Chest Pain 07/25/2008  . UNSPECIFIED URTICARIA 06/04/2009  . URI 08/28/2009  . UTI 04/29/2008  . VITAMIN D DEFICIENCY 08/28/2009  .  Wheezing 07/12/2010   Past Surgical History:  Procedure Laterality Date  . COLONOSCOPY    . ENDOMETRIAL ABLATION    . LAMINECTOMY  1996  . LAPAROTOMY     exploratory  . POLYPECTOMY    . s/p endometrial ablation  12/06  . TUBAL LIGATION    . UTERINE FIBROID SURGERY    . WISDOM TOOTH EXTRACTION      reports that she quit smoking about 29 years ago. Her smoking use included Cigarettes. She smoked 0.50 packs per day. She has never used smokeless tobacco. She reports that she does not drink alcohol or use drugs. family history includes COPD in her father; Cancer in her maternal grandmother; Diabetes in her cousin and sister; Heart disease in her cousin and mother; Hypertension in her cousin and father; Kidney disease in her sister. Allergies  Allergen Reactions  . Ivp Dye [Iodinated Diagnostic Agents] Shortness Of Breath and Other (See Comments)    Swelling of extremity of the injection site  . Latex Itching    ITCHING AND COLD SORES AROUND MOUTH WHEN LATEX GLOVES USED BY THE DENTIST/HYGENTIST  . Clarithromycin Nausea And Vomiting    REACTION: nausea  vomiting  . Metronidazole Nausea And Vomiting   Current Outpatient Prescriptions on File Prior to Visit  Medication Sig Dispense Refill  . aspirin 81 MG EC tablet Take 81 mg by mouth daily.      . carvedilol (COREG) 3.125 MG tablet Take 1 tablet (3.125 mg total) by  mouth 2 (two) times daily. 180 tablet 3  . Cholecalciferol (VITAMIN D) 2000 UNITS CAPS Take 2 capsules by mouth daily.    . Diclofenac Sodium 2 % SOLN Place 1 application onto the skin 2 (two) times daily. 112 g 3  . fexofenadine (ALLEGRA) 180 MG tablet Take 180 mg by mouth daily.      . furosemide (LASIX) 40 MG tablet TAKE 1 TABLET (40 MG TOTAL) BY MOUTH DAILY. 90 tablet 3  . levothyroxine (SYNTHROID, LEVOTHROID) 112 MCG tablet Take 1 tablet (112 mcg total) by mouth daily. 90 tablet 3  . losartan (COZAAR) 50 MG tablet Take 1 tablet (50 mg total) by mouth daily. 90 tablet 3  .  magnesium oxide (MAG-OX) 400 MG tablet Take 400 mg by mouth daily.    . meloxicam (MOBIC) 15 MG tablet Take 1 tablet (15 mg total) by mouth daily. 90 tablet 2  . Multiple Vitamin (MULTIVITAMIN) capsule Take 1 capsule by mouth daily.      [name] PROAIR HFA 108 (90 Base) MCG/ACT inhaler INHALE 2 PUFFS INTO THE LUNGS EVERY 4 (FOUR) HOURS AS NEEDED FOR WHEEZING OR SHORTNESS OF BREATH. 8.5 Inhaler 1  . rosuvastatin (CRESTOR) 10 MG tablet TAKE 1 TABLET BY MOUTH EVERY DAY 90 tablet 3  . SYMBICORT 160-4.5 MCG/ACT inhaler INHALE 2 PUFFS INTO THE LUNGS 2 (TWO) TIMES DAILY. 10.2 Inhaler 2  . valACYclovir (VALTREX) 1000 MG tablet Take 500 mg by mouth daily.  0   Current Facility-Administered Medications on File Prior to Visit  Medication Dose Route Frequency Provider Last Rate Last Dose  . 0.9 %  sodium chloride infusion  500 mL Intravenous Continuous Nandigam, Kavitha V, MD       Review of Systems  Constitutional: Negative for other unusual diaphoresis or sweats HENT: Negative for ear discharge or swelling Eyes: Negative for other worsening visual disturbances Respiratory: Negative for stridor or other swelling  Gastrointestinal: Negative for worsening distension or other blood Genitourinary: Negative for retention or other urinary change Musculoskeletal: Negative for other MSK pain or swelling Skin: Negative for color change or other new lesions Neurological: Negative for worsening tremors and other numbness  Psychiatric/Behavioral: Negative for worsening agitation or other fatigue All oither system neg per pt    Objective:   Physical Exam BP 120/78   Ht 5' 4.5" (1.638 m)   Wt 227 lb (103 kg)   BMI 38.36 kg/m  VS noted,  Constitutional: Pt appears in NAD HENT: Head: NCAT.  Right Ear: External ear normal.  Left Ear: External ear normal.  Eyes: . Pupils are equal, round, and reactive to light. Conjunctivae and EOM are normal Nose: without d/c or deformity Neck: Neck supple. Gross normal  ROM Cardiovascular: Normal rate and regular rhythm.   Pulmonary/Chest: Effort normal and breath sounds without rales or wheezing.  Abd:  Soft, NT, ND, + BS, no organomegaly Spine nontender Neurological: Pt is alert. At baseline orientation, motor grossly intact, sens intact to LT LE's Skin: Skin is warm. No rashes, has multiple bruising areas to legs below the knees no LE edema Psychiatric: Pt behavior is normal without agitation   Lab Results  Component Value Date   WBC 6.5 03/21/2017   HGB 13.2 03/21/2017   HCT 39.0 03/21/2017   PLT 268.0 03/21/2017   GLUCOSE 104 (H) 03/21/2017   CHOL 124 03/21/2017   TRIG 59.0 03/21/2017   HDL 67.90 03/21/2017   LDLCALC 44 03/21/2017   ALT 28 03/21/2017   AST 19 03/21/2017  NA 143 03/21/2017   K 4.0 03/21/2017   CL 108 03/21/2017   CREATININE 1.07 03/21/2017   BUN 23 03/21/2017   CO2 30 03/21/2017   TSH 0.05 (L) 03/21/2017   INR 1.0 03/21/2017   HGBA1C 5.9 03/21/2017   MICROALBUR 2.2 (H) 03/21/2017        Assessment & Plan:

## 2017-03-29 NOTE — Patient Instructions (Signed)
Please continue all other medications as before, including the aspirin  Please have the pharmacy call with any other refills you may need.  Please keep your appointments with your specialists as you may have planned  You will be contacted regarding the referral for: Nerve test for the legs

## 2017-04-01 NOTE — Assessment & Plan Note (Signed)
D/w pt the recent bruising to leg only may be related to asa but has not been taking recently, INR and plt recently ok but pt is adamant for hematology referral

## 2017-04-01 NOTE — Assessment & Plan Note (Signed)
Etiology unclear, for NCS LE's r/o neuropathy

## 2017-04-01 NOTE — Assessment & Plan Note (Signed)
stable overall by history and exam, recent data reviewed with pt, and pt to continue medical treatment as before,  to f/u any worsening symptoms or concerns Lab Results  Component Value Date   HGBA1C 5.9 03/21/2017

## 2017-04-07 ENCOUNTER — Telehealth: Payer: Self-pay | Admitting: Internal Medicine

## 2017-04-07 NOTE — Telephone Encounter (Signed)
Pt states a referral for a hematologist was supposed to be put in  Please advise

## 2017-04-07 NOTE — Telephone Encounter (Signed)
It was at last visit, to oncology and the oncology and hematology are the same

## 2017-04-07 NOTE — Telephone Encounter (Signed)
Called pt and informed her. She would like to know when they will be calling to set up something for her?

## 2017-04-10 ENCOUNTER — Other Ambulatory Visit: Payer: Self-pay | Admitting: *Deleted

## 2017-04-10 DIAGNOSIS — R202 Paresthesia of skin: Secondary | ICD-10-CM

## 2017-04-11 ENCOUNTER — Ambulatory Visit: Payer: BLUE CROSS/BLUE SHIELD | Admitting: Neurology

## 2017-04-11 DIAGNOSIS — R202 Paresthesia of skin: Secondary | ICD-10-CM

## 2017-04-11 NOTE — Procedures (Signed)
[facility]  [place], [address]  [place], [place] [address] Tel: [phone] Fax:  [phone] Test Date:  04/11/2017  Patient: Suzanne Hansen DOB: 1956/01/17 Physician: Narda Amber, DO  Sex: Female Height: 5\' 4"  Ref Phys: Biagio Borg, M.D.  ID#: [id number]   Technician:    Patient Complaints: This is a 61 year-old female referred for evaluation of left low back pain and left foot numbness.  NCV & EMG Findings: Electrodiagnostic testing was prematurely terminated at patient's request due to intolerance of testing. No nerve conduction studies were performed to achieve supramaximal stimuli and therefore no conclusions can be made.    ___________________________ Narda Amber, DO    Nerve Conduction Studies Anti Sensory Summary Table   Stim Site NR Peak (ms) Norm Peak (ms) P-T Amp (V) Norm P-T Amp  Left Sup Peroneal Anti Sensory (Ant Lat Mall)  32.5C  12 cm    - <4.6 - >3      Waveforms:

## 2017-04-20 DIAGNOSIS — I70212 Atherosclerosis of native arteries of extremities with intermittent claudication, left leg: Secondary | ICD-10-CM | POA: Insufficient documentation

## 2017-04-27 ENCOUNTER — Ambulatory Visit (INDEPENDENT_AMBULATORY_CARE_PROVIDER_SITE_OTHER): Payer: BLUE CROSS/BLUE SHIELD | Admitting: Family Medicine

## 2017-04-27 ENCOUNTER — Encounter: Payer: Self-pay | Admitting: Family Medicine

## 2017-04-27 VITALS — BP 120/80 | Wt 233.0 lb

## 2017-04-27 DIAGNOSIS — M170 Bilateral primary osteoarthritis of knee: Secondary | ICD-10-CM

## 2017-04-27 DIAGNOSIS — M1710 Unilateral primary osteoarthritis, unspecified knee: Secondary | ICD-10-CM | POA: Diagnosis not present

## 2017-04-27 MED ORDER — GABAPENTIN 100 MG PO CAPS: 200.0000 mg | ORAL_CAPSULE | Freq: Every day | ORAL | 3 refills | Status: DC

## 2017-04-27 NOTE — Progress Notes (Signed)
Suzanne Hansen Suzanne Hansen, Suzanne Hansen [address] Phone: [phone] Subjective:    I'm seeing this patient by the request  of:  Suzanne Borg, MD   CC: Knee pain  [id number]  Suzanne Hansen is a 61 y.o. female coming in with complaint of knee pain. Patient was seen 10 weeks ago. Was given an injection in both knees. States that the right knee seems to be doing better with the worse pain seems to be in the left knee. Having some increasing instability. States that the pain is severe. Has seen other providers and recently diagnosed with more of a peripheral arterial disease. Continues to have difficulty with congestive heart failure as well. Patient is having difficulty overall. Finds that the pain in the legs and the knee seems to be affecting daily activities as well as her job. Wondering what all she can possibly do.   patient denied bilateral knee x-rays done 01/25/2016. Independently visualized by me showing moderate osteophytic changes.  Past Medical History:  Diagnosis Date  . ALLERGIC RHINITIS 05/09/2008  . Allergy   . ANXIETY 05/23/2007  . ARM PAIN, LEFT 10/23/2009  . Asthma   . ASTHMA 05/23/2007  . Cardiomyopathy ([place]) 09/08/2015  . CHF (congestive heart failure) ([place])   . CONTACT DERMATITIS 06/09/2009  . Diabetes mellitus   . ENDOMETRIOSIS NOS 05/23/2007  . FACIAL PAIN 06/02/2010  . FREQUENCY, URINARY 05/17/2010  . GLUCOSE INTOLERANCE 08/28/2009  . HERPES SIMPLEX, UNCOMPLICATED 5/85/2778  . Hyperlipidemia 08/28/2015  . HYPERTHYROIDISM 05/23/2007  . Hypoactive thyroid   . HYPOTHYROIDISM 05/09/2008  . Impaired glucose tolerance 01/28/2011  . INSOMNIA, HX OF 05/23/2007  . LATERAL EPICONDYLITIS, RIGHT 03/10/2009  . LIPOMA 10/22/2010  . NECK PAIN 11/10/2010  . OBESITY 05/23/2007  . Plantar fasciitis    Both feet  . SHOULDER PAIN, LEFT 05/17/2010  . SINUSITIS- ACUTE-NOS 11/03/2008  . SKIN LESION 11/10/2010  . Sleep apnea   . Unspecified Chest Pain  07/25/2008  . UNSPECIFIED URTICARIA 06/04/2009  . URI 08/28/2009  . UTI 04/29/2008  . VITAMIN D DEFICIENCY 08/28/2009  . Wheezing 07/12/2010   Past Surgical History:  Procedure Laterality Date  . COLONOSCOPY    . ENDOMETRIAL ABLATION    . LAMINECTOMY  1996  . LAPAROTOMY     exploratory  . POLYPECTOMY    . s/p endometrial ablation  12/06  . TUBAL LIGATION    . UTERINE FIBROID SURGERY    . WISDOM TOOTH EXTRACTION     Social History   Social History  . Marital status: Divorced    Spouse name: N/A  . Number of children: 3  . Years of education: N/A   Occupational History  . 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 Smoking status: Former Smoker    Packs/day: 0.50    Types: Cigarettes    Quit date: 11/05/1987  . Smokeless tobacco: Never Used  . Alcohol use No     Comment: WINE  . Drug use: No  . Sexual activity: Not on file   Other Topics Concern  . Not on file   Social History Narrative  . No narrative on file   Allergies  Allergen Reactions  . Ivp Dye [Iodinated Diagnostic Agents] Shortness Of Breath and Other (See Comments)    Swelling of extremity of the injection site  . Latex Itching    ITCHING AND COLD SORES AROUND MOUTH WHEN LATEX  GLOVES USED BY THE DENTIST/HYGENTIST  . Clarithromycin Nausea And Vomiting    REACTION: nausea  vomiting  . Metronidazole Nausea And Vomiting   Family History  Problem Relation Age of Onset  . Diabetes Cousin   . Hypertension Cousin   . Heart disease Cousin   . Heart disease Mother        Rheumatic heart disease  . COPD Father   . Hypertension Father   . Cancer Maternal Grandmother        Breast  . Diabetes Sister   . Kidney disease Sister     Past medical history, social, surgical and family history all reviewed in electronic medical record.  No pertanent information unless stated regarding to the chief complaint.   Review of Systems: No , visual changes, nausea,  vomiting, diarrhea, constipation, dizziness, abdominal pain, skin rash, fevers, chills, night sweats, weight loss, swollen lymph nodes,chest pain, shortness of breath, mood changes.  Positive muscle aches, body aches, numbness of the legs, headaches  Objective  Blood pressure 120/80, weight 233 lb (105.7 kg), SpO2 (!) 72 %. Systems examined below as of 04/27/17   General: No apparent distress alert and oriented x3 mood and affect normal, dressed appropriately.  HEENT: Pupils equal, extraocular movements intact  Respiratory: Patient's speak in full sentences and does not appear short of breath  Cardiovascular: Trace effusion with 1+ pulses of the lower extremities Skin: Warm dry intact with no signs of infection or rash on extremities or on axial skeleton.  Abdomen: Soft nontender  Neuro: Cranial nerves II through XII are intact, neurovascularly intact in all extremities with 2+ DTRs and 1+ pulses.  Lymph: No lymphadenopathy of posterior or anterior cervical chain or axillae bilaterally.  Gait antalgic gait MSK:  Non tender with full range of motion and good stability and symmetric strength and tone of shoulders, elbows, wrist, hip, and ankles bilaterally.   Knee: Left valgus deformity noted. Large thigh to calf ratio.  Tender to palpation over medial and PF joint line.  ROM full in flexion and extension and lower leg rotation. instability with valgus force.  painful patellar compression. Patellar glide with moderate crepitus. Patellar and quadriceps tendons unremarkable. Hamstring and quadriceps strength is normal. Contralateral knee shows   After informed written and verbal consent, patient was seated on exam table. Left knee was prepped with alcohol swab and utilizing anterolateral approach, patient's left knee space was injected with 16 mg/2.5 mL of Synvisc (sodium hyaluronate) in a prefilled syringe was injected easily into the knee through a 22-gauge needle.. Patient tolerated the  procedure well without immediate complications.   Impression and Recommendations:     This case required medical decision making of moderate complexity.      Note: This dictation was prepared with Dragon dictation along with smaller phrase technology. Any transcriptional errors that result from this process are unintentional.

## 2017-04-27 NOTE — Assessment & Plan Note (Addendum)
Worsening symptoms. Given injection of Synvisc in the left knee today. Tolerated the procedure well. We discussed icing regimen and home exercises. Patient will be fitted for custom brace that is medical necessary secondary to patient's body habitus and large thigh to calf ratio. Patient will also be sent to formal physical therapy to do the home exercises on a more regular basis. Topical anti-inflammatories given. Gabapentin given in case there is some radicular symptoms from the back. Patient states that she is being worked up for this in an outside facility. Patient is also being worked up for peripheral artery disease. Follow-up again in 1 week for second in a series of 3 injections.

## 2017-04-27 NOTE — Patient Instructions (Signed)
Good to see you  Gave you handicap information today  Gabapentin 200mg  at night Increase your amlodipine to 10 mg daily  I hope you will get some relief from the injections.  Physical therapy will be calling you  See me again next week for 2nd in a series of 3 injections.

## 2017-04-28 ENCOUNTER — Telehealth: Payer: Self-pay | Admitting: Hematology and Oncology

## 2017-04-28 ENCOUNTER — Encounter: Payer: Self-pay | Admitting: Hematology and Oncology

## 2017-04-28 NOTE — Telephone Encounter (Signed)
Appt has been scheduled for the pt to see Perlov on 7/25 at 3pm. Pt aware to arrive 15 minutes early. Insurance and address verified. Letter mailed.

## 2017-05-01 ENCOUNTER — Ambulatory Visit: Payer: BLUE CROSS/BLUE SHIELD | Admitting: Podiatry

## 2017-05-01 ENCOUNTER — Telehealth: Payer: Self-pay | Admitting: Hematology and Oncology

## 2017-05-01 NOTE — Telephone Encounter (Signed)
Pt cld to cancel appt with Dr. Lebron Conners on 7/25

## 2017-05-02 ENCOUNTER — Encounter: Payer: Self-pay | Admitting: Physical Therapy

## 2017-05-02 ENCOUNTER — Ambulatory Visit: Payer: BLUE CROSS/BLUE SHIELD | Attending: Family Medicine | Admitting: Physical Therapy

## 2017-05-02 ENCOUNTER — Ambulatory Visit (INDEPENDENT_AMBULATORY_CARE_PROVIDER_SITE_OTHER): Payer: BLUE CROSS/BLUE SHIELD | Admitting: Family Medicine

## 2017-05-02 DIAGNOSIS — M25562 Pain in left knee: Secondary | ICD-10-CM | POA: Diagnosis present

## 2017-05-02 DIAGNOSIS — M170 Bilateral primary osteoarthritis of knee: Secondary | ICD-10-CM | POA: Diagnosis not present

## 2017-05-02 DIAGNOSIS — R262 Difficulty in walking, not elsewhere classified: Secondary | ICD-10-CM | POA: Insufficient documentation

## 2017-05-02 DIAGNOSIS — M5442 Lumbago with sciatica, left side: Secondary | ICD-10-CM | POA: Insufficient documentation

## 2017-05-02 DIAGNOSIS — M62830 Muscle spasm of back: Secondary | ICD-10-CM | POA: Insufficient documentation

## 2017-05-02 NOTE — Assessment & Plan Note (Signed)
Second in a series of viscous supplementation given today. One more in 1 week. Continue conservative therapy.

## 2017-05-02 NOTE — Progress Notes (Signed)
[name] [address] [place], [place] [address] Phone: [phone] Subjective:    I'm seeing this patient by the request  of:  Biagio Borg, MD   CC: Knee pain, Left  SEG:[id number]  Suzanne Hansen is a 61 y.o. female coming in with complaint of knee pain. This is more on the left side. Patient has known osteophytic changes of the knee. Patient has failed all conservative therapy. Patient is here for second in a series of injections for the viscous supplementation. Some mild improvement with the first one. No new symptoms   patient denied bilateral knee x-rays done 01/25/2016. Independently visualized by me showing moderate osteophytic changes.  Past Medical History:  Diagnosis Date  . ALLERGIC RHINITIS 05/09/2008  . Allergy   . ANXIETY 05/23/2007  . ARM PAIN, LEFT 10/23/2009  . Asthma   . ASTHMA 05/23/2007  . Cardiomyopathy ([place]) 09/08/2015  . CHF (congestive heart failure) ([place])   . CONTACT DERMATITIS 06/09/2009  . Diabetes mellitus   . ENDOMETRIOSIS NOS 05/23/2007  . FACIAL PAIN 06/02/2010  . FREQUENCY, URINARY 05/17/2010  . GLUCOSE INTOLERANCE 08/28/2009  . HERPES SIMPLEX, UNCOMPLICATED 3/71/0626  . Hyperlipidemia 08/28/2015  . HYPERTHYROIDISM 05/23/2007  . Hypoactive thyroid   . HYPOTHYROIDISM 05/09/2008  . Impaired glucose tolerance 01/28/2011  . INSOMNIA, HX OF 05/23/2007  . LATERAL EPICONDYLITIS, RIGHT 03/10/2009  . LIPOMA 10/22/2010  . NECK PAIN 11/10/2010  . OBESITY 05/23/2007  . Plantar fasciitis    Both feet  . SHOULDER PAIN, LEFT 05/17/2010  . SINUSITIS- ACUTE-NOS 11/03/2008  . SKIN LESION 11/10/2010  . Sleep apnea   . Unspecified Chest Pain 07/25/2008  . UNSPECIFIED URTICARIA 06/04/2009  . URI 08/28/2009  . UTI 04/29/2008  . VITAMIN D DEFICIENCY 08/28/2009  . Wheezing 07/12/2010   Past Surgical History:  Procedure Laterality Date  . COLONOSCOPY    . ENDOMETRIAL ABLATION    . LAMINECTOMY  1996  . LAPAROTOMY     exploratory  .  POLYPECTOMY    . s/p endometrial ablation  12/06  . TUBAL LIGATION    . UTERINE FIBROID SURGERY    . WISDOM TOOTH EXTRACTION     Social History   Social History  . Marital status: Divorced    Spouse name: N/A  . Number of children: 3  . Years of education: N/A   Occupational History  . 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 Smoking status: Former Smoker    Packs/day: 0.50    Types: Cigarettes    Quit date: 11/05/1987  . Smokeless tobacco: Never Used  . Alcohol use No     Comment: WINE  . Drug use: No  . Sexual activity: Not on file   Other Topics Concern  . Not on file   Social History Narrative  . No narrative on file   Allergies  Allergen Reactions  . Ivp Dye [Iodinated Diagnostic Agents] Shortness Of Breath and Other (See Comments)    Swelling of extremity of the injection site  . Latex Itching    ITCHING AND COLD SORES AROUND MOUTH WHEN LATEX GLOVES USED BY THE DENTIST/HYGENTIST  . Clarithromycin Nausea And Vomiting    REACTION: nausea  vomiting  . Metronidazole Nausea And Vomiting   Family History  Problem Relation Age of Onset  . Diabetes Cousin   . Hypertension Cousin   . Heart disease Cousin   . Heart disease Mother  Rheumatic heart disease  . COPD Father   . Hypertension Father   . Cancer Maternal Grandmother        Breast  . Diabetes Sister   . Kidney disease Sister     Past medical history, social, surgical and family history all reviewed in electronic medical record.  No pertanent information unless stated regarding to the chief complaint.   Review of Systems: No headache, visual changes, nausea, vomiting, diarrhea, constipation, dizziness, abdominal pain, skin rash, fevers, chills, night sweats, weight loss, swollen lymph nodes, body aches, joint swelling,  chest pain, shortness of breath, mood changes.  Positive muscle aches  Objective  There were no vitals taken for this  visit. Systems examined below as of 05/02/17   General: No apparent distress alert and oriented x3 mood and affect normal, dressed appropriately.  HEENT: Pupils equal, extraocular movements intact  Respiratory: Patient's speak in full sentences and does not appear short of breath  Cardiovascular: Trace effusion with 1+ pulses of the lower extremities Skin: Warm dry intact with no signs of infection or rash on extremities or on axial skeleton.  Abdomen: Soft nontender  Neuro: Cranial nerves II through XII are intact, neurovascularly intact in all extremities with 2+ DTRs and 1+ pulses.  Lymph: No lymphadenopathy of posterior or anterior cervical chain or axillae bilaterally.  Gait antalgic gait MSK:  Non tender with full range of motion and good stability and symmetric strength and tone of shoulders, elbows, wrist, hip, and ankles bilaterally.   Knee: Left valgus deformity noted. Large thigh to calf ratio.  Tender to palpation over medial and PF joint line.  ROM full in flexion and extension and lower leg rotation. instability with valgus force.  painful patellar compression. Patellar glide with moderate crepitus. Patellar and quadriceps tendons unremarkable. Hamstring and quadriceps strength is normal. No changes from previous exam  After informed written and verbal consent, patient was seated on exam table. Left knee was prepped with alcohol swab and utilizing anterolateral approach, patient's left knee space was injected with 16 mg/2.5 mL of Synvisc (sodium hyaluronate) in a prefilled syringe was injected easily into the knee through a 22-gauge needle.. Patient tolerated the procedure well without immediate complications.       Impression and Recommendations:     This case required medical decision making of moderate complexity.      Note: This dictation was prepared with Dragon dictation along with smaller phrase technology. Any transcriptional errors that result from this  process are unintentional.

## 2017-05-02 NOTE — Patient Instructions (Signed)
Good to see you  2 down and one to go  See you again in 10 days or so

## 2017-05-02 NOTE — Therapy (Signed)
[place], [place], [address] Phone: [phone]   Fax:  [phone]  Physical Therapy Evaluation  Patient Details  Name: Suzanne Hansen MRN: [id number] Date of Birth: 09-Aug-1956 Referring Provider: Z.Smith  Encounter Date: 05/02/2017      PT End of Session - 05/02/17 0939    Visit Number 1   Date for PT Re-Evaluation 07/03/17   PT Start Time 0853   PT Stop Time 0955   PT Time Calculation (min) 62 min   Activity Tolerance Patient tolerated treatment well   Behavior During Therapy [facility] for tasks assessed/performed      Past Medical History:  Diagnosis Date  . ALLERGIC RHINITIS 05/09/2008  . Allergy   . ANXIETY 05/23/2007  . ARM PAIN, LEFT 10/23/2009  . Asthma   . ASTHMA 05/23/2007  . Cardiomyopathy ([place]) 09/08/2015  . CHF (congestive heart failure) ([place])   . CONTACT DERMATITIS 06/09/2009  . Diabetes mellitus   . ENDOMETRIOSIS NOS 05/23/2007  . FACIAL PAIN 06/02/2010  . FREQUENCY, URINARY 05/17/2010  . GLUCOSE INTOLERANCE 08/28/2009  . HERPES SIMPLEX, UNCOMPLICATED 3/89/3734  . Hyperlipidemia 08/28/2015  . HYPERTHYROIDISM 05/23/2007  . Hypoactive thyroid   . HYPOTHYROIDISM 05/09/2008  . Impaired glucose tolerance 01/28/2011  . INSOMNIA, HX OF 05/23/2007  . LATERAL EPICONDYLITIS, RIGHT 03/10/2009  . LIPOMA 10/22/2010  . NECK PAIN 11/10/2010  . OBESITY 05/23/2007  . Plantar fasciitis    Both feet  . SHOULDER PAIN, LEFT 05/17/2010  . SINUSITIS- ACUTE-NOS 11/03/2008  . SKIN LESION 11/10/2010  . Sleep apnea   . Unspecified Chest Pain 07/25/2008  . UNSPECIFIED URTICARIA 06/04/2009  . URI 08/28/2009  . UTI 04/29/2008  . VITAMIN D DEFICIENCY 08/28/2009  . Wheezing 07/12/2010    Past Surgical History:  Procedure Laterality Date  . COLONOSCOPY    . ENDOMETRIAL ABLATION    . LAMINECTOMY  1996  . LAPAROTOMY     exploratory  . POLYPECTOMY    . s/p endometrial ablation  12/06  . TUBAL LIGATION    .  UTERINE FIBROID SURGERY    . WISDOM TOOTH EXTRACTION      There were no vitals filed for this visit.       Subjective Assessment - 05/02/17 0859    Subjective Patient reports that she has had bilateral knee pain and back pain for about a month.  She reports that she has had some injections, with some help.  She reports that she really needs to get better due to her starting a weight loss program that is paid for through a grant.  The left knee is the one feeling the worst, she reports PAD on the left side.  She does report some mid to low back pain with left buttock pain.   Pertinent History CHF, PAD   Limitations Lifting;Standing;Walking;House hold activities   Patient Stated Goals be ready to go to the weight loss program, lift grandchild, walk with her   Currently in Pain? Yes   Pain Score 7    Pain Location Knee  low back   Pain Orientation Left   Pain Descriptors / Indicators Aching   Pain Type Acute pain   Pain Onset More than a month ago   Pain Frequency Constant   Aggravating Factors  worse in the AM, standing and walking, pain will be up 9/10   Pain Relieving Factors rest, elevate the left leg pain at best a 3-4/10   Effect of  Pain on Daily Activities difficulty with all ADL's            [facility] PT Assessment - 05/02/17 0001      Assessment   Medical Diagnosis left knee pain and back pain   Referring Provider Z.Smith   Onset Date/Surgical Date 04/02/17   Prior Therapy no     Precautions   Precautions None     Balance Screen   Has the patient fallen in the past 6 months No   Has the patient had a decrease in activity level because of a fear of falling?  No   Is the patient reluctant to leave their home because of a fear of falling?  No     Home Environment   Additional Comments has stairs at home, does her own housework     Prior Function   Level of Independence Independent   Vocation Full time employment   Vocation Requirements travel, stand and walk,  behavior specialist   Leisure no exercise     ROM / Strength   AROM / PROM / Strength AROM;Strength     AROM   Overall AROM Comments Lumbar ROM decreased 50% with pain   AROM Assessment Site Knee   Right/Left Knee Left   Left Knee Extension 0   Left Knee Flexion 97     Strength   Overall Strength Comments hips right 4+/5, left 4-/5, knees 4+/5, left 4-/5 with pain     Flexibility   Soft Tissue Assessment /Muscle Length --  very tight HS, tight piriformis     Palpation   Palpation comment crepitus in the left knee, left lateral patellar tracking, she is very tender in the low back     Ambulation/Gait   Gait Comments significant antalgic gait on the left, no device            Objective measurements completed on examination: See above findings.          [place] Adult PT Treatment/Exercise - 05/02/17 0001      Exercises   Exercises Knee/Hip     Knee/Hip Exercises: Aerobic   Nustep level 4 x 5 minutes   Other Aerobic UBE level 5 x 4 minutes     Modalities   Modalities Electrical Stimulation;Moist Heat     Moist Heat Therapy   Number Minutes Moist Heat 15 Minutes   Moist Heat Location Lumbar Spine;Cervical     Electrical Stimulation   Electrical Stimulation Location lumbar area   Electrical Stimulation Action IFC   Electrical Stimulation Parameters supine   Electrical Stimulation Goals Pain                  PT Short Term Goals - 05/02/17 0945      PT SHORT TERM GOAL #1   Title independent with initial HEP   Time 2   Period Weeks   Status New           PT Long Term Goals - 05/02/17 0945      PT LONG TERM GOAL #1   Title decrease back pain 50%   Time 8   Period Weeks   Status New   Target Date 07/03/17     PT LONG TERM GOAL #2   Title decrease left knee pain 50%   Time 8   Period Weeks   Status New     PT LONG TERM GOAL #3   Title walk .5 miles without difficulty   Time 8  Period Weeks   Status New     PT LONG TERM  GOAL #4   Title increase AROM of the lumbar spine 25%   Time 8   Period Weeks   Status New                Plan - 05/02/17 0941    Clinical Impression Statement Patient reports that she has had some issues over the past several year she reports due to CHF and PAD, she reports that the back and the knee pain has been in the last month, reports that it really has limited her ability to walk and do activities.  Her lumbar ROM was decreased 50%, she has very tight HS, she had spasms and tenderness in the low back, AROM of the left knee was 0-95 degrees flexion.  She has a significant limp on the left, valgus on the left.   History and Personal Factors relevant to plan of care: past back surgery for ruptured disc 1996?, PAD, CHF   Clinical Presentation Evolving   Clinical Presentation due to: back and knee pain   Clinical Decision Making Moderate   Rehab Potential Good   PT Frequency 2x / week   PT Duration 8 weeks   PT Treatment/Interventions ADLs/Self Care Home Management;Cryotherapy;Electrical Stimulation;Iontophoresis 4mg /ml Dexamethasone;Moist Heat;Traction;Ultrasound;Therapeutic activities;Gait training;Stair training;Functional mobility training;Patient/family education;Neuromuscular re-education;Balance training;Therapeutic exercise;Manual techniques;Vasopneumatic Device   PT Next Visit Plan slowly add gym and strengthening activities   Consulted and Agree with Plan of Care Patient      Patient will benefit from skilled therapeutic intervention in order to improve the following deficits and impairments:  Abnormal gait, Cardiopulmonary status limiting activity, Decreased activity tolerance, Decreased balance, Decreased mobility, Decreased strength, Increased edema, Improper body mechanics, Impaired flexibility, Pain, Increased muscle spasms, Difficulty walking, Decreased range of motion  Visit Diagnosis: Acute bilateral low back pain with left-sided sciatica - Plan: PT plan of care  cert/re-cert  Acute pain of left knee - Plan: PT plan of care cert/re-cert  Difficulty in walking, not elsewhere classified - Plan: PT plan of care cert/re-cert  Muscle spasm of back - Plan: PT plan of care cert/re-cert     Problem List Patient Active Problem List   Diagnosis Date Noted  . Easy bruising 03/29/2017  . Left leg paresthesias 03/29/2017  . Muscle cramping 03/20/2017  . 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 Pain in lower jaw 11/10/2016  . Cough 11/06/2016  . Vaginitis 09/17/2016  . Left wrist pain 09/16/2016  . Hypersomnia with sleep apnea 02/15/2016  . 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 Extrinsic asthma 02/15/2016  . Ankle edema 02/15/2016  . Degenerative arthritis of knee, bilateral 01/21/2016  . Asthma with exacerbation 10/20/2015  . Acute sinus infection 09/29/2015  . Cardiomyopathy ([place]) 09/08/2015  . Bilateral calf pain 08/31/2015  . Hyperlipidemia 08/28/2015  . Left knee pain 08/28/2015  . Varicose veins with pain 08/28/2015  . Peripheral edema 08/13/2015  . Injection site reaction 06/02/2015  . PHN (postherpetic neuralgia) 05/29/2015  . Unspecified asthma, with exacerbation 07/01/2014  . Shingles 06/10/2014  . Skin nodule 11/27/2012  . Diabetes ([place]) 01/28/2011  . Encounter for preventative adult health care exam with abnormal findings 01/28/2011  . LIPOMA 10/22/2010  . VITAMIN D DEFICIENCY 08/28/2009  . Hypothyroidism 05/09/2008  . Allergic rhinitis 05/09/2008  . HERPES ZOSTER W/NERVOUS COMPLICATION NEC 15/17/6160  . HYPERTHYROIDISM 05/23/2007  . OBESITY 05/23/2007  . Anxiety state 05/23/2007  . Asthma 05/23/2007  . ENDOMETRIOSIS NOS 05/23/2007  .  INSOMNIA, HX OF 05/23/2007    Sumner Boast., PT 05/02/2017, 9:48 AM  [place] [address] [place], [place], [address] Phone: [phone]   Fax:  [phone]  Name: Suzanne Hansen MRN: [id number] Date of  Birth: 10/07/1956

## 2017-05-03 ENCOUNTER — Ambulatory Visit: Payer: BLUE CROSS/BLUE SHIELD | Admitting: Physical Therapy

## 2017-05-03 ENCOUNTER — Encounter: Payer: BLUE CROSS/BLUE SHIELD | Admitting: Hematology and Oncology

## 2017-05-04 ENCOUNTER — Ambulatory Visit: Payer: BLUE CROSS/BLUE SHIELD | Admitting: Family Medicine

## 2017-05-05 ENCOUNTER — Encounter: Payer: Self-pay | Admitting: Physical Therapy

## 2017-05-05 ENCOUNTER — Ambulatory Visit: Payer: BLUE CROSS/BLUE SHIELD | Admitting: Physical Therapy

## 2017-05-05 DIAGNOSIS — M5442 Lumbago with sciatica, left side: Secondary | ICD-10-CM

## 2017-05-05 DIAGNOSIS — M62830 Muscle spasm of back: Secondary | ICD-10-CM

## 2017-05-05 DIAGNOSIS — M25562 Pain in left knee: Secondary | ICD-10-CM

## 2017-05-05 DIAGNOSIS — R262 Difficulty in walking, not elsewhere classified: Secondary | ICD-10-CM

## 2017-05-05 NOTE — Therapy (Signed)
[place], [place], [address] Phone: [phone]   Fax:  [phone]  Physical Therapy Treatment  Patient Details  Name: Suzanne Hansen MRN: [id number] Date of Birth: Dec 30, 1955 Referring Provider: Z.Smith  Encounter Date: 05/05/2017      PT End of Session - 05/05/17 0913    Visit Number 2   Date for PT Re-Evaluation 07/03/17   PT Start Time 0849   PT Stop Time 0945   PT Time Calculation (min) 56 min      Past Medical History:  Diagnosis Date  . ALLERGIC RHINITIS 05/09/2008  . Allergy   . ANXIETY 05/23/2007  . ARM PAIN, LEFT 10/23/2009  . Asthma   . ASTHMA 05/23/2007  . Cardiomyopathy ([place]) 09/08/2015  . CHF (congestive heart failure) ([place])   . CONTACT DERMATITIS 06/09/2009  . Diabetes mellitus   . ENDOMETRIOSIS NOS 05/23/2007  . FACIAL PAIN 06/02/2010  . FREQUENCY, URINARY 05/17/2010  . GLUCOSE INTOLERANCE 08/28/2009  . HERPES SIMPLEX, UNCOMPLICATED 9/47/0962  . Hyperlipidemia 08/28/2015  . HYPERTHYROIDISM 05/23/2007  . Hypoactive thyroid   . HYPOTHYROIDISM 05/09/2008  . Impaired glucose tolerance 01/28/2011  . INSOMNIA, HX OF 05/23/2007  . LATERAL EPICONDYLITIS, RIGHT 03/10/2009  . LIPOMA 10/22/2010  . NECK PAIN 11/10/2010  . OBESITY 05/23/2007  . Plantar fasciitis    Both feet  . SHOULDER PAIN, LEFT 05/17/2010  . SINUSITIS- ACUTE-NOS 11/03/2008  . SKIN LESION 11/10/2010  . Sleep apnea   . Unspecified Chest Pain 07/25/2008  . UNSPECIFIED URTICARIA 06/04/2009  . URI 08/28/2009  . UTI 04/29/2008  . VITAMIN D DEFICIENCY 08/28/2009  . Wheezing 07/12/2010    Past Surgical History:  Procedure Laterality Date  . COLONOSCOPY    . ENDOMETRIAL ABLATION    . LAMINECTOMY  1996  . LAPAROTOMY     exploratory  . POLYPECTOMY    . s/p endometrial ablation  12/06  . TUBAL LIGATION    . UTERINE FIBROID SURGERY    . WISDOM TOOTH EXTRACTION      There were no vitals filed for this visit.       Subjective Assessment - 05/05/17 0847    Subjective doing okay- neck,back,knees and foot pain ( started gabipentin and celebrex yesterday for foot)   Currently in Pain? Yes   Pain Score 6                          [facility] Adult PT Treatment/Exercise - 05/05/17 0001      Knee/Hip Exercises: Aerobic   Recumbent Bike 5 min   Other Aerobic UBE level 5 x 4 minutes     Knee/Hip Exercises: Machines for Strengthening   Cybex Knee Extension 5# 2 sets 10   Other Machine lat pull 20# 2 sets 15  black tband 2 sets 10     Knee/Hip Exercises: Standing   Other Standing Knee Exercises red tband scap stab 3 way 15 each     Knee/Hip Exercises: Seated   Ball Squeeze 20   Clamshell with TheraBand Red   Other Seated Knee/Hip Exercises sit fit pelvic ROM 15 times each     Moist Heat Therapy   Number Minutes Moist Heat 15 Minutes   Moist Heat Location Lumbar Spine;Cervical     Electrical Stimulation   Electrical Stimulation Location lumbar area   Electrical Stimulation Action IFC   Electrical Stimulation Parameters supine   Electrical Stimulation Goals Pain  PT Short Term Goals - 05/02/17 0945      PT SHORT TERM GOAL #1   Title independent with initial HEP   Time 2   Period Weeks   Status New           PT Long Term Goals - 05/02/17 0945      PT LONG TERM GOAL #1   Title decrease back pain 50%   Time 8   Period Weeks   Status New   Target Date 07/03/17     PT LONG TERM GOAL #2   Title decrease left knee pain 50%   Time 8   Period Weeks   Status New     PT LONG TERM GOAL #3   Title walk .5 miles without difficulty   Time 8   Period Weeks   Status New     PT LONG TERM GOAL #4   Title increase AROM of the lumbar spine 25%   Time 8   Period Weeks   Status New               Plan - 05/05/17 1017    Clinical Impression Statement pt tolerated initiation of ex very well. cuing needed for core activation and control of  mvmt   PT Next Visit Plan assess response to todays session and progress as tolerated. ISSUE HEP      Patient will benefit from skilled therapeutic intervention in order to improve the following deficits and impairments:  Abnormal gait, Cardiopulmonary status limiting activity, Decreased activity tolerance, Decreased balance, Decreased mobility, Decreased strength, Increased edema, Improper body mechanics, Impaired flexibility, Pain, Increased muscle spasms, Difficulty walking, Decreased range of motion  Visit Diagnosis: Acute bilateral low back pain with left-sided sciatica  Acute pain of left knee  Difficulty in walking, not elsewhere classified  Muscle spasm of back     Problem List Patient Active Problem List   Diagnosis Date Noted  . Easy bruising 03/29/2017  . Left leg paresthesias 03/29/2017  . Muscle cramping 03/20/2017  . 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 Pain in lower jaw 11/10/2016  . Cough 11/06/2016  . Vaginitis 09/17/2016  . Left wrist pain 09/16/2016  . Hypersomnia with sleep apnea 02/15/2016  . 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 Extrinsic asthma 02/15/2016  . Ankle edema 02/15/2016  . Degenerative arthritis of knee, bilateral 01/21/2016  . Asthma with exacerbation 10/20/2015  . Acute sinus infection 09/29/2015  . Cardiomyopathy ([place]) 09/08/2015  . Bilateral calf pain 08/31/2015  . Hyperlipidemia 08/28/2015  . Left knee pain 08/28/2015  . Varicose veins with pain 08/28/2015  . Peripheral edema 08/13/2015  . Injection site reaction 06/02/2015  . PHN (postherpetic neuralgia) 05/29/2015  . Unspecified asthma, with exacerbation 07/01/2014  . Shingles 06/10/2014  . Skin nodule 11/27/2012  . Diabetes ([place]) 01/28/2011  . Encounter for preventative adult health care exam with abnormal findings 01/28/2011  . LIPOMA 10/22/2010  . VITAMIN D DEFICIENCY 08/28/2009  . Hypothyroidism 05/09/2008  . Allergic rhinitis 05/09/2008  . HERPES ZOSTER  W/NERVOUS COMPLICATION NEC 51/11/5850  . HYPERTHYROIDISM 05/23/2007  . OBESITY 05/23/2007  . Anxiety state 05/23/2007  . Asthma 05/23/2007  . ENDOMETRIOSIS NOS 05/23/2007  . INSOMNIA, HX OF 05/23/2007    PAYSEUR,ANGIE PTA 05/05/2017, 9:16 AM  [place] [address] [place], [place], [address] Phone: [phone]   Fax:  [phone]  Name: Suzanne Hansen MRN: [id number] Date of Birth: [birth year]

## 2017-05-08 ENCOUNTER — Encounter: Payer: Self-pay | Admitting: Physical Therapy

## 2017-05-08 ENCOUNTER — Ambulatory Visit: Payer: BLUE CROSS/BLUE SHIELD | Admitting: Physical Therapy

## 2017-05-08 DIAGNOSIS — M62830 Muscle spasm of back: Secondary | ICD-10-CM

## 2017-05-08 DIAGNOSIS — R262 Difficulty in walking, not elsewhere classified: Secondary | ICD-10-CM

## 2017-05-08 DIAGNOSIS — M5442 Lumbago with sciatica, left side: Secondary | ICD-10-CM

## 2017-05-08 DIAGNOSIS — M25562 Pain in left knee: Secondary | ICD-10-CM

## 2017-05-08 NOTE — Therapy (Signed)
[place], [place], [address] Phone: [phone]   Fax:  [phone]  Physical Therapy Treatment  Patient Details  Name: Suzanne Hansen MRN: [id number] Date of Birth: 28-Feb-1956 Referring Provider: Z.Smith  Encounter Date: 05/08/2017      PT End of Session - 05/08/17 1147    Visit Number 3   Date for PT Re-Evaluation 07/03/17   PT Start Time 1100   PT Stop Time 1201   PT Time Calculation (min) 61 min   Activity Tolerance Patient tolerated treatment well   Behavior During Therapy [facility] for tasks assessed/performed      Past Medical History:  Diagnosis Date  . ALLERGIC RHINITIS 05/09/2008  . Allergy   . ANXIETY 05/23/2007  . ARM PAIN, LEFT 10/23/2009  . Asthma   . ASTHMA 05/23/2007  . Cardiomyopathy ([place]) 09/08/2015  . CHF (congestive heart failure) ([place])   . CONTACT DERMATITIS 06/09/2009  . Diabetes mellitus   . ENDOMETRIOSIS NOS 05/23/2007  . FACIAL PAIN 06/02/2010  . FREQUENCY, URINARY 05/17/2010  . GLUCOSE INTOLERANCE 08/28/2009  . HERPES SIMPLEX, UNCOMPLICATED 8/92/1194  . Hyperlipidemia 08/28/2015  . HYPERTHYROIDISM 05/23/2007  . Hypoactive thyroid   . HYPOTHYROIDISM 05/09/2008  . Impaired glucose tolerance 01/28/2011  . INSOMNIA, HX OF 05/23/2007  . LATERAL EPICONDYLITIS, RIGHT 03/10/2009  . LIPOMA 10/22/2010  . NECK PAIN 11/10/2010  . OBESITY 05/23/2007  . Plantar fasciitis    Both feet  . SHOULDER PAIN, LEFT 05/17/2010  . SINUSITIS- ACUTE-NOS 11/03/2008  . SKIN LESION 11/10/2010  . Sleep apnea   . Unspecified Chest Pain 07/25/2008  . UNSPECIFIED URTICARIA 06/04/2009  . URI 08/28/2009  . UTI 04/29/2008  . VITAMIN D DEFICIENCY 08/28/2009  . Wheezing 07/12/2010    Past Surgical History:  Procedure Laterality Date  . COLONOSCOPY    . ENDOMETRIAL ABLATION    . LAMINECTOMY  1996  . LAPAROTOMY     exploratory  . POLYPECTOMY    . s/p endometrial ablation  12/06  . TUBAL LIGATION    .  UTERINE FIBROID SURGERY    . WISDOM TOOTH EXTRACTION      There were no vitals filed for this visit.      Subjective Assessment - 05/08/17 1105    Subjective Pt reports that she thinks the it is the medicine, pt reports starting medicine June 14 th and she started having numbness the week of June 19 th   Currently in Pain? Yes   Pain Score 6    Pain Location Knee  calf and foot   Pain Orientation Left                         [facility] Adult PT Treatment/Exercise - 05/08/17 0001      Knee/Hip Exercises: Aerobic   Recumbent Bike L1 4 min   Other Aerobic UBE level 5 x 6 minutes     Knee/Hip Exercises: Machines for Strengthening   Cybex Knee Extension 5# 2 sets 10   Cybex Knee Flexion 25lb 2x10    Other Machine Rows & lat pull 25# 2 sets 15     Knee/Hip Exercises: Standing   Other Standing Knee Exercises red tband scap stab 3 way 15 each     Knee/Hip Exercises: Seated   Ball Squeeze 2x10 3 sec hold   Other Seated Knee/Hip Exercises sit fit pelvic ROM 15 times each     Knee/Hip Exercises: Supine  Other Supine Knee/Hip Exercises seated iso core holds physo ball 2x10      Moist Heat Therapy   Number Minutes Moist Heat 15 Minutes   Moist Heat Location Lumbar Spine;Cervical     Electrical Stimulation   Electrical Stimulation Location lumbar area   Electrical Stimulation Action IFC   Electrical Stimulation Parameters supine   Electrical Stimulation Goals Pain                  PT Short Term Goals - 05/02/17 0945      PT SHORT TERM GOAL #1   Title independent with initial HEP   Time 2   Period Weeks   Status New           PT Long Term Goals - 05/02/17 0945      PT LONG TERM GOAL #1   Title decrease back pain 50%   Time 8   Period Weeks   Status New   Target Date 07/03/17     PT LONG TERM GOAL #2   Title decrease left knee pain 50%   Time 8   Period Weeks   Status New     PT LONG TERM GOAL #3   Title walk .5 miles without  difficulty   Time 8   Period Weeks   Status New     PT LONG TERM GOAL #4   Title increase AROM of the lumbar spine 25%   Time 8   Period Weeks   Status New               Plan - 05/08/17 1148    Clinical Impression Statement Pt reports no issues with today's activities. Does reports that she could feel it with pelvic rotations.    Rehab Potential Good   PT Frequency 2x / week   PT Duration 8 weeks   PT Treatment/Interventions ADLs/Self Care Home Management;Cryotherapy;Electrical Stimulation;Iontophoresis 4mg /ml Dexamethasone;Moist Heat;Traction;Ultrasound;Therapeutic activities;Gait training;Stair training;Functional mobility training;Patient/family education;Neuromuscular re-education;Balance training;Therapeutic exercise;Manual techniques;Vasopneumatic Device   PT Next Visit Plan assess response to todays session and progress as tolerated. ISSUE HEP      Patient will benefit from skilled therapeutic intervention in order to improve the following deficits and impairments:  Abnormal gait, Cardiopulmonary status limiting activity, Decreased activity tolerance, Decreased balance, Decreased mobility, Decreased strength, Increased edema, Improper body mechanics, Impaired flexibility, Pain, Increased muscle spasms, Difficulty walking, Decreased range of motion  Visit Diagnosis: Acute bilateral low back pain with left-sided sciatica  Acute pain of left knee  Difficulty in walking, not elsewhere classified  Muscle spasm of back     Problem List Patient Active Problem List   Diagnosis Date Noted  . Easy bruising 03/29/2017  . Left leg paresthesias 03/29/2017  . Muscle cramping 03/20/2017  . 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 Pain in lower jaw 11/10/2016  . Cough 11/06/2016  . Vaginitis 09/17/2016  . Left wrist pain 09/16/2016  . Hypersomnia with sleep apnea 02/15/2016  . 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 Extrinsic asthma 02/15/2016  . Ankle edema  02/15/2016  . Degenerative arthritis of knee, bilateral 01/21/2016  . Asthma with exacerbation 10/20/2015  . Acute sinus infection 09/29/2015  . Cardiomyopathy ([place]) 09/08/2015  . Bilateral calf pain 08/31/2015  . Hyperlipidemia 08/28/2015  . Left knee pain 08/28/2015  . Varicose veins with pain 08/28/2015  . Peripheral edema 08/13/2015  . Injection site reaction 06/02/2015  . PHN (postherpetic neuralgia) 05/29/2015  . Unspecified asthma, with exacerbation 07/01/2014  . Shingles 06/10/2014  .  Skin nodule 11/27/2012  . Diabetes ([place]) 01/28/2011  . Encounter for preventative adult health care exam with abnormal findings 01/28/2011  . LIPOMA 10/22/2010  . VITAMIN D DEFICIENCY 08/28/2009  . Hypothyroidism 05/09/2008  . Allergic rhinitis 05/09/2008  . HERPES ZOSTER W/NERVOUS COMPLICATION NEC 12/78/7183  . HYPERTHYROIDISM 05/23/2007  . OBESITY 05/23/2007  . Anxiety state 05/23/2007  . Asthma 05/23/2007  . ENDOMETRIOSIS NOS 05/23/2007  . INSOMNIA, HX OF 05/23/2007    Scot Jun, PTA 05/08/2017, 11:49 AM  [place], [place], [address] Phone: [phone]   Fax:  [phone]  Name: Suzanne Hansen MRN: [id number] Date of Birth: [birth year]

## 2017-05-10 ENCOUNTER — Ambulatory Visit: Payer: BLUE CROSS/BLUE SHIELD | Admitting: Physical Therapy

## 2017-05-10 ENCOUNTER — Ambulatory Visit: Payer: BLUE CROSS/BLUE SHIELD | Attending: Family Medicine | Admitting: Physical Therapy

## 2017-05-10 ENCOUNTER — Encounter: Payer: Self-pay | Admitting: Physical Therapy

## 2017-05-10 DIAGNOSIS — R262 Difficulty in walking, not elsewhere classified: Secondary | ICD-10-CM | POA: Diagnosis present

## 2017-05-10 DIAGNOSIS — M25562 Pain in left knee: Secondary | ICD-10-CM | POA: Diagnosis present

## 2017-05-10 DIAGNOSIS — M62830 Muscle spasm of back: Secondary | ICD-10-CM

## 2017-05-10 DIAGNOSIS — M5442 Lumbago with sciatica, left side: Secondary | ICD-10-CM

## 2017-05-10 NOTE — Therapy (Signed)
[facility] [name], [place], [address] Phone: [phone]   Fax:  [phone]  Physical Therapy Treatment  Patient Details  Name: Suzanne Hansen MRN: [id number] Date of Birth: 09/12/1956 Referring Provider: Z.Smith  Encounter Date: 05/10/2017      PT End of Session - 05/10/17 1654    Visit Number 4   Date for PT Re-Evaluation 07/03/17   PT Start Time 3734   PT Stop Time 2876   PT Time Calculation (min) 61 min   Activity Tolerance Patient tolerated treatment well   Behavior During Therapy [facility] for tasks assessed/performed      Past Medical History:  Diagnosis Date  . ALLERGIC RHINITIS 05/09/2008  . Allergy   . ANXIETY 05/23/2007  . ARM PAIN, LEFT 10/23/2009  . Asthma   . ASTHMA 05/23/2007  . Cardiomyopathy ([place]) 09/08/2015  . CHF (congestive heart failure) ([place])   . CONTACT DERMATITIS 06/09/2009  . Diabetes mellitus   . ENDOMETRIOSIS NOS 05/23/2007  . FACIAL PAIN 06/02/2010  . FREQUENCY, URINARY 05/17/2010  . GLUCOSE INTOLERANCE 08/28/2009  . HERPES SIMPLEX, UNCOMPLICATED 05/20/5725  . Hyperlipidemia 08/28/2015  . HYPERTHYROIDISM 05/23/2007  . Hypoactive thyroid   . HYPOTHYROIDISM 05/09/2008  . Impaired glucose tolerance 01/28/2011  . INSOMNIA, HX OF 05/23/2007  . LATERAL EPICONDYLITIS, RIGHT 03/10/2009  . LIPOMA 10/22/2010  . NECK PAIN 11/10/2010  . OBESITY 05/23/2007  . Plantar fasciitis    Both feet  . SHOULDER PAIN, LEFT 05/17/2010  . SINUSITIS- ACUTE-NOS 11/03/2008  . SKIN LESION 11/10/2010  . Sleep apnea   . Unspecified Chest Pain 07/25/2008  . UNSPECIFIED URTICARIA 06/04/2009  . URI 08/28/2009  . UTI 04/29/2008  . VITAMIN D DEFICIENCY 08/28/2009  . Wheezing 07/12/2010    Past Surgical History:  Procedure Laterality Date  . COLONOSCOPY    . ENDOMETRIAL ABLATION    . LAMINECTOMY  1996  . LAPAROTOMY     exploratory  . POLYPECTOMY    . s/p endometrial ablation  12/06  . TUBAL LIGATION    .  UTERINE FIBROID SURGERY    . WISDOM TOOTH EXTRACTION      There were no vitals filed for this visit.      Subjective Assessment - 05/10/17 1616    Subjective Patient reports that she will be getting her last injection in the left knee.  She is reporting that she feels like the treatment that we are doing is "helping, I get sore but it is helping"   Currently in Pain? Yes   Pain Score 3    Pain Location Knee   Pain Orientation Left                         [facility] Adult PT Treatment/Exercise - 05/10/17 0001      Knee/Hip Exercises: Stretches   Gastroc Stretch 20 seconds;3 reps     Knee/Hip Exercises: Aerobic   Nustep level 5 x 7 minutes   Other Aerobic UBE level 5 x 6 minutes     Knee/Hip Exercises: Machines for Strengthening   Cybex Knee Extension 5# 2 sets 10   Cybex Knee Flexion 25lb 2x10    Other Machine Rows & lat pull 25# 2 sets 15, 20# straight arm lats     Knee/Hip Exercises: Standing   Hip Abduction 2 sets;10 reps   Abduction Limitations 2#   Hip Extension 2 sets;10 reps   Extension Limitations 2#  Knee/Hip Exercises: Seated   Other Seated Knee/Hip Exercises sit fit pelvic ROM 15 times each, weighted ball overhead lift     Knee/Hip Exercises: Supine   Other Supine Knee/Hip Exercises seated iso core holds physio ball 2x10      Moist Heat Therapy   Number Minutes Moist Heat 15 Minutes   Moist Heat Location Lumbar Spine;Cervical     Electrical Stimulation   Electrical Stimulation Location lumbar area   Electrical Stimulation Action IFC   Electrical Stimulation Parameters supine   Electrical Stimulation Goals Pain                PT Education - 05/10/17 1701    Education provided Yes   Education Details Issued HEP for Wms flexion, and red tband for scapular stabilization   Person(s) Educated Patient   Methods Explanation;Demonstration;Handout   Comprehension Verbalized understanding          PT Short Term Goals - 05/10/17  1659      PT SHORT TERM GOAL #1   Title independent with initial HEP   Status Partially Met           PT Long Term Goals - 05/02/17 0945      PT LONG TERM GOAL #1   Title decrease back pain 50%   Time 8   Period Weeks   Status New   Target Date 07/03/17     PT LONG TERM GOAL #2   Title decrease left knee pain 50%   Time 8   Period Weeks   Status New     PT LONG TERM GOAL #3   Title walk .5 miles without difficulty   Time 8   Period Weeks   Status New     PT LONG TERM GOAL #4   Title increase AROM of the lumbar spine 25%   Time 8   Period Weeks   Status New               Plan - 05/10/17 1655    Clinical Impression Statement Patient reports that the exercises are hard and make her sore but she is able to tolerate, she likes the pelvic sit fit exercises.  Had some difficulty with the hip extension and the abduction   PT Next Visit Plan she will have her last left knee injection tomorrow.  Add exercises as tolerated   Consulted and Agree with Plan of Care Patient      Patient will benefit from skilled therapeutic intervention in order to improve the following deficits and impairments:  Abnormal gait, Cardiopulmonary status limiting activity, Decreased activity tolerance, Decreased balance, Decreased mobility, Decreased strength, Increased edema, Improper body mechanics, Impaired flexibility, Pain, Increased muscle spasms, Difficulty walking, Decreased range of motion  Visit Diagnosis: Acute bilateral low back pain with left-sided sciatica  Acute pain of left knee  Difficulty in walking, not elsewhere classified  Muscle spasm of back     Problem List Patient Active Problem List   Diagnosis Date Noted  . Easy bruising 03/29/2017  . Left leg paresthesias 03/29/2017  . Muscle cramping 03/20/2017  . 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 Pain in lower jaw 11/10/2016  . Cough 11/06/2016  . Vaginitis 09/17/2016  . Left wrist pain 09/16/2016  .  Hypersomnia with sleep apnea 02/15/2016  . 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 Extrinsic asthma 02/15/2016  . Ankle edema 02/15/2016  . Degenerative arthritis of knee, bilateral 01/21/2016  . Asthma with exacerbation 10/20/2015  . Acute sinus infection  09/29/2015  . Cardiomyopathy ([place]) 09/08/2015  . Bilateral calf pain 08/31/2015  . Hyperlipidemia 08/28/2015  . Left knee pain 08/28/2015  . Varicose veins with pain 08/28/2015  . Peripheral edema 08/13/2015  . Injection site reaction 06/02/2015  . PHN (postherpetic neuralgia) 05/29/2015  . Unspecified asthma, with exacerbation 07/01/2014  . Shingles 06/10/2014  . Skin nodule 11/27/2012  . Diabetes ([place]) 01/28/2011  . Encounter for preventative adult health care exam with abnormal findings 01/28/2011  . LIPOMA 10/22/2010  . VITAMIN D DEFICIENCY 08/28/2009  . Hypothyroidism 05/09/2008  . Allergic rhinitis 05/09/2008  . HERPES ZOSTER W/NERVOUS COMPLICATION NEC 35/46/5681  . HYPERTHYROIDISM 05/23/2007  . OBESITY 05/23/2007  . Anxiety state 05/23/2007  . Asthma 05/23/2007  . ENDOMETRIOSIS NOS 05/23/2007  . INSOMNIA, HX OF 05/23/2007    Sumner Boast., PT 05/10/2017, 5:03 PM  [facility] [place] [place] [address], [place], [address] Phone: [phone]   Fax:  [phone]  Name: Suzanne Hansen MRN: [id number] Date of Birth: Dec 08, 1955

## 2017-05-11 ENCOUNTER — Encounter: Payer: Self-pay | Admitting: Family Medicine

## 2017-05-11 ENCOUNTER — Ambulatory Visit (INDEPENDENT_AMBULATORY_CARE_PROVIDER_SITE_OTHER): Payer: BLUE CROSS/BLUE SHIELD | Admitting: Family Medicine

## 2017-05-11 ENCOUNTER — Telehealth: Payer: Self-pay | Admitting: Family Medicine

## 2017-05-11 ENCOUNTER — Other Ambulatory Visit: Payer: Self-pay | Admitting: Internal Medicine

## 2017-05-11 DIAGNOSIS — M170 Bilateral primary osteoarthritis of knee: Secondary | ICD-10-CM

## 2017-05-11 NOTE — Patient Instructions (Signed)
Good to se you  I will miss you!  You get a break from me.  Ice is your friend and continue the exercises  See me again in 4-6 weeks!

## 2017-05-11 NOTE — Assessment & Plan Note (Signed)
Third and final injection given. Discussed continuing conservative therapy. Follow-up in 4-6 weeks

## 2017-05-11 NOTE — Progress Notes (Signed)
[facility] [place], [place] [address] Phone: [phone] Subjective:    I'm seeing this patient by the request  of:  Biagio Borg, MD   CC: Knee pain, Left  DEY:[id number]  Suzanne Hansen is a 61 y.o. female coming in with complaint of knee pain. This is more on the left side. Patient has known osteophytic changes of the knee. Patient has failed all conservative therapy. Here for third and final viscous supplementation injection. Patient continues to make some mild improvement. Patient denies any new symptoms.   patient denied bilateral knee x-rays done 01/25/2016. Independently visualized by me showing moderate osteophytic changes.  Past Medical History:  Diagnosis Date  . ALLERGIC RHINITIS 05/09/2008  . Allergy   . ANXIETY 05/23/2007  . ARM PAIN, LEFT 10/23/2009  . Asthma   . ASTHMA 05/23/2007  . Cardiomyopathy ([place]) 09/08/2015  . CHF (congestive heart failure) ([place])   . CONTACT DERMATITIS 06/09/2009  . Diabetes mellitus   . ENDOMETRIOSIS NOS 05/23/2007  . FACIAL PAIN 06/02/2010  . FREQUENCY, URINARY 05/17/2010  . GLUCOSE INTOLERANCE 08/28/2009  . HERPES SIMPLEX, UNCOMPLICATED 1/49/7026  . Hyperlipidemia 08/28/2015  . HYPERTHYROIDISM 05/23/2007  . Hypoactive thyroid   . HYPOTHYROIDISM 05/09/2008  . Impaired glucose tolerance 01/28/2011  . INSOMNIA, HX OF 05/23/2007  . LATERAL EPICONDYLITIS, RIGHT 03/10/2009  . LIPOMA 10/22/2010  . NECK PAIN 11/10/2010  . OBESITY 05/23/2007  . Plantar fasciitis    Both feet  . SHOULDER PAIN, LEFT 05/17/2010  . SINUSITIS- ACUTE-NOS 11/03/2008  . SKIN LESION 11/10/2010  . Sleep apnea   . Unspecified Chest Pain 07/25/2008  . UNSPECIFIED URTICARIA 06/04/2009  . URI 08/28/2009  . UTI 04/29/2008  . VITAMIN D DEFICIENCY 08/28/2009  . Wheezing 07/12/2010   Past Surgical History:  Procedure Laterality Date  . COLONOSCOPY    . ENDOMETRIAL ABLATION    . LAMINECTOMY  1996  . LAPAROTOMY     exploratory  .  POLYPECTOMY    . s/p endometrial ablation  12/06  . TUBAL LIGATION    . UTERINE FIBROID SURGERY    . WISDOM TOOTH EXTRACTION     Social History   Social History  . Marital status: Divorced    Spouse name: N/A  . Number of children: 3  . Years of education: N/A   Occupational History  . SOCIAL WORKER [employer]    [place]   Social History Main Topics  . Smoking status: Former Smoker    Packs/day: 0.50    Types: Cigarettes    Quit date: 11/05/1987  . Smokeless tobacco: Never Used  . Alcohol use No     Comment: WINE  . Drug use: No  . Sexual activity: Not on file   Other Topics Concern  . Not on file   Social History Narrative  . No narrative on file   Allergies  Allergen Reactions  . Ivp Dye [Iodinated Diagnostic Agents] Shortness Of Breath and Other (See Comments)    Swelling of extremity of the injection site  . Latex Itching    ITCHING AND COLD SORES AROUND MOUTH WHEN LATEX GLOVES USED BY THE DENTIST/HYGENTIST  . Clarithromycin Nausea And Vomiting    REACTION: nausea  vomiting  . Metronidazole Nausea And Vomiting   Family History  Problem Relation Age of Onset  . Diabetes Cousin   . Hypertension Cousin   . Heart disease Cousin   . Heart disease Mother  Rheumatic heart disease  . COPD Father   . Hypertension Father   . Cancer Maternal Grandmother        Breast  . Diabetes Sister   . Kidney disease Sister     Past medical history, social, surgical and family history all reviewed in electronic medical record.  No pertanent information unless stated regarding to the chief complaint.   Review of Systems: No headache, visual changes, nausea, vomiting, diarrhea, constipation, dizziness, abdominal pain, skin rash, fevers, chills, night sweats, weight loss, swollen lymph nodes, body aches, joint swelling,  chest pain, shortness of breath, mood changes.  Positive muscle aches  Objective  There were no vitals taken for this  visit. Systems examined below as of 05/11/17   General: No apparent distress alert and oriented x3 mood and affect normal, dressed appropriately.  HEENT: Pupils equal, extraocular movements intact  Respiratory: Patient's speak in full sentences and does not appear short of breath  Cardiovascular: Trace effusion with 1+ pulses of the lower extremities Skin: Warm dry intact with no signs of infection or rash on extremities or on axial skeleton.  Abdomen: Soft nontender  Neuro: Cranial nerves II through XII are intact, neurovascularly intact in all extremities with 2+ DTRs and 1+ pulses.  Lymph: No lymphadenopathy of posterior or anterior cervical chain or axillae bilaterally.  Gait antalgic gait MSK:  Non tender with full range of motion and good stability and symmetric strength and tone of shoulders, elbows, wrist, hip, and ankles bilaterally.   Knee: Left valgus deformity noted. Large thigh to calf ratio.  Tender to palpation over medial and PF joint line. Less than previous exam ROM full in flexion and extension and lower leg rotation. instability with valgus force.  painful patellar compression. Patellar glide with moderate crepitus. Patellar and quadriceps tendons unremarkable. Hamstring and quadriceps strength is normal. Contralateral knee shows mild arthritic changes  After informed written and verbal consent, patient was seated on exam table. Left knee was prepped with alcohol swab and utilizing anterolateral approach, patient's left knee space was injected with 16 mg/2.5 mL of Synvisc (sodium hyaluronate) in a prefilled syringe was injected easily into the knee through a 22-gauge needle.. Patient tolerated the procedure well without immediate complications.     Impression and Recommendations:     This case required medical decision making of moderate complexity.      Note: This dictation was prepared with Dragon dictation along with smaller phrase technology. Any  transcriptional errors that result from this process are unintentional.

## 2017-05-11 NOTE — Telephone Encounter (Signed)
States research study at [facility] will only do MRI on heart, abdomin and thigh.  Is requesting Dr. Tamala Julian to order MRI of back.

## 2017-05-12 NOTE — Telephone Encounter (Signed)
We will need to see her again to document it.  Sorry

## 2017-05-16 ENCOUNTER — Encounter: Payer: BLUE CROSS/BLUE SHIELD | Admitting: Physical Therapy

## 2017-05-16 ENCOUNTER — Encounter: Payer: Self-pay | Admitting: Physical Therapy

## 2017-05-16 ENCOUNTER — Encounter: Payer: Self-pay | Admitting: Family Medicine

## 2017-05-16 ENCOUNTER — Ambulatory Visit: Payer: BLUE CROSS/BLUE SHIELD | Admitting: Physical Therapy

## 2017-05-16 DIAGNOSIS — M5442 Lumbago with sciatica, left side: Secondary | ICD-10-CM | POA: Diagnosis not present

## 2017-05-16 DIAGNOSIS — R262 Difficulty in walking, not elsewhere classified: Secondary | ICD-10-CM

## 2017-05-16 DIAGNOSIS — M25562 Pain in left knee: Secondary | ICD-10-CM

## 2017-05-16 DIAGNOSIS — M62830 Muscle spasm of back: Secondary | ICD-10-CM

## 2017-05-16 NOTE — Therapy (Signed)
[facility] [address] [place], [place], [address] Phone: [phone]   Fax:  [phone]  Physical Therapy Treatment  Patient Details  Name: Suzanne Hansen MRN: [id number] Date of Birth: [birth year] Referring Provider: Z.Smith  Encounter Date: 05/16/2017      PT End of Session - 05/16/17 1440    Visit Number 5   Date for PT Re-Evaluation 07/03/17   PT Start Time 1400   PT Stop Time 1455   PT Time Calculation (min) 55 min      Past Medical History:  Diagnosis Date  . ALLERGIC RHINITIS 05/09/2008  . Allergy   . ANXIETY 05/23/2007  . ARM PAIN, LEFT 10/23/2009  . Asthma   . ASTHMA 05/23/2007  . Cardiomyopathy ([facility]) 09/08/2015  . CHF (congestive heart failure) ([facility])   . CONTACT DERMATITIS 06/09/2009  . Diabetes mellitus   . ENDOMETRIOSIS NOS 05/23/2007  . FACIAL PAIN 06/02/2010  . FREQUENCY, URINARY 05/17/2010  . GLUCOSE INTOLERANCE 08/28/2009  . HERPES SIMPLEX, UNCOMPLICATED 05/23/2007  . Hyperlipidemia 08/28/2015  . HYPERTHYROIDISM 05/23/2007  . Hypoactive thyroid   . HYPOTHYROIDISM 05/09/2008  . Impaired glucose tolerance 01/28/2011  . INSOMNIA, HX OF 05/23/2007  . LATERAL EPICONDYLITIS, RIGHT 03/10/2009  . LIPOMA 10/22/2010  . NECK PAIN 11/10/2010  . OBESITY 05/23/2007  . Plantar fasciitis    Both feet  . SHOULDER PAIN, LEFT 05/17/2010  . SINUSITIS- ACUTE-NOS 11/03/2008  . SKIN LESION 11/10/2010  . Sleep apnea   . Unspecified Chest Pain 07/25/2008  . UNSPECIFIED URTICARIA 06/04/2009  . URI 08/28/2009  . UTI 04/29/2008  . VITAMIN D DEFICIENCY 08/28/2009  . Wheezing 07/12/2010    Past Surgical History:  Procedure Laterality Date  . COLONOSCOPY    . ENDOMETRIAL ABLATION    . LAMINECTOMY  1996  . LAPAROTOMY     exploratory  . POLYPECTOMY    . s/p endometrial ablation  12/06  . TUBAL LIGATION    . UTERINE FIBROID SURGERY    . WISDOM TOOTH EXTRACTION      There were no vitals filed for this visit.       Subjective Assessment - 05/16/17 1406    Subjective hurting today, back and neck, got last knee injection and left lat distal leg still painful/numb   Currently in Pain? Yes   Pain Score 6    Pain Location Back                         [facility] Adult PT Treatment/Exercise - 05/16/17 0001      Knee/Hip Exercises: Aerobic   Nustep level 5 x 7 minutes   Other Aerobic UBE level 5 x 6 minutes     Knee/Hip Exercises: Machines for Strengthening   Cybex Knee Extension 5# 2 sets 10   Cybex Knee Flexion 25lb 2x10    Other Machine row 25# 2 sets 15  lat pull 20# 2 sets 15     Knee/Hip Exercises: Standing   Forward Step Up 10 reps;Hand Hold: 2;Step Height: 6"  opp leg ext     Knee/Hip Exercises: Seated   Other Seated Knee/Hip Exercises sit fit pelvic ROM 15 times each- LAQ,marching and hip abd     Moist Heat Therapy   Number Minutes Moist Heat 15 Minutes   Moist Heat Location Lumbar Spine     Electrical Stimulation   Electrical Stimulation Location lumb/cerv   Electrical Stimulation Action premod   Electrical  Stimulation Parameters sitting   Electrical Stimulation Goals Pain                  PT Short Term Goals - 05/10/17 1659      PT SHORT TERM GOAL #1   Title independent with initial HEP   Status Partially Met           PT Long Term Goals - 05/02/17 0945      PT LONG TERM GOAL #1   Title decrease back pain 50%   Time 8   Period Weeks   Status New   Target Date 07/03/17     PT LONG TERM GOAL #2   Title decrease left knee pain 50%   Time 8   Period Weeks   Status New     PT LONG TERM GOAL #3   Title walk .5 miles without difficulty   Time 8   Period Weeks   Status New     PT LONG TERM GOAL #4   Title increase AROM of the lumbar spine 25%   Time 8   Period Weeks   Status New               Plan - 05/16/17 1440    Clinical Impression Statement pt tolerating ex well but no pain relief. cuing for speed and control of ex  with core engagement   PT Treatment/Interventions ADLs/Self Care Home Management;Cryotherapy;Electrical Stimulation;Iontophoresis '4mg'$ /ml Dexamethasone;Moist Heat;Traction;Ultrasound;Therapeutic activities;Gait training;Stair training;Functional mobility training;Patient/family education;Neuromuscular re-education;Balance training;Therapeutic exercise;Manual techniques;Vasopneumatic Device   PT Next Visit Plan progress as tolerated, check goals      Patient will benefit from skilled therapeutic intervention in order to improve the following deficits and impairments:  Abnormal gait, Cardiopulmonary status limiting activity, Decreased activity tolerance, Decreased balance, Decreased mobility, Decreased strength, Increased edema, Improper body mechanics, Impaired flexibility, Pain, Increased muscle spasms, Difficulty walking, Decreased range of motion  Visit Diagnosis: Acute pain of left knee  Difficulty in walking, not elsewhere classified  Muscle spasm of back  Acute bilateral low back pain with left-sided sciatica     Problem List Patient Active Problem List   Diagnosis Date Noted  . Easy bruising 03/29/2017  . Left leg paresthesias 03/29/2017  . Muscle cramping 03/20/2017  . 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 Pain in lower jaw 11/10/2016  . Cough 11/06/2016  . Vaginitis 09/17/2016  . Left wrist pain 09/16/2016  . Hypersomnia with sleep apnea 02/15/2016  . 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 Extrinsic asthma 02/15/2016  . Ankle edema 02/15/2016  . Degenerative arthritis of knee, bilateral 01/21/2016  . Asthma with exacerbation 10/20/2015  . Acute sinus infection 09/29/2015  . Cardiomyopathy ([place]) 09/08/2015  . Bilateral calf pain 08/31/2015  . Hyperlipidemia 08/28/2015  . Left knee pain 08/28/2015  . Varicose veins with pain 08/28/2015  . Peripheral edema 08/13/2015  . Injection site reaction 06/02/2015  . PHN (postherpetic neuralgia) 05/29/2015  .  Unspecified asthma, with exacerbation 07/01/2014  . Shingles 06/10/2014  . Skin nodule 11/27/2012  . Diabetes ([place]) 01/28/2011  . Encounter for preventative adult health care exam with abnormal findings 01/28/2011  . LIPOMA 10/22/2010  . VITAMIN D DEFICIENCY 08/28/2009  . Hypothyroidism 05/09/2008  . Allergic rhinitis 05/09/2008  . HERPES ZOSTER W/NERVOUS COMPLICATION NEC 54/65/0354  . HYPERTHYROIDISM 05/23/2007  . OBESITY 05/23/2007  . Anxiety state 05/23/2007  . Asthma 05/23/2007  . ENDOMETRIOSIS NOS 05/23/2007  . INSOMNIA, HX OF 05/23/2007    PAYSEUR,ANGIE PTA 05/16/2017, 2:41 PM  [facility]  [address], [place], [address] Phone: [phone]   Fax:  [phone]  Name: Jesilyn Easom MRN: [id number] Date of Birth: 27-Nov-1955

## 2017-05-16 NOTE — Telephone Encounter (Signed)
Discussed in mychart msg.

## 2017-05-18 ENCOUNTER — Encounter: Payer: Self-pay | Admitting: Physical Therapy

## 2017-05-18 ENCOUNTER — Ambulatory Visit: Payer: BLUE CROSS/BLUE SHIELD | Admitting: Physical Therapy

## 2017-05-18 DIAGNOSIS — M25562 Pain in left knee: Secondary | ICD-10-CM

## 2017-05-18 DIAGNOSIS — M62830 Muscle spasm of back: Secondary | ICD-10-CM

## 2017-05-18 DIAGNOSIS — M5442 Lumbago with sciatica, left side: Secondary | ICD-10-CM | POA: Diagnosis not present

## 2017-05-18 DIAGNOSIS — R262 Difficulty in walking, not elsewhere classified: Secondary | ICD-10-CM

## 2017-05-18 NOTE — Therapy (Signed)
[place], [place], [address] Phone: [phone]   Fax:  [phone]  Physical Therapy Treatment  Patient Details  Name: Suzanne Hansen MRN: [id number] Date of Birth: 1956-08-10 Referring Provider: Z.Smith  Encounter Date: 05/18/2017      PT End of Session - 05/18/17 1408    Visit Number 6   Date for PT Re-Evaluation 07/03/17   PT Start Time 1330   PT Stop Time 1425   PT Time Calculation (min) 55 min      Past Medical History:  Diagnosis Date  . ALLERGIC RHINITIS 05/09/2008  . Allergy   . ANXIETY 05/23/2007  . ARM PAIN, LEFT 10/23/2009  . Asthma   . ASTHMA 05/23/2007  . Cardiomyopathy ([place]) 09/08/2015  . CHF (congestive heart failure) ([place])   . CONTACT DERMATITIS 06/09/2009  . Diabetes mellitus   . ENDOMETRIOSIS NOS 05/23/2007  . FACIAL PAIN 06/02/2010  . FREQUENCY, URINARY 05/17/2010  . GLUCOSE INTOLERANCE 08/28/2009  . HERPES SIMPLEX, UNCOMPLICATED 5/63/8756  . Hyperlipidemia 08/28/2015  . HYPERTHYROIDISM 05/23/2007  . Hypoactive thyroid   . HYPOTHYROIDISM 05/09/2008  . Impaired glucose tolerance 01/28/2011  . INSOMNIA, HX OF 05/23/2007  . LATERAL EPICONDYLITIS, RIGHT 03/10/2009  . LIPOMA 10/22/2010  . NECK PAIN 11/10/2010  . OBESITY 05/23/2007  . Plantar fasciitis    Both feet  . SHOULDER PAIN, LEFT 05/17/2010  . SINUSITIS- ACUTE-NOS 11/03/2008  . SKIN LESION 11/10/2010  . Sleep apnea   . Unspecified Chest Pain 07/25/2008  . UNSPECIFIED URTICARIA 06/04/2009  . URI 08/28/2009  . UTI 04/29/2008  . VITAMIN D DEFICIENCY 08/28/2009  . Wheezing 07/12/2010    Past Surgical History:  Procedure Laterality Date  . COLONOSCOPY    . ENDOMETRIAL ABLATION    . LAMINECTOMY  1996  . LAPAROTOMY     exploratory  . POLYPECTOMY    . s/p endometrial ablation  12/06  . TUBAL LIGATION    . UTERINE FIBROID SURGERY    . WISDOM TOOTH EXTRACTION      There were no vitals filed for this visit.       Subjective Assessment - 05/18/17 1329    Subjective running all morning, legs very swollen- emailed cardiologist   Currently in Pain? Yes   Pain Score 5    Pain Location Back                         [facility] Adult PT Treatment/Exercise - 05/18/17 0001      Knee/Hip Exercises: Aerobic   Nustep level 5 x 7 minutes   Other Aerobic UBE level 5 x 6 minutes     Knee/Hip Exercises: Machines for Strengthening   Cybex Knee Extension 10# 2 sets 10   Cybex Knee Flexion 25lb 2x10    Other Machine row  and lats 25# 2 sets 15  black tband trunk ext 2 sets 10     Knee/Hip Exercises: Standing   Other Standing Knee Exercises hip 3 way with 5# cable 15 times each     Knee/Hip Exercises: Supine   Bridges with Diona Foley Squeeze Strengthening;Both;15 reps   Bridges with Clamshell Both;15 reps  green tband   Other Supine Knee/Hip Exercises bridge with ball under feet, obl with ball, isometric abd 15 each     Moist Heat Therapy   Number Minutes Moist Heat 15 Minutes   Moist Heat Location Lumbar Spine     Electrical Stimulation  Psychologist, forensic IFC   Electrical Stimulation Parameters sitting   Electrical Stimulation Goals Pain                  PT Short Term Goals - 05/18/17 1408      PT SHORT TERM GOAL #1   Title independent with initial HEP   Status Achieved           PT Long Term Goals - 05/18/17 1409      PT LONG TERM GOAL #1   Title decrease back pain 50%   Status On-going     PT LONG TERM GOAL #2   Title decrease left knee pain 50%   Status On-going     PT LONG TERM GOAL #3   Title walk .5 miles without difficulty   Status On-going     PT LONG TERM GOAL #4   Title increase AROM of the lumbar spine 25%   Status On-going               Plan - 05/18/17 1409    Clinical Impression Statement LE swelling and several HS cramps esp on Left. Progressing with goals. tolerate ex fair.   PT  Next Visit Plan continue with strengthening to decrease pain and increase ROM      Patient will benefit from skilled therapeutic intervention in order to improve the following deficits and impairments:     Visit Diagnosis: Acute pain of left knee  Difficulty in walking, not elsewhere classified  Muscle spasm of back  Acute bilateral low back pain with left-sided sciatica     Problem List Patient Active Problem List   Diagnosis Date Noted  . Easy bruising 03/29/2017  . Left leg paresthesias 03/29/2017  . Muscle cramping 03/20/2017  . 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 Pain in lower jaw 11/10/2016  . Cough 11/06/2016  . Vaginitis 09/17/2016  . Left wrist pain 09/16/2016  . Hypersomnia with sleep apnea 02/15/2016  . 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 Extrinsic asthma 02/15/2016  . Ankle edema 02/15/2016  . Degenerative arthritis of knee, bilateral 01/21/2016  . Asthma with exacerbation 10/20/2015  . Acute sinus infection 09/29/2015  . Cardiomyopathy ([place]) 09/08/2015  . Bilateral calf pain 08/31/2015  . Hyperlipidemia 08/28/2015  . Left knee pain 08/28/2015  . Varicose veins with pain 08/28/2015  . Peripheral edema 08/13/2015  . Injection site reaction 06/02/2015  . PHN (postherpetic neuralgia) 05/29/2015  . Unspecified asthma, with exacerbation 07/01/2014  . Shingles 06/10/2014  . Skin nodule 11/27/2012  . Diabetes ([place]) 01/28/2011  . Encounter for preventative adult health care exam with abnormal findings 01/28/2011  . LIPOMA 10/22/2010  . VITAMIN D DEFICIENCY 08/28/2009  . Hypothyroidism 05/09/2008  . Allergic rhinitis 05/09/2008  . HERPES ZOSTER W/NERVOUS COMPLICATION NEC 91/79/1505  . HYPERTHYROIDISM 05/23/2007  . OBESITY 05/23/2007  . Anxiety state 05/23/2007  . Asthma 05/23/2007  . ENDOMETRIOSIS NOS 05/23/2007  . INSOMNIA, HX OF 05/23/2007    Yides Saidi,ANGIE PTA 05/18/2017, 2:11 PM  [place] [address] [place] [place], [place], [address] Phone: [phone]   Fax:  [phone]  Name: Donja Tipping MRN: [id number] Date of Birth: 1956-03-12

## 2017-05-19 ENCOUNTER — Other Ambulatory Visit: Payer: Self-pay | Admitting: Internal Medicine

## 2017-05-23 ENCOUNTER — Ambulatory Visit: Payer: BLUE CROSS/BLUE SHIELD | Admitting: Physical Therapy

## 2017-05-25 ENCOUNTER — Encounter: Payer: Self-pay | Admitting: Physical Therapy

## 2017-05-25 ENCOUNTER — Ambulatory Visit: Payer: BLUE CROSS/BLUE SHIELD | Admitting: Physical Therapy

## 2017-05-25 DIAGNOSIS — R262 Difficulty in walking, not elsewhere classified: Secondary | ICD-10-CM

## 2017-05-25 DIAGNOSIS — M5442 Lumbago with sciatica, left side: Secondary | ICD-10-CM | POA: Diagnosis not present

## 2017-05-25 DIAGNOSIS — M62830 Muscle spasm of back: Secondary | ICD-10-CM

## 2017-05-25 DIAGNOSIS — M25562 Pain in left knee: Secondary | ICD-10-CM

## 2017-05-25 NOTE — Therapy (Signed)
[place], [place], [address] Phone: [phone]   Fax:  [phone]  Physical Therapy Treatment  Patient Details  Name: Suzanne Hansen MRN: [id number] Date of Birth: 28-Aug-1956 Referring Provider: Z.Smith  Encounter Date: 05/25/2017      PT End of Session - 05/25/17 1651    Visit Number 7   Date for PT Re-Evaluation 07/03/17   PT Start Time 1601   PT Stop Time 1706   PT Time Calculation (min) 65 min   Activity Tolerance Patient tolerated treatment well   Behavior During Therapy [facility] for tasks assessed/performed      Past Medical History:  Diagnosis Date  . ALLERGIC RHINITIS 05/09/2008  . Allergy   . ANXIETY 05/23/2007  . ARM PAIN, LEFT 10/23/2009  . Asthma   . ASTHMA 05/23/2007  . Cardiomyopathy ([place]) 09/08/2015  . CHF (congestive heart failure) ([place])   . CONTACT DERMATITIS 06/09/2009  . Diabetes mellitus   . ENDOMETRIOSIS NOS 05/23/2007  . FACIAL PAIN 06/02/2010  . FREQUENCY, URINARY 05/17/2010  . GLUCOSE INTOLERANCE 08/28/2009  . HERPES SIMPLEX, UNCOMPLICATED 7/78/2423  . Hyperlipidemia 08/28/2015  . HYPERTHYROIDISM 05/23/2007  . Hypoactive thyroid   . HYPOTHYROIDISM 05/09/2008  . Impaired glucose tolerance 01/28/2011  . INSOMNIA, HX OF 05/23/2007  . LATERAL EPICONDYLITIS, RIGHT 03/10/2009  . LIPOMA 10/22/2010  . NECK PAIN 11/10/2010  . OBESITY 05/23/2007  . Plantar fasciitis    Both feet  . SHOULDER PAIN, LEFT 05/17/2010  . SINUSITIS- ACUTE-NOS 11/03/2008  . SKIN LESION 11/10/2010  . Sleep apnea   . Unspecified Chest Pain 07/25/2008  . UNSPECIFIED URTICARIA 06/04/2009  . URI 08/28/2009  . UTI 04/29/2008  . VITAMIN D DEFICIENCY 08/28/2009  . Wheezing 07/12/2010    Past Surgical History:  Procedure Laterality Date  . COLONOSCOPY    . ENDOMETRIAL ABLATION    . LAMINECTOMY  1996  . LAPAROTOMY     exploratory  . POLYPECTOMY    . s/p endometrial ablation  12/06  . TUBAL LIGATION    .  UTERINE FIBROID SURGERY    . WISDOM TOOTH EXTRACTION      There were no vitals filed for this visit.      Subjective Assessment - 05/25/17 1600    Subjective Pt reports going to wake today "lord they worked me over" Pt reports having a stress test MRI and full body bone density scan.   Currently in Pain? Yes   Pain Score 7    Pain Location --  Back hip and L side                         [facility] Adult PT Treatment/Exercise - 05/25/17 0001      Knee/Hip Exercises: Aerobic   Nustep level 5 x 7 minutes   Other Aerobic UBE level 5 x 5 minutes     Knee/Hip Exercises: Machines for Strengthening   Cybex Knee Extension 10# 2 sets 10   Cybex Knee Flexion 25lb 2x15   Other Machine row  and lats 25# 2 sets 15     Knee/Hip Exercises: Standing   Forward Step Up 10 reps;Hand Hold: 2;Step Height: 6"   Other Standing Knee Exercises hip 3 way with 5# cable 15 times each   Other Standing Knee Exercises Tband rows & ext  green tband 2x15     Knee/Hip Exercises: Supine   Other Supine Knee/Hip Exercises bridge with ball  under feet, obl with ball, isometric abd 15 each     Moist Heat Therapy   Number Minutes Moist Heat 15 Minutes   Moist Heat Location Lumbar Spine     Electrical Stimulation   Electrical Stimulation Location lumbar   Electrical Stimulation Action IFC   Electrical Stimulation Parameters Sitting   Electrical Stimulation Goals Pain                  PT Short Term Goals - 05/18/17 1408      PT SHORT TERM GOAL #1   Title independent with initial HEP   Status Achieved           PT Long Term Goals - 05/18/17 1409      PT LONG TERM GOAL #1   Title decrease back pain 50%   Status On-going     PT LONG TERM GOAL #2   Title decrease left knee pain 50%   Status On-going     PT LONG TERM GOAL #3   Title walk .5 miles without difficulty   Status On-going     PT LONG TERM GOAL #4   Title increase AROM of the lumbar spine 25%   Status On-going                Plan - 05/25/17 1656    Clinical Impression Statement Pt reports constant LBP that goes to L hip. Despite reports she was able to complete all of today's exercises. Does display most weakness with standing hip exercises and supine interventions.   Rehab Potential Good   PT Frequency 2x / week   PT Duration 8 weeks   PT Treatment/Interventions ADLs/Self Care Home Management;Cryotherapy;Electrical Stimulation;Iontophoresis 4mg /ml Dexamethasone;Moist Heat;Traction;Ultrasound;Therapeutic activities;Gait training;Stair training;Functional mobility training;Patient/family education;Neuromuscular re-education;Balance training;Therapeutic exercise;Manual techniques;Vasopneumatic Device   PT Next Visit Plan continue with strengthening to decrease pain and increase ROM      Patient will benefit from skilled therapeutic intervention in order to improve the following deficits and impairments:  Abnormal gait, Cardiopulmonary status limiting activity, Decreased activity tolerance, Decreased balance, Decreased mobility, Decreased strength, Increased edema, Improper body mechanics, Impaired flexibility, Pain, Increased muscle spasms, Difficulty walking, Decreased range of motion  Visit Diagnosis: Acute pain of left knee  Difficulty in walking, not elsewhere classified  Muscle spasm of back  Acute bilateral low back pain with left-sided sciatica     Problem List Patient Active Problem List   Diagnosis Date Noted  . Easy bruising 03/29/2017  . Left leg paresthesias 03/29/2017  . Muscle cramping 03/20/2017  . 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 Pain in lower jaw 11/10/2016  . Cough 11/06/2016  . Vaginitis 09/17/2016  . Left wrist pain 09/16/2016  . Hypersomnia with sleep apnea 02/15/2016  . 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 Extrinsic asthma 02/15/2016  . Ankle edema 02/15/2016  . Degenerative arthritis of knee, bilateral 01/21/2016  . Asthma with  exacerbation 10/20/2015  . Acute sinus infection 09/29/2015  . Cardiomyopathy ([place]) 09/08/2015  . Bilateral calf pain 08/31/2015  . Hyperlipidemia 08/28/2015  . Left knee pain 08/28/2015  . Varicose veins with pain 08/28/2015  . Peripheral edema 08/13/2015  . Injection site reaction 06/02/2015  . PHN (postherpetic neuralgia) 05/29/2015  . Unspecified asthma, with exacerbation 07/01/2014  . Shingles 06/10/2014  . Skin nodule 11/27/2012  . Diabetes ([place]) 01/28/2011  . Encounter for preventative adult health care exam with abnormal findings 01/28/2011  . LIPOMA 10/22/2010  . VITAMIN D DEFICIENCY 08/28/2009  . Hypothyroidism 05/09/2008  .  Allergic rhinitis 05/09/2008  . HERPES ZOSTER W/NERVOUS COMPLICATION NEC 24/08/4642  . HYPERTHYROIDISM 05/23/2007  . OBESITY 05/23/2007  . Anxiety state 05/23/2007  . Asthma 05/23/2007  . ENDOMETRIOSIS NOS 05/23/2007  . INSOMNIA, HX OF 05/23/2007    Scot Jun, PTA 05/25/2017, 4:58 PM  [place] [address] [place], [place], [address] Phone: [phone]   Fax:  [phone]  Name: Khristy Kalan MRN: [id number] Date of Birth: Apr 20, 1956

## 2017-05-30 ENCOUNTER — Ambulatory Visit: Payer: BLUE CROSS/BLUE SHIELD | Admitting: Physical Therapy

## 2017-05-30 ENCOUNTER — Encounter: Payer: Self-pay | Admitting: Physical Therapy

## 2017-05-30 DIAGNOSIS — M62830 Muscle spasm of back: Secondary | ICD-10-CM

## 2017-05-30 DIAGNOSIS — R262 Difficulty in walking, not elsewhere classified: Secondary | ICD-10-CM

## 2017-05-30 DIAGNOSIS — M5442 Lumbago with sciatica, left side: Secondary | ICD-10-CM

## 2017-05-30 DIAGNOSIS — M25562 Pain in left knee: Secondary | ICD-10-CM

## 2017-05-30 NOTE — Therapy (Signed)
[place], [place], [address] Phone: [phone]   Fax:  [phone]  Physical Therapy Treatment  Patient Details  Name: Suzanne Hansen MRN: [id number] Date of Birth: 04/02/1956 Referring Provider: Z.Smith  Encounter Date: 05/30/2017      PT End of Session - 05/30/17 1450    Visit Number 8   Date for PT Re-Evaluation 07/03/17   PT Start Time 1400   PT Stop Time 1445   PT Time Calculation (min) 45 min      Past Medical History:  Diagnosis Date  . ALLERGIC RHINITIS 05/09/2008  . Allergy   . ANXIETY 05/23/2007  . ARM PAIN, LEFT 10/23/2009  . Asthma   . ASTHMA 05/23/2007  . Cardiomyopathy ([place]) 09/08/2015  . CHF (congestive heart failure) ([place])   . CONTACT DERMATITIS 06/09/2009  . Diabetes mellitus   . ENDOMETRIOSIS NOS 05/23/2007  . FACIAL PAIN 06/02/2010  . FREQUENCY, URINARY 05/17/2010  . GLUCOSE INTOLERANCE 08/28/2009  . HERPES SIMPLEX, UNCOMPLICATED 7/51/0258  . Hyperlipidemia 08/28/2015  . HYPERTHYROIDISM 05/23/2007  . Hypoactive thyroid   . HYPOTHYROIDISM 05/09/2008  . Impaired glucose tolerance 01/28/2011  . INSOMNIA, HX OF 05/23/2007  . LATERAL EPICONDYLITIS, RIGHT 03/10/2009  . LIPOMA 10/22/2010  . NECK PAIN 11/10/2010  . OBESITY 05/23/2007  . Plantar fasciitis    Both feet  . SHOULDER PAIN, LEFT 05/17/2010  . SINUSITIS- ACUTE-NOS 11/03/2008  . SKIN LESION 11/10/2010  . Sleep apnea   . Unspecified Chest Pain 07/25/2008  . UNSPECIFIED URTICARIA 06/04/2009  . URI 08/28/2009  . UTI 04/29/2008  . VITAMIN D DEFICIENCY 08/28/2009  . Wheezing 07/12/2010    Past Surgical History:  Procedure Laterality Date  . COLONOSCOPY    . ENDOMETRIAL ABLATION    . LAMINECTOMY  1996  . LAPAROTOMY     exploratory  . POLYPECTOMY    . s/p endometrial ablation  12/06  . TUBAL LIGATION    . UTERINE FIBROID SURGERY    . WISDOM TOOTH EXTRACTION      There were no vitals filed for this visit.       Subjective Assessment - 05/30/17 1447    Subjective i almost cancelled I am hurting so bad in back and down Left leg- saw neuro MD and he is ordering MRI   Currently in Pain? Yes   Pain Score 10-Worst pain ever   Pain Location Back   Pain Orientation Left                         [facility] Adult PT Treatment/Exercise - 05/30/17 0001      Modalities   Modalities Traction     Moist Heat Therapy   Number Minutes Moist Heat 15 Minutes   Moist Heat Location Lumbar Spine     Traction   Type of Traction Lumbar   Max (lbs) 50   Time 15     Manual Therapy   Manual Therapy Passive ROM;Other (comment)   Passive ROM LE    Other Manual Therapy preformed and issued REIS and prone lying to pt for home as symptoms did decrease soem with ext                PT Education - 05/30/17 1450    Education provided Yes   Education Details prone lying. REIS   Person(s) Educated Patient   Methods Explanation;Demonstration   Comprehension Verbalized understanding;Returned demonstration  PT Short Term Goals - 05/18/17 1408      PT SHORT TERM GOAL #1   Title independent with initial HEP   Status Achieved           PT Long Term Goals - 05/18/17 1409      PT LONG TERM GOAL #1   Title decrease back pain 50%   Status On-going     PT LONG TERM GOAL #2   Title decrease left knee pain 50%   Status On-going     PT LONG TERM GOAL #3   Title walk .5 miles without difficulty   Status On-going     PT LONG TERM GOAL #4   Title increase AROM of the lumbar spine 25%   Status On-going               Plan - 05/30/17 1450    Clinical Impression Statement increased pain today and radiating down Left leg, trial of traction with relief and educ on REIS for radiating symptom relief.    PT Next Visit Plan assess and progress as able      Patient will benefit from skilled therapeutic intervention in order to improve the following deficits and impairments:   Abnormal gait, Cardiopulmonary status limiting activity, Decreased activity tolerance, Decreased balance, Decreased mobility, Decreased strength, Increased edema, Improper body mechanics, Impaired flexibility, Pain, Increased muscle spasms, Difficulty walking, Decreased range of motion  Visit Diagnosis: Difficulty in walking, not elsewhere classified  Acute pain of left knee  Muscle spasm of back  Acute bilateral low back pain with left-sided sciatica     Problem List Patient Active Problem List   Diagnosis Date Noted  . Easy bruising 03/29/2017  . Left leg paresthesias 03/29/2017  . Muscle cramping 03/20/2017  . 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 Pain in lower jaw 11/10/2016  . Cough 11/06/2016  . Vaginitis 09/17/2016  . Left wrist pain 09/16/2016  . Hypersomnia with sleep apnea 02/15/2016  . 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 Extrinsic asthma 02/15/2016  . Ankle edema 02/15/2016  . Degenerative arthritis of knee, bilateral 01/21/2016  . Asthma with exacerbation 10/20/2015  . Acute sinus infection 09/29/2015  . Cardiomyopathy ([place]) 09/08/2015  . Bilateral calf pain 08/31/2015  . Hyperlipidemia 08/28/2015  . Left knee pain 08/28/2015  . Varicose veins with pain 08/28/2015  . Peripheral edema 08/13/2015  . Injection site reaction 06/02/2015  . PHN (postherpetic neuralgia) 05/29/2015  . Unspecified asthma, with exacerbation 07/01/2014  . Shingles 06/10/2014  . Skin nodule 11/27/2012  . Diabetes ([place]) 01/28/2011  . Encounter for preventative adult health care exam with abnormal findings 01/28/2011  . LIPOMA 10/22/2010  . VITAMIN D DEFICIENCY 08/28/2009  . Hypothyroidism 05/09/2008  . Allergic rhinitis 05/09/2008  . HERPES ZOSTER W/NERVOUS COMPLICATION NEC 09/60/4540  . HYPERTHYROIDISM 05/23/2007  . OBESITY 05/23/2007  . Anxiety state 05/23/2007  . Asthma 05/23/2007  . ENDOMETRIOSIS NOS 05/23/2007  . INSOMNIA, HX OF 05/23/2007    PAYSEUR,ANGIE  PTA 05/30/2017, 2:54 PM  [place] [address] [place], [place], [address] Phone: [phone]   Fax:  [phone]  Name: Suzanne Hansen MRN: [id number] Date of Birth: [birth year]

## 2017-06-01 ENCOUNTER — Ambulatory Visit: Payer: BLUE CROSS/BLUE SHIELD | Admitting: Physical Therapy

## 2017-06-14 ENCOUNTER — Ambulatory Visit (INDEPENDENT_AMBULATORY_CARE_PROVIDER_SITE_OTHER): Payer: BLUE CROSS/BLUE SHIELD | Admitting: Internal Medicine

## 2017-06-14 ENCOUNTER — Other Ambulatory Visit: Payer: BLUE CROSS/BLUE SHIELD

## 2017-06-14 ENCOUNTER — Encounter: Payer: Self-pay | Admitting: Internal Medicine

## 2017-06-14 VITALS — BP 140/88 | HR 78 | Temp 97.8°F | Ht 64.5 in | Wt 237.0 lb

## 2017-06-14 DIAGNOSIS — E039 Hypothyroidism, unspecified: Secondary | ICD-10-CM

## 2017-06-14 DIAGNOSIS — E119 Type 2 diabetes mellitus without complications: Secondary | ICD-10-CM

## 2017-06-14 DIAGNOSIS — M5417 Radiculopathy, lumbosacral region: Secondary | ICD-10-CM | POA: Diagnosis not present

## 2017-06-14 MED ORDER — GABAPENTIN 300 MG PO CAPS: 300.0000 mg | ORAL_CAPSULE | Freq: Three times a day (TID) | ORAL | 5 refills | Status: DC

## 2017-06-14 MED ORDER — PREDNISONE 10 MG PO TABS: ORAL_TABLET | ORAL | 0 refills | Status: DC

## 2017-06-14 NOTE — Patient Instructions (Signed)
Please follow up with Dr Saintclair Halsted regarding the surgury  Please take all new medication as prescribed - the prednisone short course, but also the increased gabapentin to 300 mg three times per day  The gabapentin can be increased to 600 mg three times per day if 300 is not enough  Please continue all other medications as before, and refills have been done if requested.  Please have the pharmacy call with any other refills you may need.  Please keep your appointments with your specialists as you may have planned  Please go to the LAB in the Basement (turn left off the elevator) for the tests to be done today - just the thyroid testing today  You will be contacted by phone if any changes need to be made immediately.  Otherwise, you will receive a letter about your results with an explanation, but please check with MyChart first.  Please remember to sign up for MyChart if you have not done so, as this will be important to you in the future with finding out test results, communicating by private email, and scheduling acute appointments online when needed.

## 2017-06-14 NOTE — Assessment & Plan Note (Signed)
Lab Results  Component Value Date   HGBA1C 5.9 03/21/2017  stable overall by history and exam, recent data reviewed with pt, and pt to continue medical treatment as before,  to f/u any worsening symptoms or concerns

## 2017-06-14 NOTE — Assessment & Plan Note (Signed)
Pt c/o pain, will increase the gabapentin to 300 tid, repeat prednisone taper, and to f/u with Dr Saintclair Halsted for surgical intervention of the cyst

## 2017-06-14 NOTE — Assessment & Plan Note (Signed)
Asympt, with recent med dose change, ok for repeat TFT's today to reasses, cont same dose for now

## 2017-06-14 NOTE — Progress Notes (Signed)
Subjective:    Patient ID: Suzanne Hansen, female    DOB: 12/09/1955, 61 y.o.   MRN: [id number]  HPI  Here to f/u with back pain, has known Left knee arthritis, and recent LE PAD evaluation negative per pt report .  Has recently seen  Dr Joselyn Glassman 8/20 with clinical L4 radiculopathy findings, pt could not tolerate the NCS to the LE's so this was not completed.    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   Was recommended for Dr Jacquelynn Cree for [facility] but she has deferred for now, but now thinking of reversing that as well since now off the prednisone x 3 days the pain is starting to come back  Pt denies chest pain, increased sob or doe, wheezing, orthopnea, PND, increased LE swelling, palpitations, dizziness or syncope.   Pt denies polydipsia, polyuria.  Did have recent thyroid med dose change June 2018 - Denies hyper or hypo thyroid symptoms such as voice, skin or hair change. Past Medical History:  Diagnosis Date  . ALLERGIC RHINITIS 05/09/2008  . Allergy   . ANXIETY 05/23/2007  . ARM PAIN, LEFT 10/23/2009  . Asthma   . ASTHMA 05/23/2007  . Cardiomyopathy ([place]) 09/08/2015  . CHF (congestive heart failure) ([place])   . CONTACT DERMATITIS 06/09/2009  . Diabetes mellitus   . ENDOMETRIOSIS NOS 05/23/2007  . FACIAL PAIN 06/02/2010  . FREQUENCY, URINARY 05/17/2010  . GLUCOSE INTOLERANCE 08/28/2009  . HERPES SIMPLEX, UNCOMPLICATED 4/33/2951  . Hyperlipidemia 08/28/2015  . HYPERTHYROIDISM 05/23/2007  . Hypoactive thyroid   . HYPOTHYROIDISM 05/09/2008  . Impaired glucose tolerance 01/28/2011  . INSOMNIA, HX OF 05/23/2007  . LATERAL EPICONDYLITIS, RIGHT 03/10/2009  . LIPOMA 10/22/2010  . NECK PAIN 11/10/2010  . OBESITY  05/23/2007  . Plantar fasciitis    Both feet  . SHOULDER PAIN, LEFT 05/17/2010  . SINUSITIS- ACUTE-NOS 11/03/2008  . SKIN LESION 11/10/2010  . Sleep apnea   . Unspecified Chest Pain 07/25/2008  . UNSPECIFIED URTICARIA 06/04/2009  . URI 08/28/2009  . UTI 04/29/2008  . VITAMIN D DEFICIENCY 08/28/2009  . Wheezing 07/12/2010   Past Surgical History:  Procedure Laterality Date  . COLONOSCOPY    . ENDOMETRIAL ABLATION    . LAMINECTOMY  1996  . LAPAROTOMY     exploratory  . POLYPECTOMY    . s/p endometrial ablation  12/06  . TUBAL LIGATION    . UTERINE FIBROID SURGERY    . WISDOM TOOTH EXTRACTION      reports that she quit smoking about 29 years ago. Her smoking use included Cigarettes. She smoked 0.50 packs per day. She has never used smokeless tobacco. She reports that she does not drink alcohol or use drugs. family history includes COPD in her father; Cancer in her maternal grandmother; Diabetes in her cousin and sister; Heart disease in her cousin and mother; Hypertension in her cousin and father; Kidney disease in her sister. Allergies  Allergen Reactions  . Ivp Dye [Iodinated Diagnostic Agents] Shortness Of Breath and Other (See Comments)    Swelling of extremity of the injection site  . Latex Itching    ITCHING AND COLD SORES AROUND MOUTH WHEN  LATEX GLOVES USED BY THE DENTIST/HYGENTIST  . Clarithromycin Nausea And Vomiting    REACTION: nausea  vomiting  . Metronidazole Nausea And Vomiting   Current Outpatient Prescriptions on File Prior to Visit  Medication Sig Dispense Refill  . aspirin 81 MG EC tablet Take 81 mg by mouth daily.      . carvedilol (COREG) 3.125 MG tablet Take 1 tablet (3.125 mg total) by mouth 2 (two) times daily. 180 tablet 3  . Cholecalciferol (VITAMIN D) 2000 UNITS CAPS Take 2 capsules by mouth daily.    . Diclofenac Sodium 2 % SOLN Place 1 application onto the skin 2 (two) times daily. 112 g 3  . fexofenadine (ALLEGRA) 180 MG tablet Take 180 mg by mouth daily.       . furosemide (LASIX) 40 MG tablet TAKE 1 TABLET (40 MG TOTAL) BY MOUTH DAILY. 90 tablet 3  . levothyroxine (SYNTHROID, LEVOTHROID) 112 MCG tablet Take 1 tablet (112 mcg total) by mouth daily. 90 tablet 3  . losartan (COZAAR) 50 MG tablet Take 1 tablet (50 mg total) by mouth daily. 90 tablet 3  . magnesium oxide (MAG-OX) 400 MG tablet Take 400 mg by mouth daily.    . meloxicam (MOBIC) 15 MG tablet Take 1 tablet (15 mg total) by mouth daily. 90 tablet 2  . Multiple Vitamin (MULTIVITAMIN) capsule Take 1 capsule by mouth daily.      [name] PROAIR HFA 108 (90 Base) MCG/ACT inhaler INHALE 2 PUFFS INTO THE LUNGS EVERY 4 (FOUR) HOURS AS NEEDED FOR WHEEZING OR SHORTNESS OF BREATH. 8.5 Inhaler 1  . rosuvastatin (CRESTOR) 10 MG tablet TAKE 1 TABLET BY MOUTH EVERY DAY 90 tablet 3  . SYMBICORT 160-4.5 MCG/ACT inhaler INHALE 2 PUFFS INTO THE LUNGS 2 (TWO) TIMES DAILY. 10.2 Inhaler 2  . valACYclovir (VALTREX) 1000 MG tablet Take 500 mg by mouth daily.  0   Current Facility-Administered Medications on File Prior to Visit  Medication Dose Route Frequency Provider Last Rate Last Dose  . 0.9 %  sodium chloride infusion  500 mL Intravenous Continuous Nandigam, Kavitha V, MD       Review of Systems  Constitutional: Negative for other unusual diaphoresis or sweats HENT: Negative for ear discharge or swelling Eyes: Negative for other worsening visual disturbances Respiratory: Negative for stridor or other swelling  Gastrointestinal: Negative for worsening distension or other blood Genitourinary: Negative for retention or other urinary change Musculoskeletal: Negative for other MSK pain or swelling Skin: Negative for color change or other new lesions Neurological: Negative for worsening tremors and other numbness  Psychiatric/Behavioral: Negative for worsening agitation or other fatigue All other system neg per pt    Objective:   Physical Exam BP 140/88   Pulse 78   Temp 97.8 F (36.6 C) (Oral)   Ht 5'  4.5" (1.638 m)   Wt 237 lb (107.5 kg)   SpO2 98%   BMI 40.05 kg/m  VS noted,  Constitutional: Pt appears in NAD HENT: Head: NCAT.  Right Ear: External ear normal.  Left Ear: External ear normal.  Eyes: . Pupils are equal, round, and reactive to light. Conjunctivae and EOM are normal Nose: without d/c or deformity Neck: Neck supple. Gross normal ROM Cardiovascular: Normal rate and regular rhythm.   Pulmonary/Chest: Effort normal and breath sounds without rales or wheezing.  Abd:  Soft, NT, ND, + BS, no organomegaly Spine NT in the midline Neurological: Pt is alert. At baseline orientation, motor 5/5 intact except 4+/5 LLE Skin:  Skin is warm. No rashes, other new lesions, no LE edema Psychiatric: Pt behavior is normal without agitation  No other exam findings     Assessment & Plan:

## 2017-06-19 ENCOUNTER — Other Ambulatory Visit (INDEPENDENT_AMBULATORY_CARE_PROVIDER_SITE_OTHER): Payer: BLUE CROSS/BLUE SHIELD

## 2017-06-19 DIAGNOSIS — E039 Hypothyroidism, unspecified: Secondary | ICD-10-CM

## 2017-06-19 LAB — T4, FREE: Free T4: 1.6 ng/dL (ref 0.60–1.60)

## 2017-06-19 LAB — TSH: TSH: 0.74 u[IU]/mL (ref 0.35–4.50)

## 2017-06-23 ENCOUNTER — Other Ambulatory Visit: Payer: Self-pay | Admitting: Cardiovascular Disease

## 2017-06-23 NOTE — Telephone Encounter (Signed)
Patient has been seeing cardiologist at [place]. Will she still be following up with Dr Angelena Form? Please advise on refills. Thanks, MI

## 2017-06-23 NOTE — Telephone Encounter (Signed)
Pt cancelled last scheduled appointment with Dr. Angelena Form.  All refills should go to new cardiologist

## 2017-06-30 ENCOUNTER — Other Ambulatory Visit: Payer: Self-pay | Admitting: Cardiovascular Disease

## 2017-06-30 MED ORDER — CARVEDILOL 3.125 MG PO TABS: 3.1250 mg | ORAL_TABLET | Freq: Two times a day (BID) | ORAL | 0 refills | Status: DC

## 2017-06-30 MED ORDER — LOSARTAN POTASSIUM 50 MG PO TABS: 50.0000 mg | ORAL_TABLET | Freq: Every day | ORAL | 0 refills | Status: DC

## 2017-07-01 ENCOUNTER — Other Ambulatory Visit: Payer: Self-pay | Admitting: Internal Medicine

## 2017-07-06 HISTORY — PX: OTHER SURGICAL HISTORY: SHX169

## 2017-07-08 ENCOUNTER — Other Ambulatory Visit: Payer: Self-pay | Admitting: Cardiovascular Disease

## 2017-07-25 ENCOUNTER — Telehealth: Payer: Self-pay | Admitting: Internal Medicine

## 2017-07-25 MED ORDER — ALBUTEROL SULFATE HFA 108 (90 BASE) MCG/ACT IN AERS: INHALATION_SPRAY | RESPIRATORY_TRACT | 1 refills | Status: DC

## 2017-07-25 NOTE — Telephone Encounter (Signed)
Reviewed chart pt is up-to-date sent refills to [facility].../lmb  

## 2017-07-25 NOTE — Telephone Encounter (Signed)
Pt called requesting a refill on PROAIR HFA 108 (90 Base) MCG/ACT inhaler to [facility] on [address] in [place].

## 2017-07-29 ENCOUNTER — Other Ambulatory Visit: Payer: Self-pay | Admitting: Internal Medicine

## 2017-07-31 ENCOUNTER — Ambulatory Visit: Payer: BLUE CROSS/BLUE SHIELD | Attending: Family Medicine | Admitting: Physical Therapy

## 2017-07-31 ENCOUNTER — Encounter: Payer: Self-pay | Admitting: Physical Therapy

## 2017-07-31 DIAGNOSIS — M5442 Lumbago with sciatica, left side: Secondary | ICD-10-CM

## 2017-07-31 DIAGNOSIS — M62830 Muscle spasm of back: Secondary | ICD-10-CM | POA: Diagnosis present

## 2017-07-31 DIAGNOSIS — M6281 Muscle weakness (generalized): Secondary | ICD-10-CM | POA: Insufficient documentation

## 2017-07-31 DIAGNOSIS — R262 Difficulty in walking, not elsewhere classified: Secondary | ICD-10-CM | POA: Insufficient documentation

## 2017-07-31 NOTE — Therapy (Signed)
[address] [place], [place], [address] Phone: [phone]   Fax:  [phone]  Physical Therapy Evaluation  Patient Details  Name: Suzanne Hansen MRN: [id number] Date of Birth: 15-Feb-1956 Referring Provider: Saintclair Halsted  Encounter Date: 07/31/2017      PT End of Session - 07/31/17 1347    Visit Number 1   Date for PT Re-Evaluation 09/30/17   PT Start Time 1318   PT Stop Time 1410   PT Time Calculation (min) 52 min   Activity Tolerance Patient tolerated treatment well   Behavior During Therapy [facility] for tasks assessed/performed      Past Medical History:  Diagnosis Date  . ALLERGIC RHINITIS 05/09/2008  . Allergy   . ANXIETY 05/23/2007  . ARM PAIN, LEFT 10/23/2009  . Asthma   . ASTHMA 05/23/2007  . Cardiomyopathy ([place]) 09/08/2015  . CHF (congestive heart failure) ([place])   . CONTACT DERMATITIS 06/09/2009  . Diabetes mellitus   . ENDOMETRIOSIS NOS 05/23/2007  . FACIAL PAIN 06/02/2010  . FREQUENCY, URINARY 05/17/2010  . GLUCOSE INTOLERANCE 08/28/2009  . HERPES SIMPLEX, UNCOMPLICATED 03/23/4314  . Hyperlipidemia 08/28/2015  . HYPERTHYROIDISM 05/23/2007  . Hypoactive thyroid   . HYPOTHYROIDISM 05/09/2008  . Impaired glucose tolerance 01/28/2011  . INSOMNIA, HX OF 05/23/2007  . LATERAL EPICONDYLITIS, RIGHT 03/10/2009  . LIPOMA 10/22/2010  . NECK PAIN 11/10/2010  . OBESITY 05/23/2007  . Plantar fasciitis    Both feet  . SHOULDER PAIN, LEFT 05/17/2010  . SINUSITIS- ACUTE-NOS 11/03/2008  . SKIN LESION 11/10/2010  . Sleep apnea   . Unspecified Chest Pain 07/25/2008  . UNSPECIFIED URTICARIA 06/04/2009  . URI 08/28/2009  . UTI 04/29/2008  . VITAMIN D DEFICIENCY 08/28/2009  . Wheezing 07/12/2010    Past Surgical History:  Procedure Laterality Date  . COLONOSCOPY    . ENDOMETRIAL ABLATION    . LAMINECTOMY  1996  . LAPAROTOMY     exploratory  . POLYPECTOMY    . s/p endometrial ablation  12/06  . TUBAL LIGATION    .  UTERINE FIBROID SURGERY    . WISDOM TOOTH EXTRACTION      There were no vitals filed for this visit.       Subjective Assessment - 07/31/17 1321    Subjective Patient underwent a lumbar cyst removal on 07/06/17.  She is now recovering from the surgery, reports MD told her not to bend, lift or twist.     Pertinent History CHF, PAD   Limitations Lifting;Standing;Walking;House hold activities   Patient Stated Goals be ready to go to the weight loss program, lift grandchild, walk with her   Currently in Pain? Yes   Pain Score 3    Pain Location Back   Pain Orientation Lower   Pain Descriptors / Indicators Aching;Sore   Pain Type Acute pain;Surgical pain   Pain Radiating Towards reports left leg is weak, reports that she hsa to use hands to lift the left leg into the car   Pain Onset 1 to 4 weeks ago   Pain Frequency Constant   Aggravating Factors  walking, everything increases pain, pain up to 8/10   Pain Relieving Factors rest, pain meds  pain down to 3/10   Effect of Pain on Daily Activities difficulty with dressing            [facility] PT Assessment - 07/31/17 0001      Assessment   Medical Diagnosis low back pain after lumbar  cyst excision   Referring Provider Saintclair Halsted   Onset Date/Surgical Date 07/06/17   Prior Therapy for a few weeks     Precautions   Precaution Comments watch lifting, bending and twisting     Balance Screen   Has the patient fallen in the past 6 months No   Has the patient had a decrease in activity level because of a fear of falling?  No   Is the patient reluctant to leave their home because of a fear of falling?  No     Home Environment   Additional Comments has stairs at home, does her own housework     Prior Function   Level of Independence Independent   Vocation Full time employment   Vocation Requirements travel, stand and walk, behavior specialist   Leisure no exercise     AROM   Overall AROM Comments Lumbar ROM decreased 75%, very gaurded  slow, sore with motions, Left shoulder flexion 130, ER WNL's, IR 10 degrees     Strength   Overall Strength Comments right LE 4-/5, left LE 3+/5, some pain in the low back with MMT     Palpation   Palpation comment she is very tender and tight in the Lumbar area, into the buttocks and legs, she is very tight in the upper traps, left deltoid     Special Tests    Special Tests --  + impingement     Ambulation/Gait   Gait Comments Patient very slow with gait, shuffles feet, antalgic ont he left     Standardized Balance Assessment   Standardized Balance Assessment Timed Up and Go Test     Timed Up and Go Test   Normal TUG (seconds) 30            Objective measurements completed on examination: See above findings.          [facility] Adult PT Treatment/Exercise - 07/31/17 0001      Moist Heat Therapy   Number Minutes Moist Heat 15 Minutes   Moist Heat Location Lumbar Spine;Shoulder     Electrical Stimulation   Electrical Stimulation Location left shoulder area   Electrical Stimulation Action IFC   Electrical Stimulation Parameters sitting   Electrical Stimulation Goals Pain                PT Education - 07/31/17 1346    Education provided Yes   Education Details gentle McKenzie exercises   Person(s) Educated Patient   Methods Explanation;Demonstration   Comprehension Verbalized understanding          PT Short Term Goals - 07/31/17 1531      PT SHORT TERM GOAL #1   Title independent with initial HEP   Time 2   Period Weeks   Status New           PT Long Term Goals - 07/31/17 1531      PT LONG TERM GOAL #1   Title decrease back pain 50%   Time 8   Period Weeks   Status New     PT LONG TERM GOAL #2   Title decrease left shoulder pain 50%   Time 8   Period Weeks   Status New     PT LONG TERM GOAL #3   Title walk .5 miles without difficulty   Time 8   Period Weeks   Status New     PT LONG TERM GOAL #4   Title increase AROM of the  lumbar  spine 25%   Time 8   Period Weeks   Status New                Plan - 07/31/17 1354    Clinical Impression Statement Patient reports that she underwent a lumbar surgery to excise a large cyst on the spine.  She is walking with a shuffling pattern, limping on the left, very slow.  The left leg is very weak, she is c/o left shoulder pain as well, had a positive impingement test.   History and Personal Factors relevant to plan of care: PAD, CHF, recent back surgery and past back surgery   Clinical Presentation Evolving   Clinical Presentation due to: rescent back surgery, has knee and shoulder pain   Clinical Decision Making Moderate   Rehab Potential Good   PT Frequency 2x / week   PT Duration 8 weeks   PT Treatment/Interventions ADLs/Self Care Home Management;Cryotherapy;Electrical Stimulation;Iontophoresis 4mg /ml Dexamethasone;Moist Heat;Traction;Ultrasound;Therapeutic activities;Gait training;Stair training;Functional mobility training;Patient/family education;Neuromuscular re-education;Balance training;Therapeutic exercise;Manual techniques;Vasopneumatic Device   PT Next Visit Plan slowly begin exercises   Consulted and Agree with Plan of Care Patient      Patient will benefit from skilled therapeutic intervention in order to improve the following deficits and impairments:  Abnormal gait, Cardiopulmonary status limiting activity, Decreased activity tolerance, Decreased balance, Decreased mobility, Decreased strength, Increased edema, Improper body mechanics, Impaired flexibility, Pain, Increased muscle spasms, Difficulty walking, Decreased range of motion  Visit Diagnosis: Difficulty in walking, not elsewhere classified - Plan: PT plan of care cert/re-cert  Muscle spasm of back - Plan: PT plan of care cert/re-cert  Acute bilateral low back pain with left-sided sciatica - Plan: PT plan of care cert/re-cert  Muscle weakness (generalized) - Plan: PT plan of care  cert/re-cert      G-Codes - 91/47/82 1534    Functional Assessment Tool Used (Outpatient Only) foto 90% limitation   Functional Limitation Other PT primary   Other PT Primary Current Status (N5621) At least 80 percent but less than 100 percent impaired, limited or restricted   Other PT Primary Goal Status (H0865) At least 60 percent but less than 80 percent impaired, limited or restricted       Problem List Patient Active Problem List   Diagnosis Date Noted  . Lumbosacral radiculopathy at L4 06/14/2017  . Easy bruising 03/29/2017  . Left leg paresthesias 03/29/2017  . Muscle cramping 03/20/2017  . 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 Pain in lower jaw 11/10/2016  . Cough 11/06/2016  . Vaginitis 09/17/2016  . Left wrist pain 09/16/2016  . Hypersomnia with sleep apnea 02/15/2016  . 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 Extrinsic asthma 02/15/2016  . Ankle edema 02/15/2016  . Degenerative arthritis of knee, bilateral 01/21/2016  . Asthma with exacerbation 10/20/2015  . Acute sinus infection 09/29/2015  . Cardiomyopathy ([place]) 09/08/2015  . Bilateral calf pain 08/31/2015  . Hyperlipidemia 08/28/2015  . Left knee pain 08/28/2015  . Varicose veins with pain 08/28/2015  . Peripheral edema 08/13/2015  . Injection site reaction 06/02/2015  . PHN (postherpetic neuralgia) 05/29/2015  . Unspecified asthma, with exacerbation 07/01/2014  . Shingles 06/10/2014  . Skin nodule 11/27/2012  . Diabetes ([place]) 01/28/2011  . Encounter for preventative adult health care exam with abnormal findings 01/28/2011  . LIPOMA 10/22/2010  . VITAMIN D DEFICIENCY 08/28/2009  . Hypothyroidism 05/09/2008  . Allergic rhinitis 05/09/2008  . HERPES ZOSTER W/NERVOUS COMPLICATION NEC 78/46/9629  . HYPERTHYROIDISM 05/23/2007  . OBESITY 05/23/2007  . Anxiety  state 05/23/2007  . Asthma 05/23/2007  . ENDOMETRIOSIS NOS 05/23/2007  . INSOMNIA, HX OF 05/23/2007    Sumner Boast.,  PT 07/31/2017, 3:37 PM  [address] [place], [place], [address] Phone: [phone]   Fax:  [phone]  Name: Lyra Alaimo MRN: [id number] Date of Birth: [birth year]

## 2017-08-02 ENCOUNTER — Encounter: Payer: Self-pay | Admitting: Physical Therapy

## 2017-08-02 ENCOUNTER — Ambulatory Visit: Payer: BLUE CROSS/BLUE SHIELD | Admitting: Physical Therapy

## 2017-08-02 DIAGNOSIS — M5442 Lumbago with sciatica, left side: Secondary | ICD-10-CM

## 2017-08-02 DIAGNOSIS — R262 Difficulty in walking, not elsewhere classified: Secondary | ICD-10-CM

## 2017-08-02 DIAGNOSIS — M62830 Muscle spasm of back: Secondary | ICD-10-CM

## 2017-08-02 NOTE — Therapy (Signed)
[address] [place], [place], [address] Phone: [phone]   Fax:  [phone]  Physical Therapy Treatment  Patient Details  Name: Suzanne Hansen MRN: [id number] Date of Birth: Feb 28, 1956 Referring Provider: Saintclair Halsted  Encounter Date: 08/02/2017      PT End of Session - 08/02/17 1516    Visit Number 2   Date for PT Re-Evaluation 09/30/17   PT Start Time 1430   PT Stop Time 1528   PT Time Calculation (min) 58 min   Activity Tolerance Patient tolerated treatment well   Behavior During Therapy [facility] for tasks assessed/performed      Past Medical History:  Diagnosis Date  . ALLERGIC RHINITIS 05/09/2008  . Allergy   . ANXIETY 05/23/2007  . ARM PAIN, LEFT 10/23/2009  . Asthma   . ASTHMA 05/23/2007  . Cardiomyopathy ([place]) 09/08/2015  . CHF (congestive heart failure) ([place])   . CONTACT DERMATITIS 06/09/2009  . Diabetes mellitus   . ENDOMETRIOSIS NOS 05/23/2007  . FACIAL PAIN 06/02/2010  . FREQUENCY, URINARY 05/17/2010  . GLUCOSE INTOLERANCE 08/28/2009  . HERPES SIMPLEX, UNCOMPLICATED 3/41/9622  . Hyperlipidemia 08/28/2015  . HYPERTHYROIDISM 05/23/2007  . Hypoactive thyroid   . HYPOTHYROIDISM 05/09/2008  . Impaired glucose tolerance 01/28/2011  . INSOMNIA, HX OF 05/23/2007  . LATERAL EPICONDYLITIS, RIGHT 03/10/2009  . LIPOMA 10/22/2010  . NECK PAIN 11/10/2010  . OBESITY 05/23/2007  . Plantar fasciitis    Both feet  . SHOULDER PAIN, LEFT 05/17/2010  . SINUSITIS- ACUTE-NOS 11/03/2008  . SKIN LESION 11/10/2010  . Sleep apnea   . Unspecified Chest Pain 07/25/2008  . UNSPECIFIED URTICARIA 06/04/2009  . URI 08/28/2009  . UTI 04/29/2008  . VITAMIN D DEFICIENCY 08/28/2009  . Wheezing 07/12/2010    Past Surgical History:  Procedure Laterality Date  . COLONOSCOPY    . ENDOMETRIAL ABLATION    . LAMINECTOMY  1996  . LAPAROTOMY     exploratory  . POLYPECTOMY    . s/p endometrial ablation  12/06  . TUBAL LIGATION    .  UTERINE FIBROID SURGERY    . WISDOM TOOTH EXTRACTION      There were no vitals filed for this visit.      Subjective Assessment - 08/02/17 1431    Subjective "Better, those stretches helped me"   Currently in Pain? Yes   Pain Score 5    Pain Location Back                         [facility] Adult PT Treatment/Exercise - 08/02/17 0001      Exercises   Exercises Lumbar     Lumbar Exercises: Seated   Other Seated Lumbar Exercises shoulder ext red tband 2x10   Other Seated Lumbar Exercises Seated rows red tband 2x10      Knee/Hip Exercises: Aerobic   Nustep level 3 x 6 minutes     Knee/Hip Exercises: Seated   Long Arc Quad Both;2 sets;10 reps   Long Arc Quad Weight 2 lbs.   Other Seated Knee/Hip Exercises sit fit pelvic ROM 15 times each   Marching Limitations 2x10   Marching Weights 2 lbs.   Hamstring Curl 2 sets;10 reps   Hamstring Limitations red tband    Sit to Sand 5 reps;without UE support;3 sets     Moist Heat Therapy   Number Minutes Moist Heat 15 Minutes   Moist Heat Location Lumbar Spine;Shoulder  Acupuncturist Location L shoulder, back   Chartered certified accountant Pre Mod   Electrical Stimulation Parameters sitting   Electrical Stimulation Goals Pain                  PT Short Term Goals - 07/31/17 1531      PT SHORT TERM GOAL #1   Title independent with initial HEP   Time 2   Period Weeks   Status New           PT Long Term Goals - 07/31/17 1531      PT LONG TERM GOAL #1   Title decrease back pain 50%   Time 8   Period Weeks   Status New     PT LONG TERM GOAL #2   Title decrease left shoulder pain 50%   Time 8   Period Weeks   Status New     PT LONG TERM GOAL #3   Title walk .5 miles without difficulty   Time 8   Period Weeks   Status New     PT LONG TERM GOAL #4   Title increase AROM of the lumbar spine 25%   Time 8   Period Weeks   Status New                Plan - 08/02/17 1516    Clinical Impression Statement Pt tolerated an initial progressing to exercises well, reports some L shoulder pain with seated extensions. Pt reports that she could feel it in her back when she did LAQ and HS  curls with the RLE.   Rehab Potential Good   PT Frequency 2x / week   PT Duration 8 weeks   PT Treatment/Interventions ADLs/Self Care Home Management;Cryotherapy;Electrical Stimulation;Iontophoresis 4mg /ml Dexamethasone;Moist Heat;Traction;Ultrasound;Therapeutic activities;Gait training;Stair training;Functional mobility training;Patient/family education;Neuromuscular re-education;Balance training;Therapeutic exercise;Manual techniques;Vasopneumatic Device   PT Next Visit Plan slowly begin exercises      Patient will benefit from skilled therapeutic intervention in order to improve the following deficits and impairments:  Abnormal gait, Cardiopulmonary status limiting activity, Decreased activity tolerance, Decreased balance, Decreased mobility, Decreased strength, Increased edema, Improper body mechanics, Impaired flexibility, Pain, Increased muscle spasms, Difficulty walking, Decreased range of motion  Visit Diagnosis: Muscle spasm of back  Difficulty in walking, not elsewhere classified  Acute bilateral low back pain with left-sided sciatica     Problem List Patient Active Problem List   Diagnosis Date Noted  . Lumbosacral radiculopathy at L4 06/14/2017  . Easy bruising 03/29/2017  . Left leg paresthesias 03/29/2017  . Muscle cramping 03/20/2017  . 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 Pain in lower jaw 11/10/2016  . Cough 11/06/2016  . Vaginitis 09/17/2016  . Left wrist pain 09/16/2016  . Hypersomnia with sleep apnea 02/15/2016  . 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 Extrinsic asthma 02/15/2016  . Ankle edema 02/15/2016  . Degenerative arthritis of knee, bilateral 01/21/2016  . Asthma with exacerbation 10/20/2015  . Acute sinus  infection 09/29/2015  . Cardiomyopathy ([place]) 09/08/2015  . Bilateral calf pain 08/31/2015  . Hyperlipidemia 08/28/2015  . Left knee pain 08/28/2015  . Varicose veins with pain 08/28/2015  . Peripheral edema 08/13/2015  . Injection site reaction 06/02/2015  . PHN (postherpetic neuralgia) 05/29/2015  . Unspecified asthma, with exacerbation 07/01/2014  . Shingles 06/10/2014  . Skin nodule 11/27/2012  . Diabetes ([place]) 01/28/2011  . Encounter for preventative adult health care exam with abnormal findings 01/28/2011  . LIPOMA 10/22/2010  . VITAMIN  D DEFICIENCY 08/28/2009  . Hypothyroidism 05/09/2008  . Allergic rhinitis 05/09/2008  . HERPES ZOSTER W/NERVOUS COMPLICATION NEC 94/04/6807  . HYPERTHYROIDISM 05/23/2007  . OBESITY 05/23/2007  . Anxiety state 05/23/2007  . Asthma 05/23/2007  . ENDOMETRIOSIS NOS 05/23/2007  . INSOMNIA, HX OF 05/23/2007    Scot Jun, PTA 08/02/2017, 3:19 PM  [address] [place], [place], [address] Phone: [phone]   Fax:  [phone]  Name: Suzanne Hansen MRN: [id number] Date of Birth: May 19, 1956

## 2017-08-08 ENCOUNTER — Encounter: Payer: Self-pay | Admitting: Physical Therapy

## 2017-08-08 ENCOUNTER — Ambulatory Visit: Payer: BLUE CROSS/BLUE SHIELD | Admitting: Physical Therapy

## 2017-08-08 DIAGNOSIS — R262 Difficulty in walking, not elsewhere classified: Secondary | ICD-10-CM

## 2017-08-08 DIAGNOSIS — M62830 Muscle spasm of back: Secondary | ICD-10-CM

## 2017-08-08 DIAGNOSIS — M6281 Muscle weakness (generalized): Secondary | ICD-10-CM

## 2017-08-08 DIAGNOSIS — M5442 Lumbago with sciatica, left side: Secondary | ICD-10-CM

## 2017-08-08 NOTE — Therapy (Signed)
[place] [place], [place], [address] Phone: [phone]   Fax:  [phone]  Physical Therapy Treatment  Patient Details  Name: Suzanne Hansen MRN: [id number] Date of Birth: May 20, 1956 Referring Provider: Saintclair Halsted  Encounter Date: 08/08/2017      PT End of Session - 08/08/17 1425    Visit Number 3   Date for PT Re-Evaluation 09/30/17   PT Start Time 1400   PT Stop Time 1500   PT Time Calculation (min) 60 min      Past Medical History:  Diagnosis Date  . ALLERGIC RHINITIS 05/09/2008  . Allergy   . ANXIETY 05/23/2007  . ARM PAIN, LEFT 10/23/2009  . Asthma   . ASTHMA 05/23/2007  . Cardiomyopathy ([place]) 09/08/2015  . CHF (congestive heart failure) ([place])   . CONTACT DERMATITIS 06/09/2009  . Diabetes mellitus   . ENDOMETRIOSIS NOS 05/23/2007  . FACIAL PAIN 06/02/2010  . FREQUENCY, URINARY 05/17/2010  . GLUCOSE INTOLERANCE 08/28/2009  . HERPES SIMPLEX, UNCOMPLICATED 5/70/1779  . Hyperlipidemia 08/28/2015  . HYPERTHYROIDISM 05/23/2007  . Hypoactive thyroid   . HYPOTHYROIDISM 05/09/2008  . Impaired glucose tolerance 01/28/2011  . INSOMNIA, HX OF 05/23/2007  . LATERAL EPICONDYLITIS, RIGHT 03/10/2009  . LIPOMA 10/22/2010  . NECK PAIN 11/10/2010  . OBESITY 05/23/2007  . Plantar fasciitis    Both feet  . SHOULDER PAIN, LEFT 05/17/2010  . SINUSITIS- ACUTE-NOS 11/03/2008  . SKIN LESION 11/10/2010  . Sleep apnea   . Unspecified Chest Pain 07/25/2008  . UNSPECIFIED URTICARIA 06/04/2009  . URI 08/28/2009  . UTI 04/29/2008  . VITAMIN D DEFICIENCY 08/28/2009  . Wheezing 07/12/2010    Past Surgical History:  Procedure Laterality Date  . COLONOSCOPY    . ENDOMETRIAL ABLATION    . LAMINECTOMY  1996  . LAPAROTOMY     exploratory  . POLYPECTOMY    . s/p endometrial ablation  12/06  . TUBAL LIGATION    . UTERINE FIBROID SURGERY    . WISDOM TOOTH EXTRACTION      There were no vitals filed for this visit.       Subjective Assessment - 08/08/17 1400    Subjective last session was too much   Currently in Pain? Yes   Pain Score 4    Pain Location Back                         [facility] Adult PT Treatment/Exercise - 08/08/17 0001      Knee/Hip Exercises: Aerobic   Recumbent Bike 5 min   Nustep L 4 6 min     Knee/Hip Exercises: Standing   Heel Raises 15 reps  black bar   Hip Abduction Stengthening;Both;10 reps   Hip Extension Stengthening;Both;10 reps     Knee/Hip Exercises: Seated   Ball Squeeze 15 3 sec hold   Clamshell with TheraBand Red   Knee/Hip Flexion red tband hip flex   Other Seated Knee/Hip Exercises scap stab red tband     Moist Heat Therapy   Number Minutes Moist Heat 15 Minutes   Moist Heat Location Lumbar Spine;Shoulder     Electrical Stimulation   Electrical Stimulation Location L shoulder, back   Electrical Stimulation Action premod   Electrical Stimulation Parameters sitting   Electrical Stimulation Goals Pain                  PT Short Term Goals - 08/08/17 1427  PT SHORT TERM GOAL #1   Title independent with initial HEP   Status Achieved           PT Long Term Goals - 07/31/17 1531      PT LONG TERM GOAL #1   Title decrease back pain 50%   Time 8   Period Weeks   Status New     PT LONG TERM GOAL #2   Title decrease left shoulder pain 50%   Time 8   Period Weeks   Status New     PT LONG TERM GOAL #3   Title walk .5 miles without difficulty   Time 8   Period Weeks   Status New     PT LONG TERM GOAL #4   Title increase AROM of the lumbar spine 25%   Time 8   Period Weeks   Status New               Plan - 08/08/17 1426    Clinical Impression Statement decreased wt with ex per pt request. cuing with ex. pt reports func limitations in Left shld buy estim helping   PT Treatment/Interventions ADLs/Self Care Home Management;Cryotherapy;Electrical Stimulation;Iontophoresis 4mg /ml Dexamethasone;Moist  Heat;Traction;Ultrasound;Therapeutic activities;Gait training;Stair training;Functional mobility training;Patient/family education;Neuromuscular re-education;Balance training;Therapeutic exercise;Manual techniques;Vasopneumatic Device   PT Next Visit Plan slowly begin exercises      Patient will benefit from skilled therapeutic intervention in order to improve the following deficits and impairments:  Abnormal gait, Cardiopulmonary status limiting activity, Decreased activity tolerance, Decreased balance, Decreased mobility, Decreased strength, Increased edema, Improper body mechanics, Impaired flexibility, Pain, Increased muscle spasms, Difficulty walking, Decreased range of motion  Visit Diagnosis: Muscle spasm of back  Difficulty in walking, not elsewhere classified  Acute bilateral low back pain with left-sided sciatica  Muscle weakness (generalized)     Problem List Patient Active Problem List   Diagnosis Date Noted  . Lumbosacral radiculopathy at L4 06/14/2017  . Easy bruising 03/29/2017  . Left leg paresthesias 03/29/2017  . Muscle cramping 03/20/2017  . 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 Pain in lower jaw 11/10/2016  . Cough 11/06/2016  . Vaginitis 09/17/2016  . Left wrist pain 09/16/2016  . Hypersomnia with sleep apnea 02/15/2016  . 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 Extrinsic asthma 02/15/2016  . Ankle edema 02/15/2016  . Degenerative arthritis of knee, bilateral 01/21/2016  . Asthma with exacerbation 10/20/2015  . Acute sinus infection 09/29/2015  . Cardiomyopathy ([place]) 09/08/2015  . Bilateral calf pain 08/31/2015  . Hyperlipidemia 08/28/2015  . Left knee pain 08/28/2015  . Varicose veins with pain 08/28/2015  . Peripheral edema 08/13/2015  . Injection site reaction 06/02/2015  . PHN (postherpetic neuralgia) 05/29/2015  . Unspecified asthma, with exacerbation 07/01/2014  . Shingles 06/10/2014  . Skin nodule 11/27/2012  . Diabetes ([place])  01/28/2011  . Encounter for preventative adult health care exam with abnormal findings 01/28/2011  . LIPOMA 10/22/2010  . VITAMIN D DEFICIENCY 08/28/2009  . Hypothyroidism 05/09/2008  . Allergic rhinitis 05/09/2008  . HERPES ZOSTER W/NERVOUS COMPLICATION NEC 36/64/4034  . HYPERTHYROIDISM 05/23/2007  . OBESITY 05/23/2007  . Anxiety state 05/23/2007  . Asthma 05/23/2007  . ENDOMETRIOSIS NOS 05/23/2007  . INSOMNIA, HX OF 05/23/2007    Jaysha Lasure,ANGIE PTA 08/08/2017, 2:27 PM  [place] [address] [place], [place], [address] Phone: [phone]   Fax:  [phone]  Name: Suzanne Hansen MRN: [id number] Date of Birth: 04-16-1956

## 2017-08-10 ENCOUNTER — Ambulatory Visit: Payer: BLUE CROSS/BLUE SHIELD | Attending: Family Medicine | Admitting: Physical Therapy

## 2017-08-10 ENCOUNTER — Encounter: Payer: Self-pay | Admitting: Physical Therapy

## 2017-08-10 DIAGNOSIS — M62830 Muscle spasm of back: Secondary | ICD-10-CM | POA: Diagnosis not present

## 2017-08-10 DIAGNOSIS — M5442 Lumbago with sciatica, left side: Secondary | ICD-10-CM

## 2017-08-10 DIAGNOSIS — R262 Difficulty in walking, not elsewhere classified: Secondary | ICD-10-CM | POA: Diagnosis present

## 2017-08-10 DIAGNOSIS — M6281 Muscle weakness (generalized): Secondary | ICD-10-CM | POA: Diagnosis present

## 2017-08-10 NOTE — Therapy (Signed)
[place] [address] [place], [place], [address] Phone: [phone]   Fax:  [phone]  Physical Therapy Treatment  Patient Details  Name: Suzanne Hansen MRN: [id number] Date of Birth: 1956-05-03 Referring Provider: Saintclair Halsted  Encounter Date: 08/10/2017      PT End of Session - 08/10/17 1421    Visit Number 4   Date for PT Re-Evaluation 09/30/17   PT Start Time 1350   PT Stop Time 1751   PT Time Calculation (min) 55 min      Past Medical History:  Diagnosis Date  . ALLERGIC RHINITIS 05/09/2008  . Allergy   . ANXIETY 05/23/2007  . ARM PAIN, LEFT 10/23/2009  . Asthma   . ASTHMA 05/23/2007  . Cardiomyopathy ([place]) 09/08/2015  . CHF (congestive heart failure) ([place])   . CONTACT DERMATITIS 06/09/2009  . Diabetes mellitus   . ENDOMETRIOSIS NOS 05/23/2007  . FACIAL PAIN 06/02/2010  . FREQUENCY, URINARY 05/17/2010  . GLUCOSE INTOLERANCE 08/28/2009  . HERPES SIMPLEX, UNCOMPLICATED 0/25/8527  . Hyperlipidemia 08/28/2015  . HYPERTHYROIDISM 05/23/2007  . Hypoactive thyroid   . HYPOTHYROIDISM 05/09/2008  . Impaired glucose tolerance 01/28/2011  . INSOMNIA, HX OF 05/23/2007  . LATERAL EPICONDYLITIS, RIGHT 03/10/2009  . LIPOMA 10/22/2010  . NECK PAIN 11/10/2010  . OBESITY 05/23/2007  . Plantar fasciitis    Both feet  . SHOULDER PAIN, LEFT 05/17/2010  . SINUSITIS- ACUTE-NOS 11/03/2008  . SKIN LESION 11/10/2010  . Sleep apnea   . Unspecified Chest Pain 07/25/2008  . UNSPECIFIED URTICARIA 06/04/2009  . URI 08/28/2009  . UTI 04/29/2008  . VITAMIN D DEFICIENCY 08/28/2009  . Wheezing 07/12/2010    Past Surgical History:  Procedure Laterality Date  . COLONOSCOPY    . ENDOMETRIAL ABLATION    . LAMINECTOMY  1996  . LAPAROTOMY     exploratory  . POLYPECTOMY    . s/p endometrial ablation  12/06  . TUBAL LIGATION    . UTERINE FIBROID SURGERY    . WISDOM TOOTH EXTRACTION      There were no vitals filed for this visit.       Subjective Assessment - 08/10/17 1351    Subjective felt better after last session   Currently in Pain? Yes   Pain Score 2    Pain Location Back                         [facility] Adult PT Treatment/Exercise - 08/10/17 0001      Lumbar Exercises: Aerobic   UBE (Upper Arm Bike) 73fd/2back L 3  standing     Lumbar Exercises: Machines for Strengthening   Cybex Lumbar Extension red tband 2 sets 10  ab pull red tband 2 sets 10     Lumbar Exercises: Standing   Other Standing Lumbar Exercises wt ball obl 10 each way   Other Standing Lumbar Exercises scap stab 12 each way     Lumbar Exercises: Seated   Other Seated Lumbar Exercises ab crunch with ball 15 times     Knee/Hip Exercises: Aerobic   Nustep L 4 6 min     Knee/Hip Exercises: Machines for Strengthening   Cybex Leg Press attempted but too painful     Knee/Hip Exercises: Standing   Lateral Step Up Both;1 set;10 reps;Hand Hold: 2;Step Height: 4"  opp leg abd   Forward Step Up Both;10 reps  opp leg ext     Knee/Hip Exercises: Seated  Ball Squeeze 15 3 sec hold   Clamshell with TheraBand Red   Knee/Hip Flexion red tband hip flex     Moist Heat Therapy   Number Minutes Moist Heat 15 Minutes   Moist Heat Location Lumbar Spine;Shoulder     Electrical Stimulation   Electrical Stimulation Location L shoulder, back   Electrical Stimulation Action premod   Electrical Stimulation Parameters sitting   Electrical Stimulation Goals Pain                  PT Short Term Goals - 08/08/17 1427      PT SHORT TERM GOAL #1   Title independent with initial HEP   Status Achieved           PT Long Term Goals - 07/31/17 1531      PT LONG TERM GOAL #1   Title decrease back pain 50%   Time 8   Period Weeks   Status New     PT LONG TERM GOAL #2   Title decrease left shoulder pain 50%   Time 8   Period Weeks   Status New     PT LONG TERM GOAL #3   Title walk .5 miles without difficulty   Time 8    Period Weeks   Status New     PT LONG TERM GOAL #4   Title increase AROM of the lumbar spine 25%   Time 8   Period Weeks   Status New               Plan - 08/10/17 1422    Clinical Impression Statement STG met. Decreased about to be supine d/t pain. Guarded with ex d/t pain,cuing needed   PT Treatment/Interventions ADLs/Self Care Home Management;Cryotherapy;Electrical Stimulation;Iontophoresis '4mg'$ /ml Dexamethasone;Moist Heat;Traction;Ultrasound;Therapeutic activities;Gait training;Stair training;Functional mobility training;Patient/family education;Neuromuscular re-education;Balance training;Therapeutic exercise;Manual techniques;Vasopneumatic Device   PT Next Visit Plan progress ex slowly      Patient will benefit from skilled therapeutic intervention in order to improve the following deficits and impairments:  Abnormal gait, Cardiopulmonary status limiting activity, Decreased activity tolerance, Decreased balance, Decreased mobility, Decreased strength, Increased edema, Improper body mechanics, Impaired flexibility, Pain, Increased muscle spasms, Difficulty walking, Decreased range of motion  Visit Diagnosis: Muscle spasm of back  Difficulty in walking, not elsewhere classified  Acute bilateral low back pain with left-sided sciatica     Problem List Patient Active Problem List   Diagnosis Date Noted  . Lumbosacral radiculopathy at L4 06/14/2017  . Easy bruising 03/29/2017  . Left leg paresthesias 03/29/2017  . Muscle cramping 03/20/2017  . 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 Pain in lower jaw 11/10/2016  . Cough 11/06/2016  . Vaginitis 09/17/2016  . Left wrist pain 09/16/2016  . Hypersomnia with sleep apnea 02/15/2016  . 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 Extrinsic asthma 02/15/2016  . Ankle edema 02/15/2016  . Degenerative arthritis of knee, bilateral 01/21/2016  . Asthma with exacerbation 10/20/2015  . Acute sinus infection 09/29/2015  .  Cardiomyopathy ([place]) 09/08/2015  . Bilateral calf pain 08/31/2015  . Hyperlipidemia 08/28/2015  . Left knee pain 08/28/2015  . Varicose veins with pain 08/28/2015  . Peripheral edema 08/13/2015  . Injection site reaction 06/02/2015  . PHN (postherpetic neuralgia) 05/29/2015  . Unspecified asthma, with exacerbation 07/01/2014  . Shingles 06/10/2014  . Skin nodule 11/27/2012  . Diabetes ([place]) 01/28/2011  . Encounter for preventative adult health care exam with abnormal findings 01/28/2011  . LIPOMA 10/22/2010  . VITAMIN D DEFICIENCY 08/28/2009  .  Hypothyroidism 05/09/2008  . Allergic rhinitis 05/09/2008  . HERPES ZOSTER W/NERVOUS COMPLICATION NEC 20/72/1828  . HYPERTHYROIDISM 05/23/2007  . OBESITY 05/23/2007  . Anxiety state 05/23/2007  . Asthma 05/23/2007  . ENDOMETRIOSIS NOS 05/23/2007  . INSOMNIA, HX OF 05/23/2007    Rodric Punch,ANGIE PTA 08/10/2017, 2:23 PM  [place] [address] [place], [place], [address] Phone: [phone]   Fax:  [phone]  Name: Tiaira Arambula MRN: [id number] Date of Birth: 28-Jun-1956

## 2017-08-15 ENCOUNTER — Ambulatory Visit: Payer: BLUE CROSS/BLUE SHIELD | Admitting: Physical Therapy

## 2017-08-15 ENCOUNTER — Encounter: Payer: Self-pay | Admitting: Physical Therapy

## 2017-08-15 DIAGNOSIS — M62830 Muscle spasm of back: Secondary | ICD-10-CM | POA: Diagnosis not present

## 2017-08-15 DIAGNOSIS — M5442 Lumbago with sciatica, left side: Secondary | ICD-10-CM

## 2017-08-15 DIAGNOSIS — M6281 Muscle weakness (generalized): Secondary | ICD-10-CM

## 2017-08-15 DIAGNOSIS — R262 Difficulty in walking, not elsewhere classified: Secondary | ICD-10-CM

## 2017-08-15 NOTE — Therapy (Signed)
[facility] [address] [place], [place], [address] Phone: [phone]   Fax:  [phone]  Physical Therapy Treatment  Patient Details  Name: Suzanne Hansen MRN: [id number] Date of Birth: 24-Jan-1956 Referring Provider: Saintclair Halsted   Encounter Date: 08/15/2017  PT End of Session - 08/15/17 1306    Visit Number  5    Date for PT Re-Evaluation  09/30/17    PT Start Time  1230    PT Stop Time  1325    PT Time Calculation (min)  55 min       Past Medical History:  Diagnosis Date  . ALLERGIC RHINITIS 05/09/2008  . Allergy   . ANXIETY 05/23/2007  . ARM PAIN, LEFT 10/23/2009  . Asthma   . ASTHMA 05/23/2007  . Cardiomyopathy ([place]) 09/08/2015  . CHF (congestive heart failure) ([place])   . CONTACT DERMATITIS 06/09/2009  . Diabetes mellitus   . ENDOMETRIOSIS NOS 05/23/2007  . FACIAL PAIN 06/02/2010  . FREQUENCY, URINARY 05/17/2010  . GLUCOSE INTOLERANCE 08/28/2009  . HERPES SIMPLEX, UNCOMPLICATED 6/75/9163  . Hyperlipidemia 08/28/2015  . HYPERTHYROIDISM 05/23/2007  . Hypoactive thyroid   . HYPOTHYROIDISM 05/09/2008  . Impaired glucose tolerance 01/28/2011  . INSOMNIA, HX OF 05/23/2007  . LATERAL EPICONDYLITIS, RIGHT 03/10/2009  . LIPOMA 10/22/2010  . NECK PAIN 11/10/2010  . OBESITY 05/23/2007  . Plantar fasciitis    Both feet  . SHOULDER PAIN, LEFT 05/17/2010  . SINUSITIS- ACUTE-NOS 11/03/2008  . SKIN LESION 11/10/2010  . Sleep apnea   . Unspecified Chest Pain 07/25/2008  . UNSPECIFIED URTICARIA 06/04/2009  . URI 08/28/2009  . UTI 04/29/2008  . VITAMIN D DEFICIENCY 08/28/2009  . Wheezing 07/12/2010    Past Surgical History:  Procedure Laterality Date  . COLONOSCOPY    . ENDOMETRIAL ABLATION    . LAMINECTOMY  1996  . LAPAROTOMY     exploratory  . POLYPECTOMY    . s/p endometrial ablation  12/06  . TUBAL LIGATION    . UTERINE FIBROID SURGERY    . WISDOM TOOTH EXTRACTION      There were no vitals filed for this  visit.  Subjective Assessment - 08/15/17 1235    Subjective  trying to walk more so alittle more sore/pain    Currently in Pain?  Yes    Pain Score  4     Pain Location  Back                      [facility] Adult PT Treatment/Exercise - 08/15/17 0001      Lumbar Exercises: Machines for Strengthening   Cybex Lumbar Extension  red tband 2 sets 10 flexion 2 sets 10   flexion 2 sets 10   Other Lumbar Machine Exercise  lat pull 15# 10 times      Lumbar Exercises: Standing   Other Standing Lumbar Exercises  wt ball obl 10 each way wt ball OH ext 10 times   wt ball OH ext 10 times     Knee/Hip Exercises: Aerobic   Nustep  L 4 6 min      Knee/Hip Exercises: Seated   Other Seated Knee/Hip Exercises  sit fit 3# LE ther ex,scap stab 10 times each      Moist Heat Therapy   Number Minutes Moist Heat  15 Minutes    Moist Heat Location  Lumbar Spine;Shoulder      Electrical Stimulation   Electrical Stimulation Location  L  shoulder, back    Electrical Stimulation Action  premod    Electrical Stimulation Parameters  sitting    Electrical Stimulation Goals  Pain               PT Short Term Goals - 08/08/17 1427      PT SHORT TERM GOAL #1   Title  independent with initial HEP    Status  Achieved        PT Long Term Goals - 07/31/17 1531      PT LONG TERM GOAL #1   Title  decrease back pain 50%    Time  8    Period  Weeks    Status  New      PT LONG TERM GOAL #2   Title  decrease left shoulder pain 50%    Time  8    Period  Weeks    Status  New      PT LONG TERM GOAL #3   Title  walk .5 miles without difficulty    Time  8    Period  Weeks    Status  New      PT LONG TERM GOAL #4   Title  increase AROM of the lumbar spine 25%    Time  8    Period  Weeks    Status  New            Plan - 08/15/17 1306    Clinical Impression Statement  pain limited today with cuing and rest needed with ex    PT Treatment/Interventions  ADLs/Self Care Home  Management;Cryotherapy;Electrical Stimulation;Iontophoresis 4mg /ml Dexamethasone;Moist Heat;Traction;Ultrasound;Therapeutic activities;Gait training;Stair training;Functional mobility training;Patient/family education;Neuromuscular re-education;Balance training;Therapeutic exercise;Manual techniques;Vasopneumatic Device    PT Next Visit Plan  progress ex slowly       Patient will benefit from skilled therapeutic intervention in order to improve the following deficits and impairments:  Abnormal gait, Cardiopulmonary status limiting activity, Decreased activity tolerance, Decreased balance, Decreased mobility, Decreased strength, Increased edema, Improper body mechanics, Impaired flexibility, Pain, Increased muscle spasms, Difficulty walking, Decreased range of motion  Visit Diagnosis: Muscle spasm of back  Difficulty in walking, not elsewhere classified  Acute bilateral low back pain with left-sided sciatica  Muscle weakness (generalized)     Problem List Patient Active Problem List   Diagnosis Date Noted  . Lumbosacral radiculopathy at L4 06/14/2017  . Easy bruising 03/29/2017  . Left leg paresthesias 03/29/2017  . Muscle cramping 03/20/2017  . 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 Pain in lower jaw 11/10/2016  . Cough 11/06/2016  . Vaginitis 09/17/2016  . Left wrist pain 09/16/2016  . Hypersomnia with sleep apnea 02/15/2016  . 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 Extrinsic asthma 02/15/2016  . Ankle edema 02/15/2016  . Degenerative arthritis of knee, bilateral 01/21/2016  . Asthma with exacerbation 10/20/2015  . Acute sinus infection 09/29/2015  . Cardiomyopathy ([place]) 09/08/2015  . Bilateral calf pain 08/31/2015  . Hyperlipidemia 08/28/2015  . Left knee pain 08/28/2015  . Varicose veins with pain 08/28/2015  . Peripheral edema 08/13/2015  . Injection site reaction 06/02/2015  . PHN (postherpetic neuralgia) 05/29/2015  . Unspecified asthma, with exacerbation  07/01/2014  . Shingles 06/10/2014  . Skin nodule 11/27/2012  . Diabetes ([place]) 01/28/2011  . Encounter for preventative adult health care exam with abnormal findings 01/28/2011  . LIPOMA 10/22/2010  . VITAMIN D DEFICIENCY 08/28/2009  . Hypothyroidism 05/09/2008  . Allergic rhinitis 05/09/2008  . HERPES ZOSTER W/NERVOUS COMPLICATION NEC 33/00/7622  .  HYPERTHYROIDISM 05/23/2007  . OBESITY 05/23/2007  . Anxiety state 05/23/2007  . Asthma 05/23/2007  . ENDOMETRIOSIS NOS 05/23/2007  . INSOMNIA, HX OF 05/23/2007    Zayne Draheim,ANGIE PTA 08/15/2017, 1:08 PM  [place] [address] [place], [place], [address] Phone: [phone]   Fax:  [phone]  Name: Suzanne Hansen MRN: [id number] Date of Birth: 06-Mar-1956

## 2017-08-17 ENCOUNTER — Ambulatory Visit: Payer: BLUE CROSS/BLUE SHIELD | Admitting: Physical Therapy

## 2017-08-17 DIAGNOSIS — M62830 Muscle spasm of back: Secondary | ICD-10-CM | POA: Diagnosis not present

## 2017-08-17 DIAGNOSIS — M5442 Lumbago with sciatica, left side: Secondary | ICD-10-CM

## 2017-08-17 DIAGNOSIS — R262 Difficulty in walking, not elsewhere classified: Secondary | ICD-10-CM

## 2017-08-17 DIAGNOSIS — M6281 Muscle weakness (generalized): Secondary | ICD-10-CM

## 2017-08-17 NOTE — Therapy (Signed)
[address] [place], [place], [address] Phone: [phone]   Fax:  [phone]  Physical Therapy Treatment  Patient Details  Name: Suzanne Hansen MRN: [id number] Date of Birth: Aug 02, 1956 Referring Provider: Saintclair Halsted   Encounter Date: 08/17/2017  PT End of Session - 08/17/17 1217    Visit Number  6    Date for PT Re-Evaluation  09/30/17    PT Start Time  3235    PT Stop Time  1238    PT Time Calculation (min)  53 min       Past Medical History:  Diagnosis Date  . ALLERGIC RHINITIS 05/09/2008  . Allergy   . ANXIETY 05/23/2007  . ARM PAIN, LEFT 10/23/2009  . Asthma   . ASTHMA 05/23/2007  . Cardiomyopathy ([place]) 09/08/2015  . CHF (congestive heart failure) ([place])   . CONTACT DERMATITIS 06/09/2009  . Diabetes mellitus   . ENDOMETRIOSIS NOS 05/23/2007  . FACIAL PAIN 06/02/2010  . FREQUENCY, URINARY 05/17/2010  . GLUCOSE INTOLERANCE 08/28/2009  . HERPES SIMPLEX, UNCOMPLICATED 5/73/2202  . Hyperlipidemia 08/28/2015  . HYPERTHYROIDISM 05/23/2007  . Hypoactive thyroid   . HYPOTHYROIDISM 05/09/2008  . Impaired glucose tolerance 01/28/2011  . INSOMNIA, HX OF 05/23/2007  . LATERAL EPICONDYLITIS, RIGHT 03/10/2009  . LIPOMA 10/22/2010  . NECK PAIN 11/10/2010  . OBESITY 05/23/2007  . Plantar fasciitis    Both feet  . SHOULDER PAIN, LEFT 05/17/2010  . SINUSITIS- ACUTE-NOS 11/03/2008  . SKIN LESION 11/10/2010  . Sleep apnea   . Unspecified Chest Pain 07/25/2008  . UNSPECIFIED URTICARIA 06/04/2009  . URI 08/28/2009  . UTI 04/29/2008  . VITAMIN D DEFICIENCY 08/28/2009  . Wheezing 07/12/2010    Past Surgical History:  Procedure Laterality Date  . COLONOSCOPY    . ENDOMETRIAL ABLATION    . LAMINECTOMY  1996  . LAPAROTOMY     exploratory  . POLYPECTOMY    . s/p endometrial ablation  12/06  . TUBAL LIGATION    . UTERINE FIBROID SURGERY    . WISDOM TOOTH EXTRACTION      There were no vitals filed for this  visit.  Subjective Assessment - 08/17/17 1151    Subjective  rough day today    Currently in Pain?  Yes    Pain Score  6     Pain Location  Back                      [facility] Adult PT Treatment/Exercise - 08/17/17 0001      Lumbar Exercises: Machines for Strengthening   Other Lumbar Machine Exercise  UBE 2 fwd / 2 back      Lumbar Exercises: Standing   Scapular Retraction  Both;10 reps;Strengthening 3 sets different ways   3 sets different ways   Other Standing Lumbar Exercises  red tband hip ext and abd 12 reps ball vs wall CC and CW   ball vs wall CC and CW   Other Standing Lumbar Exercises  wt ball obl and ext 15 each      Lumbar Exercises: Seated   Other Seated Lumbar Exercises  ab crunch with ball 15 times hip flex into ball for lower abs   hip flex into ball for lower abs     Knee/Hip Exercises: Aerobic   Nustep  L 4 6 min      Moist Heat Therapy   Number Minutes Moist Heat  15 Minutes  Moist Heat Location  Lumbar Spine;Shoulder      Electrical Stimulation   Electrical Stimulation Location  L shoulder, back    Electrical Stimulation Action  premod    Electrical Stimulation Parameters  sitting    Electrical Stimulation Goals  Pain               PT Short Term Goals - 08/08/17 1427      PT SHORT TERM GOAL #1   Title  independent with initial HEP    Status  Achieved        PT Long Term Goals - 08/17/17 1218      PT LONG TERM GOAL #1   Title  decrease back pain 50%    Status  On-going      PT LONG TERM GOAL #2   Title  decrease left shoulder pain 50%    Status  On-going      PT LONG TERM GOAL #3   Title  walk .5 miles without difficulty    Status  On-going      PT LONG TERM GOAL #4   Title  increase AROM of the lumbar spine 25%    Baseline  no changes from eval    Status  On-going              Patient will benefit from skilled therapeutic intervention in order to improve the following deficits and impairments:      Visit Diagnosis: Muscle spasm of back  Difficulty in walking, not elsewhere classified  Acute bilateral low back pain with left-sided sciatica  Muscle weakness (generalized)     Problem List Patient Active Problem List   Diagnosis Date Noted  . Lumbosacral radiculopathy at L4 06/14/2017  . Easy bruising 03/29/2017  . Left leg paresthesias 03/29/2017  . Muscle cramping 03/20/2017  . 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 Pain in lower jaw 11/10/2016  . Cough 11/06/2016  . Vaginitis 09/17/2016  . Left wrist pain 09/16/2016  . Hypersomnia with sleep apnea 02/15/2016  . 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 Extrinsic asthma 02/15/2016  . Ankle edema 02/15/2016  . Degenerative arthritis of knee, bilateral 01/21/2016  . Asthma with exacerbation 10/20/2015  . Acute sinus infection 09/29/2015  . Cardiomyopathy ([place]) 09/08/2015  . Bilateral calf pain 08/31/2015  . Hyperlipidemia 08/28/2015  . Left knee pain 08/28/2015  . Varicose veins with pain 08/28/2015  . Peripheral edema 08/13/2015  . Injection site reaction 06/02/2015  . PHN (postherpetic neuralgia) 05/29/2015  . Unspecified asthma, with exacerbation 07/01/2014  . Shingles 06/10/2014  . Skin nodule 11/27/2012  . Diabetes ([place]) 01/28/2011  . Encounter for preventative adult health care exam with abnormal findings 01/28/2011  . LIPOMA 10/22/2010  . VITAMIN D DEFICIENCY 08/28/2009  . Hypothyroidism 05/09/2008  . Allergic rhinitis 05/09/2008  . HERPES ZOSTER W/NERVOUS COMPLICATION NEC 78/67/6720  . HYPERTHYROIDISM 05/23/2007  . OBESITY 05/23/2007  . Anxiety state 05/23/2007  . Asthma 05/23/2007  . ENDOMETRIOSIS NOS 05/23/2007  . INSOMNIA, HX OF 05/23/2007    PAYSEUR,ANGIE PTA  08/17/2017, 12:19 PM  [address] [place], [place], [address] Phone: [phone]   Fax:  [phone]  Name: Suzanne Hansen MRN: [id number] Date of  Birth: 1956-03-16

## 2017-08-22 ENCOUNTER — Encounter: Payer: Self-pay | Admitting: Internal Medicine

## 2017-08-22 ENCOUNTER — Encounter: Payer: Self-pay | Admitting: Physical Therapy

## 2017-08-22 ENCOUNTER — Ambulatory Visit (INDEPENDENT_AMBULATORY_CARE_PROVIDER_SITE_OTHER): Payer: BLUE CROSS/BLUE SHIELD | Admitting: Internal Medicine

## 2017-08-22 ENCOUNTER — Ambulatory Visit: Payer: BLUE CROSS/BLUE SHIELD | Admitting: Physical Therapy

## 2017-08-22 VITALS — BP 118/78 | HR 78 | Temp 98.0°F | Ht 64.5 in | Wt 243.0 lb

## 2017-08-22 DIAGNOSIS — E119 Type 2 diabetes mellitus without complications: Secondary | ICD-10-CM | POA: Diagnosis not present

## 2017-08-22 DIAGNOSIS — M6281 Muscle weakness (generalized): Secondary | ICD-10-CM

## 2017-08-22 DIAGNOSIS — R05 Cough: Secondary | ICD-10-CM | POA: Diagnosis not present

## 2017-08-22 DIAGNOSIS — R262 Difficulty in walking, not elsewhere classified: Secondary | ICD-10-CM

## 2017-08-22 DIAGNOSIS — M62830 Muscle spasm of back: Secondary | ICD-10-CM | POA: Diagnosis not present

## 2017-08-22 DIAGNOSIS — Z23 Encounter for immunization: Secondary | ICD-10-CM | POA: Diagnosis not present

## 2017-08-22 DIAGNOSIS — R059 Cough, unspecified: Secondary | ICD-10-CM

## 2017-08-22 DIAGNOSIS — J45909 Unspecified asthma, uncomplicated: Secondary | ICD-10-CM | POA: Diagnosis not present

## 2017-08-22 DIAGNOSIS — M5442 Lumbago with sciatica, left side: Secondary | ICD-10-CM

## 2017-08-22 MED ORDER — HYDROCOD POLST-CPM POLST ER 10-8 MG/5ML PO SUER
5.0000 mL | Freq: Two times a day (BID) | ORAL | 0 refills | Status: DC | PRN
Start: 2017-08-22 — End: 2017-12-25

## 2017-08-22 MED ORDER — LEVOFLOXACIN 500 MG PO TABS: 500.0000 mg | ORAL_TABLET | Freq: Every day | ORAL | 0 refills | Status: AC

## 2017-08-22 MED ORDER — PREDNISONE 10 MG PO TABS: ORAL_TABLET | ORAL | 0 refills | Status: DC

## 2017-08-22 NOTE — Assessment & Plan Note (Signed)
With mild exacerbation, for predpac asd, cont inhalers asd

## 2017-08-22 NOTE — Therapy (Signed)
[address] [place], [place], [address] Phone: [phone]   Fax:  [phone]  Physical Therapy Treatment  Patient Details  Name: Suzanne Hansen MRN: [id number] Date of Birth: Dec 17, 1955 Referring Provider: Saintclair Halsted   Encounter Date: 08/22/2017  PT End of Session - 08/22/17 1433    Visit Number  7    Date for PT Re-Evaluation  09/30/17    PT Start Time  1400    PT Stop Time  1455    PT Time Calculation (min)  55 min       Past Medical History:  Diagnosis Date  . ALLERGIC RHINITIS 05/09/2008  . Allergy   . ANXIETY 05/23/2007  . ARM PAIN, LEFT 10/23/2009  . Asthma   . ASTHMA 05/23/2007  . Cardiomyopathy ([place]) 09/08/2015  . CHF (congestive heart failure) ([place])   . CONTACT DERMATITIS 06/09/2009  . Diabetes mellitus   . ENDOMETRIOSIS NOS 05/23/2007  . FACIAL PAIN 06/02/2010  . FREQUENCY, URINARY 05/17/2010  . GLUCOSE INTOLERANCE 08/28/2009  . HERPES SIMPLEX, UNCOMPLICATED 12/23/1759  . Hyperlipidemia 08/28/2015  . HYPERTHYROIDISM 05/23/2007  . Hypoactive thyroid   . HYPOTHYROIDISM 05/09/2008  . Impaired glucose tolerance 01/28/2011  . INSOMNIA, HX OF 05/23/2007  . LATERAL EPICONDYLITIS, RIGHT 03/10/2009  . LIPOMA 10/22/2010  . NECK PAIN 11/10/2010  . OBESITY 05/23/2007  . Plantar fasciitis    Both feet  . SHOULDER PAIN, LEFT 05/17/2010  . SINUSITIS- ACUTE-NOS 11/03/2008  . SKIN LESION 11/10/2010  . Sleep apnea   . Unspecified Chest Pain 07/25/2008  . UNSPECIFIED URTICARIA 06/04/2009  . URI 08/28/2009  . UTI 04/29/2008  . VITAMIN D DEFICIENCY 08/28/2009  . Wheezing 07/12/2010    Past Surgical History:  Procedure Laterality Date  . COLONOSCOPY    . ENDOMETRIAL ABLATION    . LAMINECTOMY  1996  . LAPAROTOMY     exploratory  . POLYPECTOMY    . s/p endometrial ablation  12/06  . TUBAL LIGATION    . UTERINE FIBROID SURGERY    . WISDOM TOOTH EXTRACTION      There were no vitals filed for this  visit.  Subjective Assessment - 08/22/17 1358    Subjective  [place] [employer] [place] week and I felt it    Currently in Pain?  Yes    Pain Score  5     Pain Location  Back                      [facility] Adult PT Treatment/Exercise - 08/22/17 0001      Lumbar Exercises: Aerobic   UBE (Upper Arm Bike)  67fwd/2back L 3      Lumbar Exercises: Machines for Strengthening   Cybex Lumbar Extension  red tband 2 sets 15    Other Lumbar Machine Exercise  lat pull 15# 15 times 2 sets    Other Lumbar Machine Exercise  seated row 15# 2 sets 15      Lumbar Exercises: Standing   Other Standing Lumbar Exercises  wt ball obl and ext 15 each      Knee/Hip Exercises: Aerobic   Tread Mill  1.4 mph 5 min  .12 distance    Nustep  L 4 6 min      Knee/Hip Exercises: Machines for Strengthening   Cybex Knee Extension  5# 2 sets 10    Cybex Knee Flexion  20# 2 sets 10      Moist Heat  Therapy   Number Minutes Moist Heat  15 Minutes    Moist Heat Location  Lumbar Spine;Shoulder      Electrical Stimulation   Electrical Stimulation Location  L shoulder, back    Electrical Stimulation Action  premod    Electrical Stimulation Parameters  sitting    Electrical Stimulation Goals  Pain               PT Short Term Goals - 08/08/17 1427      PT SHORT TERM GOAL #1   Title  independent with initial HEP    Status  Achieved        PT Long Term Goals - 08/17/17 1218      PT LONG TERM GOAL #1   Title  decrease back pain 50%    Status  On-going      PT LONG TERM GOAL #2   Title  decrease left shoulder pain 50%    Status  On-going      PT LONG TERM GOAL #3   Title  walk .5 miles without difficulty    Status  On-going      PT LONG TERM GOAL #4   Title  increase AROM of the lumbar spine 25%    Baseline  no changes from eval    Status  On-going            Plan - 08/22/17 1434    Clinical Impression Statement  tolerated 15 min of cardio today without rest. per pt MD wants  her ot walk 2 miles /day but is limited by pain. Added some strengthening machines today.    PT Treatment/Interventions  ADLs/Self Care Home Management;Cryotherapy;Electrical Stimulation;Iontophoresis 4mg /ml Dexamethasone;Moist Heat;Traction;Ultrasound;Therapeutic activities;Gait training;Stair training;Functional mobility training;Patient/family education;Neuromuscular re-education;Balance training;Therapeutic exercise;Manual techniques;Vasopneumatic Device    PT Next Visit Plan  core strength and flexibility       Patient will benefit from skilled therapeutic intervention in order to improve the following deficits and impairments:  Abnormal gait, Cardiopulmonary status limiting activity, Decreased activity tolerance, Decreased balance, Decreased mobility, Decreased strength, Increased edema, Improper body mechanics, Impaired flexibility, Pain, Increased muscle spasms, Difficulty walking, Decreased range of motion  Visit Diagnosis: Muscle spasm of back  Difficulty in walking, not elsewhere classified  Acute bilateral low back pain with left-sided sciatica  Muscle weakness (generalized)     Problem List Patient Active Problem List   Diagnosis Date Noted  . Lumbosacral radiculopathy at L4 06/14/2017  . Easy bruising 03/29/2017  . Left leg paresthesias 03/29/2017  . Muscle cramping 03/20/2017  . 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 Pain in lower jaw 11/10/2016  . Cough 11/06/2016  . Vaginitis 09/17/2016  . Left wrist pain 09/16/2016  . Hypersomnia with sleep apnea 02/15/2016  . 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 Extrinsic asthma 02/15/2016  . Ankle edema 02/15/2016  . Degenerative arthritis of knee, bilateral 01/21/2016  . Asthma with exacerbation 10/20/2015  . Acute sinus infection 09/29/2015  . Cardiomyopathy ([place]) 09/08/2015  . Bilateral calf pain 08/31/2015  . Hyperlipidemia 08/28/2015  . Left knee pain 08/28/2015  . Varicose veins with pain 08/28/2015  .  Peripheral edema 08/13/2015  . Injection site reaction 06/02/2015  . PHN (postherpetic neuralgia) 05/29/2015  . Unspecified asthma, with exacerbation 07/01/2014  . Shingles 06/10/2014  . Skin nodule 11/27/2012  . Diabetes ([place]) 01/28/2011  . Encounter for preventative adult health care exam with abnormal findings 01/28/2011  . LIPOMA 10/22/2010  . VITAMIN D DEFICIENCY 08/28/2009  . Hypothyroidism 05/09/2008  .  Allergic rhinitis 05/09/2008  . HERPES ZOSTER W/NERVOUS COMPLICATION NEC 54/65/6812  . HYPERTHYROIDISM 05/23/2007  . OBESITY 05/23/2007  . Anxiety state 05/23/2007  . Asthma 05/23/2007  . ENDOMETRIOSIS NOS 05/23/2007  . INSOMNIA, HX OF 05/23/2007    Gerard Bonus,ANGIE PTA 08/22/2017, 2:35 PM  [address] [place], [place], [address] Phone: [phone]   Fax:  [phone]  Name: Suzanne Hansen MRN: [id number] Date of Birth: 1956/01/21

## 2017-08-22 NOTE — Assessment & Plan Note (Signed)
Mild to mod, c/w bronchitis vs pna, declines cxr, for antibx course, cough med prn,  to f/u any worsening symptoms or concerns 

## 2017-08-22 NOTE — Patient Instructions (Addendum)
You had the flu shot today  Please take all new medication as prescribed - the antibiotic, cough medicine, and prednisone  Please continue all other medications as before, and refills have been done if requested.  Please have the pharmacy call with any other refills you may need.  Please keep your appointments with your specialists as you may have planned

## 2017-08-22 NOTE — Progress Notes (Signed)
Subjective:    Patient ID: Suzanne Hansen, female    DOB: 01/21/1956, 61 y.o.   MRN: [id number]  HPI  Here with acute onset mild to mod 2-3 days severe ST, left ear pain, HA, general weakness and malaise, with prod cough greenish sputum, but Pt denies chest pain, increased sob or doe, wheezing, orthopnea, PND, increased LE swelling, palpitations, dizziness or syncope.  Has had recent spine surgury with cystectomy; has not been to work since Suzanne Hansen, but quite a bit of exposure to healthcare at multiple PT visits.  No other sick contacts.   Pt denies polydipsia, polyuria   Past Medical History:  Diagnosis Date  . ALLERGIC RHINITIS 05/09/2008  . Allergy   . ANXIETY 05/23/2007  . ARM PAIN, LEFT 10/23/2009  . Asthma   . ASTHMA 05/23/2007  . Cardiomyopathy ([place]) 09/08/2015  . CHF (congestive heart failure) ([place])   . CONTACT DERMATITIS 06/09/2009  . Diabetes mellitus   . ENDOMETRIOSIS NOS 05/23/2007  . FACIAL PAIN 06/02/2010  . FREQUENCY, URINARY 05/17/2010  . GLUCOSE INTOLERANCE 08/28/2009  . HERPES SIMPLEX, UNCOMPLICATED 11/09/8655  . Hyperlipidemia 08/28/2015  . HYPERTHYROIDISM 05/23/2007  . Hypoactive thyroid   . HYPOTHYROIDISM 05/09/2008  . Impaired glucose tolerance 01/28/2011  . INSOMNIA, HX OF 05/23/2007  . LATERAL EPICONDYLITIS, RIGHT 03/10/2009  . LIPOMA 10/22/2010  . NECK PAIN 11/10/2010  . OBESITY 05/23/2007  . Plantar fasciitis    Both feet  . SHOULDER PAIN, LEFT 05/17/2010  . SINUSITIS- ACUTE-NOS 11/03/2008  . SKIN LESION 11/10/2010  . Sleep apnea   . Unspecified Chest Pain 07/25/2008  . UNSPECIFIED URTICARIA 06/04/2009  . URI 08/28/2009  . UTI 04/29/2008  . VITAMIN D DEFICIENCY 08/28/2009  . Wheezing 07/12/2010   Past Surgical History:  Procedure Laterality Date  . COLONOSCOPY    . ENDOMETRIAL ABLATION    . LAMINECTOMY  1996  . LAPAROTOMY     exploratory  . POLYPECTOMY    . s/p endometrial ablation  12/06  . TUBAL LIGATION    . UTERINE FIBROID SURGERY    . WISDOM TOOTH  EXTRACTION      reports that she quit smoking about 29 years ago. Her smoking use included cigarettes. She smoked 0.50 packs per day. she has never used smokeless tobacco. She reports that she does not drink alcohol or use drugs. family history includes COPD in her father; Cancer in her maternal grandmother; Diabetes in her cousin and sister; Heart disease in her cousin and mother; Hypertension in her cousin and father; Kidney disease in her sister. Allergies  Allergen Reactions  . Ivp Dye [Iodinated Diagnostic Agents] Shortness Of Breath and Other (See Comments)    Swelling of extremity of the injection site  . Latex Itching    ITCHING AND COLD SORES AROUND MOUTH WHEN LATEX GLOVES USED BY THE DENTIST/HYGENTIST  . Clarithromycin Nausea And Vomiting    REACTION: nausea  vomiting  . Metronidazole Nausea And Vomiting   Current Outpatient Medications on File Prior to Visit  Medication Sig Dispense Refill  . albuterol (PROAIR HFA) 108 (90 Base) MCG/ACT inhaler INHALE 2 PUFFS INTO THE LUNGS EVERY 4 (FOUR) HOURS AS NEEDED FOR WHEEZING OR SHORTNESS OF BREATH. 8.5 Inhaler 1  . aspirin 81 MG EC tablet Take 81 mg by mouth daily.      . carvedilol (COREG) 3.125 MG tablet Take 1 tablet (3.125 mg total) by mouth 2 (two) times daily. 60 tablet 0  . Cholecalciferol (VITAMIN D) 2000 UNITS CAPS Take  2 capsules by mouth daily.    . Diclofenac Sodium 2 % SOLN Place 1 application onto the skin 2 (two) times daily. 112 g 3  . fexofenadine (ALLEGRA) 180 MG tablet Take 180 mg by mouth daily.      . furosemide (LASIX) 40 MG tablet TAKE 1 TABLET (40 MG TOTAL) BY MOUTH DAILY. 90 tablet 3  . gabapentin (NEURONTIN) 300 MG capsule Take 1 capsule (300 mg total) by mouth 3 (three) times daily. 90 capsule 5  . levothyroxine (SYNTHROID, LEVOTHROID) 112 MCG tablet Take 1 tablet (112 mcg total) by mouth daily. 90 tablet 3  . losartan (COZAAR) 50 MG tablet TAKE 1 TABLET BY MOUTH EVERY DAY 30 tablet 0  . magnesium oxide  (MAG-OX) 400 MG tablet Take 400 mg by mouth daily.    . meloxicam (MOBIC) 15 MG tablet Take 1 tablet (15 mg total) by mouth daily. 90 tablet 2  . Multiple Vitamin (MULTIVITAMIN) capsule Take 1 capsule by mouth daily.      . rosuvastatin (CRESTOR) 10 MG tablet TAKE 1 TABLET BY MOUTH EVERY DAY 90 tablet 3  . SYMBICORT 160-4.5 MCG/ACT inhaler INHALE 2 PUFFS INTO THE LUNGS 2 (TWO) TIMES DAILY. 10.2 Inhaler 2  . valACYclovir (VALTREX) 1000 MG tablet Take 500 mg by mouth daily.  0   No current facility-administered medications on file prior to visit.    Review of Systems  Constitutional: Negative for other unusual diaphoresis or sweats HENT: Negative for ear discharge or swelling Eyes: Negative for other worsening visual disturbances Respiratory: Negative for stridor or other swelling  Gastrointestinal: Negative for worsening distension or other blood Genitourinary: Negative for retention or other urinary change Musculoskeletal: Negative for other MSK pain or swelling Skin: Negative for color change or other new lesions Neurological: Negative for worsening tremors and other numbness  Psychiatric/Behavioral: Negative for worsening agitation or other fatigue All other system neg per pt    Objective:   Physical Exam BP 118/78   Pulse 78   Temp 98 F (36.7 C) (Oral)   Ht 5' 4.5" (1.638 m)   Wt 243 lb (110.2 kg)   SpO2 98%   BMI 41.07 kg/m  VS noted, mild ill Constitutional: Pt appears in NAD HENT: Head: NCAT.  Right Ear: External ear normal.  Left Ear: External ear normal.  Bilat tm's with mild erythema, left > right.  Max sinus areas mild tender.  Pharynx with severe erythema, no exudate Eyes: . Pupils are equal, round, and reactive to light. Conjunctivae and EOM are normal Nose: without d/c or deformity Neck: Neck supple. Gross normal ROM Cardiovascular: Normal rate and regular rhythm.   Pulmonary/Chest: Effort normal and breath sounds decreased without rales or wheezing.    Neurological: Pt is alert. At baseline orientation, motor grossly intact Skin: Skin is warm. No rashes, other new lesions, no LE edema Psychiatric: Pt behavior is normal without agitation  No other exam findings    Assessment & Plan:

## 2017-08-22 NOTE — Assessment & Plan Note (Signed)
Lab Results  Component Value Date   HGBA1C 5.9 03/21/2017  stable overall by history and exam, recent data reviewed with pt, and pt to continue medical treatment as before,  to f/u any worsening symptoms or concerns, pt to call for cbg > 200

## 2017-08-24 ENCOUNTER — Ambulatory Visit: Payer: BLUE CROSS/BLUE SHIELD | Admitting: Physical Therapy

## 2017-08-24 DIAGNOSIS — R262 Difficulty in walking, not elsewhere classified: Secondary | ICD-10-CM

## 2017-08-24 DIAGNOSIS — M5442 Lumbago with sciatica, left side: Secondary | ICD-10-CM

## 2017-08-24 DIAGNOSIS — M6281 Muscle weakness (generalized): Secondary | ICD-10-CM

## 2017-08-24 DIAGNOSIS — M62830 Muscle spasm of back: Secondary | ICD-10-CM | POA: Diagnosis not present

## 2017-08-24 NOTE — Therapy (Signed)
[address] [place], [place], [address] Phone: [phone]   Fax:  [phone]  Physical Therapy Treatment  Patient Details  Name: Kimmerly Lora MRN: [id number] Date of Birth: 04/07/1956 Referring Provider: Saintclair Halsted   Encounter Date: 08/24/2017  PT End of Session - 08/24/17 1130    Visit Number  8    Date for PT Re-Evaluation  09/30/17    PT Start Time  1100    PT Stop Time  1153    PT Time Calculation (min)  53 min       Past Medical History:  Diagnosis Date  . ALLERGIC RHINITIS 05/09/2008  . Allergy   . ANXIETY 05/23/2007  . ARM PAIN, LEFT 10/23/2009  . Asthma   . ASTHMA 05/23/2007  . Cardiomyopathy ([place]) 09/08/2015  . CHF (congestive heart failure) ([place])   . CONTACT DERMATITIS 06/09/2009  . Diabetes mellitus   . ENDOMETRIOSIS NOS 05/23/2007  . FACIAL PAIN 06/02/2010  . FREQUENCY, URINARY 05/17/2010  . GLUCOSE INTOLERANCE 08/28/2009  . HERPES SIMPLEX, UNCOMPLICATED 12/29/252  . Hyperlipidemia 08/28/2015  . HYPERTHYROIDISM 05/23/2007  . Hypoactive thyroid   . HYPOTHYROIDISM 05/09/2008  . Impaired glucose tolerance 01/28/2011  . INSOMNIA, HX OF 05/23/2007  . LATERAL EPICONDYLITIS, RIGHT 03/10/2009  . LIPOMA 10/22/2010  . NECK PAIN 11/10/2010  . OBESITY 05/23/2007  . Plantar fasciitis    Both feet  . SHOULDER PAIN, LEFT 05/17/2010  . SINUSITIS- ACUTE-NOS 11/03/2008  . SKIN LESION 11/10/2010  . Sleep apnea   . Unspecified Chest Pain 07/25/2008  . UNSPECIFIED URTICARIA 06/04/2009  . URI 08/28/2009  . UTI 04/29/2008  . VITAMIN D DEFICIENCY 08/28/2009  . Wheezing 07/12/2010    Past Surgical History:  Procedure Laterality Date  . COLONOSCOPY    . ENDOMETRIAL ABLATION    . LAMINECTOMY  1996  . LAPAROTOMY     exploratory  . POLYPECTOMY    . s/p endometrial ablation  12/06  . TUBAL LIGATION    . UTERINE FIBROID SURGERY    . WISDOM TOOTH EXTRACTION      There were no vitals filed for this  visit.  Subjective Assessment - 08/24/17 1057    Subjective  back some better today but shld is still bad    Currently in Pain?  Yes    Pain Score  5                       [facility] Adult PT Treatment/Exercise - 08/24/17 0001      Lumbar Exercises: Aerobic   UBE (Upper Arm Bike)  5fd/2back L 3      Lumbar Exercises: Machines for Strengthening   Cybex Lumbar Extension  black tband 2 sets 10      Lumbar Exercises: Standing   Scapular Retraction  Both;20 reps;Theraband    Theraband Level (Scapular Retraction)  Level 2 (Red)    Other Standing Lumbar Exercises  red tband hip ext and abd 15 reps      Knee/Hip Exercises: Aerobic   Tread Mill  1.7 mph 6 min    Nustep  L 5 6 min      Knee/Hip Exercises: Standing   Lateral Step Up  Both;1 set;10 reps;Hand Hold: 2;Step Height: 6"    Forward Step Up  Both;10 reps      Moist Heat Therapy   Number Minutes Moist Heat  15 Minutes    Moist Heat Location  Lumbar Spine;Shoulder  Acupuncturist Location  L shoulder, back    Electrical Stimulation Action  premod    Electrical Stimulation Parameters  sitting    Electrical Stimulation Goals  Pain               PT Short Term Goals - 08/08/17 1427      PT SHORT TERM GOAL #1   Title  independent with initial HEP    Status  Achieved        PT Long Term Goals - 08/24/17 1130      PT LONG TERM GOAL #1   Title  decrease back pain 50%    Status  On-going      PT LONG TERM GOAL #2   Title  decrease left shoulder pain 50%    Status  Not Met      PT LONG TERM GOAL #3   Title  walk .5 miles without difficulty    Status  Partially Met      PT LONG TERM GOAL #4   Title  increase AROM of the lumbar spine 25%    Status  Achieved            Plan - 08/24/17 1131    Clinical Impression Statement  tolerated 16 min of cardio today , 6 min was walking on TM 1.7 mph. hip weakness in standing as breaks needed with strengthening.  Progressing with goals    PT Treatment/Interventions  ADLs/Self Care Home Management;Cryotherapy;Electrical Stimulation;Iontophoresis '4mg'$ /ml Dexamethasone;Moist Heat;Traction;Ultrasound;Therapeutic activities;Gait training;Stair training;Functional mobility training;Patient/family education;Neuromuscular re-education;Balance training;Therapeutic exercise;Manual techniques;Vasopneumatic Device    PT Next Visit Plan  core strength and flexibility       Patient will benefit from skilled therapeutic intervention in order to improve the following deficits and impairments:  Abnormal gait, Cardiopulmonary status limiting activity, Decreased activity tolerance, Decreased balance, Decreased mobility, Decreased strength, Increased edema, Improper body mechanics, Impaired flexibility, Pain, Increased muscle spasms, Difficulty walking, Decreased range of motion  Visit Diagnosis: Muscle spasm of back  Difficulty in walking, not elsewhere classified  Acute bilateral low back pain with left-sided sciatica  Muscle weakness (generalized)     Problem List Patient Active Problem List   Diagnosis Date Noted  . Lumbosacral radiculopathy at L4 06/14/2017  . Easy bruising 03/29/2017  . Left leg paresthesias 03/29/2017  . Muscle cramping 03/20/2017  . 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 Pain in lower jaw 11/10/2016  . Cough 11/06/2016  . Vaginitis 09/17/2016  . Left wrist pain 09/16/2016  . Hypersomnia with sleep apnea 02/15/2016  . 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 Extrinsic asthma 02/15/2016  . Ankle edema 02/15/2016  . Degenerative arthritis of knee, bilateral 01/21/2016  . Asthma with exacerbation 10/20/2015  . Acute sinus infection 09/29/2015  . Cardiomyopathy ([place]) 09/08/2015  . Bilateral calf pain 08/31/2015  . Hyperlipidemia 08/28/2015  . Left knee pain 08/28/2015  . Varicose veins with pain 08/28/2015  . Peripheral edema 08/13/2015  . Injection site reaction 06/02/2015  .  PHN (postherpetic neuralgia) 05/29/2015  . Unspecified asthma, with exacerbation 07/01/2014  . Shingles 06/10/2014  . Skin nodule 11/27/2012  . Diabetes ([place]) 01/28/2011  . Encounter for preventative adult health care exam with abnormal findings 01/28/2011  . LIPOMA 10/22/2010  . VITAMIN D DEFICIENCY 08/28/2009  . Hypothyroidism 05/09/2008  . Allergic rhinitis 05/09/2008  . HERPES ZOSTER W/NERVOUS COMPLICATION NEC 60/07/9322  . HYPERTHYROIDISM 05/23/2007  . OBESITY 05/23/2007  . Anxiety state 05/23/2007  . Asthma 05/23/2007  . ENDOMETRIOSIS  NOS 05/23/2007  . INSOMNIA, HX OF 05/23/2007    PAYSEUR,ANGIE PTA 08/24/2017, 11:33 AM  [address] [place], [place], [address] Phone: [phone]   Fax:  [phone]  Name: Sharnell Knight MRN: [id number] Date of Birth: 10/02/1956

## 2017-08-28 ENCOUNTER — Ambulatory Visit (INDEPENDENT_AMBULATORY_CARE_PROVIDER_SITE_OTHER): Payer: BLUE CROSS/BLUE SHIELD | Admitting: Family Medicine

## 2017-08-28 ENCOUNTER — Ambulatory Visit: Payer: Self-pay

## 2017-08-28 ENCOUNTER — Encounter: Payer: Self-pay | Admitting: Family Medicine

## 2017-08-28 VITALS — BP 118/82 | Ht 64.0 in | Wt 245.0 lb

## 2017-08-28 DIAGNOSIS — M25512 Pain in left shoulder: Secondary | ICD-10-CM

## 2017-08-28 DIAGNOSIS — M7552 Bursitis of left shoulder: Secondary | ICD-10-CM

## 2017-08-28 MED ORDER — DICLOFENAC SODIUM 2 % TD SOLN: 2.0000 "application " | Freq: Two times a day (BID) | TRANSDERMAL | 3 refills | Status: DC

## 2017-08-28 MED ORDER — VITAMIN D (ERGOCALCIFEROL) 1.25 MG (50000 UNIT) PO CAPS: 50000.0000 [IU] | ORAL_CAPSULE | ORAL | 0 refills | Status: DC

## 2017-08-28 NOTE — Patient Instructions (Signed)
Good to see you  Suzanne Hansen is your friend.  pennsaid pinkie amount topically 2 times daily as needed.   Exercises 3 times a week.  Keep hands within peripheral vision.  See me again in 4 weeks

## 2017-08-28 NOTE — Progress Notes (Signed)
[facility] [address] [place], [place] [address] Phone: [phone] Subjective:    I'm seeing this patient by the request  of:    CC:   [id number]  Suzanne Hansen is a 61 y.o. female coming in for shoulder pain. She had surgery for a cyst on her spine on 9/27 which helped her knee pain. She felt that her shoulder pain started after the surgery from being sedentary in a supine position. She does note that she did hit her arm on the bedpost but is unsure of a mechanism of injury to her shoulder. She is able to raise her arm overhead but it is painful and sharp.   Onset-  Location Duration-  Character- Aggravating factors- Reliving factors-  Therapies tried-  Severity-     Past Medical History:  Diagnosis Date  . ALLERGIC RHINITIS 05/09/2008  . Allergy   . ANXIETY 05/23/2007  . ARM PAIN, LEFT 10/23/2009  . Asthma   . ASTHMA 05/23/2007  . Cardiomyopathy ([place]) 09/08/2015  . CHF (congestive heart failure) ([place])   . CONTACT DERMATITIS 06/09/2009  . Diabetes mellitus   . ENDOMETRIOSIS NOS 05/23/2007  . FACIAL PAIN 06/02/2010  . FREQUENCY, URINARY 05/17/2010  . GLUCOSE INTOLERANCE 08/28/2009  . HERPES SIMPLEX, UNCOMPLICATED 9/35/7017  . Hyperlipidemia 08/28/2015  . HYPERTHYROIDISM 05/23/2007  . Hypoactive thyroid   . HYPOTHYROIDISM 05/09/2008  . Impaired glucose tolerance 01/28/2011  . INSOMNIA, HX OF 05/23/2007  . LATERAL EPICONDYLITIS, RIGHT 03/10/2009  . LIPOMA 10/22/2010  . NECK PAIN 11/10/2010  . OBESITY 05/23/2007  . Plantar fasciitis    Both feet  . SHOULDER PAIN, LEFT 05/17/2010  . SINUSITIS- ACUTE-NOS 11/03/2008  . SKIN LESION 11/10/2010  . Sleep apnea   . Unspecified Chest Pain 07/25/2008  . UNSPECIFIED URTICARIA 06/04/2009  . URI 08/28/2009  . UTI 04/29/2008  . VITAMIN D DEFICIENCY 08/28/2009  . Wheezing 07/12/2010   Past Surgical History:  Procedure Laterality Date  . COLONOSCOPY    . ENDOMETRIAL ABLATION    . LAMINECTOMY  1996  .  LAPAROTOMY     exploratory  . POLYPECTOMY    . s/p endometrial ablation  12/06  . TUBAL LIGATION    . UTERINE FIBROID SURGERY    . WISDOM TOOTH EXTRACTION     Social History   Socioeconomic History  . Marital status: Divorced    Spouse name: Not on file  . Number of children: 3  . Years of education: Not on file  . Highest education level: Not on file  Social Needs  . Financial resource strain: Not on file  . Food insecurity - worry: Not on file  . Food insecurity - inability: Not on file  . Transportation needs - medical: Not on file  . Transportation needs - non-medical: Not on file  Occupational History  . Occupation: SOCIAL WORKER    Employer: [employer]    Comment: [employer]  Tobacco Use  . Smoking status: Former Smoker    Packs/day: 0.50    Types: Cigarettes    Last attempt to quit: 11/05/1987    Years since quitting: 29.8  . Smokeless tobacco: Never Used  Substance and Sexual Activity  . Alcohol use: No    Alcohol/week: 0.6 oz    Types: 1 Glasses of wine per week    Comment: WINE  . Drug use: No  . Sexual activity: Not on file  Other Topics Concern  . Not on file  Social  History Narrative  . Not on file   Allergies  Allergen Reactions  . Ivp Dye [Iodinated Diagnostic Agents] Shortness Of Breath and Other (See Comments)    Swelling of extremity of the injection site  . Latex Itching    ITCHING AND COLD SORES AROUND MOUTH WHEN LATEX GLOVES USED BY THE DENTIST/HYGENTIST  . Clarithromycin Nausea And Vomiting    REACTION: nausea  vomiting  . Metronidazole Nausea And Vomiting   Family History  Problem Relation Age of Onset  . Diabetes Cousin   . Hypertension Cousin   . Heart disease Cousin   . Heart disease Mother        Rheumatic heart disease  . COPD Father   . Hypertension Father   . Cancer Maternal Grandmother        Breast  . Diabetes Sister   . Kidney disease Sister      Past medical history, social, surgical and family  history all reviewed in electronic medical record.  No pertanent information unless stated regarding to the chief complaint.   Review of Systems:Review of systems updated and as accurate as of 08/28/17  No headache, visual changes, nausea, vomiting, diarrhea, constipation, dizziness, abdominal pain, skin rash, fevers, chills, night sweats, weight loss, swollen lymph nodes, body aches, joint swelling, muscle aches, chest pain, shortness of breath, mood changes.   Objective  There were no vitals taken for this visit. Systems examined below as of 08/28/17   General: No apparent distress alert and oriented x3 mood and affect normal, dressed appropriately.  HEENT: Pupils equal, extraocular movements intact  Respiratory: Patient's speak in full sentences and does not appear short of breath  Cardiovascular: No lower extremity edema, non tender, no erythema  Skin: Warm dry intact with no signs of infection or rash on extremities or on axial skeleton.  Abdomen: Soft nontender  Neuro: Cranial nerves II through XII are intact, neurovascularly intact in all extremities with 2+ DTRs and 2+ pulses.  Lymph: No lymphadenopathy of posterior or anterior cervical chain or axillae bilaterally.  Gait normal with good balance and coordination.  MSK:  Non tender with full range of motion and good stability and symmetric strength and tone of shoulders, elbows, wrist, hip, knee and ankles bilaterally.     Impression and Recommendations:     This case required medical decision making of moderate complexity.      Note: This dictation was prepared with Dragon dictation along with smaller phrase technology. Any transcriptional errors that result from this process are unintentional.

## 2017-08-28 NOTE — Progress Notes (Signed)
[name] [address] [place], [place] [address] Phone: [phone] Subjective:    I'm seeing this patient by the request  of:    CC: Left shoulder pain  [id number]  Suzanne Hansen is a 61 y.o. female coming in with complaint of left shoulder pain.  Patient states that after her back surgery started having increasing more pain.  Was doing a lot more lifting with the left arm.  Feels like that is contributing.  We can her up at night.  Seem to localize.  Rates the severity of pain is 7 out of 10.  No radiation down the arm.  No associated neck pain      Past Medical History:  Diagnosis Date  . ALLERGIC RHINITIS 05/09/2008  . Allergy   . ANXIETY 05/23/2007  . ARM PAIN, LEFT 10/23/2009  . Asthma   . ASTHMA 05/23/2007  . Cardiomyopathy ([place]) 09/08/2015  . CHF (congestive heart failure) ([facility])   . CONTACT DERMATITIS 06/09/2009  . Diabetes mellitus   . ENDOMETRIOSIS NOS 05/23/2007  . FACIAL PAIN 06/02/2010  . FREQUENCY, URINARY 05/17/2010  . GLUCOSE INTOLERANCE 08/28/2009  . HERPES SIMPLEX, UNCOMPLICATED 2/84/1324  . Hyperlipidemia 08/28/2015  . HYPERTHYROIDISM 05/23/2007  . Hypoactive thyroid   . HYPOTHYROIDISM 05/09/2008  . Impaired glucose tolerance 01/28/2011  . INSOMNIA, HX OF 05/23/2007  . LATERAL EPICONDYLITIS, RIGHT 03/10/2009  . LIPOMA 10/22/2010  . NECK PAIN 11/10/2010  . OBESITY 05/23/2007  . Plantar fasciitis    Both feet  . SHOULDER PAIN, LEFT 05/17/2010  . SINUSITIS- ACUTE-NOS 11/03/2008  . SKIN LESION 11/10/2010  . Sleep apnea   . Unspecified Chest Pain 07/25/2008  . UNSPECIFIED URTICARIA 06/04/2009  . URI 08/28/2009  . UTI 04/29/2008  . VITAMIN D DEFICIENCY 08/28/2009  . Wheezing 07/12/2010   Past Surgical History:  Procedure Laterality Date  . COLONOSCOPY    . ENDOMETRIAL ABLATION    . LAMINECTOMY  1996  . LAPAROTOMY     exploratory  . POLYPECTOMY    . s/p endometrial ablation  12/06  . TUBAL LIGATION    . UTERINE FIBROID SURGERY     . WISDOM TOOTH EXTRACTION     Social History   Socioeconomic History  . Marital status: Divorced    Spouse name: Not on file  . Number of children: 3  . Years of education: Not on file  . Highest education level: Not on file  Social Needs  . Financial resource strain: Not on file  . Food insecurity - worry: Not on file  . Food insecurity - inability: Not on file  . Transportation needs - medical: Not on file  . Transportation needs - non-medical: Not on file  Occupational History  . Occupation: SOCIAL WORKER    Employer: [employer]    Comment: [employer]  Tobacco Use  . Smoking status: Former Smoker    Packs/day: 0.50    Types: Cigarettes    Last attempt to quit: 11/05/1987    Years since quitting: 29.8  . Smokeless tobacco: Never Used  Substance and Sexual Activity  . Alcohol use: No    Alcohol/week: 0.6 oz    Types: 1 Glasses of wine per week    Comment: WINE  . Drug use: No  . Sexual activity: Not on file  Other Topics Concern  . Not on file  Social History Narrative  . Not on file   Allergies  Allergen Reactions  . Ivp Dye [Iodinated  Diagnostic Agents] Shortness Of Breath and Other (See Comments)    Swelling of extremity of the injection site  . Latex Itching    ITCHING AND COLD SORES AROUND MOUTH WHEN LATEX GLOVES USED BY THE DENTIST/HYGENTIST  . Clarithromycin Nausea And Vomiting    REACTION: nausea  vomiting  . Metronidazole Nausea And Vomiting   Family History  Problem Relation Age of Onset  . Diabetes Cousin   . Hypertension Cousin   . Heart disease Cousin   . Heart disease Mother        Rheumatic heart disease  . COPD Father   . Hypertension Father   . Cancer Maternal Grandmother        Breast  . Diabetes Sister   . Kidney disease Sister      Past medical history, social, surgical and family history all reviewed in electronic medical record.  No pertanent information unless stated regarding to the chief complaint.   Review  of Systems:Review of systems updated and as accurate as of 08/28/17  No headache, visual changes, nausea, vomiting, diarrhea, constipation, dizziness, abdominal pain, skin rash, fevers, chills, night sweats, weight loss, swollen lymph nodes, body aches, joint swelling,  chest pain, shortness of breath, mood changes.  Positive muscle aches  Objective  Blood pressure 118/82, height 5\' 4"  (1.626 m), weight 245 lb (111.1 kg). Systems examined below as of 08/28/17   General: No apparent distress alert and oriented x3 mood and affect normal, dressed appropriately.  HEENT: Pupils equal, extraocular movements intact  Respiratory: Patient's speak in full sentences and does not appear short of breath  Cardiovascular: No lower extremity edema, non tender, no erythema  Skin: Warm dry intact with no signs of infection or rash on extremities or on axial skeleton.  Abdomen: Soft nontender  Neuro: Cranial nerves II through XII are intact, neurovascularly intact in all extremities with 2+ DTRs and 2+ pulses.  Lymph: No lymphadenopathy of posterior or anterior cervical chain or axillae bilaterally.  Gait normal with good balance and coordination.  MSK:  Non tender with full range of motion and good stability and symmetric strength and tone of  elbows, wrist, hip, knee and ankles bilaterally.  Shoulder: left Inspection reveals no abnormalities, atrophy or asymmetry. Palpation is normal with no tenderness over AC joint or bicipital groove. ROM is full in all planes passively. Rotator cuff strength normal throughout. signs of impingement with positive Neer and Hawkin's tests, but negative empty can sign. Speeds and Yergason's tests normal. No labral pathology noted with negative Obrien's, negative clunk and good stability. Normal scapular function observed. No painful arc and no drop arm sign. No apprehension sign Contralateral shoulder unremarkable  MSK US performed of: left This study was ordered,  performed, and interpreted by Charlann Boxer D.O.  Shoulder:   Supraspinatus:  Appears normal on long and transverse views, Bursal bulge seen with shoulder abduction on impingement view. Infraspinatus:  Appears normal on long and transverse views. Significant increase in Doppler flow Subscapularis:  Appears normal on long and transverse views. Positive bursa Teres Minor:  Appears normal on long and transverse views. AC joint:  Capsule undistended, no geyser sign. Glenohumeral Joint:  Appears normal without effusion. Glenoid Labrum:  Intact without visualized tears. Biceps Tendon:  Appears normal on long and transverse views, no fraying of tendon, tendon located in intertubercular groove, no subluxation with shoulder internal or external rotation.  Impression: Subacromial bursitis  Procedure: Real-time Ultrasound Guided Injection of left glenohumeral joint Device: GE Logiq E  Ultrasound guided injection is preferred based studies that show increased duration, increased effect, greater accuracy, decreased procedural pain, increased response rate with ultrasound guided versus blind injection.  Verbal informed consent obtained.  Time-out conducted.  Noted no overlying erythema, induration, or other signs of local infection.  Skin prepped in a sterile fashion.  Local anesthesia: Topical Ethyl chloride.  With sterile technique and under real time ultrasound guidance:  Joint visualized.  23g 1  inch needle inserted posterior approach. Pictures taken for needle placement. Patient did have injection of 2 cc of 1% lidocaine, 2 cc of 0.5% Marcaine, and 1.0 cc of Kenalog 40 mg/dL. Completed without difficulty  Pain immediately resolved suggesting accurate placement of the medication.  Advised to call if fevers/chills, erythema, induration, drainage, or persistent bleeding.  Images permanently stored and available for review in the ultrasound unit.  Impression: Technically successful ultrasound guided  injection.   Impression and Recommendations:     This case required medical decision making of moderate complexity.      Note: This dictation was prepared with Dragon dictation along with smaller phrase technology. Any transcriptional errors that result from this process are unintentional.

## 2017-08-28 NOTE — Assessment & Plan Note (Signed)
Patient has mild bursitis.  Likely reactive from patient being somewhat immobile for some time patient is doing relatively better at this time.  We will give an injection.  He is doing which wants to avoid.  Patient will start to increase activity slowly over the course of the next several days.  Patient will come back and see me again 4 weeks

## 2017-08-29 ENCOUNTER — Ambulatory Visit: Payer: BLUE CROSS/BLUE SHIELD | Admitting: Physical Therapy

## 2017-08-29 ENCOUNTER — Ambulatory Visit: Payer: BLUE CROSS/BLUE SHIELD | Admitting: Family Medicine

## 2017-09-05 ENCOUNTER — Encounter: Payer: Self-pay | Admitting: Physical Therapy

## 2017-09-05 ENCOUNTER — Ambulatory Visit: Payer: BLUE CROSS/BLUE SHIELD | Admitting: Physical Therapy

## 2017-09-05 DIAGNOSIS — M5442 Lumbago with sciatica, left side: Secondary | ICD-10-CM

## 2017-09-05 DIAGNOSIS — M62830 Muscle spasm of back: Secondary | ICD-10-CM

## 2017-09-05 DIAGNOSIS — R262 Difficulty in walking, not elsewhere classified: Secondary | ICD-10-CM

## 2017-09-05 NOTE — Therapy (Signed)
[address] [place], [place], [address] Phone: [phone]   Fax:  [phone]  Physical Therapy Treatment  Patient Details  Name: Suzanne Hansen Date of Birth: 01-21-1956 Referring Provider: Saintclair Halsted   Encounter Date: 09/05/2017  PT End of Session - 09/05/17 1726    Visit Number  9    Date for PT Re-Evaluation  09/30/17    PT Start Time  1700    PT Stop Time  1745    PT Time Calculation (min)  45 min       Past Medical History:  Diagnosis Date  . ALLERGIC RHINITIS 05/09/2008  . Allergy   . ANXIETY 05/23/2007  . ARM PAIN, LEFT 10/23/2009  . Asthma   . ASTHMA 05/23/2007  . Cardiomyopathy ([place]) 09/08/2015  . CHF (congestive heart failure) ([place])   . CONTACT DERMATITIS 06/09/2009  . Diabetes mellitus   . ENDOMETRIOSIS NOS 05/23/2007  . FACIAL PAIN 06/02/2010  . FREQUENCY, URINARY 05/17/2010  . GLUCOSE INTOLERANCE 08/28/2009  . HERPES SIMPLEX, UNCOMPLICATED 8/54/6270  . Hyperlipidemia 08/28/2015  . HYPERTHYROIDISM 05/23/2007  . Hypoactive thyroid   . HYPOTHYROIDISM 05/09/2008  . Impaired glucose tolerance 01/28/2011  . INSOMNIA, HX OF 05/23/2007  . LATERAL EPICONDYLITIS, RIGHT 03/10/2009  . LIPOMA 10/22/2010  . NECK PAIN 11/10/2010  . OBESITY 05/23/2007  . Plantar fasciitis    Both feet  . SHOULDER PAIN, LEFT 05/17/2010  . SINUSITIS- ACUTE-NOS 11/03/2008  . SKIN LESION 11/10/2010  . Sleep apnea   . Unspecified Chest Pain 07/25/2008  . UNSPECIFIED URTICARIA 06/04/2009  . URI 08/28/2009  . UTI 04/29/2008  . VITAMIN D DEFICIENCY 08/28/2009  . Wheezing 07/12/2010    Past Surgical History:  Procedure Laterality Date  . COLONOSCOPY    . ENDOMETRIAL ABLATION    . LAMINECTOMY  1996  . LAPAROTOMY     exploratory  . POLYPECTOMY    . s/p endometrial ablation  12/06  . TUBAL LIGATION    . UTERINE FIBROID SURGERY    . WISDOM TOOTH EXTRACTION      There were no vitals filed for this  visit.  Subjective Assessment - 09/05/17 1657    Subjective  back to work yesterday so worn out. saw MD for shld RTC tear- got injection    Currently in Pain?  Yes    Pain Score  5     Pain Location  Back                      [facility] Adult PT Treatment/Exercise - 09/05/17 0001      Lumbar Exercises: Standing   Other Standing Lumbar Exercises  red tband hip ext and abd 15 reps      Knee/Hip Exercises: Aerobic   Nustep  L 5 6 min      Knee/Hip Exercises: Machines for Strengthening   Cybex Knee Extension  5# 2 sets 10    Cybex Knee Flexion  20# 2 sets 10      Knee/Hip Exercises: Seated   Ball Squeeze  20 reps    Other Seated Knee/Hip Exercises  hip abd and hip flex red tband 15 times each      Moist Heat Therapy   Number Minutes Moist Heat  15 Minutes    Moist Heat Location  Lumbar Spine;Shoulder      Electrical Stimulation   Electrical Stimulation Location  LB    Electrical Stimulation Action  IFC  Electrical Stimulation Parameters  sitting    Electrical Stimulation Goals  Pain               PT Short Term Goals - 08/08/17 1427      PT SHORT TERM GOAL #1   Title  independent with initial HEP    Status  Achieved        PT Long Term Goals - 08/24/17 1130      PT LONG TERM GOAL #1   Title  decrease back pain 50%    Status  On-going      PT LONG TERM GOAL #2   Title  decrease left shoulder pain 50%    Status  Not Met      PT LONG TERM GOAL #3   Title  walk .5 miles without difficulty    Status  Partially Met      PT LONG TERM GOAL #4   Title  increase AROM of the lumbar spine 25%    Status  Achieved            Plan - 09/05/17 1726    Clinical Impression Statement  no arm ex d/t RTC injection. heart and kideny issues last week in ER so slow return with cardio and ex today- tolerated fair    PT Treatment/Interventions  ADLs/Self Care Home Management;Cryotherapy;Electrical Stimulation;Iontophoresis 42m/ml Dexamethasone;Moist  Heat;Traction;Ultrasound;Therapeutic activities;Gait training;Stair training;Functional mobility training;Patient/family education;Neuromuscular re-education;Balance training;Therapeutic exercise;Manual techniques;Vasopneumatic Device    PT Next Visit Plan  strength and cardio       Patient will benefit from skilled therapeutic intervention in order to improve the following deficits and impairments:  Abnormal gait, Cardiopulmonary status limiting activity, Decreased activity tolerance, Decreased balance, Decreased mobility, Decreased strength, Increased edema, Improper body mechanics, Impaired flexibility, Pain, Increased muscle spasms, Difficulty walking, Decreased range of motion  Visit Diagnosis: Muscle spasm of back  Difficulty in walking, not elsewhere classified  Acute bilateral low back pain with left-sided sciatica     Problem List Patient Active Problem List   Diagnosis Date Noted  . Acute shoulder bursitis, left 08/28/2017  . Lumbosacral radiculopathy at L4 06/14/2017  . Easy bruising 03/29/2017  . Left leg paresthesias 03/29/2017  . Muscle cramping 03/20/2017  . 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 Pain in lower jaw 11/10/2016  . Cough 11/06/2016  . Vaginitis 09/17/2016  . Left wrist pain 09/16/2016  . Hypersomnia with sleep apnea 02/15/2016  . 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 Extrinsic asthma 02/15/2016  . Ankle edema 02/15/2016  . Degenerative arthritis of knee, bilateral 01/21/2016  . Asthma with exacerbation 10/20/2015  . Acute sinus infection 09/29/2015  . Cardiomyopathy ([place] 09/08/2015  . Bilateral calf pain 08/31/2015  . Hyperlipidemia 08/28/2015  . Left knee pain 08/28/2015  . Varicose veins with pain 08/28/2015  . Peripheral edema 08/13/2015  . Injection site reaction 06/02/2015  . PHN (postherpetic neuralgia) 05/29/2015  . Unspecified asthma, with exacerbation 07/01/2014  . Shingles 06/10/2014  . Skin nodule 11/27/2012  . Diabetes  ([facility] 01/28/2011  . Encounter for preventative adult health care exam with abnormal findings 01/28/2011  . LIPOMA 10/22/2010  . VITAMIN D DEFICIENCY 08/28/2009  . Hypothyroidism 05/09/2008  . Allergic rhinitis 05/09/2008  . HERPES ZOSTER W/NERVOUS COMPLICATION NEC 095/62/1308 . HYPERTHYROIDISM 05/23/2007  . OBESITY 05/23/2007  . Anxiety state 05/23/2007  . Asthma 05/23/2007  . ENDOMETRIOSIS NOS 05/23/2007  . INSOMNIA, HX OF 05/23/2007    Suzanne Hansen,Suzanne Hansen 09/05/2017, 5:28 PM  [facility]  [address] [place], [place], [address] Phone: [phone]   Fax:  [phone]  Suzanne Hansen Date of Birth: August 21, 1956

## 2017-09-06 ENCOUNTER — Ambulatory Visit: Payer: BLUE CROSS/BLUE SHIELD | Admitting: Family Medicine

## 2017-09-07 ENCOUNTER — Ambulatory Visit: Payer: BLUE CROSS/BLUE SHIELD | Admitting: Physical Therapy

## 2017-09-07 DIAGNOSIS — R262 Difficulty in walking, not elsewhere classified: Secondary | ICD-10-CM

## 2017-09-07 DIAGNOSIS — M62830 Muscle spasm of back: Secondary | ICD-10-CM | POA: Diagnosis not present

## 2017-09-07 DIAGNOSIS — M6281 Muscle weakness (generalized): Secondary | ICD-10-CM

## 2017-09-07 DIAGNOSIS — M5442 Lumbago with sciatica, left side: Secondary | ICD-10-CM

## 2017-09-07 NOTE — Therapy (Signed)
[address] [place], [place], [address] Phone: [phone]   Fax:  [phone]  Physical Therapy Treatment  Patient Details  Name: Suzanne Hansen MRN: [id number] Date of Birth: [birth year] Referring Provider: Saintclair Halsted   Encounter Date: 09/07/2017  PT End of Session - 09/07/17 1049    Visit Number  10    Date for PT Re-Evaluation  09/30/17    PT Start Time  9937    PT Stop Time  1110    PT Time Calculation (min)  55 min       Past Medical History:  Diagnosis Date  . ALLERGIC RHINITIS 05/09/2008  . Allergy   . ANXIETY 05/23/2007  . ARM PAIN, LEFT 10/23/2009  . Asthma   . ASTHMA 05/23/2007  . Cardiomyopathy ([place]) 09/08/2015  . CHF (congestive heart failure) ([place])   . CONTACT DERMATITIS 06/09/2009  . Diabetes mellitus   . ENDOMETRIOSIS NOS 05/23/2007  . FACIAL PAIN 06/02/2010  . FREQUENCY, URINARY 05/17/2010  . GLUCOSE INTOLERANCE 08/28/2009  . HERPES SIMPLEX, UNCOMPLICATED 1/69/6789  . Hyperlipidemia 08/28/2015  . HYPERTHYROIDISM 05/23/2007  . Hypoactive thyroid   . HYPOTHYROIDISM 05/09/2008  . Impaired glucose tolerance 01/28/2011  . INSOMNIA, HX OF 05/23/2007  . LATERAL EPICONDYLITIS, RIGHT 03/10/2009  . LIPOMA 10/22/2010  . NECK PAIN 11/10/2010  . OBESITY 05/23/2007  . Plantar fasciitis    Both feet  . SHOULDER PAIN, LEFT 05/17/2010  . SINUSITIS- ACUTE-NOS 11/03/2008  . SKIN LESION 11/10/2010  . Sleep apnea   . Unspecified Chest Pain 07/25/2008  . UNSPECIFIED URTICARIA 06/04/2009  . URI 08/28/2009  . UTI 04/29/2008  . VITAMIN D DEFICIENCY 08/28/2009  . Wheezing 07/12/2010    Past Surgical History:  Procedure Laterality Date  . COLONOSCOPY    . ENDOMETRIAL ABLATION    . LAMINECTOMY  1996  . LAPAROTOMY     exploratory  . POLYPECTOMY    . s/p endometrial ablation  12/06  . TUBAL LIGATION    . UTERINE FIBROID SURGERY    . WISDOM TOOTH EXTRACTION      There were no vitals filed for this  visit.  Subjective Assessment - 09/07/17 1023    Subjective  okay after last tx, shld better- helped to  not use it    Currently in Pain?  Yes    Pain Score  4     Pain Location  Back                      [facility] Adult PT Treatment/Exercise - 09/07/17 0001      Lumbar Exercises: Aerobic   Tread Mill  1.7 mph 8 min .22 distance      Lumbar Exercises: Machines for Strengthening   Cybex Lumbar Extension  black tband 2 sets 10 trunk flexion 2 sets 10      Lumbar Exercises: Standing   Other Standing Lumbar Exercises  red tband hip ext and abd 15 reps    Other Standing Lumbar Exercises  yellow tband shld ext,retraction and ER      Knee/Hip Exercises: Machines for Strengthening   Cybex Knee Extension  10# 2 sets 10    Cybex Knee Flexion  25# 2 sets 10      Knee/Hip Exercises: Seated   Ball Squeeze  20 reps    Other Seated Knee/Hip Exercises  hip abd and hip flex green tband 15 times each      Moist Heat Therapy  Number Minutes Moist Heat  15 Minutes    Moist Heat Location  Lumbar Spine;Shoulder      Electrical Stimulation   Electrical Stimulation Location  LB    Electrical Stimulation Action  IFC    Electrical Stimulation Parameters  sitting    Electrical Stimulation Goals  Pain             PT Education - 09/07/17 1048    Education provided  Yes    Education Details  reviewed RTC ex and stretches MD gave her    Person(s) Educated  Patient    Methods  Explanation;Demonstration    Comprehension  Verbalized understanding;Returned demonstration       PT Short Term Goals - 08/08/17 1427      PT SHORT TERM GOAL #1   Title  independent with initial HEP    Status  Achieved        PT Long Term Goals - 09/07/17 1049      PT LONG TERM GOAL #1   Title  decrease back pain 50%    Status  Partially Met      PT LONG TERM GOAL #2   Title  decrease left shoulder pain 50%    Status  On-going      PT LONG TERM GOAL #3   Title  walk .5 miles without  difficulty    Baseline  1/4 mile    Status  Partially Met      PT LONG TERM GOAL #4   Title  increase AROM of the lumbar spine 25%    Status  Achieved            Plan - 09/07/17 1050    Clinical Impression Statement  progressing with goals for decreasing back paina d nincreasing strength. walked today on TM .22 8 min    PT Treatment/Interventions  ADLs/Self Care Home Management;Cryotherapy;Electrical Stimulation;Iontophoresis 13m/ml Dexamethasone;Moist Heat;Traction;Ultrasound;Therapeutic activities;Gait training;Stair training;Functional mobility training;Patient/family education;Neuromuscular re-education;Balance training;Therapeutic exercise;Manual techniques;Vasopneumatic Device    PT Next Visit Plan  strength and cardio       Patient will benefit from skilled therapeutic intervention in order to improve the following deficits and impairments:  Abnormal gait, Cardiopulmonary status limiting activity, Decreased activity tolerance, Decreased balance, Decreased mobility, Decreased strength, Increased edema, Improper body mechanics, Impaired flexibility, Pain, Increased muscle spasms, Difficulty walking, Decreased range of motion  Visit Diagnosis: Muscle spasm of back  Difficulty in walking, not elsewhere classified  Acute bilateral low back pain with left-sided sciatica  Muscle weakness (generalized)     Problem List Patient Active Problem List   Diagnosis Date Noted  . Acute shoulder bursitis, left 08/28/2017  . Lumbosacral radiculopathy at L4 06/14/2017  . Easy bruising 03/29/2017  . Left leg paresthesias 03/29/2017  . Muscle cramping 03/20/2017  . 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 Pain in lower jaw 11/10/2016  . Cough 11/06/2016  . Vaginitis 09/17/2016  . Left wrist pain 09/16/2016  . Hypersomnia with sleep apnea 02/15/2016  . 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 Extrinsic asthma 02/15/2016  . Ankle edema 02/15/2016  . Degenerative arthritis of  knee, bilateral 01/21/2016  . Asthma with exacerbation 10/20/2015  . Acute sinus infection 09/29/2015  . Cardiomyopathy ([facility] 09/08/2015  . Bilateral calf pain 08/31/2015  . Hyperlipidemia 08/28/2015  . Left knee pain 08/28/2015  . Varicose veins with pain 08/28/2015  . Peripheral edema 08/13/2015  . Injection site reaction 06/02/2015  . PHN (postherpetic neuralgia) 05/29/2015  . Unspecified asthma, with exacerbation 07/01/2014  .  Shingles 06/10/2014  . Skin nodule 11/27/2012  . Diabetes ([place]) 01/28/2011  . Encounter for preventative adult health care exam with abnormal findings 01/28/2011  . LIPOMA 10/22/2010  . VITAMIN D DEFICIENCY 08/28/2009  . Hypothyroidism 05/09/2008  . Allergic rhinitis 05/09/2008  . HERPES ZOSTER W/NERVOUS COMPLICATION NEC 75/19/8242  . HYPERTHYROIDISM 05/23/2007  . OBESITY 05/23/2007  . Anxiety state 05/23/2007  . Asthma 05/23/2007  . ENDOMETRIOSIS NOS 05/23/2007  . INSOMNIA, HX OF 05/23/2007    Salam Chesterfield,ANGIE PTA 09/07/2017, 10:51 AM  [address] [place], [place], [address] Phone: [phone]   Fax:  [phone]  Name: Suzanne Hansen MRN: [id number] Date of Birth: 1956-02-21

## 2017-09-12 ENCOUNTER — Ambulatory Visit: Payer: BLUE CROSS/BLUE SHIELD | Attending: Family Medicine | Admitting: Physical Therapy

## 2017-09-12 DIAGNOSIS — M6281 Muscle weakness (generalized): Secondary | ICD-10-CM

## 2017-09-12 DIAGNOSIS — R262 Difficulty in walking, not elsewhere classified: Secondary | ICD-10-CM

## 2017-09-12 DIAGNOSIS — M62830 Muscle spasm of back: Secondary | ICD-10-CM | POA: Insufficient documentation

## 2017-09-12 DIAGNOSIS — M5442 Lumbago with sciatica, left side: Secondary | ICD-10-CM | POA: Diagnosis present

## 2017-09-12 NOTE — Therapy (Signed)
[address] [place], [place], [address] Phone: [phone]   Fax:  [phone]  Physical Therapy Treatment  Patient Details  Name: Suzanne Hansen MRN: [id number] Date of Birth: Jul 14, 1956 Referring Provider: Saintclair Halsted   Encounter Date: 09/12/2017  PT End of Session - 09/12/17 5284    Visit Number  11    Date for PT Re-Evaluation  09/30/17    PT Start Time  1700    PT Stop Time  1755    PT Time Calculation (min)  55 min       Past Medical History:  Diagnosis Date  . ALLERGIC RHINITIS 05/09/2008  . Allergy   . ANXIETY 05/23/2007  . ARM PAIN, LEFT 10/23/2009  . Asthma   . ASTHMA 05/23/2007  . Cardiomyopathy ([place]) 09/08/2015  . CHF (congestive heart failure) ([place])   . CONTACT DERMATITIS 06/09/2009  . Diabetes mellitus   . ENDOMETRIOSIS NOS 05/23/2007  . FACIAL PAIN 06/02/2010  . FREQUENCY, URINARY 05/17/2010  . GLUCOSE INTOLERANCE 08/28/2009  . HERPES SIMPLEX, UNCOMPLICATED 1/32/4401  . Hyperlipidemia 08/28/2015  . HYPERTHYROIDISM 05/23/2007  . Hypoactive thyroid   . HYPOTHYROIDISM 05/09/2008  . Impaired glucose tolerance 01/28/2011  . INSOMNIA, HX OF 05/23/2007  . LATERAL EPICONDYLITIS, RIGHT 03/10/2009  . LIPOMA 10/22/2010  . NECK PAIN 11/10/2010  . OBESITY 05/23/2007  . Plantar fasciitis    Both feet  . SHOULDER PAIN, LEFT 05/17/2010  . SINUSITIS- ACUTE-NOS 11/03/2008  . SKIN LESION 11/10/2010  . Sleep apnea   . Unspecified Chest Pain 07/25/2008  . UNSPECIFIED URTICARIA 06/04/2009  . URI 08/28/2009  . UTI 04/29/2008  . VITAMIN D DEFICIENCY 08/28/2009  . Wheezing 07/12/2010    Past Surgical History:  Procedure Laterality Date  . COLONOSCOPY    . ENDOMETRIAL ABLATION    . LAMINECTOMY  1996  . LAPAROTOMY     exploratory  . POLYPECTOMY    . s/p endometrial ablation  12/06  . TUBAL LIGATION    . UTERINE FIBROID SURGERY    . WISDOM TOOTH EXTRACTION      There were no vitals filed for this  visit.  Subjective Assessment - 09/12/17 1711    Subjective  both shlds are bothering me. went to [place] yesterday for conference drive and walking was rough    Currently in Pain?  Yes    Pain Score  5     Pain Location  Shoulder                      [facility] Adult PT Treatment/Exercise - 09/12/17 0001      Lumbar Exercises: Aerobic   Elliptical  Nustep 7 min L 5 LE only    Tread Mill  1.7-2 mph .25 mile  7 min 55 sec      Lumbar Exercises: Machines for Strengthening   Cybex Lumbar Extension  black tband 2 sets 10 ab pull black band 2 sets 10      Lumbar Exercises: Standing   Other Standing Lumbar Exercises  wt ball OH ext and obl 15 times each    Other Standing Lumbar Exercises  red tband hip 3 way 15 times each      Knee/Hip Exercises: Machines for Strengthening   Cybex Knee Extension  10# 2 sets 10    Cybex Knee Flexion  25# 2 sets 10      Moist Heat Therapy   Number Minutes Moist Heat  15 Minutes  Moist Heat Location  Lumbar Spine;Shoulder      Electrical Stimulation   Electrical Stimulation Location  LB    Electrical Stimulation Action  IFC    Electrical Stimulation Parameters  sitting    Electrical Stimulation Goals  Pain               PT Short Term Goals - 08/08/17 1427      PT SHORT TERM GOAL #1   Title  independent with initial HEP    Status  Achieved        PT Long Term Goals - 09/07/17 1049      PT LONG TERM GOAL #1   Title  decrease back pain 50%    Status  Partially Met      PT LONG TERM GOAL #2   Title  decrease left shoulder pain 50%    Status  On-going      PT LONG TERM GOAL #3   Title  walk .5 miles without difficulty    Baseline  1/4 mile    Status  Partially Met      PT LONG TERM GOAL #4   Title  increase AROM of the lumbar spine 25%    Status  Achieved            Plan - 09/12/17 1737    Clinical Impression Statement  TM increased speed and distance in less time. increased core ex today. still  decreasing arm use d/t shlds irritated    PT Treatment/Interventions  ADLs/Self Care Home Management;Cryotherapy;Electrical Stimulation;Iontophoresis '4mg'$ /ml Dexamethasone;Moist Heat;Traction;Ultrasound;Therapeutic activities;Gait training;Stair training;Functional mobility training;Patient/family education;Neuromuscular re-education;Balance training;Therapeutic exercise;Manual techniques;Vasopneumatic Device    PT Next Visit Plan  strength and cardio       Patient will benefit from skilled therapeutic intervention in order to improve the following deficits and impairments:  Abnormal gait, Cardiopulmonary status limiting activity, Decreased activity tolerance, Decreased balance, Decreased mobility, Decreased strength, Increased edema, Improper body mechanics, Impaired flexibility, Pain, Increased muscle spasms, Difficulty walking, Decreased range of motion  Visit Diagnosis: Muscle spasm of back  Difficulty in walking, not elsewhere classified  Acute bilateral low back pain with left-sided sciatica  Muscle weakness (generalized)     Problem List Patient Active Problem List   Diagnosis Date Noted  . Acute shoulder bursitis, left 08/28/2017  . Lumbosacral radiculopathy at L4 06/14/2017  . Easy bruising 03/29/2017  . Left leg paresthesias 03/29/2017  . Muscle cramping 03/20/2017  . 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 Pain in lower jaw 11/10/2016  . Cough 11/06/2016  . Vaginitis 09/17/2016  . Left wrist pain 09/16/2016  . Hypersomnia with sleep apnea 02/15/2016  . 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 Extrinsic asthma 02/15/2016  . Ankle edema 02/15/2016  . Degenerative arthritis of knee, bilateral 01/21/2016  . Asthma with exacerbation 10/20/2015  . Acute sinus infection 09/29/2015  . Cardiomyopathy ([place]) 09/08/2015  . Bilateral calf pain 08/31/2015  . Hyperlipidemia 08/28/2015  . Left knee pain 08/28/2015  . Varicose veins with pain 08/28/2015  . Peripheral edema  08/13/2015  . Injection site reaction 06/02/2015  . PHN (postherpetic neuralgia) 05/29/2015  . Unspecified asthma, with exacerbation 07/01/2014  . Shingles 06/10/2014  . Skin nodule 11/27/2012  . Diabetes ([place]) 01/28/2011  . Encounter for preventative adult health care exam with abnormal findings 01/28/2011  . LIPOMA 10/22/2010  . VITAMIN D DEFICIENCY 08/28/2009  . Hypothyroidism 05/09/2008  . Allergic rhinitis 05/09/2008  . HERPES ZOSTER W/NERVOUS COMPLICATION NEC 58/06/9832  . HYPERTHYROIDISM 05/23/2007  .  OBESITY 05/23/2007  . Anxiety state 05/23/2007  . Asthma 05/23/2007  . ENDOMETRIOSIS NOS 05/23/2007  . INSOMNIA, HX OF 05/23/2007    PAYSEUR,ANGIE PTA 09/12/2017, 5:39 PM  [facility] [address] [place], [place], [address] Phone: [phone]   Fax:  [phone]  Name: Tierany Appleby MRN: [id number] Date of Birth: 1955-12-28

## 2017-09-14 ENCOUNTER — Ambulatory Visit: Payer: BLUE CROSS/BLUE SHIELD | Admitting: Physical Therapy

## 2017-09-14 DIAGNOSIS — M62830 Muscle spasm of back: Secondary | ICD-10-CM | POA: Diagnosis not present

## 2017-09-14 DIAGNOSIS — M5442 Lumbago with sciatica, left side: Secondary | ICD-10-CM

## 2017-09-14 DIAGNOSIS — R262 Difficulty in walking, not elsewhere classified: Secondary | ICD-10-CM

## 2017-09-14 DIAGNOSIS — M6281 Muscle weakness (generalized): Secondary | ICD-10-CM

## 2017-09-14 NOTE — Therapy (Signed)
[facility] [address] [place], [place], [address] Phone: [phone]   Fax:  [phone]  Physical Therapy Treatment  Patient Details  Name: Suzanne Hansen MRN: [id number] Date of Birth: October 22, 1955 Referring Provider: Saintclair Halsted   Encounter Date: 09/14/2017  PT End of Session - 09/14/17 1710    Visit Number  12    Date for PT Re-Evaluation  09/30/17    PT Start Time  1700    PT Stop Time  1755    PT Time Calculation (min)  55 min       Past Medical History:  Diagnosis Date  . ALLERGIC RHINITIS 05/09/2008  . Allergy   . ANXIETY 05/23/2007  . ARM PAIN, LEFT 10/23/2009  . Asthma   . ASTHMA 05/23/2007  . Cardiomyopathy ([place]) 09/08/2015  . CHF (congestive heart failure) ([place])   . CONTACT DERMATITIS 06/09/2009  . Diabetes mellitus   . ENDOMETRIOSIS NOS 05/23/2007  . FACIAL PAIN 06/02/2010  . FREQUENCY, URINARY 05/17/2010  . GLUCOSE INTOLERANCE 08/28/2009  . HERPES SIMPLEX, UNCOMPLICATED 05/04/3663  . Hyperlipidemia 08/28/2015  . HYPERTHYROIDISM 05/23/2007  . Hypoactive thyroid   . HYPOTHYROIDISM 05/09/2008  . Impaired glucose tolerance 01/28/2011  . INSOMNIA, HX OF 05/23/2007  . LATERAL EPICONDYLITIS, RIGHT 03/10/2009  . LIPOMA 10/22/2010  . NECK PAIN 11/10/2010  . OBESITY 05/23/2007  . Plantar fasciitis    Both feet  . SHOULDER PAIN, LEFT 05/17/2010  . SINUSITIS- ACUTE-NOS 11/03/2008  . SKIN LESION 11/10/2010  . Sleep apnea   . Unspecified Chest Pain 07/25/2008  . UNSPECIFIED URTICARIA 06/04/2009  . URI 08/28/2009  . UTI 04/29/2008  . VITAMIN D DEFICIENCY 08/28/2009  . Wheezing 07/12/2010    Past Surgical History:  Procedure Laterality Date  . COLONOSCOPY    . ENDOMETRIAL ABLATION    . LAMINECTOMY  1996  . LAPAROTOMY     exploratory  . POLYPECTOMY    . s/p endometrial ablation  12/06  . TUBAL LIGATION    . UTERINE FIBROID SURGERY    . WISDOM TOOTH EXTRACTION      There were no vitals filed for this  visit.  Subjective Assessment - 09/14/17 1701    Subjective  been in meetings all day so not too bad today, shld still kinda rough    Currently in Pain?  Yes    Pain Score  3     Pain Location  Back                      [facility] Adult PT Treatment/Exercise - 09/14/17 0001      Lumbar Exercises: Aerobic   Elliptical  Nustep 7 min L 5 LE only    Tread Mill  10 min 1.8 mph      Lumbar Exercises: Machines for Strengthening   Cybex Lumbar Extension  black tband 2 sets 15 ab sit up 2 sets 15 black tband      Lumbar Exercises: Standing   Other Standing Lumbar Exercises  wt ball OH ext and obl 15 times each    Other Standing Lumbar Exercises  red tband hip 3 way 15 times each      Knee/Hip Exercises: Standing   Lateral Step Up  15 reps;Hand Hold: 2;Step Height: 6" opp leg abd    Forward Step Up  Hand Hold: 2;Step Height: 6";15 reps opp leg ext      Moist Heat Therapy   Number Minutes Moist Heat  15 Minutes    Moist Heat Location  Lumbar Spine      Electrical Stimulation   Electrical Stimulation Location  LB    Electrical Stimulation Action  IFC    Electrical Stimulation Parameters  sitting    Electrical Stimulation Goals  Pain               PT Short Term Goals - 08/08/17 1427      PT SHORT TERM GOAL #1   Title  independent with initial HEP    Status  Achieved        PT Long Term Goals - 09/14/17 1703      PT LONG TERM GOAL #1   Title  decrease back pain 50%    Baseline  varies    Status  Partially Met      PT LONG TERM GOAL #2   Title  decrease left shoulder pain 50%    Status  Partially Met      PT LONG TERM GOAL #3   Title  walk .5 miles without difficulty    Status  Partially Met      PT LONG TERM GOAL #4   Title  increase AROM of the lumbar spine 25%    Status  Achieved              Patient will benefit from skilled therapeutic intervention in order to improve the following deficits and impairments:     Visit  Diagnosis: Muscle spasm of back  Difficulty in walking, not elsewhere classified  Acute bilateral low back pain with left-sided sciatica  Muscle weakness (generalized)     Problem List Patient Active Problem List   Diagnosis Date Noted  . Acute shoulder bursitis, left 08/28/2017  . Lumbosacral radiculopathy at L4 06/14/2017  . Easy bruising 03/29/2017  . Left leg paresthesias 03/29/2017  . Muscle cramping 03/20/2017  . 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 Pain in lower jaw 11/10/2016  . Cough 11/06/2016  . Vaginitis 09/17/2016  . Left wrist pain 09/16/2016  . Hypersomnia with sleep apnea 02/15/2016  . 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 Extrinsic asthma 02/15/2016  . Ankle edema 02/15/2016  . Degenerative arthritis of knee, bilateral 01/21/2016  . Asthma with exacerbation 10/20/2015  . Acute sinus infection 09/29/2015  . Cardiomyopathy ([place]) 09/08/2015  . Bilateral calf pain 08/31/2015  . Hyperlipidemia 08/28/2015  . Left knee pain 08/28/2015  . Varicose veins with pain 08/28/2015  . Peripheral edema 08/13/2015  . Injection site reaction 06/02/2015  . PHN (postherpetic neuralgia) 05/29/2015  . Unspecified asthma, with exacerbation 07/01/2014  . Shingles 06/10/2014  . Skin nodule 11/27/2012  . Diabetes ([place]) 01/28/2011  . Encounter for preventative adult health care exam with abnormal findings 01/28/2011  . LIPOMA 10/22/2010  . VITAMIN D DEFICIENCY 08/28/2009  . Hypothyroidism 05/09/2008  . Allergic rhinitis 05/09/2008  . HERPES ZOSTER W/NERVOUS COMPLICATION NEC 19/75/8832  . HYPERTHYROIDISM 05/23/2007  . OBESITY 05/23/2007  . Anxiety state 05/23/2007  . Asthma 05/23/2007  . ENDOMETRIOSIS NOS 05/23/2007  . INSOMNIA, HX OF 05/23/2007    Aric Jost,ANGIE PTA 09/14/2017, 5:37 PM  [facility] [address] [place], [place], [address] Phone: [phone]   Fax:  [phone]  Name: Suzanne Hansen MRN: [id number] Date of Birth: 10-19-1955

## 2017-09-19 ENCOUNTER — Ambulatory Visit: Payer: BLUE CROSS/BLUE SHIELD | Admitting: Physical Therapy

## 2017-09-21 ENCOUNTER — Encounter: Payer: Self-pay | Admitting: Physical Therapy

## 2017-09-21 ENCOUNTER — Telehealth: Payer: Self-pay | Admitting: Internal Medicine

## 2017-09-21 ENCOUNTER — Ambulatory Visit: Payer: BLUE CROSS/BLUE SHIELD | Admitting: Physical Therapy

## 2017-09-21 ENCOUNTER — Other Ambulatory Visit: Payer: Self-pay | Admitting: *Deleted

## 2017-09-21 ENCOUNTER — Other Ambulatory Visit: Payer: Self-pay | Admitting: Internal Medicine

## 2017-09-21 DIAGNOSIS — M6281 Muscle weakness (generalized): Secondary | ICD-10-CM

## 2017-09-21 DIAGNOSIS — R262 Difficulty in walking, not elsewhere classified: Secondary | ICD-10-CM

## 2017-09-21 DIAGNOSIS — M62830 Muscle spasm of back: Secondary | ICD-10-CM

## 2017-09-21 DIAGNOSIS — M5442 Lumbago with sciatica, left side: Secondary | ICD-10-CM

## 2017-09-21 MED ORDER — ALBUTEROL SULFATE HFA 108 (90 BASE) MCG/ACT IN AERS: INHALATION_SPRAY | RESPIRATORY_TRACT | 1 refills | Status: DC

## 2017-09-21 MED ORDER — PREDNISONE 10 MG PO TABS: ORAL_TABLET | ORAL | 0 refills | Status: DC

## 2017-09-21 NOTE — Telephone Encounter (Signed)
Copied from [place]. Topic: Quick Communication - Rx Refill/Question >> Sep 21, 2017  9:07 AM Synthia Innocent wrote: Has the patient contacted their pharmacy? Yes.     (Agent: If no, request that the patient contact the pharmacy for the refill.)   Preferred Pharmacy (with phone number or street name): [facility] [address]: Please be advised that RX refills may take up to 3 business days. We ask that you follow-up with your pharmacy.  Requesting refill on albuterol (PROAIR HFA) 108 (90 Base) MCG/ACT inhaler

## 2017-09-21 NOTE — Telephone Encounter (Signed)
Per chart MD assistant has already approved the [facility]erol have [facility] sent [facility].../lmb  Copied from [place]. Topic: Quick Communication - Rx Refill/Question >> Sep 21, 2017  9:07 AM Synthia Innocent wrote: Has the patient contacted their pharmacy? Yes.     (Agent: If no, request that the patient contact the pharmacy for the refill.)   Preferred Pharmacy (with phone number or street name): [facility] [address]: Please be advised that RX refills may take up to 3 business days. We ask that you follow-up with your pharmacy.  Requesting refill on albuterol [facility] HFA) 108 (90 Base) MCG/ACT inhaler  >> Sep 21, 2017  1:25 PM Carolyn Stare wrote:    [facility] [address] [facility]

## 2017-09-21 NOTE — Telephone Encounter (Signed)
Prednisone and proair done erx

## 2017-09-21 NOTE — Therapy (Signed)
[facility] [address] [place], [place], [address] Phone: [phone]   Fax:  [phone]  Physical Therapy Treatment  Patient Details  Name: Suzanne Hansen MRN: [id number] Date of Birth: [birth year] Referring Provider: Saintclair Halsted   Encounter Date: 09/21/2017  PT End of Session - 09/21/17 1737    Visit Number  13    Date for PT Re-Evaluation  09/30/17    PT Start Time  1656    PT Stop Time  1750    PT Time Calculation (min)  54 min       Past Medical History:  Diagnosis Date  . ALLERGIC RHINITIS 05/09/2008  . Allergy   . ANXIETY 05/23/2007  . ARM PAIN, LEFT 10/23/2009  . Asthma   . ASTHMA 05/23/2007  . Cardiomyopathy ([place]) 09/08/2015  . CHF (congestive heart failure) ([place])   . CONTACT DERMATITIS 06/09/2009  . Diabetes mellitus   . ENDOMETRIOSIS NOS 05/23/2007  . FACIAL PAIN 06/02/2010  . FREQUENCY, URINARY 05/17/2010  . GLUCOSE INTOLERANCE 08/28/2009  . HERPES SIMPLEX, UNCOMPLICATED 9/32/3557  . Hyperlipidemia 08/28/2015  . HYPERTHYROIDISM 05/23/2007  . Hypoactive thyroid   . HYPOTHYROIDISM 05/09/2008  . Impaired glucose tolerance 01/28/2011  . INSOMNIA, HX OF 05/23/2007  . LATERAL EPICONDYLITIS, RIGHT 03/10/2009  . LIPOMA 10/22/2010  . NECK PAIN 11/10/2010  . OBESITY 05/23/2007  . Plantar fasciitis    Both feet  . SHOULDER PAIN, LEFT 05/17/2010  . SINUSITIS- ACUTE-NOS 11/03/2008  . SKIN LESION 11/10/2010  . Sleep apnea   . Unspecified Chest Pain 07/25/2008  . UNSPECIFIED URTICARIA 06/04/2009  . URI 08/28/2009  . UTI 04/29/2008  . VITAMIN D DEFICIENCY 08/28/2009  . Wheezing 07/12/2010    Past Surgical History:  Procedure Laterality Date  . COLONOSCOPY    . ENDOMETRIAL ABLATION    . LAMINECTOMY  1996  . LAPAROTOMY     exploratory  . POLYPECTOMY    . s/p endometrial ablation  12/06  . TUBAL LIGATION    . UTERINE FIBROID SURGERY    . WISDOM TOOTH EXTRACTION      There were no vitals filed for this  visit.  Subjective Assessment - 09/21/17 1703    Subjective  rough with all this bad weather    Currently in Pain?  Yes    Pain Score  4     Pain Location  Back                      [facility] Adult PT Treatment/Exercise - 09/21/17 0001      Lumbar Exercises: Aerobic   Elliptical  Nustep 7 min L 5 LE only    Tread Mill  .25 mile 8 min      Lumbar Exercises: Supine   Ab Set  10 reps;3 seconds    Bridge  10 reps with ball, KTC and obl      Knee/Hip Exercises: Standing   Other Standing Knee Exercises  red tband hip ext and abd 15 each       Knee/Hip Exercises: Seated   Ball Squeeze  20 reps    Other Seated Knee/Hip Exercises  hip abd and hip flex green tband 15 times each      Moist Heat Therapy   Number Minutes Moist Heat  15 Minutes    Moist Heat Location  Lumbar Spine      Electrical Stimulation   Electrical Stimulation Location  LB    Electrical Stimulation Action  IFC    Electrical Stimulation Parameters  sitting    Electrical Stimulation Goals  Pain               PT Short Term Goals - 08/08/17 1427      PT SHORT TERM GOAL #1   Title  independent with initial HEP    Status  Achieved        PT Long Term Goals - 09/21/17 1738      PT LONG TERM GOAL #1   Title  decrease back pain 50%    Status  Partially Met      PT LONG TERM GOAL #2   Title  decrease left shoulder pain 50%    Status  Partially Met      PT LONG TERM GOAL #3   Title  walk .5 miles without difficulty    Status  Partially Met      PT LONG TERM GOAL #4   Title  increase AROM of the lumbar spine 25%    Status  Achieved            Plan - 09/21/17 1737    Clinical Impression Statement  pt amb 1/4 mile in 2 min less than last week. limited tolerance to supine core ex. progressing with goals. discussed home ex and/or our gym program    PT Treatment/Interventions  ADLs/Self Care Home Management;Cryotherapy;Electrical Stimulation;Iontophoresis '4mg'$ /ml Dexamethasone;Moist  Heat;Traction;Ultrasound;Therapeutic activities;Gait training;Stair training;Functional mobility training;Patient/family education;Neuromuscular re-education;Balance training;Therapeutic exercise;Manual techniques;Vasopneumatic Device    PT Next Visit Plan  strength and cardio       Patient will benefit from skilled therapeutic intervention in order to improve the following deficits and impairments:  Abnormal gait, Cardiopulmonary status limiting activity, Decreased activity tolerance, Decreased balance, Decreased mobility, Decreased strength, Increased edema, Improper body mechanics, Impaired flexibility, Pain, Increased muscle spasms, Difficulty walking, Decreased range of motion  Visit Diagnosis: Muscle spasm of back  Difficulty in walking, not elsewhere classified  Acute bilateral low back pain with left-sided sciatica  Muscle weakness (generalized)     Problem List Patient Active Problem List   Diagnosis Date Noted  . Acute shoulder bursitis, left 08/28/2017  . Lumbosacral radiculopathy at L4 06/14/2017  . Easy bruising 03/29/2017  . Left leg paresthesias 03/29/2017  . Muscle cramping 03/20/2017  . 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 Pain in lower jaw 11/10/2016  . Cough 11/06/2016  . Vaginitis 09/17/2016  . Left wrist pain 09/16/2016  . Hypersomnia with sleep apnea 02/15/2016  . 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 Extrinsic asthma 02/15/2016  . Ankle edema 02/15/2016  . Degenerative arthritis of knee, bilateral 01/21/2016  . Asthma with exacerbation 10/20/2015  . Acute sinus infection 09/29/2015  . Cardiomyopathy ([place]) 09/08/2015  . Bilateral calf pain 08/31/2015  . Hyperlipidemia 08/28/2015  . Left knee pain 08/28/2015  . Varicose veins with pain 08/28/2015  . Peripheral edema 08/13/2015  . Injection site reaction 06/02/2015  . PHN (postherpetic neuralgia) 05/29/2015  . Unspecified asthma, with exacerbation 07/01/2014  . Shingles 06/10/2014  . Skin  nodule 11/27/2012  . Diabetes ([place]) 01/28/2011  . Encounter for preventative adult health care exam with abnormal findings 01/28/2011  . LIPOMA 10/22/2010  . VITAMIN D DEFICIENCY 08/28/2009  . Hypothyroidism 05/09/2008  . Allergic rhinitis 05/09/2008  . HERPES ZOSTER W/NERVOUS COMPLICATION NEC 20/25/4270  . HYPERTHYROIDISM 05/23/2007  . OBESITY 05/23/2007  . Anxiety state 05/23/2007  . Asthma 05/23/2007  . ENDOMETRIOSIS NOS 05/23/2007  . INSOMNIA, HX OF 05/23/2007  Kamilya Wakeman,ANGIE PTA 09/21/2017, 5:39 PM  [facility] [address] [place], [place], [address] Phone: [phone]   Fax:  [phone]  Name: Suzanne Hansen MRN: [id number] Date of Birth: Jul 12, 1956

## 2017-09-23 NOTE — Progress Notes (Signed)
[name] [address] [place], [place] [address] Phone: [phone] Subjective:    CC: Left shoulder pain  ZYS:[id number]  Suzanne Hansen is a 61 y.o. female coming in with complaint of left shoulder pain.  Patient was found to have mild bursitis after patient had her back surgery.  Patient was given an injection August 28, 2017 and was to home exercises.  Patient states she is 99% better.  Feels like she is making progress.  Still pain at night but not taking gabapentin.       Past Medical History:  Diagnosis Date  . ALLERGIC RHINITIS 05/09/2008  . Allergy   . ANXIETY 05/23/2007  . ARM PAIN, LEFT 10/23/2009  . Asthma   . ASTHMA 05/23/2007  . Cardiomyopathy ([place]) 09/08/2015  . CHF (congestive heart failure) ([place])   . CONTACT DERMATITIS 06/09/2009  . Diabetes mellitus   . ENDOMETRIOSIS NOS 05/23/2007  . FACIAL PAIN 06/02/2010  . FREQUENCY, URINARY 05/17/2010  . GLUCOSE INTOLERANCE 08/28/2009  . HERPES SIMPLEX, UNCOMPLICATED 12/31/5571  . Hyperlipidemia 08/28/2015  . HYPERTHYROIDISM 05/23/2007  . Hypoactive thyroid   . HYPOTHYROIDISM 05/09/2008  . Impaired glucose tolerance 01/28/2011  . INSOMNIA, HX OF 05/23/2007  . LATERAL EPICONDYLITIS, RIGHT 03/10/2009  . LIPOMA 10/22/2010  . NECK PAIN 11/10/2010  . OBESITY 05/23/2007  . Plantar fasciitis    Both feet  . SHOULDER PAIN, LEFT 05/17/2010  . SINUSITIS- ACUTE-NOS 11/03/2008  . SKIN LESION 11/10/2010  . Sleep apnea   . Unspecified Chest Pain 07/25/2008  . UNSPECIFIED URTICARIA 06/04/2009  . URI 08/28/2009  . UTI 04/29/2008  . VITAMIN D DEFICIENCY 08/28/2009  . Wheezing 07/12/2010   Past Surgical History:  Procedure Laterality Date  . COLONOSCOPY    . ENDOMETRIAL ABLATION    . LAMINECTOMY  1996  . LAPAROTOMY     exploratory  . POLYPECTOMY    . s/p endometrial ablation  12/06  . TUBAL LIGATION    . UTERINE FIBROID SURGERY    . WISDOM TOOTH EXTRACTION     Social History   Socioeconomic History  .  Marital status: Divorced    Spouse name: None  . Number of children: 3  . Years of education: None  . Highest education level: None  Social Needs  . Financial resource strain: None  . Food insecurity - worry: None  . Food insecurity - inability: None  . Transportation needs - medical: None  . Transportation needs - non-medical: None  Occupational History  . Occupation: SOCIAL WORKER    Employer: [employer]    Comment: [place]  Tobacco Use  . Smoking status: Former Smoker    Packs/day: 0.50    Types: Cigarettes    Last attempt to quit: 11/05/1987    Years since quitting: 29.9  . Smokeless tobacco: Never Used  Substance and Sexual Activity  . Alcohol use: No    Alcohol/week: 0.6 oz    Types: 1 Glasses of wine per week    Comment: WINE  . Drug use: No  . Sexual activity: None  Other Topics Concern  . None  Social History Narrative  . None   Allergies  Allergen Reactions  . Ivp Dye [Iodinated Diagnostic Agents] Shortness Of Breath and Other (See Comments)    Swelling of extremity of the injection site  . Latex Itching    ITCHING AND COLD SORES AROUND MOUTH WHEN LATEX GLOVES USED BY THE DENTIST/HYGENTIST  . Clarithromycin Nausea And Vomiting  REACTION: nausea  vomiting  . Metronidazole Nausea And Vomiting   Family History  Problem Relation Age of Onset  . Diabetes Cousin   . Hypertension Cousin   . Heart disease Cousin   . Heart disease Mother        Rheumatic heart disease  . COPD Father   . Hypertension Father   . Cancer Maternal Grandmother        Breast  . Diabetes Sister   . Kidney disease Sister      Past medical history, social, surgical and family history all reviewed in electronic medical record.  No pertanent information unless stated regarding to the chief complaint.   Review of Systems:Review of systems updated and as accurate as of 09/25/17  No headache, visual changes, nausea, vomiting, diarrhea, constipation, dizziness,  abdominal pain, skin rash, fevers, chills, night sweats, weight loss, swollen lymph nodes, body aches, joint swelling, chest pain, shortness of breath, mood changes.  Positive muscle aches  Objective  Blood pressure 122/80, pulse 93, height 5\' 4"  (1.626 m), weight 244 lb (110.7 kg), SpO2 90 %. Systems examined below as of 09/25/17   General: No apparent distress alert and oriented x3 mood and affect normal, dressed appropriately.  HEENT: Pupils equal, extraocular movements intact  Respiratory: Patient's speak in full sentences and does not appear short of breath  Cardiovascular: No lower extremity edema, non tender, no erythema  Skin: Warm dry intact with no signs of infection or rash on extremities or on axial skeleton.  Abdomen: Soft nontender  Neuro: Cranial nerves II through XII are intact, neurovascularly intact in all extremities with 2+ DTRs and 2+ pulses.  Lymph: No lymphadenopathy of posterior or anterior cervical chain or axillae bilaterally.  Gait normal with good balance and coordination.  MSK:  Non tender with full range of motion and good stability and symmetric strength and tone of  elbows, wrist, hip, knee and ankles bilaterally.  Neck exam shows the patient does have lipodystrophy surrounding both shoulders in the posterior aspect of the neck Left shoulder still shows some mild impingement but seems to be symmetric to the contralateral side.  Patient has mild positive O'Brien's bilaterally. Contralateral shoulder mild impingement as well    Impression and Recommendations:     This case required medical decision making of moderate complexity.      Note: This dictation was prepared with Dragon dictation along with smaller phrase technology. Any transcriptional errors that result from this process are unintentional.

## 2017-09-25 ENCOUNTER — Ambulatory Visit (INDEPENDENT_AMBULATORY_CARE_PROVIDER_SITE_OTHER): Payer: BLUE CROSS/BLUE SHIELD | Admitting: Family Medicine

## 2017-09-25 ENCOUNTER — Encounter: Payer: Self-pay | Admitting: Family Medicine

## 2017-09-25 DIAGNOSIS — M7552 Bursitis of left shoulder: Secondary | ICD-10-CM | POA: Diagnosis not present

## 2017-09-25 NOTE — Patient Instructions (Signed)
Good to see you as always Made my christmas with you getting better Try the pennsaid and gabapentin at night Stay active Keep hands within peripheral vision  See me again in 6 weeks if not perfect  Happy New Year!

## 2017-09-25 NOTE — Assessment & Plan Note (Signed)
Improved after injection.  Patient still declines any type of referral to physical therapy.  We discussed icing regimen and home exercise, we discussed which activities of doing which wants to avoid.  Patient will increase activity slowly over the course of the next several days.  Follow-up with me again in 6 weeks if not completely resolved

## 2017-09-26 ENCOUNTER — Ambulatory Visit: Payer: BLUE CROSS/BLUE SHIELD | Admitting: Physical Therapy

## 2017-09-26 DIAGNOSIS — M6281 Muscle weakness (generalized): Secondary | ICD-10-CM

## 2017-09-26 DIAGNOSIS — M62830 Muscle spasm of back: Secondary | ICD-10-CM

## 2017-09-26 DIAGNOSIS — M5442 Lumbago with sciatica, left side: Secondary | ICD-10-CM

## 2017-09-26 DIAGNOSIS — R262 Difficulty in walking, not elsewhere classified: Secondary | ICD-10-CM

## 2017-09-26 NOTE — Therapy (Signed)
[facility] [address] [place], [place], [address] Phone: [phone]   Fax:  [phone]  Physical Therapy Treatment  Patient Details  Name: Suzanne Hansen MRN: [id number] Date of Birth: [birth year] Referring Provider: Saintclair Halsted   Encounter Date: 09/26/2017  PT End of Session - 09/26/17 3762    Visit Number  14    Date for PT Re-Evaluation  09/30/17    PT Start Time  1400    PT Stop Time  1500    PT Time Calculation (min)  60 min       Past Medical History:  Diagnosis Date  . ALLERGIC RHINITIS 05/09/2008  . Allergy   . ANXIETY 05/23/2007  . ARM PAIN, LEFT 10/23/2009  . Asthma   . ASTHMA 05/23/2007  . Cardiomyopathy ([place]) 09/08/2015  . CHF (congestive heart failure) ([place])   . CONTACT DERMATITIS 06/09/2009  . Diabetes mellitus   . ENDOMETRIOSIS NOS 05/23/2007  . FACIAL PAIN 06/02/2010  . FREQUENCY, URINARY 05/17/2010  . GLUCOSE INTOLERANCE 08/28/2009  . HERPES SIMPLEX, UNCOMPLICATED 06/10/5175  . Hyperlipidemia 08/28/2015  . HYPERTHYROIDISM 05/23/2007  . Hypoactive thyroid   . HYPOTHYROIDISM 05/09/2008  . Impaired glucose tolerance 01/28/2011  . INSOMNIA, HX OF 05/23/2007  . LATERAL EPICONDYLITIS, RIGHT 03/10/2009  . LIPOMA 10/22/2010  . NECK PAIN 11/10/2010  . OBESITY 05/23/2007  . Plantar fasciitis    Both feet  . SHOULDER PAIN, LEFT 05/17/2010  . SINUSITIS- ACUTE-NOS 11/03/2008  . SKIN LESION 11/10/2010  . Sleep apnea   . Unspecified Chest Pain 07/25/2008  . UNSPECIFIED URTICARIA 06/04/2009  . URI 08/28/2009  . UTI 04/29/2008  . VITAMIN D DEFICIENCY 08/28/2009  . Wheezing 07/12/2010    Past Surgical History:  Procedure Laterality Date  . COLONOSCOPY    . ENDOMETRIAL ABLATION    . LAMINECTOMY  1996  . LAPAROTOMY     exploratory  . POLYPECTOMY    . s/p endometrial ablation  12/06  . TUBAL LIGATION    . UTERINE FIBROID SURGERY    . WISDOM TOOTH EXTRACTION      There were no vitals filed for this  visit.  Subjective Assessment - 09/26/17 1400    Subjective  doing okay, woke up alittle sore.    Currently in Pain?  Yes    Pain Score  3     Pain Location  Back                      [facility] Adult PT Treatment/Exercise - 09/26/17 0001      Lumbar Exercises: Aerobic   Elliptical  Nustep 8 min L 5 LE only    Tread Mill  2 mph  .33 10 min      Lumbar Exercises: Machines for Strengthening   Cybex Lumbar Extension  black tband 2 sets 15    Other Lumbar Machine Exercise  seated row 15# 2 sets 15      Lumbar Exercises: Standing   Other Standing Lumbar Exercises  wt ball OH ext and obl 15 times each    Other Standing Lumbar Exercises  green tband hip 3 way 15 times each      Moist Heat Therapy   Number Minutes Moist Heat  15 Minutes    Moist Heat Location  Lumbar Spine      Electrical Stimulation   Electrical Stimulation Location  LB    Electrical Stimulation Action  IFC    Electrical Stimulation Parameters  sitting    Electrical Stimulation Goals  Pain               PT Short Term Goals - 08/08/17 1427      PT SHORT TERM GOAL #1   Title  independent with initial HEP    Status  Achieved        PT Long Term Goals - 09/21/17 1738      PT LONG TERM GOAL #1   Title  decrease back pain 50%    Status  Partially Met      PT LONG TERM GOAL #2   Title  decrease left shoulder pain 50%    Status  Partially Met      PT LONG TERM GOAL #3   Title  walk .5 miles without difficulty    Status  Partially Met      PT LONG TERM GOAL #4   Title  increase AROM of the lumbar spine 25%    Status  Achieved            Plan - 09/26/17 1442    Clinical Impression Statement  walked .32 mile in 10 min. Speed consistantly getting better with increased distance. 18 min cardio today. hip weakness with standing ex    PT Treatment/Interventions  ADLs/Self Care Home Management;Cryotherapy;Electrical Stimulation;Iontophoresis '4mg'$ /ml Dexamethasone;Moist  Heat;Traction;Ultrasound;Therapeutic activities;Gait training;Stair training;Functional mobility training;Patient/family education;Neuromuscular re-education;Balance training;Therapeutic exercise;Manual techniques;Vasopneumatic Device    PT Next Visit Plan  strength and cardio       Patient will benefit from skilled therapeutic intervention in order to improve the following deficits and impairments:  Abnormal gait, Cardiopulmonary status limiting activity, Decreased activity tolerance, Decreased balance, Decreased mobility, Decreased strength, Increased edema, Improper body mechanics, Impaired flexibility, Pain, Increased muscle spasms, Difficulty walking, Decreased range of motion  Visit Diagnosis: Muscle spasm of back  Difficulty in walking, not elsewhere classified  Acute bilateral low back pain with left-sided sciatica  Muscle weakness (generalized)     Problem List Patient Active Problem List   Diagnosis Date Noted  . Acute shoulder bursitis, left 08/28/2017  . Lumbosacral radiculopathy at L4 06/14/2017  . Easy bruising 03/29/2017  . Left leg paresthesias 03/29/2017  . Muscle cramping 03/20/2017  . 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 Pain in lower jaw 11/10/2016  . Cough 11/06/2016  . Vaginitis 09/17/2016  . Left wrist pain 09/16/2016  . Hypersomnia with sleep apnea 02/15/2016  . 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 Extrinsic asthma 02/15/2016  . Ankle edema 02/15/2016  . Degenerative arthritis of knee, bilateral 01/21/2016  . Asthma with exacerbation 10/20/2015  . Acute sinus infection 09/29/2015  . Cardiomyopathy ([place]) 09/08/2015  . Bilateral calf pain 08/31/2015  . Hyperlipidemia 08/28/2015  . Left knee pain 08/28/2015  . Varicose veins with pain 08/28/2015  . Peripheral edema 08/13/2015  . Injection site reaction 06/02/2015  . PHN (postherpetic neuralgia) 05/29/2015  . Unspecified asthma, with exacerbation 07/01/2014  . Shingles 06/10/2014  . Skin  nodule 11/27/2012  . Diabetes ([place]) 01/28/2011  . Encounter for preventative adult health care exam with abnormal findings 01/28/2011  . LIPOMA 10/22/2010  . VITAMIN D DEFICIENCY 08/28/2009  . Hypothyroidism 05/09/2008  . Allergic rhinitis 05/09/2008  . HERPES ZOSTER W/NERVOUS COMPLICATION NEC 91/47/8295  . HYPERTHYROIDISM 05/23/2007  . OBESITY 05/23/2007  . Anxiety state 05/23/2007  . Asthma 05/23/2007  . ENDOMETRIOSIS NOS 05/23/2007  . INSOMNIA, HX OF 05/23/2007    Babetta Paterson,ANGIE PTA 09/26/2017, 2:44 PM  [facility]  [address] [place], [place], [address] Phone: [phone]   Fax:  [phone]  Name: Suzanne Hansen MRN: [id number] Date of Birth: [birth year]

## 2017-09-27 ENCOUNTER — Encounter: Payer: Self-pay | Admitting: Internal Medicine

## 2017-09-27 ENCOUNTER — Other Ambulatory Visit (INDEPENDENT_AMBULATORY_CARE_PROVIDER_SITE_OTHER): Payer: BLUE CROSS/BLUE SHIELD

## 2017-09-27 ENCOUNTER — Ambulatory Visit (INDEPENDENT_AMBULATORY_CARE_PROVIDER_SITE_OTHER): Payer: BLUE CROSS/BLUE SHIELD | Admitting: Internal Medicine

## 2017-09-27 VITALS — BP 126/82 | HR 98 | Temp 97.7°F | Ht 64.0 in | Wt 242.0 lb

## 2017-09-27 DIAGNOSIS — K219 Gastro-esophageal reflux disease without esophagitis: Secondary | ICD-10-CM | POA: Insufficient documentation

## 2017-09-27 DIAGNOSIS — E119 Type 2 diabetes mellitus without complications: Secondary | ICD-10-CM

## 2017-09-27 DIAGNOSIS — Z0001 Encounter for general adult medical examination with abnormal findings: Secondary | ICD-10-CM

## 2017-09-27 DIAGNOSIS — E785 Hyperlipidemia, unspecified: Secondary | ICD-10-CM

## 2017-09-27 DIAGNOSIS — Z23 Encounter for immunization: Secondary | ICD-10-CM

## 2017-09-27 DIAGNOSIS — E669 Obesity, unspecified: Secondary | ICD-10-CM | POA: Diagnosis not present

## 2017-09-27 DIAGNOSIS — E039 Hypothyroidism, unspecified: Secondary | ICD-10-CM

## 2017-09-27 DIAGNOSIS — R739 Hyperglycemia, unspecified: Secondary | ICD-10-CM | POA: Insufficient documentation

## 2017-09-27 LAB — BASIC METABOLIC PANEL
BUN: 22 mg/dL (ref 6–23)
CHLORIDE: 101 meq/L (ref 96–112)
CO2: 29 meq/L (ref 19–32)
CREATININE: 1.24 mg/dL — AB (ref 0.40–1.20)
Calcium: 9.7 mg/dL (ref 8.4–10.5)
GFR: 56.65 mL/min — ABNORMAL LOW (ref >60.00)
GLUCOSE: 105 mg/dL — AB (ref 70–99)
POTASSIUM: 3.9 meq/L (ref 3.5–5.1)
Sodium: 139 mEq/L (ref 135–145)

## 2017-09-27 LAB — HEPATIC FUNCTION PANEL
ALT: 27 U/L (ref 0–35)
AST: 21 U/L (ref 0–37)
Albumin: 4.1 g/dL (ref 3.5–5.2)
Alkaline Phosphatase: 81 U/L (ref 39–117)
BILIRUBIN TOTAL: 0.8 mg/dL (ref 0.2–1.2)
Bilirubin, Direct: 0.2 mg/dL (ref 0.0–0.3)
Total Protein: 7.4 g/dL (ref 6.0–8.3)

## 2017-09-27 LAB — CBC WITH DIFFERENTIAL/PLATELET
BASOS PCT: 0.7 % (ref 0.0–3.0)
Basophils Absolute: 0.1 10*3/uL (ref 0.0–0.1)
EOS PCT: 0.4 % (ref 0.0–5.0)
Eosinophils Absolute: 0 10*3/uL (ref 0.0–0.7)
HCT: 40.3 % (ref 36.0–46.0)
HEMOGLOBIN: 13.4 g/dL (ref 12.0–15.0)
LYMPHS ABS: 2.6 10*3/uL (ref 0.7–4.0)
Lymphocytes Relative: 31.7 % (ref 12.0–46.0)
MCHC: 33.2 g/dL (ref 30.0–36.0)
MCV: 90.8 fl (ref 78.0–100.0)
MONO ABS: 0.7 10*3/uL (ref 0.1–1.0)
Monocytes Relative: 7.8 % (ref 3.0–12.0)
Neutro Abs: 4.9 10*3/uL (ref 1.4–7.7)
Neutrophils Relative %: 59.4 % (ref 43.0–77.0)
Platelets: 265 10*3/uL (ref 150.0–400.0)
RBC: 4.43 Mil/uL (ref 3.87–5.11)
RDW: 13.8 % (ref 11.5–15.5)
WBC: 8.3 10*3/uL (ref 4.0–10.5)

## 2017-09-27 LAB — URINALYSIS, ROUTINE W REFLEX MICROSCOPIC
Bilirubin Urine: NEGATIVE
Hgb urine dipstick: NEGATIVE
KETONES UR: NEGATIVE
Leukocytes, UA: NEGATIVE
Nitrite: NEGATIVE
PH: 6 (ref 5.0–8.0)
SPECIFIC GRAVITY, URINE: 1.02 (ref 1.000–1.030)
TOTAL PROTEIN, URINE-UPE24: NEGATIVE
URINE GLUCOSE: NEGATIVE
Urobilinogen, UA: 0.2 (ref 0.0–1.0)

## 2017-09-27 LAB — LIPID PANEL
CHOL/HDL RATIO: 2
Cholesterol: 119 mg/dL (ref 0–200)
HDL: 77.5 mg/dL (ref >39.00)
LDL CALC: 29 mg/dL (ref 0–99)
NONHDL: 41.66
Triglycerides: 61 mg/dL (ref 0.0–149.0)
VLDL: 12.2 mg/dL (ref 0.0–40.0)

## 2017-09-27 LAB — HEMOGLOBIN A1C: HEMOGLOBIN A1C: 6 % (ref 4.6–6.5)

## 2017-09-27 LAB — TSH: TSH: 2.49 u[IU]/mL (ref 0.35–4.50)

## 2017-09-27 MED ORDER — OMEPRAZOLE 20 MG PO CPDR: 20.0000 mg | DELAYED_RELEASE_CAPSULE | Freq: Every day | ORAL | 3 refills | Status: DC

## 2017-09-27 NOTE — Progress Notes (Signed)
Subjective:    Patient ID: Suzanne Hansen, female    DOB: [birth year], 61 y.o.   MRN: [id number]  HPI  Here for wellness and f/u;  Overall doing ok;  Pt denies Chest pain, worsening SOB, DOE, wheezing, orthopnea, PND, worsening LE edema, palpitations, dizziness or syncope.  Pt denies neurological change such as new headache, facial or extremity weakness.  Pt denies polydipsia, polyuria, or low sugar symptoms. Pt states overall good compliance with treatment and medications, good tolerability, and has been trying to follow appropriate diet.  Pt denies worsening depressive symptoms, suicidal ideation or panic. No fever, night sweats, wt loss, loss of appetite, or other constitutional symptoms.  Pt states good ability with ADL's, has low fall risk, home safety reviewed and adequate, no other significant changes in hearing or vision, and only occasionally active with exercise. Also has had mild worsening reflux, without abd pain, dysphagia, n/v, bowel change or blood, also without wt loss or reduced appetite.  Also d/w pt regarding current obesity in light of mult risk factors, as she would like to consider wt loss surgury.  Also mentions overal back pain improved, to finish PT last visit tomorrow, and f/u Dr Saintclair Halsted 11/2017 BP Readings from Last 3 Encounters:  09/27/17 126/82  09/25/17 122/80  08/28/17 118/82   Wt Readings from Last 3 Encounters:  09/27/17 242 lb (109.8 kg)  09/25/17 244 lb (110.7 kg)  08/28/17 245 lb (111.1 kg)   Past Medical History:  Diagnosis Date  . ALLERGIC RHINITIS 05/09/2008  . Allergy   . ANXIETY 05/23/2007  . ARM PAIN, LEFT 10/23/2009  . Asthma   . ASTHMA 05/23/2007  . Cardiomyopathy ([place]) 09/08/2015  . CHF (congestive heart failure) ([place])   . CONTACT DERMATITIS 06/09/2009  . Diabetes mellitus   . ENDOMETRIOSIS NOS 05/23/2007  . FACIAL PAIN 06/02/2010  . FREQUENCY, URINARY 05/17/2010  . GLUCOSE INTOLERANCE 08/28/2009  . HERPES SIMPLEX, UNCOMPLICATED 01/26/6221  .  Hyperlipidemia 08/28/2015  . HYPERTHYROIDISM 05/23/2007  . Hypoactive thyroid   . HYPOTHYROIDISM 05/09/2008  . Impaired glucose tolerance 01/28/2011  . INSOMNIA, HX OF 05/23/2007  . LATERAL EPICONDYLITIS, RIGHT 03/10/2009  . LIPOMA 10/22/2010  . NECK PAIN 11/10/2010  . OBESITY 05/23/2007  . Plantar fasciitis    Both feet  . SHOULDER PAIN, LEFT 05/17/2010  . SINUSITIS- ACUTE-NOS 11/03/2008  . SKIN LESION 11/10/2010  . Sleep apnea   . Unspecified Chest Pain 07/25/2008  . UNSPECIFIED URTICARIA 06/04/2009  . URI 08/28/2009  . UTI 04/29/2008  . VITAMIN D DEFICIENCY 08/28/2009  . Wheezing 07/12/2010   Past Surgical History:  Procedure Laterality Date  . COLONOSCOPY    . ENDOMETRIAL ABLATION    . LAMINECTOMY  1996  . LAPAROTOMY     exploratory  . POLYPECTOMY    . s/p endometrial ablation  12/06  . TUBAL LIGATION    . UTERINE FIBROID SURGERY    . WISDOM TOOTH EXTRACTION      reports that she quit smoking about 29 years ago. Her smoking use included cigarettes. She smoked 0.50 packs per day. she has never used smokeless tobacco. She reports that she does not drink alcohol or use drugs. family history includes COPD in her father; Cancer in her maternal grandmother; Diabetes in her cousin and sister; Heart disease in her cousin and mother; Hypertension in her cousin and father; Kidney disease in her sister. Allergies  Allergen Reactions  . Ivp Dye [Iodinated Diagnostic Agents] Shortness Of Breath and Other (See Comments)  Swelling of extremity of the injection site  . Latex Itching    ITCHING AND COLD SORES AROUND MOUTH WHEN LATEX GLOVES USED BY THE DENTIST/HYGENTIST  . Clarithromycin Nausea And Vomiting    REACTION: nausea  vomiting  . Metronidazole Nausea And Vomiting  . Zostavax [Zoster Vaccine Live]    Current Outpatient Medications on File Prior to Visit  Medication Sig Dispense Refill  . albuterol (PROAIR HFA) 108 (90 Base) MCG/ACT inhaler INHALE 2 PUFFS INTO THE LUNGS EVERY 4 (FOUR)  HOURS AS NEEDED FOR WHEEZING OR SHORTNESS OF BREATH. 8.5 Inhaler 1  . aspirin 81 MG EC tablet Take 81 mg by mouth daily.      . carvedilol (COREG) 3.125 MG tablet Take 1 tablet (3.125 mg total) by mouth 2 (two) times daily. 60 tablet 0  . chlorpheniramine-HYDROcodone (TUSSIONEX PENNKINETIC ER) 10-8 MG/5ML SUER Take 5 mLs every 12 (twelve) hours as needed by mouth for cough. 140 mL 0  . Cholecalciferol (VITAMIN D) 2000 UNITS CAPS Take 2 capsules by mouth daily.    . Diclofenac Sodium (PENNSAID) 2 % SOLN Place 2 application 2 (two) times daily onto the skin. 112 g 3  . Diclofenac Sodium 2 % SOLN Place 1 application onto the skin 2 (two) times daily. 112 g 3  . fexofenadine (ALLEGRA) 180 MG tablet Take 180 mg by mouth daily.      . furosemide (LASIX) 40 MG tablet TAKE 1 TABLET (40 MG TOTAL) BY MOUTH DAILY. (Patient taking differently: 60 mg 2 (two) times daily. TAKE 1 TABLET (40 MG TOTAL) BY MOUTH DAILY.) 90 tablet 3  . gabapentin (NEURONTIN) 300 MG capsule Take 1 capsule (300 mg total) by mouth 3 (three) times daily. 90 capsule 5  . levothyroxine (SYNTHROID, LEVOTHROID) 112 MCG tablet Take 1 tablet (112 mcg total) by mouth daily. 90 tablet 3  . losartan (COZAAR) 50 MG tablet TAKE 1 TABLET BY MOUTH EVERY DAY 30 tablet 0  . magnesium oxide (MAG-OX) 400 MG tablet Take 400 mg by mouth daily.    . meloxicam (MOBIC) 15 MG tablet Take 1 tablet (15 mg total) by mouth daily. 90 tablet 2  . Multiple Vitamin (MULTIVITAMIN) capsule Take 1 capsule by mouth daily.      . potassium chloride (KLOR-CON) 8 MEQ tablet Take 8 mEq daily by mouth.    . predniSONE (DELTASONE) 10 MG tablet 3 tabs by mouth per day for 3 days,2tabs per day for 3 days,1tab per day for 3 days 18 tablet 0  . rosuvastatin (CRESTOR) 10 MG tablet TAKE 1 TABLET BY MOUTH EVERY DAY 90 tablet 3  . SYMBICORT 160-4.5 MCG/ACT inhaler INHALE 2 PUFFS INTO THE LUNGS 2 (TWO) TIMES DAILY. 10.2 Inhaler 2  . valACYclovir (VALTREX) 1000 MG tablet Take 500 mg by  mouth daily.  0  . Vitamin D, Ergocalciferol, (DRISDOL) 50000 units CAPS capsule Take 1 capsule (50,000 Units total) every 7 (seven) days by mouth. 12 capsule 0   No current facility-administered medications on file prior to visit.    Review of Systems Constitutional: Negative for other unusual diaphoresis, sweats, appetite or weight changes HENT: Negative for other worsening hearing loss, ear pain, facial swelling, mouth sores or neck stiffness.   Eyes: Negative for other worsening pain, redness or other visual disturbance.  Respiratory: Negative for other stridor or swelling Cardiovascular: Negative for other palpitations or other chest pain  Gastrointestinal: Negative for worsening diarrhea or loose stools, blood in stool, distention or other pain Genitourinary: Negative for  hematuria, flank pain or other change in urine volume.  Musculoskeletal: Negative for myalgias or other joint swelling.  Skin: Negative for other color change, or other wound or worsening drainage.  Neurological: Negative for other syncope or numbness. Hematological: Negative for other adenopathy or swelling Psychiatric/Behavioral: Negative for hallucinations, other worsening agitation, SI, self-injury, or new decreased concentration All other system neg per pt    Objective:   Physical Exam BP 126/82   Pulse 98   Temp 97.7 F (36.5 C) (Oral)   Ht 5\' 4"  (1.626 m)   Wt 242 lb (109.8 kg)   BMI 41.54 kg/m  VS noted,  Constitutional: Pt is oriented to person, place, and time. Appears well-developed and well-nourished, in no significant distress and comfortable Head: Normocephalic and atraumatic  Eyes: Conjunctivae and EOM are normal. Pupils are equal, round, and reactive to light Right Ear: External ear normal without discharge Left Ear: External ear normal without discharge Nose: Nose without discharge or deformity Mouth/Throat: Oropharynx is without other ulcerations and moist  Neck: Normal range of motion.  Neck supple. No JVD present. No tracheal deviation present or significant neck LA or mass Cardiovascular: Normal rate, regular rhythm, normal heart sounds and intact distal pulses.   Pulmonary/Chest: WOB normal and breath sounds without rales or wheezing  Abdominal: Soft. Bowel sounds are normal. NT. No HSM  Musculoskeletal: Normal range of motion. Exhibits no edema Lymphadenopathy: Has no other cervical adenopathy.  Neurological: Pt is alert and oriented to person, place, and time. Pt has normal reflexes. No cranial nerve deficit. Motor grossly intact, Gait intact Skin: Skin is warm and dry. No rash noted or new ulcerations Psychiatric:  Has normal mood and affect. Behavior is normal without agitation No other exam findings Lab Results  Component Value Date   WBC 8.3 09/27/2017   HGB 13.4 09/27/2017   HCT 40.3 09/27/2017   PLT 265.0 09/27/2017   GLUCOSE 105 (H) 09/27/2017   CHOL 119 09/27/2017   TRIG 61.0 09/27/2017   HDL 77.50 09/27/2017   LDLCALC 29 09/27/2017   ALT 27 09/27/2017   AST 21 09/27/2017   NA 139 09/27/2017   K 3.9 09/27/2017   CL 101 09/27/2017   CREATININE 1.24 (H) 09/27/2017   BUN 22 09/27/2017   CO2 29 09/27/2017   TSH 2.49 09/27/2017   INR 1.0 03/21/2017   HGBA1C 6.0 09/27/2017   MICROALBUR 2.2 (H) 03/21/2017       Assessment & Plan:

## 2017-09-27 NOTE — Patient Instructions (Addendum)
You had the Pneumovax pneumonia shot today  Please take all new medication as prescribed - the prilosec   Please continue all other medications as before, and refills have been done if requested.  Please have the pharmacy call with any other refills you may need.  Please continue your efforts at being more active, low cholesterol diet, and weight control.  You are otherwise up to date with prevention measures today.  You will be contacted regarding the referral for: Medical weight management, and Bariatric surgury consultation  Please keep your appointments with your specialists as you may have planned  Please go to the LAB in the Basement (turn left off the elevator) for the tests to be done today  /You will be contacted by phone if any changes need to be made immediately.  Otherwise, you will receive a letter about your results with an explanation, but please check with MyChart first.  Please remember to sign up for MyChart if you have not done so, as this will be important to you in the future with finding out test results, communicating by private email, and scheduling acute appointments online when needed.  Please return in 6 months, or sooner if needed

## 2017-09-28 ENCOUNTER — Ambulatory Visit: Payer: BLUE CROSS/BLUE SHIELD | Admitting: Physical Therapy

## 2017-09-28 DIAGNOSIS — M5442 Lumbago with sciatica, left side: Secondary | ICD-10-CM

## 2017-09-28 DIAGNOSIS — M62830 Muscle spasm of back: Secondary | ICD-10-CM | POA: Diagnosis not present

## 2017-09-28 DIAGNOSIS — R262 Difficulty in walking, not elsewhere classified: Secondary | ICD-10-CM

## 2017-09-28 NOTE — Therapy (Signed)
[facility] [facility] [address] [place], [place], [address] Phone: [phone]   Fax:  [phone]  Physical Therapy Treatment  Patient Details  Name: Suzanne Hansen MRN: [id number] Date of Birth: 21-Aug-1956 Referring Provider: Saintclair Halsted   Encounter Date: 09/28/2017  PT End of Session - 09/28/17 1733    Visit Number  15    Date for PT Re-Evaluation  09/30/17    PT Start Time  1700    PT Stop Time  1755    PT Time Calculation (min)  55 min       Past Medical History:  Diagnosis Date  . ALLERGIC RHINITIS 05/09/2008  . Allergy   . ANXIETY 05/23/2007  . ARM PAIN, LEFT 10/23/2009  . Asthma   . ASTHMA 05/23/2007  . Cardiomyopathy ([place]) 09/08/2015  . CHF (congestive heart failure) ([place])   . CONTACT DERMATITIS 06/09/2009  . Diabetes mellitus   . ENDOMETRIOSIS NOS 05/23/2007  . FACIAL PAIN 06/02/2010  . FREQUENCY, URINARY 05/17/2010  . GLUCOSE INTOLERANCE 08/28/2009  . HERPES SIMPLEX, UNCOMPLICATED 05/04/3663  . Hyperlipidemia 08/28/2015  . HYPERTHYROIDISM 05/23/2007  . Hypoactive thyroid   . HYPOTHYROIDISM 05/09/2008  . Impaired glucose tolerance 01/28/2011  . INSOMNIA, HX OF 05/23/2007  . LATERAL EPICONDYLITIS, RIGHT 03/10/2009  . LIPOMA 10/22/2010  . NECK PAIN 11/10/2010  . OBESITY 05/23/2007  . Plantar fasciitis    Both feet  . SHOULDER PAIN, LEFT 05/17/2010  . SINUSITIS- ACUTE-NOS 11/03/2008  . SKIN LESION 11/10/2010  . Sleep apnea   . Unspecified Chest Pain 07/25/2008  . UNSPECIFIED URTICARIA 06/04/2009  . URI 08/28/2009  . UTI 04/29/2008  . VITAMIN D DEFICIENCY 08/28/2009  . Wheezing 07/12/2010    Past Surgical History:  Procedure Laterality Date  . COLONOSCOPY    . ENDOMETRIAL ABLATION    . LAMINECTOMY  1996  . LAPAROTOMY     exploratory  . POLYPECTOMY    . s/p endometrial ablation  12/06  . TUBAL LIGATION    . UTERINE FIBROID SURGERY    . WISDOM TOOTH EXTRACTION      There were no vitals filed for this  visit.  Subjective Assessment - 09/28/17 1703    Subjective  doing good, waiting to hear back from [facility] re: weight loss program    Currently in Pain?  Yes    Pain Score  3     Pain Location  Back                      [facility] Adult PT Treatment/Exercise - 09/28/17 0001      Lumbar Exercises: Aerobic   Elliptical  Nustep 8 min L 5     Tread Mill  34mh 10 min .35      Lumbar Exercises: Machines for Strengthening   Cybex Lumbar Extension  black tband 2 sets 15 trunk flex and ext    Other Lumbar Machine Exercise  seated row 15# 2 sets 15      Knee/Hip Exercises: Machines for Strengthening   Cybex Knee Extension  10# 2 sets 10    Cybex Knee Flexion  25# 2 sets 10      Moist Heat Therapy   Number Minutes Moist Heat  15 Minutes    Moist Heat Location  Lumbar Spine      Electrical Stimulation   Electrical Stimulation Location  LB    Electrical Stimulation Action  IFC    Electrical Stimulation Parameters  sitting    Electrical Stimulation Goals  Pain               PT Short Term Goals - 08/08/17 1427      PT SHORT TERM GOAL #1   Title  independent with initial HEP    Status  Achieved        PT Long Term Goals - 09/28/17 1734      PT LONG TERM GOAL #1   Title  decrease back pain 50%    Status  Partially Met      PT LONG TERM GOAL #2   Title  decrease left shoulder pain 50%    Status  Partially Met      PT LONG TERM GOAL #3   Title  walk .5 miles without difficulty    Baseline  .35    Status  Partially Met      PT LONG TERM GOAL #4   Title  increase AROM of the lumbar spine 25%    Status  Achieved            Plan - 09/28/17 1734    Clinical Impression Statement  goals partial met, pt to D/C and conrinue with indepdant ex at out gym for continued strength and endurance    PT Treatment/Interventions  ADLs/Self Care Home Management;Cryotherapy;Electrical Stimulation;Iontophoresis '4mg'$ /ml Dexamethasone;Moist  Heat;Traction;Ultrasound;Therapeutic activities;Gait training;Stair training;Functional mobility training;Patient/family education;Neuromuscular re-education;Balance training;Therapeutic exercise;Manual techniques;Vasopneumatic Device    PT Next Visit Plan  D/C       Patient will benefit from skilled therapeutic intervention in order to improve the following deficits and impairments:  Abnormal gait, Cardiopulmonary status limiting activity, Decreased activity tolerance, Decreased balance, Decreased mobility, Decreased strength, Increased edema, Improper body mechanics, Impaired flexibility, Pain, Increased muscle spasms, Difficulty walking, Decreased range of motion  Visit Diagnosis: Muscle spasm of back  Difficulty in walking, not elsewhere classified  Acute bilateral low back pain with left-sided sciatica     Problem List Patient Active Problem List   Diagnosis Date Noted  . Hyperglycemia 09/27/2017  . GERD (gastroesophageal reflux disease) 09/27/2017  . Acute shoulder bursitis, left 08/28/2017  . Lumbosacral radiculopathy at L4 06/14/2017  . Easy bruising 03/29/2017  . Left leg paresthesias 03/29/2017  . Muscle cramping 03/20/2017  . 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 Pain in lower jaw 11/10/2016  . Cough 11/06/2016  . Vaginitis 09/17/2016  . Left wrist pain 09/16/2016  . Hypersomnia with sleep apnea 02/15/2016  . 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 Extrinsic asthma 02/15/2016  . Ankle edema 02/15/2016  . Degenerative arthritis of knee, bilateral 01/21/2016  . Asthma with exacerbation 10/20/2015  . Acute sinus infection 09/29/2015  . Cardiomyopathy ([place]) 09/08/2015  . Bilateral calf pain 08/31/2015  . Hyperlipidemia 08/28/2015  . Left knee pain 08/28/2015  . Varicose veins with pain 08/28/2015  . Peripheral edema 08/13/2015  . Injection site reaction 06/02/2015  . PHN (postherpetic neuralgia) 05/29/2015  . Unspecified asthma, with exacerbation 07/01/2014   . Shingles 06/10/2014  . Skin nodule 11/27/2012  . Diabetes ([place]) 01/28/2011  . Encounter for preventative adult health care exam with abnormal findings 01/28/2011  . LIPOMA 10/22/2010  . VITAMIN D DEFICIENCY 08/28/2009  . Hypothyroidism 05/09/2008  . Allergic rhinitis 05/09/2008  . HERPES ZOSTER W/NERVOUS COMPLICATION NEC 17/40/8144  . HYPERTHYROIDISM 05/23/2007  . OBESITY 05/23/2007  . Anxiety state 05/23/2007  . Asthma 05/23/2007  . ENDOMETRIOSIS NOS 05/23/2007  . INSOMNIA, HX OF 05/23/2007  PHYSICAL THERAPY DISCHARGE SUMMARY   Plan:  Patient agrees to discharge.  Patient goals were partially met. Patient is being discharged due to meeting the stated rehab goals.  ?????       Fardowsa Authier,ANGIE PTA 09/28/2017, 5:36 PM  [address] [place], [place], [address] Phone: [phone]   Fax:  [phone]  Name: Jacaria Colburn MRN: [id number] Date of Birth: Dec 11, 1955

## 2017-09-30 ENCOUNTER — Encounter: Payer: Self-pay | Admitting: Internal Medicine

## 2017-09-30 NOTE — Assessment & Plan Note (Signed)
stable overall by history and exam, recent data reviewed with pt, and pt to continue medical treatment as before,  to f/u any worsening symptoms or concerns Lab Results  Component Value Date   HGBA1C 6.0 09/27/2017

## 2017-09-30 NOTE — Assessment & Plan Note (Signed)
stable overall by history and exam, recent data reviewed with pt, and pt to continue medical treatment as before,  to f/u any worsening symptoms or concerns Lab Results  Component Value Date   TSH 2.49 09/27/2017

## 2017-09-30 NOTE — Assessment & Plan Note (Signed)
[facility] for referrals - medical nutrition, and bariatric surgury consult as well

## 2017-09-30 NOTE — Assessment & Plan Note (Signed)

## 2017-09-30 NOTE — Assessment & Plan Note (Signed)
stable overall by history and exam, recent data reviewed with pt, and pt to continue medical treatment as before,  to f/u any worsening symptoms or concerns Lab Results  Component Value Date   LDLCALC 29 09/27/2017

## 2017-09-30 NOTE — Assessment & Plan Note (Signed)
Mild without red flags, for PPI tx,  to f/u any worsening symptoms or concerns

## 2017-10-09 ENCOUNTER — Telehealth: Payer: Self-pay | Admitting: Internal Medicine

## 2017-10-09 NOTE — Telephone Encounter (Signed)
Copied from [place] [id number]. Topic: Quick Communication - See Telephone Encounter >> Oct 09, 2017  8:36 AM Bea Graff, NT wrote: CRM for notification. See Telephone encounter for: Pt just finished a round of antibiotics but now has symptoms of a yeast infection and would like to see if something can be called in to [facility] pharmacy. [facility] [address]  10/09/17.

## 2017-10-11 MED ORDER — FLUCONAZOLE 150 MG PO TABS: ORAL_TABLET | ORAL | 1 refills | Status: DC

## 2017-10-11 NOTE — Telephone Encounter (Signed)
Ok for diflucan  - done erx 

## 2017-10-26 ENCOUNTER — Other Ambulatory Visit: Payer: Self-pay | Admitting: Internal Medicine

## 2017-11-22 ENCOUNTER — Other Ambulatory Visit: Payer: Self-pay | Admitting: Family Medicine

## 2017-11-22 DIAGNOSIS — M25562 Pain in left knee: Principal | ICD-10-CM

## 2017-11-22 DIAGNOSIS — M25561 Pain in right knee: Secondary | ICD-10-CM

## 2017-11-23 ENCOUNTER — Other Ambulatory Visit: Payer: Self-pay | Admitting: Family Medicine

## 2017-12-06 ENCOUNTER — Other Ambulatory Visit: Payer: Self-pay | Admitting: Internal Medicine

## 2017-12-06 ENCOUNTER — Encounter (INDEPENDENT_AMBULATORY_CARE_PROVIDER_SITE_OTHER): Payer: BLUE CROSS/BLUE SHIELD

## 2017-12-11 ENCOUNTER — Telehealth: Payer: Self-pay | Admitting: Internal Medicine

## 2017-12-11 MED ORDER — BUDESONIDE-FORMOTEROL FUMARATE 160-4.5 MCG/ACT IN AERO: INHALATION_SPRAY | RESPIRATORY_TRACT | 1 refills | Status: DC

## 2017-12-11 MED ORDER — ALBUTEROL SULFATE HFA 108 (90 BASE) MCG/ACT IN AERS: INHALATION_SPRAY | RESPIRATORY_TRACT | 1 refills | Status: DC

## 2017-12-11 NOTE — Telephone Encounter (Signed)
Copied from [place]. Topic: Quick Communication - Rx Refill/Question >> Dec 11, 2017  9:54 AM Boyd Kerbs wrote: Medication:  She is asking for the Proair to be called in now with refills and the symbicort she just got but no refills on it and asking for another prescription with refills be called into different pharmacy.  albuterol (PROAIR HFA) 108 (90 Base) MCG/ACT inhaler  SYMBICORT 160-4.5 MCG/ACT inhaler   Has the patient contacted their pharmacy? Yes.   [facility] She switching pharmacies  (Agent: If no, request that the patient contact the pharmacy for the refill.)   Preferred Pharmacy (with phone number or street name):  [facility] [place], [place] [address] [phone]    Agent: Please be advised that RX refills may take up to 3 business days. We ask that you follow-up with your pharmacy.

## 2017-12-18 ENCOUNTER — Ambulatory Visit (INDEPENDENT_AMBULATORY_CARE_PROVIDER_SITE_OTHER): Payer: BLUE CROSS/BLUE SHIELD | Admitting: Family Medicine

## 2017-12-20 ENCOUNTER — Ambulatory Visit: Payer: Self-pay

## 2017-12-20 NOTE — Telephone Encounter (Signed)
Pt. C/o symptoms started Monday. Dry cough,sore throat,ear pain,headache. Denies fever. Pt. Refuses to see anyone but Dr. Jenny Reichmann. Appointment made. Instructed if she changed her mind about seeing another provider to call back. Verbalizes understanding. Reason for Disposition . [1] Continuous (nonstop) coughing interferes with work or school AND [2] no improvement using cough treatment per Care Advice  Answer Assessment - Initial Assessment Questions 1. ONSET: "When did the cough begin?"      Started Monday 2. SEVERITY: "How bad is the cough today?"      Moderate 3. RESPIRATORY DISTRESS: "Describe your breathing."      No distress 4. FEVER: "Do you have a fever?" If so, ask: "What is your temperature, how was it measured, and when did it start?"     No 5. SPUTUM: "Describe the color of your sputum" (clear, white, yellow, green)     Clear 6. HEMOPTYSIS: "Are you coughing up any blood?" If so ask: "How much?" (flecks, streaks, tablespoons, etc.)     No 7. CARDIAC HISTORY: "Do you have any history of heart disease?" (e.g., heart attack, congestive heart failure)      Cardiomyopathy 8. LUNG HISTORY: "Do you have any history of lung disease?"  (e.g., pulmonary embolus, asthma, emphysema)     Asthma 9. PE RISK FACTORS: "Do you have a history of blood clots?" (or: recent major surgery, recent prolonged travel, bedridden )     No 10. OTHER SYMPTOMS: "Do you have any other symptoms?" (e.g., runny nose, wheezing, chest pain)       Sore throat,ear pain,headache 11. PREGNANCY: "Is there any chance you are pregnant?" "When was your last menstrual period?"       No 12. TRAVEL: "Have you traveled out of the country in the last month?" (e.g., travel history, exposures)       No  Protocols used: [place]

## 2017-12-22 ENCOUNTER — Other Ambulatory Visit: Payer: Self-pay | Admitting: Internal Medicine

## 2017-12-25 ENCOUNTER — Encounter: Payer: Self-pay | Admitting: Family

## 2017-12-25 ENCOUNTER — Ambulatory Visit: Payer: Managed Care, Other (non HMO) | Admitting: Family

## 2017-12-25 ENCOUNTER — Telehealth: Payer: Self-pay | Admitting: Internal Medicine

## 2017-12-25 ENCOUNTER — Ambulatory Visit: Payer: 59 | Admitting: Internal Medicine

## 2017-12-25 ENCOUNTER — Other Ambulatory Visit (INDEPENDENT_AMBULATORY_CARE_PROVIDER_SITE_OTHER): Payer: Managed Care, Other (non HMO)

## 2017-12-25 VITALS — BP 130/84 | HR 78 | Temp 98.5°F | Ht 64.0 in | Wt 239.0 lb

## 2017-12-25 DIAGNOSIS — E039 Hypothyroidism, unspecified: Secondary | ICD-10-CM | POA: Diagnosis not present

## 2017-12-25 DIAGNOSIS — J309 Allergic rhinitis, unspecified: Secondary | ICD-10-CM

## 2017-12-25 LAB — TSH: TSH: 0.75 u[IU]/mL (ref 0.35–4.50)

## 2017-12-25 MED ORDER — FLUTICASONE PROPIONATE 50 MCG/ACT NA SUSP: 2.0000 | Freq: Every day | NASAL | 6 refills | Status: DC

## 2017-12-25 MED ORDER — HYDROCOD POLST-CPM POLST ER 10-8 MG/5ML PO SUER: 5.0000 mL | Freq: Every evening | ORAL | 0 refills | Status: DC | PRN

## 2017-12-25 MED ORDER — LEVOFLOXACIN 500 MG PO TABS: 500.0000 mg | ORAL_TABLET | Freq: Every day | ORAL | 0 refills | Status: DC

## 2017-12-25 NOTE — Progress Notes (Signed)
Suzanne Hansen is a 62 y.o. female with the following history as recorded in EpicCare:  Patient Active Problem List   Diagnosis Date Noted  . Hyperglycemia 09/27/2017  . GERD (gastroesophageal reflux disease) 09/27/2017  . Acute shoulder bursitis, left 08/28/2017  . Lumbosacral radiculopathy at L4 06/14/2017  . Easy bruising 03/29/2017  . Left leg paresthesias 03/29/2017  . Muscle cramping 03/20/2017  . 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 Pain in lower jaw 11/10/2016  . Cough 11/06/2016  . Vaginitis 09/17/2016  . Left wrist pain 09/16/2016  . Hypersomnia with sleep apnea 02/15/2016  . 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 Extrinsic asthma 02/15/2016  . Ankle edema 02/15/2016  . Degenerative arthritis of knee, bilateral 01/21/2016  . Asthma with exacerbation 10/20/2015  . Acute sinus infection 09/29/2015  . Cardiomyopathy ([place]) 09/08/2015  . Bilateral calf pain 08/31/2015  . Hyperlipidemia 08/28/2015  . Left knee pain 08/28/2015  . Varicose veins with pain 08/28/2015  . Peripheral edema 08/13/2015  . Injection site reaction 06/02/2015  . PHN (postherpetic neuralgia) 05/29/2015  . Unspecified asthma, with exacerbation 07/01/2014  . Shingles 06/10/2014  . Skin nodule 11/27/2012  . Diabetes ([place]) 01/28/2011  . Encounter for preventative adult health care exam with abnormal findings 01/28/2011  . LIPOMA 10/22/2010  . VITAMIN D DEFICIENCY 08/28/2009  . Hypothyroidism 05/09/2008  . Allergic rhinitis 05/09/2008  . HERPES ZOSTER W/NERVOUS COMPLICATION NEC 35/36/1443  . HYPERTHYROIDISM 05/23/2007  . OBESITY 05/23/2007  . Anxiety state 05/23/2007  . Asthma 05/23/2007  . ENDOMETRIOSIS NOS 05/23/2007  . INSOMNIA, HX OF 05/23/2007    Current Outpatient Medications  Medication Sig Dispense Refill  . albuterol (PROAIR HFA) 108 (90 Base) MCG/ACT inhaler INHALE 2 PUFFS INTO THE LUNGS EVERY 4 (FOUR) HOURS AS NEEDED FOR WHEEZING OR SHORTNESS OF BREATH. 8.5 Inhaler 1  .  albuterol (PROVENTIL HFA;VENTOLIN HFA) 108 (90 Base) MCG/ACT inhaler INHALE 2 PUFFS INTO THE LUNGS EVERY 4 (FOUR) HOURS AS NEEDED FOR WHEEZING OR SHORTNESS OF BREATH.  1  . aspirin 81 MG EC tablet Take 81 mg by mouth daily.      . budesonide-formoterol (SYMBICORT) 160-4.5 MCG/ACT inhaler INHALE 2 PUFFS INTO THE LUNGS 2 (TWO) TIMES DAILY. 10.2 Inhaler 1  . carvedilol (COREG) 3.125 MG tablet Take 1 tablet (3.125 mg total) by mouth 2 (two) times daily. 60 tablet 0  . Cholecalciferol (VITAMIN D) 2000 UNITS CAPS Take 2 capsules by mouth daily.    . cyclobenzaprine (FLEXERIL) 10 MG tablet Take 10 mg by mouth every 8 (eight) hours as needed.  0  . cyclobenzaprine (FLEXERIL) 5 MG tablet Take 5 mg by mouth 3 (three) times daily.  0  . Diclofenac Sodium (PENNSAID) 2 % SOLN Place 2 application 2 (two) times daily onto the skin. 112 g 3  . Diclofenac Sodium 2 % SOLN Place 1 application onto the skin 2 (two) times daily. 112 g 3  . fexofenadine (ALLEGRA) 180 MG tablet Take 180 mg by mouth daily.      . fluconazole (DIFLUCAN) 150 MG tablet 1 tab by mouth every 3 days as needed 2 tablet 1  . gabapentin (NEURONTIN) 300 MG capsule TAKE 1 CAPSULE BY MOUTH THREE TIMES A DAY 90 capsule 1  . KLOR-CON M20 20 MEQ tablet TAKE 2 TABLETS (40 MEQ TOTAL) BY MOUTH DAILY  3  . levothyroxine (SYNTHROID, LEVOTHROID) 112 MCG tablet Take 1 tablet (112 mcg total) by mouth daily. 90 tablet 3  . losartan (COZAAR) 50 MG tablet TAKE 1 TABLET  BY MOUTH EVERY DAY 30 tablet 0  . magnesium oxide (MAG-OX) 400 MG tablet Take 400 mg by mouth daily.    . Magnesium Oxide 500 MG TABS Take by mouth.    . meloxicam (MOBIC) 15 MG tablet TAKE 1 TABLET BY MOUTH EVERY DAY 90 tablet 2  . Multiple Vitamin (MULTIVITAMIN) capsule Take 1 capsule by mouth daily.      [name] omeprazole (PRILOSEC) 20 MG capsule Take 1 capsule (20 mg total) by mouth daily. 90 capsule 3  . potassium chloride (KLOR-CON) 8 MEQ tablet Take 8 mEq daily by mouth.    . predniSONE  (DELTASONE) 10 MG tablet 3 tabs by mouth per day for 3 days,2tabs per day for 3 days,1tab per day for 3 days 18 tablet 0  . rosuvastatin (CRESTOR) 10 MG tablet TAKE 1 TABLET BY MOUTH EVERY DAY 90 tablet 3  . torsemide (DEMADEX) 10 MG tablet Take by mouth.    . torsemide (DEMADEX) 20 MG tablet Take 60mg  in the am and 40mg  in the pm.    . valACYclovir (VALTREX) 1000 MG tablet Take 500 mg by mouth daily.  0  . Vitamin D, Ergocalciferol, (DRISDOL) 50000 units CAPS capsule TAKE 1 CAPSULE (50,000 UNITS TOTAL) EVERY 7 (SEVEN) DAYS BY MOUTH. 12 capsule 0  . chlorpheniramine-HYDROcodone (TUSSIONEX PENNKINETIC ER) 10-8 MG/5ML SUER Take 5 mLs by mouth at bedtime as needed for cough. 75 mL 0  . fluticasone (FLONASE) 50 MCG/ACT nasal spray Place 2 sprays into both nostrils daily. 16 g 6  . levofloxacin (LEVAQUIN) 500 MG tablet Take 1 tablet (500 mg total) by mouth daily. 10 tablet 0   No current facility-administered medications for this visit.     Allergies: Ivp dye [iodinated diagnostic agents]; Latex; Clarithromycin; Metronidazole; and Zostavax [zoster vaccine live]  Past Medical History:  Diagnosis Date  . ALLERGIC RHINITIS 05/09/2008  . Allergy   . ANXIETY 05/23/2007  . ARM PAIN, LEFT 10/23/2009  . Asthma   . ASTHMA 05/23/2007  . Cardiomyopathy ([place]) 09/08/2015  . CHF (congestive heart failure) ([place])   . CONTACT DERMATITIS 06/09/2009  . Diabetes mellitus   . ENDOMETRIOSIS NOS 05/23/2007  . FACIAL PAIN 06/02/2010  . FREQUENCY, URINARY 05/17/2010  . GLUCOSE INTOLERANCE 08/28/2009  . HERPES SIMPLEX, UNCOMPLICATED 1/96/2229  . Hyperlipidemia 08/28/2015  . HYPERTHYROIDISM 05/23/2007  . Hypoactive thyroid   . HYPOTHYROIDISM 05/09/2008  . Impaired glucose tolerance 01/28/2011  . INSOMNIA, HX OF 05/23/2007  . LATERAL EPICONDYLITIS, RIGHT 03/10/2009  . LIPOMA 10/22/2010  . NECK PAIN 11/10/2010  . OBESITY 05/23/2007  . Plantar fasciitis    Both feet  . SHOULDER PAIN, LEFT 05/17/2010  . SINUSITIS- ACUTE-NOS  11/03/2008  . SKIN LESION 11/10/2010  . Sleep apnea   . Unspecified Chest Pain 07/25/2008  . UNSPECIFIED URTICARIA 06/04/2009  . URI 08/28/2009  . UTI 04/29/2008  . VITAMIN D DEFICIENCY 08/28/2009  . Wheezing 07/12/2010    Past Surgical History:  Procedure Laterality Date  . COLONOSCOPY    . ENDOMETRIAL ABLATION    . LAMINECTOMY  1996  . LAPAROTOMY     exploratory  . POLYPECTOMY    . s/p endometrial ablation  12/06  . TUBAL LIGATION    . UTERINE FIBROID SURGERY    . WISDOM TOOTH EXTRACTION      Family History  Problem Relation Age of Onset  . Diabetes Cousin   . Hypertension Cousin   . Heart disease Cousin   . Heart disease Mother  Rheumatic heart disease  . COPD Father   . Hypertension Father   . Cancer Maternal Grandmother        Breast  . Diabetes Sister   . Kidney disease Sister     Social History   Tobacco Use  . Smoking status: Former Smoker    Packs/day: 0.50    Types: Cigarettes    Last attempt to quit: 11/05/1987    Years since quitting: 30.1  . Smokeless tobacco: Never Used  Substance Use Topics  . Alcohol use: No    Alcohol/week: 0.6 oz    Types: 1 Glasses of wine per week    Comment: WINE    Subjective:  Patient presents with increased concern for fatigue at the end of last week; would like to get her thyroid level checked today to make sure dosage does not need to be adjusted since she is traveling next week; did see cardiologist on Thursday and was reassured that fatigue is not related to heart failure; cardiologist recommended that she come here to get her thyroid level checked; notes that she has been sleeping well; does feel that she may have sinus infection- has been feeling some pressure in her face; does have underlying allergies; is specifically requesting Rx for Levaquin and Tussionex which her PCP "always gives me."  Objective:  Vitals:   12/25/17 0947  BP: 130/84  Pulse: 78  Temp: 98.5 F (36.9 C)  TempSrc: Oral  SpO2: 98%   Weight: 239 lb (108.4 kg)  Height: 5\' 4"  (1.626 m)    General: Well developed, well nourished, in no acute distress  Skin : Warm and dry.  Head: Normocephalic and atraumatic  Eyes: Sclera and conjunctiva clear; pupils round and reactive to light; extraocular movements intact  Ears: External normal; canals clear; tympanic membranes congested bilaterally Oropharynx: Pink, supple. No suspicious lesions  Neck: Supple without thyromegaly, adenopathy  Lungs: Respirations unlabored; clear to auscultation bilaterally without wheeze, rales, rhonchi  [facility] exam: normal rate and regular rhythm.  Neurologic: Alert and oriented; speech intact; face symmetrical; moves all extremities well; CNII-XII intact without focal deficit   Assessment:  1. Hypothyroidism, unspecified type   2. Allergic rhinitis, unspecified seasonality, unspecified trigger     Plan:  1. Check TSH per patient request today; follow-up to be determined; 2. Recommend that she try adding Flonase to her Allegra; she is given Rx for Levaquin as requested to hold and fill if no improvement in her symptoms within 48 hours; Rx also given for Tussionex to use at night; increase fluids, rest and follow-up worse, no better.   No Follow-up on file.  Orders Placed This Encounter  Procedures  . TSH    Standing Status:   Future    Number of Occurrences:   1    Standing Expiration Date:   12/25/2018    Requested Prescriptions   Signed Prescriptions Disp Refills  . levofloxacin (LEVAQUIN) 500 MG tablet 10 tablet 0    Sig: Take 1 tablet (500 mg total) by mouth daily.  . fluticasone (FLONASE) 50 MCG/ACT nasal spray 16 g 6    Sig: Place 2 sprays into both nostrils daily.  . chlorpheniramine-HYDROcodone (TUSSIONEX PENNKINETIC ER) 10-8 MG/5ML SUER 75 mL 0    Sig: Take 5 mLs by mouth at bedtime as needed for cough.

## 2017-12-25 NOTE — Telephone Encounter (Signed)
Resent Levofloxacin to [facility], other two was already sent.Suzanne KitchenJohny Chess

## 2017-12-25 NOTE — Telephone Encounter (Signed)
Patient calling and stating meds were to be [facility]. Please advise

## 2017-12-25 NOTE — Telephone Encounter (Signed)
Copied from [place]. Topic: General - Other >> Dec 25, 2017 10:33 AM Yvette Rack wrote: Reason for CRM: [id number] [place] [address], [place] stating that Jodi Mourning cant prescribe    chlorpheniramine-HYDROcodone (TUSSIONEX PENNKINETIC ER) 10-8 MG/5ML SUER  and that she not registered in [place] for this medicine that her supervisor can send a RX for this medicine

## 2017-12-29 ENCOUNTER — Ambulatory Visit: Payer: 59 | Admitting: Internal Medicine

## 2018-01-07 NOTE — Progress Notes (Signed)
[name] [place], [place] [address] Phone: [phone] Subjective:      CC: Shoulder pain follow-up  [id number]  Suzanne Hansen is a 62 y.o. female coming in with complaint of shoulder pain.  Patient has had left shoulder pain for quite some time.  Last injection was August 28, 2017.  Seem to be more secondary to bursitis.  Was 99% better after last injection.  Patient states that her right shoulder is bothering her for one week. She lifted her granddaughter and heard some popping. She had pain prior to this.  Patient states that since then has not decreasing range of motion.  Waking up at night.  Rates the severity of pain is 9 out of 10.  Patient's right foot is also bothering her. Patient was told that she has a bone spur in her heel. She was given 2 shots and placed in a night splint. Neither are helping her. Patient has pain in the mornings. Patient did get new shoes which did not help.     Past Medical History:  Diagnosis Date  . ALLERGIC RHINITIS 05/09/2008  . Allergy   . ANXIETY 05/23/2007  . ARM PAIN, LEFT 10/23/2009  . Asthma   . ASTHMA 05/23/2007  . Cardiomyopathy ([place]) 09/08/2015  . CHF (congestive heart failure) ([place])   . CONTACT DERMATITIS 06/09/2009  . Diabetes mellitus   . ENDOMETRIOSIS NOS 05/23/2007  . FACIAL PAIN 06/02/2010  . FREQUENCY, URINARY 05/17/2010  . GLUCOSE INTOLERANCE 08/28/2009  . HERPES SIMPLEX, UNCOMPLICATED 8/78/6767  . Hyperlipidemia 08/28/2015  . HYPERTHYROIDISM 05/23/2007  . Hypoactive thyroid   . HYPOTHYROIDISM 05/09/2008  . Impaired glucose tolerance 01/28/2011  . INSOMNIA, HX OF 05/23/2007  . LATERAL EPICONDYLITIS, RIGHT 03/10/2009  . LIPOMA 10/22/2010  . NECK PAIN 11/10/2010  . OBESITY 05/23/2007  . Plantar fasciitis    Both feet  . SHOULDER PAIN, LEFT 05/17/2010  . SINUSITIS- ACUTE-NOS 11/03/2008  . SKIN LESION 11/10/2010  . Sleep apnea   . Unspecified Chest Pain 07/25/2008  . UNSPECIFIED URTICARIA  06/04/2009  . URI 08/28/2009  . UTI 04/29/2008  . VITAMIN D DEFICIENCY 08/28/2009  . Wheezing 07/12/2010   Past Surgical History:  Procedure Laterality Date  . COLONOSCOPY    . ENDOMETRIAL ABLATION    . LAMINECTOMY  1996  . LAPAROTOMY     exploratory  . POLYPECTOMY    . s/p endometrial ablation  12/06  . TUBAL LIGATION    . UTERINE FIBROID SURGERY    . WISDOM TOOTH EXTRACTION     Social History   Socioeconomic History  . Marital status: Divorced    Spouse name: Not on file  . Number of children: 3  . Years of education: Not on file  . Highest education level: Not on file  Occupational History  . Occupation: SOCIAL WORKER    Employer: [employer]    Comment: [employer]  . Financial resource strain: Not on file  . Food insecurity:    Worry: Not on file    Inability: Not on file  . Transportation needs:    Medical: Not on file    Non-medical: Not on file  Tobacco Use  . Smoking status: Former Smoker    Packs/day: 0.50    Types: Cigarettes    Last attempt to quit: 11/05/1987    Years since quitting: 30.1  . Smokeless tobacco: Never Used  Substance and Sexual Activity  . Alcohol use:  No    Alcohol/week: 0.6 oz    Types: 1 Glasses of wine per week    Comment: WINE  . Drug use: No  . Sexual activity: Not on file  Lifestyle  . Physical activity:    Days per week: Not on file    Minutes per session: Not on file  . Stress: Not on file  Relationships  . Social connections:    Talks on phone: Not on file    Gets together: Not on file    Attends religious service: Not on file    Active member of club or organization: Not on file    Attends meetings of clubs or organizations: Not on file    Relationship status: Not on file  Other Topics Concern  . Not on file  Social History Narrative  . Not on file   Allergies  Allergen Reactions  . Ivp Dye [Iodinated Diagnostic Agents] Shortness Of Breath and Other (See Comments)    Swelling of  extremity of the injection site  . Latex Itching    ITCHING AND COLD SORES AROUND MOUTH WHEN LATEX GLOVES USED BY THE DENTIST/HYGENTIST  . Clarithromycin Nausea And Vomiting    REACTION: nausea  vomiting  . Metronidazole Nausea And Vomiting  . Zostavax [Zoster Vaccine Live]    Family History  Problem Relation Age of Onset  . Diabetes Cousin   . Hypertension Cousin   . Heart disease Cousin   . Heart disease Mother        Rheumatic heart disease  . COPD Father   . Hypertension Father   . Cancer Maternal Grandmother        Breast  . Diabetes Sister   . Kidney disease Sister      Past medical history, social, surgical and family history all reviewed in electronic medical record.  No pertanent information unless stated regarding to the chief complaint.   Review of Systems:Review of systems updated and as accurate as of 01/08/18  No headache, visual changes, nausea, vomiting, diarrhea, constipation, dizziness, abdominal pain, skin rash, fevers, chills, night sweats, weight loss, swollen lymph nodes, body aches, joint swelling,  chest pain, shortness of breath, mood changes.  Positive muscle aches  Objective  Blood pressure 118/78, height 5\' 4"  (1.626 m), weight 246 lb (111.6 kg). Systems examined below as of 01/08/18   General: No apparent distress alert and oriented x3 mood and affect normal, dressed appropriately.  HEENT: Pupils equal, extraocular movements intact  Respiratory: Patient's speak in full sentences and does not appear short of breath  Cardiovascular: No lower extremity edema, non tender, no erythema  Skin: Warm dry intact with no signs of infection or rash on extremities or on axial skeleton.  Abdomen: Soft nontender  Neuro: Cranial nerves II through XII are intact, neurovascularly intact in all extremities with 2+ DTRs and 2+ pulses.  Lymph: No lymphadenopathy of posterior or anterior cervical chain or axillae bilaterally.  Gait normal with good balance and  coordination.  MSK:  Non tender with full range of motion and good stability and symmetric strength and tone of elbows, wrist, hip, knee and ankles bilaterally.  Foot exam shows the patient does have overpronation of the hindfoot.  Severely tender on the medial calcaneal region on the right side.  Mild breakdown of the transverse arch bilaterally. Shoulder: Right Inspection reveals no abnormalities, atrophy or asymmetry. Palpation is normal with no tenderness over AC joint or bicipital groove. ROM is full in all planes passively. Rotator  cuff strength normal throughout. signs of impingement with positive Neer and Hawkin's tests, but negative empty can sign. Speeds and Yergason's tests normal. No labral pathology noted with negative Obrien's, negative clunk and good stability. Normal scapular function observed. No painful arc and no drop arm sign. No apprehension sign  MSK US performed of: Right This study was ordered, performed, and interpreted by Charlann Boxer D.O.  Shoulder:   Supraspinatus:  Appears normal on long and transverse views, Bursal bulge seen with shoulder abduction on impingement view. Infraspinatus:  Appears normal on long and transverse views. Significant increase in Doppler flow Subscapularis:  Appears normal on long and transverse views. Positive bursa Teres Minor:  Appears normal on long and transverse views. AC joint:  Capsule undistended, no geyser sign. Glenohumeral Joint:  Appears normal without effusion. Glenoid Labrum:  Intact without visualized tears. Biceps Tendon:  Appears normal on long and transverse views, no fraying of tendon, tendon located in intertubercular groove, no subluxation with shoulder internal or external rotation.  Impression: Subacromial bursitis  Procedure: Real-time Ultrasound Guided Injection of right glenohumeral joint Device: GE Logiq E  Ultrasound guided injection is preferred based studies that show increased duration, increased  effect, greater accuracy, decreased procedural pain, increased response rate with ultrasound guided versus blind injection.  Verbal informed consent obtained.  Time-out conducted.  Noted no overlying erythema, induration, or other signs of local infection.  Skin prepped in a sterile fashion.  Local anesthesia: Topical Ethyl chloride.  With sterile technique and under real time ultrasound guidance:  Joint visualized.  23g 1  inch needle inserted posterior approach. Pictures taken for needle placement. Patient did have injection of 2 cc of 1% lidocaine, 2 cc of 0.5% Marcaine, and 1.0 cc of Kenalog 40 mg/dL. Completed without difficulty  Pain immediately resolved suggesting accurate placement of the medication.  Advised to call if fevers/chills, erythema, induration, drainage, or persistent bleeding.  Images permanently stored and available for review in the ultrasound unit.  Impression: Technically successful ultrasound guided injection.    Impression and Recommendations:     This case required medical decision making of moderate complexity.      Note: This dictation was prepared with Dragon dictation along with smaller phrase technology. Any transcriptional errors that result from this process are unintentional.

## 2018-01-08 ENCOUNTER — Ambulatory Visit: Payer: Managed Care, Other (non HMO) | Admitting: Family Medicine

## 2018-01-08 ENCOUNTER — Ambulatory Visit: Payer: Self-pay

## 2018-01-08 ENCOUNTER — Encounter: Payer: Self-pay | Admitting: Family Medicine

## 2018-01-08 VITALS — BP 118/78 | Ht 64.0 in | Wt 246.0 lb

## 2018-01-08 DIAGNOSIS — M7551 Bursitis of right shoulder: Secondary | ICD-10-CM | POA: Diagnosis not present

## 2018-01-08 DIAGNOSIS — M25511 Pain in right shoulder: Secondary | ICD-10-CM

## 2018-01-08 DIAGNOSIS — S9030XA Contusion of unspecified foot, initial encounter: Secondary | ICD-10-CM | POA: Insufficient documentation

## 2018-01-08 DIAGNOSIS — S9031XA Contusion of right foot, initial encounter: Secondary | ICD-10-CM | POA: Diagnosis not present

## 2018-01-08 NOTE — Assessment & Plan Note (Signed)
Contralateral side.  No true tear appreciated.  Tendinitis.  Discussed icing regimen, topical anti-inflammatories, short course of oral anti-inflammatories with patient's heart failure.  We discussed avoiding certain activities.  Home exercises given.  Follow-up again in 4 weeks

## 2018-01-08 NOTE — Patient Instructions (Signed)
Good to see you  Suzanne Hansen is your friend.  pennsaid pinkie amount topically 2 times daily as needed.   Stop the meloxicam  Start duexis 3 times a day for 3 days.  Injected the shoulder  For the foot heel donut would be perfect  Do not walk barefoot.  Exercises 3 times a week.   See me again in 4 week s

## 2018-01-08 NOTE — Assessment & Plan Note (Signed)
Foot contusion noted.  Discussed icing regimen and home exercises.  Patient should do relatively well with a heel donut.  Follow-up again in 4 weeks

## 2018-02-02 ENCOUNTER — Ambulatory Visit: Payer: Managed Care, Other (non HMO) | Admitting: Family Medicine

## 2018-02-02 DIAGNOSIS — M5417 Radiculopathy, lumbosacral region: Secondary | ICD-10-CM

## 2018-02-02 DIAGNOSIS — M170 Bilateral primary osteoarthritis of knee: Secondary | ICD-10-CM

## 2018-02-02 DIAGNOSIS — M7551 Bursitis of right shoulder: Secondary | ICD-10-CM

## 2018-02-02 NOTE — Patient Instructions (Signed)
Good to see you  Suzanne Hansen is your friend.  Injected both knees  Have fun in [place] 3 times a day for 6 days See me again in 6 weeks

## 2018-02-02 NOTE — Progress Notes (Signed)
[facility] [place], [place] [address] Phone: [phone] Subjective:    I'm seeing this patient by the request  of:    CC: Right shoulder follow-up, lower leg follow-up, bilateral knee follow-up.  EHM:[id number]  Suzanne Hansen is a 62 y.o. female coming in with complaint of R shoulder pain.  She is following-up after her last visit on 01/08/18.  She had a R shoulder injection at her last visit, was prescribed Pennsaid and Duexis and given an HEP.  She states that her R shoulder is feeling better w/ less pain.  She states that she has run out of the Duexis.  She states that she uses Pennsaid intermittently.  Overall would state that she is 85% better She reports a new issue of bruising in her L lower leg/ankle and notes that she hit it on her bed on 01/21/18.  She notes that she has a visit to [facility] coming up next week and is worried about travelling w/ that type of bruising.  Bilateral knee pain.  Does have known arthritic changes.  Having increasing instability.  Responded well to Visco supplementation nearly 1 year ago.  Starting to have swelling.  Will be walking a lot and would like to have more pain-free.     Past Medical History:  Diagnosis Date  . ALLERGIC RHINITIS 05/09/2008  . Allergy   . ANXIETY 05/23/2007  . ARM PAIN, LEFT 10/23/2009  . Asthma   . ASTHMA 05/23/2007  . Cardiomyopathy ([place]) 09/08/2015  . CHF (congestive heart failure) ([place])   . CONTACT DERMATITIS 06/09/2009  . Diabetes mellitus   . ENDOMETRIOSIS NOS 05/23/2007  . FACIAL PAIN 06/02/2010  . FREQUENCY, URINARY 05/17/2010  . GLUCOSE INTOLERANCE 08/28/2009  . HERPES SIMPLEX, UNCOMPLICATED 6/62/9476  . Hyperlipidemia 08/28/2015  . HYPERTHYROIDISM 05/23/2007  . Hypoactive thyroid   . HYPOTHYROIDISM 05/09/2008  . Impaired glucose tolerance 01/28/2011  . INSOMNIA, HX OF 05/23/2007  . LATERAL EPICONDYLITIS, RIGHT 03/10/2009  . LIPOMA 10/22/2010  . NECK PAIN 11/10/2010  . OBESITY  05/23/2007  . Plantar fasciitis    Both feet  . SHOULDER PAIN, LEFT 05/17/2010  . SINUSITIS- ACUTE-NOS 11/03/2008  . SKIN LESION 11/10/2010  . Sleep apnea   . Unspecified Chest Pain 07/25/2008  . UNSPECIFIED URTICARIA 06/04/2009  . URI 08/28/2009  . UTI 04/29/2008  . VITAMIN D DEFICIENCY 08/28/2009  . Wheezing 07/12/2010   Past Surgical History:  Procedure Laterality Date  . COLONOSCOPY    . ENDOMETRIAL ABLATION    . LAMINECTOMY  1996  . LAPAROTOMY     exploratory  . POLYPECTOMY    . s/p endometrial ablation  12/06  . TUBAL LIGATION    . UTERINE FIBROID SURGERY    . WISDOM TOOTH EXTRACTION     Social History   Socioeconomic History  . Marital status: Divorced    Spouse name: Not on file  . Number of children: 3  . Years of education: Not on file  . Highest education level: Not on file  Occupational History  . Occupation: SOCIAL WORKER    Employer: [employer]    Comment: [employer]  . Financial resource strain: Not on file  . Food insecurity:    Worry: Not on file    Inability: Not on file  . Transportation needs:    Medical: Not on file    Non-medical: Not on file  Tobacco Use  . Smoking status: Former Smoker  Packs/day: 0.50    Types: Cigarettes    Last attempt to quit: 11/05/1987    Years since quitting: 30.2  . Smokeless tobacco: Never Used  Substance and Sexual Activity  . Alcohol use: No    Alcohol/week: 0.6 oz    Types: 1 Glasses of wine per week    Comment: WINE  . Drug use: No  . Sexual activity: Not on file  Lifestyle  . Physical activity:    Days per week: Not on file    Minutes per session: Not on file  . Stress: Not on file  Relationships  . Social connections:    Talks on phone: Not on file    Gets together: Not on file    Attends religious service: Not on file    Active member of club or organization: Not on file    Attends meetings of clubs or organizations: Not on file    Relationship status: Not on file    Other Topics Concern  . Not on file  Social History Narrative  . Not on file   Allergies  Allergen Reactions  . Ivp Dye [Iodinated Diagnostic Agents] Shortness Of Breath and Other (See Comments)    Swelling of extremity of the injection site  . Latex Itching    ITCHING AND COLD SORES AROUND MOUTH WHEN LATEX GLOVES USED BY THE DENTIST/HYGENTIST  . Clarithromycin Nausea And Vomiting    REACTION: nausea  vomiting  . Metronidazole Nausea And Vomiting  . Zostavax [Zoster Vaccine Live]    Family History  Problem Relation Age of Onset  . Diabetes Cousin   . Hypertension Cousin   . Heart disease Cousin   . Heart disease Mother        Rheumatic heart disease  . COPD Father   . Hypertension Father   . Cancer Maternal Grandmother        Breast  . Diabetes Sister   . Kidney disease Sister      Past medical history, social, surgical and family history all reviewed in electronic medical record.  No pertanent information unless stated regarding to the chief complaint.   Review of Systems:Review of systems updated and as accurate as of 02/02/18  No headache, visual changes, nausea, vomiting, diarrhea, constipation, dizziness, abdominal pain, skin rash, fevers, chills, night sweats, weight loss, swollen lymph nodes, body aches, joint swelling, muscle aches, chest pain, shortness of breath, mood changes.   Objective   Blood pressure 114/78, pulse 83, height 5\' 4"  (1.626 m), weight 245 lb 3.2 oz (111.2 kg), SpO2 96 %.  Systems examined below as of 02/02/18   General: No apparent distress alert and oriented x3 mood and affect normal, dressed appropriately.  HEENT: Pupils equal, extraocular movements intact  Respiratory: Patient's speak in full sentences and does not appear short of breath  Cardiovascular: No lower extremity edema, non tender, no erythema  Skin: Warm dry intact with no signs of infection or rash on extremities or on axial skeleton.  Abdomen: Soft nontender  Neuro: Cranial  nerves II through XII are intact, neurovascularly intact in all extremities with 2+ DTRs and 2+ pulses.  Lymph: No lymphadenopathy of posterior or anterior cervical chain or axillae bilaterally.  Gait normal with good balance and coordination.  MSK:  Non tender with full range of motion and good stability and symmetric strength and tone of  elbows, wrist, hip, and ankles bilaterally.  Right shoulder still shows some positive impingement but near full range of motion.  Mild  positive O'Brien sign also noted.  Strength of symmetric to contralateral side.  Back exam shows stiffness in all planes.  Patient has negative straight leg test.  Positive Faber on the right side.  Tenderness over the paraspinal musculature lumbar spine diffusely.  Knee: Bilateral valgus deformity noted. Large thigh to calf ratio.  Tender to palpation over medial and PF joint line.  ROM full in flexion and extension and lower leg rotation. instability with valgus force.  painful patellar compression. Patellar glide with moderate crepitus. Patellar and quadriceps tendons unremarkable. Hamstring and quadriceps strength is normal.  After informed written and verbal consent, patient was seated on exam table. Right knee was prepped with alcohol swab and utilizing anterolateral approach, patient's right knee space was injected with 4:1  marcaine 0.5%: Kenalog 40mg /dL. Patient tolerated the procedure well without immediate complications.  After informed written and verbal consent, patient was seated on exam table. Left knee was prepped with alcohol swab and utilizing anterolateral approach, patient's left knee space was injected with 4:1  marcaine 0.5%: Kenalog 40mg /dL. Patient tolerated the procedure well without immediate complications.    Impression and Recommendations:     This case required medical decision making of moderate complexity.      Note: This dictation was prepared with Dragon dictation along with smaller  phrase technology. Any transcriptional errors that result from this process are unintentional.

## 2018-02-03 ENCOUNTER — Encounter: Payer: Self-pay | Admitting: Family Medicine

## 2018-02-03 NOTE — Assessment & Plan Note (Signed)
Has had radicular symptoms previously.  Will monitor closely.  Continue conservative therapy.  Discussed core strength and weight loss.  Patient has muscle relaxers.  Follow-up in 4 weeks

## 2018-02-03 NOTE — Assessment & Plan Note (Signed)
Worsening symptoms.  Responded well to injections of.  Could be a candidate for Visco supplementation again.  Discussed which activities to doing which wants to avoid.  Increase activity as tolerated.  Encourage weight loss.  Follow-up again in 4 to 6 weeks

## 2018-02-03 NOTE — Assessment & Plan Note (Signed)
Improved but not completely resolved.  Discussed icing regimen.  Discussed topical anti-inflammatories.  Declined formal physical therapy.  Continue to monitor

## 2018-02-21 ENCOUNTER — Other Ambulatory Visit: Payer: Self-pay | Admitting: Family Medicine

## 2018-03-06 ENCOUNTER — Other Ambulatory Visit: Payer: Self-pay | Admitting: Internal Medicine

## 2018-03-08 ENCOUNTER — Other Ambulatory Visit: Payer: Self-pay | Admitting: Internal Medicine

## 2018-03-21 ENCOUNTER — Telehealth: Payer: Self-pay | Admitting: *Deleted

## 2018-03-21 NOTE — Telephone Encounter (Signed)
Copied from [place] [id number]. Topic: General - Other >> Mar 21, 2018 10:27 AM Yvette Rack wrote: Reason for CRM: Pt states she received several letters from her insurance company requesting additional information regarding the visit on 02/02/18 with Dr. Tamala Julian. Pt states the letter is requesting additional information with a description of services or supplies furnished. Pt requests that the information is submitted to the insurance company as soon as possible to avoid her being charged for the visit. Cb# [id number].

## 2018-03-21 NOTE — Telephone Encounter (Signed)
lmovm for pt to return call.  

## 2018-04-25 DIAGNOSIS — I5032 Chronic diastolic (congestive) heart failure: Secondary | ICD-10-CM | POA: Insufficient documentation

## 2018-04-27 ENCOUNTER — Other Ambulatory Visit: Payer: Self-pay | Admitting: Internal Medicine

## 2018-05-04 ENCOUNTER — Ambulatory Visit: Payer: Managed Care, Other (non HMO) | Admitting: Family

## 2018-05-04 ENCOUNTER — Encounter: Payer: Self-pay | Admitting: Family

## 2018-05-04 VITALS — BP 130/82 | HR 51 | Temp 98.6°F | Ht 64.0 in | Wt 244.1 lb

## 2018-05-04 DIAGNOSIS — R7303 Prediabetes: Secondary | ICD-10-CM | POA: Diagnosis not present

## 2018-05-04 DIAGNOSIS — B372 Candidiasis of skin and nail: Secondary | ICD-10-CM

## 2018-05-04 DIAGNOSIS — J45909 Unspecified asthma, uncomplicated: Secondary | ICD-10-CM | POA: Diagnosis not present

## 2018-05-04 LAB — POCT GLYCOSYLATED HEMOGLOBIN (HGB A1C): Hemoglobin A1C: 5.7 % — AB (ref 4.0–5.6)

## 2018-05-04 MED ORDER — SULFAMETHOXAZOLE-TRIMETHOPRIM 800-160 MG PO TABS: 1.0000 | ORAL_TABLET | Freq: Two times a day (BID) | ORAL | 0 refills | Status: DC

## 2018-05-04 MED ORDER — ALBUTEROL SULFATE HFA 108 (90 BASE) MCG/ACT IN AERS: 2.0000 | INHALATION_SPRAY | RESPIRATORY_TRACT | 3 refills | Status: DC | PRN

## 2018-05-04 MED ORDER — BUDESONIDE-FORMOTEROL FUMARATE 160-4.5 MCG/ACT IN AERO: INHALATION_SPRAY | RESPIRATORY_TRACT | 6 refills | Status: DC

## 2018-05-04 MED ORDER — NYSTATIN-TRIAMCINOLONE 100000-0.1 UNIT/GM-% EX CREA
1.0000 | TOPICAL_CREAM | Freq: Two times a day (BID) | CUTANEOUS | 0 refills | Status: DC
Start: 2018-05-04 — End: 2018-09-18

## 2018-05-04 NOTE — Progress Notes (Signed)
Suzanne Hansen is a 62 y.o. female with the following history as recorded in EpicCare:  Patient Active Problem List   Diagnosis Date Noted  . Acute bursitis of right shoulder 01/08/2018  . Contusion of foot 01/08/2018  . Hyperglycemia 09/27/2017  . GERD (gastroesophageal reflux disease) 09/27/2017  . Acute shoulder bursitis, left 08/28/2017  . Lumbosacral radiculopathy at L4 06/14/2017  . Easy bruising 03/29/2017  . Left leg paresthesias 03/29/2017  . Muscle cramping 03/20/2017  . 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 Pain in lower jaw 11/10/2016  . Cough 11/06/2016  . Vaginitis 09/17/2016  . Left wrist pain 09/16/2016  . Hypersomnia with sleep apnea 02/15/2016  . 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 Extrinsic asthma 02/15/2016  . Ankle edema 02/15/2016  . Degenerative arthritis of knee, bilateral 01/21/2016  . Asthma with exacerbation 10/20/2015  . Acute sinus infection 09/29/2015  . Cardiomyopathy ([place]) 09/08/2015  . Bilateral calf pain 08/31/2015  . Hyperlipidemia 08/28/2015  . Left knee pain 08/28/2015  . Varicose veins with pain 08/28/2015  . Peripheral edema 08/13/2015  . Injection site reaction 06/02/2015  . PHN (postherpetic neuralgia) 05/29/2015  . Unspecified asthma, with exacerbation 07/01/2014  . Shingles 06/10/2014  . Skin nodule 11/27/2012  . Diabetes ([place]) 01/28/2011  . Encounter for preventative adult health care exam with abnormal findings 01/28/2011  . LIPOMA 10/22/2010  . VITAMIN D DEFICIENCY 08/28/2009  . Hypothyroidism 05/09/2008  . Allergic rhinitis 05/09/2008  . HERPES ZOSTER W/NERVOUS COMPLICATION NEC 78/24/2353  . HYPERTHYROIDISM 05/23/2007  . OBESITY 05/23/2007  . Anxiety state 05/23/2007  . Asthma 05/23/2007  . ENDOMETRIOSIS NOS 05/23/2007  . INSOMNIA, HX OF 05/23/2007    Current Outpatient Medications  Medication Sig Dispense Refill  . albuterol (PROVENTIL HFA;VENTOLIN HFA) 108 (90 Base) MCG/ACT inhaler Inhale 2 puffs into the  lungs every 4 (four) hours as needed for wheezing or shortness of breath. 1 Inhaler 3  . aspirin 81 MG EC tablet Take 81 mg by mouth daily.      . budesonide-formoterol (SYMBICORT) 160-4.5 MCG/ACT inhaler INHALE 2 PUFFS INTO THE LUNGS TWICE DAILY 10.2 g 6  . carvedilol (COREG) 3.125 MG tablet Take 1 tablet (3.125 mg total) by mouth 2 (two) times daily. 60 tablet 0  . chlorpheniramine-HYDROcodone (TUSSIONEX PENNKINETIC ER) 10-8 MG/5ML SUER Take 5 mLs by mouth at bedtime as needed for cough. 75 mL 0  . Cholecalciferol (VITAMIN D) 2000 UNITS CAPS Take 2 capsules by mouth daily.    . cyclobenzaprine (FLEXERIL) 10 MG tablet Take 10 mg by mouth every 8 (eight) hours as needed.  0  . cyclobenzaprine (FLEXERIL) 5 MG tablet Take 5 mg by mouth 3 (three) times daily.  0  . Diclofenac Sodium (PENNSAID) 2 % SOLN Place 2 application 2 (two) times daily onto the skin. 112 g 3  . Diclofenac Sodium 2 % SOLN Place 1 application onto the skin 2 (two) times daily. 112 g 3  . fexofenadine (ALLEGRA) 180 MG tablet Take 180 mg by mouth daily.      . fluconazole (DIFLUCAN) 150 MG tablet 1 tab by mouth every 3 days as needed 2 tablet 1  . fluticasone (FLONASE) 50 MCG/ACT nasal spray Place 2 sprays into both nostrils daily. 16 g 6  . gabapentin (NEURONTIN) 300 MG capsule TAKE 1 CAPSULE BY MOUTH THREE TIMES A DAY 90 capsule 1  . KLOR-CON M20 20 MEQ tablet TAKE 2 TABLETS (40 MEQ TOTAL) BY MOUTH DAILY  3  . levofloxacin (LEVAQUIN) 500 MG tablet  Take 1 tablet (500 mg total) by mouth daily. 10 tablet 0  . levothyroxine (SYNTHROID, LEVOTHROID) 112 MCG tablet TAKE 1 TABLET BY MOUTH EVERY DAY 90 tablet 0  . losartan (COZAAR) 50 MG tablet TAKE 1 TABLET BY MOUTH EVERY DAY 30 tablet 0  . magnesium oxide (MAG-OX) 400 MG tablet Take 400 mg by mouth daily.    . Magnesium Oxide 500 MG TABS Take by mouth.    . meloxicam (MOBIC) 15 MG tablet TAKE 1 TABLET BY MOUTH EVERY DAY 90 tablet 2  . metolazone (ZAROXOLYN) 2.5 MG tablet TK 1 T PO D.  DO NOT TAKE UNLESS INSTRUCTED BY YOUR HEART FAILURE PROVIDER  0  . Multiple Vitamin (MULTIVITAMIN) capsule Take 1 capsule by mouth daily.      [name] omeprazole (PRILOSEC) 20 MG capsule Take 1 capsule (20 mg total) by mouth daily. 90 capsule 3  . potassium chloride (KLOR-CON) 8 MEQ tablet Take 8 mEq daily by mouth.    . predniSONE (DELTASONE) 10 MG tablet 3 tabs by mouth per day for 3 days,2tabs per day for 3 days,1tab per day for 3 days 18 tablet 0  . rosuvastatin (CRESTOR) 10 MG tablet TAKE 1 TABLET BY MOUTH EVERY DAY 90 tablet 3  . torsemide (DEMADEX) 10 MG tablet Take by mouth.    . torsemide (DEMADEX) 20 MG tablet Take 60mg  in the am and 40mg  in the pm.    . valACYclovir (VALTREX) 1000 MG tablet Take 500 mg by mouth daily.  0  . Vitamin D, Ergocalciferol, (DRISDOL) 50000 units CAPS capsule TAKE 1 CAPSULE (50,000 UNITS TOTAL) EVERY 7 (SEVEN) DAYS BY MOUTH. 12 capsule 0  . nystatin-triamcinolone (MYCOLOG II) cream Apply 1 application topically 2 (two) times daily. 60 g 0  . sulfamethoxazole-trimethoprim (BACTRIM DS,SEPTRA DS) 800-160 MG tablet Take 1 tablet by mouth 2 (two) times daily. 10 tablet 0   No current facility-administered medications for this visit.     Allergies: Ivp dye [iodinated diagnostic agents]; Latex; Clarithromycin; Metronidazole; and Zostavax [zoster vaccine live]  Past Medical History:  Diagnosis Date  . ALLERGIC RHINITIS 05/09/2008  . Allergy   . ANXIETY 05/23/2007  . ARM PAIN, LEFT 10/23/2009  . Asthma   . ASTHMA 05/23/2007  . Cardiomyopathy ([place]) 09/08/2015  . CHF (congestive heart failure) ([place])   . CONTACT DERMATITIS 06/09/2009  . Diabetes mellitus   . ENDOMETRIOSIS NOS 05/23/2007  . FACIAL PAIN 06/02/2010  . FREQUENCY, URINARY 05/17/2010  . GLUCOSE INTOLERANCE 08/28/2009  . HERPES SIMPLEX, UNCOMPLICATED 01/25/4080  . Hyperlipidemia 08/28/2015  . HYPERTHYROIDISM 05/23/2007  . Hypoactive thyroid   . HYPOTHYROIDISM 05/09/2008  . Impaired glucose tolerance 01/28/2011  .  INSOMNIA, HX OF 05/23/2007  . LATERAL EPICONDYLITIS, RIGHT 03/10/2009  . LIPOMA 10/22/2010  . NECK PAIN 11/10/2010  . OBESITY 05/23/2007  . Plantar fasciitis    Both feet  . SHOULDER PAIN, LEFT 05/17/2010  . SINUSITIS- ACUTE-NOS 11/03/2008  . SKIN LESION 11/10/2010  . Sleep apnea   . Unspecified Chest Pain 07/25/2008  . UNSPECIFIED URTICARIA 06/04/2009  . URI 08/28/2009  . UTI 04/29/2008  . VITAMIN D DEFICIENCY 08/28/2009  . Wheezing 07/12/2010    Past Surgical History:  Procedure Laterality Date  . COLONOSCOPY    . ENDOMETRIAL ABLATION    . LAMINECTOMY  1996  . LAPAROTOMY     exploratory  . POLYPECTOMY    . s/p endometrial ablation  12/06  . TUBAL LIGATION    . UTERINE FIBROID SURGERY    .  WISDOM TOOTH EXTRACTION      Family History  Problem Relation Age of Onset  . Diabetes Cousin   . Hypertension Cousin   . Heart disease Cousin   . Heart disease Mother        Rheumatic heart disease  . COPD Father   . Hypertension Father   . Cancer Maternal Grandmother        Breast  . Diabetes Sister   . Kidney disease Sister     Social History   Tobacco Use  . Smoking status: Former Smoker    Packs/day: 0.50    Types: Cigarettes    Last attempt to quit: 11/05/1987    Years since quitting: 30.5  . Smokeless tobacco: Never Used  Substance Use Topics  . Alcohol use: No    Alcohol/week: 0.6 oz    Types: 1 Glasses of wine per week    Comment: WINE    Subjective:  Patient presents with rash in both of her armpits x 1-2 weeks; + itchy/ burning; denies any new soaps, foods, detergents or medications.  Requesting updated Rx for Symbicort/ albuterol; Would like to get her Hgba1c checked today;    Objective:  Vitals:   05/04/18 1331  BP: 130/82  Pulse: (!) 51  Temp: 98.6 F (37 C)  TempSrc: Oral  SpO2: 99%  Weight: 244 lb 1.9 oz (110.7 kg)  Height: 5\' 4"  (1.626 m)    General: Well developed, well nourished, in no acute distress  Skin : Warm and dry. C/w candidal  intertrigo Head: Normocephalic and atraumatic  Eyes: Sclera and conjunctiva clear; pupils round and reactive to light; extraocular movements intact  Ears: External normal; canals clear; tympanic membranes normal  Oropharynx: Pink, supple. No suspicious lesions  Neck: Supple without thyromegaly, adenopathy  Lungs: Respirations unlabored; clear to auscultation bilaterally without wheeze, rales, rhonchi  Neurologic: Alert and oriented; speech intact; face symmetrical; moves all extremities well; CNII-XII intact without focal deficit   Assessment:  1. Candidal intertrigo   2. Pre-diabetes   3. Uncomplicated asthma, unspecified asthma severity, unspecified whether persistent     Plan:  1. Rx for Bactrim DS bid x 5 days; Mycolog II apply bid to affected area; need to keep area dry; 2. Hgba1c at 5.7; work on limiting intake of refined sugars; 3. Refills updated as requested; Schedule with her PCP for CPE in December 2019.  No follow-ups on file.  Orders Placed This Encounter  Procedures  . HgB A1c    Standing Status:   Future    Standing Expiration Date:   05/04/2019  . POCT glycosylated hemoglobin (Hb A1C)    Requested Prescriptions   Signed Prescriptions Disp Refills  . budesonide-formoterol (SYMBICORT) 160-4.5 MCG/ACT inhaler 10.2 g 6    Sig: INHALE 2 PUFFS INTO THE LUNGS TWICE DAILY  . albuterol (PROVENTIL HFA;VENTOLIN HFA) 108 (90 Base) MCG/ACT inhaler 1 Inhaler 3    Sig: Inhale 2 puffs into the lungs every 4 (four) hours as needed for wheezing or shortness of breath.  . nystatin-triamcinolone (MYCOLOG II) cream 60 g 0    Sig: Apply 1 application topically 2 (two) times daily.  [name] sulfamethoxazole-trimethoprim (BACTRIM DS,SEPTRA DS) 800-160 MG tablet 10 tablet 0    Sig: Take 1 tablet by mouth 2 (two) times daily.

## 2018-05-16 ENCOUNTER — Ambulatory Visit: Payer: Managed Care, Other (non HMO) | Admitting: Internal Medicine

## 2018-05-16 ENCOUNTER — Encounter: Payer: Self-pay | Admitting: Internal Medicine

## 2018-05-16 ENCOUNTER — Other Ambulatory Visit (INDEPENDENT_AMBULATORY_CARE_PROVIDER_SITE_OTHER): Payer: Managed Care, Other (non HMO)

## 2018-05-16 VITALS — BP 116/66 | HR 107 | Temp 98.7°F | Ht 64.0 in | Wt 249.0 lb

## 2018-05-16 DIAGNOSIS — M7521 Bicipital tendinitis, right shoulder: Secondary | ICD-10-CM

## 2018-05-16 DIAGNOSIS — Z0001 Encounter for general adult medical examination with abnormal findings: Secondary | ICD-10-CM

## 2018-05-16 DIAGNOSIS — I5022 Chronic systolic (congestive) heart failure: Secondary | ICD-10-CM | POA: Diagnosis not present

## 2018-05-16 DIAGNOSIS — M7072 Other bursitis of hip, left hip: Secondary | ICD-10-CM | POA: Diagnosis not present

## 2018-05-16 DIAGNOSIS — Z114 Encounter for screening for human immunodeficiency virus [HIV]: Secondary | ICD-10-CM

## 2018-05-16 DIAGNOSIS — E119 Type 2 diabetes mellitus without complications: Secondary | ICD-10-CM

## 2018-05-16 LAB — BASIC METABOLIC PANEL
BUN: 31 mg/dL — AB (ref 6–23)
CHLORIDE: 102 meq/L (ref 96–112)
CO2: 30 mEq/L (ref 19–32)
Calcium: 10 mg/dL (ref 8.4–10.5)
Creatinine, Ser: 1.99 mg/dL — ABNORMAL HIGH (ref 0.40–1.20)
GFR: 32.75 mL/min — AB (ref >60.00)
Glucose, Bld: 106 mg/dL — ABNORMAL HIGH (ref 70–99)
POTASSIUM: 4.1 meq/L (ref 3.5–5.1)
Sodium: 140 mEq/L (ref 135–145)

## 2018-05-16 LAB — HIV ANTIBODY (ROUTINE TESTING W REFLEX): HIV: NONREACTIVE

## 2018-05-16 LAB — CBC WITH DIFFERENTIAL/PLATELET
BASOS ABS: 0.1 10*3/uL (ref 0.0–0.1)
BASOS PCT: 1.1 % (ref 0.0–3.0)
EOS PCT: 1.2 % (ref 0.0–5.0)
Eosinophils Absolute: 0.1 10*3/uL (ref 0.0–0.7)
HEMATOCRIT: 36.3 % (ref 36.0–46.0)
Hemoglobin: 12.2 g/dL (ref 12.0–15.0)
Lymphocytes Relative: 36.6 % (ref 12.0–46.0)
Lymphs Abs: 2.5 10*3/uL (ref 0.7–4.0)
MCHC: 33.5 g/dL (ref 30.0–36.0)
MCV: 92.3 fl (ref 78.0–100.0)
MONOS PCT: 9.2 % (ref 3.0–12.0)
Monocytes Absolute: 0.6 10*3/uL (ref 0.1–1.0)
NEUTROS ABS: 3.6 10*3/uL (ref 1.4–7.7)
NEUTROS PCT: 51.9 % (ref 43.0–77.0)
PLATELETS: 276 10*3/uL (ref 150.0–400.0)
RBC: 3.94 Mil/uL (ref 3.87–5.11)
RDW: 13.3 % (ref 11.5–15.5)
WBC: 6.8 10*3/uL (ref 4.0–10.5)

## 2018-05-16 LAB — URINALYSIS, ROUTINE W REFLEX MICROSCOPIC
BILIRUBIN URINE: NEGATIVE
Hgb urine dipstick: NEGATIVE
KETONES UR: NEGATIVE
Leukocytes, UA: NEGATIVE
NITRITE: NEGATIVE
PH: 6 (ref 5.0–8.0)
RBC / HPF: NONE SEEN (ref 0–?)
Specific Gravity, Urine: <=1.005 — AB (ref 1.000–1.030)
TOTAL PROTEIN, URINE-UPE24: NEGATIVE
Urine Glucose: NEGATIVE
Urobilinogen, UA: 0.2 (ref 0.0–1.0)

## 2018-05-16 LAB — HEPATIC FUNCTION PANEL
ALBUMIN: 4.1 g/dL (ref 3.5–5.2)
ALT: 19 U/L (ref 0–35)
AST: 14 U/L (ref 0–37)
Alkaline Phosphatase: 72 U/L (ref 39–117)
BILIRUBIN DIRECT: 0.1 mg/dL (ref 0.0–0.3)
TOTAL PROTEIN: 7.1 g/dL (ref 6.0–8.3)
Total Bilirubin: 0.5 mg/dL (ref 0.2–1.2)

## 2018-05-16 LAB — MICROALBUMIN / CREATININE URINE RATIO
Creatinine,U: 82.3 mg/dL
Microalb Creat Ratio: 0.9 mg/g (ref 0.0–30.0)
Microalb, Ur: <0.7 mg/dL (ref 0.0–1.9)

## 2018-05-16 LAB — TSH: TSH: 0.41 u[IU]/mL (ref 0.35–4.50)

## 2018-05-16 LAB — LIPID PANEL
CHOLESTEROL: 144 mg/dL (ref 0–200)
HDL: 81.1 mg/dL (ref >39.00)
LDL Cholesterol: 50 mg/dL (ref 0–99)
NonHDL: 63.24
TRIGLYCERIDES: 66 mg/dL (ref 0.0–149.0)
Total CHOL/HDL Ratio: 2
VLDL: 13.2 mg/dL (ref 0.0–40.0)

## 2018-05-16 MED ORDER — PREDNISONE 20 MG PO TABS: 20.0000 mg | ORAL_TABLET | Freq: Every day | ORAL | 0 refills | Status: DC

## 2018-05-16 MED ORDER — TRAMADOL HCL 50 MG PO TABS: 50.0000 mg | ORAL_TABLET | Freq: Three times a day (TID) | ORAL | 0 refills | Status: DC | PRN

## 2018-05-16 NOTE — Assessment & Plan Note (Signed)
Mild, for pain control, refer sports med in this office

## 2018-05-16 NOTE — Patient Instructions (Signed)
Please take all new medication as prescribed - the tramadol and prednisone  Please continue all other medications as before, and refills have been done if requested.  Please have the pharmacy call with any other refills you may need.  Please continue your efforts at being more active, low cholesterol diet, and weight control.  You are otherwise up to date with prevention measures today.  Please keep your appointments with your specialists as you may have planned  Please go to the LAB in the Basement (turn left off the elevator) for the tests to be done today  You will be contacted by phone if any changes need to be made immediately.  Otherwise, you will receive a letter about your results with an explanation, but please check with MyChart first.  Please remember to sign up for MyChart if you have not done so, as this will be important to you in the future with finding out test results, communicating by private email, and scheduling acute appointments online when needed.  Please return in 6 months, or sooner if needed, with Lab testing done 3-5 days before

## 2018-05-16 NOTE — Assessment & Plan Note (Signed)

## 2018-05-16 NOTE — Assessment & Plan Note (Signed)
Lab Results  Component Value Date   HGBA1C 5.7 (A) 05/04/2018  stable overall by history and exam, recent data reviewed with pt, and pt to continue medical treatment as before,  to f/u any worsening symptoms or concerns

## 2018-05-16 NOTE — Assessment & Plan Note (Addendum)
Mild, for pain control, for sports medicine referral for cortisone  In addition to the time spent performing CPE, I spent an additional 25 minutes face to face,in which greater than 50% of this time was spent in counseling and coordination of care for patient's acute illness as documented, including the differential dx, treatment, further evaluation and other management of right bicipital tendonitis, left hip bursitis, DM and CHF

## 2018-05-16 NOTE — Progress Notes (Signed)
Subjective:    Patient ID: Suzanne Hansen, female    DOB: 07-01-1956, 62 y.o.   MRN: [id number]  HPI  Here for wellness and f/u;  Overall doing ok;  Pt denies Chest pain, worsening SOB, DOE, wheezing, orthopnea, PND, worsening LE edema, palpitations, dizziness or syncope.  Pt denies neurological change such as new headache, facial or extremity weakness.  Pt denies polydipsia, polyuria, or low sugar symptoms. Pt states overall good compliance with treatment and medications, good tolerability, and has been trying to follow appropriate diet.  Pt denies worsening depressive symptoms, suicidal ideation or panic. No fever, night sweats, wt loss, loss of appetite, or other constitutional symptoms.  Pt states good ability with ADL's, has low fall risk, home safety reviewed and adequate, no other significant changes in hearing or vision, and only occasionally active with exercise. Also c/o right shoulder pain for 1 -2 wks, mostly anterior, mild nagging, constant, any movement of the shoudler, driving with right hand, sleeping on the right side all make worse.  Nothing else makes better or worse.  Has some some minor left shoulder post pain  Also with left hip pain and points to the area over the left greater trochanter, worse to sleep on left side, mild to mod, intemittent, mobic and tyelnol not helping. Pt continues to have recurring LBP without change in severity, bowel or bladder change, fever, wt loss,  worsening LE pain/numbness/weakness, gait change or falls, s/p ESI x 2 one to right and left, gabapentin does help mostly at night with being able to sleep.   Past Medical History:  Diagnosis Date  . ALLERGIC RHINITIS 05/09/2008  . Allergy   . ANXIETY 05/23/2007  . ARM PAIN, LEFT 10/23/2009  . Asthma   . ASTHMA 05/23/2007  . Cardiomyopathy ([place]) 09/08/2015  . CHF (congestive heart failure) ([place])   . CONTACT DERMATITIS 06/09/2009  . Diabetes mellitus   . ENDOMETRIOSIS NOS 05/23/2007  . FACIAL PAIN 06/02/2010    . FREQUENCY, URINARY 05/17/2010  . GLUCOSE INTOLERANCE 08/28/2009  . HERPES SIMPLEX, UNCOMPLICATED 8/33/8250  . Hyperlipidemia 08/28/2015  . HYPERTHYROIDISM 05/23/2007  . Hypoactive thyroid   . HYPOTHYROIDISM 05/09/2008  . Impaired glucose tolerance 01/28/2011  . INSOMNIA, HX OF 05/23/2007  . LATERAL EPICONDYLITIS, RIGHT 03/10/2009  . LIPOMA 10/22/2010  . NECK PAIN 11/10/2010  . OBESITY 05/23/2007  . Plantar fasciitis    Both feet  . SHOULDER PAIN, LEFT 05/17/2010  . SINUSITIS- ACUTE-NOS 11/03/2008  . SKIN LESION 11/10/2010  . Sleep apnea   . Unspecified Chest Pain 07/25/2008  . UNSPECIFIED URTICARIA 06/04/2009  . URI 08/28/2009  . UTI 04/29/2008  . VITAMIN D DEFICIENCY 08/28/2009  . Wheezing 07/12/2010   Past Surgical History:  Procedure Laterality Date  . COLONOSCOPY    . ENDOMETRIAL ABLATION    . LAMINECTOMY  1996  . LAPAROTOMY     exploratory  . POLYPECTOMY    . s/p endometrial ablation  12/06  . TUBAL LIGATION    . UTERINE FIBROID SURGERY    . WISDOM TOOTH EXTRACTION      reports that she quit smoking about 30 years ago. Her smoking use included cigarettes. She smoked 0.50 packs per day. She has never used smokeless tobacco. She reports that she does not drink alcohol or use drugs. family history includes COPD in her father; Cancer in her maternal grandmother; Diabetes in her cousin and sister; Heart disease in her cousin and mother; Hypertension in her cousin and father; Kidney disease in  her sister. Allergies  Allergen Reactions  . Ivp Dye [Iodinated Diagnostic Agents] Shortness Of Breath and Other (See Comments)    Swelling of extremity of the injection site  . Latex Itching    ITCHING AND COLD SORES AROUND MOUTH WHEN LATEX GLOVES USED BY THE DENTIST/HYGENTIST  . Clarithromycin Nausea And Vomiting    REACTION: nausea  vomiting  . Metronidazole Nausea And Vomiting  . Zostavax [Zoster Vaccine Live]    Current Outpatient Medications on File Prior to Visit  Medication Sig  Dispense Refill  . albuterol (PROVENTIL HFA;VENTOLIN HFA) 108 (90 Base) MCG/ACT inhaler Inhale 2 puffs into the lungs every 4 (four) hours as needed for wheezing or shortness of breath. 1 Inhaler 3  . aspirin 81 MG EC tablet Take 81 mg by mouth daily.      . budesonide-formoterol (SYMBICORT) 160-4.5 MCG/ACT inhaler INHALE 2 PUFFS INTO THE LUNGS TWICE DAILY 10.2 g 6  . carvedilol (COREG) 3.125 MG tablet Take 1 tablet (3.125 mg total) by mouth 2 (two) times daily. 60 tablet 0  . Cholecalciferol (VITAMIN D) 2000 UNITS CAPS Take 2 capsules by mouth daily.    . fexofenadine (ALLEGRA) 180 MG tablet Take 180 mg by mouth daily.      . fluticasone (FLONASE) 50 MCG/ACT nasal spray Place 2 sprays into both nostrils daily. 16 g 6  . gabapentin (NEURONTIN) 300 MG capsule TAKE 1 CAPSULE BY MOUTH THREE TIMES A DAY 90 capsule 1  . KLOR-CON M20 20 MEQ tablet TAKE 2 TABLETS (40 MEQ TOTAL) BY MOUTH DAILY  3  . levothyroxine (SYNTHROID, LEVOTHROID) 112 MCG tablet TAKE 1 TABLET BY MOUTH EVERY DAY 90 tablet 0  . losartan (COZAAR) 50 MG tablet TAKE 1 TABLET BY MOUTH EVERY DAY 30 tablet 0  . Magnesium Oxide 500 MG TABS Take by mouth.    . meloxicam (MOBIC) 15 MG tablet TAKE 1 TABLET BY MOUTH EVERY DAY 90 tablet 2  . Multiple Vitamin (MULTIVITAMIN) capsule Take 1 capsule by mouth daily.      [name] nystatin-triamcinolone (MYCOLOG II) cream Apply 1 application topically 2 (two) times daily. 60 g 0  . omeprazole (PRILOSEC) 20 MG capsule Take 1 capsule (20 mg total) by mouth daily. 90 capsule 3  . potassium chloride (KLOR-CON) 8 MEQ tablet Take 8 mEq daily by mouth.    . rosuvastatin (CRESTOR) 10 MG tablet TAKE 1 TABLET BY MOUTH EVERY DAY 90 tablet 3  . torsemide (DEMADEX) 20 MG tablet Take 60mg  in the am and 40mg  in the pm.    . valACYclovir (VALTREX) 1000 MG tablet Take 500 mg by mouth daily.  0  . Vitamin D, Ergocalciferol, (DRISDOL) 50000 units CAPS capsule TAKE 1 CAPSULE (50,000 UNITS TOTAL) EVERY 7 (SEVEN) DAYS BY MOUTH.  12 capsule 0   No current facility-administered medications on file prior to visit.    Review of Systems Constitutional: Negative for other unusual diaphoresis, sweats, appetite or weight changes HENT: Negative for other worsening hearing loss, ear pain, facial swelling, mouth sores or neck stiffness.   Eyes: Negative for other worsening pain, redness or other visual disturbance.  Respiratory: Negative for other stridor or swelling Cardiovascular: Negative for other palpitations or other chest pain  Gastrointestinal: Negative for worsening diarrhea or loose stools, blood in stool, distention or other pain Genitourinary: Negative for hematuria, flank pain or other change in urine volume.  Musculoskeletal: Negative for myalgias or other joint swelling.  Skin: Negative for other color change, or other wound or  worsening drainage.  Neurological: Negative for other syncope or numbness. Hematological: Negative for other adenopathy or swelling Psychiatric/Behavioral: Negative for hallucinations, other worsening agitation, SI, self-injury, or new decreased concentration All other system neg per pt    Objective:   Physical Exam BP 116/66   Pulse (!) 107   Temp 98.7 F (37.1 C) (Oral)   Ht 5\' 4"  (1.626 m)   Wt 249 lb (112.9 kg)   SpO2 98%   BMI 42.74 kg/m  VS noted,  Constitutional: Pt is oriented to person, place, and time. Appears well-developed and well-nourished, in no significant distress and comfortable Head: Normocephalic and atraumatic  Eyes: Conjunctivae and EOM are normal. Pupils are equal, round, and reactive to light Right Ear: External ear normal without discharge Left Ear: External ear normal without discharge Nose: Nose without discharge or deformity Mouth/Throat: Oropharynx is without other ulcerations and moist  Neck: Normal range of motion. Neck supple. No JVD present. No tracheal deviation present or significant neck LA or mass Cardiovascular: Normal rate, regular  rhythm, normal heart sounds and intact distal pulses.   Pulmonary/Chest: WOB normal and breath sounds without rales or wheezing  Abdominal: Soft. Bowel sounds are normal. NT. No HSM  Musculoskeletal: Normal range of motion. Exhibits no , but has + right bicipital tenden insertion site tender, also tender over left greater trochanter Lymphadenopathy: Has no other cervical adenopathy.  Neurological: Pt is alert and oriented to person, place, and time. Pt has normal reflexes. No cranial nerve deficit. Motor grossly intact, Gait intact Skin: Skin is warm and dry. No rash noted or new ulcerations Psychiatric:  Has normal mood and affect. Behavior is normal without agitation No other exam findings  Lab Results  Component Value Date   WBC 8.3 09/27/2017   HGB 13.4 09/27/2017   HCT 40.3 09/27/2017   PLT 265.0 09/27/2017   GLUCOSE 105 (H) 09/27/2017   CHOL 119 09/27/2017   TRIG 61.0 09/27/2017   HDL 77.50 09/27/2017   LDLCALC 29 09/27/2017   ALT 27 09/27/2017   AST 21 09/27/2017   NA 139 09/27/2017   K 3.9 09/27/2017   CL 101 09/27/2017   CREATININE 1.24 (H) 09/27/2017   BUN 22 09/27/2017   CO2 29 09/27/2017   TSH 0.75 12/25/2017   INR 1.0 03/21/2017   HGBA1C 5.7 (A) 05/04/2018   MICROALBUR 2.2 (H) 03/21/2017        Assessment & Plan:

## 2018-05-16 NOTE — Assessment & Plan Note (Signed)
stable overall by history and exam, recent data reviewed with pt, and pt to continue medical treatment as before,  to f/u any worsening symptoms or concerns, plans to possibly want to change heart care to [facility], currently to continue card at [facility]

## 2018-05-24 ENCOUNTER — Ambulatory Visit: Payer: Managed Care, Other (non HMO) | Admitting: Family Medicine

## 2018-05-24 ENCOUNTER — Encounter: Payer: Self-pay | Admitting: Family Medicine

## 2018-05-24 VITALS — BP 128/84 | HR 73 | Ht 64.0 in | Wt 242.0 lb

## 2018-05-24 DIAGNOSIS — M25552 Pain in left hip: Secondary | ICD-10-CM

## 2018-05-24 NOTE — Progress Notes (Signed)
Neville Pauls - 62 y.o. female MRN [id number]  Date of birth: March 19, 1956  SUBJECTIVE:  Including CC & ROS.  Chief Complaint  Patient presents with  . Hip Pain    Miki Cumby is a 62 y.o. female that is presenting with left hip pain. Pain has been ongoing for one month. She noticed the pain after a vacation when she was getting in and out of a [place]. Pain is located at greater troch. Pain is constant, worse when standing. Localized to her hip. She has been taking Mobic and applying ice/heat. She completed a course of prednisone with no improvement. Denies injuries or surgeries.  Review of Systems  Constitutional: Negative for fever.  HENT: Negative for congestion.   Respiratory: Negative for cough.   Cardiovascular: Negative for chest pain.  Gastrointestinal: Negative for abdominal pain.  Musculoskeletal: Positive for gait problem.  Skin: Negative for color change.  Neurological: Negative for weakness.  Hematological: Negative for adenopathy.  Psychiatric/Behavioral: Negative for agitation.    HISTORY: Past Medical, Surgical, Social, and Family History Reviewed & Updated per EMR.   Pertinent Historical Findings include:  Past Medical History:  Diagnosis Date  . ALLERGIC RHINITIS 05/09/2008  . Allergy   . ANXIETY 05/23/2007  . ARM PAIN, LEFT 10/23/2009  . Asthma   . ASTHMA 05/23/2007  . Cardiomyopathy ([place]) 09/08/2015  . CHF (congestive heart failure) ([place])   . CONTACT DERMATITIS 06/09/2009  . Diabetes mellitus   . ENDOMETRIOSIS NOS 05/23/2007  . FACIAL PAIN 06/02/2010  . FREQUENCY, URINARY 05/17/2010  . GLUCOSE INTOLERANCE 08/28/2009  . HERPES SIMPLEX, UNCOMPLICATED 9/56/2130  . Hyperlipidemia 08/28/2015  . HYPERTHYROIDISM 05/23/2007  . Hypoactive thyroid   . HYPOTHYROIDISM 05/09/2008  . Impaired glucose tolerance 01/28/2011  . INSOMNIA, HX OF 05/23/2007  . LATERAL EPICONDYLITIS, RIGHT 03/10/2009  . LIPOMA 10/22/2010  . NECK PAIN 11/10/2010  . OBESITY 05/23/2007  . Plantar  fasciitis    Both feet  . SHOULDER PAIN, LEFT 05/17/2010  . SINUSITIS- ACUTE-NOS 11/03/2008  . SKIN LESION 11/10/2010  . Sleep apnea   . Unspecified Chest Pain 07/25/2008  . UNSPECIFIED URTICARIA 06/04/2009  . URI 08/28/2009  . UTI 04/29/2008  . VITAMIN D DEFICIENCY 08/28/2009  . Wheezing 07/12/2010    Past Surgical History:  Procedure Laterality Date  . COLONOSCOPY    . ENDOMETRIAL ABLATION    . LAMINECTOMY  1996  . LAPAROTOMY     exploratory  . POLYPECTOMY    . s/p endometrial ablation  12/06  . TUBAL LIGATION    . UTERINE FIBROID SURGERY    . WISDOM TOOTH EXTRACTION      Allergies  Allergen Reactions  . Ivp Dye [Iodinated Diagnostic Agents] Shortness Of Breath and Other (See Comments)    Swelling of extremity of the injection site  . Latex Itching    ITCHING AND COLD SORES AROUND MOUTH WHEN LATEX GLOVES USED BY THE DENTIST/HYGENTIST  . Clarithromycin Nausea And Vomiting    REACTION: nausea  vomiting  . Metronidazole Nausea And Vomiting  . Zostavax [Zoster Vaccine Live]     Family History  Problem Relation Age of Onset  . Diabetes Cousin   . Hypertension Cousin   . Heart disease Cousin   . Heart disease Mother        Rheumatic heart disease  . COPD Father   . Hypertension Father   . Cancer Maternal Grandmother        Breast  . Diabetes Sister   . Kidney disease Sister  Social History   Socioeconomic History  . Marital status: Divorced    Spouse name: Not on file  . Number of children: 3  . Years of education: Not on file  . Highest education level: Not on file  Occupational History  . Occupation: SOCIAL WORKER    Employer: [employer]    Comment: [place]  . Financial resource strain: Not on file  . Food insecurity:    Worry: Not on file    Inability: Not on file  . Transportation needs:    Medical: Not on file    Non-medical: Not on file  Tobacco Use  . Smoking status: Former Smoker    Packs/day: 0.50     Types: Cigarettes    Last attempt to quit: 11/05/1987    Years since quitting: 30.5  . Smokeless tobacco: Never Used  Substance and Sexual Activity  . Alcohol use: No    Alcohol/week: 1.0 standard drinks    Types: 1 Glasses of wine per week    Comment: WINE  . Drug use: No  . Sexual activity: Not on file  Lifestyle  . Physical activity:    Days per week: Not on file    Minutes per session: Not on file  . Stress: Not on file  Relationships  . Social connections:    Talks on phone: Not on file    Gets together: Not on file    Attends religious service: Not on file    Active member of club or organization: Not on file    Attends meetings of clubs or organizations: Not on file    Relationship status: Not on file  . Intimate partner violence:    Fear of current or ex partner: Not on file    Emotionally abused: Not on file    Physically abused: Not on file    Forced sexual activity: Not on file  Other Topics Concern  . Not on file  Social History Narrative  . Not on file     PHYSICAL EXAM:  VS: BP 128/84 (BP Location: Left Arm, Patient Position: Sitting, Cuff Size: Normal)   Pulse 73   Ht 5\' 4"  (1.626 m)   Wt 242 lb (109.8 kg)   SpO2 98%   BMI 41.54 kg/m  Physical Exam Gen: NAD, alert, cooperative with exam, well-appearing ENT: normal lips, normal nasal mucosa,  Eye: normal EOM, normal conjunctiva and lids CV:  no edema, +2 pedal pulses   Resp: no accessory muscle use, non-labored,   Skin: no rashes, no areas of induration  Neuro: normal tone, normal sensation to touch Psych:  normal insight, alert and oriented MSK:  Left hip:  TTP of the GT Weakness with hip abduction  Normal IR and ER  Normal knee flexion and extension strength to resistance  Normal gait  Negative SLR b/l  Neurovascularly intact    Aspiration/Injection Procedure Note Ezella Jech Feb 26, 1956  Procedure: Injection Indications: left hip pain   Procedure Details Consent: Risks of  procedure as well as the alternatives and risks of each were explained to the (patient/caregiver).  Consent for procedure obtained. Time Out: Verified patient identification, verified procedure, site/side was marked, verified correct patient position, special equipment/implants available, medications/allergies/relevent history reviewed, required imaging and test results available.  Performed.  The area was cleaned with iodine and alcohol swabs.    The left greater trochanteric bursa was injected using 1 cc's of 40 mg Depomedrol and 4 cc's of 1% lidocaine  with a 22 3 1/2" needle.  Ultrasound was used. Images were obtained in Transverse views showing the injection.    A sterile dressing was applied.  Patient did tolerate procedure well.          ASSESSMENT & PLAN:   Left hip pain Pain likely related to GT bursitis and has weakness with hip abduction.  - GT bursa injection today  - counseled on HEP  - if no improvement consider PT

## 2018-05-24 NOTE — Patient Instructions (Signed)
Nice to meet you  Please try the exercises  You can please ice on the area.  Please try a bike or swimming for exercise  Please follow up with me in 3-4 weeks if there is no improvement.

## 2018-05-27 DIAGNOSIS — M25552 Pain in left hip: Secondary | ICD-10-CM | POA: Insufficient documentation

## 2018-05-27 NOTE — Assessment & Plan Note (Signed)
Pain likely related to GT bursitis and has weakness with hip abduction.  - GT bursa injection today  - counseled on HEP  - if no improvement consider PT

## 2018-05-29 ENCOUNTER — Telehealth: Payer: Self-pay | Admitting: Internal Medicine

## 2018-05-29 MED ORDER — FLUCONAZOLE 150 MG PO TABS: ORAL_TABLET | ORAL | 1 refills | Status: DC

## 2018-05-29 NOTE — Telephone Encounter (Signed)
LOV 05/16/18 Dr. Jenny Reichmann

## 2018-05-29 NOTE — Telephone Encounter (Signed)
Done erx 

## 2018-05-29 NOTE — Telephone Encounter (Unsigned)
Copied from [place] [id number]. Topic: Quick Communication - See Telephone Encounter >> May 29, 2018  9:14 AM Marja Kays F wrote: Pt has been having issues with possible yeast under her arm and she has seen Jodi Mourning which she has given her cream for which really didi not work but the diflucan that Dr. Jenny Reichmann has given her did  And she would like a refill of diflucan   [facility] [address]   [place] number [phone]

## 2018-06-15 ENCOUNTER — Other Ambulatory Visit: Payer: Self-pay | Admitting: Family

## 2018-06-24 ENCOUNTER — Other Ambulatory Visit: Payer: Self-pay | Admitting: Family Medicine

## 2018-06-25 NOTE — Progress Notes (Signed)
[facility] [place], [place] [address] Phone: [phone] Subjective:    I Suzanne Hansen am serving as a Education administrator for Dr. Hulan Saas.      CC: Back pain follow-up  WCB:[id number]  Suzanne Hansen is a 62 y.o. female coming in with complaint of back pain. Back pain is worse today. Pennsaid is not working. Would like another medication for her back. Hip injection by Dr. Raeford Razor. Also having shin splints. Wearing a wedge in her shoes. Gets cramps in legs with a lot of walking. Completed guided injections for her back. Guided injections didn't last too long.  This is after patient did have an MRI of her spine.  This was independently visualized by me.  MRI showed mild to moderate disc bulging.  Mild arthritic changes.  Patient states overall is having worsening pain.  Mostly in the back.  Looking for other possible treatment options at this time.  Since I have seen patient has had significant cardiomyopathy and congestive heart failure.  Reviewing patient's chart also found to have potential iron deficiency and somewhat uncontrolled diabetes.     Past Medical History:  Diagnosis Date  . ALLERGIC RHINITIS 05/09/2008  . Allergy   . ANXIETY 05/23/2007  . ARM PAIN, LEFT 10/23/2009  . Asthma   . ASTHMA 05/23/2007  . Cardiomyopathy ([place]) 09/08/2015  . CHF (congestive heart failure) ([place])   . CONTACT DERMATITIS 06/09/2009  . Diabetes mellitus   . ENDOMETRIOSIS NOS 05/23/2007  . FACIAL PAIN 06/02/2010  . FREQUENCY, URINARY 05/17/2010  . GLUCOSE INTOLERANCE 08/28/2009  . HERPES SIMPLEX, UNCOMPLICATED 1/60/7371  . Hyperlipidemia 08/28/2015  . HYPERTHYROIDISM 05/23/2007  . Hypoactive thyroid   . HYPOTHYROIDISM 05/09/2008  . Impaired glucose tolerance 01/28/2011  . INSOMNIA, HX OF 05/23/2007  . LATERAL EPICONDYLITIS, RIGHT 03/10/2009  . LIPOMA 10/22/2010  . NECK PAIN 11/10/2010  . OBESITY 05/23/2007  . Plantar fasciitis    Both feet  . SHOULDER PAIN, LEFT  05/17/2010  . SINUSITIS- ACUTE-NOS 11/03/2008  . SKIN LESION 11/10/2010  . Sleep apnea   . Unspecified Chest Pain 07/25/2008  . UNSPECIFIED URTICARIA 06/04/2009  . URI 08/28/2009  . UTI 04/29/2008  . VITAMIN D DEFICIENCY 08/28/2009  . Wheezing 07/12/2010   Past Surgical History:  Procedure Laterality Date  . COLONOSCOPY    . ENDOMETRIAL ABLATION    . LAMINECTOMY  1996  . LAPAROTOMY     exploratory  . POLYPECTOMY    . s/p endometrial ablation  12/06  . TUBAL LIGATION    . UTERINE FIBROID SURGERY    . WISDOM TOOTH EXTRACTION     Social History   Socioeconomic History  . Marital status: Divorced    Spouse name: Not on file  . Number of children: 3  . Years of education: Not on file  . Highest education level: Not on file  Occupational History  . Occupation: SOCIAL WORKER    Employer: [employer]    Comment: [place]  . Financial resource strain: Not on file  . Food insecurity:    Worry: Not on file    Inability: Not on file  . Transportation needs:    Medical: Not on file    Non-medical: Not on file  Tobacco Use  . Smoking status: Former Smoker    Packs/day: 0.50    Types: Cigarettes    Last attempt to quit: 11/05/1987    Years since quitting: 30.6  . Smokeless  tobacco: Never Used  Substance and Sexual Activity  . Alcohol use: No    Alcohol/week: 1.0 standard drinks    Types: 1 Glasses of wine per week    Comment: WINE  . Drug use: No  . Sexual activity: Not on file  Lifestyle  . Physical activity:    Days per week: Not on file    Minutes per session: Not on file  . Stress: Not on file  Relationships  . Social connections:    Talks on phone: Not on file    Gets together: Not on file    Attends religious service: Not on file    Active member of club or organization: Not on file    Attends meetings of clubs or organizations: Not on file    Relationship status: Not on file  Other Topics Concern  . Not on file  Social History  Narrative  . Not on file   Allergies  Allergen Reactions  . Ivp Dye [Iodinated Diagnostic Agents] Shortness Of Breath and Other (See Comments)    Swelling of extremity of the injection site  . Latex Itching    ITCHING AND COLD SORES AROUND MOUTH WHEN LATEX GLOVES USED BY THE DENTIST/HYGENTIST  . Clarithromycin Nausea And Vomiting    REACTION: nausea  vomiting  . Metronidazole Nausea And Vomiting  . Zostavax [Zoster Vaccine Live]    Family History  Problem Relation Age of Onset  . Diabetes Cousin   . Hypertension Cousin   . Heart disease Cousin   . Heart disease Mother        Rheumatic heart disease  . COPD Father   . Hypertension Father   . Cancer Maternal Grandmother        Breast  . Diabetes Sister   . Kidney disease Sister     Current Outpatient Medications (Endocrine & Metabolic):  .  levothyroxine (SYNTHROID, LEVOTHROID) 112 MCG tablet, TAKE 1 TABLET BY MOUTH EVERY DAY .  predniSONE (DELTASONE) 10 MG tablet, 2 tabs by mouth per day for 5 days  Current Outpatient Medications (Cardiovascular):  .  losartan (COZAAR) 50 MG tablet, TAKE 1 TABLET BY MOUTH EVERY DAY .  rosuvastatin (CRESTOR) 10 MG tablet, TAKE 1 TABLET BY MOUTH EVERY DAY .  torsemide (DEMADEX) 20 MG tablet, 60 mg in the morning and evening time.  Current Outpatient Medications (Respiratory):  .  albuterol (PROVENTIL HFA;VENTOLIN HFA) 108 (90 Base) MCG/ACT inhaler, Inhale 2 puffs into the lungs every 4 (four) hours as needed for wheezing or shortness of breath. .  budesonide-formoterol (SYMBICORT) 160-4.5 MCG/ACT inhaler, INHALE 2 PUFFS INTO THE LUNGS TWICE DAILY .  chlorpheniramine-HYDROcodone (TUSSIONEX PENNKINETIC ER) 10-8 MG/5ML SUER, Take 5 mLs by mouth every 12 (twelve) hours as needed for cough. .  fexofenadine (ALLEGRA) 180 MG tablet, Take 180 mg by mouth daily.   .  fluticasone (FLONASE) 50 MCG/ACT nasal spray, Place 2 sprays into both nostrils daily.  Current Outpatient Medications (Analgesics):    .  aspirin 81 MG EC tablet, Take 81 mg by mouth daily.   .  traMADol (ULTRAM) 50 MG tablet, Take 1 tablet (50 mg total) by mouth every 8 (eight) hours as needed.   Current Outpatient Medications (Other):  .  azithromycin (ZITHROMAX Z-PAK) 250 MG tablet, 2 tab by mouth day 1, then 1 per day .  Cholecalciferol (VITAMIN D) 2000 UNITS CAPS, Take 2 capsules by mouth daily. .  fluconazole (DIFLUCAN) 150 MG tablet, 1 tab by mouth every 3 days  as needed .  gabapentin (NEURONTIN) 300 MG capsule, TAKE 1 CAPSULE BY MOUTH THREE TIMES A DAY .  ketoconazole (NIZORAL) 2 % cream, Apply 1 application topically daily. [name]  KLOR-CON M20 20 MEQ tablet, TAKE 2 TABLETS (40 MEQ TOTAL) BY MOUTH DAILY .  Magnesium Oxide 500 MG TABS, Take by mouth. .  Multiple Vitamin (MULTIVITAMIN) capsule, Take 1 capsule by mouth daily.   [name]  nystatin-triamcinolone (MYCOLOG II) cream, Apply 1 application topically 2 (two) times daily. [name]  omeprazole (PRILOSEC) 20 MG capsule, Take 1 capsule (20 mg total) by mouth daily. .  potassium chloride (KLOR-CON) 8 MEQ tablet, Take 8 mEq daily by mouth. .  valACYclovir (VALTREX) 1000 MG tablet, Take 500 mg by mouth daily. .  Vitamin D, Ergocalciferol, (DRISDOL) 50000 units CAPS capsule, TAKE 1 CAPSULE (50,000 UNITS TOTAL) EVERY 7 (SEVEN) DAYS BY MOUTH. .  DULoxetine (CYMBALTA) 20 MG capsule, Take 1 capsule (20 mg total) by mouth daily.    Past medical history, social, surgical and family history all reviewed in electronic medical record.  No pertanent information unless stated regarding to the chief complaint.   Review of Systems:  No headache, visual changes, nausea, vomiting, diarrhea, constipation, dizziness, abdominal pain, skin rash, fevers, chills, night sweats, weight loss, swollen lymph nodes, body aches, joint swelling,  chest pain, shortness of breath, mood changes.  Positive muscle aches  Objective  Blood pressure 116/90, pulse 72, height 5\' 4"  (1.626 m), weight 251 lb (113.9 kg),  SpO2 92 %.    General: No apparent distress alert and oriented x3 mood and affect normal, dressed appropriately.  HEENT: Pupils equal, extraocular movements intact  Respiratory: Patient's speak in full sentences and does not appear short of breath  Cardiovascular: Trace lower extremity edema, non tender, no erythema  Skin: Warm dry intact with no signs of infection or rash on extremities or on axial skeleton.  Abdomen: Soft mild diffusely tender Neuro: Cranial nerves II through XII are intact, neurovascularly intact in all extremities with 2+ DTRs and 2+ pulses.  Lymph: No lymphadenopathy of posterior or anterior cervical chain or axillae bilaterally.  Gait abnormality of gait with antalgic MSK: Diffuse tender with full range of motion and good stability and symmetric strength and tone of shoulders, elbows, wrist, hip, knee and ankles bilaterally.   Back exam shows loss of lordosis.  Poor core strength.  Loss of range of motion of 5 to 10 degrees in all planes.  Neurovascularly intact distally with mild positive straight leg at 30 degrees of forward flexion.  Unable to do [place] secondary to patient's tightness as well as difficulty.   Impression and Recommendations:     This case required medical decision making of moderate complexity. The above documentation has been reviewed and is accurate and complete Lyndal Pulley, DO       Note: This dictation was prepared with Dragon dictation along with smaller phrase technology. Any transcriptional errors that result from this process are unintentional.

## 2018-06-25 NOTE — Telephone Encounter (Signed)
Refill done.  

## 2018-06-26 ENCOUNTER — Ambulatory Visit (INDEPENDENT_AMBULATORY_CARE_PROVIDER_SITE_OTHER): Payer: Managed Care, Other (non HMO) | Admitting: Internal Medicine

## 2018-06-26 ENCOUNTER — Ambulatory Visit (INDEPENDENT_AMBULATORY_CARE_PROVIDER_SITE_OTHER): Payer: Managed Care, Other (non HMO) | Admitting: Family Medicine

## 2018-06-26 ENCOUNTER — Encounter: Payer: Self-pay | Admitting: Family Medicine

## 2018-06-26 ENCOUNTER — Encounter

## 2018-06-26 ENCOUNTER — Encounter: Payer: Self-pay | Admitting: Internal Medicine

## 2018-06-26 VITALS — BP 124/80 | HR 80 | Temp 98.2°F | Ht 64.0 in | Wt 251.0 lb

## 2018-06-26 DIAGNOSIS — M5417 Radiculopathy, lumbosacral region: Secondary | ICD-10-CM | POA: Diagnosis not present

## 2018-06-26 DIAGNOSIS — N183 Chronic kidney disease, stage 3 unspecified: Secondary | ICD-10-CM | POA: Insufficient documentation

## 2018-06-26 DIAGNOSIS — J019 Acute sinusitis, unspecified: Secondary | ICD-10-CM | POA: Diagnosis not present

## 2018-06-26 DIAGNOSIS — R062 Wheezing: Secondary | ICD-10-CM | POA: Diagnosis not present

## 2018-06-26 DIAGNOSIS — R21 Rash and other nonspecific skin eruption: Secondary | ICD-10-CM

## 2018-06-26 DIAGNOSIS — I5022 Chronic systolic (congestive) heart failure: Secondary | ICD-10-CM

## 2018-06-26 MED ORDER — KETOCONAZOLE 2 % EX CREA: 1.0000 "application " | TOPICAL_CREAM | Freq: Every day | CUTANEOUS | 0 refills | Status: DC

## 2018-06-26 MED ORDER — HYDROCOD POLST-CPM POLST ER 10-8 MG/5ML PO SUER: 5.0000 mL | Freq: Two times a day (BID) | ORAL | 0 refills | Status: DC | PRN

## 2018-06-26 MED ORDER — AZITHROMYCIN 250 MG PO TABS: ORAL_TABLET | ORAL | 1 refills | Status: DC

## 2018-06-26 MED ORDER — PREDNISONE 10 MG PO TABS: ORAL_TABLET | ORAL | 0 refills | Status: DC

## 2018-06-26 MED ORDER — METHYLPREDNISOLONE ACETATE 80 MG/ML IJ SUSP
80.0000 mg | Freq: Once | INTRAMUSCULAR | Status: AC
  Administered 2018-06-26: 80 mg via INTRAMUSCULAR

## 2018-06-26 MED ORDER — DULOXETINE HCL 20 MG PO CPEP: 20.0000 mg | ORAL_CAPSULE | Freq: Every day | ORAL | 1 refills | Status: DC

## 2018-06-26 NOTE — Patient Instructions (Addendum)
Good to see you  I am sorry you are in such pain  Cymbalta 20 mg daily  Iron (ferrous sulfate) 65 mg daily or if constipation then do 3 times a week  Vitamin C 500mg  daily  Vitamin D 2000 IU daily  See me again in 4 weeks

## 2018-06-26 NOTE — Assessment & Plan Note (Addendum)
Mild to mod, for depomedrol IM 80, predpac asd,  to f/u any worsening symptoms or concerns  Note:  Total time for pt hx, exam, review of record with pt in the room, determination of diagnoses and plan for further eval and tx is > 40 min, with over 50% spent in coordination and counseling of patient including the differential dx, tx, further evaluation and other management of wheezing, CKD, acute sinus infection, rash, CHF

## 2018-06-26 NOTE — Assessment & Plan Note (Signed)
liekly fungal - for ketocon cream asd

## 2018-06-26 NOTE — Progress Notes (Signed)
Subjective:    Patient ID: Suzanne Hansen, female    DOB: [birth year], 62 y.o.   MRN: [id number]  HPI   Here to f/u, just back from a conference;  Here with 2-3 days acute onset fever, facial pain, bilat ear pressure, headache, general weakness and malaise, and greenish d/c, with mild ST and cough, but pt denies chest pain, wheezing, increased sob or doe, orthopnea, PND, palpitations, dizziness or syncope, except for onset mild wheezing last PM  Had an episode of CP at [facility] had echo with reported EF 65% (on careeverywhere had evidence from aug 8 echo with diastolic dysfxn, BUT BNP was normal at 40).   but pt still c/o LE edema, also noted cr 1.74 recently there as well, has been referred to a renal specialist and pt found out this was a NP instead of MD, really wants to see an MD (oct 31)  Pt now mentions she did have an MRI with dye about June, which in retrospect by lab charting is about the same time the kidneys became CKD, last GFR now 31 Also with left > right red itchy rash to axillas, not better with recent bactrim, but then later tried diflucan that seemed to help somewhat but now run out.  Never had vaginal yeast infection.   Past Medical History:  Diagnosis Date  . ALLERGIC RHINITIS 05/09/2008  . Allergy   . ANXIETY 05/23/2007  . ARM PAIN, LEFT 10/23/2009  . Asthma   . ASTHMA 05/23/2007  . Cardiomyopathy ([place]) 09/08/2015  . CHF (congestive heart failure) ([place])   . CONTACT DERMATITIS 06/09/2009  . Diabetes mellitus   . ENDOMETRIOSIS NOS 05/23/2007  . FACIAL PAIN 06/02/2010  . FREQUENCY, URINARY 05/17/2010  . GLUCOSE INTOLERANCE 08/28/2009  . HERPES SIMPLEX, UNCOMPLICATED 1/44/8185  . Hyperlipidemia 08/28/2015  . HYPERTHYROIDISM 05/23/2007  . Hypoactive thyroid   . HYPOTHYROIDISM 05/09/2008  . Impaired glucose tolerance 01/28/2011  . INSOMNIA, HX OF 05/23/2007  . LATERAL EPICONDYLITIS, RIGHT 03/10/2009  . LIPOMA 10/22/2010  . NECK PAIN 11/10/2010  . OBESITY 05/23/2007  . Plantar fasciitis    Both feet  . SHOULDER PAIN, LEFT 05/17/2010  . SINUSITIS- ACUTE-NOS 11/03/2008  . SKIN LESION 11/10/2010  . Sleep apnea   . Unspecified Chest Pain 07/25/2008  . UNSPECIFIED URTICARIA 06/04/2009  . URI 08/28/2009  . UTI 04/29/2008  . VITAMIN D DEFICIENCY 08/28/2009  . Wheezing 07/12/2010   Past Surgical History:  Procedure Laterality Date  . COLONOSCOPY    . ENDOMETRIAL ABLATION    . LAMINECTOMY  1996  . LAPAROTOMY     exploratory  . POLYPECTOMY    . s/p endometrial ablation  12/06  . TUBAL LIGATION    . UTERINE FIBROID SURGERY    . WISDOM TOOTH EXTRACTION      reports that she quit smoking about 30 years ago. Her smoking use included cigarettes. She smoked 0.50 packs per day. She has never used smokeless tobacco. She reports that she does not drink alcohol or use drugs. family history includes COPD in her father; Cancer in her maternal grandmother; Diabetes in her cousin and sister; Heart disease in her cousin and mother; Hypertension in her cousin and father; Kidney disease in her sister. Allergies  Allergen Reactions  . Ivp Dye [Iodinated Diagnostic Agents] Shortness Of Breath and Other (See Comments)    Swelling of extremity of the injection site  . Latex Itching    ITCHING AND COLD SORES AROUND MOUTH WHEN LATEX GLOVES USED BY  THE DENTIST/HYGENTIST  . Clarithromycin Nausea And Vomiting    REACTION: nausea  vomiting  . Metronidazole Nausea And Vomiting  . Zostavax [Zoster Vaccine Live]    Current Outpatient Medications on File Prior to Visit  Medication Sig Dispense Refill  . albuterol (PROVENTIL HFA;VENTOLIN HFA) 108 (90 Base) MCG/ACT inhaler Inhale 2 puffs into the lungs every 4 (four) hours as needed for wheezing or shortness of breath. 1 Inhaler 3  . aspirin 81 MG EC tablet Take 81 mg by mouth daily.      . budesonide-formoterol (SYMBICORT) 160-4.5 MCG/ACT inhaler INHALE 2 PUFFS INTO THE LUNGS TWICE DAILY 10.2 g 6  . Cholecalciferol (VITAMIN D) 2000 UNITS CAPS Take 2 capsules  by mouth daily.    . fexofenadine (ALLEGRA) 180 MG tablet Take 180 mg by mouth daily.      . fluconazole (DIFLUCAN) 150 MG tablet 1 tab by mouth every 3 days as needed 2 tablet 1  . fluticasone (FLONASE) 50 MCG/ACT nasal spray Place 2 sprays into both nostrils daily. 16 g 6  . gabapentin (NEURONTIN) 300 MG capsule TAKE 1 CAPSULE BY MOUTH THREE TIMES A DAY 90 capsule 1  . KLOR-CON M20 20 MEQ tablet TAKE 2 TABLETS (40 MEQ TOTAL) BY MOUTH DAILY  3  . levothyroxine (SYNTHROID, LEVOTHROID) 112 MCG tablet TAKE 1 TABLET BY MOUTH EVERY DAY 90 tablet 0  . losartan (COZAAR) 50 MG tablet TAKE 1 TABLET BY MOUTH EVERY DAY 30 tablet 0  . Magnesium Oxide 500 MG TABS Take by mouth.    . Multiple Vitamin (MULTIVITAMIN) capsule Take 1 capsule by mouth daily.      [name] nystatin-triamcinolone (MYCOLOG II) cream Apply 1 application topically 2 (two) times daily. 60 g 0  . omeprazole (PRILOSEC) 20 MG capsule Take 1 capsule (20 mg total) by mouth daily. 90 capsule 3  . potassium chloride (KLOR-CON) 8 MEQ tablet Take 8 mEq daily by mouth.    . rosuvastatin (CRESTOR) 10 MG tablet TAKE 1 TABLET BY MOUTH EVERY DAY 90 tablet 3  . torsemide (DEMADEX) 20 MG tablet Take 60mg  in the am and 40mg  in the pm.    . traMADol (ULTRAM) 50 MG tablet Take 1 tablet (50 mg total) by mouth every 8 (eight) hours as needed. 30 tablet 0  . valACYclovir (VALTREX) 1000 MG tablet Take 500 mg by mouth daily.  0  . Vitamin D, Ergocalciferol, (DRISDOL) 50000 units CAPS capsule TAKE 1 CAPSULE (50,000 UNITS TOTAL) EVERY 7 (SEVEN) DAYS BY MOUTH. 12 capsule 0   No current facility-administered medications on file prior to visit.    Review of Systems  Constitutional: Negative for other unusual diaphoresis or sweats HENT: Negative for ear discharge or swelling Eyes: Negative for other worsening visual disturbances Respiratory: Negative for stridor or other swelling  Gastrointestinal: Negative for worsening distension or other blood Genitourinary:  Negative for retention or other urinary change Musculoskeletal: Negative for other MSK pain or swelling Skin: Negative for color change or other new lesions Neurological: Negative for worsening tremors and other numbness  Psychiatric/Behavioral: Negative for worsening agitation or other fatigue All other system neg per pt    Objective:   Physical Exam BP 124/80   Pulse 80   Temp 98.2 F (36.8 C) (Oral)   Ht 5\' 4"  (1.626 m)   Wt 251 lb (113.9 kg)   SpO2 96%   BMI 43.08 kg/m  VS noted, mild ill Constitutional: Pt appears in NAD HENT: Head: NCAT.  Right Ear: External  ear normal.  Left Ear: External ear normal.  Bilat tm's with mild erythema.  Max sinus areas milf tender.  Pharynx with mild erythema, no exudate Eyes: . Pupils are equal, round, and reactive to light. Conjunctivae and EOM are normal Nose: without d/c or deformity Neck: Neck supple. Gross normal ROM Cardiovascular: Normal rate and regular rhythm.   Pulmonary/Chest: Effort normal and breath sounds decreased without rales but with few bilat wheezing.  Abd:  Soft, NT, ND, + BS, no organomegaly Neurological: Pt is alert. At baseline orientation, motor grossly intact Skin: Skin is warm. + nontender erythem rashes to axilla, no other new lesions, 1+ bilat LE edema left > right Psychiatric: Pt behavior is normal without agitation , mild nervous No other exam findings Lab Results  Component Value Date   WBC 6.8 05/16/2018   HGB 12.2 05/16/2018   HCT 36.3 05/16/2018   PLT 276.0 05/16/2018   GLUCOSE 106 (H) 05/16/2018   CHOL 144 05/16/2018   TRIG 66.0 05/16/2018   HDL 81.10 05/16/2018   LDLCALC 50 05/16/2018   ALT 19 05/16/2018   AST 14 05/16/2018   NA 140 05/16/2018   K 4.1 05/16/2018   CL 102 05/16/2018   CREATININE 1.99 (H) 05/16/2018   BUN 31 (H) 05/16/2018   CO2 30 05/16/2018   TSH 0.41 05/16/2018   INR 1.0 03/21/2017   HGBA1C 5.7 (A) 05/04/2018   MICROALBUR <0.7 05/16/2018      Assessment & Plan:

## 2018-06-26 NOTE — Assessment & Plan Note (Signed)
Mild to mod, for antibx course,  to f/u any worsening symptoms or concerns 

## 2018-06-26 NOTE — Assessment & Plan Note (Signed)
Most recent EF 60-65%, has some diast dysfxn, BNP normal so not likely to be related to the edema? , now on high dose demadex though

## 2018-06-26 NOTE — Patient Instructions (Addendum)
Ok to stop the [employer] had the steroid shot today  Please take all new medication as prescribed - the antibiotic, cough medicine as needed, and prednisone, and antifungal cream  Please continue all other medications as before, and refills have been done if requested.  Please have the pharmacy call with any other refills you may need.  Please keep your appointments with your specialists as you may have planned  You will be contacted regarding the referral for: Nephrology (kidneys)

## 2018-06-26 NOTE — Assessment & Plan Note (Addendum)
Very recent onset and rapidly worsenig I think possibly related to MRI with dye per NS done June 2019 per pt; will refer to local nephrology as pt is just not sure about seeing NP at [facility] on Oct 31; also d/c mobic, for tramadol prn pain

## 2018-06-27 NOTE — Assessment & Plan Note (Signed)
Patient has had radiculopathy.  Significant tightness still noted.  Positive straight leg test noted.  Patient is on different medications but is concerned with patient's kidneys.  On gabapentin 300 mg.  Do not want to do the prednisone on a regular basis.  Started Cymbalta 20 mg discussed the possibility of formal physical therapy and icing regimen.  Follow-up with me again in 4 weeks  Spent  25 minutes with patient face-to-face and had greater than 50% of counseling including as described above in assessment and plan.

## 2018-07-16 ENCOUNTER — Telehealth: Payer: Self-pay

## 2018-07-16 NOTE — Telephone Encounter (Signed)
Since her kidney slowing does not cause symptoms, I cannot authorize a Sick Note to allow her a refund.  The exception would be if her renal referral date to be seen was on or about the dates of the trip    Thanks

## 2018-07-16 NOTE — Telephone Encounter (Signed)
Copied from [place] [phone]. Topic: Quick Communication - See Telephone Encounter >> Jul 16, 2018  2:49 PM Hewitt Shorts wrote: Pt is wanting to get a note from Dr. Jenny Reichmann in order for there to get her money back on a trip to [facility] that she feels she should not go on  Since she is having kidney issues and they stated that if a medical note about her condition was written then she would be able to get the refund  Best number [phone]

## 2018-07-17 NOTE — Telephone Encounter (Signed)
Called pt, LVM.   

## 2018-07-23 ENCOUNTER — Other Ambulatory Visit: Payer: Self-pay | Admitting: Internal Medicine

## 2018-07-24 ENCOUNTER — Encounter (INDEPENDENT_AMBULATORY_CARE_PROVIDER_SITE_OTHER): Payer: Self-pay

## 2018-07-24 ENCOUNTER — Ambulatory Visit: Payer: Managed Care, Other (non HMO) | Admitting: Family Medicine

## 2018-08-09 ENCOUNTER — Ambulatory Visit (INDEPENDENT_AMBULATORY_CARE_PROVIDER_SITE_OTHER): Payer: Managed Care, Other (non HMO) | Admitting: Bariatrics

## 2018-08-09 ENCOUNTER — Encounter (INDEPENDENT_AMBULATORY_CARE_PROVIDER_SITE_OTHER): Payer: Self-pay | Admitting: Bariatrics

## 2018-08-09 VITALS — BP 105/72 | HR 79 | Temp 97.6°F | Ht 64.0 in | Wt 241.0 lb

## 2018-08-09 DIAGNOSIS — G4733 Obstructive sleep apnea (adult) (pediatric): Secondary | ICD-10-CM

## 2018-08-09 DIAGNOSIS — R0602 Shortness of breath: Secondary | ICD-10-CM | POA: Diagnosis not present

## 2018-08-09 DIAGNOSIS — Z9189 Other specified personal risk factors, not elsewhere classified: Secondary | ICD-10-CM | POA: Diagnosis not present

## 2018-08-09 DIAGNOSIS — Z1331 Encounter for screening for depression: Secondary | ICD-10-CM

## 2018-08-09 DIAGNOSIS — N183 Chronic kidney disease, stage 3 unspecified: Secondary | ICD-10-CM

## 2018-08-09 DIAGNOSIS — E559 Vitamin D deficiency, unspecified: Secondary | ICD-10-CM

## 2018-08-09 DIAGNOSIS — R5383 Other fatigue: Secondary | ICD-10-CM

## 2018-08-09 DIAGNOSIS — E038 Other specified hypothyroidism: Secondary | ICD-10-CM

## 2018-08-09 DIAGNOSIS — Z6841 Body Mass Index (BMI) 40.0 and over, adult: Secondary | ICD-10-CM

## 2018-08-09 DIAGNOSIS — R7303 Prediabetes: Secondary | ICD-10-CM

## 2018-08-09 DIAGNOSIS — Z0289 Encounter for other administrative examinations: Secondary | ICD-10-CM

## 2018-08-10 LAB — INSULIN, RANDOM: INSULIN: 17.9 u[IU]/mL (ref 2.6–24.9)

## 2018-08-10 LAB — HEMOGLOBIN A1C
ESTIMATED AVERAGE GLUCOSE: 114 mg/dL
HEMOGLOBIN A1C: 5.6 % (ref 4.8–5.6)

## 2018-08-10 LAB — T4, FREE: FREE T4: 1.67 ng/dL (ref 0.82–1.77)

## 2018-08-10 LAB — TSH: TSH: 0.132 u[IU]/mL — ABNORMAL LOW (ref 0.450–4.500)

## 2018-08-10 LAB — T3: T3 TOTAL: 116 ng/dL (ref 71–180)

## 2018-08-13 ENCOUNTER — Encounter: Payer: Self-pay | Admitting: Internal Medicine

## 2018-08-13 ENCOUNTER — Telehealth: Payer: Self-pay | Admitting: Internal Medicine

## 2018-08-13 ENCOUNTER — Encounter (INDEPENDENT_AMBULATORY_CARE_PROVIDER_SITE_OTHER): Payer: Self-pay | Admitting: Bariatrics

## 2018-08-13 DIAGNOSIS — Z9989 Dependence on other enabling machines and devices: Secondary | ICD-10-CM | POA: Insufficient documentation

## 2018-08-13 DIAGNOSIS — G4733 Obstructive sleep apnea (adult) (pediatric): Secondary | ICD-10-CM | POA: Insufficient documentation

## 2018-08-13 DIAGNOSIS — R7303 Prediabetes: Secondary | ICD-10-CM | POA: Insufficient documentation

## 2018-08-13 DIAGNOSIS — E039 Hypothyroidism, unspecified: Secondary | ICD-10-CM

## 2018-08-13 MED ORDER — LEVOTHYROXINE SODIUM 100 MCG PO TABS: 100.0000 ug | ORAL_TABLET | Freq: Every day | ORAL | 3 refills | Status: DC

## 2018-08-13 NOTE — Progress Notes (Signed)
.  Office: [phone]  /  Fax: [phone]   HPI:   Chief Complaint: OBESITY  Suzanne Hansen (MR# [id number]) is a 62 y.o. female who presents on 08/13/2018 for obesity evaluation and treatment. Current BMI is Body mass index is 41.37 kg/m.Suzanne Hansen Suzanne Hansen has struggled with obesity for years and has been unsuccessful in either losing weight or maintaining long term weight loss. Suzanne Hansen attended our information session and states she is currently in the action stage of change and ready to dedicate time achieving and maintaining a healthier weight.  Suzanne Hansen states her family eats meals together she thinks her family will eat healthier with  her her desired weight loss is 69 to 94 she started gaining weight in 1995 her heaviest weight ever was 248 lbs. she has significant food cravings issues  she snacks frequently in the evenings she is frequently drinking liquids with calories she struggles with emotional eating    Fatigue Suzanne Hansen feels her energy is lower than it should be. This has worsened with weight gain and has not worsened recently. Suzanne Hansen admits to daytime somnolence and admits to waking up still tired. Patient has a history of obstructive sleep apnea, which may contribute to her fatigue. Suzanne Hansen has a history of asthma and she rarely uses a rescue inhaler. Patent has a history of symptoms of daytime fatigue and morning fatigue. Patient generally gets 5 hours of sleep per night, and states they generally have restless sleep. Snoring is present. Apneic episodes are present. Her asthma is currently controlled. Epworth Sleepiness Score is 9  Dyspnea on exertion Suzanne Hansen notes increasing shortness of breath with exercising and seems to be worsening over time with weight gain. She notes getting out of breath sooner with activity than she used to. This has not gotten worse recently. Suzanne Hansen denies orthopnea.  Sleep Apnea Suzanne Hansen has a history of sleep apnea and she uses an oral  appliance. Suzanne Hansen sees a neurologist.   Hypothyroidism Suzanne Hansen has a diagnosis of hypothyroidism. She is taking levothyroxine. She has some sensitivity to hot or cold intolerance and she admits to ongoing fatigue.  Chronic Kidney Disease Stage 3, GFR 45 (07/12/18) Suzanne Hansen has a history of CKD stage 3. She is seeing a nephrologist. Suzanne Hansen had a recent UA with microscopic exam.  Vitamin D deficiency Suzanne Hansen has a diagnosis of vitamin D deficiency. Her last vitamin D level was at 55. She is currently taking OTC vit D and denies nausea, vomiting or muscle weakness.  Pre-Diabetes Suzanne Hansen has a diagnosis of prediabetes based on her elevated Hgb A1c and was informed this puts her at greater risk of developing diabetes. Suzanne Hansen states that in the past she had been on metformin, but she stopped when her A1c went down. She is attempting to work on diet and exercise to decrease risk of diabetes. She denies nausea or hypoglycemia.  At risk for diabetes Suzanne Hansen is at higher than average risk for developing diabetes due to her obesity and prediabetes. She currently denies polyuria or polydipsia.  Depression Screen Suzanne Hansen's Food and Mood (modified PHQ-9) score was  Depression screen PHQ 2/9 08/09/2018  Decreased Interest 2  Down, Depressed, Hopeless 1  PHQ - 2 Score 3  Altered sleeping 2  Tired, decreased energy 3  Change in appetite 1  Feeling bad or failure about yourself  1  Trouble concentrating 0  Moving slowly or fidgety/restless 2  Suicidal thoughts 0  PHQ-9 Score 12  Difficult doing work/chores Somewhat difficult    ALLERGIES: Allergies  Allergen  Reactions  . Ivp Dye [Iodinated Diagnostic Agents] Shortness Of Breath and Other (See Comments)    Swelling of extremity of the injection site  . Latex Itching    ITCHING AND COLD SORES AROUND MOUTH WHEN LATEX GLOVES USED BY THE DENTIST/HYGENTIST  . Clarithromycin Nausea And Vomiting    REACTION: nausea  vomiting  . Iodine   .  Lisinopril   . Metronidazole Nausea And Vomiting  . Shellfish Allergy   . Zostavax [Zoster Vaccine Live]     MEDICATIONS: Current Outpatient Medications on File Prior to Visit  Medication Sig Dispense Refill  . albuterol (PROVENTIL HFA;VENTOLIN HFA) 108 (90 Base) MCG/ACT inhaler Inhale 2 puffs into the lungs every 4 (four) hours as needed for wheezing or shortness of breath. 1 Inhaler 3  . aspirin 81 MG EC tablet Take 81 mg by mouth daily.      . budesonide-formoterol (SYMBICORT) 160-4.5 MCG/ACT inhaler INHALE 2 PUFFS INTO THE LUNGS TWICE DAILY 10.2 g 6  . Cholecalciferol (VITAMIN D) 2000 UNITS CAPS Take 2 capsules by mouth daily.    . fexofenadine (ALLEGRA) 180 MG tablet Take 180 mg by mouth daily.      . fluticasone (FLONASE) 50 MCG/ACT nasal spray Place 2 sprays into both nostrils daily. 16 g 6  . gabapentin (NEURONTIN) 300 MG capsule TAKE 1 CAPSULE BY MOUTH THREE TIMES A DAY 90 capsule 1  . ketoconazole (NIZORAL) 2 % cream Apply 1 application topically daily. 15 g 0  . KLOR-CON M20 20 MEQ tablet TAKE 2 TABLETS (40 MEQ TOTAL) BY MOUTH DAILY  3  . levothyroxine (SYNTHROID, LEVOTHROID) 112 MCG tablet TAKE 1 TABLET BY MOUTH EVERY DAY 90 tablet 0  . losartan (COZAAR) 50 MG tablet TAKE 1 TABLET BY MOUTH EVERY DAY 30 tablet 0  . Magnesium Oxide 500 MG TABS Take by mouth.    . Multiple Vitamin (MULTIVITAMIN) capsule Take 1 capsule by mouth daily.      Suzanne Hansen nystatin-triamcinolone (MYCOLOG II) cream Apply 1 application topically 2 (two) times daily. 60 g 0  . omeprazole (PRILOSEC) 20 MG capsule Take 1 capsule (20 mg total) by mouth daily. 90 capsule 3  . potassium chloride (KLOR-CON) 8 MEQ tablet Take 8 mEq daily by mouth.    . rosuvastatin (CRESTOR) 10 MG tablet TAKE ONE TABLET BY MOUTH DAILY 180 tablet 1  . torsemide (DEMADEX) 20 MG tablet 60 mg in the morning and evening time.    . valACYclovir (VALTREX) 1000 MG tablet Take 500 mg by mouth daily.  0  . Vitamin D, Ergocalciferol, (DRISDOL) 50000  units CAPS capsule TAKE 1 CAPSULE (50,000 UNITS TOTAL) EVERY 7 (SEVEN) DAYS BY MOUTH. 12 capsule 0   No current facility-administered medications on file prior to visit.     PAST MEDICAL HISTORY: Past Medical History:  Diagnosis Date  . ALLERGIC RHINITIS 05/09/2008  . Allergy   . ANXIETY 05/23/2007  . ARM PAIN, LEFT 10/23/2009  . Arthritis   . Asthma   . ASTHMA 05/23/2007  . Back pain   . Cardiomyopathy ([place]) 09/08/2015  . Chest pain   . CHF (congestive heart failure) ([place])   . Constipation   . CONTACT DERMATITIS 06/09/2009  . Diabetes mellitus   . ENDOMETRIOSIS NOS 05/23/2007  . FACIAL PAIN 06/02/2010  . Food allergy   . FREQUENCY, URINARY 05/17/2010  . GLUCOSE INTOLERANCE 08/28/2009  . HERPES SIMPLEX, UNCOMPLICATED 04/28/9469  . Hyperlipidemia 08/28/2015  . HYPERTHYROIDISM 05/23/2007  . Hypoactive thyroid   . HYPOTHYROIDISM  05/09/2008  . Impaired glucose tolerance 01/28/2011  . INSOMNIA, HX OF 05/23/2007  . Joint pain   . LATERAL EPICONDYLITIS, RIGHT 03/10/2009  . LIPOMA 10/22/2010  . NECK PAIN 11/10/2010  . OBESITY 05/23/2007  . Plantar fasciitis    Both feet  . SHOULDER PAIN, LEFT 05/17/2010  . SINUSITIS- ACUTE-NOS 11/03/2008  . SKIN LESION 11/10/2010  . Sleep apnea   . Stage III chronic kidney disease ([place])   . Unspecified Chest Pain 07/25/2008  . UNSPECIFIED URTICARIA 06/04/2009  . URI 08/28/2009  . UTI 04/29/2008  . VITAMIN D DEFICIENCY 08/28/2009  . Wheezing 07/12/2010    PAST SURGICAL HISTORY: Past Surgical History:  Procedure Laterality Date  . COLONOSCOPY    . ENDOMETRIAL ABLATION    . LAMINECTOMY  1996  . LAPAROTOMY     exploratory  . lower back surgery    . POLYPECTOMY    . s/p endometrial ablation  12/06  . TUBAL LIGATION    . UTERINE FIBROID SURGERY    . WISDOM TOOTH EXTRACTION      SOCIAL HISTORY: Social History   Tobacco Use  . Smoking status: Former Smoker    Packs/day: 0.50    Types: Cigarettes    Last attempt to quit: 11/05/1987    Years since  quitting: 30.7  . Smokeless tobacco: Never Used  Substance Use Topics  . Alcohol use: No    Alcohol/week: 1.0 standard drinks    Types: 1 Glasses of wine per week    Comment: WINE  . Drug use: No    FAMILY HISTORY: Family History  Problem Relation Age of Onset  . Diabetes Cousin   . Hypertension Cousin   . Heart disease Cousin   . Heart disease Mother        Rheumatic heart disease  . Depression Mother   . COPD Father   . Hypertension Father   . Cancer Maternal Grandmother        Breast  . Diabetes Sister   . Kidney disease Sister     ROS: Review of Systems  Constitutional: Positive for malaise/fatigue.  HENT: Positive for tinnitus.   Eyes:       + Wear Glasses or Contacts  Respiratory: Positive for shortness of breath (with activity).   Cardiovascular: Negative for orthopnea.  Gastrointestinal: Negative for nausea and vomiting.  Genitourinary: Negative for frequency.  Musculoskeletal: Positive for back pain.       + Muscle Stiffness + Neck Lumps Negative for muscle weakness  Skin: Positive for itching.       + Dryness   Neurological: Positive for weakness.  Endo/Heme/Allergies: Negative for polydipsia. Bruises/bleeds easily (bruising).       + Hot or Cold intolerance Negative for hypoglycemia  Psychiatric/Behavioral: The patient has insomnia.     PHYSICAL EXAM: Blood pressure 105/72, pulse 79, temperature 97.6 F (36.4 C), temperature source Oral, height 5\' 4"  (1.626 m), weight 241 lb (109.3 kg), SpO2 99 %. Body mass index is 41.37 kg/m. Physical Exam  Constitutional: She is oriented to person, place, and time. She appears well-developed and well-nourished.  HENT:  Head: Normocephalic and atraumatic.  Nose: Nose normal.  [place]pati = 3/4  Eyes: EOM are normal. No scleral icterus.  Neck: Normal range of motion. Neck supple. No thyromegaly present.  Cardiovascular: Normal rate and regular rhythm.  Pulmonary/Chest: Effort normal. No respiratory distress.   Abdominal: Soft. There is no tenderness.  + Obesity  Musculoskeletal: Normal range of motion. She exhibits edema (1+  edema bilateral lower extremities).  Range of Motion normal in all 4 extremities   Neurological: She is alert and oriented to person, place, and time. Coordination normal.  Skin: Skin is warm and dry.  Psychiatric: She has a normal mood and affect.  Vitals reviewed.   RECENT LABS AND TESTS: BMET    Component Value Date/Time   NA 140 05/16/2018 0924   K 4.1 05/16/2018 0924   CL 102 05/16/2018 0924   CO2 30 05/16/2018 0924   GLUCOSE 106 (H) 05/16/2018 0924   BUN 31 (H) 05/16/2018 0924   CREATININE 1.99 (H) 05/16/2018 0924   CREATININE 0.97 09/17/2015 1113   CALCIUM 10.0 05/16/2018 0924   GFRNONAA 48 (L) 11/18/2016 1554   GFRAA 55 (L) 11/18/2016 1554   Lab Results  Component Value Date   HGBA1C 5.6 08/09/2018   Lab Results  Component Value Date   INSULIN 17.9 08/09/2018   CBC    Component Value Date/Time   WBC 6.8 05/16/2018 0924   RBC 3.94 05/16/2018 0924   HGB 12.2 05/16/2018 0924   HCT 36.3 05/16/2018 0924   PLT 276.0 05/16/2018 0924   MCV 92.3 05/16/2018 0924   MCH 31.4 11/18/2016 1554   MCHC 33.5 05/16/2018 0924   RDW 13.3 05/16/2018 0924   LYMPHSABS 2.5 05/16/2018 0924   MONOABS 0.6 05/16/2018 0924   EOSABS 0.1 05/16/2018 0924   BASOSABS 0.1 05/16/2018 0924   Iron/TIBC/Ferritin/ %Sat    Component Value Date/Time   IRON 55 03/21/2017 0935   Lipid Panel     Component Value Date/Time   CHOL 144 05/16/2018 0924   TRIG 66.0 05/16/2018 0924   HDL 81.10 05/16/2018 0924   CHOLHDL 2 05/16/2018 0924   VLDL 13.2 05/16/2018 0924   LDLCALC 50 05/16/2018 0924   Hepatic Function Panel     Component Value Date/Time   PROT 7.1 05/16/2018 0924   ALBUMIN 4.1 05/16/2018 0924   AST 14 05/16/2018 0924   ALT 19 05/16/2018 0924   ALKPHOS 72 05/16/2018 0924   BILITOT 0.5 05/16/2018 0924   BILIDIR 0.1 05/16/2018 0924      Component Value  Date/Time   TSH 0.132 (L) 08/09/2018 1003    ECG  shows NSR with a rate of 74 BPM INDIRECT CALORIMETER done today shows a VO2 of 265 and a REE of 1848. Her calculated basal metabolic rate is 9485 thus her basal metabolic rate is better than expected.    ASSESSMENT AND PLAN: Other fatigue - Plan: EKG 12-Lead  Shortness of breath on exertion  Obstructive sleep apnea syndrome  Other specified hypothyroidism - Plan: T3, T4, free, TSH  Stage 3 chronic kidney disease (HCC)  Vitamin D deficiency  Prediabetes - Plan: Hemoglobin A1c, Insulin, random  Depression screening  At risk for diabetes mellitus  Class 3 severe obesity with serious comorbidity and body mass index (BMI) of 40.0 to 44.9 in adult, unspecified obesity type ([place])  PLAN:  Fatigue Kalley was informed that her fatigue may be related to obesity, depression or many other causes. Labs will be ordered, and in the meanwhile Kalianna has agreed to work on diet, exercise and weight loss to help with fatigue. Proper sleep hygiene was discussed including the need for 7-8 hours of quality sleep each night. A sleep study was not ordered based on symptoms and Epworth score.  Dyspnea on exertion Terris's shortness of breath appears to be obesity related and exercise induced. She has agreed to work on weight loss and  continue exercise to treat her exercise induced shortness of breath. Emili was encouraged to use her inhaler if she increases exercise. If Jurline follows our instructions and loses weight without improvement of her shortness of breath, we will plan to refer to pulmonology. We will monitor this condition regularly. Llana agrees to this plan.  Sleep Apnea Clatie will continue to wear her oral appliance and follow up with her neurologist. She agrees to follow up with our clinic in 2 weeks.  Hypothyroidism Jenisa was informed of the importance of good thyroid control to help with weight loss efforts. She  was also informed that supertheraputic thyroid levels are dangerous and will not improve weight loss results. We will check thyroid levels today and Lakynn will follow up at the agreed upon time.  Chronic Kidney Disease Stage 3, GFR 45 (07/12/18) Tomia will follow up with her nephrologist. She will follow up with our clinic in 2 weeks.  Vitamin D Deficiency Thia was informed that low vitamin D levels contributes to fatigue and are associated with obesity, breast, and colon cancer. She agrees to continue to take OTC Vit D and will follow up for routine testing of vitamin D, at least 2-3 times per year. She was informed of the risk of over-replacement of vitamin D and agrees to not increase her dose unless she discusses this with [place] first.  Pre-Diabetes Leinaala will continue to work on weight loss, exercise, and decreasing simple carbohydrates in her diet to help decrease the risk of diabetes. She was informed that eating too many simple carbohydrates or too many calories at one sitting increases the likelihood of GI side effects. We will check insulin level and Hgb A1c today. Janiaya agreed to follow up with [place] as directed to monitor her progress.  Diabetes risk counseling Antionette was given extended (15 minutes) diabetes prevention counseling today. She is 62 y.o. female and has risk factors for diabetes including obesity and prediabetes. We discussed intensive lifestyle modifications today with an emphasis on weight loss as well as increasing exercise and decreasing simple carbohydrates in her diet.  Depression Screen Reana had a moderately positive depression screening. Depression is commonly associated with obesity and often results in emotional eating behaviors. We will monitor this closely and work on CBT to help improve the non-hunger eating patterns. Referral to Psychology may be required if no improvement is seen as she continues in our clinic.  Obesity Virginia is currently in  the action stage of change and her goal is to continue with weight loss efforts She has agreed to follow the Category 3 plan Jamecia has been instructed to work up to a goal of 150 minutes of combined cardio and strengthening exercise per week for weight loss and overall health benefits. We discussed the following Behavioral Modification Strategies today: more meal planning, no skipping meals, increase H2O intake, increasing lean protein intake, decreasing simple carbohydrates , increasing vegetables and decrease eating out  Mayanna has agreed to follow up with our clinic in 2 weeks. She was informed of the importance of frequent follow up visits to maximize her success with intensive lifestyle modifications for her multiple health conditions. She was informed we would discuss her lab results at her next visit unless there is a critical issue that needs to be addressed sooner. Trenee agreed to keep her next visit at the agreed upon time to discuss these results.    OBESITY BEHAVIORAL INTERVENTION VISIT  Today's visit was # 1   Starting weight: 241 lbs  Starting date: 08/09/18 Today's weight : 241 lbs  Today's date: 08/09/18 Total lbs lost to date: 0  ASK: We discussed the diagnosis of obesity with Mamie Laurel today and Rayshell agreed to give [place] permission to discuss obesity behavioral modification therapy today.  ASSESS: Lamija has the diagnosis of obesity and her BMI today is 41.35 Makinzie is in the action stage of change   ADVISE: Loukisha was educated on the multiple health risks of obesity as well as the benefit of weight loss to improve her health. She was advised of the need for long term treatment and the importance of lifestyle modifications to improve her current health and to decrease her risk of future health problems.  AGREE: Multiple dietary modification options and treatment options were discussed and  Arfa agreed to follow the recommendations documented in  the above note.  ARRANGE: Aeron was educated on the importance of frequent visits to treat obesity as outlined per CMS and USPSTF guidelines and agreed to schedule her next follow up appointment today.   Corey Skains, am acting as Location manager for [employer]. Owens Shark, DO  I have reviewed the above documentation for accuracy and completeness, and I agree with the above. -Jearld Lesch, DO

## 2018-08-13 NOTE — Telephone Encounter (Signed)
Copied from [place] [id number]. Topic: Quick Communication - See Telephone Encounter >> Aug 13, 2018  2:36 PM Sheran Luz wrote: CRM for notification. See Telephone encounter for: 08/13/18.  Pt called regarding MyChart message sent to Dr. Jenny Reichmann today, 11/4. Pt wanted to note that the medication in question was levothyroxine (SYNTHROID, LEVOTHROID) 112 MCG tablet and she would like to know if Dr. Jenny Reichmann recommended adjusting the dosage. Pt states that the labs on 10/31 were fasting and wonders if that had something to do with the results. Please advise.

## 2018-08-14 NOTE — Telephone Encounter (Signed)
Fasting does not affect the results.  See other lab note regarding change of thyroid med dosing, thanks

## 2018-08-23 ENCOUNTER — Encounter (INDEPENDENT_AMBULATORY_CARE_PROVIDER_SITE_OTHER): Payer: Self-pay | Admitting: Bariatrics

## 2018-08-23 ENCOUNTER — Ambulatory Visit (INDEPENDENT_AMBULATORY_CARE_PROVIDER_SITE_OTHER): Payer: Managed Care, Other (non HMO) | Admitting: Bariatrics

## 2018-08-23 VITALS — BP 102/68 | HR 90 | Temp 98.0°F | Ht 64.0 in | Wt 238.0 lb

## 2018-08-23 DIAGNOSIS — F3289 Other specified depressive episodes: Secondary | ICD-10-CM

## 2018-08-23 DIAGNOSIS — E038 Other specified hypothyroidism: Secondary | ICD-10-CM

## 2018-08-23 DIAGNOSIS — Z9189 Other specified personal risk factors, not elsewhere classified: Secondary | ICD-10-CM | POA: Diagnosis not present

## 2018-08-23 DIAGNOSIS — Z6841 Body Mass Index (BMI) 40.0 and over, adult: Secondary | ICD-10-CM

## 2018-08-23 DIAGNOSIS — N183 Chronic kidney disease, stage 3 unspecified: Secondary | ICD-10-CM

## 2018-08-23 DIAGNOSIS — G4733 Obstructive sleep apnea (adult) (pediatric): Secondary | ICD-10-CM

## 2018-08-23 DIAGNOSIS — R7303 Prediabetes: Secondary | ICD-10-CM | POA: Diagnosis not present

## 2018-08-23 MED ORDER — TOPIRAMATE 50 MG PO TABS: 50.0000 mg | ORAL_TABLET | Freq: Every day | ORAL | 0 refills | Status: DC

## 2018-08-26 ENCOUNTER — Other Ambulatory Visit (INDEPENDENT_AMBULATORY_CARE_PROVIDER_SITE_OTHER): Payer: Self-pay | Admitting: Bariatrics

## 2018-08-26 DIAGNOSIS — F3289 Other specified depressive episodes: Secondary | ICD-10-CM

## 2018-08-29 NOTE — Progress Notes (Signed)
Office: [phone]  /  Fax: [phone]   HPI:   Chief Complaint: OBESITY Suzanne Hansen is here to discuss her progress with her obesity treatment plan. She is on the Category 3 plan and is following her eating plan approximately 100 % of the time. She states she is exercising 0 minutes 0 times per week. Suzanne Hansen has been following the plan. She notes that "It is doable", but she wants to persist. She did not feel hungry on the plan. She did have some cravings; sweets and salty.  Her weight is 238 lb (108 kg) today and has had a weight loss of 3 pounds over a period of 2 weeks since her last visit. She has lost 3 lbs since starting treatment with [place].  Pre-Diabetes Suzanne Hansen has a diagnosis of pre-diabetes based on her elevated Hgb A1c and was informed this puts her at greater risk of developing diabetes. Her previous Hgb A1c was 5.7 on 05/04/18. Her Hgb A1c was 5.6 and Insulin was 17.9 on 08/09/18. She is not taking metformin currently and continues to work on diet and exercise to decrease risk of diabetes.  At risk for diabetes Suzanne Hansen is at higher than average risk for developing diabetes due to her pre-diabetes and obesity. She currently denies polyuria or polydipsia.  Hypothyroid Suzanne Hansen has a diagnosis of hypothyroidism. She is on levothyroxine 162mcg now, which it was 128mcg. Her TSH is 0.132 and T3 and T4 were within normal limits.  Sleep Apnea Suzanne Hansen is wearing an oral appliance and is doing well with sleep.  Chronic Kidney Disease, Stage 3, GFR-45 Suzanne Hansen is seeing a nephrologist as she has chronic kidney disease.  Depression with emotional eating behaviors Suzanne Hansen is struggling with emotional eating and using food for comfort to the extent that it is negatively impacting her health. She often snacks when she is not hungry. Suzanne Hansen sometimes feels she is out of control and then feels guilty that she made poor food choices. She has been working on behavior modification  techniques to help reduce her emotional eating and has been somewhat successful. She shows no sign of suicidal or homicidal ideations.  ALLERGIES: Allergies  Allergen Reactions  . Ivp Dye [Iodinated Diagnostic Agents] Shortness Of Breath and Other (See Comments)    Swelling of extremity of the injection site  . Latex Itching    ITCHING AND COLD SORES AROUND MOUTH WHEN LATEX GLOVES USED BY THE DENTIST/HYGENTIST  . Clarithromycin Nausea And Vomiting    REACTION: nausea  vomiting  . Iodine   . Lisinopril   . Metronidazole Nausea And Vomiting  . Shellfish Allergy   . Zostavax [Zoster Vaccine Live]     MEDICATIONS: Current Outpatient Medications on File Prior to Visit  Medication Sig Dispense Refill  . albuterol (PROVENTIL HFA;VENTOLIN HFA) 108 (90 Base) MCG/ACT inhaler Inhale 2 puffs into the lungs every 4 (four) hours as needed for wheezing or shortness of breath. 1 Inhaler 3  . aspirin 81 MG EC tablet Take 81 mg by mouth daily.      . budesonide-formoterol (SYMBICORT) 160-4.5 MCG/ACT inhaler INHALE 2 PUFFS INTO THE LUNGS TWICE DAILY 10.2 g 6  . Cholecalciferol (VITAMIN D) 2000 UNITS CAPS Take 2 capsules by mouth daily.    . fexofenadine (ALLEGRA) 180 MG tablet Take 180 mg by mouth daily.      . fluticasone (FLONASE) 50 MCG/ACT nasal spray Place 2 sprays into both nostrils daily. 16 g 6  . gabapentin (NEURONTIN) 300 MG capsule TAKE 1 CAPSULE BY  MOUTH THREE TIMES A DAY 90 capsule 1  . KLOR-CON M20 20 MEQ tablet TAKE 2 TABLETS (40 MEQ TOTAL) BY MOUTH DAILY  3  . levothyroxine (SYNTHROID, LEVOTHROID) 100 MCG tablet Take 1 tablet (100 mcg total) by mouth daily before breakfast. 90 tablet 3  . losartan (COZAAR) 50 MG tablet TAKE 1 TABLET BY MOUTH EVERY DAY 30 tablet 0  . Magnesium Oxide 500 MG TABS Take by mouth.    . Multiple Vitamin (MULTIVITAMIN) capsule Take 1 capsule by mouth daily.      [name] nystatin-triamcinolone (MYCOLOG II) cream Apply 1 application topically 2 (two) times daily. 60 g  0  . omeprazole (PRILOSEC) 20 MG capsule Take 1 capsule (20 mg total) by mouth daily. 90 capsule 3  . potassium chloride (KLOR-CON) 8 MEQ tablet Take 8 mEq daily by mouth.    . rosuvastatin (CRESTOR) 10 MG tablet TAKE ONE TABLET BY MOUTH DAILY 180 tablet 1  . torsemide (DEMADEX) 20 MG tablet 60 mg in the morning and evening time.    [name] ketoconazole (NIZORAL) 2 % cream Apply 1 application topically daily. (Patient not taking: Reported on 08/23/2018) 15 g 0  . valACYclovir (VALTREX) 1000 MG tablet Take 500 mg by mouth daily.  0  . Vitamin D, Ergocalciferol, (DRISDOL) 50000 units CAPS capsule TAKE 1 CAPSULE (50,000 UNITS TOTAL) EVERY 7 (SEVEN) DAYS BY MOUTH. (Patient not taking: Reported on 08/23/2018) 12 capsule 0   No current facility-administered medications on file prior to visit.     PAST MEDICAL HISTORY: Past Medical History:  Diagnosis Date  . ALLERGIC RHINITIS 05/09/2008  . Allergy   . ANXIETY 05/23/2007  . ARM PAIN, LEFT 10/23/2009  . Arthritis   . Asthma   . ASTHMA 05/23/2007  . Back pain   . Cardiomyopathy ([place]) 09/08/2015  . Chest pain   . CHF (congestive heart failure) ([place])   . Constipation   . CONTACT DERMATITIS 06/09/2009  . Diabetes mellitus   . ENDOMETRIOSIS NOS 05/23/2007  . FACIAL PAIN 06/02/2010  . Food allergy   . FREQUENCY, URINARY 05/17/2010  . GLUCOSE INTOLERANCE 08/28/2009  . HERPES SIMPLEX, UNCOMPLICATED 1/32/4401  . Hyperlipidemia 08/28/2015  . HYPERTHYROIDISM 05/23/2007  . Hypoactive thyroid   . HYPOTHYROIDISM 05/09/2008  . Impaired glucose tolerance 01/28/2011  . INSOMNIA, HX OF 05/23/2007  . Joint pain   . LATERAL EPICONDYLITIS, RIGHT 03/10/2009  . LIPOMA 10/22/2010  . NECK PAIN 11/10/2010  . OBESITY 05/23/2007  . Plantar fasciitis    Both feet  . SHOULDER PAIN, LEFT 05/17/2010  . SINUSITIS- ACUTE-NOS 11/03/2008  . SKIN LESION 11/10/2010  . Sleep apnea   . Stage III chronic kidney disease ([place])   . Unspecified Chest Pain 07/25/2008  . UNSPECIFIED URTICARIA  06/04/2009  . URI 08/28/2009  . UTI 04/29/2008  . VITAMIN D DEFICIENCY 08/28/2009  . Wheezing 07/12/2010    PAST SURGICAL HISTORY: Past Surgical History:  Procedure Laterality Date  . COLONOSCOPY    . ENDOMETRIAL ABLATION    . LAMINECTOMY  1996  . LAPAROTOMY     exploratory  . lower back surgery    . POLYPECTOMY    . s/p endometrial ablation  12/06  . TUBAL LIGATION    . UTERINE FIBROID SURGERY    . WISDOM TOOTH EXTRACTION      SOCIAL HISTORY: Social History   Tobacco Use  . Smoking status: Former Smoker    Packs/day: 0.50    Types: Cigarettes    Last attempt to  quit: 11/05/1987    Years since quitting: 30.8  . Smokeless tobacco: Never Used  Substance Use Topics  . Alcohol use: No    Alcohol/week: 1.0 standard drinks    Types: 1 Glasses of wine per week    Comment: WINE  . Drug use: No    FAMILY HISTORY: Family History  Problem Relation Age of Onset  . Diabetes Cousin   . Hypertension Cousin   . Heart disease Cousin   . Heart disease Mother        Rheumatic heart disease  . Depression Mother   . COPD Father   . Hypertension Father   . Cancer Maternal Grandmother        Breast  . Diabetes Sister   . Kidney disease Sister     ROS: Review of Systems  Constitutional: Positive for weight loss.  Genitourinary:       Negative for polyuria.  Endo/Heme/Allergies: Negative for polydipsia.  Psychiatric/Behavioral: Positive for depression. Negative for suicidal ideas.       Negative for homicidal ideations.    PHYSICAL EXAM: Blood pressure 102/68, pulse 90, temperature 98 F (36.7 C), temperature source Oral, height 5\' 4"  (1.626 m), weight 238 lb (108 kg), SpO2 98 %. Body mass index is 40.85 kg/m. Physical Exam  Constitutional: She is oriented to person, place, and time. She appears well-developed and well-nourished.  Cardiovascular: Normal rate.  Pulmonary/Chest: Effort normal.  Musculoskeletal: Normal range of motion.  Neurological: She is oriented to  person, place, and time.  Skin: Skin is warm and dry.  Psychiatric: She has a normal mood and affect. Her behavior is normal.  Vitals reviewed.   RECENT LABS AND TESTS: BMET    Component Value Date/Time   NA 140 05/16/2018 0924   K 4.1 05/16/2018 0924   CL 102 05/16/2018 0924   CO2 30 05/16/2018 0924   GLUCOSE 106 (H) 05/16/2018 0924   BUN 31 (H) 05/16/2018 0924   CREATININE 1.99 (H) 05/16/2018 0924   CREATININE 0.97 09/17/2015 1113   CALCIUM 10.0 05/16/2018 0924   GFRNONAA 48 (L) 11/18/2016 1554   GFRAA 55 (L) 11/18/2016 1554   Lab Results  Component Value Date   HGBA1C 5.6 08/09/2018   HGBA1C 5.7 (A) 05/04/2018   HGBA1C 6.0 09/27/2017   HGBA1C 5.9 03/21/2017   HGBA1C 5.6 02/16/2016   Lab Results  Component Value Date   INSULIN 17.9 08/09/2018   CBC    Component Value Date/Time   WBC 6.8 05/16/2018 0924   RBC 3.94 05/16/2018 0924   HGB 12.2 05/16/2018 0924   HCT 36.3 05/16/2018 0924   PLT 276.0 05/16/2018 0924   MCV 92.3 05/16/2018 0924   MCH 31.4 11/18/2016 1554   MCHC 33.5 05/16/2018 0924   RDW 13.3 05/16/2018 0924   LYMPHSABS 2.5 05/16/2018 0924   MONOABS 0.6 05/16/2018 0924   EOSABS 0.1 05/16/2018 0924   BASOSABS 0.1 05/16/2018 0924   Iron/TIBC/Ferritin/ %Sat    Component Value Date/Time   IRON 55 03/21/2017 0935   Lipid Panel     Component Value Date/Time   CHOL 144 05/16/2018 0924   TRIG 66.0 05/16/2018 0924   HDL 81.10 05/16/2018 0924   CHOLHDL 2 05/16/2018 0924   VLDL 13.2 05/16/2018 0924   LDLCALC 50 05/16/2018 0924   Hepatic Function Panel     Component Value Date/Time   PROT 7.1 05/16/2018 0924   ALBUMIN 4.1 05/16/2018 0924   AST 14 05/16/2018 0924   ALT 19 05/16/2018  1497   WYOVZCH 88 05/16/2018 0924   BILITOT 0.5 05/16/2018 0924   BILIDIR 0.1 05/16/2018 0924      Component Value Date/Time   TSH 0.132 (L) 08/09/2018 1003   TSH 0.41 05/16/2018 0924   TSH 0.75 12/25/2017 1026   ASSESSMENT AND PLAN: Prediabetes  Other  specified hypothyroidism  Obstructive sleep apnea syndrome  Stage 3 chronic kidney disease (HCC)  Other depression - with emotional eating  - Plan: topiramate (TOPAMAX) 50 MG tablet  At risk for diabetes mellitus  Class 3 severe obesity with serious comorbidity and body mass index (BMI) of 40.0 to 44.9 in adult, unspecified obesity type ([place])  PLAN:  Pre-Diabetes Suzanne Hansen will continue to work on weight loss, exercise, and decreasing simple carbohydrates in her diet to help decrease the risk of diabetes. She was informed that eating too many simple carbohydrates or too many calories at one sitting increases the likelihood of GI side effects. She agrees to decrease her carbohydrates and increase protein in her diet. Suzanne Hansen agreed to follow up with [place] as directed to monitor her progress in 2 weeks.  Diabetes risk counseling Suzanne Hansen was given extended (15 minutes) diabetes prevention counseling today. She is 62 y.o. female and has risk factors for diabetes including pre-diabetes and obesity. We discussed intensive lifestyle modifications today with an emphasis on weight loss as well as increasing exercise and decreasing simple carbohydrates in her diet.  Hypothyroid Suzanne Hansen was informed of the importance of good thyroid control to help with weight loss efforts. She was also informed that supertherapeutic thyroid levels are dangerous and will not improve weight loss results. Her PCP adjusted her medication from levothyroxine 119mcg to 132mcg.  Sleep Apnea Suzanne Hansen agrees to continue wearing her oral appliance.  Chronic Kidney Disease, Stage 3, GFR-45 Suzanne Hansen will monitor and follow up with her nephrologist.  Depression with Emotional Eating Behaviors We discussed behavior modification techniques today to help Suzanne Hansen deal with her emotional eating and depression. She has agreed to continue to take Topamax 500mg  qd #30 with no refills and agreed to follow up as directed in 2  weeks.  Obesity Suzanne Hansen is currently in the action stage of change. As such, her goal is to continue with weight loss efforts. She has agreed to follow the Category 3 plan. She was given the "Thanksgiving" handout and she will continue to pick good snacks. Suzanne Hansen has been instructed to work up to a goal of 150 minutes of combined cardio and strengthening exercise per week for weight loss and overall health benefits. We discussed the following Behavioral Modification Strategies today: increasing lean protein intake, decreasing simple carbohydrates, increasing vegetables, increase H2O intake, decrease eating out, no skipping meals, and work on meal planning and easy cooking plans.  Suzanne Hansen has agreed to follow up with our clinic in 2 weeks. She was informed of the importance of frequent follow up visits to maximize her success with intensive lifestyle modifications for her multiple health conditions.   OBESITY BEHAVIORAL INTERVENTION VISIT  Today's visit was # 2   Starting weight: 241 lbs Starting date: 08/09/18 Today's weight : Weight: 238 lb (108 kg)  Today's date: 08/23/2018 Total lbs lost to date: 3  ASK: We discussed the diagnosis of obesity with Suzanne Hansen today and Suzanne Hansen agreed to give [place] permission to discuss obesity behavioral modification therapy today.  ASSESS: Suzanne Hansen has the diagnosis of obesity and her BMI today is 40.83. Suzanne Hansen is in the action stage of change.   ADVISE: Suzanne Hansen was  educated on the multiple health risks of obesity as well as the benefit of weight loss to improve her health. She was advised of the need for long term treatment and the importance of lifestyle modifications to improve her current health and to decrease her risk of future health problems.  AGREE: Multiple dietary modification options and treatment options were discussed and Suzanne Hansen agreed to follow the recommendations documented in the above note.  ARRANGE: Suzanne Hansen was  educated on the importance of frequent visits to treat obesity as outlined per CMS and USPSTF guidelines and agreed to schedule her next follow up appointment today.  I, Marcille Blanco, am acting as Location manager for [employer]. Owens Shark, DO  I have reviewed the above documentation for accuracy and completeness, and I agree with the above. -Jearld Lesch, DO

## 2018-09-11 ENCOUNTER — Ambulatory Visit (INDEPENDENT_AMBULATORY_CARE_PROVIDER_SITE_OTHER): Payer: Managed Care, Other (non HMO) | Admitting: Bariatrics

## 2018-09-11 ENCOUNTER — Encounter (INDEPENDENT_AMBULATORY_CARE_PROVIDER_SITE_OTHER): Payer: Self-pay | Admitting: Bariatrics

## 2018-09-11 VITALS — BP 109/72 | HR 99 | Temp 98.0°F | Ht 64.0 in | Wt 237.0 lb

## 2018-09-11 DIAGNOSIS — F3289 Other specified depressive episodes: Secondary | ICD-10-CM

## 2018-09-11 DIAGNOSIS — E559 Vitamin D deficiency, unspecified: Secondary | ICD-10-CM

## 2018-09-11 DIAGNOSIS — R7303 Prediabetes: Secondary | ICD-10-CM | POA: Diagnosis not present

## 2018-09-11 DIAGNOSIS — E66813 Obesity, class 3: Secondary | ICD-10-CM

## 2018-09-11 DIAGNOSIS — Z6841 Body Mass Index (BMI) 40.0 and over, adult: Secondary | ICD-10-CM

## 2018-09-14 NOTE — Progress Notes (Signed)
Office: [phone]  /  Fax: [phone]   HPI:   Chief Complaint: OBESITY Suzanne Hansen is here to discuss her progress with her obesity treatment plan. She is on the  follow the Category 3 plan and is following her eating plan approximately 100 % of the time. She states she is exercising 0 minutes 0 times per week. Suzanne Hansen has not struggled that much during the holiday.  Her weight is 237 lb (107.5 kg) today and has had a weight loss of 1 pounds over a period of 3 weeks since her last visit. She has lost 4 lbs since starting treatment with [place].  Pre-Diabetes Suzanne Hansen has a diagnosis of prediabetes based on her elevated HgA1c of 5.6 and Insulin level of 17.9 and was informed this puts her at greater risk of developing diabetes. She is not taking metformin currently and continues to work on diet and exercise to decrease risk of diabetes. She denies nausea or hypoglycemia.  Vitamin D deficiency Suzanne Hansen has a diagnosis of vitamin D deficiency. She is currently taking vit D and denies nausea, vomiting or muscle weakness.  Depression with emotional eating behaviors Suzanne Hansen is struggling with emotional eating and using food for comfort to the extent that it is negatively impacting her health. She often snacks when she is not hungry. Suzanne Hansen sometimes feels she is out of control and then feels guilty that she made poor food choices. She has been working on behavior modification techniques to help reduce her emotional eating and has been somewhat successful. She shows no sign of suicidal or homicidal ideations. She is on topamax at night.   Depression screen Suzanne Hansen 2/9 08/09/2018 05/16/2018 09/27/2017 06/14/2017 02/16/2016  Decreased Interest 2 0 0 0 0  Down, Depressed, Hopeless 1 1 0 0 0  PHQ - 2 Score 3 1 0 0 0  Altered sleeping 2 - - - -  Tired, decreased energy 3 - - - -  Change in appetite 1 - - - -  Feeling bad or failure about yourself  1 - - - -  Trouble concentrating 0 - - - -  Moving slowly  or fidgety/restless 2 - - - -  Suicidal thoughts 0 - - - -  PHQ-9 Score 12 - - - -  Difficult doing work/chores Somewhat difficult - - - -  Some recent data might be hidden    ALLERGIES: Allergies  Allergen Reactions  . Ivp Dye [Iodinated Diagnostic Agents] Shortness Of Breath and Other (See Comments)    Swelling of extremity of the injection site  . Latex Itching    ITCHING AND COLD SORES AROUND MOUTH WHEN LATEX GLOVES USED BY THE DENTIST/HYGENTIST  . Clarithromycin Nausea And Vomiting    REACTION: nausea  vomiting  . Iodine   . Lisinopril   . Metronidazole Nausea And Vomiting  . Shellfish Allergy   . Zostavax [Zoster Vaccine Live]     MEDICATIONS: Current Outpatient Medications on File Prior to Visit  Medication Sig Dispense Refill  . albuterol (PROVENTIL HFA;VENTOLIN HFA) 108 (90 Base) MCG/ACT inhaler Inhale 2 puffs into the lungs every 4 (four) hours as needed for wheezing or shortness of breath. 1 Inhaler 3  . aspirin 81 MG EC tablet Take 81 mg by mouth daily.      . budesonide-formoterol (SYMBICORT) 160-4.5 MCG/ACT inhaler INHALE 2 PUFFS INTO THE LUNGS TWICE DAILY 10.2 g 6  . Cholecalciferol (VITAMIN D) 2000 UNITS CAPS Take 2 capsules by mouth daily.    . fexofenadine (ALLEGRA)  180 MG tablet Take 180 mg by mouth daily.      . fluticasone (FLONASE) 50 MCG/ACT nasal spray Place 2 sprays into both nostrils daily. 16 g 6  . gabapentin (NEURONTIN) 300 MG capsule TAKE 1 CAPSULE BY MOUTH THREE TIMES A DAY 90 capsule 1  . ketoconazole (NIZORAL) 2 % cream Apply 1 application topically daily. 15 g 0  . KLOR-CON M20 20 MEQ tablet TAKE 2 TABLETS (40 MEQ TOTAL) BY MOUTH DAILY  3  . levothyroxine (SYNTHROID, LEVOTHROID) 100 MCG tablet Take 1 tablet (100 mcg total) by mouth daily before breakfast. 90 tablet 3  . losartan (COZAAR) 50 MG tablet TAKE 1 TABLET BY MOUTH EVERY DAY 30 tablet 0  . Magnesium Oxide 500 MG TABS Take by mouth.    . Multiple Vitamin (MULTIVITAMIN) capsule Take 1  capsule by mouth daily.      [name] nystatin-triamcinolone (MYCOLOG II) cream Apply 1 application topically 2 (two) times daily. 60 g 0  . omeprazole (PRILOSEC) 20 MG capsule Take 1 capsule (20 mg total) by mouth daily. 90 capsule 3  . potassium chloride (KLOR-CON) 8 MEQ tablet Take 8 mEq daily by mouth.    . rosuvastatin (CRESTOR) 10 MG tablet TAKE ONE TABLET BY MOUTH DAILY 180 tablet 1  . topiramate (TOPAMAX) 50 MG tablet Take 1 tablet (50 mg total) by mouth daily. 30 tablet 0  . torsemide (DEMADEX) 20 MG tablet 60 mg in the morning and evening time.    . valACYclovir (VALTREX) 1000 MG tablet Take 500 mg by mouth daily.  0  . Vitamin D, Ergocalciferol, (DRISDOL) 50000 units CAPS capsule TAKE 1 CAPSULE (50,000 UNITS TOTAL) EVERY 7 (SEVEN) DAYS BY MOUTH. 12 capsule 0   No current facility-administered medications on file prior to visit.     PAST MEDICAL HISTORY: Past Medical History:  Diagnosis Date  . ALLERGIC RHINITIS 05/09/2008  . Allergy   . ANXIETY 05/23/2007  . ARM PAIN, LEFT 10/23/2009  . Arthritis   . Asthma   . ASTHMA 05/23/2007  . Back pain   . Cardiomyopathy ([place]) 09/08/2015  . Chest pain   . CHF (congestive heart failure) ([place])   . Constipation   . CONTACT DERMATITIS 06/09/2009  . Diabetes mellitus   . ENDOMETRIOSIS NOS 05/23/2007  . FACIAL PAIN 06/02/2010  . Food allergy   . FREQUENCY, URINARY 05/17/2010  . GLUCOSE INTOLERANCE 08/28/2009  . HERPES SIMPLEX, UNCOMPLICATED 9/37/1696  . Hyperlipidemia 08/28/2015  . HYPERTHYROIDISM 05/23/2007  . Hypoactive thyroid   . HYPOTHYROIDISM 05/09/2008  . Impaired glucose tolerance 01/28/2011  . INSOMNIA, HX OF 05/23/2007  . Joint pain   . LATERAL EPICONDYLITIS, RIGHT 03/10/2009  . LIPOMA 10/22/2010  . NECK PAIN 11/10/2010  . OBESITY 05/23/2007  . Plantar fasciitis    Both feet  . SHOULDER PAIN, LEFT 05/17/2010  . SINUSITIS- ACUTE-NOS 11/03/2008  . SKIN LESION 11/10/2010  . Sleep apnea   . Stage III chronic kidney disease ([place])   .  Unspecified Chest Pain 07/25/2008  . UNSPECIFIED URTICARIA 06/04/2009  . URI 08/28/2009  . UTI 04/29/2008  . VITAMIN D DEFICIENCY 08/28/2009  . Wheezing 07/12/2010    PAST SURGICAL HISTORY: Past Surgical History:  Procedure Laterality Date  . COLONOSCOPY    . ENDOMETRIAL ABLATION    . LAMINECTOMY  1996  . LAPAROTOMY     exploratory  . lower back surgery    . POLYPECTOMY    . s/p endometrial ablation  12/06  . TUBAL LIGATION    .  UTERINE FIBROID SURGERY    . WISDOM TOOTH EXTRACTION      SOCIAL HISTORY: Social History   Tobacco Use  . Smoking status: Former Smoker    Packs/day: 0.50    Types: Cigarettes    Last attempt to quit: 11/05/1987    Years since quitting: 30.8  . Smokeless tobacco: Never Used  Substance Use Topics  . Alcohol use: No    Alcohol/week: 1.0 standard drinks    Types: 1 Glasses of wine per week    Comment: WINE  . Drug use: No    FAMILY HISTORY: Family History  Problem Relation Age of Onset  . Diabetes Cousin   . Hypertension Cousin   . Heart disease Cousin   . Heart disease Mother        Rheumatic heart disease  . Depression Mother   . COPD Father   . Hypertension Father   . Cancer Maternal Grandmother        Breast  . Diabetes Sister   . Kidney disease Sister     ROS: Review of Systems  Constitutional: Positive for weight loss.  Gastrointestinal: Negative for nausea and vomiting.  Musculoskeletal:       Negative for muscle weakness  Endo/Heme/Allergies:       Negative for hypoglycemia  Psychiatric/Behavioral: Positive for depression. Negative for suicidal ideas.       Negative for homicidal ideations    PHYSICAL EXAM: Blood pressure 109/72, pulse 99, temperature 98 F (36.7 C), temperature source Oral, height 5\' 4"  (1.626 m), weight 237 lb (107.5 kg), SpO2 100 %. Body mass index is 40.68 kg/m. Physical Exam  Constitutional: She is oriented to person, place, and time. She appears well-developed and well-nourished.  HENT:    Head: Normocephalic.  Eyes: Pupils are equal, round, and reactive to light.  Neck: Normal range of motion.  Cardiovascular: Normal rate.  Pulmonary/Chest: Effort normal.  Musculoskeletal: Normal range of motion.  Neurological: She is alert and oriented to person, place, and time.  Skin: Skin is warm and dry.  Psychiatric: She has a normal mood and affect. Her behavior is normal.  Vitals reviewed.   RECENT LABS AND TESTS: BMET    Component Value Date/Time   NA 140 05/16/2018 0924   K 4.1 05/16/2018 0924   CL 102 05/16/2018 0924   CO2 30 05/16/2018 0924   GLUCOSE 106 (H) 05/16/2018 0924   BUN 31 (H) 05/16/2018 0924   CREATININE 1.99 (H) 05/16/2018 0924   CREATININE 0.97 09/17/2015 1113   CALCIUM 10.0 05/16/2018 0924   GFRNONAA 48 (L) 11/18/2016 1554   GFRAA 55 (L) 11/18/2016 1554   Lab Results  Component Value Date   HGBA1C 5.6 08/09/2018   HGBA1C 5.7 (A) 05/04/2018   HGBA1C 6.0 09/27/2017   HGBA1C 5.9 03/21/2017   HGBA1C 5.6 02/16/2016   Lab Results  Component Value Date   INSULIN 17.9 08/09/2018   CBC    Component Value Date/Time   WBC 6.8 05/16/2018 0924   RBC 3.94 05/16/2018 0924   HGB 12.2 05/16/2018 0924   HCT 36.3 05/16/2018 0924   PLT 276.0 05/16/2018 0924   MCV 92.3 05/16/2018 0924   MCH 31.4 11/18/2016 1554   MCHC 33.5 05/16/2018 0924   RDW 13.3 05/16/2018 0924   LYMPHSABS 2.5 05/16/2018 0924   MONOABS 0.6 05/16/2018 0924   EOSABS 0.1 05/16/2018 0924   BASOSABS 0.1 05/16/2018 0924   Iron/TIBC/Ferritin/ %Sat    Component Value Date/Time   IRON 55 03/21/2017  0935   Lipid Panel     Component Value Date/Time   CHOL 144 05/16/2018 0924   TRIG 66.0 05/16/2018 0924   HDL 81.10 05/16/2018 0924   CHOLHDL 2 05/16/2018 0924   VLDL 13.2 05/16/2018 0924   LDLCALC 50 05/16/2018 0924   Hepatic Function Panel     Component Value Date/Time   PROT 7.1 05/16/2018 0924   ALBUMIN 4.1 05/16/2018 0924   AST 14 05/16/2018 0924   ALT 19 05/16/2018 0924    ALKPHOS 72 05/16/2018 0924   BILITOT 0.5 05/16/2018 0924   BILIDIR 0.1 05/16/2018 0924      Component Value Date/Time   TSH 0.132 (L) 08/09/2018 1003   TSH 0.41 05/16/2018 0924   TSH 0.75 12/25/2017 1026    ASSESSMENT AND PLAN: Prediabetes  Vitamin D deficiency  Other depression - with emotional eating   Class 3 severe obesity with serious comorbidity and body mass index (BMI) of 40.0 to 44.9 in adult, unspecified obesity type ([place])  PLAN: Pre-Diabetes Suzanne Hansen will continue to work on weight loss, exercise, and decreasing simple carbohydrates in her diet to help decrease the risk of diabetes. We dicussed metformin including benefits and risks. She was informed that eating too many simple carbohydrates or too many calories at one sitting increases the likelihood of GI side effects. Suzanne Hansen will continue to decrease carbs and increase protein intake. Suzanne Hansen agreed to follow up with [place] as directed to monitor her progress.  Vitamin D Deficiency Suzanne Hansen was informed that low vitamin D levels contributes to fatigue and are associated with obesity, breast, and colon cancer. She agrees to continue to take prescription Vit D @50 ,000 IU every week, no refill needed and will follow up for routine testing of vitamin D, at least 2-3 times per year. She was informed of the risk of over-replacement of vitamin D and agrees to not increase her dose unless she discusses this with [place] first. Agrees to follow up with our clinic as directed.   Depression with Emotional Eating Behaviors We discussed behavior modification techniques today to help Suzanne Hansen deal with her emotional eating and depression. She has agreed to continue topamax and agreed to follow up as directed.  I spent > than 50% of the 15 minute visit on counseling as documented in the note.  Obesity Suzanne Hansen is currently in the action stage of change. As such, her goal is to continue with weight loss efforts She has agreed to follow  the Category 3 plan Suzanne Hansen has been instructed to work up to a goal of 150 minutes of combined cardio and strengthening exercise per week for weight loss and overall health benefits. We discussed the following Behavioral Modification Stratagies today: increasing lean protein intake, increasing water intake, no skipping meals,  decreasing simple carbohydrates , increasing vegetables, decrease eating out and work on meal planning and easy cooking plans.    Suzanne Hansen has agreed to follow up with our clinic in 2 weeks. She was informed of the importance of frequent follow up visits to maximize her success with intensive lifestyle modifications for her multiple health conditions.   OBESITY BEHAVIORAL INTERVENTION VISIT  Today's visit was # 3   Starting weight: 241 lb Starting date: 08/09/18 Today's weight : Weight: 237 lb (107.5 kg)  Today's date: 09/11/18 Total lbs lost to date: 4 lb   ASK: We discussed the diagnosis of obesity with Suzanne Hansen today and Suzanne Hansen agreed to give [place] permission to discuss obesity behavioral modification therapy today.  ASSESS:  Suzanne Hansen has the diagnosis of obesity and her BMI today is 40.66 Suzanne Hansen is in the action stage of change   ADVISE: Suzanne Hansen was educated on the multiple health risks of obesity as well as the benefit of weight loss to improve her health. She was advised of the need for long term treatment and the importance of lifestyle modifications to improve her current health and to decrease her risk of future health problems.  AGREE: Multiple dietary modification options and treatment options were discussed and  Suzanne Hansen agreed to follow the recommendations documented in the above note.  ARRANGE: Suzanne Hansen was educated on the importance of frequent visits to treat obesity as outlined per CMS and USPSTF guidelines and agreed to schedule her next follow up appointment today.  Suzanne Hansen, am acting as transcriptionist for [employer],  DO   I have reviewed the above documentation for accuracy and completeness, and I agree with the above. -Suzanne Lesch, DO

## 2018-09-18 ENCOUNTER — Ambulatory Visit (INDEPENDENT_AMBULATORY_CARE_PROVIDER_SITE_OTHER): Payer: Managed Care, Other (non HMO) | Admitting: Neurology

## 2018-09-18 ENCOUNTER — Encounter: Payer: Self-pay | Admitting: Neurology

## 2018-09-18 ENCOUNTER — Encounter

## 2018-09-18 VITALS — BP 124/83 | HR 75 | Ht 64.5 in | Wt 241.0 lb

## 2018-09-18 DIAGNOSIS — I480 Paroxysmal atrial fibrillation: Secondary | ICD-10-CM

## 2018-09-18 DIAGNOSIS — J309 Allergic rhinitis, unspecified: Secondary | ICD-10-CM

## 2018-09-18 DIAGNOSIS — J45909 Unspecified asthma, uncomplicated: Secondary | ICD-10-CM

## 2018-09-18 DIAGNOSIS — G4730 Sleep apnea, unspecified: Secondary | ICD-10-CM

## 2018-09-18 DIAGNOSIS — N183 Chronic kidney disease, stage 3 unspecified: Secondary | ICD-10-CM

## 2018-09-18 DIAGNOSIS — E038 Other specified hypothyroidism: Secondary | ICD-10-CM

## 2018-09-18 DIAGNOSIS — I5020 Unspecified systolic (congestive) heart failure: Secondary | ICD-10-CM | POA: Insufficient documentation

## 2018-09-18 NOTE — Progress Notes (Addendum)
SLEEP MEDICINE CLINIC   Provider:  Larey Seat, MD   Referring Provider: Biagio Borg, MD Primary Care Physician:  Biagio Borg, MD  Chief Complaint  Patient presents with  . Follow-up    pt alone, rm 10. pt attempted using CPAP in past and was unable to use. she started dental device and uses nightly. she saw her cardiologist and she has been told she doesnt have congested heart failure any longer. 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 her cardiologist wanted her to touch base with neurologist/sleep doc. she still snores with dental device. she [facility] in [facility]. her heart doc feels strongly that patient should be on cpap vs the dental     HPI:  Suzanne Hansen is a 62 y.o. female , she was originally seen in May 2017 seen here as a referral from Dentist DR Bennett Scrape, DMD at [facility] [facility] [place].  I have the pleasure of seeing Suzanne Hansen today in a revisit, Almost 3 years after his encounter at her last.  Today is 18 September 2018 and Suzanne Hansen reports that her cardiologist [name], DO at [facility] in [place] wants her to be reevaluated for sleep apnea.  The patient had been evaluated in an attended split-night polysomnography on 29 Feb 2016 at the time she had a baseline AHI of 17.3 mostly hypercapnia related, REM AHI was 52.6 she did not have oxygen desaturations of significance.  She was titrated to CPAP at 9 cmH2O the AHI became 0.0 at that setting and she tolerated supine positional sleep without further apneas, she also had a significant amount of REM sleep rebounding.  The patient started CPAP late May 2017 at home but she contracted frequent upper respiratory infections, sinusitis and she suffers from a easily congested nose and sinus symptoms.  She felt that CPAP was not comfortable for her to wear and I had never seen her after the trial of CPAP and she decided to go with a dental appliance instead.  Her  daughter now tells her that the dental appliance has not eliminated her snoring, as she could hear her mother snore.  Her heart failure specialist is also concerned that the dental appliance does nothing to treat the underlying REM dependent apnea, which is true.  Her family has noticed that she continues to have apneas while using the mouthpiece. The mouth piece cannot be adjusted ay further.    I have also the opportunity to read her cardiologist notes, [name], according to the visit notes Suzanne Hansen is described as a 62 year old female with obesity, with prior mild systolic heart failure but now normal ejection fraction after a right heart catheterization, paroxysmal atrial fibrillation.  Longstanding dyspnea and chest pain she has been on diuretics for several years.  Left ejection fraction had been 40 to 50% since 2014 or longer she is intolerant of lisinopril to the cough, she is not on anticoagulation for paroxysmal atrial fibrillation.  She is a borderline diabetic she is on statin for high lipids, she uses a mouth appliance for sleep apnea.  She was seen in November 2018 for chest pain had a negative stress test was given Lasix in the ED and had not taken her diuretics at home that day.  She received a steroid injection for hip pain, she had no improvement after a nerve ablation but finally found some improvement after the cyst was removed from the  lumbar spine, she has an appointment with nephrology based on her cardiology referral.  Her white blood cell count had been 11.3 hemoglobin 12.4 hematocrit 35.9, AST 24, AST 20 sodium 139 potassium 3.8 in August 2019, creatinine was 1.75 which is elevated on May 17, 2018.  BUN was high at 29.  Nephrology visit at [facility] was in 3rd October 2019, the patient was started on allopurinol.    Suzanne Hansen presents with Asthma, recurrent bronchitis, sinusitis,  hypothyroidism and weight gain ( BMI is now 61), and a  diagnosis of cardiomyopathy. Her  cardiomyopathy progressed into heart failure, CKD stage 3 , and she developed leg edema. She knows she snores, because her throat is sore and her mouth is dry in AM, her daughters tell her she stops breathing and snores loudly.   Sleep habits are as follows: She usually returns home after work feeling almost ready to go to bed. She will be on her comfort close by 7 or 8 PM average bedtime is around 10 PM. She sleeps on one pillow, does not elevate the head of bed. She falls asleep on her side, but she wakes up she is often on her back. She usually falls asleep easily and she feels that she sleeps through the night some nights, others she will just wake up for no reason.  Sometimes she will gasp for air or seemingly snore herself awake,( mostly when falling asleep in a chair). She will have three  bathroom breaks at night on average, rarely 4-5 . Wakes up spontaneously at about 6.00 AM but she needs to be up by 7.AM.  No naps in daytime- work is busy. She may doze when not physically active or mentally stimulated and she does often feel sleepier when she drives, helping herself to some caffeine on the way.  Sleep and additional medical history :As a child she used to sleep with a night light on, he sometimes eating a big light. She had some nightmares the usual pre-school or fear of monsters. She has no history of sleepwalking so of true night terrors , but of enuresis.  She had a cyst removed from her spine 06-2017, and since her left leg bothers her "not as much".   Social history: She is divorced after 25 years of marriage, sleeps alone with her dog.  Adult  daughters, one married, one a Conservation officer, historic buildings, and the youngest one finishes her masters in education.  The youngest lives with her.  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 Caffeine - one cup in AM and rarely uses caffeine -  ETOH rare, less than one a week.  Tobacco use- in college  Sept 1977, and  stopped with her first pregnancy - 5 cig a day.   Review of Systems: Out of a complete 14 system review, the patient complains of only the following symptoms, and chronic kidney disease. ll other reviewed systems are negative. Nocturia.  Snoring , ankle edema. Apnea. Stiffness, SOB, weight gain.   Epworth Sleepiness score at 13 points , Fatigue severity score 60/63   , depression score PHQ 9 - point, not clinically depressed . Geriatric depression 5/ 15 points    Social History   Socioeconomic History  . Marital status: Divorced    Spouse name: Not on file  . Number of children: 3  . Years of education: Not on file  . Highest education level: Not on file  Occupational History  . Occupation:  SOCIAL WORKER    Employer: [employer]    Comment: [employer]  . Financial resource strain: Not on file  . Food insecurity:    Worry: Not on file    Inability: Not on file  . Transportation needs:    Medical: Not on file    Non-medical: Not on file  Tobacco Use  . Smoking status: Former Smoker    Packs/day: 0.50    Types: Cigarettes    Last attempt to quit: 11/05/1987    Years since quitting: 30.8  . Smokeless tobacco: Never Used  Substance and Sexual Activity  . Alcohol use: No    Alcohol/week: 1.0 standard drinks    Types: 1 Glasses of wine per week    Comment: WINE  . Drug use: No  . Sexual activity: Not on file  Lifestyle  . Physical activity:    Days per week: Not on file    Minutes per session: Not on file  . Stress: Not on file  Relationships  . Social connections:    Talks on phone: Not on file    Gets together: Not on file    Attends religious service: Not on file    Active member of club or organization: Not on file    Attends meetings of clubs or organizations: Not on file    Relationship status: Not on file  . Intimate partner violence:    Fear of current or ex partner: Not on file    Emotionally abused: Not on file    Physically  abused: Not on file    Forced sexual activity: Not on file  Other Topics Concern  . Not on file  Social History Narrative  . Not on file    Family History  Problem Relation Age of Onset  . Diabetes Cousin   . Hypertension Cousin   . Heart disease Cousin   . Heart disease Mother        Rheumatic heart disease  . Depression Mother   . COPD Father   . Hypertension Father   . Cancer Maternal Grandmother        Breast  . Diabetes Sister   . Kidney disease Sister     Past Medical History:  Diagnosis Date  . ALLERGIC RHINITIS 05/09/2008  . Allergy   . ANXIETY 05/23/2007  . ARM PAIN, LEFT 10/23/2009  . Arthritis   . Asthma   . ASTHMA 05/23/2007  . Back pain   . Cardiomyopathy ([place]) 09/08/2015  . Chest pain   . CHF (congestive heart failure) ([place])   . Constipation   . CONTACT DERMATITIS 06/09/2009  . Diabetes mellitus   . ENDOMETRIOSIS NOS 05/23/2007  . FACIAL PAIN 06/02/2010  . Food allergy   . FREQUENCY, URINARY 05/17/2010  . GLUCOSE INTOLERANCE 08/28/2009  . HERPES SIMPLEX, UNCOMPLICATED 0/25/4270  . Hyperlipidemia 08/28/2015  . HYPERTHYROIDISM 05/23/2007  . Hypoactive thyroid   . HYPOTHYROIDISM 05/09/2008  . Impaired glucose tolerance 01/28/2011  . INSOMNIA, HX OF 05/23/2007  . Joint pain   . LATERAL EPICONDYLITIS, RIGHT 03/10/2009  . LIPOMA 10/22/2010  . NECK PAIN 11/10/2010  . OBESITY 05/23/2007  . Plantar fasciitis    Both feet  . SHOULDER PAIN, LEFT 05/17/2010  . SINUSITIS- ACUTE-NOS 11/03/2008  . SKIN LESION 11/10/2010  . Sleep apnea   . Stage III chronic kidney disease ([place])   . Unspecified Chest Pain 07/25/2008  . UNSPECIFIED URTICARIA 06/04/2009  . URI 08/28/2009  . UTI  04/29/2008  . VITAMIN D DEFICIENCY 08/28/2009  . Wheezing 07/12/2010    Past Surgical History:  Procedure Laterality Date  . COLONOSCOPY    . ENDOMETRIAL ABLATION    . LAMINECTOMY  1996  . LAPAROTOMY     exploratory  . lower back surgery    . POLYPECTOMY    . s/p endometrial ablation  12/06  .  TUBAL LIGATION    . UTERINE FIBROID SURGERY    . WISDOM TOOTH EXTRACTION      Current Outpatient Medications  Medication Sig Dispense Refill  . albuterol (PROVENTIL HFA;VENTOLIN HFA) 108 (90 Base) MCG/ACT inhaler Inhale 2 puffs into the lungs every 4 (four) hours as needed for wheezing or shortness of breath. 1 Inhaler 3  . aspirin 81 MG EC tablet Take 81 mg by mouth daily.      . budesonide-formoterol (SYMBICORT) 160-4.5 MCG/ACT inhaler INHALE 2 PUFFS INTO THE LUNGS TWICE DAILY 10.2 g 6  . KLOR-CON M20 20 MEQ tablet TAKE 2 TABLETS (40 MEQ TOTAL) BY MOUTH DAILY  3  . levothyroxine (SYNTHROID, LEVOTHROID) 100 MCG tablet Take 1 tablet (100 mcg total) by mouth daily before breakfast. 90 tablet 3  . Multiple Vitamin (MULTIVITAMIN) capsule Take 1 capsule by mouth daily.      . rosuvastatin (CRESTOR) 10 MG tablet TAKE ONE TABLET BY MOUTH DAILY 180 tablet 1  . topiramate (TOPAMAX) 50 MG tablet Take 1 tablet (50 mg total) by mouth daily. 30 tablet 0  . torsemide (DEMADEX) 20 MG tablet 40mg  in the morning and 40mg  evening time.    . valACYclovir (VALTREX) 1000 MG tablet Take 500 mg by mouth daily.  0  . fexofenadine (ALLEGRA) 180 MG tablet Take 180 mg by mouth daily.      . fluticasone (FLONASE) 50 MCG/ACT nasal spray Place 2 sprays into both nostrils daily. 16 g 6  . gabapentin (NEURONTIN) 300 MG capsule TAKE 1 CAPSULE BY MOUTH THREE TIMES A DAY 90 capsule 1   No current facility-administered medications for this visit.     Allergies as of 09/18/2018 - Review Complete 09/18/2018  Allergen Reaction Noted  . Ivp dye [iodinated diagnostic agents] Shortness Of Breath and Other (See Comments) 01/02/2013  . Latex Itching 01/02/2013  . Clarithromycin Nausea And Vomiting 09/09/2015  . Iodine  08/09/2018  . Lisinopril  08/09/2018  . Metronidazole Nausea And Vomiting 05/23/2007  . Shellfish allergy  08/09/2018  . Zostavax [zoster vaccine live]  09/27/2017    Vitals: BP 124/83   Pulse 75   Ht 5'  4.5" (1.638 m)   Wt 241 lb (109.3 kg)   BMI 40.73 kg/m  Last Weight:  Wt Readings from Last 1 Encounters:  09/18/18 241 lb (109.3 kg)   WYO:VZCH mass index is 40.73 kg/m.     Last Height:   Ht Readings from Last 1 Encounters:  09/18/18 5' 4.5" (1.638 m)    Physical exam:  General: The patient is awake, alert and appears not in acute distress. The patient is well groomed. Head: Normocephalic, atraumatic. Neck is supple. Mallampati 3 neck circumference: 16. Nasal airflow with congestion-, TMJ ; no click  evident . Retrognathia is seen.  Cardiovascular:  Regular rate and rhythm,  without  murmurs or carotid bruit, and without distended neck veins. Respiratory: Lungs are clear to auscultation Skin:  Without evidence of edema, or rash Trunk: BMI is elevated  The patient's posture is erect   Neurologic exam : The patient is awake and alert, oriented  to place and time.   Memory subjective described as intact.  Attention span & concentration ability appears normal.  Speech is fluent,  without  dysarthria, dysphonia or aphasia.  Mood and affect are appropriate.  Cranial nerves:Pupils are equal and briskly reactive to light. Hearing intact. Facial sensation intact to fine touch. Facial motor strength is symmetric and tongue and uvula move midline. Shoulder shrug was symmetrical.  Motor exam:   Normal tone, muscle bulk and symmetric strength in all extremities. Sensory:  Fine touch, pinprick and vibration were not tested in all extremities.  Proprioception tested in the upper extremities was normal. Coordination:  normal without evidence of ataxia, dysmetria or tremor. Gait and station: Patient walks without assistive device  Deep tendon reflexes: in the upper and lower extremities are symmetric and intact.   The patient was advised of the nature of the diagnosed sleep disorder , the treatment options and risks for general a health and wellness arising from not treating the condition.  I  spent more than 40 minutes of face to face time with the patient. Greater than 50% of time was spent in counseling and coordination of care. We have discussed the diagnosis and differential and I answered the patient's questions.     Addendum:09-25-2018 :  I researched the patient outside lab studies [facility], NP visit - The patient had indeed a one time elevated uric acid level, and was asked to take Allopurinol after this visit. There have been studies associating elevated uric acid levels to progression in renal failure, that may have been the thought process.  The patient was concerned that 2 treating providers have since questioned the need for this medication and she has not taken it.    Assessment:  After interview and neurologic examination, review of laboratory studies,  Personal review of imaging studies, reports of other /same  Imaging studies ,  Results of polysomnography/ neurophysiology testing and pre-existing records as far as provided in visit., my assessment is   1) Mrs. Manner, a 5 year old mother of 3 presented today with a history of snoring and witnessed apneas. She has struggled with obesity, asthma, hypothyroidism and most recently cardiomyopathy, CHF and CKD 3.   All these conditions can increase fatigue, sleepiness and hypoventilation. For this reason I will refer and attended sleep study for the patient with CO2 monitoring.  She also has some struggles at night to fall asleep and stay asleep and not to wake up too early. We have discussed some sleep hygiene measurements, and I will also give her a brochure about insomnia and sleep hygiene. Better sleep habits may increase her ability to sleep for an hour or more without interruption.   Plan:  Treatment plan and additional workup :   Sleep study evaluation, need to confirm current degree of apnea.  Likely will need CPAP given previous REM dependent apnea.   If CPAP intolerant may consider Inspire device- but she  needs to lose weight to be a candidate ( BMI is currently set at 32 ).  Nasal passage is congested at this time, she has already a prescription for flonase and needs to start a netti pot to allow better airflow. .    I thank Dr. Kirtland Bouchard for the referral.    Larey Seat MD  09/18/2018   CC: Dr. Kirtland Bouchard, Caleb Popp, DO @ Heart failure clinic at [facility].    Biagio Borg, Md [place], [place] [address]  Medical screening examination/treatment/procedure(s)  were performed by non-physician practitioner and as supervising physician I was immediately available for consultation/collaboration. I agree with above. Cathlean Cower, MD

## 2018-09-20 ENCOUNTER — Encounter: Payer: Self-pay | Admitting: Neurology

## 2018-09-24 ENCOUNTER — Ambulatory Visit (INDEPENDENT_AMBULATORY_CARE_PROVIDER_SITE_OTHER): Payer: Managed Care, Other (non HMO) | Admitting: Bariatrics

## 2018-09-24 ENCOUNTER — Other Ambulatory Visit (INDEPENDENT_AMBULATORY_CARE_PROVIDER_SITE_OTHER): Payer: Self-pay | Admitting: Bariatrics

## 2018-09-24 ENCOUNTER — Encounter (INDEPENDENT_AMBULATORY_CARE_PROVIDER_SITE_OTHER): Payer: Self-pay | Admitting: Bariatrics

## 2018-09-24 VITALS — BP 108/73 | HR 73 | Temp 97.7°F | Ht 64.0 in | Wt 234.0 lb

## 2018-09-24 DIAGNOSIS — E559 Vitamin D deficiency, unspecified: Secondary | ICD-10-CM | POA: Diagnosis not present

## 2018-09-24 DIAGNOSIS — E038 Other specified hypothyroidism: Secondary | ICD-10-CM

## 2018-09-24 DIAGNOSIS — R7303 Prediabetes: Secondary | ICD-10-CM | POA: Diagnosis not present

## 2018-09-24 DIAGNOSIS — F3289 Other specified depressive episodes: Secondary | ICD-10-CM

## 2018-09-24 DIAGNOSIS — Z9189 Other specified personal risk factors, not elsewhere classified: Secondary | ICD-10-CM

## 2018-09-24 DIAGNOSIS — M2550 Pain in unspecified joint: Secondary | ICD-10-CM | POA: Diagnosis not present

## 2018-09-24 DIAGNOSIS — Z6841 Body Mass Index (BMI) 40.0 and over, adult: Secondary | ICD-10-CM

## 2018-09-24 NOTE — Progress Notes (Signed)
Office: [phone]  /  Fax: [phone]   HPI:   Chief Complaint: OBESITY Ethelreda is here to discuss her progress with her obesity treatment plan. She is on the Category 3 plan and is following her eating plan approximately 100 % of the time. She states she is exercising 0 minutes 0 times per week. Judia has been doing well with the diet (planning and slower eating). Jean has stuck to the plan. Her weight is 234 lb (106.1 kg) today and has had a weight loss of 3 pounds over a period of 2 weeks since her last visit. She has lost 7 lbs since starting treatment with [place].  Vitamin D deficiency Saidah has a diagnosis of vitamin D deficiency. She is currently taking OTC vit D and denies nausea, vomiting or muscle weakness.  At risk for osteopenia and osteoporosis Tomie is at higher risk of osteopenia and osteoporosis due to vitamin D deficiency.   Pre-Diabetes Syleena has a diagnosis of prediabetes based on her elevated Hgb A1c of 5.6 and insulin level of 17.4 and was informed this puts her at greater risk of developing diabetes. She is not taking medications currently and continues to work on diet and exercise to decrease risk of diabetes. She denies nausea or hypoglycemia.  Pain in feet, ankles and other joints (joint pain) Caryssa is currently taking OTC Blue Emu and OTC tylenol.  Low TSH, Hypothyroidism Donice has a diagnosis of low TSH and hypothyroidism. She is on levothyroxine. She denies hot or cold intolerance or palpitations.  Depression with emotional eating behaviors Neelam is struggling with emotional eating and using food for comfort to the extent that it is negatively impacting her health. She often snacks when she is not hungry. Ketzaly sometimes feels she is out of control and then feels guilty that she made poor food choices. She has been working on behavior modification techniques to help reduce her emotional eating and has been somewhat successful.  She is currently taking Topamax. She shows no sign of suicidal or homicidal ideations.  Depression screen [facility] 2/9 08/09/2018 05/16/2018 09/27/2017 06/14/2017 02/16/2016  Decreased Interest 2 0 0 0 0  Down, Depressed, Hopeless 1 1 0 0 0  PHQ - 2 Score 3 1 0 0 0  Altered sleeping 2 - - - -  Tired, decreased energy 3 - - - -  Change in appetite 1 - - - -  Feeling bad or failure about yourself  1 - - - -  Trouble concentrating 0 - - - -  Moving slowly or fidgety/restless 2 - - - -  Suicidal thoughts 0 - - - -  PHQ-9 Score 12 - - - -  Difficult doing work/chores Somewhat difficult - - - -  Some recent data might be hidden     ASSESSMENT AND PLAN:  Vitamin D deficiency - Plan: VITAMIN D 25 Hydroxy (Vit-D Deficiency, Fractures)  Prediabetes - Plan: Comprehensive metabolic panel  Arthralgia, unspecified joint - Plan: Ambulatory referral to Rheumatology  Other specified hypothyroidism - Plan: T3, T4, free, TSH  Other depression - with emotional eating  At risk for osteoporosis  Class 3 severe obesity with serious comorbidity and body mass index (BMI) of 40.0 to 44.9 in adult, unspecified obesity type (HCC)  PLAN:  Vitamin D Deficiency Evadne was informed that low vitamin D levels contributes to fatigue and are associated with obesity, breast, and colon cancer. She will continue to take OTC vitamin D and will follow up for routine testing  of vitamin D, at least 2-3 times per year. She was informed of the risk of over-replacement of vitamin D and agrees to not increase her dose unless she discusses this with [place] first. We will check vitamin D level today.  At risk for osteopenia and osteoporosis Takeshia was given extended  (15 minutes) osteoporosis prevention counseling today. Antoninette is at risk for osteopenia and osteoporosis due to her vitamin D deficiency. She was encouraged to take her vitamin D and follow her higher calcium diet and increase strengthening exercise to help strengthen  her bones and decrease her risk of osteopenia and osteoporosis.  Pre-Diabetes Treniyah will continue to work on weight loss, exercise, increasing protein and decreasing simple carbohydrates in her diet to help decrease the risk of diabetes. She was informed that eating too many simple carbohydrates or too many calories at one sitting increases the likelihood of GI side effects. Jamyra agreed to follow up with [place] as directed to monitor her progress.  Pain in feet, ankles and other joints (joint pain) We will refer Kaili to a rheumatologist. She agrees to follow up with our clinic in 2 weeks.  Low TSH, Hypothyroidism [name] was informed of the importance of good thyroid control to help with weight loss efforts. She was also informed that supertheraputic thyroid levels are dangerous and will not improve weight loss results. We will check TSH, T3 and T4 and Sylvana will follow up with her PCP.  Depression with Emotional Eating Behaviors We discussed behavior modification techniques today to help Laniyah deal with her emotional eating and depression. She will continue Topamax and follow up as directed.  Obesity Paolina is currently in the action stage of change. As such, her goal is to continue with weight loss efforts She has agreed to follow the Category 3 plan Cherolyn has been instructed to work up to a goal of 150 minutes of combined cardio and strengthening exercise per week for weight loss and overall health benefits. We discussed the following Behavioral Modification Strategies today: increase H2O intake, increasing lean protein intake, decreasing simple carbohydrates, increasing vegetables and work on meal planning and easy cooking plans  Alexandrya has agreed to follow up with our clinic in 2 weeks. She was informed of the importance of frequent follow up visits to maximize her success with intensive lifestyle modifications for her multiple health  conditions.  ALLERGIES: Allergies  Allergen Reactions  . Ivp Dye [Iodinated Diagnostic Agents] Shortness Of Breath and Other (See Comments)    Swelling of extremity of the injection site  . Latex Itching    ITCHING AND COLD SORES AROUND MOUTH WHEN LATEX GLOVES USED BY THE DENTIST/HYGENTIST  . Clarithromycin Nausea And Vomiting    REACTION: nausea  vomiting  . Iodine   . Lisinopril   . Metronidazole Nausea And Vomiting  . Shellfish Allergy   . Zostavax [Zoster Vaccine Live]     MEDICATIONS: Current Outpatient Medications on File Prior to Visit  Medication Sig Dispense Refill  . albuterol (PROVENTIL HFA;VENTOLIN HFA) 108 (90 Base) MCG/ACT inhaler Inhale 2 puffs into the lungs every 4 (four) hours as needed for wheezing or shortness of breath. 1 Inhaler 3  . aspirin 81 MG EC tablet Take 81 mg by mouth daily.      . budesonide-formoterol (SYMBICORT) 160-4.5 MCG/ACT inhaler INHALE 2 PUFFS INTO THE LUNGS TWICE DAILY 10.2 g 6  . Cholecalciferol (VITAMIN D) 50 MCG (2000 UT) CAPS Take by mouth.    . fexofenadine (ALLEGRA) 180 MG tablet  Take 180 mg by mouth daily.      . fluticasone (FLONASE) 50 MCG/ACT nasal spray Place 2 sprays into both nostrils daily. 16 g 6  . gabapentin (NEURONTIN) 300 MG capsule TAKE 1 CAPSULE BY MOUTH THREE TIMES A DAY 90 capsule 1  . KLOR-CON M20 20 MEQ tablet TAKE 2 TABLETS (40 MEQ TOTAL) BY MOUTH DAILY  3  . levothyroxine (SYNTHROID, LEVOTHROID) 100 MCG tablet Take 1 tablet (100 mcg total) by mouth daily before breakfast. 90 tablet 3  . losartan (COZAAR) 25 MG tablet Take 25 mg by mouth daily.    . Multiple Vitamin (MULTIVITAMIN) capsule Take 1 capsule by mouth daily.      [name] omeprazole (PRILOSEC) 20 MG capsule Take 20 mg by mouth as needed.    . rosuvastatin (CRESTOR) 10 MG tablet TAKE ONE TABLET BY MOUTH DAILY 180 tablet 1  . topiramate (TOPAMAX) 50 MG tablet Take 1 tablet (50 mg total) by mouth daily. 30 tablet 0  . torsemide (DEMADEX) 20 MG tablet 40mg  in the  morning and 40mg  evening time.    . valACYclovir (VALTREX) 1000 MG tablet Take 500 mg by mouth daily.  0   No current facility-administered medications on file prior to visit.     PAST MEDICAL HISTORY: Past Medical History:  Diagnosis Date  . ALLERGIC RHINITIS 05/09/2008  . Allergy   . ANXIETY 05/23/2007  . ARM PAIN, LEFT 10/23/2009  . Arthritis   . Asthma   . ASTHMA 05/23/2007  . Back pain   . Cardiomyopathy ([place]) 09/08/2015  . Chest pain   . CHF (congestive heart failure) ([place])   . Constipation   . CONTACT DERMATITIS 06/09/2009  . Diabetes mellitus   . ENDOMETRIOSIS NOS 05/23/2007  . FACIAL PAIN 06/02/2010  . Food allergy   . FREQUENCY, URINARY 05/17/2010  . GLUCOSE INTOLERANCE 08/28/2009  . HERPES SIMPLEX, UNCOMPLICATED 0/25/4270  . Hyperlipidemia 08/28/2015  . HYPERTHYROIDISM 05/23/2007  . Hypoactive thyroid   . HYPOTHYROIDISM 05/09/2008  . Impaired glucose tolerance 01/28/2011  . INSOMNIA, HX OF 05/23/2007  . Joint pain   . LATERAL EPICONDYLITIS, RIGHT 03/10/2009  . LIPOMA 10/22/2010  . NECK PAIN 11/10/2010  . OBESITY 05/23/2007  . Plantar fasciitis    Both feet  . SHOULDER PAIN, LEFT 05/17/2010  . SINUSITIS- ACUTE-NOS 11/03/2008  . SKIN LESION 11/10/2010  . Sleep apnea   . Stage III chronic kidney disease ([place])   . Unspecified Chest Pain 07/25/2008  . UNSPECIFIED URTICARIA 06/04/2009  . URI 08/28/2009  . UTI 04/29/2008  . VITAMIN D DEFICIENCY 08/28/2009  . Wheezing 07/12/2010    PAST SURGICAL HISTORY: Past Surgical History:  Procedure Laterality Date  . COLONOSCOPY    . ENDOMETRIAL ABLATION    . LAMINECTOMY  1996  . LAPAROTOMY     exploratory  . lower back surgery    . POLYPECTOMY    . s/p endometrial ablation  12/06  . TUBAL LIGATION    . UTERINE FIBROID SURGERY    . WISDOM TOOTH EXTRACTION      SOCIAL HISTORY: Social History   Tobacco Use  . Smoking status: Former Smoker    Packs/day: 0.50    Types: Cigarettes    Last attempt to quit: 11/05/1987    Years  since quitting: 30.9  . Smokeless tobacco: Never Used  Substance Use Topics  . Alcohol use: No    Alcohol/week: 1.0 standard drinks    Types: 1 Glasses of wine per week  Comment: WINE  . Drug use: No    FAMILY HISTORY: Family History  Problem Relation Age of Onset  . Diabetes Cousin   . Hypertension Cousin   . Heart disease Cousin   . Heart disease Mother        Rheumatic heart disease  . Depression Mother   . COPD Father   . Hypertension Father   . Cancer Maternal Grandmother        Breast  . Diabetes Sister   . Kidney disease Sister     ROS: Review of Systems  Constitutional: Positive for weight loss.  Cardiovascular: Negative for palpitations.  Gastrointestinal: Negative for nausea and vomiting.  Musculoskeletal: Positive for joint pain.       Negative for muscle weakness  Endo/Heme/Allergies:       Negative for hypoglycemia Negative for hot or cold intolerance  Psychiatric/Behavioral: Positive for depression. Negative for suicidal ideas.    PHYSICAL EXAM: Blood pressure 108/73, pulse 73, temperature 97.7 F (36.5 C), temperature source Oral, height 5\' 4"  (1.626 m), weight 234 lb (106.1 kg), SpO2 98 %. Body mass index is 40.17 kg/m. Physical Exam Vitals signs reviewed.  Constitutional:      Appearance: Normal appearance. She is well-developed. She is obese.  Cardiovascular:     Rate and Rhythm: Normal rate.  Pulmonary:     Effort: Pulmonary effort is normal.  Musculoskeletal: Normal range of motion.  Skin:    General: Skin is warm and dry.  Neurological:     Mental Status: She is alert and oriented to person, place, and time.  Psychiatric:        Mood and Affect: Mood normal.        Behavior: Behavior normal.        Thought Content: Thought content does not include homicidal or suicidal ideation.     RECENT LABS AND TESTS: BMET    Component Value Date/Time   NA 140 05/16/2018 0924   K 4.1 05/16/2018 0924   CL 102 05/16/2018 0924   CO2 30  05/16/2018 0924   GLUCOSE 106 (H) 05/16/2018 0924   BUN 31 (H) 05/16/2018 0924   CREATININE 1.99 (H) 05/16/2018 0924   CREATININE 0.97 09/17/2015 1113   CALCIUM 10.0 05/16/2018 0924   GFRNONAA 48 (L) 11/18/2016 1554   GFRAA 55 (L) 11/18/2016 1554   Lab Results  Component Value Date   HGBA1C 5.6 08/09/2018   HGBA1C 5.7 (A) 05/04/2018   HGBA1C 6.0 09/27/2017   HGBA1C 5.9 03/21/2017   HGBA1C 5.6 02/16/2016   Lab Results  Component Value Date   INSULIN 17.9 08/09/2018   CBC    Component Value Date/Time   WBC 6.8 05/16/2018 0924   RBC 3.94 05/16/2018 0924   HGB 12.2 05/16/2018 0924   HCT 36.3 05/16/2018 0924   PLT 276.0 05/16/2018 0924   MCV 92.3 05/16/2018 0924   MCH 31.4 11/18/2016 1554   MCHC 33.5 05/16/2018 0924   RDW 13.3 05/16/2018 0924   LYMPHSABS 2.5 05/16/2018 0924   MONOABS 0.6 05/16/2018 0924   EOSABS 0.1 05/16/2018 0924   BASOSABS 0.1 05/16/2018 0924   Iron/TIBC/Ferritin/ %Sat    Component Value Date/Time   IRON 55 03/21/2017 0935   Lipid Panel     Component Value Date/Time   CHOL 144 05/16/2018 0924   TRIG 66.0 05/16/2018 0924   HDL 81.10 05/16/2018 0924   CHOLHDL 2 05/16/2018 0924   VLDL 13.2 05/16/2018 0924   LDLCALC 50 05/16/2018 2951  Hepatic Function Panel     Component Value Date/Time   PROT 7.1 05/16/2018 0924   ALBUMIN 4.1 05/16/2018 0924   AST 14 05/16/2018 0924   ALT 19 05/16/2018 0924   ALKPHOS 72 05/16/2018 0924   BILITOT 0.5 05/16/2018 0924   BILIDIR 0.1 05/16/2018 0924      Component Value Date/Time   TSH 0.132 (L) 08/09/2018 1003   TSH 0.41 05/16/2018 0924   TSH 0.75 12/25/2017 1026    Ref. Range 08/02/2010 20:09  Vitamin D, 25-Hydroxy Latest Ref Range: 30 - 89 ng/mL 37     OBESITY BEHAVIORAL INTERVENTION VISIT  Today's visit was # 4   Starting weight: 241 lbs Starting date: 08/09/2018 Today's weight : 234 lbs  Today's date: 09/24/2018 Total lbs lost to date: 7   ASK: We discussed the diagnosis of obesity  with Mamie Laurel today and Atina agreed to give [place] permission to discuss obesity behavioral modification therapy today.  ASSESS: Dava has the diagnosis of obesity and her BMI today is 40.15 Lekia is in the action stage of change   ADVISE: Milania was educated on the multiple health risks of obesity as well as the benefit of weight loss to improve her health. She was advised of the need for long term treatment and the importance of lifestyle modifications to improve her current health and to decrease her risk of future health problems.  AGREE: Multiple dietary modification options and treatment options were discussed and  Dannette agreed to follow the recommendations documented in the above note.  ARRANGE: Karleigh was educated on the importance of frequent visits to treat obesity as outlined per CMS and USPSTF guidelines and agreed to schedule her next follow up appointment today.  Corey Skains, am acting as Location manager for [employer]. Owens Shark, DO  I have reviewed the above documentation for accuracy and completeness, and I agree with the above. -Jearld Lesch, DO

## 2018-09-25 ENCOUNTER — Other Ambulatory Visit: Payer: Self-pay | Admitting: Neurology

## 2018-09-25 ENCOUNTER — Telehealth: Payer: Self-pay | Admitting: Neurology

## 2018-09-25 ENCOUNTER — Telehealth: Payer: Self-pay

## 2018-09-25 DIAGNOSIS — N183 Chronic kidney disease, stage 3 unspecified: Secondary | ICD-10-CM

## 2018-09-25 DIAGNOSIS — E038 Other specified hypothyroidism: Secondary | ICD-10-CM

## 2018-09-25 DIAGNOSIS — G4730 Sleep apnea, unspecified: Secondary | ICD-10-CM

## 2018-09-25 DIAGNOSIS — I5020 Unspecified systolic (congestive) heart failure: Secondary | ICD-10-CM

## 2018-09-25 LAB — COMPREHENSIVE METABOLIC PANEL
ALT: 17 IU/L (ref 0–32)
AST: 18 IU/L (ref 0–40)
Albumin/Globulin Ratio: 1.9 (ref 1.2–2.2)
Albumin: 4.4 g/dL (ref 3.6–4.8)
Alkaline Phosphatase: 101 IU/L (ref 39–117)
BUN/Creatinine Ratio: 17 (ref 12–28)
BUN: 24 mg/dL (ref 8–27)
Bilirubin Total: 0.5 mg/dL (ref 0.0–1.2)
CALCIUM: 10.1 mg/dL (ref 8.7–10.3)
CHLORIDE: 101 mmol/L (ref 96–106)
CO2: 21 mmol/L (ref 20–29)
Creatinine, Ser: 1.45 mg/dL — ABNORMAL HIGH (ref 0.57–1.00)
GFR, EST AFRICAN AMERICAN: 45 mL/min/{1.73_m2} — AB (ref >59)
GFR, EST NON AFRICAN AMERICAN: 39 mL/min/{1.73_m2} — AB (ref >59)
Globulin, Total: 2.3 g/dL (ref 1.5–4.5)
Glucose: 93 mg/dL (ref 65–99)
POTASSIUM: 4 mmol/L (ref 3.5–5.2)
Sodium: 140 mmol/L (ref 134–144)
Total Protein: 6.7 g/dL (ref 6.0–8.5)

## 2018-09-25 LAB — TSH: TSH: 2.8 u[IU]/mL (ref 0.450–4.500)

## 2018-09-25 LAB — T3: T3 TOTAL: 104 ng/dL (ref 71–180)

## 2018-09-25 LAB — T4, FREE: Free T4: 1.54 ng/dL (ref 0.82–1.77)

## 2018-09-25 LAB — VITAMIN D 25 HYDROXY (VIT D DEFICIENCY, FRACTURES): Vit D, 25-Hydroxy: 35.7 ng/mL (ref 30.0–100.0)

## 2018-09-25 NOTE — Telephone Encounter (Signed)
Home sleep test ordered placed.

## 2018-09-25 NOTE — Telephone Encounter (Signed)
I did not delete this part of the template form my note as would have been necessary. I will make the adjustments immediately.   Larey Seat, MD

## 2018-09-25 NOTE — Telephone Encounter (Signed)
[employer] denied in lab sleep study despite cardiac diagnosis. Need HST order

## 2018-09-27 ENCOUNTER — Encounter: Payer: Managed Care, Other (non HMO) | Admitting: Internal Medicine

## 2018-09-27 ENCOUNTER — Telehealth (INDEPENDENT_AMBULATORY_CARE_PROVIDER_SITE_OTHER): Payer: Self-pay | Admitting: Bariatrics

## 2018-09-27 NOTE — Telephone Encounter (Signed)
Patient states she thinks she is having side effects from the Topamax medication.  She states she has been suffering from joint pain, tingling & numbness in left arm, and headaches.  She did not take the medication last night.  Please advise. Thank you.

## 2018-09-27 NOTE — Telephone Encounter (Signed)
Spoke with the patient and informed to discontinue the medication at this time and will discuss at her 10/11/18 visit. Patient verbalized understanding. Jonael Paradiso, [place]

## 2018-09-28 ENCOUNTER — Encounter: Payer: Self-pay | Admitting: Internal Medicine

## 2018-09-28 ENCOUNTER — Other Ambulatory Visit (INDEPENDENT_AMBULATORY_CARE_PROVIDER_SITE_OTHER): Payer: Managed Care, Other (non HMO)

## 2018-09-28 ENCOUNTER — Ambulatory Visit (INDEPENDENT_AMBULATORY_CARE_PROVIDER_SITE_OTHER): Payer: Managed Care, Other (non HMO) | Admitting: Internal Medicine

## 2018-09-28 ENCOUNTER — Encounter: Payer: Managed Care, Other (non HMO) | Admitting: Internal Medicine

## 2018-09-28 VITALS — BP 122/72 | Temp 98.0°F | Ht 64.0 in | Wt 240.0 lb

## 2018-09-28 DIAGNOSIS — N183 Chronic kidney disease, stage 3 unspecified: Secondary | ICD-10-CM

## 2018-09-28 DIAGNOSIS — R739 Hyperglycemia, unspecified: Secondary | ICD-10-CM | POA: Diagnosis not present

## 2018-09-28 DIAGNOSIS — M2550 Pain in unspecified joint: Secondary | ICD-10-CM | POA: Insufficient documentation

## 2018-09-28 DIAGNOSIS — R7303 Prediabetes: Secondary | ICD-10-CM | POA: Diagnosis not present

## 2018-09-28 DIAGNOSIS — E790 Hyperuricemia without signs of inflammatory arthritis and tophaceous disease: Secondary | ICD-10-CM | POA: Diagnosis not present

## 2018-09-28 DIAGNOSIS — M8580 Other specified disorders of bone density and structure, unspecified site: Secondary | ICD-10-CM | POA: Diagnosis not present

## 2018-09-28 DIAGNOSIS — Z0000 Encounter for general adult medical examination without abnormal findings: Secondary | ICD-10-CM

## 2018-09-28 HISTORY — DX: Other specified disorders of bone density and structure, unspecified site: M85.80

## 2018-09-28 LAB — LIPID PANEL
CHOL/HDL RATIO: 2
Cholesterol: 131 mg/dL (ref 0–200)
HDL: 77.7 mg/dL (ref >39.00)
LDL Cholesterol: 37 mg/dL (ref 0–99)
NONHDL: 53.66
Triglycerides: 84 mg/dL (ref 0.0–149.0)
VLDL: 16.8 mg/dL (ref 0.0–40.0)

## 2018-09-28 LAB — HEPATIC FUNCTION PANEL
ALT: 17 U/L (ref 0–35)
AST: 17 U/L (ref 0–37)
Albumin: 4.7 g/dL (ref 3.5–5.2)
Alkaline Phosphatase: 103 U/L (ref 39–117)
Bilirubin, Direct: 0.2 mg/dL (ref 0.0–0.3)
Total Bilirubin: 0.4 mg/dL (ref 0.2–1.2)
Total Protein: 7.9 g/dL (ref 6.0–8.3)

## 2018-09-28 LAB — URINALYSIS, ROUTINE W REFLEX MICROSCOPIC
BILIRUBIN URINE: NEGATIVE
Hgb urine dipstick: NEGATIVE
Ketones, ur: NEGATIVE
Leukocytes, UA: NEGATIVE
Nitrite: NEGATIVE
RBC / HPF: NONE SEEN (ref 0–?)
Specific Gravity, Urine: 1.01 (ref 1.000–1.030)
Total Protein, Urine: NEGATIVE
Urine Glucose: NEGATIVE
Urobilinogen, UA: 0.2 (ref 0.0–1.0)
WBC UA: NONE SEEN (ref 0–?)
pH: 7 (ref 5.0–8.0)

## 2018-09-28 LAB — BASIC METABOLIC PANEL
BUN: 27 mg/dL — ABNORMAL HIGH (ref 6–23)
CO2: 30 mEq/L (ref 19–32)
Calcium: 10.3 mg/dL (ref 8.4–10.5)
Chloride: 102 mEq/L (ref 96–112)
Creatinine, Ser: 1.44 mg/dL — ABNORMAL HIGH (ref 0.40–1.20)
GFR: 47.52 mL/min — ABNORMAL LOW (ref >60.00)
GLUCOSE: 100 mg/dL — AB (ref 70–99)
Potassium: 4.1 mEq/L (ref 3.5–5.1)
Sodium: 141 mEq/L (ref 135–145)

## 2018-09-28 LAB — HEMOGLOBIN A1C: Hgb A1c MFr Bld: 5.6 % (ref 4.6–6.5)

## 2018-09-28 LAB — CBC WITH DIFFERENTIAL/PLATELET
Basophils Absolute: 0.1 10*3/uL (ref 0.0–0.1)
Basophils Relative: 0.9 % (ref 0.0–3.0)
EOS ABS: 0.1 10*3/uL (ref 0.0–0.7)
Eosinophils Relative: 1.5 % (ref 0.0–5.0)
HCT: 39.4 % (ref 36.0–46.0)
Hemoglobin: 13.3 g/dL (ref 12.0–15.0)
Lymphocytes Relative: 30.7 % (ref 12.0–46.0)
Lymphs Abs: 2.7 10*3/uL (ref 0.7–4.0)
MCHC: 33.9 g/dL (ref 30.0–36.0)
MCV: 87.6 fl (ref 78.0–100.0)
Monocytes Absolute: 0.8 10*3/uL (ref 0.1–1.0)
Monocytes Relative: 9.2 % (ref 3.0–12.0)
Neutro Abs: 5.1 10*3/uL (ref 1.4–7.7)
Neutrophils Relative %: 57.7 % (ref 43.0–77.0)
Platelets: 282 10*3/uL (ref 150.0–400.0)
RBC: 4.5 Mil/uL (ref 3.87–5.11)
RDW: 13.2 % (ref 11.5–15.5)
WBC: 8.9 10*3/uL (ref 4.0–10.5)

## 2018-09-28 LAB — VITAMIN D 25 HYDROXY (VIT D DEFICIENCY, FRACTURES): VITD: 46.3 ng/mL (ref 30.00–100.00)

## 2018-09-28 LAB — TSH: TSH: 3.29 u[IU]/mL (ref 0.35–4.50)

## 2018-09-28 LAB — MICROALBUMIN / CREATININE URINE RATIO
CREATININE, U: 14.7 mg/dL
Microalb Creat Ratio: 4.8 mg/g (ref 0.0–30.0)
Microalb, Ur: <0.7 mg/dL (ref 0.0–1.9)

## 2018-09-28 LAB — URIC ACID: Uric Acid, Serum: 9.4 mg/dL — ABNORMAL HIGH (ref 2.4–7.0)

## 2018-09-28 NOTE — Assessment & Plan Note (Signed)
Also for vit d level 

## 2018-09-28 NOTE — Assessment & Plan Note (Signed)

## 2018-09-28 NOTE — Patient Instructions (Signed)
Please continue all other medications as before, and refills have been done if requested.  Please have the pharmacy call with any other refills you may need.  Please continue your efforts at being more active, low cholesterol diet, and weight control.  You are otherwise up to date with prevention measures today.  Please keep your appointments with your specialists as you may have planned, such as for the sleep apnea testing  You will be contacted regarding the referral for: Rheumatology  Please go to the LAB in the Basement (turn left off the elevator) for the tests to be done today  You will be contacted by phone if any changes need to be made immediately.  Otherwise, you will receive a letter about your results with an explanation, but please check with MyChart first.  Please remember to sign up for MyChart if you have not done so, as this will be important to you in the future with finding out test results, communicating by private email, and scheduling acute appointments online when needed.  Please return in 6 months, or sooner if needed

## 2018-09-28 NOTE — Assessment & Plan Note (Signed)
stable overall by history and exam, recent data reviewed with pt, and pt to continue medical treatment as before,  to f/u any worsening symptoms or concerns  

## 2018-09-28 NOTE — Assessment & Plan Note (Signed)
For rheum referral 

## 2018-09-28 NOTE — Assessment & Plan Note (Signed)
For f/u uric acid

## 2018-09-28 NOTE — Progress Notes (Signed)
Subjective:    Patient ID: Suzanne Hansen, female    DOB: June 08, 1956, 62 y.o.   MRN: [id number]  HPI  Here for wellness and f/u;  Overall doing ok;  Pt denies Chest pain, worsening SOB, DOE, wheezing, orthopnea, PND, worsening LE edema, palpitations, dizziness or syncope.  Pt denies neurological change such as new headache, facial or extremity weakness.  Pt denies polydipsia, polyuria, or low sugar symptoms. Pt states overall good compliance with treatment and medications, good tolerability, and has been trying to follow appropriate diet.  Pt denies worsening depressive symptoms, suicidal ideation or panic. No fever, night sweats, wt loss, loss of appetite, or other constitutional symptoms.  Pt states good ability with ADL's, has low fall risk, home safety reviewed and adequate, no other significant changes in hearing or vision, and only occasionally active with exercise, due to ongoing bilat knee pain and lbp, asks for rheum referral, has seen sports medicine.  No other new complaints Now wt down from 249 peak wt.   Wt Readings from Last 3 Encounters:  09/28/18 240 lb (108.9 kg)  09/24/18 234 lb (106.1 kg)  09/18/18 241 lb (109.3 kg)   BP Readings from Last 3 Encounters:  09/28/18 122/72  09/24/18 108/73  09/18/18 124/83   Past Medical History:  Diagnosis Date  . ALLERGIC RHINITIS 05/09/2008  . Allergy   . ANXIETY 05/23/2007  . ARM PAIN, LEFT 10/23/2009  . Arthritis   . Asthma   . ASTHMA 05/23/2007  . Back pain   . Cardiomyopathy ([place]) 09/08/2015  . Chest pain   . CHF (congestive heart failure) ([place])   . Constipation   . CONTACT DERMATITIS 06/09/2009  . Diabetes mellitus   . ENDOMETRIOSIS NOS 05/23/2007  . FACIAL PAIN 06/02/2010  . Food allergy   . FREQUENCY, URINARY 05/17/2010  . GLUCOSE INTOLERANCE 08/28/2009  . HERPES SIMPLEX, UNCOMPLICATED 06/30/1940  . Hyperlipidemia 08/28/2015  . HYPERTHYROIDISM 05/23/2007  . Hypoactive thyroid   . HYPOTHYROIDISM 05/09/2008  . Impaired glucose  tolerance 01/28/2011  . INSOMNIA, HX OF 05/23/2007  . Joint pain   . LATERAL EPICONDYLITIS, RIGHT 03/10/2009  . LIPOMA 10/22/2010  . NECK PAIN 11/10/2010  . OBESITY 05/23/2007  . Osteopenia 09/28/2018  . Plantar fasciitis    Both feet  . SHOULDER PAIN, LEFT 05/17/2010  . SINUSITIS- ACUTE-NOS 11/03/2008  . SKIN LESION 11/10/2010  . Sleep apnea   . Stage III chronic kidney disease ([place])   . Unspecified Chest Pain 07/25/2008  . UNSPECIFIED URTICARIA 06/04/2009  . URI 08/28/2009  . UTI 04/29/2008  . VITAMIN D DEFICIENCY 08/28/2009  . Wheezing 07/12/2010   Past Surgical History:  Procedure Laterality Date  . COLONOSCOPY    . ENDOMETRIAL ABLATION    . LAMINECTOMY  1996  . LAPAROTOMY     exploratory  . lower back surgery    . POLYPECTOMY    . s/p endometrial ablation  12/06  . TUBAL LIGATION    . UTERINE FIBROID SURGERY    . WISDOM TOOTH EXTRACTION      reports that she quit smoking about 30 years ago. Her smoking use included cigarettes. She smoked 0.50 packs per day. She has never used smokeless tobacco. She reports that she does not drink alcohol or use drugs. family history includes COPD in her father; Cancer in her maternal grandmother; Depression in her mother; Diabetes in her cousin and sister; Heart disease in her cousin and mother; Hypertension in her cousin and father; Kidney disease in her  sister. Allergies  Allergen Reactions  . Ivp Dye [Iodinated Diagnostic Agents] Shortness Of Breath and Other (See Comments)    Swelling of extremity of the injection site  . Latex Itching    ITCHING AND COLD SORES AROUND MOUTH WHEN LATEX GLOVES USED BY THE DENTIST/HYGENTIST  . Clarithromycin Nausea And Vomiting    REACTION: nausea  vomiting  . Iodine   . Lisinopril   . Metronidazole Nausea And Vomiting  . Shellfish Allergy   . Zostavax [Zoster Vaccine Live]    Current Outpatient Medications on File Prior to Visit  Medication Sig Dispense Refill  . albuterol (PROVENTIL HFA;VENTOLIN HFA) 108  (90 Base) MCG/ACT inhaler Inhale 2 puffs into the lungs every 4 (four) hours as needed for wheezing or shortness of breath. 1 Inhaler 3  . aspirin 81 MG EC tablet Take 81 mg by mouth daily.      . budesonide-formoterol (SYMBICORT) 160-4.5 MCG/ACT inhaler INHALE 2 PUFFS INTO THE LUNGS TWICE DAILY 10.2 g 6  . Cholecalciferol (VITAMIN D) 50 MCG (2000 UT) CAPS Take by mouth.    . fexofenadine (ALLEGRA) 180 MG tablet Take 180 mg by mouth daily.      . fluticasone (FLONASE) 50 MCG/ACT nasal spray Place 2 sprays into both nostrils daily. 16 g 6  . gabapentin (NEURONTIN) 300 MG capsule TAKE 1 CAPSULE BY MOUTH THREE TIMES A DAY 90 capsule 1  . KLOR-CON M20 20 MEQ tablet TAKE 2 TABLETS (40 MEQ TOTAL) BY MOUTH DAILY  3  . levothyroxine (SYNTHROID, LEVOTHROID) 100 MCG tablet Take 1 tablet (100 mcg total) by mouth daily before breakfast. 90 tablet 3  . losartan (COZAAR) 25 MG tablet Take 25 mg by mouth daily.    . Multiple Vitamin (MULTIVITAMIN) capsule Take 1 capsule by mouth daily.      [name] omeprazole (PRILOSEC) 20 MG capsule Take 20 mg by mouth as needed.    . rosuvastatin (CRESTOR) 10 MG tablet TAKE ONE TABLET BY MOUTH DAILY 180 tablet 1  . torsemide (DEMADEX) 20 MG tablet 40mg  in the morning and 40mg  evening time.    . valACYclovir (VALTREX) 1000 MG tablet Take 500 mg by mouth daily.  0   No current facility-administered medications on file prior to visit.    Review of Systems  Constitutional: Negative for other unusual diaphoresis or sweats HENT: Negative for ear discharge or swelling Eyes: Negative for other worsening visual disturbances Respiratory: Negative for stridor or other swelling  Gastrointestinal: Negative for worsening distension or other blood Genitourinary: Negative for retention or other urinary change Musculoskeletal: Negative for other MSK pain or swelling Skin: Negative for color change or other new lesions Neurological: Negative for worsening tremors and other numbness    Psychiatric/Behavioral: Negative for worsening agitation or other fatigue All other system neg per pt    Objective:   Physical Exam BP 122/72   Temp 98 F (36.7 C) (Oral)   Ht 5\' 4"  (1.626 m)   Wt 240 lb (108.9 kg)   BMI 41.20 kg/m  VS noted,  Constitutional: Pt is oriented to person, place, and time. Appears well-developed and well-nourished, in no significant distress and comfortable Head: Normocephalic and atraumatic  Eyes: Conjunctivae and EOM are normal. Pupils are equal, round, and reactive to light Right Ear: External ear normal without discharge Left Ear: External ear normal without discharge Nose: Nose without discharge or deformity Mouth/Throat: Oropharynx is without other ulcerations and moist  Neck: Normal range of motion. Neck supple. No JVD present. No  tracheal deviation present or significant neck LA or mass Cardiovascular: Normal rate, regular rhythm, normal heart sounds and intact distal pulses.   Pulmonary/Chest: WOB normal and breath sounds without rales or wheezing  Abdominal: Soft. Bowel sounds are normal. NT. No HSM  Musculoskeletal: Normal range of motion. Lymphadenopathy: Has no other cervical adenopathy.  Neurological: Pt is alert and oriented to person, place, and time. Pt has normal reflexes. No cranial nerve deficit. Motor grossly intact, Gait intact Skin: Skin is warm and dry. No rash noted or new ulcerations Psychiatric:  Has normal mood and affect. Behavior is normal without agitation Trace bilat pedal edema  Lab Results  Component Value Date   WBC 8.9 09/28/2018   HGB 13.3 09/28/2018   HCT 39.4 09/28/2018   PLT 282.0 09/28/2018   GLUCOSE 100 (H) 09/28/2018   CHOL 131 09/28/2018   TRIG 84.0 09/28/2018   HDL 77.70 09/28/2018   LDLCALC 37 09/28/2018   ALT 17 09/28/2018   AST 17 09/28/2018   NA 141 09/28/2018   K 4.1 09/28/2018   CL 102 09/28/2018   CREATININE 1.44 (H) 09/28/2018   BUN 27 (H) 09/28/2018   CO2 30 09/28/2018   TSH 3.29  09/28/2018   INR 1.0 03/21/2017   HGBA1C 5.6 09/28/2018   MICROALBUR <0.7 09/28/2018       Assessment & Plan:

## 2018-09-28 NOTE — Assessment & Plan Note (Signed)
Also for a1c with labs, for wt loss and diet control

## 2018-10-11 ENCOUNTER — Ambulatory Visit (INDEPENDENT_AMBULATORY_CARE_PROVIDER_SITE_OTHER): Payer: Managed Care, Other (non HMO) | Admitting: Bariatrics

## 2018-10-11 ENCOUNTER — Encounter (INDEPENDENT_AMBULATORY_CARE_PROVIDER_SITE_OTHER): Payer: Self-pay | Admitting: Bariatrics

## 2018-10-11 VITALS — BP 110/72 | HR 65 | Temp 97.7°F | Ht 64.0 in | Wt 232.0 lb

## 2018-10-11 DIAGNOSIS — Z9189 Other specified personal risk factors, not elsewhere classified: Secondary | ICD-10-CM | POA: Diagnosis not present

## 2018-10-11 DIAGNOSIS — Z6839 Body mass index (BMI) 39.0-39.9, adult: Secondary | ICD-10-CM

## 2018-10-11 DIAGNOSIS — F3289 Other specified depressive episodes: Secondary | ICD-10-CM | POA: Diagnosis not present

## 2018-10-11 DIAGNOSIS — E7849 Other hyperlipidemia: Secondary | ICD-10-CM

## 2018-10-11 DIAGNOSIS — R7303 Prediabetes: Secondary | ICD-10-CM

## 2018-10-11 MED ORDER — BUPROPION HCL ER (SR) 100 MG PO TB12: 100.0000 mg | ORAL_TABLET | Freq: Every day | ORAL | 0 refills | Status: DC

## 2018-10-11 MED ORDER — VITAMIN B-12 1000 MCG SL SUBL: 1000.0000 ug | SUBLINGUAL_TABLET | Freq: Every day | SUBLINGUAL | 0 refills | Status: DC

## 2018-10-11 MED ORDER — VITAMIN C 500 MG PO TABS: 500.0000 mg | ORAL_TABLET | Freq: Every day | ORAL | Status: DC

## 2018-10-11 NOTE — Progress Notes (Signed)
Office: [phone]  /  Fax: [phone]   HPI:   Chief Complaint: OBESITY Suzanne Hansen is here to discuss her progress with her obesity treatment plan. She is on the Category 3 plan and is following her eating plan approximately 100 % of the time. She states she is exercising 0 minutes 0 times per week. Suzanne Hansen is doing well overall. She would like to have some other options. Her weight is 232 lb (105.2 kg) today and has had a weight loss of 2 pounds over a period of 2 weeks since her last visit. She has lost 9 lbs since starting treatment with [place].  Depression with emotional eating behaviors Suzanne Hansen had been on Topamax and numbness was noted. She struggles with emotional eating and using food for comfort to the extent that it is negatively impacting her health. She often snacks when she is not hungry. Suzanne Hansen sometimes feels she is out of control and then feels guilty that she made poor food choices. She has been working on behavior modification techniques to help reduce her emotional eating and has been somewhat successful. She shows no sign of suicidal or homicidal ideations.  Pre-Diabetes Suzanne Hansen has a diagnosis of prediabetes based on her elevated Hgb A1c and was informed this puts her at greater risk of developing diabetes. Her last insulin level was at 17.4 and last A1c was at 5.6. She is not taking medications currently and continues to work on diet and exercise to decrease risk of diabetes. She denies excessive hunger.  At risk for diabetes Suzanne Hansen is at higher than average risk for developing diabetes due to her obesity and prediabetes. She currently denies polyuria or polydipsia.  Hyperlipidemia Suzanne Hansen has hyperlipidemia and she is currently taking Crestor. She has been trying to improve her cholesterol levels with intensive lifestyle modification including a low saturated fat diet, exercise and weight loss. She denies any chest pain, claudication or myalgias.  Depression  screen [facility] 2/9 09/28/2018 08/09/2018 05/16/2018 09/27/2017 06/14/2017  Decreased Interest 0 2 0 0 0  Down, Depressed, Hopeless 1 1 1  0 0  PHQ - 2 Score 1 3 1  0 0  Altered sleeping - 2 - - -  Tired, decreased energy - 3 - - -  Change in appetite - 1 - - -  Feeling bad or failure about yourself  - 1 - - -  Trouble concentrating - 0 - - -  Moving slowly or fidgety/restless - 2 - - -  Suicidal thoughts - 0 - - -  PHQ-9 Score - 12 - - -  Difficult doing work/chores - Somewhat difficult - - -  Some recent data might be hidden     ASSESSMENT AND PLAN:  Prediabetes  Other depression - Plan: buPROPion (WELLBUTRIN SR) 100 MG 12 hr tablet, vitamin C (ASCORBIC ACID) 500 MG tablet, Cyanocobalamin (VITAMIN B-12) 1000 MCG SUBL  Other hyperlipidemia  At risk for diabetes mellitus  Class 2 severe obesity with serious comorbidity and body mass index (BMI) of 39.0 to 39.9 in adult, unspecified obesity type (HCC)  PLAN:  Depression with Emotional Eating Behaviors We discussed behavior modification techniques today to help Suzanne Hansen deal with her emotional eating and depression. She has agreed to start OTC vitamin C 500 mg BID and OTC vitamin B12 1,000 mcg sublingual daily. [name] agreed to take Bupropion (Wellbutrin SR) 100 mg qd #30 with no refills and follow up as directed.  Pre-Diabetes Suzanne Hansen will continue to work on weight loss, exercise, increasing lean protein and  decreasing simple carbohydrates in her diet to help decrease the risk of diabetes. She was informed that eating too many simple carbohydrates or too many calories at one sitting increases the likelihood of GI side effects. [name] agreed to follow up with [place] as directed to monitor her progress.  Diabetes risk counseling Suzanne Hansen was given extended (15 minutes) diabetes prevention counseling today. She is 63 y.o. female and has risk factors for diabetes including obesity and prediabetes. We discussed intensive lifestyle  modifications today with an emphasis on weight loss as well as increasing exercise and decreasing simple carbohydrates in her diet.  Hyperlipidemia Suzanne Hansen was informed of the American Heart Association Guidelines emphasizing intensive lifestyle modifications as the first line treatment for hyperlipidemia. We discussed many lifestyle modifications today in depth, and Suzanne Hansen will continue to work on decreasing saturated fats such as fatty red meat, butter and many fried foods. She will also increase vegetables and lean protein in her diet and continue to work on exercise and weight loss efforts. She will continue Crestor as prescribed and follow up at the agreed upon time.  Obesity Suzanne Hansen is currently in the action stage of change. As such, her goal is to continue with weight loss efforts She has agreed to follow the Category 3 plan with additional Category 1 and Category 2 breakfast options Suzanne Hansen will do YouTube abdominal exercises for weight loss and overall health benefits. We discussed the following Behavioral Modification Strategies today: keeping healthy foods in the home, increase H2O intake, increasing lean protein intake, decreasing simple carbohydrates, travel eating strategies, order first, eat protein first then vegetables, portion size, increasing vegetables and work on meal planning and easy cooking plans  Suzanne Hansen has agreed to follow up with our clinic in 2 weeks. She was informed of the importance of frequent follow up visits to maximize her success with intensive lifestyle modifications for her multiple health conditions.  ALLERGIES: Allergies  Allergen Reactions  . Ivp Dye [Iodinated Diagnostic Agents] Shortness Of Breath and Other (See Comments)    Swelling of extremity of the injection site  . Latex Itching    ITCHING AND COLD SORES AROUND MOUTH WHEN LATEX GLOVES USED BY THE DENTIST/HYGENTIST  . Clarithromycin Nausea And Vomiting    REACTION: nausea  vomiting  .  Iodine   . Lisinopril   . Metronidazole Nausea And Vomiting  . Shellfish Allergy   . Zostavax [Zoster Vaccine Live]     MEDICATIONS: Current Outpatient Medications on File Prior to Visit  Medication Sig Dispense Refill  . albuterol (PROVENTIL HFA;VENTOLIN HFA) 108 (90 Base) MCG/ACT inhaler Inhale 2 puffs into the lungs every 4 (four) hours as needed for wheezing or shortness of breath. 1 Inhaler 3  . allopurinol (ZYLOPRIM) 100 MG tablet Take 100 mg by mouth daily.    [name] aspirin 81 MG EC tablet Take 81 mg by mouth daily.      . budesonide-formoterol (SYMBICORT) 160-4.5 MCG/ACT inhaler INHALE 2 PUFFS INTO THE LUNGS TWICE DAILY 10.2 g 6  . Cholecalciferol (VITAMIN D) 50 MCG (2000 UT) CAPS Take by mouth.    . fexofenadine (ALLEGRA) 180 MG tablet Take 180 mg by mouth daily.      . fluticasone (FLONASE) 50 MCG/ACT nasal spray Place 2 sprays into both nostrils daily. 16 g 6  . gabapentin (NEURONTIN) 300 MG capsule TAKE 1 CAPSULE BY MOUTH THREE TIMES A DAY 90 capsule 1  . KLOR-CON M20 20 MEQ tablet TAKE 2 TABLETS (40 MEQ TOTAL) BY MOUTH DAILY  3  . levothyroxine (SYNTHROID, LEVOTHROID) 100 MCG tablet Take 1 tablet (100 mcg total) by mouth daily before breakfast. 90 tablet 3  . losartan (COZAAR) 25 MG tablet Take 25 mg by mouth daily.    . Multiple Vitamin (MULTIVITAMIN) capsule Take 1 capsule by mouth daily.      [name] omeprazole (PRILOSEC) 20 MG capsule Take 20 mg by mouth as needed.    . rosuvastatin (CRESTOR) 10 MG tablet TAKE ONE TABLET BY MOUTH DAILY 180 tablet 1  . torsemide (DEMADEX) 20 MG tablet 40mg  in the morning and 40mg  evening time.    . valACYclovir (VALTREX) 1000 MG tablet Take 500 mg by mouth daily.  0   No current facility-administered medications on file prior to visit.     PAST MEDICAL HISTORY: Past Medical History:  Diagnosis Date  . ALLERGIC RHINITIS 05/09/2008  . Allergy   . ANXIETY 05/23/2007  . ARM PAIN, LEFT 10/23/2009  . Arthritis   . Asthma   . ASTHMA 05/23/2007  .  Back pain   . Cardiomyopathy ([place]) 09/08/2015  . Chest pain   . CHF (congestive heart failure) ([place])   . Constipation   . CONTACT DERMATITIS 06/09/2009  . Diabetes mellitus   . ENDOMETRIOSIS NOS 05/23/2007  . FACIAL PAIN 06/02/2010  . Food allergy   . FREQUENCY, URINARY 05/17/2010  . GLUCOSE INTOLERANCE 08/28/2009  . HERPES SIMPLEX, UNCOMPLICATED 1/61/0960  . Hyperlipidemia 08/28/2015  . HYPERTHYROIDISM 05/23/2007  . Hypoactive thyroid   . HYPOTHYROIDISM 05/09/2008  . Impaired glucose tolerance 01/28/2011  . INSOMNIA, HX OF 05/23/2007  . Joint pain   . LATERAL EPICONDYLITIS, RIGHT 03/10/2009  . LIPOMA 10/22/2010  . NECK PAIN 11/10/2010  . OBESITY 05/23/2007  . Osteopenia 09/28/2018  . Plantar fasciitis    Both feet  . SHOULDER PAIN, LEFT 05/17/2010  . SINUSITIS- ACUTE-NOS 11/03/2008  . SKIN LESION 11/10/2010  . Sleep apnea   . Stage III chronic kidney disease ([place])   . Unspecified Chest Pain 07/25/2008  . UNSPECIFIED URTICARIA 06/04/2009  . URI 08/28/2009  . UTI 04/29/2008  . VITAMIN D DEFICIENCY 08/28/2009  . Wheezing 07/12/2010    PAST SURGICAL HISTORY: Past Surgical History:  Procedure Laterality Date  . COLONOSCOPY    . ENDOMETRIAL ABLATION    . LAMINECTOMY  1996  . LAPAROTOMY     exploratory  . lower back surgery    . POLYPECTOMY    . s/p endometrial ablation  12/06  . TUBAL LIGATION    . UTERINE FIBROID SURGERY    . WISDOM TOOTH EXTRACTION      SOCIAL HISTORY: Social History   Tobacco Use  . Smoking status: Former Smoker    Packs/day: 0.50    Types: Cigarettes    Last attempt to quit: 11/05/1987    Years since quitting: 30.9  . Smokeless tobacco: Never Used  Substance Use Topics  . Alcohol use: No    Alcohol/week: 1.0 standard drinks    Types: 1 Glasses of wine per week    Comment: WINE  . Drug use: No    FAMILY HISTORY: Family History  Problem Relation Age of Onset  . Diabetes Cousin   . Hypertension Cousin   . Heart disease Cousin   . Heart disease  Mother        Rheumatic heart disease  . Depression Mother   . COPD Father   . Hypertension Father   . Cancer Maternal Grandmother        Breast  .  Diabetes Sister   . Kidney disease Sister     ROS: Review of Systems  Constitutional: Positive for weight loss.  Cardiovascular: Negative for chest pain and claudication.  Genitourinary: Negative for frequency.  Musculoskeletal: Negative for myalgias.  Neurological:       + numbness  Endo/Heme/Allergies: Negative for polydipsia.       Negative for polyphagia  Psychiatric/Behavioral: Positive for depression. Negative for suicidal ideas.    PHYSICAL EXAM: Blood pressure 110/72, pulse 65, temperature 97.7 F (36.5 C), temperature source Oral, height 5\' 4"  (1.626 m), weight 232 lb (105.2 kg), SpO2 98 %. Body mass index is 39.82 kg/m. Physical Exam Vitals signs reviewed.  Constitutional:      Appearance: Normal appearance. She is well-developed. She is obese.  Cardiovascular:     Rate and Rhythm: Normal rate.  Pulmonary:     Effort: Pulmonary effort is normal.  Musculoskeletal: Normal range of motion.  Skin:    General: Skin is warm and dry.  Neurological:     Mental Status: She is alert and oriented to person, place, and time.  Psychiatric:        Mood and Affect: Mood normal.        Behavior: Behavior normal.     RECENT LABS AND TESTS: BMET    Component Value Date/Time   NA 141 09/28/2018 1519   NA 140 09/24/2018 1050   K 4.1 09/28/2018 1519   CL 102 09/28/2018 1519   CO2 30 09/28/2018 1519   GLUCOSE 100 (H) 09/28/2018 1519   BUN 27 (H) 09/28/2018 1519   BUN 24 09/24/2018 1050   CREATININE 1.44 (H) 09/28/2018 1519   CREATININE 0.97 09/17/2015 1113   CALCIUM 10.3 09/28/2018 1519   GFRNONAA 39 (L) 09/24/2018 1050   GFRAA 45 (L) 09/24/2018 1050   Lab Results  Component Value Date   HGBA1C 5.6 09/28/2018   HGBA1C 5.6 08/09/2018   HGBA1C 5.7 (A) 05/04/2018   HGBA1C 6.0 09/27/2017   HGBA1C 5.9 03/21/2017    Lab Results  Component Value Date   INSULIN 17.9 08/09/2018   CBC    Component Value Date/Time   WBC 8.9 09/28/2018 1519   RBC 4.50 09/28/2018 1519   HGB 13.3 09/28/2018 1519   HCT 39.4 09/28/2018 1519   PLT 282.0 09/28/2018 1519   MCV 87.6 09/28/2018 1519   MCH 31.4 11/18/2016 1554   MCHC 33.9 09/28/2018 1519   RDW 13.2 09/28/2018 1519   LYMPHSABS 2.7 09/28/2018 1519   MONOABS 0.8 09/28/2018 1519   EOSABS 0.1 09/28/2018 1519   BASOSABS 0.1 09/28/2018 1519   Iron/TIBC/Ferritin/ %Sat    Component Value Date/Time   IRON 55 03/21/2017 0935   Lipid Panel     Component Value Date/Time   CHOL 131 09/28/2018 1519   TRIG 84.0 09/28/2018 1519   HDL 77.70 09/28/2018 1519   CHOLHDL 2 09/28/2018 1519   VLDL 16.8 09/28/2018 1519   LDLCALC 37 09/28/2018 1519   Hepatic Function Panel     Component Value Date/Time   PROT 7.9 09/28/2018 1519   PROT 6.7 09/24/2018 1050   ALBUMIN 4.7 09/28/2018 1519   ALBUMIN 4.4 09/24/2018 1050   AST 17 09/28/2018 1519   ALT 17 09/28/2018 1519   ALKPHOS 103 09/28/2018 1519   BILITOT 0.4 09/28/2018 1519   BILITOT 0.5 09/24/2018 1050   BILIDIR 0.2 09/28/2018 1519      Component Value Date/Time   TSH 3.29 09/28/2018 1519   TSH 2.800 09/24/2018 1050  TSH 0.132 (L) 08/09/2018 1003   Results for GIRTIE, WIERSMA (MRN [id number]) as of 10/11/2018 14:52  Ref. Range 09/24/2018 10:50  Vitamin D, 25-Hydroxy Latest Ref Range: 30.0 - 100.0 ng/mL 35.7     OBESITY BEHAVIORAL INTERVENTION VISIT  Today's visit was # 5   Starting weight: 241 lbs Starting date: 08/09/2018 Today's weight : 232 lbs Today's date: 10/11/2018 Total lbs lost to date: 9   ASK: We discussed the diagnosis of obesity with Suzanne Hansen today and As[place] agreed to give [place] permission to discuss obesity behavioral modification therapy today.  ASSESS: Yulonda has the diagnosis of obesity and her BMI today is 39.8 Areebah is in the action stage of change    ADVISE: Annamay was educated on the multiple health risks of obesity as well as the benefit of weight loss to improve her health. She was advised of the need for long term treatment and the importance of lifestyle modifications to improve her current health and to decrease her risk of future health problems.  AGREE: Multiple dietary modification options and treatment options were discussed and  Carlei agreed to follow the recommendations documented in the above note.  ARRANGE: Pandora was educated on the importance of frequent visits to treat obesity as outlined per CMS and USPSTF guidelines and agreed to schedule her next follow up appointment today.  Corey Skains, am acting as Location manager for [employer]. Owens Shark, DO  I have reviewed the above documentation for accuracy and completeness, and I agree with the above. -Jearld Lesch, DO

## 2018-10-15 ENCOUNTER — Encounter: Payer: Managed Care, Other (non HMO) | Admitting: Internal Medicine

## 2018-10-22 ENCOUNTER — Ambulatory Visit: Payer: Self-pay | Admitting: *Deleted

## 2018-10-22 NOTE — Progress Notes (Signed)
[name] [address] [place], [place] [address] Phone: [phone] Subjective:    I [name] am serving as a Education administrator for Dr. Hulan Saas.   CC: Right arm pain with associated numbness  [id number]  Suzanne Hansen is a 63 y.o. female coming in with complaint of back pain. States that her back is still painful. Has numbness and tingling in her right arm. Has an appointment with neuro. Sometimes she has neck pain.  States that she is noticing some increasing discomfort and pain is mild is mild weakness of the upper extremity.  States that she is noticing she is dropping things on a more regular basis.  Rates the severity of pain 7 out of 10.   Patient had neck x-rays taken in 2006.  These were independently visualized by me.  Had questionable muscle spasm as well as mild degenerative disc disease from C5-C7  Past Medical History:  Diagnosis Date  . ALLERGIC RHINITIS 05/09/2008  . Allergy   . ANXIETY 05/23/2007  . ARM PAIN, LEFT 10/23/2009  . Arthritis   . Asthma   . ASTHMA 05/23/2007  . Back pain   . Cardiomyopathy ([place]) 09/08/2015  . Chest pain   . CHF (congestive heart failure) ([place])   . Constipation   . CONTACT DERMATITIS 06/09/2009  . Diabetes mellitus   . ENDOMETRIOSIS NOS 05/23/2007  . FACIAL PAIN 06/02/2010  . Food allergy   . FREQUENCY, URINARY 05/17/2010  . GLUCOSE INTOLERANCE 08/28/2009  . HERPES SIMPLEX, UNCOMPLICATED 2/95/2841  . Hyperlipidemia 08/28/2015  . HYPERTHYROIDISM 05/23/2007  . Hypoactive thyroid   . HYPOTHYROIDISM 05/09/2008  . Impaired glucose tolerance 01/28/2011  . INSOMNIA, HX OF 05/23/2007  . Joint pain   . LATERAL EPICONDYLITIS, RIGHT 03/10/2009  . LIPOMA 10/22/2010  . NECK PAIN 11/10/2010  . OBESITY 05/23/2007  . Osteopenia 09/28/2018  . Plantar fasciitis    Both feet  . SHOULDER PAIN, LEFT 05/17/2010  . SINUSITIS- ACUTE-NOS 11/03/2008  . SKIN LESION 11/10/2010  . Sleep apnea   . Stage III chronic kidney disease  ([place])   . Unspecified Chest Pain 07/25/2008  . UNSPECIFIED URTICARIA 06/04/2009  . URI 08/28/2009  . UTI 04/29/2008  . VITAMIN D DEFICIENCY 08/28/2009  . Wheezing 07/12/2010   Past Surgical History:  Procedure Laterality Date  . COLONOSCOPY    . ENDOMETRIAL ABLATION    . LAMINECTOMY  1996  . LAPAROTOMY     exploratory  . lower back surgery    . POLYPECTOMY    . s/p endometrial ablation  12/06  . TUBAL LIGATION    . UTERINE FIBROID SURGERY    . WISDOM TOOTH EXTRACTION     Social History   Socioeconomic History  . Marital status: Divorced    Spouse name: Not on file  . Number of children: 3  . Years of education: Not on file  . Highest education level: Not on file  Occupational History  . Occupation: SOCIAL WORKER    Employer: [employer]    Comment: [place]  . Financial resource strain: Not on file  . Food insecurity:    Worry: Not on file    Inability: Not on file  . Transportation needs:    Medical: Not on file    Non-medical: Not on file  Tobacco Use  . Smoking status: Former Smoker    Packs/day: 0.50    Types: Cigarettes    Last attempt to quit: 11/05/1987  Years since quitting: 30.9  . Smokeless tobacco: Never Used  Substance and Sexual Activity  . Alcohol use: No    Alcohol/week: 1.0 standard drinks    Types: 1 Glasses of wine per week    Comment: WINE  . Drug use: No  . Sexual activity: Not on file  Lifestyle  . Physical activity:    Days per week: Not on file    Minutes per session: Not on file  . Stress: Not on file  Relationships  . Social connections:    Talks on phone: Not on file    Gets together: Not on file    Attends religious service: Not on file    Active member of club or organization: Not on file    Attends meetings of clubs or organizations: Not on file    Relationship status: Not on file  Other Topics Concern  . Not on file  Social History Narrative  . Not on file   Allergies  Allergen  Reactions  . Ivp Dye [Iodinated Diagnostic Agents] Shortness Of Breath and Other (See Comments)    Swelling of extremity of the injection site  . Latex Itching    ITCHING AND COLD SORES AROUND MOUTH WHEN LATEX GLOVES USED BY THE DENTIST/HYGENTIST  . Clarithromycin Nausea And Vomiting    REACTION: nausea  vomiting  . Iodine   . Lisinopril   . Metronidazole Nausea And Vomiting  . Shellfish Allergy   . Zostavax [Zoster Vaccine Live]    Family History  Problem Relation Age of Onset  . Diabetes Cousin   . Hypertension Cousin   . Heart disease Cousin   . Heart disease Mother        Rheumatic heart disease  . Depression Mother   . COPD Father   . Hypertension Father   . Cancer Maternal Grandmother        Breast  . Diabetes Sister   . Kidney disease Sister     Current Outpatient Medications (Endocrine & Metabolic):  .  levothyroxine (SYNTHROID, LEVOTHROID) 100 MCG tablet, Take 1 tablet (100 mcg total) by mouth daily before breakfast. .  predniSONE (DELTASONE) 50 MG tablet, Take 1 tablet (50 mg total) by mouth daily.  Current Outpatient Medications (Cardiovascular):  .  losartan (COZAAR) 25 MG tablet, Take 25 mg by mouth daily. .  rosuvastatin (CRESTOR) 10 MG tablet, TAKE ONE TABLET BY MOUTH DAILY .  torsemide (DEMADEX) 20 MG tablet, 40mg  in the morning and 40mg  evening time.  Current Outpatient Medications (Respiratory):  .  albuterol (PROVENTIL HFA;VENTOLIN HFA) 108 (90 Base) MCG/ACT inhaler, Inhale 2 puffs into the lungs every 4 (four) hours as needed for wheezing or shortness of breath. .  budesonide-formoterol (SYMBICORT) 160-4.5 MCG/ACT inhaler, INHALE 2 PUFFS INTO THE LUNGS TWICE DAILY .  fexofenadine (ALLEGRA) 180 MG tablet, Take 180 mg by mouth daily.   .  fluticasone (FLONASE) 50 MCG/ACT nasal spray, Place 2 sprays into both nostrils daily.  Current Outpatient Medications (Analgesics):  .  allopurinol (ZYLOPRIM) 100 MG tablet, Take 100 mg by mouth daily. [name]  aspirin 81  MG EC tablet, Take 81 mg by mouth daily.    Current Outpatient Medications (Hematological):  [name]  Cyanocobalamin (VITAMIN B-12) 1000 MCG SUBL, Place 1 tablet (1,000 mcg total) under the tongue daily.  Current Outpatient Medications (Other):  [name]  buPROPion (WELLBUTRIN SR) 100 MG 12 hr tablet, Take 1 tablet (100 mg total) by mouth daily. .  Cholecalciferol (VITAMIN D) 50 MCG (2000  UT) CAPS, Take by mouth. .  gabapentin (NEURONTIN) 300 MG capsule, TAKE 1 CAPSULE BY MOUTH THREE TIMES A DAY .  KLOR-CON M20 20 MEQ tablet, TAKE 2 TABLETS (40 MEQ TOTAL) BY MOUTH DAILY .  Multiple Vitamin (MULTIVITAMIN) capsule, Take 1 capsule by mouth daily.   [name]  omeprazole (PRILOSEC) 20 MG capsule, Take 20 mg by mouth as needed. .  valACYclovir (VALTREX) 1000 MG tablet, Take 500 mg by mouth daily. .  vitamin C (ASCORBIC ACID) 500 MG tablet, Take 1 tablet (500 mg total) by mouth daily. [name]  gabapentin (NEURONTIN) 100 MG capsule, Take 1 capsule (100 mg total) by mouth 2 (two) times daily.    Past medical history, social, surgical and family history all reviewed in electronic medical record.  No pertanent information unless stated regarding to the chief complaint.   Review of Systems:  No headache, visual changes, nausea, vomiting, diarrhea, constipation, dizziness, abdominal pain, skin rash, fevers, chills, night sweats, weight loss, swollen lymph nodes, body aches, joint swelling, , chest pain, shortness of breath, mood changes.  Positive muscle aches  Objective  Blood pressure 110/80, pulse 80, height 5\' 4"  (1.626 m), weight 234 lb (106.1 kg), SpO2 96 %.    General: No apparent distress alert and oriented x3 mood and affect normal, dressed appropriately.  HEENT: Pupils equal, extraocular movements intact  Respiratory: Patient's speak in full sentences and does not appear short of breath  Cardiovascular: No lower extremity edema, non tender, no erythema  Skin: Warm dry intact with no signs of infection or rash on  extremities or on axial skeleton.  Abdomen: Soft nontender  Neuro: Cranial nerves II through XII are intact, neurovascularly intact in all extremities with 2+ DTRs and 2+ pulses.  Lymph: No lymphadenopathy of posterior or anterior cervical chain or axillae bilaterally.  Gait normal with good balance and coordination.  MSK:  Non tender with full range of motion and good stability and symmetric strength and tone of  hip, knee and ankles bilaterally.  Neck: Inspection mild loss of lordosis. No palpable stepoffs. Positive Spurling's maneuver. Decreased range of motion in all planes especially with right-sided rotation and right-sided sidebending Grip strength and sensation normal in bilateral hands Weakness on the right side in the C6 and C8 distribution. Reflexes normal   Impression and Recommendations:     This case required medical decision making of moderate complexity. The above documentation has been reviewed and is accurate and complete Lyndal Pulley, DO       Note: This dictation was prepared with Dragon dictation along with smaller phrase technology. Any transcriptional errors that result from this process are unintentional.

## 2018-10-22 NOTE — Telephone Encounter (Signed)
Patient does have a history of bursitis in that R shoulder- she is requesting appointment with Dr Tamala Julian. Patient states her shoulder is not hurting like it was when she had to injection. Patient states the discomfort is more in muscles to tips of fingers. Best contact #[phone]  Reason for Disposition . [1] Numbness or tingling in one or both hands AND [2] is a chronic symptom (recurrent or ongoing AND present > 4 weeks)    Patient has had symptoms for 4 weeks now- she is requesting appointment with Dr Tamala Julian- triage note sent for review and scheduling.  Answer Assessment - Initial Assessment Questions 1. SYMPTOM: "What is the main symptom you are concerned about?" (e.g., weakness, numbness)     Tingling sensation in hand and arm- r  2. ONSET: "When did this start?" (minutes, hours, days; while sleeping)     4 weeks 3. LAST NORMAL: "When was the last time you were normal (no symptoms)?"     One month 4. PATTERN "Does this come and go, or has it been constant since it started?"  "Is it present now?"     Comes and goes- not present now- thinks it is when patient moves her arms certain ways 5. CARDIAC SYMPTOMS: "Have you had any of the following symptoms: chest pain, difficulty breathing, palpitations?"     no 6. NEUROLOGIC SYMPTOMS: "Have you had any of the following symptoms: headache, dizziness, vision loss, double vision, changes in speech, unsteady on your feet?"     Lower back pain- cyst on spine(surgery) patient has pain on R- patient has scar tissue- patient sees neurosurgeon  7. OTHER SYMPTOMS: "Do you have any other symptoms?"     Patient states she has been cleared by cardiologist and kidney doctor- she has seen rheumatologist 8. PREGNANCY: "Is there any chance you are pregnant?" "When was your last menstrual period?"     n/a  Protocols used: NEUROLOGIC DEFICIT-A-AH

## 2018-10-22 NOTE — Telephone Encounter (Signed)
Spoke to pt, scheduled her for 10/25/18 to see Dr. Tamala Julian.

## 2018-10-23 ENCOUNTER — Ambulatory Visit: Payer: Managed Care, Other (non HMO) | Admitting: Family Medicine

## 2018-10-23 ENCOUNTER — Encounter: Payer: Self-pay | Admitting: Family Medicine

## 2018-10-23 ENCOUNTER — Ambulatory Visit (INDEPENDENT_AMBULATORY_CARE_PROVIDER_SITE_OTHER)
Admission: RE | Admit: 2018-10-23 | Discharge: 2018-10-23 | Disposition: A | Discharge status: Home / Self Care | Payer: Managed Care, Other (non HMO) | Source: Ambulatory Visit | Attending: Family Medicine | Admitting: Family Medicine

## 2018-10-23 VITALS — BP 110/80 | HR 80 | Ht 64.0 in | Wt 234.0 lb

## 2018-10-23 DIAGNOSIS — M542 Cervicalgia: Secondary | ICD-10-CM | POA: Diagnosis not present

## 2018-10-23 DIAGNOSIS — M5412 Radiculopathy, cervical region: Secondary | ICD-10-CM | POA: Diagnosis not present

## 2018-10-23 DIAGNOSIS — G8929 Other chronic pain: Secondary | ICD-10-CM

## 2018-10-23 MED ORDER — GABAPENTIN 100 MG PO CAPS: 100.0000 mg | ORAL_CAPSULE | Freq: Two times a day (BID) | ORAL | 3 refills | Status: DC

## 2018-10-23 MED ORDER — PREDNISONE 50 MG PO TABS: 50.0000 mg | ORAL_TABLET | Freq: Every day | ORAL | 0 refills | Status: DC

## 2018-10-23 NOTE — Patient Instructions (Signed)
Good to see you  I am sorry you are hurting.  Xray downstairs Prednsione daily for 5 days  gabapnetin 100mg  in AM, 100mg  in PM and 300mg  at night See me again in 7-10 days to make sure you are doing better

## 2018-10-23 NOTE — Assessment & Plan Note (Signed)
C6-C8 distribution.  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  We discussed with patient in great length.  Patient has seen a neurosurgeon previously.  Prednisone and increase gabapentin at this time.  Follow-up again in 7 to 10 weeks.  New x-rays of the cervical spine ordered today as well.  Follow-up if continuing to have weakness of the hand and possible MRI is necessary.  Patient given symptoms and when to seek medical attention if needed sooner.  Spent  25 minutes with patient face-to-face and had greater than 50% of counseling including as described above in assessment and plan.

## 2018-10-24 ENCOUNTER — Ambulatory Visit (INDEPENDENT_AMBULATORY_CARE_PROVIDER_SITE_OTHER): Payer: Managed Care, Other (non HMO) | Admitting: Bariatrics

## 2018-10-24 ENCOUNTER — Encounter (INDEPENDENT_AMBULATORY_CARE_PROVIDER_SITE_OTHER): Payer: Self-pay | Admitting: Bariatrics

## 2018-10-24 VITALS — BP 110/72 | HR 85 | Temp 98.0°F | Ht 64.0 in | Wt 227.0 lb

## 2018-10-24 DIAGNOSIS — M1990 Unspecified osteoarthritis, unspecified site: Secondary | ICD-10-CM

## 2018-10-24 DIAGNOSIS — Z9189 Other specified personal risk factors, not elsewhere classified: Secondary | ICD-10-CM | POA: Diagnosis not present

## 2018-10-24 DIAGNOSIS — R7303 Prediabetes: Secondary | ICD-10-CM

## 2018-10-24 DIAGNOSIS — Z6839 Body mass index (BMI) 39.0-39.9, adult: Secondary | ICD-10-CM

## 2018-10-24 DIAGNOSIS — F3289 Other specified depressive episodes: Secondary | ICD-10-CM

## 2018-10-24 MED ORDER — UNABLE TO FIND: 1 refills | Status: DC

## 2018-10-24 MED ORDER — BLUE-EMU SUPER STRENGTH EX CREA: 1.0000 | TOPICAL_CREAM | Freq: Two times a day (BID) | CUTANEOUS | 1 refills | Status: DC

## 2018-10-24 MED ORDER — BUPROPION HCL ER (SR) 100 MG PO TB12: 100.0000 mg | ORAL_TABLET | Freq: Every day | ORAL | 0 refills | Status: DC

## 2018-10-25 ENCOUNTER — Ambulatory Visit: Payer: Managed Care, Other (non HMO) | Admitting: Family Medicine

## 2018-10-25 NOTE — Progress Notes (Signed)
Office: [phone]  /  Fax: [phone]   HPI:   Chief Complaint: OBESITY Suzanne Hansen is here to discuss her progress with her obesity treatment plan. She is on the Category 3 plan with additional Category 1 and Category 2 breakfast options and is following her eating plan approximately 80 % of the time. She states she is exercising 0 minutes 0 times per week. Suzanne Hansen has been on vacation, but she made good decisions. She is doing well overall. Suzanne Hansen is doing more meal planning. Her weight is 227 lb (103 kg) today and has had a weight loss of 5 pounds over a period of 2 weeks since her last visit. She has lost 14 lbs since starting treatment with [place].  Pre-Diabetes Suzanne Hansen has a diagnosis of prediabetes based on her elevated Hgb A1c and was informed this puts her at greater risk of developing diabetes. She is not taking medications currently and continues to work on diet and exercise to decrease risk of diabetes. She denies hypoglycemia.  At risk for diabetes Suzanne Hansen is at higher than average risk for developing diabetes due to her obesity. She currently denies polyuria or polydipsia.  OA (osteoarthritis) pain radiating down right arm Suzanne Hansen has osteoarthritis radiating down her right arm. She has some paresthesias in her right arm and hand.  Depression with emotional eating behaviors Suzanne Hansen is struggling with emotional eating and using food for comfort to the extent that it is negatively impacting her health. She often snacks when she is not hungry. Suzanne Hansen sometimes feels she is out of control and then feels guilty that she made poor food choices. She has been working on behavior modification techniques to help reduce her emotional eating and has been somewhat successful. She shows no sign of suicidal or homicidal ideations.  Depression screen [facility] 2/9 09/28/2018 08/09/2018 05/16/2018 09/27/2017 06/14/2017  Decreased Interest 0 2 0 0 0  Down, Depressed, Hopeless 1 1 1  0 0  PHQ - 2  Score 1 3 1  0 0  Altered sleeping - 2 - - -  Tired, decreased energy - 3 - - -  Change in appetite - 1 - - -  Feeling bad or failure about yourself  - 1 - - -  Trouble concentrating - 0 - - -  Moving slowly or fidgety/restless - 2 - - -  Suicidal thoughts - 0 - - -  PHQ-9 Score - 12 - - -  Difficult doing work/chores - Somewhat difficult - - -  Some recent data might be hidden      ASSESSMENT AND PLAN:  Prediabetes  Arthritis - Plan: Liniments (BLUE-EMU SUPER STRENGTH) CREA  At risk for diabetes mellitus  Class 2 severe obesity with serious comorbidity and body mass index (BMI) of 39.0 to 39.9 in adult, unspecified obesity type (HCC)  Other depression - Plan: buPROPion (WELLBUTRIN SR) 100 MG 12 hr tablet  PLAN:  Pre-Diabetes Suzanne Hansen will continue to work on weight loss, exercise, increasing lean protein and decreasing simple carbohydrates in her diet to help decrease the risk of diabetes. She was informed that eating too many simple carbohydrates or too many calories at one sitting increases the likelihood of GI side effects. Suzanne Hansen agreed to follow up with [place] as directed to monitor her progress.  Diabetes risk counseling Suzanne Hansen was given extended (15 minutes) diabetes prevention counseling today. She is 63 y.o. female and has risk factors for diabetes including obesity and pre-diabetes. We discussed intensive lifestyle modifications today with an emphasis on weight  loss as well as increasing exercise and decreasing simple carbohydrates in her diet.  OA (osteoarthritis) pain radiating down right arm Suzanne Hansen agreed to use Emu Blue (12 oz) apply as directed (prescription printed) and follow up with her PCP.  Depression with Emotional Eating Behaviors We discussed behavior modification techniques today to help Suzanne Hansen deal with her emotional eating and depression. She has agreed to take Bupropion (Wellbutrin SR) 100 mg qd #30 with no refills and follow up as  directed.  Obesity Suzanne Hansen is currently in the action stage of change. As such, her goal is to continue with weight loss efforts She has agreed to follow the Category 2 plan Suzanne Hansen will start some resistance exercise and bike exercise for weight loss and overall health benefits. We discussed the following Behavioral Modification Strategies today: increase H2O intake, keeping healthy foods in the home, increasing lean protein intake, decreasing simple carbohydrates , increasing vegetables and work on meal planning and easy cooking plans  Suzanne Hansen has agreed to follow up with our clinic in 2 weeks. She was informed of the importance of frequent follow up visits to maximize her success with intensive lifestyle modifications for her multiple health conditions.  ALLERGIES: Allergies  Allergen Reactions  . Ivp Dye [Iodinated Diagnostic Agents] Shortness Of Breath and Other (See Comments)    Swelling of extremity of the injection site  . Latex Itching    ITCHING AND COLD SORES AROUND MOUTH WHEN LATEX GLOVES USED BY THE DENTIST/HYGENTIST  . Clarithromycin Nausea And Vomiting    REACTION: nausea  vomiting  . Iodine   . Lisinopril   . Metronidazole Nausea And Vomiting  . Shellfish Allergy   . Zostavax [Zoster Vaccine Live]     MEDICATIONS: Current Outpatient Medications on File Prior to Visit  Medication Sig Dispense Refill  . albuterol (PROVENTIL HFA;VENTOLIN HFA) 108 (90 Base) MCG/ACT inhaler Inhale 2 puffs into the lungs every 4 (four) hours as needed for wheezing or shortness of breath. 1 Inhaler 3  . allopurinol (ZYLOPRIM) 100 MG tablet Take 100 mg by mouth daily.    [name] aspirin 81 MG EC tablet Take 81 mg by mouth daily.      . budesonide-formoterol (SYMBICORT) 160-4.5 MCG/ACT inhaler INHALE 2 PUFFS INTO THE LUNGS TWICE DAILY 10.2 g 6  . Cholecalciferol (VITAMIN D) 50 MCG (2000 UT) CAPS Take by mouth.    . Cyanocobalamin (VITAMIN B-12) 1000 MCG SUBL Place 1 tablet (1,000 mcg total)  under the tongue daily.  0  . fexofenadine (ALLEGRA) 180 MG tablet Take 180 mg by mouth daily.      . fluticasone (FLONASE) 50 MCG/ACT nasal spray Place 2 sprays into both nostrils daily. 16 g 6  . gabapentin (NEURONTIN) 100 MG capsule Take 1 capsule (100 mg total) by mouth 2 (two) times daily. 60 capsule 3  . gabapentin (NEURONTIN) 300 MG capsule TAKE 1 CAPSULE BY MOUTH THREE TIMES A DAY 90 capsule 1  . KLOR-CON M20 20 MEQ tablet TAKE 2 TABLETS (40 MEQ TOTAL) BY MOUTH DAILY  3  . levothyroxine (SYNTHROID, LEVOTHROID) 100 MCG tablet Take 1 tablet (100 mcg total) by mouth daily before breakfast. 90 tablet 3  . losartan (COZAAR) 25 MG tablet Take 25 mg by mouth daily.    . Multiple Vitamin (MULTIVITAMIN) capsule Take 1 capsule by mouth daily.      [name] omeprazole (PRILOSEC) 20 MG capsule Take 20 mg by mouth as needed.    . predniSONE (DELTASONE) 50 MG tablet Take 1 tablet (  50 mg total) by mouth daily. 5 tablet 0  . rosuvastatin (CRESTOR) 10 MG tablet TAKE ONE TABLET BY MOUTH DAILY 180 tablet 1  . torsemide (DEMADEX) 20 MG tablet 40mg  in the morning and 40mg  evening time.    . valACYclovir (VALTREX) 1000 MG tablet Take 500 mg by mouth daily.  0  . vitamin C (ASCORBIC ACID) 500 MG tablet Take 1 tablet (500 mg total) by mouth daily. 30 tablet    No current facility-administered medications on file prior to visit.     PAST MEDICAL HISTORY: Past Medical History:  Diagnosis Date  . ALLERGIC RHINITIS 05/09/2008  . Allergy   . ANXIETY 05/23/2007  . ARM PAIN, LEFT 10/23/2009  . Arthritis   . Asthma   . ASTHMA 05/23/2007  . Back pain   . Cardiomyopathy ([place]) 09/08/2015  . Chest pain   . CHF (congestive heart failure) ([place])   . Constipation   . CONTACT DERMATITIS 06/09/2009  . Diabetes mellitus   . ENDOMETRIOSIS NOS 05/23/2007  . FACIAL PAIN 06/02/2010  . Food allergy   . FREQUENCY, URINARY 05/17/2010  . GLUCOSE INTOLERANCE 08/28/2009  . HERPES SIMPLEX, UNCOMPLICATED 8/65/7846  . Hyperlipidemia  08/28/2015  . HYPERTHYROIDISM 05/23/2007  . Hypoactive thyroid   . HYPOTHYROIDISM 05/09/2008  . Impaired glucose tolerance 01/28/2011  . INSOMNIA, HX OF 05/23/2007  . Joint pain   . LATERAL EPICONDYLITIS, RIGHT 03/10/2009  . LIPOMA 10/22/2010  . NECK PAIN 11/10/2010  . OBESITY 05/23/2007  . Osteopenia 09/28/2018  . Plantar fasciitis    Both feet  . SHOULDER PAIN, LEFT 05/17/2010  . SINUSITIS- ACUTE-NOS 11/03/2008  . SKIN LESION 11/10/2010  . Sleep apnea   . Stage III chronic kidney disease ([place])   . Unspecified Chest Pain 07/25/2008  . UNSPECIFIED URTICARIA 06/04/2009  . URI 08/28/2009  . UTI 04/29/2008  . VITAMIN D DEFICIENCY 08/28/2009  . Wheezing 07/12/2010    PAST SURGICAL HISTORY: Past Surgical History:  Procedure Laterality Date  . COLONOSCOPY    . ENDOMETRIAL ABLATION    . LAMINECTOMY  1996  . LAPAROTOMY     exploratory  . lower back surgery    . POLYPECTOMY    . s/p endometrial ablation  12/06  . TUBAL LIGATION    . UTERINE FIBROID SURGERY    . WISDOM TOOTH EXTRACTION      SOCIAL HISTORY: Social History   Tobacco Use  . Smoking status: Former Smoker    Packs/day: 0.50    Types: Cigarettes    Last attempt to quit: 11/05/1987    Years since quitting: 30.9  . Smokeless tobacco: Never Used  Substance Use Topics  . Alcohol use: No    Alcohol/week: 1.0 standard drinks    Types: 1 Glasses of wine per week    Comment: WINE  . Drug use: No    FAMILY HISTORY: Family History  Problem Relation Age of Onset  . Diabetes Cousin   . Hypertension Cousin   . Heart disease Cousin   . Heart disease Mother        Rheumatic heart disease  . Depression Mother   . COPD Father   . Hypertension Father   . Cancer Maternal Grandmother        Breast  . Diabetes Sister   . Kidney disease Sister     ROS: Review of Systems  Constitutional: Positive for weight loss.  Genitourinary: Negative for frequency.  Endo/Heme/Allergies: Negative for polydipsia.       Negative  for  hypoglycemia  Psychiatric/Behavioral: Positive for depression. Negative for suicidal ideas.    PHYSICAL EXAM: Blood pressure 110/72, pulse 85, temperature 98 F (36.7 C), temperature source Oral, height 5\' 4"  (1.626 m), weight 227 lb (103 kg), SpO2 97 %. Body mass index is 38.96 kg/m. Physical Exam Vitals signs reviewed.  Constitutional:      Appearance: Normal appearance. She is well-developed. She is obese.  Cardiovascular:     Rate and Rhythm: Normal rate.  Pulmonary:     Effort: Pulmonary effort is normal.  Musculoskeletal: Normal range of motion.  Skin:    General: Skin is warm and dry.  Neurological:     Mental Status: She is alert and oriented to person, place, and time.  Psychiatric:        Mood and Affect: Mood normal.        Behavior: Behavior normal.        Thought Content: Thought content does not include homicidal or suicidal ideation.     RECENT LABS AND TESTS: BMET    Component Value Date/Time   NA 141 09/28/2018 1519   NA 140 09/24/2018 1050   K 4.1 09/28/2018 1519   CL 102 09/28/2018 1519   CO2 30 09/28/2018 1519   GLUCOSE 100 (H) 09/28/2018 1519   BUN 27 (H) 09/28/2018 1519   BUN 24 09/24/2018 1050   CREATININE 1.44 (H) 09/28/2018 1519   CREATININE 0.97 09/17/2015 1113   CALCIUM 10.3 09/28/2018 1519   GFRNONAA 39 (L) 09/24/2018 1050   GFRAA 45 (L) 09/24/2018 1050   Lab Results  Component Value Date   HGBA1C 5.6 09/28/2018   HGBA1C 5.6 08/09/2018   HGBA1C 5.7 (A) 05/04/2018   HGBA1C 6.0 09/27/2017   HGBA1C 5.9 03/21/2017   Lab Results  Component Value Date   INSULIN 17.9 08/09/2018   CBC    Component Value Date/Time   WBC 8.9 09/28/2018 1519   RBC 4.50 09/28/2018 1519   HGB 13.3 09/28/2018 1519   HCT 39.4 09/28/2018 1519   PLT 282.0 09/28/2018 1519   MCV 87.6 09/28/2018 1519   MCH 31.4 11/18/2016 1554   MCHC 33.9 09/28/2018 1519   RDW 13.2 09/28/2018 1519   LYMPHSABS 2.7 09/28/2018 1519   MONOABS 0.8 09/28/2018 1519   EOSABS  0.1 09/28/2018 1519   BASOSABS 0.1 09/28/2018 1519   Iron/TIBC/Ferritin/ %Sat    Component Value Date/Time   IRON 55 03/21/2017 0935   Lipid Panel     Component Value Date/Time   CHOL 131 09/28/2018 1519   TRIG 84.0 09/28/2018 1519   HDL 77.70 09/28/2018 1519   CHOLHDL 2 09/28/2018 1519   VLDL 16.8 09/28/2018 1519   LDLCALC 37 09/28/2018 1519   Hepatic Function Panel     Component Value Date/Time   PROT 7.9 09/28/2018 1519   PROT 6.7 09/24/2018 1050   ALBUMIN 4.7 09/28/2018 1519   ALBUMIN 4.4 09/24/2018 1050   AST 17 09/28/2018 1519   ALT 17 09/28/2018 1519   ALKPHOS 103 09/28/2018 1519   BILITOT 0.4 09/28/2018 1519   BILITOT 0.5 09/24/2018 1050   BILIDIR 0.2 09/28/2018 1519      Component Value Date/Time   TSH 3.29 09/28/2018 1519   TSH 2.800 09/24/2018 1050   TSH 0.132 (L) 08/09/2018 1003     Ref. Range 09/24/2018 10:50  Vitamin D, 25-Hydroxy Latest Ref Range: 30.0 - 100.0 ng/mL 35.7     OBESITY BEHAVIORAL INTERVENTION VISIT  Today's visit was # 6  Starting weight: 241 lbs Starting date: 08/09/2018 Today's weight : 227 lbs Today's date: 10/24/2018 Total lbs lost to date: 14   ASK: We discussed the diagnosis of obesity with Suzanne Hansen today and Suzanne Hansen agreed to give [place] permission to discuss obesity behavioral modification therapy today.  ASSESS: Suzanne Hansen has the diagnosis of obesity and her BMI today is 38.95 Suzanne Hansen is in the action stage of change   ADVISE: Suzanne Hansen was educated on the multiple health risks of obesity as well as the benefit of weight loss to improve her health. She was advised of the need for long term treatment and the importance of lifestyle modifications to improve her current health and to decrease her risk of future health problems.  AGREE: Multiple dietary modification options and treatment options were discussed and  Azuree agreed to follow the recommendations documented in the above note.  ARRANGE: Suzanne Hansen was  educated on the importance of frequent visits to treat obesity as outlined per CMS and USPSTF guidelines and agreed to schedule her next follow up appointment today.  Corey Skains, am acting as Location manager for [employer]. Owens Shark, DO  I have reviewed the above documentation for accuracy and completeness, and I agree with the above. -Jearld Lesch, DO

## 2018-10-29 ENCOUNTER — Encounter (INDEPENDENT_AMBULATORY_CARE_PROVIDER_SITE_OTHER): Payer: Self-pay | Admitting: Bariatrics

## 2018-10-29 DIAGNOSIS — M1990 Unspecified osteoarthritis, unspecified site: Secondary | ICD-10-CM | POA: Insufficient documentation

## 2018-10-29 NOTE — Progress Notes (Signed)
[name] [employer], [place] [address] Phone: [phone] Subjective:    I Suzanne Hansen am serving as a Education administrator for Dr. Hulan Saas.  CC: Neck pain and arm weakness follow-up  [id number]  Suzanne Hansen is a 63 y.o. female coming in with complaint of neck pain and arm weakness.  Seen 1 week ago.  Had significant weakness in the C6 the pain distribution on the right side.  Patient was to start prednisone, gabapentin, icing regimen.  X-rays were ordered and independently visualized by me for finding severe osteoarthritic changes.  Patient states she is feeling a little better. Has been having sharp head and eye pain. Right arm is still tingling.       Past Medical History:  Diagnosis Date  . ALLERGIC RHINITIS 05/09/2008  . Allergy   . ANXIETY 05/23/2007  . ARM PAIN, LEFT 10/23/2009  . Arthritis   . Asthma   . ASTHMA 05/23/2007  . Back pain   . Cardiomyopathy ([place]) 09/08/2015  . Chest pain   . CHF (congestive heart failure) ([place])   . Constipation   . CONTACT DERMATITIS 06/09/2009  . Diabetes mellitus   . ENDOMETRIOSIS NOS 05/23/2007  . FACIAL PAIN 06/02/2010  . Food allergy   . FREQUENCY, URINARY 05/17/2010  . GLUCOSE INTOLERANCE 08/28/2009  . HERPES SIMPLEX, UNCOMPLICATED 2/95/2841  . Hyperlipidemia 08/28/2015  . HYPERTHYROIDISM 05/23/2007  . Hypoactive thyroid   . HYPOTHYROIDISM 05/09/2008  . Impaired glucose tolerance 01/28/2011  . INSOMNIA, HX OF 05/23/2007  . Joint pain   . LATERAL EPICONDYLITIS, RIGHT 03/10/2009  . LIPOMA 10/22/2010  . NECK PAIN 11/10/2010  . OBESITY 05/23/2007  . Osteopenia 09/28/2018  . Plantar fasciitis    Both feet  . SHOULDER PAIN, LEFT 05/17/2010  . SINUSITIS- ACUTE-NOS 11/03/2008  . SKIN LESION 11/10/2010  . Sleep apnea   . Stage III chronic kidney disease ([place])   . Unspecified Chest Pain 07/25/2008  . UNSPECIFIED URTICARIA 06/04/2009  . URI 08/28/2009  . UTI 04/29/2008  . VITAMIN D DEFICIENCY 08/28/2009    . Wheezing 07/12/2010   Past Surgical History:  Procedure Laterality Date  . COLONOSCOPY    . ENDOMETRIAL ABLATION    . LAMINECTOMY  1996  . LAPAROTOMY     exploratory  . lower back surgery    . POLYPECTOMY    . s/p endometrial ablation  12/06  . TUBAL LIGATION    . UTERINE FIBROID SURGERY    . WISDOM TOOTH EXTRACTION     Social History   Socioeconomic History  . Marital status: Divorced    Spouse name: Not on file  . Number of children: 3  . Years of education: Not on file  . Highest education level: Not on file  Occupational History  . Occupation: SOCIAL WORKER    Employer: [employer]    Comment: [employer]  . Financial resource strain: Not on file  . Food insecurity:    Worry: Not on file    Inability: Not on file  . Transportation needs:    Medical: Not on file    Non-medical: Not on file  Tobacco Use  . Smoking status: Former Smoker    Packs/day: 0.50    Types: Cigarettes    Last attempt to quit: 11/05/1987    Years since quitting: 31.0  . Smokeless tobacco: Never Used  Substance and Sexual Activity  . Alcohol use: No    Alcohol/week: 1.0  standard drinks    Types: 1 Glasses of wine per week    Comment: WINE  . Drug use: No  . Sexual activity: Not on file  Lifestyle  . Physical activity:    Days per week: Not on file    Minutes per session: Not on file  . Stress: Not on file  Relationships  . Social connections:    Talks on phone: Not on file    Gets together: Not on file    Attends religious service: Not on file    Active member of club or organization: Not on file    Attends meetings of clubs or organizations: Not on file    Relationship status: Not on file  Other Topics Concern  . Not on file  Social History Narrative  . Not on file   Allergies  Allergen Reactions  . Ivp Dye [Iodinated Diagnostic Agents] Shortness Of Breath and Other (See Comments)    Swelling of extremity of the injection site  . Latex  Itching    ITCHING AND COLD SORES AROUND MOUTH WHEN LATEX GLOVES USED BY THE DENTIST/HYGENTIST  . Clarithromycin Nausea And Vomiting    REACTION: nausea  vomiting  . Iodine   . Lisinopril   . Metronidazole Nausea And Vomiting  . Shellfish Allergy   . Zostavax [Zoster Vaccine Live]    Family History  Problem Relation Age of Onset  . Diabetes Cousin   . Hypertension Cousin   . Heart disease Cousin   . Heart disease Mother        Rheumatic heart disease  . Depression Mother   . COPD Father   . Hypertension Father   . Cancer Maternal Grandmother        Breast  . Diabetes Sister   . Kidney disease Sister     Current Outpatient Medications (Endocrine & Metabolic):  .  levothyroxine (SYNTHROID, LEVOTHROID) 100 MCG tablet, Take 1 tablet (100 mcg total) by mouth daily before breakfast. .  predniSONE (DELTASONE) 50 MG tablet, Take 1 tablet (50 mg total) by mouth daily.  Current Outpatient Medications (Cardiovascular):  .  losartan (COZAAR) 25 MG tablet, Take 25 mg by mouth daily. .  rosuvastatin (CRESTOR) 10 MG tablet, TAKE ONE TABLET BY MOUTH DAILY .  torsemide (DEMADEX) 20 MG tablet, 40mg  in the morning and 40mg  evening time.  Current Outpatient Medications (Respiratory):  .  albuterol (PROVENTIL HFA;VENTOLIN HFA) 108 (90 Base) MCG/ACT inhaler, Inhale 2 puffs into the lungs every 4 (four) hours as needed for wheezing or shortness of breath. .  budesonide-formoterol (SYMBICORT) 160-4.5 MCG/ACT inhaler, INHALE 2 PUFFS INTO THE LUNGS TWICE DAILY .  fexofenadine (ALLEGRA) 180 MG tablet, Take 180 mg by mouth daily.   .  fluticasone (FLONASE) 50 MCG/ACT nasal spray, Place 2 sprays into both nostrils daily.  Current Outpatient Medications (Analgesics):  .  allopurinol (ZYLOPRIM) 100 MG tablet, Take 200 mg by mouth daily.  [name]  aspirin 81 MG EC tablet, Take 81 mg by mouth daily.    Current Outpatient Medications (Hematological):  [name]  Cyanocobalamin (VITAMIN B-12) 1000 MCG SUBL, Place 1  tablet (1,000 mcg total) under the tongue daily.  Current Outpatient Medications (Other):  [name]  buPROPion (WELLBUTRIN SR) 100 MG 12 hr tablet, Take 1 tablet (100 mg total) by mouth daily. .  Cholecalciferol (VITAMIN D) 50 MCG (2000 UT) CAPS, Take by mouth. .  gabapentin (NEURONTIN) 100 MG capsule, Take 1 capsule (100 mg total) by mouth 2 (two) times daily. [name]  gabapentin (NEURONTIN) 300 MG capsule, TAKE 1 CAPSULE BY MOUTH THREE TIMES A DAY .  KLOR-CON M20 20 MEQ tablet, TAKE 2 TABLETS (40 MEQ TOTAL) BY MOUTH DAILY .  Liniments (BLUE-EMU SUPER STRENGTH) CREA, Apply 1 Dose topically 2 (two) times daily. .  Multiple Vitamin (MULTIVITAMIN) capsule, Take 1 capsule by mouth daily.   [name]  omeprazole (PRILOSEC) 20 MG capsule, Take 20 mg by mouth as needed. .  valACYclovir (VALTREX) 1000 MG tablet, Take 500 mg by mouth daily. .  vitamin C (ASCORBIC ACID) 500 MG tablet, Take 1 tablet (500 mg total) by mouth daily.    Past medical history, social, surgical and family history all reviewed in electronic medical record.  No pertanent information unless stated regarding to the chief complaint.   Review of Systems:  No headache, visual changes, nausea, vomiting, diarrhea, constipation, dizziness, abdominal pain, skin rash, fevers, chills, night sweats, weight loss, swollen lymph nodes, body aches, joint swelling, muscle aches, chest pain, shortness of breath, mood changes.   Objective  Blood pressure 104/70, height 5\' 4"  (1.626 m), weight 232 lb (105.2 kg).    General: No apparent distress alert and oriented x3 mood and affect normal, dressed appropriately.  HEENT: Pupils equal, extraocular movements intact  Respiratory: Patient's speak in full sentences and does not appear short of breath  Cardiovascular: No lower extremity edema, non tender, no erythema  Skin: Warm dry intact with no signs of infection or rash on extremities or on axial skeleton.  Abdomen: Soft nontender  Neuro: Cranial nerves II through  XII are intact, neurovascularly intact in all extremities with  2+ pulses.  Lymph: No lymphadenopathy of posterior or anterior cervical chain or axillae bilaterally.  Gait mild antalgic MSK:  Non tender with full range of motion and good stability and symmetric strength and tone of shoulders, elbows, wrist, hip, knee and ankles bilaterally.  Weakness noted on the right upper extremity though noted Neck: Inspection loss of lordosis. No palpable stepoffs. Positive Spurling's maneuver. Range of motion decreased in all planes  Patient continues to have weakness with 2 out of 5 strength of the C6 C8 distribution on the right arm.  Possibly worse.  Deep tendon reflexes decreased as well within tricep and brachioradialis Impression and Recommendations:     The above documentation has been reviewed and is accurate and complete Lyndal Pulley, DO       Note: This dictation was prepared with Dragon dictation along with smaller phrase technology. Any transcriptional errors that result from this process are unintentional.

## 2018-10-30 ENCOUNTER — Ambulatory Visit: Payer: Managed Care, Other (non HMO) | Admitting: Family Medicine

## 2018-10-30 ENCOUNTER — Encounter: Payer: Self-pay | Admitting: Family Medicine

## 2018-10-30 DIAGNOSIS — M5412 Radiculopathy, cervical region: Secondary | ICD-10-CM

## 2018-10-30 DIAGNOSIS — M542 Cervicalgia: Secondary | ICD-10-CM

## 2018-10-30 NOTE — Patient Instructions (Signed)
Good to see you  I am sorry you are still hurting.  We will get MRI of the neck  Call [phone] to schedule Once I look at it e will also schedule the epidural  See me again then 2-3 weeks AFTER the epidural  COntinue everything else you are doing right now

## 2018-10-30 NOTE — Assessment & Plan Note (Signed)
Patient made minimal improvement with the prednisone at this time and if anything the weakness seems to be worsening.  X-rays show degenerative disc disease at multiple levels.  MRI ordered today for further evaluation.  Patient would be a candidate for possible epidural injection.  Has had them previously on her lower back.  Discussed icing regimen and home exercises.  Discussed continuing the gabapentin at the moment.  Follow-up with me after the MRI and to discuss the possibility of surgical intervention versus the possibility of epidurals. Spent  25 minutes with patient face-to-face and had greater than 50% of counseling including as described above in assessment and plan.

## 2018-11-05 ENCOUNTER — Telehealth: Payer: Self-pay | Admitting: Internal Medicine

## 2018-11-05 NOTE — Telephone Encounter (Signed)
Copied from [place] [id number]. Topic: Quick Communication - See Telephone Encounter >> Nov 05, 2018  4:43 PM Bea Graff, NT wrote: CRM for notification. See Telephone encounter for: 11/05/18. Demi with [employer] plan callling and states pt has changed the location for where she would like her MRI done and requesting the order to be sent to [facility]. Fax#: [phone] [place] Donovan. CB#: for [facility]- [phone]

## 2018-11-06 NOTE — Telephone Encounter (Signed)
I am confused, since I am not aware the [facility] office can do MRI.  Please let me know if this is what the patient meant, or maybe she was trying to say she wanted to be referred after her MRI to [facility]

## 2018-11-07 ENCOUNTER — Ambulatory Visit (INDEPENDENT_AMBULATORY_CARE_PROVIDER_SITE_OTHER): Payer: Managed Care, Other (non HMO) | Admitting: Bariatrics

## 2018-11-07 ENCOUNTER — Encounter (INDEPENDENT_AMBULATORY_CARE_PROVIDER_SITE_OTHER): Payer: Self-pay | Admitting: Bariatrics

## 2018-11-07 ENCOUNTER — Other Ambulatory Visit (INDEPENDENT_AMBULATORY_CARE_PROVIDER_SITE_OTHER): Payer: Self-pay

## 2018-11-07 VITALS — BP 120/80 | HR 82 | Temp 98.0°F | Ht 64.0 in | Wt 227.0 lb

## 2018-11-07 DIAGNOSIS — M1990 Unspecified osteoarthritis, unspecified site: Secondary | ICD-10-CM

## 2018-11-07 DIAGNOSIS — Z9189 Other specified personal risk factors, not elsewhere classified: Secondary | ICD-10-CM

## 2018-11-07 DIAGNOSIS — F3289 Other specified depressive episodes: Secondary | ICD-10-CM | POA: Diagnosis not present

## 2018-11-07 DIAGNOSIS — R7303 Prediabetes: Secondary | ICD-10-CM | POA: Diagnosis not present

## 2018-11-07 DIAGNOSIS — K5909 Other constipation: Secondary | ICD-10-CM | POA: Diagnosis not present

## 2018-11-07 DIAGNOSIS — Z6839 Body mass index (BMI) 39.0-39.9, adult: Secondary | ICD-10-CM

## 2018-11-07 MED ORDER — BUPROPION HCL ER (SR) 150 MG PO TB12: 150.0000 mg | ORAL_TABLET | Freq: Every day | ORAL | 0 refills | Status: DC

## 2018-11-07 NOTE — Telephone Encounter (Signed)
Patient is calling to state she would like a call back in regards to her order,  She stated she has a appt schedule for a MRI [facility] tomorrow. please advise

## 2018-11-07 NOTE — Telephone Encounter (Signed)
Spoke to pt. Order faxed to [facility].

## 2018-11-07 NOTE — Telephone Encounter (Signed)
Demi calling back and states that pt is scheduled tomorrow with [facility] for an MRI. Demi states that they can do an MRI. Please advise

## 2018-11-08 DIAGNOSIS — M546 Pain in thoracic spine: Secondary | ICD-10-CM | POA: Insufficient documentation

## 2018-11-08 DIAGNOSIS — R202 Paresthesia of skin: Secondary | ICD-10-CM | POA: Insufficient documentation

## 2018-11-08 DIAGNOSIS — Z9189 Other specified personal risk factors, not elsewhere classified: Secondary | ICD-10-CM | POA: Insufficient documentation

## 2018-11-08 NOTE — Progress Notes (Signed)
Office: [phone]  /  Fax: [phone]   HPI:   Chief Complaint: OBESITY Suzanne Hansen is here to discuss her progress with her obesity treatment plan. She is on the Category 2 plan and is following her eating plan approximately 100 % of the time. She states she is exercising 0 minutes 0 times per week. Else is doing well overall. Her weight is 227 lb (103 kg) today and she has maintained weight over a period of 2 weeks since her last visit. She has lost 14 lbs since starting treatment with [place].  Pre-Diabetes Suzanne Hansen has a diagnosis of prediabetes based on her elevated Hgb A1c and was informed this puts her at greater risk of developing diabetes. Her last A1c was at 5.6 She is not taking medications currently and continues to work on diet and exercise to decrease risk of diabetes. She denies nausea or hypoglycemia.  At risk for diabetes Suzanne Hansen is at higher than average risk for developing diabetes due to her obesity and prediabetes. She currently denies polyuria or polydipsia.  Constipation Suzanne Hansen notes constipation for the last few weeks. She took Senokot last night. This is probably slow transit constipation.   Depression with emotional eating behaviors Suzanne Hansen is currently taking Bupropion at 100 mg (medication not helping as much). Suzanne Hansen is struggling with emotional eating and using food for comfort to the extent that it is negatively impacting her health. She often snacks when she is not hungry. Suzanne Hansen sometimes feels she is out of control and then feels guilty that she made poor food choices. She has been working on behavior modification techniques to help reduce her emotional eating and has been somewhat successful. She shows no sign of suicidal or homicidal ideations.  Depression screen Suzanne Hansen 2/9 09/28/2018 08/09/2018 05/16/2018 09/27/2017 06/14/2017  Decreased Interest 0 2 0 0 0  Down, Depressed, Hopeless 1 1 1  0 0  PHQ - 2 Score 1 3 1  0 0  Altered sleeping - 2 - - -    Tired, decreased energy - 3 - - -  Change in appetite - 1 - - -  Feeling bad or failure about yourself  - 1 - - -  Trouble concentrating - 0 - - -  Moving slowly or fidgety/restless - 2 - - -  Suicidal thoughts - 0 - - -  PHQ-9 Score - 12 - - -  Difficult doing work/chores - Somewhat difficult - - -  Some recent data might be hidden      ASSESSMENT AND PLAN:  Prediabetes  Other constipation  Other depression - with emotional eating - Plan: buPROPion (WELLBUTRIN SR) 150 MG 12 hr tablet  At risk for diabetes mellitus  Class 2 severe obesity with serious comorbidity and body mass index (BMI) of 39.0 to 39.9 in adult, unspecified obesity type (HCC)  PLAN:  Pre-Diabetes Suzanne Hansen will continue to work on weight loss, exercise, increasing lean protein and decreasing simple carbohydrates in her diet to help decrease the risk of diabetes. She was informed that eating too many simple carbohydrates or too many calories at one sitting increases the likelihood of GI side effects. Suzanne Hansen agreed to follow up with [place] as directed to monitor her progress.  Diabetes risk counseling Suzanne Hansen was given extended (15 minutes) diabetes prevention counseling today. She is 63 y.o. female and has risk factors for diabetes including obesity and prediabetes. We discussed intensive lifestyle modifications today with an emphasis on weight loss as well as increasing exercise and decreasing simple carbohydrates in  her diet.  Constipation Suzanne Hansen was informed decrease bowel movement frequency is normal while losing weight, but stools should not be hard or painful. Suzanne Hansen will try OTC Meta-fiber and stool softeners. She will the carb counter (1/2 fiber for the day).  Depression with Emotional Eating Behaviors We discussed behavior modification techniques today to help Suzanne Hansen deal with her emotional eating and depression. She has agreed to increase Wellbutrin SR to 150 mg qd #30 with no refills and follow  up as directed.  Obesity Suzanne Hansen is currently in the action stage of change. As such, her goal is to continue with weight loss efforts She has agreed to follow the Category 2 plan Suzanne Hansen has been instructed to work up to a goal of 150 minutes of combined cardio and strengthening exercise per week for weight loss and overall health benefits. We discussed the following Behavioral Modification Strategies today: increase H2O intake, keeping healthy foods in the home, increasing lean protein intake, decreasing simple carbohydrates, increasing vegetables, work on meal planning and easy cooking plans and emotional eating strategies Fruit sheet was provided to patient today.  Suzanne Hansen has agreed to follow up with our clinic in 2 weeks. She was informed of the importance of frequent follow up visits to maximize her success with intensive lifestyle modifications for her multiple health conditions.  ALLERGIES: Allergies  Allergen Reactions  . Ivp Dye [Iodinated Diagnostic Agents] Shortness Of Breath and Other (See Comments)    Swelling of extremity of the injection site  . Latex Itching    ITCHING AND COLD SORES AROUND MOUTH WHEN LATEX GLOVES USED BY THE DENTIST/HYGENTIST  . Clarithromycin Nausea And Vomiting    REACTION: nausea  vomiting  . Iodine   . Lisinopril   . Metronidazole Nausea And Vomiting  . Shellfish Allergy   . Zostavax [Zoster Vaccine Live]     MEDICATIONS: Current Outpatient Medications on File Prior to Visit  Medication Sig Dispense Refill  . albuterol (PROVENTIL HFA;VENTOLIN HFA) 108 (90 Base) MCG/ACT inhaler Inhale 2 puffs into the lungs every 4 (four) hours as needed for wheezing or shortness of breath. 1 Inhaler 3  . allopurinol (ZYLOPRIM) 100 MG tablet Take 200 mg by mouth daily.     [name] aspirin 81 MG EC tablet Take 81 mg by mouth daily.      . budesonide-formoterol (SYMBICORT) 160-4.5 MCG/ACT inhaler INHALE 2 PUFFS INTO THE LUNGS TWICE DAILY 10.2 g 6  . Cholecalciferol  (VITAMIN D) 50 MCG (2000 UT) CAPS Take by mouth.    . Cyanocobalamin (VITAMIN B-12) 1000 MCG SUBL Place 1 tablet (1,000 mcg total) under the tongue daily.  0  . fexofenadine (ALLEGRA) 180 MG tablet Take 180 mg by mouth daily.      . fluticasone (FLONASE) 50 MCG/ACT nasal spray Place 2 sprays into both nostrils daily. 16 g 6  . gabapentin (NEURONTIN) 100 MG capsule Take 1 capsule (100 mg total) by mouth 2 (two) times daily. 60 capsule 3  . gabapentin (NEURONTIN) 300 MG capsule TAKE 1 CAPSULE BY MOUTH THREE TIMES A DAY 90 capsule 1  . KLOR-CON M20 20 MEQ tablet TAKE 2 TABLETS (40 MEQ TOTAL) BY MOUTH DAILY  3  . levothyroxine (SYNTHROID, LEVOTHROID) 100 MCG tablet Take 1 tablet (100 mcg total) by mouth daily before breakfast. 90 tablet 3  . Liniments (BLUE-EMU SUPER STRENGTH) CREA Apply 1 Dose topically 2 (two) times daily. 1 Bottle 1  . losartan (COZAAR) 25 MG tablet Take 25 mg by mouth daily. Take 1/2  tab po qd    . Multiple Vitamin (MULTIVITAMIN) capsule Take 1 capsule by mouth daily.      [name] omeprazole (PRILOSEC) 20 MG capsule Take 20 mg by mouth as needed.    . predniSONE (DELTASONE) 50 MG tablet Take 1 tablet (50 mg total) by mouth daily. 5 tablet 0  . rosuvastatin (CRESTOR) 10 MG tablet TAKE ONE TABLET BY MOUTH DAILY 180 tablet 1  . torsemide (DEMADEX) 20 MG tablet 40mg  in the morning and 40mg  evening time.    . valACYclovir (VALTREX) 1000 MG tablet Take 500 mg by mouth daily.  0  . vitamin C (ASCORBIC ACID) 500 MG tablet Take 1 tablet (500 mg total) by mouth daily. 30 tablet    No current facility-administered medications on file prior to visit.     PAST MEDICAL HISTORY: Past Medical History:  Diagnosis Date  . ALLERGIC RHINITIS 05/09/2008  . Allergy   . ANXIETY 05/23/2007  . ARM PAIN, LEFT 10/23/2009  . Arthritis   . Asthma   . ASTHMA 05/23/2007  . Back pain   . Cardiomyopathy ([place]) 09/08/2015  . Chest pain   . CHF (congestive heart failure) ([place])   . Constipation   . CONTACT  DERMATITIS 06/09/2009  . Diabetes mellitus   . ENDOMETRIOSIS NOS 05/23/2007  . FACIAL PAIN 06/02/2010  . Food allergy   . FREQUENCY, URINARY 05/17/2010  . GLUCOSE INTOLERANCE 08/28/2009  . HERPES SIMPLEX, UNCOMPLICATED 01/23/6062  . Hyperlipidemia 08/28/2015  . HYPERTHYROIDISM 05/23/2007  . Hypoactive thyroid   . HYPOTHYROIDISM 05/09/2008  . Impaired glucose tolerance 01/28/2011  . INSOMNIA, HX OF 05/23/2007  . Joint pain   . LATERAL EPICONDYLITIS, RIGHT 03/10/2009  . LIPOMA 10/22/2010  . NECK PAIN 11/10/2010  . OBESITY 05/23/2007  . Osteopenia 09/28/2018  . Plantar fasciitis    Both feet  . SHOULDER PAIN, LEFT 05/17/2010  . SINUSITIS- ACUTE-NOS 11/03/2008  . SKIN LESION 11/10/2010  . Sleep apnea   . Stage III chronic kidney disease ([place])   . Unspecified Chest Pain 07/25/2008  . UNSPECIFIED URTICARIA 06/04/2009  . URI 08/28/2009  . UTI 04/29/2008  . VITAMIN D DEFICIENCY 08/28/2009  . Wheezing 07/12/2010    PAST SURGICAL HISTORY: Past Surgical History:  Procedure Laterality Date  . COLONOSCOPY    . ENDOMETRIAL ABLATION    . LAMINECTOMY  1996  . LAPAROTOMY     exploratory  . lower back surgery    . POLYPECTOMY    . s/p endometrial ablation  12/06  . TUBAL LIGATION    . UTERINE FIBROID SURGERY    . WISDOM TOOTH EXTRACTION      SOCIAL HISTORY: Social History   Tobacco Use  . Smoking status: Former Smoker    Packs/day: 0.50    Types: Cigarettes    Last attempt to quit: 11/05/1987    Years since quitting: 31.0  . Smokeless tobacco: Never Used  Substance Use Topics  . Alcohol use: No    Alcohol/week: 1.0 standard drinks    Types: 1 Glasses of wine per week    Comment: WINE  . Drug use: No    FAMILY HISTORY: Family History  Problem Relation Age of Onset  . Diabetes Cousin   . Hypertension Cousin   . Heart disease Cousin   . Heart disease Mother        Rheumatic heart disease  . Depression Mother   . COPD Father   . Hypertension Father   . Cancer Maternal Grandmother  Breast  . Diabetes Sister   . Kidney disease Sister     ROS: Review of Systems  Constitutional: Negative for weight loss.  Gastrointestinal: Positive for constipation. Negative for nausea.  Genitourinary: Negative for frequency.  Endo/Heme/Allergies: Negative for polydipsia.       Negative for hypoglycemia  Psychiatric/Behavioral: Positive for depression. Negative for suicidal ideas.    PHYSICAL EXAM: Blood pressure 120/80, pulse 82, temperature 98 F (36.7 C), temperature source Oral, height 5\' 4"  (1.626 m), weight 227 lb (103 kg), SpO2 91 %. Body mass index is 38.96 kg/m. Physical Exam Vitals signs reviewed.  Constitutional:      Appearance: Normal appearance. She is well-developed. She is obese.  Cardiovascular:     Rate and Rhythm: Normal rate.  Pulmonary:     Effort: Pulmonary effort is normal.  Musculoskeletal: Normal range of motion.  Skin:    General: Skin is warm and dry.  Neurological:     Mental Status: She is alert and oriented to person, place, and time.  Psychiatric:        Mood and Affect: Mood normal.        Behavior: Behavior normal.        Thought Content: Thought content does not include homicidal or suicidal ideation.     RECENT LABS AND TESTS: BMET    Component Value Date/Time   NA 141 09/28/2018 1519   NA 140 09/24/2018 1050   K 4.1 09/28/2018 1519   CL 102 09/28/2018 1519   CO2 30 09/28/2018 1519   GLUCOSE 100 (H) 09/28/2018 1519   BUN 27 (H) 09/28/2018 1519   BUN 24 09/24/2018 1050   CREATININE 1.44 (H) 09/28/2018 1519   CREATININE 0.97 09/17/2015 1113   CALCIUM 10.3 09/28/2018 1519   GFRNONAA 39 (L) 09/24/2018 1050   GFRAA 45 (L) 09/24/2018 1050   Lab Results  Component Value Date   HGBA1C 5.6 09/28/2018   HGBA1C 5.6 08/09/2018   HGBA1C 5.7 (A) 05/04/2018   HGBA1C 6.0 09/27/2017   HGBA1C 5.9 03/21/2017   Lab Results  Component Value Date   INSULIN 17.9 08/09/2018   CBC    Component Value Date/Time   WBC 8.9  09/28/2018 1519   RBC 4.50 09/28/2018 1519   HGB 13.3 09/28/2018 1519   HCT 39.4 09/28/2018 1519   PLT 282.0 09/28/2018 1519   MCV 87.6 09/28/2018 1519   MCH 31.4 11/18/2016 1554   MCHC 33.9 09/28/2018 1519   RDW 13.2 09/28/2018 1519   LYMPHSABS 2.7 09/28/2018 1519   MONOABS 0.8 09/28/2018 1519   EOSABS 0.1 09/28/2018 1519   BASOSABS 0.1 09/28/2018 1519   Iron/TIBC/Ferritin/ %Sat    Component Value Date/Time   IRON 55 03/21/2017 0935   Lipid Panel     Component Value Date/Time   CHOL 131 09/28/2018 1519   TRIG 84.0 09/28/2018 1519   HDL 77.70 09/28/2018 1519   CHOLHDL 2 09/28/2018 1519   VLDL 16.8 09/28/2018 1519   LDLCALC 37 09/28/2018 1519   Hepatic Function Panel     Component Value Date/Time   PROT 7.9 09/28/2018 1519   PROT 6.7 09/24/2018 1050   ALBUMIN 4.7 09/28/2018 1519   ALBUMIN 4.4 09/24/2018 1050   AST 17 09/28/2018 1519   ALT 17 09/28/2018 1519   ALKPHOS 103 09/28/2018 1519   BILITOT 0.4 09/28/2018 1519   BILITOT 0.5 09/24/2018 1050   BILIDIR 0.2 09/28/2018 1519      Component Value Date/Time   TSH 3.29 09/28/2018 1519  TSH 2.800 09/24/2018 1050   TSH 0.132 (L) 08/09/2018 1003   0  Ref. Range 09/24/2018 10:50  Vitamin D, 25-Hydroxy Latest Ref Range: 30.0 - 100.0 ng/mL 35.7     OBESITY BEHAVIORAL INTERVENTION VISIT  Today's visit was # 7   Starting weight: 241 lbs Starting date: 08/09/2018 Today's weight : 227 lbs Today's date: 11/07/2018 Total lbs lost to date: 53   ASK: We discussed the diagnosis of obesity with Suzanne Hansen today and Suzanne Hansen agreed to give [place] permission to discuss obesity behavioral modification therapy today.  ASSESS: Suzanne Hansen has the diagnosis of obesity and her BMI today is 38.95 Suzanne Hansen is in the action stage of change   ADVISE: Suzanne Hansen was educated on the multiple health risks of obesity as well as the benefit of weight loss to improve her health. She was advised of the need for long term treatment and  the importance of lifestyle modifications to improve her current health and to decrease her risk of future health problems.  AGREE: Multiple dietary modification options and treatment options were discussed and  Suzanne Hansen agreed to follow the recommendations documented in the above note.  ARRANGE: Suzanne Hansen was educated on the importance of frequent visits to treat obesity as outlined per CMS and USPSTF guidelines and agreed to schedule her next follow up appointment today.  Suzanne Hansen Skains, am acting as Location manager for [employer]. Owens Shark, DO  I have reviewed the above documentation for accuracy and completeness, and I agree with the above. -Jearld Lesch, DO

## 2018-11-16 ENCOUNTER — Other Ambulatory Visit: Payer: Self-pay | Admitting: Student

## 2018-11-16 DIAGNOSIS — M5412 Radiculopathy, cervical region: Secondary | ICD-10-CM

## 2018-11-20 ENCOUNTER — Telehealth: Payer: Self-pay | Admitting: Neurology

## 2018-11-20 NOTE — Telephone Encounter (Signed)
We have attempted to call the patient 2 times to schedule sleep study. Patient has been unavailable at the phone numbers we have on file and has not returned our calls. At this point we will send a letter asking pt to please contact the sleep lab to schedule their sleep study. If patient calls back we will schedule them for their sleep study. ° °

## 2018-11-21 ENCOUNTER — Encounter (INDEPENDENT_AMBULATORY_CARE_PROVIDER_SITE_OTHER): Payer: Self-pay | Admitting: Bariatrics

## 2018-11-21 ENCOUNTER — Ambulatory Visit (INDEPENDENT_AMBULATORY_CARE_PROVIDER_SITE_OTHER): Payer: Managed Care, Other (non HMO) | Admitting: Bariatrics

## 2018-11-21 VITALS — BP 108/73 | HR 55 | Temp 98.7°F | Ht 64.0 in | Wt 229.6 lb

## 2018-11-21 DIAGNOSIS — R7303 Prediabetes: Secondary | ICD-10-CM | POA: Diagnosis not present

## 2018-11-21 DIAGNOSIS — F3289 Other specified depressive episodes: Secondary | ICD-10-CM | POA: Diagnosis not present

## 2018-11-21 DIAGNOSIS — Z9189 Other specified personal risk factors, not elsewhere classified: Secondary | ICD-10-CM

## 2018-11-21 DIAGNOSIS — Z6839 Body mass index (BMI) 39.0-39.9, adult: Secondary | ICD-10-CM

## 2018-11-21 MED ORDER — TOPIRAMATE 50 MG PO TABS: 50.0000 mg | ORAL_TABLET | Freq: Every day | ORAL | 0 refills | Status: DC

## 2018-11-22 NOTE — Progress Notes (Signed)
Office: [phone]  /  Fax: [phone]   HPI:   Chief Complaint: OBESITY Suzanne Hansen is here to discuss her progress with her obesity treatment plan. She is on the Category 2 plan and is following her eating plan approximately 100 % of the time. She states she is exercising 0 minutes 0 times per week. Suzanne Hansen is doing well overall, but she has been taking prednisone for Right shoulder pain and is up 2 pounds. She does have a bone spur. She is up over 1 pound of water.  Her weight is 229 lb 9.6 oz (104.1 kg) today and has had a weight gain of 2 pounds over a period of 3 weeks since her last visit. She has lost 12 lbs since starting treatment with [place].  Pre-Diabetes Suzanne Hansen has a diagnosis of pre-diabetes based on her elevated Hgb A1c and was informed this puts her at greater risk of developing diabetes. Her last A1c was 5.6 on 08/09/18. She is not taking medications currently and continues to work on diet and exercise to decrease risk of diabetes.   At risk for diabetes Suzanne Hansen is at higher than average risk for developing diabetes due to her pre-diabetes and obesity. She currently denies polyuria or polydipsia.  Depression with emotional eating behaviors Suzanne Hansen was on bupropion 100mg  and was changed to 150mg  at the last visit. She is still on the same dose and it is not working. She is struggling with emotional eating and using food for comfort to the extent that it is negatively impacting her health. She often snacks when she is not hungry. Suzanne Hansen sometimes feels she is out of control and then feels guilty that she made poor food choices. She has been working on behavior modification techniques to help reduce her emotional eating and has been somewhat successful.   ASSESSMENT AND PLAN:  Prediabetes  Other depression - with emotional eating - Plan: topiramate (TOPAMAX) 50 MG tablet  At risk for diabetes mellitus  Class 2 severe obesity with serious comorbidity and body mass  index (BMI) of 39.0 to 39.9 in adult, unspecified obesity type ([place])  PLAN:  Pre-Diabetes Suzanne Hansen will continue to work on weight loss, exercise, and decreasing simple carbohydrates in her diet to help decrease the risk of diabetes. She was informed that eating too many simple carbohydrates or too many calories at one sitting increases the likelihood of GI side effects. Suzanne Hansen agreed to decrease carbohydrates and increase protein. Suzanne Hansen agreed to follow up with [place] as directed to monitor her progress in 2 weeks.  Diabetes risk counseling Suzanne Hansen was given extended (15 minutes) diabetes prevention counseling today. She is 63 y.o. female and has risk factors for diabetes including pre-diabetes and obesity. We discussed intensive lifestyle modifications today with an emphasis on weight loss as well as increasing exercise and decreasing simple carbohydrates in her diet.  Depression with Emotional Eating Behaviors We discussed behavior modification techniques today to help Suzanne Hansen deal with her emotional eating and depression. She has agreed to stop Wellbutrin and to start Topamax 50mg , 1 tablet at night from 7:00 to 8:00 pm. Suzanne Hansen agreed to follow up as directed.  Obesity Suzanne Hansen is currently in the action stage of change. As such, her goal is to continue with weight loss efforts. She has agreed to follow the Category 2 plan with increased protein, water intake, and meal planning. She was given Additional Lunch Options. Suzanne Hansen has been instructed to work up to a goal of 150 minutes of combined cardio and strengthening  exercise per week for weight loss and overall health benefits. We discussed the following Behavioral Modification Strategies today: increasing lean protein intake, decreasing simple carbohydrates, increasing vegetables, no skipping meals, and work on meal planning and easy cooking plans.  Suzanne Hansen has agreed to follow up with our clinic in 2 weeks. She was informed of the  importance of frequent follow up visits to maximize her success with intensive lifestyle modifications for her multiple health conditions.  ALLERGIES: Allergies  Allergen Reactions  . Ivp Dye [Iodinated Diagnostic Agents] Shortness Of Breath and Other (See Comments)    Swelling of extremity of the injection site  . Latex Itching    ITCHING AND COLD SORES AROUND MOUTH WHEN LATEX GLOVES USED BY THE DENTIST/HYGENTIST  . Clarithromycin Nausea And Vomiting    REACTION: nausea  vomiting  . Iodine   . Lisinopril   . Metronidazole Nausea And Vomiting  . Shellfish Allergy   . Zostavax [Zoster Vaccine Live]     MEDICATIONS: Current Outpatient Medications on File Prior to Visit  Medication Sig Dispense Refill  . albuterol (PROVENTIL HFA;VENTOLIN HFA) 108 (90 Base) MCG/ACT inhaler Inhale 2 puffs into the lungs every 4 (four) hours as needed for wheezing or shortness of breath. 1 Inhaler 3  . allopurinol (ZYLOPRIM) 100 MG tablet Take 200 mg by mouth daily.     [name] aspirin 81 MG EC tablet Take 81 mg by mouth daily.      . budesonide-formoterol (SYMBICORT) 160-4.5 MCG/ACT inhaler INHALE 2 PUFFS INTO THE LUNGS TWICE DAILY 10.2 g 6  . buPROPion (WELLBUTRIN SR) 150 MG 12 hr tablet Take 1 tablet (150 mg total) by mouth daily. 30 tablet 0  . Cholecalciferol (VITAMIN D) 50 MCG (2000 UT) CAPS Take by mouth.    . Cyanocobalamin (VITAMIN B-12) 1000 MCG SUBL Place 1 tablet (1,000 mcg total) under the tongue daily.  0  . fexofenadine (ALLEGRA) 180 MG tablet Take 180 mg by mouth daily.      . fluticasone (FLONASE) 50 MCG/ACT nasal spray Place 2 sprays into both nostrils daily. 16 g 6  . gabapentin (NEURONTIN) 100 MG capsule Take 1 capsule (100 mg total) by mouth 2 (two) times daily. 60 capsule 3  . gabapentin (NEURONTIN) 300 MG capsule TAKE 1 CAPSULE BY MOUTH THREE TIMES A DAY 90 capsule 1  . KLOR-CON M20 20 MEQ tablet TAKE 2 TABLETS (40 MEQ TOTAL) BY MOUTH DAILY  3  . levothyroxine (SYNTHROID, LEVOTHROID) 100  MCG tablet Take 1 tablet (100 mcg total) by mouth daily before breakfast. 90 tablet 3  . Liniments (BLUE-EMU SUPER STRENGTH) CREA Apply 1 Dose topically 2 (two) times daily. 1 Bottle 1  . losartan (COZAAR) 25 MG tablet Take 25 mg by mouth daily. Take 1/2 tab po qd    . Multiple Vitamin (MULTIVITAMIN) capsule Take 1 capsule by mouth daily.      [name] omeprazole (PRILOSEC) 20 MG capsule Take 20 mg by mouth as needed.    . predniSONE (DELTASONE) 50 MG tablet Take 1 tablet (50 mg total) by mouth daily. 5 tablet 0  . rosuvastatin (CRESTOR) 10 MG tablet TAKE ONE TABLET BY MOUTH DAILY 180 tablet 1  . torsemide (DEMADEX) 20 MG tablet 40mg  in the morning and 40mg  evening time.    . valACYclovir (VALTREX) 1000 MG tablet Take 500 mg by mouth daily.  0  . vitamin C (ASCORBIC ACID) 500 MG tablet Take 1 tablet (500 mg total) by mouth daily. 30 tablet  No current facility-administered medications on file prior to visit.     PAST MEDICAL HISTORY: Past Medical History:  Diagnosis Date  . ALLERGIC RHINITIS 05/09/2008  . Allergy   . ANXIETY 05/23/2007  . ARM PAIN, LEFT 10/23/2009  . Arthritis   . Asthma   . ASTHMA 05/23/2007  . Back pain   . Cardiomyopathy ([place]) 09/08/2015  . Chest pain   . CHF (congestive heart failure) ([place])   . Constipation   . CONTACT DERMATITIS 06/09/2009  . Diabetes mellitus   . ENDOMETRIOSIS NOS 05/23/2007  . FACIAL PAIN 06/02/2010  . Food allergy   . FREQUENCY, URINARY 05/17/2010  . GLUCOSE INTOLERANCE 08/28/2009  . HERPES SIMPLEX, UNCOMPLICATED 01/26/6221  . Hyperlipidemia 08/28/2015  . HYPERTHYROIDISM 05/23/2007  . Hypoactive thyroid   . HYPOTHYROIDISM 05/09/2008  . Impaired glucose tolerance 01/28/2011  . INSOMNIA, HX OF 05/23/2007  . Joint pain   . LATERAL EPICONDYLITIS, RIGHT 03/10/2009  . LIPOMA 10/22/2010  . NECK PAIN 11/10/2010  . OBESITY 05/23/2007  . Osteopenia 09/28/2018  . Plantar fasciitis    Both feet  . SHOULDER PAIN, LEFT 05/17/2010  . SINUSITIS- ACUTE-NOS 11/03/2008    . SKIN LESION 11/10/2010  . Sleep apnea   . Stage III chronic kidney disease ([place])   . Unspecified Chest Pain 07/25/2008  . UNSPECIFIED URTICARIA 06/04/2009  . URI 08/28/2009  . UTI 04/29/2008  . VITAMIN D DEFICIENCY 08/28/2009  . Wheezing 07/12/2010    PAST SURGICAL HISTORY: Past Surgical History:  Procedure Laterality Date  . COLONOSCOPY    . ENDOMETRIAL ABLATION    . LAMINECTOMY  1996  . LAPAROTOMY     exploratory  . lower back surgery    . POLYPECTOMY    . s/p endometrial ablation  12/06  . TUBAL LIGATION    . UTERINE FIBROID SURGERY    . WISDOM TOOTH EXTRACTION      SOCIAL HISTORY: Social History   Tobacco Use  . Smoking status: Former Smoker    Packs/day: 0.50    Types: Cigarettes    Last attempt to quit: 11/05/1987    Years since quitting: 31.0  . Smokeless tobacco: Never Used  Substance Use Topics  . Alcohol use: No    Alcohol/week: 1.0 standard drinks    Types: 1 Glasses of wine per week    Comment: WINE  . Drug use: No    FAMILY HISTORY: Family History  Problem Relation Age of Onset  . Diabetes Cousin   . Hypertension Cousin   . Heart disease Cousin   . Heart disease Mother        Rheumatic heart disease  . Depression Mother   . COPD Father   . Hypertension Father   . Cancer Maternal Grandmother        Breast  . Diabetes Sister   . Kidney disease Sister     ROS: Review of Systems  Constitutional: Negative for weight loss.  Genitourinary:       Negative for polyuria.  Endo/Heme/Allergies: Negative for polydipsia.  Psychiatric/Behavioral: Positive for depression.    PHYSICAL EXAM: Blood pressure 108/73, pulse (!) 55, temperature 98.7 F (37.1 C), height 5\' 4"  (1.626 m), weight 229 lb 9.6 oz (104.1 kg), SpO2 94 %. Body mass index is 39.41 kg/m. Physical Exam Vitals signs reviewed.  Constitutional:      Appearance: Normal appearance. She is obese.  Cardiovascular:     Rate and Rhythm: Normal rate.  Pulmonary:     Effort: Pulmonary  effort is normal.  Musculoskeletal: Normal range of motion.  Skin:    General: Skin is warm and dry.  Neurological:     Mental Status: She is alert and oriented to person, place, and time.  Psychiatric:        Mood and Affect: Mood normal.        Behavior: Behavior normal.     RECENT LABS AND TESTS: BMET    Component Value Date/Time   NA 141 09/28/2018 1519   NA 140 09/24/2018 1050   K 4.1 09/28/2018 1519   CL 102 09/28/2018 1519   CO2 30 09/28/2018 1519   GLUCOSE 100 (H) 09/28/2018 1519   BUN 27 (H) 09/28/2018 1519   BUN 24 09/24/2018 1050   CREATININE 1.44 (H) 09/28/2018 1519   CREATININE 0.97 09/17/2015 1113   CALCIUM 10.3 09/28/2018 1519   GFRNONAA 39 (L) 09/24/2018 1050   GFRAA 45 (L) 09/24/2018 1050   Lab Results  Component Value Date   HGBA1C 5.6 09/28/2018   HGBA1C 5.6 08/09/2018   HGBA1C 5.7 (A) 05/04/2018   HGBA1C 6.0 09/27/2017   HGBA1C 5.9 03/21/2017   Lab Results  Component Value Date   INSULIN 17.9 08/09/2018   CBC    Component Value Date/Time   WBC 8.9 09/28/2018 1519   RBC 4.50 09/28/2018 1519   HGB 13.3 09/28/2018 1519   HCT 39.4 09/28/2018 1519   PLT 282.0 09/28/2018 1519   MCV 87.6 09/28/2018 1519   MCH 31.4 11/18/2016 1554   MCHC 33.9 09/28/2018 1519   RDW 13.2 09/28/2018 1519   LYMPHSABS 2.7 09/28/2018 1519   MONOABS 0.8 09/28/2018 1519   EOSABS 0.1 09/28/2018 1519   BASOSABS 0.1 09/28/2018 1519   Iron/TIBC/Ferritin/ %Sat    Component Value Date/Time   IRON 55 03/21/2017 0935   Lipid Panel     Component Value Date/Time   CHOL 131 09/28/2018 1519   TRIG 84.0 09/28/2018 1519   HDL 77.70 09/28/2018 1519   CHOLHDL 2 09/28/2018 1519   VLDL 16.8 09/28/2018 1519   LDLCALC 37 09/28/2018 1519   Hepatic Function Panel     Component Value Date/Time   PROT 7.9 09/28/2018 1519   PROT 6.7 09/24/2018 1050   ALBUMIN 4.7 09/28/2018 1519   ALBUMIN 4.4 09/24/2018 1050   AST 17 09/28/2018 1519   ALT 17 09/28/2018 1519   ALKPHOS 103  09/28/2018 1519   BILITOT 0.4 09/28/2018 1519   BILITOT 0.5 09/24/2018 1050   BILIDIR 0.2 09/28/2018 1519      Component Value Date/Time   TSH 3.29 09/28/2018 1519   TSH 2.800 09/24/2018 1050   TSH 0.132 (L) 08/09/2018 1003   Results for YANIN, MUHLESTEIN (MRN [id number]) as of 11/22/2018 12:07  Ref. Range 09/24/2018 10:50  Vitamin D, 25-Hydroxy Latest Ref Range: 30.0 - 100.0 ng/mL 35.7    OBESITY BEHAVIORAL INTERVENTION VISIT  Today's visit was # 8   Starting weight: 241 lbs Starting date: 08/09/18 Today's weight : Weight: 229 lb 9.6 oz (104.1 kg)  Today's date: 11/21/2018 Total lbs lost to date: 12  ASK: We discussed the diagnosis of obesity with Suzanne Hansen today and Suzanne Hansen agreed to give [place] permission to discuss obesity behavioral modification therapy today.  ASSESS: Suzanne Hansen has the diagnosis of obesity and her BMI today is 39.3. Suzanne Hansen is in the action stage of change.  ADVISE: Suzanne Hansen was educated on the multiple health risks of obesity as well as the benefit of weight loss to improve her health.  She was advised of the need for long term treatment and the importance of lifestyle modifications to improve her current health and to decrease her risk of future health problems.  AGREE: Multiple dietary modification options and treatment options were discussed and Suzanne Hansen agreed to follow the recommendations documented in the above note.  ARRANGE: Suzanne Hansen was educated on the importance of frequent visits to treat obesity as outlined per CMS and USPSTF guidelines and agreed to schedule her next follow up appointment today.  I, Marcille Blanco, CMA, am acting as Location manager for [employer]. Owens Shark, DO

## 2018-11-30 ENCOUNTER — Inpatient Hospital Stay: Admission: RE | Admit: 2018-11-30 | Payer: Managed Care, Other (non HMO) | Source: Ambulatory Visit

## 2018-12-06 ENCOUNTER — Ambulatory Visit (INDEPENDENT_AMBULATORY_CARE_PROVIDER_SITE_OTHER): Payer: Managed Care, Other (non HMO) | Admitting: Bariatrics

## 2018-12-06 VITALS — BP 108/73 | HR 78 | Temp 97.8°F | Ht 64.0 in | Wt 228.0 lb

## 2018-12-06 DIAGNOSIS — Z9189 Other specified personal risk factors, not elsewhere classified: Secondary | ICD-10-CM

## 2018-12-06 DIAGNOSIS — F3289 Other specified depressive episodes: Secondary | ICD-10-CM | POA: Diagnosis not present

## 2018-12-06 DIAGNOSIS — E538 Deficiency of other specified B group vitamins: Secondary | ICD-10-CM

## 2018-12-06 DIAGNOSIS — Z6839 Body mass index (BMI) 39.0-39.9, adult: Secondary | ICD-10-CM

## 2018-12-06 DIAGNOSIS — R7303 Prediabetes: Secondary | ICD-10-CM

## 2018-12-06 NOTE — Progress Notes (Signed)
Office: [phone]  /  Fax: [phone]   HPI:   Chief Complaint: OBESITY Suzanne Hansen is here to discuss her progress with her obesity treatment plan. She is on the Category 2 plan and is following her eating plan approximately 100 % of the time. She states she is exercising 0 minutes 0 times per week. Suzanne Hansen is doing well overall. She still struggles with "salty, crunchy" foods; especially chips. Her weight is 228 lb (103.4 kg) today and has had a weight loss of 1 pound over a period of 2 weeks since her last visit. She has lost 13 lbs since starting treatment with [place].  Pre-Diabetes Suzanne Hansen has a diagnosis of prediabetes based on her elevated Hgb A1c and was informed this puts her at greater risk of developing diabetes. Her last A1c was at 5.6 She is not taking medications currently and continues to work on diet and exercise to decrease risk of diabetes. She denies nausea or hypoglycemia.  At risk for diabetes Suzanne Hansen is at higher than average risk for developing diabetes due to her obesity and prediabetes. She currently denies polyuria or polydipsia.  B12 Deficiency Suzanne Hansen has a diagnosis of B12 insufficiency and notes fatigue. This is not a new diagnosis. Suzanne Hansen is not a vegetarian and does not have a previous diagnosis of pernicious anemia. She denies paresthesias.  Depression with emotional eating behaviors Suzanne Hansen is struggling with emotional eating and using food for comfort to the extent that it is negatively impacting her health. She often snacks when she is not hungry. Suzanne Hansen sometimes feels she is out of control and then feels guilty that she made poor food choices. Suzanne Hansen is taking Topamax. She has been working on behavior modification techniques to help reduce her emotional eating and has been somewhat successful. She shows no sign of suicidal or homicidal ideations.  Depression screen [facility] 2/9 09/28/2018 08/09/2018 05/16/2018 09/27/2017 06/14/2017  Decreased Interest  0 2 0 0 0  Down, Depressed, Hopeless 1 1 1  0 0  PHQ - 2 Score 1 3 1  0 0  Altered sleeping - 2 - - -  Tired, decreased energy - 3 - - -  Change in appetite - 1 - - -  Feeling bad or failure about yourself  - 1 - - -  Trouble concentrating - 0 - - -  Moving slowly or fidgety/restless - 2 - - -  Suicidal thoughts - 0 - - -  PHQ-9 Score - 12 - - -  Difficult doing work/chores - Somewhat difficult - - -  Some recent data might be hidden    ASSESSMENT AND PLAN:  Prediabetes  B12 nutritional deficiency  Other depression - with emotional eating  At risk for diabetes mellitus  Class 2 severe obesity with serious comorbidity and body mass index (BMI) of 39.0 to 39.9 in adult, unspecified obesity type (HCC)  PLAN:  Pre-Diabetes Suzanne Hansen will continue to work on weight loss, exercise, increasing lean protein and decreasing simple carbohydrates in her diet to help decrease the risk of diabetes. She was informed that eating too many simple carbohydrates or too many calories at one sitting increases the likelihood of GI side effects. Suzanne Hansen agreed to follow up with [place] as directed to monitor her progress.  Diabetes risk counseling Suzanne Hansen was given extended (15 minutes) diabetes prevention counseling today. She is 63 y.o. female and has risk factors for diabetes including obesity and prediabetes. We discussed intensive lifestyle modifications today with an emphasis on weight loss as well as  increasing exercise and decreasing simple carbohydrates in her diet.  B12 Deficiency Suzanne Hansen will work on increasing B12 rich foods in her diet. Suzanne Hansen will resume taking vitamin B12 supplement 1,000 mcg daily and follow up as directed.  Depression with Emotional Eating Behaviors We discussed behavior modification techniques today to help Suzanne Hansen deal with her emotional eating and depression. She will continue Topamax and follow up as directed.  Obesity Suzanne Hansen is currently in the action stage of  change. As such, her goal is to continue with weight loss efforts She has agreed to follow the Category 2 plan Suzanne Hansen has been instructed to work up to a goal of 150 minutes of combined cardio and strengthening exercise per week for weight loss and overall health benefits. We discussed the following Behavioral Modification Strategies today: increase H2O intake, no skipping meals, keeping healthy foods in the home, increasing lean protein intake and mixing up protein during the day, decreasing simple carbohydrates, increasing vegetables, decrease eating out and work on meal planning and intentional eating  Suzanne Hansen has agreed to follow up with our clinic in 2 weeks. She was informed of the importance of frequent follow up visits to maximize her success with intensive lifestyle modifications for her multiple health conditions.  ALLERGIES: Allergies  Allergen Reactions  . Ivp Dye [Iodinated Diagnostic Agents] Shortness Of Breath and Other (See Comments)    Swelling of extremity of the injection site  . Latex Itching    ITCHING AND COLD SORES AROUND MOUTH WHEN LATEX GLOVES USED BY THE DENTIST/HYGENTIST  . Clarithromycin Nausea And Vomiting    REACTION: nausea  vomiting  . Iodine   . Lisinopril   . Metronidazole Nausea And Vomiting  . Shellfish Allergy   . Zostavax [Zoster Vaccine Live]     MEDICATIONS: Current Outpatient Medications on File Prior to Visit  Medication Sig Dispense Refill  . albuterol (PROVENTIL HFA;VENTOLIN HFA) 108 (90 Base) MCG/ACT inhaler Inhale 2 puffs into the lungs every 4 (four) hours as needed for wheezing or shortness of breath. 1 Inhaler 3  . allopurinol (ZYLOPRIM) 100 MG tablet Take 200 mg by mouth daily.     [name] aspirin 81 MG EC tablet Take 81 mg by mouth daily.      . budesonide-formoterol (SYMBICORT) 160-4.5 MCG/ACT inhaler INHALE 2 PUFFS INTO THE LUNGS TWICE DAILY 10.2 g 6  . buPROPion (WELLBUTRIN SR) 150 MG 12 hr tablet Take 1 tablet (150 mg total) by mouth  daily. 30 tablet 0  . Cholecalciferol (VITAMIN D) 50 MCG (2000 UT) CAPS Take by mouth.    . Cyanocobalamin (VITAMIN B-12) 1000 MCG SUBL Place 1 tablet (1,000 mcg total) under the tongue daily.  0  . fexofenadine (ALLEGRA) 180 MG tablet Take 180 mg by mouth daily.      . fluticasone (FLONASE) 50 MCG/ACT nasal spray Place 2 sprays into both nostrils daily. 16 g 6  . gabapentin (NEURONTIN) 100 MG capsule Take 1 capsule (100 mg total) by mouth 2 (two) times daily. 60 capsule 3  . gabapentin (NEURONTIN) 300 MG capsule TAKE 1 CAPSULE BY MOUTH THREE TIMES A DAY 90 capsule 1  . KLOR-CON M20 20 MEQ tablet TAKE 2 TABLETS (40 MEQ TOTAL) BY MOUTH DAILY  3  . levothyroxine (SYNTHROID, LEVOTHROID) 100 MCG tablet Take 1 tablet (100 mcg total) by mouth daily before breakfast. 90 tablet 3  . Liniments (BLUE-EMU SUPER STRENGTH) CREA Apply 1 Dose topically 2 (two) times daily. 1 Bottle 1  . losartan (COZAAR) 25 MG  tablet Take 25 mg by mouth daily. Take 1/2 tab po qd    . Multiple Vitamin (MULTIVITAMIN) capsule Take 1 capsule by mouth daily.      [name] omeprazole (PRILOSEC) 20 MG capsule Take 20 mg by mouth as needed.    . predniSONE (DELTASONE) 50 MG tablet Take 1 tablet (50 mg total) by mouth daily. 5 tablet 0  . rosuvastatin (CRESTOR) 10 MG tablet TAKE ONE TABLET BY MOUTH DAILY 180 tablet 1  . topiramate (TOPAMAX) 50 MG tablet Take 1 tablet (50 mg total) by mouth at bedtime. 30 tablet 0  . torsemide (DEMADEX) 20 MG tablet 40mg  in the morning and 40mg  evening time.    . valACYclovir (VALTREX) 1000 MG tablet Take 500 mg by mouth daily.  0  . vitamin C (ASCORBIC ACID) 500 MG tablet Take 1 tablet (500 mg total) by mouth daily. 30 tablet    No current facility-administered medications on file prior to visit.     PAST MEDICAL HISTORY: Past Medical History:  Diagnosis Date  . ALLERGIC RHINITIS 05/09/2008  . Allergy   . ANXIETY 05/23/2007  . ARM PAIN, LEFT 10/23/2009  . Arthritis   . Asthma   . ASTHMA 05/23/2007  .  Back pain   . Cardiomyopathy ([place]) 09/08/2015  . Chest pain   . CHF (congestive heart failure) ([place])   . Constipation   . CONTACT DERMATITIS 06/09/2009  . Diabetes mellitus   . ENDOMETRIOSIS NOS 05/23/2007  . FACIAL PAIN 06/02/2010  . Food allergy   . FREQUENCY, URINARY 05/17/2010  . GLUCOSE INTOLERANCE 08/28/2009  . HERPES SIMPLEX, UNCOMPLICATED 2/67/1245  . Hyperlipidemia 08/28/2015  . HYPERTHYROIDISM 05/23/2007  . Hypoactive thyroid   . HYPOTHYROIDISM 05/09/2008  . Impaired glucose tolerance 01/28/2011  . INSOMNIA, HX OF 05/23/2007  . Joint pain   . LATERAL EPICONDYLITIS, RIGHT 03/10/2009  . LIPOMA 10/22/2010  . NECK PAIN 11/10/2010  . OBESITY 05/23/2007  . Osteopenia 09/28/2018  . Plantar fasciitis    Both feet  . SHOULDER PAIN, LEFT 05/17/2010  . SINUSITIS- ACUTE-NOS 11/03/2008  . SKIN LESION 11/10/2010  . Sleep apnea   . Stage III chronic kidney disease ([place])   . Unspecified Chest Pain 07/25/2008  . UNSPECIFIED URTICARIA 06/04/2009  . URI 08/28/2009  . UTI 04/29/2008  . VITAMIN D DEFICIENCY 08/28/2009  . Wheezing 07/12/2010    PAST SURGICAL HISTORY: Past Surgical History:  Procedure Laterality Date  . COLONOSCOPY    . ENDOMETRIAL ABLATION    . LAMINECTOMY  1996  . LAPAROTOMY     exploratory  . lower back surgery    . POLYPECTOMY    . s/p endometrial ablation  12/06  . TUBAL LIGATION    . UTERINE FIBROID SURGERY    . WISDOM TOOTH EXTRACTION      SOCIAL HISTORY: Social History   Tobacco Use  . Smoking status: Former Smoker    Packs/day: 0.50    Types: Cigarettes    Last attempt to quit: 11/05/1987    Years since quitting: 31.1  . Smokeless tobacco: Never Used  Substance Use Topics  . Alcohol use: No    Alcohol/week: 1.0 standard drinks    Types: 1 Glasses of wine per week    Comment: WINE  . Drug use: No    FAMILY HISTORY: Family History  Problem Relation Age of Onset  . Diabetes Cousin   . Hypertension Cousin   . Heart disease Cousin   . Heart disease  Mother  Rheumatic heart disease  . Depression Mother   . COPD Father   . Hypertension Father   . Cancer Maternal Grandmother        Breast  . Diabetes Sister   . Kidney disease Sister     ROS: Review of Systems  Constitutional: Positive for weight loss.  Gastrointestinal: Negative for nausea.  Genitourinary: Negative for frequency.  Neurological: Negative for tingling.  Endo/Heme/Allergies: Negative for polydipsia.       Negative for hypoglycemia  Psychiatric/Behavioral: Positive for depression. Negative for suicidal ideas.    PHYSICAL EXAM: Blood pressure 108/73, pulse 78, temperature 97.8 F (36.6 C), temperature source Oral, height 5\' 4"  (1.626 m), weight 228 lb (103.4 kg), SpO2 94 %. Body mass index is 39.14 kg/m. Physical Exam Vitals signs reviewed.  Constitutional:      Appearance: Normal appearance. She is well-developed. She is obese.  Cardiovascular:     Rate and Rhythm: Normal rate.  Pulmonary:     Effort: Pulmonary effort is normal.  Musculoskeletal: Normal range of motion.  Skin:    General: Skin is warm and dry.  Neurological:     Mental Status: She is alert and oriented to person, place, and time.  Psychiatric:        Mood and Affect: Mood normal.        Behavior: Behavior normal.        Thought Content: Thought content does not include homicidal or suicidal ideation.     RECENT LABS AND TESTS: BMET    Component Value Date/Time   NA 141 09/28/2018 1519   NA 140 09/24/2018 1050   K 4.1 09/28/2018 1519   CL 102 09/28/2018 1519   CO2 30 09/28/2018 1519   GLUCOSE 100 (H) 09/28/2018 1519   BUN 27 (H) 09/28/2018 1519   BUN 24 09/24/2018 1050   CREATININE 1.44 (H) 09/28/2018 1519   CREATININE 0.97 09/17/2015 1113   CALCIUM 10.3 09/28/2018 1519   GFRNONAA 39 (L) 09/24/2018 1050   GFRAA 45 (L) 09/24/2018 1050   Lab Results  Component Value Date   HGBA1C 5.6 09/28/2018   HGBA1C 5.6 08/09/2018   HGBA1C 5.7 (A) 05/04/2018   HGBA1C 6.0  09/27/2017   HGBA1C 5.9 03/21/2017   Lab Results  Component Value Date   INSULIN 17.9 08/09/2018   CBC    Component Value Date/Time   WBC 8.9 09/28/2018 1519   RBC 4.50 09/28/2018 1519   HGB 13.3 09/28/2018 1519   HCT 39.4 09/28/2018 1519   PLT 282.0 09/28/2018 1519   MCV 87.6 09/28/2018 1519   MCH 31.4 11/18/2016 1554   MCHC 33.9 09/28/2018 1519   RDW 13.2 09/28/2018 1519   LYMPHSABS 2.7 09/28/2018 1519   MONOABS 0.8 09/28/2018 1519   EOSABS 0.1 09/28/2018 1519   BASOSABS 0.1 09/28/2018 1519   Iron/TIBC/Ferritin/ %Sat    Component Value Date/Time   IRON 55 03/21/2017 0935   Lipid Panel     Component Value Date/Time   CHOL 131 09/28/2018 1519   TRIG 84.0 09/28/2018 1519   HDL 77.70 09/28/2018 1519   CHOLHDL 2 09/28/2018 1519   VLDL 16.8 09/28/2018 1519   LDLCALC 37 09/28/2018 1519   Hepatic Function Panel     Component Value Date/Time   PROT 7.9 09/28/2018 1519   PROT 6.7 09/24/2018 1050   ALBUMIN 4.7 09/28/2018 1519   ALBUMIN 4.4 09/24/2018 1050   AST 17 09/28/2018 1519   ALT 17 09/28/2018 1519   ALKPHOS 103 09/28/2018 1519  BILITOT 0.4 09/28/2018 1519   BILITOT 0.5 09/24/2018 1050   BILIDIR 0.2 09/28/2018 1519      Component Value Date/Time   TSH 3.29 09/28/2018 1519   TSH 2.800 09/24/2018 1050   TSH 0.132 (L) 08/09/2018 1003   Results for EMERI, ESTILL (MRN [id number]) as of 12/06/2018 16:27  Ref. Range 09/24/2018 10:50  Vitamin D, 25-Hydroxy Latest Ref Range: 30.0 - 100.0 ng/mL 35.7     OBESITY BEHAVIORAL INTERVENTION VISIT  Today's visit was # 9   Starting weight: 241 lbs Starting date: 08/09/2018 Today's weight : 228 lbs Today's date: 12/06/2018 Total lbs lost to date: 13    12/06/2018  Height 5\' 4"  (1.626 m)  Weight 228 lb (103.4 kg)  BMI (Calculated) 39.12  BLOOD PRESSURE - SYSTOLIC 938  BLOOD PRESSURE - DIASTOLIC 73   Body Fat % 18.2 %  Total Body Water (lbs) 81.2 lbs    ASK: We discussed the diagnosis of obesity with  Suzanne Hansen today and Suzanne Hansen agreed to give [place] permission to discuss obesity behavioral modification therapy today.  ASSESS: Suzanne Hansen has the diagnosis of obesity and her BMI today is 39.12 Suzanne Hansen is in the action stage of change   ADVISE: Suzanne Hansen was educated on the multiple health risks of obesity as well as the benefit of weight loss to improve her health. She was advised of the need for long term treatment and the importance of lifestyle modifications to improve her current health and to decrease her risk of future health problems.  AGREE: Multiple dietary modification options and treatment options were discussed and  Suzanne Hansen agreed to follow the recommendations documented in the above note.  ARRANGE: Suzanne Hansen was educated on the importance of frequent visits to treat obesity as outlined per CMS and USPSTF guidelines and agreed to schedule her next follow up appointment today.  Corey Skains, am acting as Location manager for [employer]. Owens Shark, DO  I have reviewed the above documentation for accuracy and completeness, and I agree with the above. -Jearld Lesch, DO

## 2018-12-10 ENCOUNTER — Encounter (INDEPENDENT_AMBULATORY_CARE_PROVIDER_SITE_OTHER): Payer: Self-pay | Admitting: Bariatrics

## 2018-12-12 ENCOUNTER — Ambulatory Visit: Payer: Self-pay | Admitting: *Deleted

## 2018-12-12 NOTE — Telephone Encounter (Signed)
Pt refused to speak with a triage nurse. She wanted only to see her provider. Per pt she has wheezing and and some trouble with her ears. Has a hx of CHF. Request an appointment only with her pcp. Scheduled for Monday 12/17/18.  She is on the wait list to see her provider. She stated that if she starts to feel worst, she will go to the ED.

## 2018-12-14 ENCOUNTER — Ambulatory Visit: Payer: Managed Care, Other (non HMO) | Admitting: Family

## 2018-12-17 ENCOUNTER — Ambulatory Visit: Payer: Managed Care, Other (non HMO) | Admitting: Internal Medicine

## 2018-12-17 ENCOUNTER — Ambulatory Visit
Admission: RE | Admit: 2018-12-17 | Discharge: 2018-12-17 | Disposition: A | Discharge status: Home / Self Care | Payer: Managed Care, Other (non HMO) | Source: Ambulatory Visit | Attending: Student | Admitting: Student

## 2018-12-17 ENCOUNTER — Encounter: Payer: Self-pay | Admitting: Internal Medicine

## 2018-12-17 VITALS — BP 116/72 | HR 97 | Temp 97.7°F | Ht 64.0 in | Wt 233.0 lb

## 2018-12-17 DIAGNOSIS — M5412 Radiculopathy, cervical region: Secondary | ICD-10-CM

## 2018-12-17 DIAGNOSIS — J4521 Mild intermittent asthma with (acute) exacerbation: Secondary | ICD-10-CM | POA: Diagnosis not present

## 2018-12-17 DIAGNOSIS — R7303 Prediabetes: Secondary | ICD-10-CM | POA: Diagnosis not present

## 2018-12-17 DIAGNOSIS — J45901 Unspecified asthma with (acute) exacerbation: Secondary | ICD-10-CM | POA: Insufficient documentation

## 2018-12-17 DIAGNOSIS — H6692 Otitis media, unspecified, left ear: Secondary | ICD-10-CM | POA: Diagnosis not present

## 2018-12-17 MED ORDER — GUAIFENESIN ER 600 MG PO TB12: 1200.0000 mg | ORAL_TABLET | Freq: Two times a day (BID) | ORAL | 1 refills | Status: DC | PRN

## 2018-12-17 MED ORDER — TRIAMCINOLONE ACETONIDE 40 MG/ML IJ SUSP (RADIOLOGY)
60.0000 mg | Freq: Once | INTRAMUSCULAR | Status: AC
  Administered 2018-12-17: 60 mg via EPIDURAL

## 2018-12-17 MED ORDER — HYDROCODONE-HOMATROPINE 5-1.5 MG/5ML PO SYRP: 5.0000 mL | ORAL_SOLUTION | Freq: Four times a day (QID) | ORAL | 0 refills | Status: AC | PRN

## 2018-12-17 MED ORDER — PREDNISONE 10 MG PO TABS: ORAL_TABLET | ORAL | 0 refills | Status: DC

## 2018-12-17 MED ORDER — FLUTICASONE-SALMETEROL 250-50 MCG/DOSE IN AEPB: 1.0000 | INHALATION_SPRAY | Freq: Two times a day (BID) | RESPIRATORY_TRACT | 3 refills | Status: DC

## 2018-12-17 MED ORDER — LEVOFLOXACIN 500 MG PO TABS: 500.0000 mg | ORAL_TABLET | Freq: Every day | ORAL | 0 refills | Status: AC

## 2018-12-17 NOTE — Progress Notes (Signed)
Subjective:    Patient ID: Suzanne Hansen, female    DOB: 1956/04/11, 63 y.o.   MRN: [id number]  HPI    Here with 2-3 days acute onset fever, severe left ear pain, pressure, headache, general weakness and malaise, with mild ST and dry hacking cough, but pt denies chest pain, wheezing, increased sob or doe, orthopnea, PND, increased LE swelling, palpitations, dizziness or syncope, except for mild tightness and sob since last PM. Symbicort no longer covered with her insurance and will need change. Pt denies new neurological symptoms such as new headache, or facial or extremity weakness or numbness   Pt denies polydipsia, polyuria Past Medical History:  Diagnosis Date  . ALLERGIC RHINITIS 05/09/2008  . Allergy   . ANXIETY 05/23/2007  . ARM PAIN, LEFT 10/23/2009  . Arthritis   . Asthma   . ASTHMA 05/23/2007  . Back pain   . Cardiomyopathy ([place]) 09/08/2015  . Chest pain   . CHF (congestive heart failure) ([place])   . Constipation   . CONTACT DERMATITIS 06/09/2009  . Diabetes mellitus   . ENDOMETRIOSIS NOS 05/23/2007  . FACIAL PAIN 06/02/2010  . Food allergy   . FREQUENCY, URINARY 05/17/2010  . GLUCOSE INTOLERANCE 08/28/2009  . HERPES SIMPLEX, UNCOMPLICATED 4/69/6295  . Hyperlipidemia 08/28/2015  . HYPERTHYROIDISM 05/23/2007  . Hypoactive thyroid   . HYPOTHYROIDISM 05/09/2008  . Impaired glucose tolerance 01/28/2011  . INSOMNIA, HX OF 05/23/2007  . Joint pain   . LATERAL EPICONDYLITIS, RIGHT 03/10/2009  . LIPOMA 10/22/2010  . NECK PAIN 11/10/2010  . OBESITY 05/23/2007  . Osteopenia 09/28/2018  . Plantar fasciitis    Both feet  . SHOULDER PAIN, LEFT 05/17/2010  . SINUSITIS- ACUTE-NOS 11/03/2008  . SKIN LESION 11/10/2010  . Sleep apnea   . Stage III chronic kidney disease ([place])   . Unspecified Chest Pain 07/25/2008  . UNSPECIFIED URTICARIA 06/04/2009  . URI 08/28/2009  . UTI 04/29/2008  . VITAMIN D DEFICIENCY 08/28/2009  . Wheezing 07/12/2010   Past Surgical History:  Procedure Laterality Date  .  COLONOSCOPY    . ENDOMETRIAL ABLATION    . LAMINECTOMY  1996  . LAPAROTOMY     exploratory  . lower back surgery    . POLYPECTOMY    . s/p endometrial ablation  12/06  . TUBAL LIGATION    . UTERINE FIBROID SURGERY    . WISDOM TOOTH EXTRACTION      reports that she quit smoking about 31 years ago. Her smoking use included cigarettes. She smoked 0.50 packs per day. She has never used smokeless tobacco. She reports that she does not drink alcohol or use drugs. family history includes COPD in her father; Cancer in her maternal grandmother; Depression in her mother; Diabetes in her cousin and sister; Heart disease in her cousin and mother; Hypertension in her cousin and father; Kidney disease in her sister. Allergies  Allergen Reactions  . Ivp Dye [Iodinated Diagnostic Agents] Shortness Of Breath and Other (See Comments)    Swelling of extremity at the injection site  . Clarithromycin Nausea And Vomiting  . Latex Itching and Other (See Comments)    ITCHING AND COLD SORES AROUND MOUTH WHEN LATEX GLOVES USED BY THE DENTIST/HYGENTIST  . Metronidazole Nausea And Vomiting  . Eggs Or Egg-Derived Products   . Lisinopril   . Peanut Oil   . Shellfish Allergy   . Zostavax [Zoster Vaccine Live]    Current Outpatient Medications on File Prior to Visit  Medication Sig Dispense  Refill  . albuterol (PROVENTIL HFA;VENTOLIN HFA) 108 (90 Base) MCG/ACT inhaler Inhale 2 puffs into the lungs every 4 (four) hours as needed for wheezing or shortness of breath. 1 Inhaler 3  . allopurinol (ZYLOPRIM) 100 MG tablet Take 200 mg by mouth daily.     [name] aspirin 81 MG EC tablet Take 81 mg by mouth daily.      . budesonide-formoterol (SYMBICORT) 160-4.5 MCG/ACT inhaler INHALE 2 PUFFS INTO THE LUNGS TWICE DAILY 10.2 g 6  . buPROPion (WELLBUTRIN SR) 150 MG 12 hr tablet Take 1 tablet (150 mg total) by mouth daily. 30 tablet 0  . Cholecalciferol (VITAMIN D) 50 MCG (2000 UT) CAPS Take by mouth.    . Cyanocobalamin (VITAMIN  B-12) 1000 MCG SUBL Place 1 tablet (1,000 mcg total) under the tongue daily.  0  . fexofenadine (ALLEGRA) 180 MG tablet Take 180 mg by mouth daily.      . fluticasone (FLONASE) 50 MCG/ACT nasal spray Place 2 sprays into both nostrils daily. 16 g 6  . gabapentin (NEURONTIN) 100 MG capsule Take 1 capsule (100 mg total) by mouth 2 (two) times daily. 60 capsule 3  . gabapentin (NEURONTIN) 300 MG capsule TAKE 1 CAPSULE BY MOUTH THREE TIMES A DAY 90 capsule 1  . KLOR-CON M20 20 MEQ tablet TAKE 2 TABLETS (40 MEQ TOTAL) BY MOUTH DAILY  3  . levothyroxine (SYNTHROID, LEVOTHROID) 100 MCG tablet Take 1 tablet (100 mcg total) by mouth daily before breakfast. 90 tablet 3  . Liniments (BLUE-EMU SUPER STRENGTH) CREA Apply 1 Dose topically 2 (two) times daily. 1 Bottle 1  . losartan (COZAAR) 25 MG tablet Take 25 mg by mouth daily. Take 1/2 tab po qd    . Multiple Vitamin (MULTIVITAMIN) capsule Take 1 capsule by mouth daily.      [name] omeprazole (PRILOSEC) 20 MG capsule Take 20 mg by mouth as needed.    . rosuvastatin (CRESTOR) 10 MG tablet TAKE ONE TABLET BY MOUTH DAILY 180 tablet 1  . topiramate (TOPAMAX) 50 MG tablet Take 1 tablet (50 mg total) by mouth at bedtime. 30 tablet 0  . torsemide (DEMADEX) 20 MG tablet 40mg  in the morning and 40mg  evening time.    . valACYclovir (VALTREX) 1000 MG tablet Take 500 mg by mouth daily.  0  . vitamin C (ASCORBIC ACID) 500 MG tablet Take 1 tablet (500 mg total) by mouth daily. 30 tablet    No current facility-administered medications on file prior to visit.    Review of Systems  Constitutional: Negative for other unusual diaphoresis or sweats HENT: Negative for ear discharge or swelling Eyes: Negative for other worsening visual disturbances Respiratory: Negative for stridor or other swelling  Gastrointestinal: Negative for worsening distension or other blood Genitourinary: Negative for retention or other urinary change Musculoskeletal: Negative for other MSK pain or  swelling Skin: Negative for color change or other new lesions Neurological: Negative for worsening tremors and other numbness  Psychiatric/Behavioral: Negative for worsening agitation or other fatigue All other system neg per pt    Objective:   Physical Exam BP 116/72   Pulse 97   Temp 97.7 F (36.5 C) (Oral)   Ht 5\' 4"  (1.626 m)   Wt 233 lb (105.7 kg)   SpO2 95%   BMI 39.99 kg/m  VS noted, mild ill Constitutional: Pt appears in NAD HENT: Head: NCAT.  Right Ear: External ear normal.  left tm's with severe erythema, effusion and bulging.  Max sinus areas mild  tender.  Pharynx with mild erythema, no exudate Left Ear: External ear normal.  Eyes: . Pupils are equal, round, and reactive to light. Conjunctivae and EOM are normal Nose: without d/c or deformity Neck: Neck supple. Gross normal ROM Cardiovascular: Normal rate and regular rhythm.   Pulmonary/Chest: Effort normal and breath sounds decreased without rales but with few bilat wheezing.  Neurological: Pt is alert. At baseline orientation, motor grossly intact Skin: Skin is warm. No rashes, other new lesions, no LE edema Psychiatric: Pt behavior is normal without agitation  No other exam findings Lab Results  Component Value Date   WBC 8.9 09/28/2018   HGB 13.3 09/28/2018   HCT 39.4 09/28/2018   PLT 282.0 09/28/2018   GLUCOSE 100 (H) 09/28/2018   CHOL 131 09/28/2018   TRIG 84.0 09/28/2018   HDL 77.70 09/28/2018   LDLCALC 37 09/28/2018   ALT 17 09/28/2018   AST 17 09/28/2018   NA 141 09/28/2018   K 4.1 09/28/2018   CL 102 09/28/2018   CREATININE 1.44 (H) 09/28/2018   BUN 27 (H) 09/28/2018   CO2 30 09/28/2018   TSH 3.29 09/28/2018   INR 1.0 03/21/2017   HGBA1C 5.6 09/28/2018   MICROALBUR <0.7 09/28/2018      Assessment & Plan:

## 2018-12-17 NOTE — Discharge Instructions (Signed)

## 2018-12-17 NOTE — Patient Instructions (Signed)
Please take all new medication as prescribed - the antibiotic, cough medicine, and prednisone, and mucinex  OK to change the Symbicort to Advair generic o/w Please continue all other medications as before  Please have the pharmacy call with any other refills you may need.  Please continue your efforts at being more active, low cholesterol diet, and weight control.  Please keep your appointments with your specialists as you may have planned

## 2018-12-18 ENCOUNTER — Encounter: Payer: Self-pay | Admitting: Podiatry

## 2018-12-18 ENCOUNTER — Ambulatory Visit (INDEPENDENT_AMBULATORY_CARE_PROVIDER_SITE_OTHER): Payer: Managed Care, Other (non HMO)

## 2018-12-18 ENCOUNTER — Ambulatory Visit: Payer: Managed Care, Other (non HMO) | Admitting: Podiatry

## 2018-12-18 VITALS — BP 126/77 | HR 74 | Resp 16

## 2018-12-18 DIAGNOSIS — M722 Plantar fascial fibromatosis: Secondary | ICD-10-CM | POA: Diagnosis not present

## 2018-12-18 NOTE — Assessment & Plan Note (Signed)
stable overall by history and exam, recent data reviewed with pt, and pt to continue medical treatment as before,  to f/u any worsening symptoms or concerns  

## 2018-12-18 NOTE — Assessment & Plan Note (Addendum)
Mild to mod, for antibx course,  mucinex bid prn, to f/u any worsening symptoms or concerns

## 2018-12-18 NOTE — Patient Instructions (Signed)

## 2018-12-18 NOTE — Assessment & Plan Note (Signed)
Mild, for predpac asd, cough med prn, and try change symbicort to advair generic,  to f/u any worsening symptoms or concerns

## 2018-12-18 NOTE — Progress Notes (Signed)
Subjective:  Patient ID: Suzanne Hansen, female    DOB: 11-17-1955,  MRN: [id number] HPI Chief Complaint  Patient presents with  . Foot Pain    Plantar/lateral heel right - aching x 2 weeks, no injury, AM pain and now constant, tried Tylenol arthritis - no help  . New Patient (Initial Visit)    63 y.o. female presents with the above complaint.   ROS: She denies fever chills nausea vomiting muscle aches pains calf pain back pain chest pain shortness of breath.  Past Medical History:  Diagnosis Date  . ALLERGIC RHINITIS 05/09/2008  . Allergy   . ANXIETY 05/23/2007  . ARM PAIN, LEFT 10/23/2009  . Arthritis   . Asthma   . ASTHMA 05/23/2007  . Back pain   . Cardiomyopathy ([place]) 09/08/2015  . Chest pain   . CHF (congestive heart failure) ([place])   . Constipation   . CONTACT DERMATITIS 06/09/2009  . Diabetes mellitus   . ENDOMETRIOSIS NOS 05/23/2007  . FACIAL PAIN 06/02/2010  . Food allergy   . FREQUENCY, URINARY 05/17/2010  . GLUCOSE INTOLERANCE 08/28/2009  . HERPES SIMPLEX, UNCOMPLICATED 4/33/2951  . Hyperlipidemia 08/28/2015  . HYPERTHYROIDISM 05/23/2007  . Hypoactive thyroid   . HYPOTHYROIDISM 05/09/2008  . Impaired glucose tolerance 01/28/2011  . INSOMNIA, HX OF 05/23/2007  . Joint pain   . LATERAL EPICONDYLITIS, RIGHT 03/10/2009  . LIPOMA 10/22/2010  . NECK PAIN 11/10/2010  . OBESITY 05/23/2007  . Osteopenia 09/28/2018  . Plantar fasciitis    Both feet  . SHOULDER PAIN, LEFT 05/17/2010  . SINUSITIS- ACUTE-NOS 11/03/2008  . SKIN LESION 11/10/2010  . Sleep apnea   . Stage III chronic kidney disease ([place])   . Unspecified Chest Pain 07/25/2008  . UNSPECIFIED URTICARIA 06/04/2009  . URI 08/28/2009  . UTI 04/29/2008  . VITAMIN D DEFICIENCY 08/28/2009  . Wheezing 07/12/2010   Past Surgical History:  Procedure Laterality Date  . COLONOSCOPY    . ENDOMETRIAL ABLATION    . LAMINECTOMY  1996  . LAPAROTOMY     exploratory  . lower back surgery    . POLYPECTOMY    . s/p endometrial  ablation  12/06  . TUBAL LIGATION    . UTERINE FIBROID SURGERY    . WISDOM TOOTH EXTRACTION      Current Outpatient Medications:  .  albuterol (PROVENTIL HFA;VENTOLIN HFA) 108 (90 Base) MCG/ACT inhaler, Inhale 2 puffs into the lungs every 4 (four) hours as needed for wheezing or shortness of breath., Disp: 1 Inhaler, Rfl: 3 .  allopurinol (ZYLOPRIM) 100 MG tablet, Take 200 mg by mouth daily. , Disp: , Rfl:  .  aspirin 81 MG EC tablet, Take 81 mg by mouth daily.  , Disp: , Rfl:  .  budesonide-formoterol (SYMBICORT) 160-4.5 MCG/ACT inhaler, INHALE 2 PUFFS INTO THE LUNGS TWICE DAILY, Disp: 10.2 g, Rfl: 6 .  buPROPion (WELLBUTRIN SR) 150 MG 12 hr tablet, Take 1 tablet (150 mg total) by mouth daily., Disp: 30 tablet, Rfl: 0 .  Cholecalciferol (VITAMIN D) 50 MCG (2000 UT) CAPS, Take by mouth., Disp: , Rfl:  .  Cyanocobalamin (VITAMIN B-12) 1000 MCG SUBL, Place 1 tablet (1,000 mcg total) under the tongue daily., Disp: , Rfl: 0 .  fexofenadine (ALLEGRA) 180 MG tablet, Take 180 mg by mouth daily.  , Disp: , Rfl:  .  fluticasone (FLONASE) 50 MCG/ACT nasal spray, Place 2 sprays into both nostrils daily., Disp: 16 g, Rfl: 6 .  Fluticasone-Salmeterol (ADVAIR DISKUS) 250-50 MCG/DOSE AEPB,  Inhale 1 puff into the lungs 2 (two) times daily., Disp: 3 each, Rfl: 3 .  gabapentin (NEURONTIN) 100 MG capsule, Take 1 capsule (100 mg total) by mouth 2 (two) times daily., Disp: 60 capsule, Rfl: 3 .  gabapentin (NEURONTIN) 300 MG capsule, TAKE 1 CAPSULE BY MOUTH THREE TIMES A DAY, Disp: 90 capsule, Rfl: 1 .  guaiFENesin (MUCINEX) 600 MG 12 hr tablet, Take 2 tablets (1,200 mg total) by mouth 2 (two) times daily as needed for to loosen phlegm., Disp: 60 tablet, Rfl: 1 .  HYDROcodone-homatropine (HYCODAN) 5-1.5 MG/5ML syrup, Take 5 mLs by mouth every 6 (six) hours as needed for up to 10 days for cough., Disp: 180 mL, Rfl: 0 .  KLOR-CON M20 20 MEQ tablet, TAKE 2 TABLETS (40 MEQ TOTAL) BY MOUTH DAILY, Disp: , Rfl: 3 .   levofloxacin (LEVAQUIN) 500 MG tablet, Take 1 tablet (500 mg total) by mouth daily for 10 days., Disp: 10 tablet, Rfl: 0 .  levothyroxine (SYNTHROID, LEVOTHROID) 100 MCG tablet, Take 1 tablet (100 mcg total) by mouth daily before breakfast., Disp: 90 tablet, Rfl: 3 .  Liniments (BLUE-EMU SUPER STRENGTH) CREA, Apply 1 Dose topically 2 (two) times daily., Disp: 1 Bottle, Rfl: 1 .  losartan (COZAAR) 25 MG tablet, Take 25 mg by mouth daily. Take 1/2 tab po qd, Disp: , Rfl:  .  Multiple Vitamin (MULTIVITAMIN) capsule, Take 1 capsule by mouth daily.  , Disp: , Rfl:  .  omeprazole (PRILOSEC) 20 MG capsule, Take 20 mg by mouth as needed., Disp: , Rfl:  .  predniSONE (DELTASONE) 10 MG tablet, 2 tabs by mouth per day for 5 days, Disp: 18 tablet, Rfl: 0 .  rosuvastatin (CRESTOR) 10 MG tablet, TAKE ONE TABLET BY MOUTH DAILY, Disp: 180 tablet, Rfl: 1 .  topiramate (TOPAMAX) 50 MG tablet, Take 1 tablet (50 mg total) by mouth at bedtime., Disp: 30 tablet, Rfl: 0 .  torsemide (DEMADEX) 20 MG tablet, 40mg  in the morning and 40mg  evening time., Disp: , Rfl:  .  valACYclovir (VALTREX) 1000 MG tablet, Take 500 mg by mouth daily., Disp: , Rfl: 0 .  vitamin C (ASCORBIC ACID) 500 MG tablet, Take 1 tablet (500 mg total) by mouth daily., Disp: 30 tablet, Rfl:   Allergies  Allergen Reactions  . Ivp Dye [Iodinated Diagnostic Agents] Shortness Of Breath and Other (See Comments)    Swelling of extremity at the injection site  . Clarithromycin Nausea And Vomiting  . Latex Itching and Other (See Comments)    ITCHING AND COLD SORES AROUND MOUTH WHEN LATEX GLOVES USED BY THE DENTIST/HYGENTIST  . Metronidazole Nausea And Vomiting  . Eggs Or Egg-Derived Products   . Lisinopril   . Peanut Oil   . Shellfish Allergy   . Zostavax [Zoster Vaccine Live]    Review of Systems Objective:   Vitals:   12/18/18 0953  BP: 126/77  Pulse: 74  Resp: 16    General: Well developed, nourished, in no acute distress, alert and  oriented x3   Dermatological: Skin is warm, dry and supple bilateral. Nails x 10 are well maintained; remaining integument appears unremarkable at this time. There are no open sores, no preulcerative lesions, no rash or signs of infection present.  Vascular: Dorsalis Pedis artery and Posterior Tibial artery pedal pulses are 2/4 bilateral with immedate capillary fill time. Pedal hair growth present. No varicosities and no lower extremity edema present bilateral.   Neruologic: Grossly intact via light touch bilateral. Vibratory  intact via tuning fork bilateral. Protective threshold with Semmes Wienstein monofilament intact to all pedal sites bilateral. Patellar and Achilles deep tendon reflexes 2+ bilateral. No Babinski or clonus noted bilateral.   Musculoskeletal: No gross boney pedal deformities bilateral. No pain, crepitus, or limitation noted with foot and ankle range of motion bilateral. Muscular strength 5/5 in all groups tested bilateral.  She has pain on palpation medial calcaneal tubercle of the right heel is the majority of her pain is laterally however.  Gait: Unassisted, Nonantalgic.    Radiographs:  Radiographs taken today demonstrate pes planus with soft tissue increase in density plantar fashion calcaneal insertion site of the right heel with a small plantar distally oriented calcaneal heel spur.  Assessment & Plan:   Assessment: Plantar fasciitis with pes planus.  Plan: Discussed etiology pathology and surgical therapy at this point after sterile alcohol prep I injected 20 mg Kenalog 5 mg Marcaine point of maximal tenderness to the right heel.  Posterior and plantar fascial brace discussed appropriate shoe gear stretching exercise and ice therapy.  No oral medication was provided at this time and no night splint was provided.     Max T. [place], [place]

## 2018-12-19 ENCOUNTER — Ambulatory Visit (INDEPENDENT_AMBULATORY_CARE_PROVIDER_SITE_OTHER): Payer: Managed Care, Other (non HMO) | Admitting: Family Medicine

## 2018-12-19 ENCOUNTER — Ambulatory Visit (INDEPENDENT_AMBULATORY_CARE_PROVIDER_SITE_OTHER): Payer: Managed Care, Other (non HMO) | Admitting: Psychology

## 2018-12-25 ENCOUNTER — Telehealth (INDEPENDENT_AMBULATORY_CARE_PROVIDER_SITE_OTHER): Payer: Self-pay | Admitting: Bariatrics

## 2018-12-25 ENCOUNTER — Ambulatory Visit (INDEPENDENT_AMBULATORY_CARE_PROVIDER_SITE_OTHER): Payer: Managed Care, Other (non HMO) | Admitting: Bariatrics

## 2018-12-25 ENCOUNTER — Ambulatory Visit: Payer: Managed Care, Other (non HMO) | Admitting: Sports Medicine

## 2018-12-25 NOTE — Telephone Encounter (Signed)
Patient wants refill of Topiramate 50mg . [facility] [phone]  Please call patient when this has been sent in.  Pt states it was no refills and she was not able to send a message

## 2018-12-26 ENCOUNTER — Other Ambulatory Visit (INDEPENDENT_AMBULATORY_CARE_PROVIDER_SITE_OTHER): Payer: Self-pay | Admitting: Bariatrics

## 2018-12-26 DIAGNOSIS — F3289 Other specified depressive episodes: Secondary | ICD-10-CM

## 2018-12-27 NOTE — Telephone Encounter (Signed)
Prescription sent to the pharmacy. Suzanne Hansen, [place]

## 2019-01-03 ENCOUNTER — Encounter (INDEPENDENT_AMBULATORY_CARE_PROVIDER_SITE_OTHER): Payer: Self-pay

## 2019-01-06 NOTE — Progress Notes (Signed)
[facility] [address] [place], [place] [address] Phone: [phone] Subjective:   I Suzanne Hansen am serving as a Education administrator for Dr. Hulan Saas.   CC: Neck pain follow-up bilateral knee pain  MBT:[id number]  Suzanne Hansen is a 63 y.o. female coming in with complaint of neck pain. Back is a little painful. Bilateral knee pain.  Known significant arthritic changes of the knee.  Recent diagnosis of gout as well.  Started having increasing discomfort in the knees.  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  Still trying to stay active but finds it difficult secondary to the amount of pain in the knees.     Past Medical History:  Diagnosis Date  . ALLERGIC RHINITIS 05/09/2008  . Allergy   . ANXIETY 05/23/2007  . ARM PAIN, LEFT 10/23/2009  . Arthritis   . Asthma   . ASTHMA 05/23/2007  . Back pain   . Cardiomyopathy ([place]) 09/08/2015  . Chest pain   . CHF (congestive heart failure) ([place])   . Constipation   . CONTACT DERMATITIS 06/09/2009  . Diabetes mellitus   . ENDOMETRIOSIS NOS 05/23/2007  . FACIAL PAIN 06/02/2010  . Food allergy   . FREQUENCY, URINARY 05/17/2010  . GLUCOSE INTOLERANCE 08/28/2009  . HERPES SIMPLEX, UNCOMPLICATED 3/64/6803  . Hyperlipidemia 08/28/2015  . HYPERTHYROIDISM 05/23/2007  . Hypoactive thyroid   . HYPOTHYROIDISM 05/09/2008  . Impaired glucose tolerance 01/28/2011  . INSOMNIA, HX OF 05/23/2007  . Joint pain   . LATERAL EPICONDYLITIS, RIGHT 03/10/2009  . LIPOMA 10/22/2010  . NECK PAIN 11/10/2010  . OBESITY 05/23/2007  . Osteopenia 09/28/2018  . Plantar fasciitis    Both feet  . SHOULDER PAIN, LEFT 05/17/2010  . SINUSITIS- ACUTE-NOS 11/03/2008  . SKIN LESION 11/10/2010  . Sleep apnea   . Stage III chronic kidney disease ([place])   . Unspecified Chest Pain 07/25/2008  . UNSPECIFIED URTICARIA 06/04/2009  . URI 08/28/2009  . UTI 04/29/2008  . VITAMIN D DEFICIENCY 08/28/2009  . Wheezing 07/12/2010    Past Surgical History:  Procedure Laterality Date  . COLONOSCOPY    . ENDOMETRIAL ABLATION    . LAMINECTOMY  1996  . LAPAROTOMY     exploratory  . lower back surgery    . POLYPECTOMY    . s/p endometrial ablation  12/06  . TUBAL LIGATION    . UTERINE FIBROID SURGERY    . WISDOM TOOTH EXTRACTION     Social History   Socioeconomic History  . Marital status: Divorced    Spouse name: Not on file  . Number of children: 3  . Years of education: Not on file  . Highest education level: Not on file  Occupational History  . Occupation: SOCIAL WORKER    Employer: [employer]    Comment: [place]  . Financial resource strain: Not on file  . Food insecurity:    Worry: Not on file    Inability: Not on file  . Transportation needs:    Medical: Not on file    Non-medical: Not on file  Tobacco Use  . Smoking status: Former Smoker    Packs/day: 0.50    Types: Cigarettes    Last attempt to quit: 11/05/1987    Years since quitting: 31.1  . Smokeless tobacco: Never Used  Substance and Sexual Activity  . Alcohol use: No    Alcohol/week: 1.0 standard drinks  Types: 1 Glasses of wine per week    Comment: WINE  . Drug use: No  . Sexual activity: Not on file  Lifestyle  . Physical activity:    Days per week: Not on file    Minutes per session: Not on file  . Stress: Not on file  Relationships  . Social connections:    Talks on phone: Not on file    Gets together: Not on file    Attends religious service: Not on file    Active member of club or organization: Not on file    Attends meetings of clubs or organizations: Not on file    Relationship status: Not on file  Other Topics Concern  . Not on file  Social History Narrative  . Not on file   Allergies  Allergen Reactions  . Ivp Dye [Iodinated Diagnostic Agents] Shortness Of Breath and Other (See Comments)    Swelling of extremity at the injection site  . Clarithromycin Nausea And Vomiting  .  Latex Itching and Other (See Comments)    ITCHING AND COLD SORES AROUND MOUTH WHEN LATEX GLOVES USED BY THE DENTIST/HYGENTIST  . Metronidazole Nausea And Vomiting  . Eggs Or Egg-Derived Products   . Lisinopril   . Peanut Oil   . Shellfish Allergy   . Zostavax [Zoster Vaccine Live]    Family History  Problem Relation Age of Onset  . Diabetes Cousin   . Hypertension Cousin   . Heart disease Cousin   . Heart disease Mother        Rheumatic heart disease  . Depression Mother   . COPD Father   . Hypertension Father   . Cancer Maternal Grandmother        Breast  . Diabetes Sister   . Kidney disease Sister     Current Outpatient Medications (Endocrine & Metabolic):  .  levothyroxine (SYNTHROID, LEVOTHROID) 100 MCG tablet, Take 1 tablet (100 mcg total) by mouth daily before breakfast. .  predniSONE (DELTASONE) 10 MG tablet, 2 tabs by mouth per day for 5 days  Current Outpatient Medications (Cardiovascular):  .  losartan (COZAAR) 25 MG tablet, Take 25 mg by mouth daily. Take 1/2 tab po qd .  rosuvastatin (CRESTOR) 10 MG tablet, TAKE ONE TABLET BY MOUTH DAILY .  torsemide (DEMADEX) 20 MG tablet, 40mg  in the morning and 40mg  evening time.  Current Outpatient Medications (Respiratory):  .  albuterol (PROVENTIL HFA;VENTOLIN HFA) 108 (90 Base) MCG/ACT inhaler, Inhale 2 puffs into the lungs every 4 (four) hours as needed for wheezing or shortness of breath. .  budesonide-formoterol (SYMBICORT) 160-4.5 MCG/ACT inhaler, INHALE 2 PUFFS INTO THE LUNGS TWICE DAILY .  fexofenadine (ALLEGRA) 180 MG tablet, Take 180 mg by mouth daily.   .  fluticasone (FLONASE) 50 MCG/ACT nasal spray, Place 2 sprays into both nostrils daily. .  Fluticasone-Salmeterol (ADVAIR DISKUS) 250-50 MCG/DOSE AEPB, Inhale 1 puff into the lungs 2 (two) times daily. [name]  guaiFENesin (MUCINEX) 600 MG 12 hr tablet, Take 2 tablets (1,200 mg total) by mouth 2 (two) times daily as needed for to loosen phlegm.  Current Outpatient  Medications (Analgesics):  .  allopurinol (ZYLOPRIM) 100 MG tablet, Take 200 mg by mouth daily.  [name]  aspirin 81 MG EC tablet, Take 81 mg by mouth daily.    Current Outpatient Medications (Hematological):  [name]  Cyanocobalamin (VITAMIN B-12) 1000 MCG SUBL, Place 1 tablet (1,000 mcg total) under the tongue daily.  Current Outpatient Medications (Other):  .  buPROPion (WELLBUTRIN SR) 150 MG 12 hr tablet, Take 1 tablet (150 mg total) by mouth daily. .  Cholecalciferol (VITAMIN D) 50 MCG (2000 UT) CAPS, Take by mouth. .  gabapentin (NEURONTIN) 100 MG capsule, Take 1 capsule (100 mg total) by mouth 2 (two) times daily. [name]  gabapentin (NEURONTIN) 300 MG capsule, TAKE 1 CAPSULE BY MOUTH THREE TIMES A DAY .  KLOR-CON M20 20 MEQ tablet, TAKE 2 TABLETS (40 MEQ TOTAL) BY MOUTH DAILY .  Liniments (BLUE-EMU SUPER STRENGTH) CREA, Apply 1 Dose topically 2 (two) times daily. .  Multiple Vitamin (MULTIVITAMIN) capsule, Take 1 capsule by mouth daily.   [name]  omeprazole (PRILOSEC) 20 MG capsule, Take 20 mg by mouth as needed. .  topiramate (TOPAMAX) 50 MG tablet, TAKE 1 TABLET(50 MG) BY MOUTH AT BEDTIME .  valACYclovir (VALTREX) 1000 MG tablet, Take 500 mg by mouth daily. .  vitamin C (ASCORBIC ACID) 500 MG tablet, Take 1 tablet (500 mg total) by mouth daily.    Past medical history, social, surgical and family history all reviewed in electronic medical record.  No pertanent information unless stated regarding to the chief complaint.   Review of Systems:  No headache, visual changes, nausea, vomiting, diarrhea, constipation, dizziness, abdominal pain, skin rash, fevers, chills, night sweats, weight loss, swollen lymph nodes, body aches, joint swelling, chest pain, shortness of breath, mood changes.  Positive muscle aches  Objective  Blood pressure 130/80, pulse 90, height 5\' 4"  (1.626 m), weight 225 lb (102.1 kg), SpO2 97 %.     General: No apparent distress alert and oriented x3 mood and affect normal, dressed  appropriately.  HEENT: Pupils equal, extraocular movements intact  Respiratory: Patient's speak in full sentences and does not appear short of breath  Cardiovascular: No lower extremity edema, non tender, no erythema  Skin: Warm dry intact with no signs of infection or rash on extremities or on axial skeleton.  Abdomen: Soft nontender  Neuro: Cranial nerves II through XII are intact, neurovascularly intact in all extremities with 2+ DTRs and 2+ pulses.  Lymph: No lymphadenopathy of posterior or anterior cervical chain or axillae bilaterally.  Gait antalgic.  MSK:  Non tender with full range of motion and good stability and symmetric strength and tone of shoulders, elbows, wrist, hip and ankles bilaterally.  Neck: Inspection mild loss of lordosis. No palpable stepoffs. Negative Spurling's maneuver. Mild limited range of motion with sidebending bilaterally. Grip strength and sensation normal in bilateral hands Strength good C4 to T1 distribution No sensory change to C4 to T1 Negative Hoffman sign bilaterally Reflexes normal Trapezius tightness bilaterally   Knee: Bilateral valgus deformity noted.  Abnormal thigh to calf ratio.  Tender to palpation over medial and PF joint line.  ROM full in flexion and extension and lower leg rotation. instability with valgus force.  painful patellar compression. Patellar glide with moderate crepitus. Patellar and quadriceps tendons unremarkable. Hamstring and quadriceps strength is normal.  After informed written and verbal consent, patient was seated on exam table. Right knee was prepped with alcohol swab and utilizing anterolateral approach, patient's right knee space was injected with 4:1  marcaine 0.5%: Kenalog 40mg /dL. Patient tolerated the procedure well without immediate complications.  After informed written and verbal consent, patient was seated on exam table. Left knee was prepped with alcohol swab and utilizing anterolateral approach,  patient's left knee space was injected with 4:1  marcaine 0.5%: Kenalog 40mg /dL. Patient tolerated the procedure well without immediate complications.   Impression and  Recommendations:     This case required medical decision making of moderate complexity. The above documentation has been reviewed and is accurate and complete Lyndal Pulley, DO       Note: This dictation was prepared with Dragon dictation along with smaller phrase technology. Any transcriptional errors that result from this process are unintentional.

## 2019-01-07 ENCOUNTER — Other Ambulatory Visit: Payer: Self-pay

## 2019-01-07 ENCOUNTER — Ambulatory Visit: Payer: Managed Care, Other (non HMO) | Admitting: Family Medicine

## 2019-01-07 ENCOUNTER — Encounter: Payer: Self-pay | Admitting: Family Medicine

## 2019-01-07 DIAGNOSIS — M170 Bilateral primary osteoarthritis of knee: Secondary | ICD-10-CM | POA: Diagnosis not present

## 2019-01-07 NOTE — Patient Instructions (Signed)
Good to see you  Injected both knees again today  Had been some time which is good.  We need to avoid yo useeing too many docs at the moment Stay safe and healthy See me again in 10 weeks

## 2019-01-07 NOTE — Assessment & Plan Note (Signed)
Bilateral injections given.  Today.  Patient has responded fairly well for a year.  We discussed icing regimen and home exercise, proper shoes and over-the-counter orthotics.  Discussed which activities to do which wants to avoid.  Increase activity as tolerated.  Follow-up again in 4 to 8 weeks

## 2019-01-08 ENCOUNTER — Telehealth: Payer: Self-pay

## 2019-01-08 ENCOUNTER — Encounter: Payer: Self-pay | Admitting: Internal Medicine

## 2019-01-08 NOTE — Telephone Encounter (Signed)
Called pt, LVM  Need to know if pt would like the letter sent to her home address or if she would like to pick it up.

## 2019-01-08 NOTE — Telephone Encounter (Signed)
Letter is located at the front desk

## 2019-01-08 NOTE — Telephone Encounter (Signed)
Pt would like to pick up letter

## 2019-01-15 ENCOUNTER — Other Ambulatory Visit: Payer: Self-pay

## 2019-01-15 ENCOUNTER — Telehealth: Payer: Self-pay | Admitting: *Deleted

## 2019-01-15 ENCOUNTER — Encounter: Payer: Self-pay | Admitting: Podiatry

## 2019-01-15 ENCOUNTER — Ambulatory Visit: Payer: Managed Care, Other (non HMO) | Admitting: Podiatry

## 2019-01-15 VITALS — Temp 98.2°F

## 2019-01-15 DIAGNOSIS — M722 Plantar fascial fibromatosis: Secondary | ICD-10-CM | POA: Diagnosis not present

## 2019-01-15 MED ORDER — IBUPROFEN 600 MG PO TABS: 600.0000 mg | ORAL_TABLET | Freq: Three times a day (TID) | ORAL | 2 refills | Status: DC

## 2019-01-15 NOTE — Progress Notes (Signed)
She presents today for follow-up of her plantar fasciitis of her right foot she states that it may have helped for about 5 minutes that she refers to the injection plus the steroid therapy that her primary provider had given to her.  She states that she recently had an injection for her left knee and has now become more painful feeling that she may have overdone work her left knee as well.  States that she has been wearing her tennis shoes with her orthotics.  Objective: Vital signs are stable alert and oriented x3.  Pulses are palpable.  She has tenderness on palpation medial [place] tubercle of the right heel.  No erythema edema cellulitis drainage or odor.  Assessment: Chronic intractable plantar fasciitis right.  Plan: We will send her to physical therapy.  Injected the right heel again today with [place] and Kenalog with local anesthetic.  Tolerated procedure well without complications.  Prescribed 600 mg ibuprofen with 1 extra strength Tylenol 3 times a day to help alleviate her symptoms.  She will follow-up with me in 1 month to 6 weeks.

## 2019-01-15 NOTE — Telephone Encounter (Signed)
Hand delivered referral to [facility].

## 2019-01-15 NOTE — Telephone Encounter (Signed)
-----   Message from Rip Harbour, [facility] sent at 01/15/2019  2:56 PM EDT ----- Regarding: PT referral PT referral - [facility] in house -  Evaluate and treat for plantar fasciitis   Duration: 3 x week x 4 weeks

## 2019-01-16 ENCOUNTER — Other Ambulatory Visit (INDEPENDENT_AMBULATORY_CARE_PROVIDER_SITE_OTHER): Payer: Self-pay | Admitting: Bariatrics

## 2019-01-16 ENCOUNTER — Encounter (INDEPENDENT_AMBULATORY_CARE_PROVIDER_SITE_OTHER): Payer: Self-pay

## 2019-01-16 DIAGNOSIS — F3289 Other specified depressive episodes: Secondary | ICD-10-CM

## 2019-02-07 ENCOUNTER — Encounter

## 2019-02-07 ENCOUNTER — Other Ambulatory Visit: Payer: Managed Care, Other (non HMO) | Admitting: Orthotics

## 2019-02-18 ENCOUNTER — Ambulatory Visit (INDEPENDENT_AMBULATORY_CARE_PROVIDER_SITE_OTHER): Payer: Managed Care, Other (non HMO) | Admitting: Neurology

## 2019-02-18 ENCOUNTER — Other Ambulatory Visit: Payer: Self-pay

## 2019-02-18 DIAGNOSIS — G4733 Obstructive sleep apnea (adult) (pediatric): Secondary | ICD-10-CM

## 2019-02-18 DIAGNOSIS — N183 Chronic kidney disease, stage 3 unspecified: Secondary | ICD-10-CM

## 2019-02-18 DIAGNOSIS — I5020 Unspecified systolic (congestive) heart failure: Secondary | ICD-10-CM

## 2019-02-18 DIAGNOSIS — E038 Other specified hypothyroidism: Secondary | ICD-10-CM

## 2019-02-18 DIAGNOSIS — G4730 Sleep apnea, unspecified: Secondary | ICD-10-CM

## 2019-02-22 ENCOUNTER — Encounter: Payer: Self-pay | Admitting: Internal Medicine

## 2019-02-22 DIAGNOSIS — E039 Hypothyroidism, unspecified: Secondary | ICD-10-CM

## 2019-02-25 ENCOUNTER — Other Ambulatory Visit (INDEPENDENT_AMBULATORY_CARE_PROVIDER_SITE_OTHER): Payer: Managed Care, Other (non HMO)

## 2019-02-25 ENCOUNTER — Telehealth: Payer: Self-pay | Admitting: Internal Medicine

## 2019-02-25 DIAGNOSIS — E039 Hypothyroidism, unspecified: Secondary | ICD-10-CM | POA: Diagnosis not present

## 2019-02-25 LAB — TSH: TSH: 16.55 u[IU]/mL — ABNORMAL HIGH (ref 0.35–4.50)

## 2019-02-25 LAB — T4, FREE: Free T4: 1.03 ng/dL (ref 0.60–1.60)

## 2019-02-25 NOTE — Telephone Encounter (Signed)
Pt informed of below. See 02/25/19 labs.  She states she is currently taking Levothyroxine 100 mcg qd. Please advise.   Notes recorded by Biagio Borg, MD on 02/25/2019 at 2:37 PM EDT Ok to contact pt; free t4 ok but TSH much higher and makes it appear she is takiing all of her medication  Please ask pt if she takes her current dose thyroid medication every day

## 2019-02-25 NOTE — Telephone Encounter (Signed)
Copied from [place] [phone]. Topic: Quick Communication - See Telephone Encounter >> Feb 25, 2019  4:39 PM Sheran Luz wrote: CRM for notification. See Telephone encounter for: 02/25/19.  Patient calling to check status of lab results from 5/18. Call disconnected in transfer.

## 2019-02-26 MED ORDER — LEVOTHYROXINE SODIUM 112 MCG PO TABS: 112.0000 ug | ORAL_TABLET | Freq: Every day | ORAL | 3 refills | Status: DC

## 2019-02-26 NOTE — Telephone Encounter (Signed)
Ok to increase the thyroid medication to 112 mcg per day, with repeat thyroid testing at the LAB only in 4 wks  Please let pt know

## 2019-02-27 NOTE — Telephone Encounter (Signed)
Pt has been informed and expressed understanding.  

## 2019-03-04 NOTE — Addendum Note (Signed)
Addended by: Larey Seat on: 03/04/2019 06:19 PM   Modules accepted: Orders

## 2019-03-04 NOTE — Procedures (Signed)
Patient Information     First Name: [place] Name: Suzanne Hansen: [id number]  Birth Date: [birth year] Age: 63 Gender: Female  Referring Provider: Cathlean Cower BMI: 40.0 (W=240 lb, H=5' 5'')  Neck Circ.:  16 '' Epworth:  13   Sleep Study Information    Study Date: Feb 19, 2019 S/H/A Version: [id number] / 4.1.1528 / 59  History:   Suzanne Hansen is a 63 y.o. female patient who was originally seen in May 2017 , and is now seen here as a referral from Dentist DR Bennett Scrape, DMD at [facility].   18 September 2018 Suzanne Hansen reports that her cardiologist [name], DO at [facility] in [place] wants her to be reevaluated for sleep apnea.  The patient had been evaluated in an attended split-night polysomnography on 29 Feb 2016 at the time she had a baseline AHI of 17.3 mostly hypercapnia related, REM AHI was 52.6 she did not have oxygen desaturations of significance.  She was titrated to CPAP at 9 cmH2O the AHI became 0.0 at that setting and she tolerated supine positional sleep without further apneas, she also had a significant amount of REM sleep rebounding.   The patient started CPAP late May 2017 at home but she contracted frequent upper respiratory infections, sinusitis and she suffers from frequently congested nose and sinus symptoms.  She felt that CPAP was not comfortable for her to wear and I had never seen her after the trial of CPAP and she decided to go with a dental appliance instead.  Her daughter now tells her that the dental appliance has not eliminated her snoring, as she could hear her mother snore.  Her heart failure specialist is also concerned that the dental appliance does nothing to treat the underlying REM dependent apnea.  Her family has noticed that she continues to have apneas while using the mouthpiece. The mouth piece cannot be adjusted any further. Summary & Diagnosis:     The patient has severe OSA at 34.1/h AHI and in REM accentuated to 45.5/h. Her heart  rate varied between 46 and 114 bpm. This REM dependent apnea will not respond to dental appliances.           Recommendations:      I will order: Auto titration capable CPAP at 5-16 cm water pressure and 3 cm EPR and heated humidity. Consider a mask such as F30I with nasal cradle for this patient.  Electronically Signed: Larey Seat, MD 03-04-2019       Sleep Summary  Oxygen Saturation Statistics   Start Study Time: End Study Time: Total Recording Time:  1:25:24 AM 8:09:25 AM 6 hrs, 29min  Total Sleep Time % REM of Sleep Time:  4 hrs, 55 min 27.2    Mean: 94 Minimum: 90 Maximum: 100  Mean of Desaturations Nadirs (%):   92  Oxygen Desatur. %: 4-9 10-20 >20 Total  Events Number Total  37 100.0  0 0.0  0 0.0  37 100.0  Oxygen Saturation: <90 <=88 <85 <80 <70  Duration (minutes): Sleep % 0.0 0.0 0.0 0.0 0.0 0.0 0.0 0.0 0.0 0.0     Respiratory Indices      Total Events REM NREM All Night  pRDI:  126  pAHI:  126 ODI:  37  pAHIc:  2  % CSR: 0.0 45.5 45.5 19.9 0.0 31.4 31.4 7.7 0.7 34.1 34.1 10.0 0.5       Pulse Rate Statistics during Sleep (BPM)  Mean:  74 Minimum: 46 Maximum: 113    Indices are calculated using technically valid sleep time of  3 hrs, 41 min. pRDI/pAHI are calculated using oxi desaturations ? 3% Sit N/A Body Position Statistics  Position Supine Prone Right Left Non-Supine  Sleep (min) 266.5 0.0 28.0 1.0 29.0  Sleep % 90.2 0.0 9.5 0.3 9.8  pRDI 34.1 N/A 34.3 N/A 34.3  pAHI 34.1 N/A 34.3 N/A 34.3  ODI 8.7 N/A 22.9 N/A 22.9     Snoring Statistics Snoring Level (dB) >40 >50 >60 >70 >80 >Threshold (45)  Sleep (min) 217.9 10.0 1.2 0.3 0.0 53.4  Sleep % 73.7 3.4 0.4 0.1 0.0 18.1    Mean: 43 dB Sleep Stages Chart                                                    pAHI=34.1                                                                 Mild

## 2019-03-05 ENCOUNTER — Encounter: Payer: Self-pay | Admitting: Neurology

## 2019-03-05 ENCOUNTER — Telehealth: Payer: Self-pay | Admitting: Neurology

## 2019-03-05 NOTE — Telephone Encounter (Signed)
Called patient to discuss sleep study results. No answer at this time. LVM for the patient to call back.  Will send a mychart message also.  

## 2019-03-05 NOTE — Telephone Encounter (Signed)
-----   Message from Larey Seat, MD sent at 03/04/2019  6:17 PM EDT ----- Summary & Diagnosis:   The patient has severe OSA at 34.1/h AHI and in REM accentuated  to 45.5/h. Her heart rate varied between 46 and 114 bpm. This REM  dependent apnea will not respond to dental appliances.           Recommendations:    I will order: Auto titration capable CPAP at 5-16 cm water  pressure and 3 cm EPR and heated humidity. Consider a mask such  as F30I with nasal cradle for this patient.  Electronically Signed: Larey Seat, MD 03-04-2019  Myriam Jacobson, please send copy to her cardiologist, Delena Serve, DO, at [facility]

## 2019-03-05 NOTE — Telephone Encounter (Signed)
Pt called back and I advised pt that Dr. Brett Fairy reviewed their sleep study results and found that pt has severe sleep apnea. Dr. Brett Fairy recommends that pt starts auto CPAP. I reviewed PAP compliance expectations with the pt. Pt is agreeable to starting a CPAP. I advised pt that an order will be sent to a DME, [facility], and [facility] will call the pt within about one week after they file with the pt's insurance. [facility] will show the pt how to use the machine, fit for masks, and troubleshoot the CPAP if needed. A follow up appt was made for insurance purposes with Debbora Presto, NP on Aug 3,2020 at 3pm. Pt verbalized understanding to arrive 15 minutes early and bring their CPAP. A letter with all of this information in it will be mailed to the pt as a reminder. I verified with the pt that the address we have on file is correct. Pt verbalized understanding of results. Pt had no questions at this time but was encouraged to call back if questions arise. I have sent the order to [facility] and have received confirmation that they have received the order.

## 2019-03-19 ENCOUNTER — Other Ambulatory Visit (INDEPENDENT_AMBULATORY_CARE_PROVIDER_SITE_OTHER): Payer: Managed Care, Other (non HMO)

## 2019-03-19 DIAGNOSIS — E039 Hypothyroidism, unspecified: Secondary | ICD-10-CM | POA: Diagnosis not present

## 2019-03-19 LAB — T4, FREE: Free T4: 1.41 ng/dL (ref 0.60–1.60)

## 2019-03-19 LAB — TSH: TSH: 0.93 u[IU]/mL (ref 0.35–4.50)

## 2019-03-20 ENCOUNTER — Encounter: Payer: Self-pay | Admitting: Internal Medicine

## 2019-03-20 ENCOUNTER — Other Ambulatory Visit: Payer: Self-pay

## 2019-03-20 ENCOUNTER — Ambulatory Visit (INDEPENDENT_AMBULATORY_CARE_PROVIDER_SITE_OTHER): Payer: Managed Care, Other (non HMO) | Admitting: Internal Medicine

## 2019-03-20 DIAGNOSIS — E038 Other specified hypothyroidism: Secondary | ICD-10-CM

## 2019-03-20 DIAGNOSIS — R252 Cramp and spasm: Secondary | ICD-10-CM

## 2019-03-20 DIAGNOSIS — R7303 Prediabetes: Secondary | ICD-10-CM | POA: Diagnosis not present

## 2019-03-20 DIAGNOSIS — Z0001 Encounter for general adult medical examination with abnormal findings: Secondary | ICD-10-CM

## 2019-03-20 DIAGNOSIS — E785 Hyperlipidemia, unspecified: Secondary | ICD-10-CM

## 2019-03-20 MED ORDER — CYCLOBENZAPRINE HCL 10 MG PO TABS: 10.0000 mg | ORAL_TABLET | Freq: Every evening | ORAL | 3 refills | Status: DC | PRN

## 2019-03-20 MED ORDER — ATORVASTATIN CALCIUM 20 MG PO TABS: 20.0000 mg | ORAL_TABLET | Freq: Every day | ORAL | 3 refills | Status: DC

## 2019-03-20 NOTE — Progress Notes (Signed)
Patient ID: Suzanne Hansen, female   DOB: 1956/10/01, 63 y.o.   MRN: [id number]  Virtual Visit via Video Note  I connected with Suzanne Hansen on 03/20/19 at  2:20 PM EDT by a video enabled telemedicine application and verified that I am speaking with the correct person using two identifiers.  Location: Patient: at home Provider: at office   I discussed the limitations of evaluation and management by telemedicine and the availability of in person appointments. The patient expressed understanding and agreed to proceed.  History of Present Illness: Here for wellness and f/u;  Overall doing ok;  Pt denies Chest pain, worsening SOB, DOE, wheezing, orthopnea, PND, worsening LE edema, palpitations, dizziness or syncope.  Pt denies neurological change such as new headache, facial or extremity weakness.  Pt denies polydipsia, polyuria, or low sugar symptoms. Pt states overall good compliance with treatment and medications, good tolerability, and has been trying to follow appropriate diet.  Pt denies worsening depressive symptoms, suicidal ideation or panic. No fever, night sweats, wt loss, loss of appetite, or other constitutional symptoms.  Pt states good ability with ADL's, has low fall risk, home safety reviewed and adequate, no other significant changes in hearing or vision, and only occasionally active with exercise.   Due for optho f/u and plans to call Also Still having leg cramps on higher dose diuretic and potassium per cardiology.  Saw renal yesterday with stable.  Declines further lab today. TFT's yesterday were ok.  Has renal f/u in Sept 2020 with labs.  Did also see Dr Maureen Chatters with OSA and now on CPAP for the past wk, overall doing much better.  Nothing else seems to make better or worse.   Past Medical History:  Diagnosis Date  . ALLERGIC RHINITIS 05/09/2008  . Allergy   . ANXIETY 05/23/2007  . ARM PAIN, LEFT 10/23/2009  . Arthritis   . Asthma   . ASTHMA 05/23/2007  . Back pain   .  Cardiomyopathy ([place]) 09/08/2015  . Chest pain   . CHF (congestive heart failure) ([place])   . Constipation   . CONTACT DERMATITIS 06/09/2009  . Diabetes mellitus   . ENDOMETRIOSIS NOS 05/23/2007  . FACIAL PAIN 06/02/2010  . Food allergy   . FREQUENCY, URINARY 05/17/2010  . GLUCOSE INTOLERANCE 08/28/2009  . HERPES SIMPLEX, UNCOMPLICATED 2/77/4128  . Hyperlipidemia 08/28/2015  . HYPERTHYROIDISM 05/23/2007  . Hypoactive thyroid   . HYPOTHYROIDISM 05/09/2008  . Impaired glucose tolerance 01/28/2011  . INSOMNIA, HX OF 05/23/2007  . Joint pain   . LATERAL EPICONDYLITIS, RIGHT 03/10/2009  . LIPOMA 10/22/2010  . NECK PAIN 11/10/2010  . OBESITY 05/23/2007  . Osteopenia 09/28/2018  . Plantar fasciitis    Both feet  . SHOULDER PAIN, LEFT 05/17/2010  . SINUSITIS- ACUTE-NOS 11/03/2008  . SKIN LESION 11/10/2010  . Sleep apnea   . Stage III chronic kidney disease ([place])   . Unspecified Chest Pain 07/25/2008  . UNSPECIFIED URTICARIA 06/04/2009  . URI 08/28/2009  . UTI 04/29/2008  . VITAMIN D DEFICIENCY 08/28/2009  . Wheezing 07/12/2010   Past Surgical History:  Procedure Laterality Date  . COLONOSCOPY    . ENDOMETRIAL ABLATION    . LAMINECTOMY  1996  . LAPAROTOMY     exploratory  . lower back surgery    . POLYPECTOMY    . s/p endometrial ablation  12/06  . TUBAL LIGATION    . UTERINE FIBROID SURGERY    . WISDOM TOOTH EXTRACTION      reports that  she quit smoking about 31 years ago. Her smoking use included cigarettes. She smoked 0.50 packs per day. She has never used smokeless tobacco. She reports that she does not drink alcohol or use drugs. family history includes COPD in her father; Cancer in her maternal grandmother; Depression in her mother; Diabetes in her cousin and sister; Heart disease in her cousin and mother; Hypertension in her cousin and father; Kidney disease in her sister. Allergies  Allergen Reactions  . Ivp Dye [Iodinated Diagnostic Agents] Shortness Of Breath and Other (See Comments)     Swelling of extremity at the injection site  . Clarithromycin Nausea And Vomiting  . Latex Itching and Other (See Comments)    ITCHING AND COLD SORES AROUND MOUTH WHEN LATEX GLOVES USED BY THE DENTIST/HYGENTIST  . Metronidazole Nausea And Vomiting  . Eggs Or Egg-Derived Products   . Lisinopril   . Peanut Oil   . Shellfish Allergy   . Zostavax [Zoster Vaccine Live]    Current Outpatient Medications on File Prior to Visit  Medication Sig Dispense Refill  . albuterol (PROVENTIL HFA;VENTOLIN HFA) 108 (90 Base) MCG/ACT inhaler Inhale 2 puffs into the lungs every 4 (four) hours as needed for wheezing or shortness of breath. 1 Inhaler 3  . allopurinol (ZYLOPRIM) 100 MG tablet Take 200 mg by mouth daily.     [name] aspirin 81 MG EC tablet Take 81 mg by mouth daily.      . budesonide-formoterol (SYMBICORT) 160-4.5 MCG/ACT inhaler INHALE 2 PUFFS INTO THE LUNGS TWICE DAILY 10.2 g 6  . buPROPion (WELLBUTRIN SR) 150 MG 12 hr tablet Take 1 tablet (150 mg total) by mouth daily. 30 tablet 0  . Cholecalciferol (VITAMIN D) 50 MCG (2000 UT) CAPS Take by mouth.    . Cyanocobalamin (VITAMIN B-12) 1000 MCG SUBL Place 1 tablet (1,000 mcg total) under the tongue daily.  0  . fexofenadine (ALLEGRA) 180 MG tablet Take 180 mg by mouth daily.      . fluticasone (FLONASE) 50 MCG/ACT nasal spray Place 2 sprays into both nostrils daily. 16 g 6  . Fluticasone-Salmeterol (ADVAIR DISKUS) 250-50 MCG/DOSE AEPB Inhale 1 puff into the lungs 2 (two) times daily. 3 each 3  . gabapentin (NEURONTIN) 100 MG capsule Take 1 capsule (100 mg total) by mouth 2 (two) times daily. 60 capsule 3  . gabapentin (NEURONTIN) 300 MG capsule TAKE 1 CAPSULE BY MOUTH THREE TIMES A DAY 90 capsule 1  . guaiFENesin (MUCINEX) 600 MG 12 hr tablet Take 2 tablets (1,200 mg total) by mouth 2 (two) times daily as needed for to loosen phlegm. 60 tablet 1  . ibuprofen (ADVIL,MOTRIN) 600 MG tablet Take 1 tablet (600 mg total) by mouth 3 (three) times daily. 90  tablet 2  . KLOR-CON M20 20 MEQ tablet TAKE 2 TABLETS (40 MEQ TOTAL) BY MOUTH DAILY  3  . levothyroxine (SYNTHROID) 112 MCG tablet Take 1 tablet (112 mcg total) by mouth daily. 90 tablet 3  . Liniments (BLUE-EMU SUPER STRENGTH) CREA Apply 1 Dose topically 2 (two) times daily. 1 Bottle 1  . losartan (COZAAR) 25 MG tablet Take 25 mg by mouth daily. Take 1/2 tab po qd    . Multiple Vitamin (MULTIVITAMIN) capsule Take 1 capsule by mouth daily.      [name] omeprazole (PRILOSEC) 20 MG capsule Take 20 mg by mouth as needed.    . predniSONE (DELTASONE) 10 MG tablet 2 tabs by mouth per day for 5 days 18 tablet 0  .  rosuvastatin (CRESTOR) 10 MG tablet TAKE ONE TABLET BY MOUTH DAILY 180 tablet 1  . topiramate (TOPAMAX) 50 MG tablet TAKE 1 TABLET(50 MG) BY MOUTH AT BEDTIME 30 tablet 0  . torsemide (DEMADEX) 20 MG tablet 40mg  in the morning and 40mg  evening time.    . valACYclovir (VALTREX) 1000 MG tablet Take 500 mg by mouth daily.  0  . vitamin C (ASCORBIC ACID) 500 MG tablet Take 1 tablet (500 mg total) by mouth daily. 30 tablet    No current facility-administered medications on file prior to visit.    Observations/Objective: Alert, NAD, appropriate mood and affect, resps normal, cn 2-12 intact, moves all 4s, no visible rash or swelling Lab Results  Component Value Date   WBC 8.9 09/28/2018   HGB 13.3 09/28/2018   HCT 39.4 09/28/2018   PLT 282.0 09/28/2018   GLUCOSE 100 (H) 09/28/2018   CHOL 131 09/28/2018   TRIG 84.0 09/28/2018   HDL 77.70 09/28/2018   LDLCALC 37 09/28/2018   ALT 17 09/28/2018   AST 17 09/28/2018   NA 141 09/28/2018   K 4.1 09/28/2018   CL 102 09/28/2018   CREATININE 1.44 (H) 09/28/2018   BUN 27 (H) 09/28/2018   CO2 30 09/28/2018   TSH 0.93 03/19/2019   INR 1.0 03/21/2017   HGBA1C 5.6 09/28/2018   MICROALBUR <0.7 09/28/2018   Assessment and Plan: See notes  Follow Up Instructions: See notes   I discussed the assessment and treatment plan with the patient. The  patient was provided an opportunity to ask questions and all were answered. The patient agreed with the plan and demonstrated an understanding of the instructions.   The patient was advised to call back or seek an in-person evaluation if the symptoms worsen or if the condition fails to improve as anticipated.   Cathlean Cower, MD

## 2019-03-20 NOTE — Assessment & Plan Note (Addendum)
Severe at night, for flexeril 10 qhs prn  In addition to the time spent performing CPE, I spent an additional 25 minutes face to face,in which greater than 50% of this time was spent in counseling and coordination of care for patient's acute illness as documented, including the differential dx, treatment, further evaluation and other management of muscle cramping, HLD, hypothyroidism, and PreDM

## 2019-03-20 NOTE — Assessment & Plan Note (Signed)
stable overall by history and exam, recent data reviewed with pt, and pt to continue medical treatment as before,  to f/u any worsening symptoms or concerns  

## 2019-03-20 NOTE — Assessment & Plan Note (Signed)
Ok to try change crestor to lipitor 20 qd to see if helps with leg cramping

## 2019-03-20 NOTE — Patient Instructions (Signed)
Please take all new medication as prescribed - the lipitor instead of the crestor, and the flexeril at bedtime as needed  Please continue all other medications as before, and refills have been done if requested.  Please have the pharmacy call with any other refills you may need.  Please continue your efforts at being more active, low cholesterol diet, and weight control.  You are otherwise up to date with prevention measures today.  Please keep your appointments with your specialists as you may have planned  Please return in 6 months, or sooner if needed, with Lab testing done 3-5 days before

## 2019-03-20 NOTE — Assessment & Plan Note (Signed)

## 2019-03-21 ENCOUNTER — Telehealth: Payer: Self-pay | Admitting: Internal Medicine

## 2019-03-21 MED ORDER — GABAPENTIN 100 MG PO CAPS: 100.0000 mg | ORAL_CAPSULE | Freq: Two times a day (BID) | ORAL | 5 refills | Status: DC

## 2019-03-21 NOTE — Telephone Encounter (Signed)
Patient states that flexeril did not work last night.  States she took gabapentin 300 along with biofreeze and theraworx.  States heel had burning and tingling before she used a rub.  Would like to know if a script for gabapentin could be called in.  Patient uses [facility] in [place].   (Patient did state Suzanne Hansen told her to take 100 gabapentin in the morning and at night)

## 2019-03-21 NOTE — Telephone Encounter (Signed)
Ok, the gabapentin has been refilled, thanks

## 2019-04-02 DIAGNOSIS — M545 Other chronic pain: Secondary | ICD-10-CM | POA: Insufficient documentation

## 2019-04-02 DIAGNOSIS — G8929 Other chronic pain: Secondary | ICD-10-CM | POA: Insufficient documentation

## 2019-04-03 ENCOUNTER — Other Ambulatory Visit: Payer: Self-pay | Admitting: Student

## 2019-04-03 DIAGNOSIS — G8929 Other chronic pain: Secondary | ICD-10-CM

## 2019-04-11 ENCOUNTER — Other Ambulatory Visit: Payer: Self-pay | Admitting: Internal Medicine

## 2019-04-16 ENCOUNTER — Telehealth: Payer: Self-pay

## 2019-04-16 MED ORDER — ALLOPURINOL 100 MG PO TABS: 200.0000 mg | ORAL_TABLET | Freq: Every day | ORAL | 2 refills | Status: DC

## 2019-04-16 NOTE — Telephone Encounter (Signed)
Sounds like she has improved with a medication for gout, which can cause joint pain  I would see Rheum if they needed to follow up, otherwise we can continue the allopurinol long term if this has worked [employer]

## 2019-04-16 NOTE — Telephone Encounter (Signed)
Pt has been informed and expressed understanding. A refill has been sent in to her pharmacy.

## 2019-04-16 NOTE — Addendum Note (Signed)
Addended by: Juliet Rude on: 04/16/2019 09:58 AM   Modules accepted: Orders

## 2019-04-16 NOTE — Telephone Encounter (Signed)
Copied from [place] [id number]. Topic: General - Other >> Apr 16, 2019  9:03 AM Suzanne Hansen wrote: Reason for CRM: pt called in to be advised. Pt says that she was referred to a Rheumatologist for possible arthritis. Pt says that she doesn't have arthritis and was prescribed allopurinol (ZYLOPRIM) 100 MG tablet by kidney Dr and allopurinol (ZYLOPRIM) 200MG  tablet by her Rheumatologist. Pt says that she would like to know if she should still see Rheumatologist since she doesn't have arthritis? Pt says that 200MG  helps her pain. Pt would like to know if PCP could just maintain her pain and medication?   CB: [phone]-- pt would like a call back to discuss further.

## 2019-04-18 ENCOUNTER — Ambulatory Visit
Admission: RE | Admit: 2019-04-18 | Discharge: 2019-04-18 | Disposition: A | Discharge status: Home / Self Care | Payer: Managed Care, Other (non HMO) | Source: Ambulatory Visit | Attending: Student | Admitting: Student

## 2019-04-18 ENCOUNTER — Other Ambulatory Visit: Payer: Self-pay

## 2019-04-18 DIAGNOSIS — G8929 Other chronic pain: Secondary | ICD-10-CM

## 2019-04-18 MED ORDER — METHYLPREDNISOLONE ACETATE 40 MG/ML INJ SUSP (RADIOLOG
120.0000 mg | Freq: Once | INTRAMUSCULAR | Status: AC
  Administered 2019-04-18: 14:00:00 120 mg via INTRA_ARTICULAR

## 2019-04-18 MED ORDER — IOPAMIDOL (ISOVUE-M 200) INJECTION 41%
1.0000 mL | Freq: Once | INTRAMUSCULAR | Status: AC
  Administered 2019-04-18: 1 mL via INTRA_ARTICULAR

## 2019-04-18 NOTE — Discharge Instructions (Signed)

## 2019-05-13 ENCOUNTER — Ambulatory Visit: Payer: Managed Care, Other (non HMO) | Admitting: Family Medicine

## 2019-05-13 ENCOUNTER — Encounter: Payer: Self-pay | Admitting: Family Medicine

## 2019-05-13 ENCOUNTER — Other Ambulatory Visit: Payer: Self-pay

## 2019-05-13 VITALS — BP 144/90 | HR 81 | Temp 98.5°F | Ht 64.0 in | Wt 231.4 lb

## 2019-05-13 DIAGNOSIS — Z9989 Dependence on other enabling machines and devices: Secondary | ICD-10-CM

## 2019-05-13 DIAGNOSIS — G4733 Obstructive sleep apnea (adult) (pediatric): Secondary | ICD-10-CM

## 2019-05-13 NOTE — Progress Notes (Signed)
PATIENT: Suzanne Hansen DOB: 1956/07/01  REASON FOR VISIT: follow up HISTORY FROM: patient  Chief Complaint  Patient presents with  . Follow-up    Initial cpap f/u. Alone. Rm 2. No concerns at this time.     HISTORY OF PRESENT ILLNESS: Today 05/13/19 Suzanne Hansen is a 63 y.o. female here today for follow up for OSA on CPAP.  She returns today for initial compliance report.  She is doing very well and notes improvement in energy and less fatigue during the day.  Compliance report dated 04/09/2019 through 05/08/2019 reveals that she is using CPAP every day for compliance of 100%.  28 out of the last 30 days she used her machine greater than 4 hours for compliance of 93%.  AHI was 2.8 on 16 cm of water and EPR of 3.  There was no significant leak.  She is doing very well today and without concerns.  HISTORY: (copied from Dr Dohmeier's note on 09/18/2018)  Suzanne Hansen is a 63 y.o. female , she was originally seen in May 2017 seen here as a referral from Dentist DR Bennett Scrape, DMD at [place].  I have the pleasure of seeing Suzanne Hansen today in a revisit, Almost 3 years after his encounter at her last.  Today is 18 September 2018 and Suzanne Hansen reports that her cardiologist [name], DO at [facility] in [place] wants her to be reevaluated for sleep apnea.  The patient had been evaluated in an attended split-night polysomnography on 29 Feb 2016 at the time she had a baseline AHI of 17.3 mostly hypercapnia related, REM AHI was 52.6 she did not have oxygen desaturations of significance.  She was titrated to CPAP at 9 cmH2O the AHI became 0.0 at that setting and she tolerated supine positional sleep without further apneas, she also had a significant amount of REM sleep rebounding.  The patient started CPAP late May 2017 at home but she contracted frequent upper respiratory infections, sinusitis and she suffers from a easily congested nose and sinus symptoms.  She  felt that CPAP was not comfortable for her to wear and I had never seen her after the trial of CPAP and she decided to go with a dental appliance instead.  Her daughter now tells her that the dental appliance has not eliminated her snoring, as she could hear her mother snore.  Her heart failure specialist is also concerned that the dental appliance does nothing to treat the underlying REM dependent apnea, which is true.  Her family has noticed that she continues to have apneas while using the mouthpiece. The mouth piece cannot be adjusted ay further.    I have also the opportunity to read her cardiologist notes, [name], according to the visit notes Suzanne Hansen is described as a 63 year old female with obesity, with prior mild systolic heart failure but now normal ejection fraction after a right heart catheterization, paroxysmal atrial fibrillation.  Longstanding dyspnea and chest pain she has been on diuretics for several years.  Left ejection fraction had been 40 to 50% since 2014 or longer she is intolerant of lisinopril to the cough, she is not on anticoagulation for paroxysmal atrial fibrillation.  She is a borderline diabetic she is on statin for high lipids, she uses a mouth appliance for sleep apnea.  She was seen in November 2018 for chest pain had a negative stress test was given Lasix in the ED and had not taken her diuretics  at home that day.  She received a steroid injection for hip pain, she had no improvement after a nerve ablation but finally found some improvement after the cyst was removed from the lumbar spine, she has an appointment with nephrology based on her cardiology referral.  Her white blood cell count had been 11.3 hemoglobin 12.4 hematocrit 35.9, AST 24, AST 20 sodium 139 potassium 3.8 in August 2019, creatinine was 1.75 which is elevated on May 17, 2018.  BUN was high at 29.  Nephrology visit at [facility] was in 3rd October 2019, the patient was started on allopurinol.     Suzanne Hansen presents with Asthma, recurrent bronchitis, sinusitis,  hypothyroidism and weight gain ( BMI is now 25), and a  diagnosis of cardiomyopathy. Her cardiomyopathy progressed into heart failure, CKD stage 3 , and she developed leg edema. She knows she snores, because her throat is sore and her mouth is dry in AM, her daughters tell her she stops breathing and snores loudly.   Sleep habits are as follows: She usually returns home after work feeling almost ready to go to bed. She will be on her comfort close by 7 or 8 PM average bedtime is around 10 PM. She sleeps on one pillow, does not elevate the head of bed. She falls asleep on her side, but she wakes up she is often on her back. She usually falls asleep easily and she feels that she sleeps through the night some nights, others she will just wake up for no reason.  Sometimes she will gasp for air or seemingly snore herself awake,( mostly when falling asleep in a chair). She will have three  bathroom breaks at night on average, rarely 4-5 . Wakes up spontaneously at about 6.00 AM but she needs to be up by 7.AM.  No naps in daytime- work is busy. She may doze when not physically active or mentally stimulated and she does often feel sleepier when she drives, helping herself to some caffeine on the way.  Sleep and additional medical history :As a child she used to sleep with a night light on, he sometimes eating a big light. She had some nightmares the usual pre-school or fear of monsters. She has no history of sleepwalking so of true night terrors , but of enuresis.  She had a cyst removed from her spine 06-2017, and since her left leg bothers her "not as much".   Social history: She is divorced after 25 years of marriage, sleeps alone with her dog.  Adult  daughters, one married, one a Conservation officer, historic buildings, and the youngest one finishes her masters in education.  The youngest lives with her.  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 Caffeine - one cup in AM and rarely uses caffeine -  ETOH rare, less than one a week.  Tobacco use- in college  Sept 1977, and stopped with her first pregnancy - 5 cig a day.   REVIEW OF SYSTEMS: Out of a complete 14 system review of symptoms, the patient complains only of the following symptoms, memory loss, headache and all other reviewed systems are negative.  Epworth sleepiness scale: 3   ALLERGIES: Allergies  Allergen Reactions  . Clarithromycin Nausea And Vomiting  . Latex Itching and Other (See Comments)    ITCHING AND COLD SORES AROUND MOUTH WHEN LATEX GLOVES USED BY THE DENTIST/HYGENTIST  . Metronidazole Nausea And Vomiting  . Eggs Or Egg-Derived Products   .  Lisinopril   . Peanut Oil   . Shellfish Allergy   . Zostavax [Zoster Vaccine Live]     HOME MEDICATIONS: Outpatient Medications Prior to Visit  Medication Sig Dispense Refill  . albuterol (PROVENTIL HFA;VENTOLIN HFA) 108 (90 Base) MCG/ACT inhaler Inhale 2 puffs into the lungs every 4 (four) hours as needed for wheezing or shortness of breath. 1 Inhaler 3  . allopurinol (ZYLOPRIM) 100 MG tablet Take 2 tablets (200 mg total) by mouth daily. Take 200 mg by mouth daily. 60 tablet 2  . aspirin 81 MG EC tablet Take 81 mg by mouth daily.      [name] atorvastatin (LIPITOR) 20 MG tablet Take 1 tablet (20 mg total) by mouth daily. 90 tablet 3  . budesonide-formoterol (SYMBICORT) 160-4.5 MCG/ACT inhaler INHALE 2 PUFFS INTO THE LUNGS TWICE DAILY 10.2 g 6  . buPROPion (WELLBUTRIN SR) 150 MG 12 hr tablet Take 1 tablet (150 mg total) by mouth daily. 30 tablet 0  . Cholecalciferol (VITAMIN D) 50 MCG (2000 UT) CAPS Take by mouth.    . Cyanocobalamin (VITAMIN B-12) 1000 MCG SUBL Place 1 tablet (1,000 mcg total) under the tongue daily.  0  . cyclobenzaprine (FLEXERIL) 10 MG tablet Take 1 tablet (10 mg total) by mouth at bedtime as needed for muscle spasms. 90 tablet 3  . fexofenadine (ALLEGRA) 180 MG tablet Take 180  mg by mouth daily.      . fluticasone (FLONASE) 50 MCG/ACT nasal spray Place 2 sprays into both nostrils daily. 16 g 6  . Fluticasone-Salmeterol (ADVAIR DISKUS) 250-50 MCG/DOSE AEPB Inhale 1 puff into the lungs 2 (two) times daily. 3 each 3  . gabapentin (NEURONTIN) 100 MG capsule Take 1 capsule (100 mg total) by mouth 2 (two) times daily. 60 capsule 5  . gabapentin (NEURONTIN) 300 MG capsule TAKE 1 CAPSULE BY MOUTH THREE TIMES A DAY 90 capsule 1  . guaiFENesin (MUCINEX) 600 MG 12 hr tablet Take 2 tablets (1,200 mg total) by mouth 2 (two) times daily as needed for to loosen phlegm. 60 tablet 1  . ibuprofen (ADVIL,MOTRIN) 600 MG tablet Take 1 tablet (600 mg total) by mouth 3 (three) times daily. 90 tablet 2  . KLOR-CON M20 20 MEQ tablet TAKE 2 TABLETS (40 MEQ TOTAL) BY MOUTH DAILY  3  . levothyroxine (SYNTHROID) 112 MCG tablet Take 1 tablet (112 mcg total) by mouth daily. 90 tablet 3  . Liniments (BLUE-EMU SUPER STRENGTH) CREA Apply 1 Dose topically 2 (two) times daily. 1 Bottle 1  . losartan (COZAAR) 25 MG tablet Take 25 mg by mouth daily. Take 1/2 tab po qd    . Multiple Vitamin (MULTIVITAMIN) capsule Take 1 capsule by mouth daily.      [name] omeprazole (PRILOSEC) 20 MG capsule Take 20 mg by mouth as needed.    . predniSONE (DELTASONE) 10 MG tablet 2 tabs by mouth per day for 5 days 18 tablet 0  . topiramate (TOPAMAX) 50 MG tablet TAKE 1 TABLET(50 MG) BY MOUTH AT BEDTIME 30 tablet 0  . torsemide (DEMADEX) 20 MG tablet 40mg  in the morning and 40mg  evening time.    . valACYclovir (VALTREX) 1000 MG tablet Take 500 mg by mouth daily.  0  . vitamin C (ASCORBIC ACID) 500 MG tablet Take 1 tablet (500 mg total) by mouth daily. 30 tablet    No facility-administered medications prior to visit.     PAST MEDICAL HISTORY: Past Medical History:  Diagnosis Date  . ALLERGIC RHINITIS 05/09/2008  .  Allergy   . ANXIETY 05/23/2007  . ARM PAIN, LEFT 10/23/2009  . Arthritis   . Asthma   . ASTHMA 05/23/2007  .  Back pain   . Cardiomyopathy ([place]) 09/08/2015  . Chest pain   . CHF (congestive heart failure) ([place])   . Constipation   . CONTACT DERMATITIS 06/09/2009  . Diabetes mellitus   . ENDOMETRIOSIS NOS 05/23/2007  . FACIAL PAIN 06/02/2010  . Food allergy   . FREQUENCY, URINARY 05/17/2010  . GLUCOSE INTOLERANCE 08/28/2009  . HERPES SIMPLEX, UNCOMPLICATED 2/95/1884  . Hyperlipidemia 08/28/2015  . HYPERTHYROIDISM 05/23/2007  . Hypoactive thyroid   . HYPOTHYROIDISM 05/09/2008  . Impaired glucose tolerance 01/28/2011  . INSOMNIA, HX OF 05/23/2007  . Joint pain   . LATERAL EPICONDYLITIS, RIGHT 03/10/2009  . LIPOMA 10/22/2010  . NECK PAIN 11/10/2010  . OBESITY 05/23/2007  . Osteopenia 09/28/2018  . Plantar fasciitis    Both feet  . SHOULDER PAIN, LEFT 05/17/2010  . SINUSITIS- ACUTE-NOS 11/03/2008  . SKIN LESION 11/10/2010  . Sleep apnea   . Stage III chronic kidney disease ([place])   . Unspecified Chest Pain 07/25/2008  . UNSPECIFIED URTICARIA 06/04/2009  . URI 08/28/2009  . UTI 04/29/2008  . VITAMIN D DEFICIENCY 08/28/2009  . Wheezing 07/12/2010    PAST SURGICAL HISTORY: Past Surgical History:  Procedure Laterality Date  . COLONOSCOPY    . ENDOMETRIAL ABLATION    . LAMINECTOMY  1996  . LAPAROTOMY     exploratory  . lower back surgery    . POLYPECTOMY    . s/p endometrial ablation  12/06  . TUBAL LIGATION    . UTERINE FIBROID SURGERY    . WISDOM TOOTH EXTRACTION      FAMILY HISTORY: Family History  Problem Relation Age of Onset  . Diabetes Cousin   . Hypertension Cousin   . Heart disease Cousin   . Heart disease Mother        Rheumatic heart disease  . Depression Mother   . COPD Father   . Hypertension Father   . Cancer Maternal Grandmother        Breast  . Diabetes Sister   . Kidney disease Sister     SOCIAL HISTORY: Social History   Socioeconomic History  . Marital status: Divorced    Spouse name: Not on file  . Number of children: 3  . Years of education: Not on file  .  Highest education level: Not on file  Occupational History  . Occupation: SOCIAL WORKER    Employer: [employer]    Comment: [employer]  . Financial resource strain: Not on file  . Food insecurity    Worry: Not on file    Inability: Not on file  . Transportation needs    Medical: Not on file    Non-medical: Not on file  Tobacco Use  . Smoking status: Former Smoker    Packs/day: 0.50    Types: Cigarettes    Quit date: 11/05/1987    Years since quitting: 31.5  . Smokeless tobacco: Never Used  Substance and Sexual Activity  . Alcohol use: No    Alcohol/week: 1.0 standard drinks    Types: 1 Glasses of wine per week    Comment: WINE  . Drug use: No  . Sexual activity: Not on file  Lifestyle  . Physical activity    Days per week: Not on file    Minutes per session: Not on file  .  Stress: Not on file  Relationships  . Social Herbalist on phone: Not on file    Gets together: Not on file    Attends religious service: Not on file    Active member of club or organization: Not on file    Attends meetings of clubs or organizations: Not on file    Relationship status: Not on file  . Intimate partner violence    Fear of current or ex partner: Not on file    Emotionally abused: Not on file    Physically abused: Not on file    Forced sexual activity: Not on file  Other Topics Concern  . Not on file  Social History Narrative  . Not on file      PHYSICAL EXAM  Vitals:   05/13/19 1512  BP: (!) 144/90  Pulse: 81  Temp: 98.5 F (36.9 C)  TempSrc: Oral  Weight: 231 lb 6.4 oz (105 kg)  Height: 5\' 4"  (1.626 m)   Body mass index is 39.72 kg/m.  Generalized: Well developed, in no acute distress  Cardiology: normal rate and rhythm, no murmur noted Neurological examination  Mentation: Alert oriented to time, place, history taking. Follows all commands speech and language fluent Cranial nerve II-XII: Pupils were equal round reactive to  light. Extraocular movements were full, visual field were full on confrontational test. Facial sensation and strength were normal. Uvula tongue midline. Head turning and shoulder shrug  were normal and symmetric. Motor: The motor testing reveals 5 over 5 strength of all 4 extremities. Good symmetric motor tone is noted throughout.  Coordination: Cerebellar testing reveals good finger-nose-finger and heel-to-shin bilaterally.  Gait and station: Gait is normal.   DIAGNOSTIC DATA (LABS, IMAGING, TESTING) - I reviewed patient records, labs, notes, testing and imaging myself where available.  No flowsheet data found.   Lab Results  Component Value Date   WBC 8.9 09/28/2018   HGB 13.3 09/28/2018   HCT 39.4 09/28/2018   MCV 87.6 09/28/2018   PLT 282.0 09/28/2018      Component Value Date/Time   NA 141 09/28/2018 1519   NA 140 09/24/2018 1050   K 4.1 09/28/2018 1519   CL 102 09/28/2018 1519   CO2 30 09/28/2018 1519   GLUCOSE 100 (H) 09/28/2018 1519   BUN 27 (H) 09/28/2018 1519   BUN 24 09/24/2018 1050   CREATININE 1.44 (H) 09/28/2018 1519   CREATININE 0.97 09/17/2015 1113   CALCIUM 10.3 09/28/2018 1519   PROT 7.9 09/28/2018 1519   PROT 6.7 09/24/2018 1050   ALBUMIN 4.7 09/28/2018 1519   ALBUMIN 4.4 09/24/2018 1050   AST 17 09/28/2018 1519   ALT 17 09/28/2018 1519   ALKPHOS 103 09/28/2018 1519   BILITOT 0.4 09/28/2018 1519   BILITOT 0.5 09/24/2018 1050   GFRNONAA 39 (L) 09/24/2018 1050   GFRAA 45 (L) 09/24/2018 1050   Lab Results  Component Value Date   CHOL 131 09/28/2018   HDL 77.70 09/28/2018   LDLCALC 37 09/28/2018   TRIG 84.0 09/28/2018   CHOLHDL 2 09/28/2018   Lab Results  Component Value Date   HGBA1C 5.6 09/28/2018   No results found for: VITAMINB12 Lab Results  Component Value Date   TSH 0.93 03/19/2019    ASSESSMENT AND PLAN 63 y.o. year old female  has a past medical history of ALLERGIC RHINITIS (05/09/2008), Allergy, ANXIETY (05/23/2007), ARM PAIN, LEFT  (10/23/2009), Arthritis, Asthma, ASTHMA (05/23/2007), Back pain, Cardiomyopathy ([place]) (09/08/2015), Chest pain, CHF (  congestive heart failure) ([place]), Constipation, CONTACT DERMATITIS (06/09/2009), Diabetes mellitus, ENDOMETRIOSIS NOS (05/23/2007), FACIAL PAIN (06/02/2010), Food allergy, FREQUENCY, URINARY (05/17/2010), GLUCOSE INTOLERANCE (08/28/2009), HERPES SIMPLEX, UNCOMPLICATED (1/50/5697), Hyperlipidemia (08/28/2015), HYPERTHYROIDISM (05/23/2007), Hypoactive thyroid, HYPOTHYROIDISM (05/09/2008), Impaired glucose tolerance (01/28/2011), INSOMNIA, HX OF (05/23/2007), Joint pain, LATERAL EPICONDYLITIS, RIGHT (03/10/2009), LIPOMA (10/22/2010), NECK PAIN (11/10/2010), OBESITY (05/23/2007), Osteopenia (09/28/2018), Plantar fasciitis, SHOULDER PAIN, LEFT (05/17/2010), SINUSITIS- ACUTE-NOS (11/03/2008), SKIN LESION (11/10/2010), Sleep apnea, Stage III chronic kidney disease ([place]), Unspecified Chest Pain (07/25/2008), UNSPECIFIED URTICARIA (06/04/2009), URI (08/28/2009), UTI (04/29/2008), VITAMIN D DEFICIENCY (08/28/2009), and Wheezing (07/12/2010). here with     ICD-10-CM   1. OSA on CPAP  G47.33    Z99.89     Ms. Barnick is doing very well with CPAP therapy.  She has noted significant improvement in her energy and less daytime sleepiness.  Compliance report reveals excellent compliance.  She was encouraged to continue using CPAP nightly and for greater than 4 hours each night.  She will follow-up annually, sooner if needed.  She verbalizes understanding and agreement with this plan.   No orders of the defined types were placed in this encounter.    No orders of the defined types were placed in this encounter.     I spent 15 minutes with the patient. 50% of this time was spent counseling and educating patient on plan of care and medications.    Debbora Presto, FNP-C 05/13/2019, 4:08 PM [facility] [address] [place], [place] [address] [phone]

## 2019-05-13 NOTE — Patient Instructions (Signed)

## 2019-05-14 ENCOUNTER — Other Ambulatory Visit: Payer: Self-pay | Admitting: Family

## 2019-05-20 ENCOUNTER — Other Ambulatory Visit: Payer: Self-pay | Admitting: Internal Medicine

## 2019-05-20 MED ORDER — ALBUTEROL SULFATE HFA 108 (90 BASE) MCG/ACT IN AERS: 2.0000 | INHALATION_SPRAY | RESPIRATORY_TRACT | 11 refills | Status: DC | PRN

## 2019-05-20 NOTE — Telephone Encounter (Signed)
Medication: albuterol (PROVENTIL HFA;VENTOLIN HFA) 108 (90 Base) MCG/ACT inhaler [[id number]]   Has the patient contacted their pharmacy? Yes  (Agent: If no, request that the patient contact the pharmacy for the refill.) (Agent: If yes, when and what did the pharmacy advise?)  Preferred Pharmacy (with phone number or street name): [id number] - [place], [place] [address] [phone] (Phone) [phone] (Fax    Agent: Please be advised that RX refills may take up to 3 business days. We ask that you follow-up with your pharmacy.

## 2019-05-31 ENCOUNTER — Encounter: Payer: Self-pay | Admitting: Internal Medicine

## 2019-05-31 ENCOUNTER — Ambulatory Visit (INDEPENDENT_AMBULATORY_CARE_PROVIDER_SITE_OTHER): Payer: Managed Care, Other (non HMO) | Admitting: Internal Medicine

## 2019-05-31 ENCOUNTER — Ambulatory Visit (HOSPITAL_COMMUNITY)
Admission: RE | Admit: 2019-05-31 | Discharge: 2019-05-31 | Disposition: A | Discharge status: Home / Self Care | Payer: Managed Care, Other (non HMO) | Source: Ambulatory Visit | Attending: Internal Medicine | Admitting: Internal Medicine

## 2019-05-31 ENCOUNTER — Other Ambulatory Visit: Payer: Self-pay

## 2019-05-31 ENCOUNTER — Ambulatory Visit (INDEPENDENT_AMBULATORY_CARE_PROVIDER_SITE_OTHER)
Admission: RE | Admit: 2019-05-31 | Discharge: 2019-05-31 | Disposition: A | Discharge status: Home / Self Care | Payer: Managed Care, Other (non HMO) | Source: Ambulatory Visit | Attending: Internal Medicine | Admitting: Internal Medicine

## 2019-05-31 VITALS — BP 138/72 | HR 79 | Temp 98.4°F | Wt 235.9 lb

## 2019-05-31 DIAGNOSIS — M79662 Pain in left lower leg: Secondary | ICD-10-CM

## 2019-05-31 DIAGNOSIS — R7303 Prediabetes: Secondary | ICD-10-CM | POA: Diagnosis not present

## 2019-05-31 DIAGNOSIS — M25462 Effusion, left knee: Secondary | ICD-10-CM

## 2019-05-31 DIAGNOSIS — M7989 Other specified soft tissue disorders: Secondary | ICD-10-CM

## 2019-05-31 DIAGNOSIS — D6859 Other primary thrombophilia: Secondary | ICD-10-CM

## 2019-05-31 MED ORDER — TRAMADOL HCL 50 MG PO TABS: 50.0000 mg | ORAL_TABLET | Freq: Four times a day (QID) | ORAL | 0 refills | Status: DC | PRN

## 2019-05-31 NOTE — Progress Notes (Signed)
Subjective:    Patient ID: Suzanne Hansen, female    DOB: [birth year], 63 y.o.   MRN: [id number]  HPI  Here after an unfortunate fall aug 19 where she was bending forward to pick up something and fell to both knees getting off balance with left > knee pain and small abrasions, but since then has developed marked worsening sharp constant mod to severe  pain and swelling to the left knee with reduced ROM but no fever, further falls or giveaway.  Also has increased more distal  LLE swelling and post calf seems tender as well.  Pt denies chest pain, increased sob or doe, wheezing, orthopnea, PND, palpitations, dizziness or syncope.  No worsening RLE swelling or pain.  Abrasions themselves seem near resolved.  Pt denies polydipsia, polyuria, Denies worsening depressive symptoms, suicidal ideation, or panic \ Past Medical History:  Diagnosis Date  . ALLERGIC RHINITIS 05/09/2008  . Allergy   . ANXIETY 05/23/2007  . ARM PAIN, LEFT 10/23/2009  . Arthritis   . Asthma   . ASTHMA 05/23/2007  . Back pain   . Cardiomyopathy ([place]) 09/08/2015  . Chest pain   . CHF (congestive heart failure) ([place])   . Constipation   . CONTACT DERMATITIS 06/09/2009  . Diabetes mellitus   . ENDOMETRIOSIS NOS 05/23/2007  . FACIAL PAIN 06/02/2010  . Food allergy   . FREQUENCY, URINARY 05/17/2010  . GLUCOSE INTOLERANCE 08/28/2009  . HERPES SIMPLEX, UNCOMPLICATED [id number]  . Hyperlipidemia 08/28/2015  . HYPERTHYROIDISM 05/23/2007  . Hypoactive thyroid   . HYPOTHYROIDISM 05/09/2008  . Impaired glucose tolerance 01/28/2011  . INSOMNIA, HX OF 05/23/2007  . Joint pain   . LATERAL EPICONDYLITIS, RIGHT 03/10/2009  . LIPOMA 10/22/2010  . NECK PAIN 11/10/2010  . OBESITY 05/23/2007  . Osteopenia 09/28/2018  . Plantar fasciitis    Both feet  . SHOULDER PAIN, LEFT 05/17/2010  . SINUSITIS- ACUTE-NOS 11/03/2008  . SKIN LESION 11/10/2010  . Sleep apnea   . Stage III chronic kidney disease ([place])   . Unspecified Chest Pain 07/25/2008  .  UNSPECIFIED URTICARIA 06/04/2009  . URI 08/28/2009  . UTI 04/29/2008  . VITAMIN D DEFICIENCY 08/28/2009  . Wheezing 07/12/2010   Past Surgical History:  Procedure Laterality Date  . COLONOSCOPY    . ENDOMETRIAL ABLATION    . LAMINECTOMY  1996  . LAPAROTOMY     exploratory  . lower back surgery    . POLYPECTOMY    . s/p endometrial ablation  12/06  . TUBAL LIGATION    . UTERINE FIBROID SURGERY    . WISDOM TOOTH EXTRACTION      reports that she quit smoking about 31 years ago. Her smoking use included cigarettes. She smoked 0.50 packs per day. She has never used smokeless tobacco. She reports that she does not drink alcohol or use drugs. family history includes COPD in her father; Cancer in her maternal grandmother; Depression in her mother; Diabetes in her cousin and sister; Heart disease in her cousin and mother; Hypertension in her cousin and father; Kidney disease in her sister. Allergies  Allergen Reactions  . Clarithromycin Nausea And Vomiting  . Latex Itching and Other (See Comments)    ITCHING AND COLD SORES AROUND MOUTH WHEN LATEX GLOVES USED BY THE DENTIST/HYGENTIST  . Metronidazole Nausea And Vomiting  . Eggs Or Egg-Derived Products   . Lisinopril   . Peanut Oil   . Shellfish Allergy   . Zostavax [Zoster Vaccine Live]    Current Outpatient  Medications on File Prior to Visit  Medication Sig Dispense Refill  . albuterol (VENTOLIN HFA) 108 (90 Base) MCG/ACT inhaler Inhale 2 puffs into the lungs every 4 (four) hours as needed for wheezing or shortness of breath. 18 g 11  . allopurinol (ZYLOPRIM) 100 MG tablet Take 2 tablets (200 mg total) by mouth daily. Take 200 mg by mouth daily. 60 tablet 2  . aspirin 81 MG EC tablet Take 81 mg by mouth daily.      [name] atorvastatin (LIPITOR) 20 MG tablet Take 1 tablet (20 mg total) by mouth daily. 90 tablet 3  . budesonide-formoterol (SYMBICORT) 160-4.5 MCG/ACT inhaler INHALE 2 PUFFS INTO THE LUNGS TWICE DAILY 10.2 g 6  . buPROPion  (WELLBUTRIN SR) 150 MG 12 hr tablet Take 1 tablet (150 mg total) by mouth daily. 30 tablet 0  . Cholecalciferol (VITAMIN D) 50 MCG (2000 UT) CAPS Take by mouth.    . Cyanocobalamin (VITAMIN B-12) 1000 MCG SUBL Place 1 tablet (1,000 mcg total) under the tongue daily.  0  . cyclobenzaprine (FLEXERIL) 10 MG tablet Take 1 tablet (10 mg total) by mouth at bedtime as needed for muscle spasms. 90 tablet 3  . fexofenadine (ALLEGRA) 180 MG tablet Take 180 mg by mouth daily.      . fluticasone (FLONASE) 50 MCG/ACT nasal spray Place 2 sprays into both nostrils daily. 16 g 6  . Fluticasone-Salmeterol (ADVAIR DISKUS) 250-50 MCG/DOSE AEPB Inhale 1 puff into the lungs 2 (two) times daily. 3 each 3  . gabapentin (NEURONTIN) 100 MG capsule Take 1 capsule (100 mg total) by mouth 2 (two) times daily. 60 capsule 5  . gabapentin (NEURONTIN) 300 MG capsule TAKE 1 CAPSULE BY MOUTH THREE TIMES A DAY 90 capsule 1  . guaiFENesin (MUCINEX) 600 MG 12 hr tablet Take 2 tablets (1,200 mg total) by mouth 2 (two) times daily as needed for to loosen phlegm. 60 tablet 1  . ibuprofen (ADVIL,MOTRIN) 600 MG tablet Take 1 tablet (600 mg total) by mouth 3 (three) times daily. 90 tablet 2  . KLOR-CON M20 20 MEQ tablet TAKE 2 TABLETS (40 MEQ TOTAL) BY MOUTH DAILY  3  . levothyroxine (SYNTHROID) 112 MCG tablet Take 1 tablet (112 mcg total) by mouth daily. 90 tablet 3  . Liniments (BLUE-EMU SUPER STRENGTH) CREA Apply 1 Dose topically 2 (two) times daily. 1 Bottle 1  . losartan (COZAAR) 25 MG tablet Take 25 mg by mouth daily. Take 1/2 tab po qd    . Multiple Vitamin (MULTIVITAMIN) capsule Take 1 capsule by mouth daily.      [name] omeprazole (PRILOSEC) 20 MG capsule Take 20 mg by mouth as needed.    . predniSONE (DELTASONE) 10 MG tablet 2 tabs by mouth per day for 5 days 18 tablet 0  . topiramate (TOPAMAX) 50 MG tablet TAKE 1 TABLET(50 MG) BY MOUTH AT BEDTIME 30 tablet 0  . torsemide (DEMADEX) 20 MG tablet 40mg  in the morning and 40mg  evening  time.    . valACYclovir (VALTREX) 1000 MG tablet Take 500 mg by mouth daily.  0  . vitamin C (ASCORBIC ACID) 500 MG tablet Take 1 tablet (500 mg total) by mouth daily. 30 tablet    No current facility-administered medications on file prior to visit.    Review of Systems  Constitutional: Negative for other unusual diaphoresis or sweats HENT: Negative for ear discharge or swelling Eyes: Negative for other worsening visual disturbances Respiratory: Negative for stridor or other swelling  Gastrointestinal:  Negative for worsening distension or other blood Genitourinary: Negative for retention or other urinary change Musculoskeletal: Negative for other MSK pain or swelling Skin: Negative for color change or other new lesions Neurological: Negative for worsening tremors and other numbness  Psychiatric/Behavioral: Negative for worsening agitation or other fatigue All other system neg per pt    Objective:   Physical Exam BP 138/72 (BP Location: Left Arm, Cuff Size: Large)   Pulse 79   Temp 98.4 F (36.9 C) (Oral)   Wt 235 lb 14.4 oz (107 kg)   SpO2 93%   BMI 40.49 kg/m  VS noted,  Constitutional: Pt appears in NAD HENT: Head: NCAT.  Right Ear: External ear normal.  Left Ear: External ear normal.  Eyes: . Pupils are equal, round, and reactive to light. Conjunctivae and EOM are normal Nose: without d/c or deformity Neck: Neck supple. Gross normal ROM Cardiovascular: Normal rate and regular rhythm.   Pulmonary/Chest: Effort normal and breath sounds without rales or wheezing.  Right knee with slight healing abrasion without effusion and FROM Left knee with 2-3+ swelling diffuse tender and healing patellar abrasions, has reduced ROM LLE below the knee with 2+ swelling tapering to the foot without ulcer or erythema, but has mild tenderness to the post left gastroc Neurological: Pt is alert. At baseline orientation, motor grossly intact Skin: Skin is warm. No rashes, other new lesions,  Psychiatric: Pt behavior is normal without agitation  No other exam findings  Lab Results  Component Value Date   WBC 8.9 09/28/2018   HGB 13.3 09/28/2018   HCT 39.4 09/28/2018   PLT 282.0 09/28/2018   GLUCOSE 100 (H) 09/28/2018   CHOL 131 09/28/2018   TRIG 84.0 09/28/2018   HDL 77.70 09/28/2018   LDLCALC 37 09/28/2018   ALT 17 09/28/2018   AST 17 09/28/2018   NA 141 09/28/2018   K 4.1 09/28/2018   CL 102 09/28/2018   CREATININE 1.44 (H) 09/28/2018   BUN 27 (H) 09/28/2018   CO2 30 09/28/2018   TSH 0.93 03/19/2019   INR 1.0 03/21/2017   HGBA1C 5.6 09/28/2018   MICROALBUR <0.7 09/28/2018       Assessment & Plan:

## 2019-05-31 NOTE — Patient Instructions (Addendum)
Please take all new medication as prescribed - the pain medication  Please continue all other medications as before, and refills have been done if requested.  Please have the pharmacy call with any other refills you may need.  Please continue your efforts at being more active, low cholesterol diet, and weight control  Please keep your appointments with your specialists as you may have planned  Please go to the XRAY Department in the Basement (go straight as you get off the elevator) for the x-ray testing  You will be contacted regarding the referral for: Left leg vein circulation testing (for blood clot check) - to see [facility] now  You will be contacted by phone if any changes need to be made immediately.  Otherwise, you will receive a letter about your results with an explanation, but please check with MyChart first.  Please remember to sign up for MyChart if you have not done so, as this will be important to you in the future with finding out test results, communicating by private email, and scheduling acute appointments online when needed.

## 2019-06-02 ENCOUNTER — Encounter: Payer: Self-pay | Admitting: Internal Medicine

## 2019-06-02 NOTE — Assessment & Plan Note (Signed)
stable overall by history and exam, recent data reviewed with pt, and pt to continue medical treatment as before,  to f/u any worsening symptoms or concerns  

## 2019-06-02 NOTE — Assessment & Plan Note (Signed)
Most likely sympathetic swelling related to the left knee, but will need rule DVT  - for venous dopplers

## 2019-06-02 NOTE — Assessment & Plan Note (Addendum)
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

## 2019-06-13 ENCOUNTER — Telehealth: Payer: Self-pay | Admitting: Internal Medicine

## 2019-06-13 DIAGNOSIS — M25562 Pain in left knee: Secondary | ICD-10-CM

## 2019-06-13 NOTE — Telephone Encounter (Signed)
Ok for ortho referral - done 

## 2019-06-13 NOTE — Addendum Note (Signed)
Addended by: Biagio Borg on: 06/13/2019 03:52 PM   Modules accepted: Orders

## 2019-06-13 NOTE — Telephone Encounter (Signed)
Left knee pain has not improved,back of knee has worsen seeking clinical advice, patient was seen by PCP on 05/31/2019, please advise

## 2019-06-14 NOTE — Telephone Encounter (Signed)
Pt has been informed.

## 2019-07-03 ENCOUNTER — Other Ambulatory Visit: Payer: Self-pay

## 2019-07-03 ENCOUNTER — Ambulatory Visit (INDEPENDENT_AMBULATORY_CARE_PROVIDER_SITE_OTHER): Payer: Managed Care, Other (non HMO) | Admitting: Bariatrics

## 2019-07-03 ENCOUNTER — Encounter (INDEPENDENT_AMBULATORY_CARE_PROVIDER_SITE_OTHER): Payer: Self-pay | Admitting: Bariatrics

## 2019-07-03 ENCOUNTER — Ambulatory Visit (INDEPENDENT_AMBULATORY_CARE_PROVIDER_SITE_OTHER): Payer: Managed Care, Other (non HMO) | Admitting: Orthopaedic Surgery

## 2019-07-03 ENCOUNTER — Ambulatory Visit: Payer: Managed Care, Other (non HMO) | Admitting: Orthopedic Surgery

## 2019-07-03 ENCOUNTER — Encounter: Payer: Self-pay | Admitting: Orthopaedic Surgery

## 2019-07-03 VITALS — BP 112/79 | HR 83 | Temp 98.6°F | Ht 64.0 in | Wt 229.0 lb

## 2019-07-03 DIAGNOSIS — G4733 Obstructive sleep apnea (adult) (pediatric): Secondary | ICD-10-CM

## 2019-07-03 DIAGNOSIS — F3289 Other specified depressive episodes: Secondary | ICD-10-CM | POA: Diagnosis not present

## 2019-07-03 DIAGNOSIS — G8929 Other chronic pain: Secondary | ICD-10-CM

## 2019-07-03 DIAGNOSIS — M25562 Pain in left knee: Secondary | ICD-10-CM

## 2019-07-03 DIAGNOSIS — R7303 Prediabetes: Secondary | ICD-10-CM

## 2019-07-03 DIAGNOSIS — Z9989 Dependence on other enabling machines and devices: Secondary | ICD-10-CM

## 2019-07-03 DIAGNOSIS — E0590 Thyrotoxicosis, unspecified without thyrotoxic crisis or storm: Secondary | ICD-10-CM

## 2019-07-03 DIAGNOSIS — Z9189 Other specified personal risk factors, not elsewhere classified: Secondary | ICD-10-CM | POA: Diagnosis not present

## 2019-07-03 DIAGNOSIS — Z6839 Body mass index (BMI) 39.0-39.9, adult: Secondary | ICD-10-CM

## 2019-07-03 MED ORDER — LIDOCAINE HCL 1 % IJ SOLN
3.0000 mL | INTRAMUSCULAR | Status: AC | PRN
  Administered 2019-07-03: 3 mL

## 2019-07-03 MED ORDER — METHYLPREDNISOLONE ACETATE 40 MG/ML IJ SUSP
40.0000 mg | INTRAMUSCULAR | Status: AC | PRN
  Administered 2019-07-03: 40 mg via INTRA_ARTICULAR

## 2019-07-03 NOTE — Progress Notes (Signed)
Office Visit Note   Patient: Suzanne Hansen           Date of Birth: 10-Apr-1956           MRN: [id number] Visit Date: 07/03/2019              Requested by: Biagio Borg, MD [place],  [place] [address] PCP: Biagio Borg, MD   Assessment & Plan: Visit Diagnoses:  1. Chronic pain of left knee     Plan: I am concerned about a meniscal tear in her left knee given her positive McMurray's exam and her locking catching.  Her plain films are not remarkable for any type of significant arthritic problems.  There could also be a stress fracture given her fall.  At this point an MRI is warranted given the failure of conservative treatment.  She is tried all forms of conservative treatment as well and this is all felt for her.  Given the fact this is been going on now for over 12 months and now has worsened more recently since her fall, an MRI is warranted to rule out a fracture and or a meniscal tear.  I did recommend at least 1 more steroid injection her left knee today as well and she tolerated this well.  All question concerns were answered addressed.  We will see her back after the MRI of her left knee is obtained.  Follow-Up Instructions: No follow-ups on file.   Orders:  Orders Placed This Encounter  Procedures  . Large Joint Inj   No orders of the defined types were placed in this encounter.     Procedures: Large Joint Inj: L knee on 07/03/2019 3:15 PM Indications: diagnostic evaluation and pain Details: 22 G 1.5 in needle, superolateral approach  Arthrogram: No  Medications: 3 mL lidocaine 1 %; 40 mg methylPREDNISolone acetate 40 MG/ML Outcome: tolerated well, no immediate complications Procedure, treatment alternatives, risks and benefits explained, specific risks discussed. Consent was given by the patient. Immediately prior to procedure a time out was called to verify the correct patient, procedure, equipment, support staff and site/side marked as required.  Patient was prepped and draped in the usual sterile fashion.       Clinical Data: No additional findings.   Subjective: Chief Complaint  Patient presents with  . Left Knee - Pain  The patient is someone I am seeing for the first time as a patient but have actually seen her with her daughter before.  She has been dealing with chronic left knee issues for some time and has had steroid injections in the knee in the past.  However she is had a mechanical fall earlier this year in August and she is had severe left knee pain since then.  Is only been getting worse.  She has been having locking and catching with her left knee.  She has had at least 1 steroid injection this year that has not helped much.  She says her knee really bothers her quite a bit with going up and down stairs.  She is never had surgery on that knee before.  She has had some chronic knee issues in the past but this is been more acute with her left knee.  She is work on Forensic scientist exercises and activity modification.  She is work on weight loss.  She cannot take anti-inflammatories due to congestive heart failure.  HPI  Review of Systems She currently denies any headache,  chest pain, shortness of breath, fever, chills, nausea, vomiting  Objective: Vital Signs: There were no vitals taken for this visit.  Physical Exam She is alert and orient x3 and in no acute distress Ortho Exam Examination of her left knee shows significant pain in the posterior aspect of her knee past 90 degrees of flexion.  She has positive Murray signs of both medial and lateral compartments.  Her knee feels ligamentously stable otherwise. Specialty Comments:  No specialty comments available.  Imaging: No results found. X-rays independently reviewed from earlier this year of her left knee showed no acute findings.  The medial lateral compartments are still well-maintained.  There is some signs of osteophytes in the knee.  There is  patellofemoral disease.  PMFS History: Patient Active Problem List   Diagnosis Date Noted  . Effusion of left knee 05/31/2019  . Pain and swelling of left lower leg 05/31/2019  . Left otitis media 12/17/2018  . Asthma exacerbation 12/17/2018  . At risk for diabetes mellitus 11/08/2018  . Arthritis 10/29/2018  . Cervical radiculopathy at C6 10/23/2018  . Osteopenia 09/28/2018  . Elevated blood uric acid level 09/28/2018  . Polyarthralgia 09/28/2018  . Paroxysmal atrial fibrillation ([place]) 09/18/2018  . CHF (congestive heart failure), NYHA class I, unspecified failure chronicity, systolic ([place]) [id number]  . Prediabetes 08/13/2018  . OSA on CPAP 08/13/2018  . CKD (chronic kidney disease) stage 3, GFR 30-59 ml/min ([facility]) 06/26/2018  . Rash 06/26/2018  . Wheezing 06/26/2018  . Left hip pain 05/27/2018  . Chronic systolic (congestive) heart failure ([place]) 05/16/2018  . Bicipital tendinitis of right shoulder 05/16/2018  . Bursitis of left hip 05/16/2018  . GERD (gastroesophageal reflux disease) 09/27/2017  . Acute shoulder bursitis, left 08/28/2017  . Lumbosacral radiculopathy at L4 06/14/2017  . 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 Easy bruising 03/29/2017  . Left leg paresthesias 03/29/2017  . Muscle cramping 03/20/2017  . 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 Pain in lower jaw 11/10/2016  . Cough 11/06/2016  . Vaginitis 09/17/2016  . Left wrist pain 09/16/2016  . Hypercoagulable state ([place]) 06/09/2016  . Iron deficiency 06/09/2016  . Lumbar disc disease 06/09/2016  . Hypersomnia with sleep apnea 02/15/2016  . 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 Extrinsic asthma 02/15/2016  . Ankle edema 02/15/2016  . Degenerative arthritis of knee, bilateral 01/21/2016  . Asthma with exacerbation 10/20/2015  . Acute sinus infection 09/29/2015  . Cardiomyopathy ([place]) 09/08/2015  . Bilateral calf pain 08/31/2015  . Hyperlipidemia  08/28/2015  . Left knee pain 08/28/2015  . Varicose veins with pain 08/28/2015  . Peripheral edema 08/13/2015  . Injection site reaction 06/02/2015  . PHN (postherpetic neuralgia) 05/29/2015  . Unspecified asthma, with exacerbation 07/01/2014  . Shingles 06/10/2014  . Skin nodule 11/27/2012  . Encounter for well adult exam with abnormal findings 01/28/2011  . LIPOMA 10/22/2010  . VITAMIN D DEFICIENCY 08/28/2009  . Hypothyroidism 05/09/2008  . Allergic rhinitis 05/09/2008  . HERPES ZOSTER W/NERVOUS COMPLICATION NEC [id number]  . HYPERTHYROIDISM 05/23/2007  . OBESITY 05/23/2007  . Anxiety state 05/23/2007  . Asthma 05/23/2007  . ENDOMETRIOSIS NOS 05/23/2007  . INSOMNIA, HX OF 05/23/2007   Past Medical History:  Diagnosis Date  . ALLERGIC RHINITIS 05/09/2008  . Allergy   . ANXIETY 05/23/2007  . ARM PAIN, LEFT 10/23/2009  . Arthritis   . Asthma   . ASTHMA 05/23/2007  . Back pain   . Cardiomyopathy ([place]) 09/08/2015  .  Chest pain   . CHF (congestive heart failure) ([place])   . Constipation   . CONTACT DERMATITIS 06/09/2009  . Diabetes mellitus   . ENDOMETRIOSIS NOS 05/23/2007  . FACIAL PAIN 06/02/2010  . Food allergy   . FREQUENCY, URINARY 05/17/2010  . GLUCOSE INTOLERANCE 08/28/2009  . HERPES SIMPLEX, UNCOMPLICATED [id number]  . Hyperlipidemia 08/28/2015  . HYPERTHYROIDISM 05/23/2007  . Hypoactive thyroid   . HYPOTHYROIDISM 05/09/2008  . Impaired glucose tolerance 01/28/2011  . INSOMNIA, HX OF 05/23/2007  . Joint pain   . LATERAL EPICONDYLITIS, RIGHT 03/10/2009  . LIPOMA 10/22/2010  . NECK PAIN 11/10/2010  . OBESITY 05/23/2007  . Osteopenia 09/28/2018  . Plantar fasciitis    Both feet  . SHOULDER PAIN, LEFT 05/17/2010  . SINUSITIS- ACUTE-NOS 11/03/2008  . SKIN LESION 11/10/2010  . Sleep apnea   . Stage III chronic kidney disease ([place])   . Unspecified Chest Pain 07/25/2008  . UNSPECIFIED URTICARIA 06/04/2009  . URI 08/28/2009  . UTI 04/29/2008  . VITAMIN D DEFICIENCY 08/28/2009  .  Wheezing 07/12/2010    Family History  Problem Relation Age of Onset  . Diabetes Cousin   . Hypertension Cousin   . Heart disease Cousin   . Heart disease Mother        Rheumatic heart disease  . Depression Mother   . COPD Father   . Hypertension Father   . Cancer Maternal Grandmother        Breast  . Diabetes Sister   . Kidney disease Sister     Past Surgical History:  Procedure Laterality Date  . COLONOSCOPY    . ENDOMETRIAL ABLATION    . LAMINECTOMY  1996  . LAPAROTOMY     exploratory  . lower back surgery    . POLYPECTOMY    . s/p endometrial ablation  12/06  . TUBAL LIGATION    . UTERINE FIBROID SURGERY    . WISDOM TOOTH EXTRACTION     Social History   Occupational History  . Occupation: SOCIAL WORKER    Employer: [employer]    Comment: [place]  Tobacco Use  . Smoking status: Former Smoker    Packs/day: 0.50    Types: Cigarettes    Quit date: 11/05/1987    Years since quitting: 31.6  . Smokeless tobacco: Never Used  Substance and Sexual Activity  . Alcohol use: No    Alcohol/week: 1.0 standard drinks    Types: 1 Glasses of wine per week    Comment: WINE  . Drug use: No  . Sexual activity: Not on file

## 2019-07-04 ENCOUNTER — Other Ambulatory Visit (INDEPENDENT_AMBULATORY_CARE_PROVIDER_SITE_OTHER): Payer: Self-pay

## 2019-07-04 ENCOUNTER — Other Ambulatory Visit (INDEPENDENT_AMBULATORY_CARE_PROVIDER_SITE_OTHER): Payer: Self-pay | Admitting: Bariatrics

## 2019-07-04 ENCOUNTER — Encounter (INDEPENDENT_AMBULATORY_CARE_PROVIDER_SITE_OTHER): Payer: Self-pay | Admitting: Bariatrics

## 2019-07-04 DIAGNOSIS — F3289 Other specified depressive episodes: Secondary | ICD-10-CM

## 2019-07-04 MED ORDER — BUPROPION HCL ER (SR) 150 MG PO TB12: 150.0000 mg | ORAL_TABLET | Freq: Every day | ORAL | 0 refills | Status: DC

## 2019-07-04 NOTE — Progress Notes (Signed)
Office: [phone]  /  Fax: [phone]   HPI:   Chief Complaint: OBESITY Suzanne Hansen is here to discuss Suzanne Hansen progress with Suzanne Hansen obesity treatment plan. She is on the Category 2 plan and is following Suzanne Hansen eating plan approximately 50% of the time. She states she is exercising 0 minutes 0 times per week. Suzanne Hansen was last seen on 12/06/2018 and has only gained 1 lb. She is here to re-establish care.   Suzanne Hansen weight is 229 lb (103.9 kg) today and has had a weight gain of 1 lb since Suzanne Hansen last visit. She has lost 12 lbs since starting treatment with [place].  Depression, Other [name] is struggling with emotional eating and using food for comfort to the extent that it is negatively impacting Suzanne Hansen health. She often snacks when she is not hungry. [name] sometimes feels she is out of control and then feels guilty that she made poor food choices. She has been working on behavior modification techniques to help reduce Suzanne Hansen emotional eating and has been somewhat successful. She shows no sign of suicidal or homicidal ideations.  Depression screen [facility] 2/9 09/28/2018 08/09/2018 05/16/2018 09/27/2017 06/14/2017  Decreased Interest 0 2 0 0 0  Down, Depressed, Hopeless 1 1 1  0 0  PHQ - 2 Score 1 3 1  0 0  Altered sleeping - 2 - - -  Tired, decreased energy - 3 - - -  Change in appetite - 1 - - -  Feeling bad or failure about yourself  - 1 - - -  Trouble concentrating - 0 - - -  Moving slowly or fidgety/restless - 2 - - -  Suicidal thoughts - 0 - - -  PHQ-9 Score - 12 - - -  Difficult doing work/chores - Somewhat difficult - - -  Some recent data might be hidden   Pre-Diabetes [name] has a diagnosis of prediabetes based on Suzanne Hansen elevated Hgb A1c and was informed this puts Suzanne Hansen at greater risk of developing diabetes. She is not taking metformin currently and continues to work on diet and exercise to decrease risk of diabetes. She denies nausea or hypoglycemia. No polyphagia.  At risk for diabetes [name] is at  higher than average risk for developing diabetes due to Suzanne Hansen obesity. She currently denies polyuria or polydipsia.  Obstructive Sleep Apnea (OSA) on CPAP [name] is on CPAP but does not use consistently.  Hyperthyroidism [name] admits to increased fatigue.  ASSESSMENT AND PLAN:  Prediabetes  Hyperthyroidism  OSA on CPAP  Other depression  At risk for diabetes mellitus  Class 2 severe obesity with serious comorbidity and body mass index (BMI) of 39.0 to 39.9 in adult, unspecified obesity type (Major)  PLAN:  Depression, Other  We discussed behavior modification techniques today to help Suzanne Hansen deal with Suzanne Hansen emotional eating and depression. Suzanne Hansen was given a prescription for bupropion (Wellbutrin) 150 mg 1 QD #30 with 0 refills. She agrees to follow-up with our clinic in 2 weeks.  Pre-Diabetes Suzanne Hansen will continue to work on weight loss, exercise, and decreasing simple carbohydrates in Suzanne Hansen diet to help decrease the risk of diabetes. We dicussed metformin including benefits and risks. She was informed that eating too many simple carbohydrates or too many calories at one sitting increases the likelihood of GI side effects. Suzanne Hansen was instructed to decrease carbohydrates, increase protein, and increase activities. She will follow-up with [place] as directed to monitor Suzanne Hansen progress.  Diabetes risk counseling [name] was given extended (15 minutes) diabetes prevention counseling today. She is  63 y.o. female and has risk factors for diabetes including obesity. We discussed intensive lifestyle modifications today with an emphasis on weight loss as well as increasing exercise and decreasing simple carbohydrates in Suzanne Hansen diet.  Obstructive Sleep Apnea (OSA) on CPAP Suzanne Hansen was encouraged to use Suzanne Hansen CPAP consistently.  Hyperthyroidism Suzanne Hansen will continue Suzanne Hansen medications and follow-up with [place] as directed.  Obesity Suzanne Hansen is currently in the action stage of change. As such, Suzanne Hansen  goal is to continue with weight loss efforts. She has agreed to follow the Category 2 plan. Suzanne Hansen will work on meal planning and intentional eating. Suzanne Hansen will begin working out with a Clinical research associate for weight loss and overall health benefits. We discussed the following Behavioral Modification Strategies today: increasing lean protein intake, decreasing simple carbohydrates, increasing vegetables, increase H20 intake, decrease eating out, no skipping meals, work on meal planning and easy cooking plans, and keeping healthy foods in the home.  Suzanne Hansen has agreed to follow-up with our clinic in 2 weeks. She will be fasting and will have IC performed. She was informed of the importance of frequent follow-up visits to maximize Suzanne Hansen success with intensive lifestyle modifications for Suzanne Hansen multiple health conditions.  ALLERGIES: Allergies  Allergen Reactions  . Clarithromycin Nausea And Vomiting  . Latex Itching and Other (See Comments)    ITCHING AND COLD SORES AROUND MOUTH WHEN LATEX GLOVES USED BY THE DENTIST/HYGENTIST  . Metronidazole Nausea And Vomiting  . Eggs Or Egg-Derived Products   . Lisinopril   . Peanut Oil   . Shellfish Allergy   . Zostavax [Zoster Vaccine Live]     MEDICATIONS: Current Outpatient Medications on File Prior to Visit  Medication Sig Dispense Refill  . albuterol (VENTOLIN HFA) 108 (90 Base) MCG/ACT inhaler Inhale 2 puffs into the lungs every 4 (four) hours as needed for wheezing or shortness of breath. 18 g 11  . allopurinol (ZYLOPRIM) 100 MG tablet Take 2 tablets (200 mg total) by mouth daily. Take 200 mg by mouth daily. 60 tablet 2  . aspirin 81 MG EC tablet Take 81 mg by mouth daily.      [name] atorvastatin (LIPITOR) 20 MG tablet Take 1 tablet (20 mg total) by mouth daily. 90 tablet 3  . budesonide-formoterol (SYMBICORT) 160-4.5 MCG/ACT inhaler INHALE 2 PUFFS INTO THE LUNGS TWICE DAILY 10.2 g 6  . buPROPion (WELLBUTRIN SR) 150 MG 12 hr tablet Take 1 tablet (150 mg  total) by mouth daily. 30 tablet 0  . Cholecalciferol (VITAMIN D) 50 MCG (2000 UT) CAPS Take by mouth.    . Cyanocobalamin (VITAMIN B-12) 1000 MCG SUBL Place 1 tablet (1,000 mcg total) under the tongue daily.  0  . cyclobenzaprine (FLEXERIL) 10 MG tablet Take 1 tablet (10 mg total) by mouth at bedtime as needed for muscle spasms. 90 tablet 3  . fexofenadine (ALLEGRA) 180 MG tablet Take 180 mg by mouth daily.      . fluticasone (FLONASE) 50 MCG/ACT nasal spray Place 2 sprays into both nostrils daily. 16 g 6  . Fluticasone-Salmeterol (ADVAIR DISKUS) 250-50 MCG/DOSE AEPB Inhale 1 puff into the lungs 2 (two) times daily. 3 each 3  . gabapentin (NEURONTIN) 100 MG capsule Take 1 capsule (100 mg total) by mouth 2 (two) times daily. 60 capsule 5  . gabapentin (NEURONTIN) 300 MG capsule TAKE 1 CAPSULE BY MOUTH THREE TIMES A DAY 90 capsule 1  . guaiFENesin (MUCINEX) 600 MG 12 hr tablet Take 2 tablets (1,200 mg total) by mouth 2 (two)  times daily as needed for to loosen phlegm. 60 tablet 1  . ibuprofen (ADVIL,MOTRIN) 600 MG tablet Take 1 tablet (600 mg total) by mouth 3 (three) times daily. 90 tablet 2  . KLOR-CON M20 20 MEQ tablet TAKE 2 TABLETS (40 MEQ TOTAL) BY MOUTH DAILY  3  . levothyroxine (SYNTHROID) 112 MCG tablet Take 1 tablet (112 mcg total) by mouth daily. 90 tablet 3  . Liniments (BLUE-EMU SUPER STRENGTH) CREA Apply 1 Dose topically 2 (two) times daily. 1 Bottle 1  . losartan (COZAAR) 25 MG tablet Take 25 mg by mouth daily. Take 1/2 tab po qd    . Multiple Vitamin (MULTIVITAMIN) capsule Take 1 capsule by mouth daily.      [name] omeprazole (PRILOSEC) 20 MG capsule Take 20 mg by mouth as needed.    . predniSONE (DELTASONE) 10 MG tablet 2 tabs by mouth per day for 5 days 18 tablet 0  . topiramate (TOPAMAX) 50 MG tablet TAKE 1 TABLET(50 MG) BY MOUTH AT BEDTIME 30 tablet 0  . torsemide (DEMADEX) 20 MG tablet 40mg  in the morning and 40mg  evening time.    . traMADol (ULTRAM) 50 MG tablet Take 1 tablet (50  mg total) by mouth every 6 (six) hours as needed. 30 tablet 0  . valACYclovir (VALTREX) 1000 MG tablet Take 500 mg by mouth daily.  0  . vitamin C (ASCORBIC ACID) 500 MG tablet Take 1 tablet (500 mg total) by mouth daily. 30 tablet    No current facility-administered medications on file prior to visit.     PAST MEDICAL HISTORY: Past Medical History:  Diagnosis Date  . ALLERGIC RHINITIS 05/09/2008  . Allergy   . ANXIETY 05/23/2007  . ARM PAIN, LEFT 10/23/2009  . Arthritis   . Asthma   . ASTHMA 05/23/2007  . Back pain   . Cardiomyopathy ([place]) 09/08/2015  . Chest pain   . CHF (congestive heart failure) ([place])   . Constipation   . CONTACT DERMATITIS 06/09/2009  . Diabetes mellitus   . ENDOMETRIOSIS NOS 05/23/2007  . FACIAL PAIN 06/02/2010  . Food allergy   . FREQUENCY, URINARY 05/17/2010  . GLUCOSE INTOLERANCE 08/28/2009  . HERPES SIMPLEX, UNCOMPLICATED [id number]  . Hyperlipidemia 08/28/2015  . HYPERTHYROIDISM 05/23/2007  . Hypoactive thyroid   . HYPOTHYROIDISM 05/09/2008  . Impaired glucose tolerance 01/28/2011  . INSOMNIA, HX OF 05/23/2007  . Joint pain   . LATERAL EPICONDYLITIS, RIGHT 03/10/2009  . LIPOMA 10/22/2010  . NECK PAIN 11/10/2010  . OBESITY 05/23/2007  . Osteopenia 09/28/2018  . Plantar fasciitis    Both feet  . SHOULDER PAIN, LEFT 05/17/2010  . SINUSITIS- ACUTE-NOS 11/03/2008  . SKIN LESION 11/10/2010  . Sleep apnea   . Stage III chronic kidney disease ([place])   . Unspecified Chest Pain 07/25/2008  . UNSPECIFIED URTICARIA 06/04/2009  . URI 08/28/2009  . UTI 04/29/2008  . VITAMIN D DEFICIENCY 08/28/2009  . Wheezing 07/12/2010    PAST SURGICAL HISTORY: Past Surgical History:  Procedure Laterality Date  . COLONOSCOPY    . ENDOMETRIAL ABLATION    . LAMINECTOMY  1996  . LAPAROTOMY     exploratory  . lower back surgery    . POLYPECTOMY    . s/p endometrial ablation  12/06  . TUBAL LIGATION    . UTERINE FIBROID SURGERY    . WISDOM TOOTH EXTRACTION      SOCIAL HISTORY:  Social History   Tobacco Use  . Smoking status: Former Smoker  Packs/day: 0.50    Types: Cigarettes    Quit date: 11/05/1987    Years since quitting: 31.6  . Smokeless tobacco: Never Used  Substance Use Topics  . Alcohol use: No    Alcohol/week: 1.0 standard drinks    Types: 1 Glasses of wine per week    Comment: WINE  . Drug use: No    FAMILY HISTORY: Family History  Problem Relation Age of Onset  . Diabetes Cousin   . Hypertension Cousin   . Heart disease Cousin   . Heart disease Mother        Rheumatic heart disease  . Depression Mother   . COPD Father   . Hypertension Father   . Cancer Maternal Grandmother        Breast  . Diabetes Sister   . Kidney disease Sister    ROS: Review of Systems  Constitutional: Positive for malaise/fatigue.  Respiratory:       Positive for OSA, on CPAP.  Gastrointestinal: Negative for nausea.  Endo/Heme/Allergies:       Negative for hypoglycemia. Negative for polyphagia.  Psychiatric/Behavioral: Positive for depression. Negative for suicidal ideas.       Negative for homicidal ideas.   PHYSICAL EXAM: Blood pressure 112/79, pulse 83, temperature 98.6 F (37 C), temperature source Oral, height 5\' 4"  (1.626 m), weight 229 lb (103.9 kg), SpO2 97 %. Body mass index is 39.31 kg/m. Physical Exam Vitals signs reviewed.  Constitutional:      Appearance: Normal appearance. She is obese.  Cardiovascular:     Rate and Rhythm: Normal rate.     Pulses: Normal pulses.  Pulmonary:     Effort: Pulmonary effort is normal.     Breath sounds: Normal breath sounds.  Musculoskeletal: Normal range of motion.  Skin:    General: Skin is warm and dry.  Neurological:     Mental Status: She is alert and oriented to person, place, and time.  Psychiatric:        Behavior: Behavior normal.   RECENT LABS AND TESTS: BMET    Component Value Date/Time   NA 141 09/28/2018 1519   NA 140 09/24/2018 1050   K 4.1 09/28/2018 1519   CL 102  09/28/2018 1519   CO2 30 09/28/2018 1519   GLUCOSE 100 (H) 09/28/2018 1519   BUN 27 (H) 09/28/2018 1519   BUN 24 09/24/2018 1050   CREATININE 1.44 (H) 09/28/2018 1519   CREATININE 0.97 09/17/2015 1113   CALCIUM 10.3 09/28/2018 1519   GFRNONAA 39 (L) 09/24/2018 1050   GFRAA 45 (L) 09/24/2018 1050   Lab Results  Component Value Date   HGBA1C 5.6 09/28/2018   HGBA1C 5.6 08/09/2018   HGBA1C 5.7 (A) 05/04/2018   HGBA1C 6.0 09/27/2017   HGBA1C 5.9 03/21/2017   Lab Results  Component Value Date   INSULIN 17.9 08/09/2018   CBC    Component Value Date/Time   WBC 8.9 09/28/2018 1519   RBC 4.50 09/28/2018 1519   HGB 13.3 09/28/2018 1519   HCT 39.4 09/28/2018 1519   PLT 282.0 09/28/2018 1519   MCV 87.6 09/28/2018 1519   MCH 31.4 11/18/2016 1554   MCHC 33.9 09/28/2018 1519   RDW 13.2 09/28/2018 1519   LYMPHSABS 2.7 09/28/2018 1519   MONOABS 0.8 09/28/2018 1519   EOSABS 0.1 09/28/2018 1519   BASOSABS 0.1 09/28/2018 1519   Iron/TIBC/Ferritin/ %Sat    Component Value Date/Time   IRON 55 03/21/2017 0935   Lipid Panel  Component Value Date/Time   CHOL 131 09/28/2018 1519   TRIG 84.0 09/28/2018 1519   HDL 77.70 09/28/2018 1519   CHOLHDL 2 09/28/2018 1519   VLDL 16.8 09/28/2018 1519   LDLCALC 37 09/28/2018 1519   Hepatic Function Panel     Component Value Date/Time   PROT 7.9 09/28/2018 1519   PROT 6.7 09/24/2018 1050   ALBUMIN 4.7 09/28/2018 1519   ALBUMIN 4.4 09/24/2018 1050   AST 17 09/28/2018 1519   ALT 17 09/28/2018 1519   ALKPHOS 103 09/28/2018 1519   BILITOT 0.4 09/28/2018 1519   BILITOT 0.5 09/24/2018 1050   BILIDIR 0.2 09/28/2018 1519      Component Value Date/Time   TSH 0.93 03/19/2019 1232   TSH 16.55 (H) 02/25/2019 1253   TSH 3.29 09/28/2018 1519   Results for VENEZIA, SAKUMA (MRN [id number]) as of 07/04/2019 09:03  Ref. Range 09/24/2018 10:50  Vitamin D, 25-Hydroxy Latest Ref Range: 30.0 - 100.0 ng/mL 35.7   OBESITY BEHAVIORAL INTERVENTION  VISIT  Today's visit was #10   Starting weight: 241 lbs Starting date: 08/09/2018 Today's weight: 229 lbs  Today's date: 07/03/2019 Total lbs lost to date: 12    07/03/2019  Height 5\' 4"  (1.626 m)  Weight 229 lb (103.9 kg)  BMI (Calculated) 39.29  BLOOD PRESSURE - SYSTOLIC [id number]  BLOOD PRESSURE - DIASTOLIC 79   Body Fat % [id number] %   ASK: We discussed the diagnosis of obesity with Mamie Laurel today and Syrina agreed to give [place] permission to discuss obesity behavioral modification therapy today.  ASSESS: Onisha has the diagnosis of obesity and Suzanne Hansen BMI today is 39.29. Khori is in the action stage of change.   ADVISE: Ziyah was educated on the multiple health risks of obesity as well as the benefit of weight loss to improve Suzanne Hansen health. She was advised of the need for long term treatment and the importance of lifestyle modifications to improve Suzanne Hansen current health and to decrease Suzanne Hansen risk of future health problems.  AGREE: Multiple dietary modification options and treatment options were discussed and  Dazaria agreed to follow the recommendations documented in the above note.  ARRANGE: Rylei was educated on the importance of frequent visits to treat obesity as outlined per CMS and USPSTF guidelines and agreed to schedule Suzanne Hansen next follow up appointment today.  Migdalia Dk, am acting as Location manager for [employer], DO  I have reviewed the above documentation for accuracy and completeness, and I agree with the above. -Jearld Lesch, DO

## 2019-07-18 ENCOUNTER — Other Ambulatory Visit: Payer: Self-pay

## 2019-07-18 ENCOUNTER — Encounter (INDEPENDENT_AMBULATORY_CARE_PROVIDER_SITE_OTHER): Payer: Self-pay | Admitting: Bariatrics

## 2019-07-18 ENCOUNTER — Ambulatory Visit (INDEPENDENT_AMBULATORY_CARE_PROVIDER_SITE_OTHER): Payer: Managed Care, Other (non HMO) | Admitting: Bariatrics

## 2019-07-18 ENCOUNTER — Encounter: Payer: Self-pay | Admitting: Bariatrics

## 2019-07-18 VITALS — BP 112/73 | HR 73 | Temp 97.6°F | Ht 64.0 in | Wt 229.0 lb

## 2019-07-18 DIAGNOSIS — R5383 Other fatigue: Secondary | ICD-10-CM | POA: Diagnosis not present

## 2019-07-18 DIAGNOSIS — F3289 Other specified depressive episodes: Secondary | ICD-10-CM

## 2019-07-18 DIAGNOSIS — E559 Vitamin D deficiency, unspecified: Secondary | ICD-10-CM | POA: Diagnosis not present

## 2019-07-18 DIAGNOSIS — R0602 Shortness of breath: Secondary | ICD-10-CM

## 2019-07-18 DIAGNOSIS — M47816 Spondylosis without myelopathy or radiculopathy, lumbar region: Secondary | ICD-10-CM | POA: Diagnosis present

## 2019-07-18 DIAGNOSIS — I10 Essential (primary) hypertension: Secondary | ICD-10-CM | POA: Insufficient documentation

## 2019-07-18 DIAGNOSIS — Z9189 Other specified personal risk factors, not elsewhere classified: Secondary | ICD-10-CM | POA: Diagnosis not present

## 2019-07-18 DIAGNOSIS — E8881 Metabolic syndrome: Secondary | ICD-10-CM | POA: Diagnosis not present

## 2019-07-18 DIAGNOSIS — Z6839 Body mass index (BMI) 39.0-39.9, adult: Secondary | ICD-10-CM

## 2019-07-18 MED ORDER — BUPROPION HCL ER (SR) 150 MG PO TB12: 150.0000 mg | ORAL_TABLET | Freq: Every day | ORAL | 0 refills | Status: DC

## 2019-07-19 ENCOUNTER — Other Ambulatory Visit: Payer: Self-pay | Admitting: Neurosurgery

## 2019-07-19 DIAGNOSIS — M47816 Spondylosis without myelopathy or radiculopathy, lumbar region: Secondary | ICD-10-CM

## 2019-07-19 LAB — COMPREHENSIVE METABOLIC PANEL
ALT: 28 IU/L (ref 0–32)
AST: 20 IU/L (ref 0–40)
Albumin/Globulin Ratio: 2 (ref 1.2–2.2)
Albumin: 4.4 g/dL (ref 3.8–4.8)
Alkaline Phosphatase: 146 IU/L — ABNORMAL HIGH (ref 39–117)
BUN/Creatinine Ratio: 18 (ref 12–28)
BUN: 26 mg/dL (ref 8–27)
Bilirubin Total: 0.4 mg/dL (ref 0.0–1.2)
CO2: 26 mmol/L (ref 20–29)
Calcium: 10.2 mg/dL (ref 8.7–10.3)
Chloride: 102 mmol/L (ref 96–106)
Creatinine, Ser: 1.41 mg/dL — ABNORMAL HIGH (ref 0.57–1.00)
GFR calc Af Amer: 46 mL/min/{1.73_m2} — ABNORMAL LOW (ref >59)
GFR calc non Af Amer: 40 mL/min/{1.73_m2} — ABNORMAL LOW (ref >59)
Globulin, Total: 2.2 g/dL (ref 1.5–4.5)
Glucose: 93 mg/dL (ref 65–99)
Potassium: 4.2 mmol/L (ref 3.5–5.2)
Sodium: 141 mmol/L (ref 134–144)
Total Protein: 6.6 g/dL (ref 6.0–8.5)

## 2019-07-19 LAB — LIPID PANEL WITH LDL/HDL RATIO
Cholesterol, Total: 148 mg/dL (ref 100–199)
HDL: 83 mg/dL (ref >39)
LDL Chol Calc (NIH): 50 mg/dL (ref 0–99)
LDL/HDL Ratio: 0.6 ratio (ref 0.0–3.2)
Triglycerides: 80 mg/dL (ref 0–149)
VLDL Cholesterol Cal: 15 mg/dL (ref 5–40)

## 2019-07-19 LAB — HEMOGLOBIN A1C
Est. average glucose Bld gHb Est-mCnc: 117 mg/dL
Hgb A1c MFr Bld: 5.7 % — ABNORMAL HIGH (ref 4.8–5.6)

## 2019-07-19 LAB — INSULIN, RANDOM: INSULIN: 20.7 u[IU]/mL (ref 2.6–24.9)

## 2019-07-19 LAB — VITAMIN D 25 HYDROXY (VIT D DEFICIENCY, FRACTURES): Vit D, 25-Hydroxy: 28 ng/mL — ABNORMAL LOW (ref 30.0–100.0)

## 2019-07-21 ENCOUNTER — Ambulatory Visit
Admission: RE | Admit: 2019-07-21 | Discharge: 2019-07-21 | Disposition: A | Discharge status: Home / Self Care | Payer: Managed Care, Other (non HMO) | Source: Ambulatory Visit | Attending: Orthopaedic Surgery | Admitting: Orthopaedic Surgery

## 2019-07-21 ENCOUNTER — Other Ambulatory Visit: Payer: Self-pay

## 2019-07-21 DIAGNOSIS — M25562 Pain in left knee: Secondary | ICD-10-CM

## 2019-07-21 DIAGNOSIS — G8929 Other chronic pain: Secondary | ICD-10-CM

## 2019-07-21 NOTE — Progress Notes (Signed)
Office: [phone]  /  Fax: [phone]   HPI:   Chief Complaint: OBESITY Suzanne Hansen is here to discuss her progress with her obesity treatment plan. She is on the Category 2 plan and is following her eating plan approximately 90 % of the time. She states she is lifting weights for 45 minutes 2 times per week. Suzanne Hansen is doing well and her weight has remind the same. She is on fluid restrictions and up to 64 onces.  Her weight is 229 lb (103.9 kg) today and has not lost weight since her last visit. She has lost 12 lbs since starting treatment with [place].  Fatigue Suzanne Hansen feels her energy is lower than it should be. This has worsened with weight gain and has not worsened recently. [name] admits to daytime somnolence.  Shortness of Breath with Exertion Suzanne Hansen notes increasing shortness of breath with certain activities and seems to be worsening over time with weight gain. She notes getting out of breath sooner with activity than she used to. This has not gotten worse recently. Last IC was 1848 on 10/19. Suzanne Hansen denies shortness of breath at rest or orthopnea.  Insulin Resistance Suzanne Hansen has a diagnosis of insulin resistance based on her elevated fasting insulin level >5. Although Suzanne Hansen's blood glucose readings are still under good control, insulin resistance puts her at greater risk of metabolic syndrome and diabetes. She is taking metformin currently and denies polyphagia. She continues to work on diet and exercise to decrease risk of diabetes.  At risk for diabetes Suzanne Hansen is at higher than average risk for developing diabetes due to her obesity and insulin resistance. She currently denies polyuria or polydipsia.  Vitamin D Deficiency Suzanne Hansen has a diagnosis of vitamin D deficiency. She is currently taking Vit D 2,000 IU daily and denies nausea, vomiting or muscle weakness.  Depression with Emotional Eating Behaviors Suzanne Hansen is struggling with emotional eating and using food  for comfort to the extent that it is negatively impacting her health. She often snacks when she is not hungry. Suzanne Hansen sometimes feels she is out of control and then feels guilty that she made poor food choices. She has been working on behavior modification techniques to help reduce her emotional eating and has been somewhat successful. She shows no sign of suicidal or homicidal ideations.  Depression screen Suzanne Hansen 2/9 09/28/2018 08/09/2018 05/16/2018 09/27/2017 06/14/2017  Decreased Interest 0 2 0 0 0  Down, Depressed, Hopeless 1 1 1  0 0  PHQ - 2 Score 1 3 1  0 0  Altered sleeping - 2 - - -  Tired, decreased energy - 3 - - -  Change in appetite - 1 - - -  Feeling bad or failure about yourself  - 1 - - -  Trouble concentrating - 0 - - -  Moving slowly or fidgety/restless - 2 - - -  Suicidal thoughts - 0 - - -  PHQ-9 Score - 12 - - -  Difficult doing work/chores - Somewhat difficult - - -  Some recent data might be hidden    ASSESSMENT AND PLAN:  Other fatigue - Plan: Comprehensive metabolic panel, Lipid Panel With LDL/HDL Ratio  Shortness of breath on exertion - Plan: Lipid Panel With LDL/HDL Ratio  Insulin resistance - Plan: Comprehensive metabolic panel, Hemoglobin A1c, Insulin, random  Vitamin D deficiency - Plan: VITAMIN D 25 Hydroxy (Vit-D Deficiency, Fractures)  Other depression - With emotional eating  - Plan: buPROPion (WELLBUTRIN SR) 150 MG 12 hr tablet  At risk  for diabetes mellitus  Class 2 severe obesity with serious comorbidity and body mass index (BMI) of 39.0 to 39.9 in adult, unspecified obesity type ([place])  PLAN:  Fatigue Suzanne Hansen was informed that her fatigue may be related to obesity, depression or many other causes. Labs will be ordered, and in the meanwhile Suzanne Hansen has agreed to work on diet, exercise and weight loss to help with fatigue. Proper sleep hygiene was discussed including the need for 7-8 hours of quality sleep each night.  Shortness of Breath with  Exertion Suzanne Hansen's shortness of breath appears to be obesity related and exercise induced. The indirect calorimeter results showed VO2 of 263 and a REE of 1828. She has agreed to work on weight loss and gradually increase exercise to treat her exercise induced shortness of breath. If Suzanne Hansen follows our instructions and loses weight without improvement of her shortness of breath, we will plan to refer to pulmonology. Suzanne Hansen agrees to this plan.  Insulin Resistance Suzanne Hansen will continue to work on weight loss, exercise, and decreasing simple carbohydrates in her diet to help decrease the risk of diabetes. We dicussed metformin including benefits and risks. She was informed that eating too many simple carbohydrates or too many calories at one sitting increases the likelihood of GI side effects. We will check insulin and Hgb A1c today. Suzanne Hansen agrees to follow up with [place] as directed to monitor her progress.  Diabetes risk counseling Suzanne Hansen was given extended (15 minutes) diabetes prevention counseling today. She is 63 y.o. female and has risk factors for diabetes including obesity and insulin resistance. We discussed intensive lifestyle modifications today with an emphasis on weight loss as well as increasing exercise and decreasing simple carbohydrates in her diet.  Vitamin D Deficiency Suzanne Hansen was informed that low vitamin D levels contributes to fatigue and are associated with obesity, breast, and colon cancer. Suzanne Hansen agrees to continue taking Vit D 2,000 IU and will follow up for routine testing of vitamin D, at least 2-3 times per year. She was informed of the risk of over-replacement of vitamin D and agrees to not increase her dose unless she discusses this with [place] first. We will check Vit D today. Suzanne Hansen agrees to follow up with our clinic in 2 weeks.  Depression with Emotional Eating Behaviors We discussed behavior modification techniques today to help Suzanne Hansen deal with her emotional  eating and depression. Suzanne Hansen agrees to continue taking Wellbutrin SR 150 mg 1 tablet PO daily #30 and we will refill for 1 month. Suzanne Hansen agrees to follow up with our clinic in 2 weeks.  Obesity Suzanne Hansen is currently in the action stage of change. As such, her goal is to continue with weight loss efforts She has agreed to follow a lower carbohydrate, vegetable and lean protein rich diet plan for 2 weeks only Suzanne Hansen has been instructed to work up to a goal of 150 minutes of combined cardio and strengthening exercise per week for weight loss and overall health benefits. We discussed the following Behavioral Modification Strategies today: increasing lean protein intake, decreasing simple carbohydrates, increasing vegetables, decrease eating out, increase H20 intake, no skipping meals, work on meal planning and easy cooking plans, and keeping healthy foods in the home Suzanne Hansen is to get at least 64 ounces of water in   Suzanne Hansen has agreed to follow up with our clinic in 2 weeks. She was informed of the importance of frequent follow up visits to maximize her success with intensive lifestyle modifications for her multiple health conditions.  ALLERGIES: Allergies  Allergen Reactions   Clarithromycin Nausea And Vomiting   Latex Itching and Other (See Comments)    ITCHING AND COLD SORES AROUND MOUTH WHEN LATEX GLOVES USED BY THE DENTIST/HYGENTIST   Metronidazole Nausea And Vomiting   Eggs Or Egg-Derived Products    Lisinopril    Peanut Oil    Shellfish Allergy    Zostavax [Zoster Vaccine Live]     MEDICATIONS: Current Outpatient Medications on File Prior to Visit  Medication Sig Dispense Refill   albuterol (VENTOLIN HFA) 108 (90 Base) MCG/ACT inhaler Inhale 2 puffs into the lungs every 4 (four) hours as needed for wheezing or shortness of breath. 18 g 11   allopurinol (ZYLOPRIM) 100 MG tablet Take 2 tablets (200 mg total) by mouth daily. Take 200 mg by mouth daily. 60 tablet 2     aspirin 81 MG EC tablet Take 81 mg by mouth daily.       atorvastatin (LIPITOR) 20 MG tablet Take 1 tablet (20 mg total) by mouth daily. 90 tablet 3   budesonide-formoterol (SYMBICORT) 160-4.5 MCG/ACT inhaler INHALE 2 PUFFS INTO THE LUNGS TWICE DAILY 10.2 g 6   Cholecalciferol (VITAMIN D) 50 MCG (2000 UT) CAPS Take by mouth.     Cyanocobalamin (VITAMIN B-12) 1000 MCG SUBL Place 1 tablet (1,000 mcg total) under the tongue daily.  0   cyclobenzaprine (FLEXERIL) 10 MG tablet Take 1 tablet (10 mg total) by mouth at bedtime as needed for muscle spasms. 90 tablet 3   fexofenadine (ALLEGRA) 180 MG tablet Take 180 mg by mouth daily.       fluticasone (FLONASE) 50 MCG/ACT nasal spray Place 2 sprays into both nostrils daily. 16 g 6   Fluticasone-Salmeterol (ADVAIR DISKUS) 250-50 MCG/DOSE AEPB Inhale 1 puff into the lungs 2 (two) times daily. 3 each 3   gabapentin (NEURONTIN) 100 MG capsule Take 1 capsule (100 mg total) by mouth 2 (two) times daily. 60 capsule 5   gabapentin (NEURONTIN) 300 MG capsule TAKE 1 CAPSULE BY MOUTH THREE TIMES A DAY 90 capsule 1   guaiFENesin (MUCINEX) 600 MG 12 hr tablet Take 2 tablets (1,200 mg total) by mouth 2 (two) times daily as needed for to loosen phlegm. 60 tablet 1   ibuprofen (ADVIL,MOTRIN) 600 MG tablet Take 1 tablet (600 mg total) by mouth 3 (three) times daily. 90 tablet 2   KLOR-CON M20 20 MEQ tablet TAKE 2 TABLETS (40 MEQ TOTAL) BY MOUTH DAILY  3   levothyroxine (SYNTHROID) 112 MCG tablet Take 1 tablet (112 mcg total) by mouth daily. 90 tablet 3   Liniments (BLUE-EMU SUPER STRENGTH) CREA Apply 1 Dose topically 2 (two) times daily. 1 Bottle 1   losartan (COZAAR) 25 MG tablet Take 25 mg by mouth daily. Take 1/2 tab po qd     Multiple Vitamin (MULTIVITAMIN) capsule Take 1 capsule by mouth daily.       omeprazole (PRILOSEC) 20 MG capsule Take 20 mg by mouth as needed.     predniSONE (DELTASONE) 10 MG tablet 2 tabs by mouth per day for 5 days 18  tablet 0   topiramate (TOPAMAX) 50 MG tablet TAKE 1 TABLET(50 MG) BY MOUTH AT BEDTIME 30 tablet 0   torsemide (DEMADEX) 20 MG tablet 40mg  in the morning and 40mg  evening time.     traMADol (ULTRAM) 50 MG tablet Take 1 tablet (50 mg total) by mouth every 6 (six) hours as needed. 30 tablet 0   valACYclovir (VALTREX) 1000 MG tablet  Take 500 mg by mouth daily.  0   vitamin C (ASCORBIC ACID) 500 MG tablet Take 1 tablet (500 mg total) by mouth daily. 30 tablet    No current facility-administered medications on file prior to visit.     PAST MEDICAL HISTORY: Past Medical History:  Diagnosis Date   ALLERGIC RHINITIS 05/09/2008   Allergy    ANXIETY 05/23/2007   ARM PAIN, LEFT 10/23/2009   Arthritis    Asthma    ASTHMA 05/23/2007   Back pain    Cardiomyopathy ([place]) 09/08/2015   Chest pain    CHF (congestive heart failure) ([place])    Constipation    CONTACT DERMATITIS 06/09/2009   Diabetes mellitus    ENDOMETRIOSIS NOS 05/23/2007   FACIAL PAIN 06/02/2010   Food allergy    FREQUENCY, URINARY 05/17/2010   GLUCOSE INTOLERANCE 08/28/2009   HERPES SIMPLEX, UNCOMPLICATED [id number]   Hyperlipidemia 08/28/2015   HYPERTHYROIDISM 05/23/2007   Hypoactive thyroid    HYPOTHYROIDISM 05/09/2008   Impaired glucose tolerance 01/28/2011   INSOMNIA, HX OF 05/23/2007   Joint pain    LATERAL EPICONDYLITIS, RIGHT 03/10/2009   LIPOMA 10/22/2010   NECK PAIN 11/10/2010   OBESITY 05/23/2007   Osteopenia 09/28/2018   Plantar fasciitis    Both feet   SHOULDER PAIN, LEFT 05/17/2010   SINUSITIS- ACUTE-NOS 11/03/2008   SKIN LESION 11/10/2010   Sleep apnea    Stage III chronic kidney disease    Unspecified Chest Pain 07/25/2008   UNSPECIFIED URTICARIA 06/04/2009   URI 08/28/2009   UTI 04/29/2008   VITAMIN D DEFICIENCY 08/28/2009   Wheezing 07/12/2010    PAST SURGICAL HISTORY: Past Surgical History:  Procedure Laterality Date   COLONOSCOPY     ENDOMETRIAL ABLATION      LAMINECTOMY  1996   LAPAROTOMY     exploratory   lower back surgery     POLYPECTOMY     s/p endometrial ablation  12/06   TUBAL LIGATION     UTERINE FIBROID SURGERY     WISDOM TOOTH EXTRACTION      SOCIAL HISTORY: Social History   Tobacco Use   Smoking status: Former Smoker    Packs/day: 0.50    Types: Cigarettes    Quit date: 11/05/1987    Years since quitting: 31.7   Smokeless tobacco: Never Used  Substance Use Topics   Alcohol use: No    Alcohol/week: 1.0 standard drinks    Types: 1 Glasses of wine per week    Comment: WINE   Drug use: No    FAMILY HISTORY: Family History  Problem Relation Age of Onset   Diabetes Cousin    Hypertension Cousin    Heart disease Cousin    Heart disease Mother        Rheumatic heart disease   Depression Mother    COPD Father    Hypertension Father    Cancer Maternal Grandmother        Breast   Diabetes Sister    Kidney disease Sister     ROS: Review of Systems  Constitutional: Positive for malaise/fatigue. Negative for weight loss.  Respiratory: Positive for shortness of breath (with exertion).   Gastrointestinal: Negative for nausea and vomiting.  Genitourinary: Negative for frequency.  Musculoskeletal:       Negative muscle weakness  Endo/Heme/Allergies: Negative for polydipsia.       Negative polyphagia  Psychiatric/Behavioral: Positive for depression. Negative for suicidal ideas.    PHYSICAL EXAM: Blood pressure 112/73, pulse 73, temperature  97.6 F (36.4 C), temperature source Oral, height 5\' 4"  (1.626 m), weight 229 lb (103.9 kg), SpO2 99 %. Body mass index is 39.31 kg/m. Physical Exam Vitals signs reviewed.  Constitutional:      Appearance: Normal appearance. She is obese.  Cardiovascular:     Rate and Rhythm: Normal rate.     Pulses: Normal pulses.  Pulmonary:     Effort: Pulmonary effort is normal.     Breath sounds: Normal breath sounds.  Musculoskeletal: Normal range of motion.    Skin:    General: Skin is warm and dry.  Neurological:     Mental Status: She is alert and oriented to person, place, and time.  Psychiatric:        Mood and Affect: Mood normal.        Behavior: Behavior normal.     RECENT LABS AND TESTS: BMET    Component Value Date/Time   NA 141 07/18/2019 0900   K 4.2 07/18/2019 0900   CL 102 07/18/2019 0900   CO2 26 07/18/2019 0900   GLUCOSE 93 07/18/2019 0900   GLUCOSE 100 (H) 09/28/2018 1519   BUN 26 07/18/2019 0900   CREATININE 1.41 (H) 07/18/2019 0900   CREATININE 0.97 09/17/2015 1113   CALCIUM 10.2 07/18/2019 0900   GFRNONAA 40 (L) 07/18/2019 0900   GFRAA 46 (L) 07/18/2019 0900   Lab Results  Component Value Date   HGBA1C 5.7 (H) 07/18/2019   HGBA1C 5.6 09/28/2018   HGBA1C 5.6 08/09/2018   HGBA1C 5.7 (A) 05/04/2018   HGBA1C 6.0 09/27/2017   Lab Results  Component Value Date   INSULIN 20.7 07/18/2019   INSULIN 17.9 08/09/2018   CBC    Component Value Date/Time   WBC 8.9 09/28/2018 1519   RBC 4.50 09/28/2018 1519   HGB 13.3 09/28/2018 1519   HCT 39.4 09/28/2018 1519   PLT 282.0 09/28/2018 1519   MCV 87.6 09/28/2018 1519   MCH 31.4 11/18/2016 1554   MCHC 33.9 09/28/2018 1519   RDW 13.2 09/28/2018 1519   LYMPHSABS 2.7 09/28/2018 1519   MONOABS 0.8 09/28/2018 1519   EOSABS 0.1 09/28/2018 1519   BASOSABS 0.1 09/28/2018 1519   Iron/TIBC/Ferritin/ %Sat    Component Value Date/Time   IRON 55 03/21/2017 0935   Lipid Panel     Component Value Date/Time   CHOL 148 07/18/2019 0900   TRIG 80 07/18/2019 0900   HDL 83 07/18/2019 0900   CHOLHDL 2 09/28/2018 1519   VLDL 16.8 09/28/2018 1519   LDLCALC 50 07/18/2019 0900   Hepatic Function Panel     Component Value Date/Time   PROT 6.6 07/18/2019 0900   ALBUMIN 4.4 07/18/2019 0900   AST 20 07/18/2019 0900   ALT 28 07/18/2019 0900   ALKPHOS 146 (H) 07/18/2019 0900   BILITOT 0.4 07/18/2019 0900   BILIDIR 0.2 09/28/2018 1519      Component Value Date/Time    TSH 0.93 03/19/2019 1232   TSH 16.55 (H) 02/25/2019 1253   TSH 3.29 09/28/2018 1519      OBESITY BEHAVIORAL INTERVENTION VISIT  Today's visit was # 11   Starting weight: 241 lbs Starting date: 08/09/18 Today's weight : 229 lbs  Today's date: 07/18/2019 Total lbs lost to date: 12    ASK: We discussed the diagnosis of obesity with Mamie Laurel today and Adalena agreed to give [place] permission to discuss obesity behavioral modification therapy today.  ASSESS: Aikam has the diagnosis of obesity and her BMI today is  39.29 Deletha is in the action stage of change   ADVISE: Thetis was educated on the multiple health risks of obesity as well as the benefit of weight loss to improve her health. She was advised of the need for long term treatment and the importance of lifestyle modifications to improve her current health and to decrease her risk of future health problems.  AGREE: Multiple dietary modification options and treatment options were discussed and  Lashona agreed to follow the recommendations documented in the above note.  ARRANGE: Alexas was educated on the importance of frequent visits to treat obesity as outlined per CMS and USPSTF guidelines and agreed to schedule her next follow up appointment today.  Wilhemena Durie, am acting as transcriptionist for [employer], DO  I have reviewed the above documentation for accuracy and completeness, and I agree with the above. -Jearld Lesch, DO

## 2019-07-23 ENCOUNTER — Encounter (INDEPENDENT_AMBULATORY_CARE_PROVIDER_SITE_OTHER): Payer: Self-pay | Admitting: Bariatrics

## 2019-07-24 ENCOUNTER — Telehealth: Payer: Self-pay

## 2019-07-24 ENCOUNTER — Ambulatory Visit (INDEPENDENT_AMBULATORY_CARE_PROVIDER_SITE_OTHER): Payer: Managed Care, Other (non HMO) | Admitting: Orthopaedic Surgery

## 2019-07-24 ENCOUNTER — Encounter: Payer: Self-pay | Admitting: Orthopaedic Surgery

## 2019-07-24 ENCOUNTER — Ambulatory Visit (INDEPENDENT_AMBULATORY_CARE_PROVIDER_SITE_OTHER): Payer: Managed Care, Other (non HMO)

## 2019-07-24 ENCOUNTER — Other Ambulatory Visit: Payer: Self-pay

## 2019-07-24 DIAGNOSIS — M7061 Trochanteric bursitis, right hip: Secondary | ICD-10-CM

## 2019-07-24 DIAGNOSIS — M25562 Pain in left knee: Secondary | ICD-10-CM

## 2019-07-24 DIAGNOSIS — M1712 Unilateral primary osteoarthritis, left knee: Secondary | ICD-10-CM

## 2019-07-24 DIAGNOSIS — M25551 Pain in right hip: Secondary | ICD-10-CM

## 2019-07-24 DIAGNOSIS — G8929 Other chronic pain: Secondary | ICD-10-CM

## 2019-07-24 MED ORDER — LIDOCAINE HCL 1 % IJ SOLN
3.0000 mL | INTRAMUSCULAR | Status: AC | PRN
  Administered 2019-07-24: 3 mL

## 2019-07-24 MED ORDER — METHYLPREDNISOLONE ACETATE 40 MG/ML IJ SUSP
40.0000 mg | INTRAMUSCULAR | Status: AC | PRN
  Administered 2019-07-24: 40 mg via INTRA_ARTICULAR

## 2019-07-24 NOTE — Progress Notes (Signed)
Office Visit Note   Patient: Suzanne Hansen           Date of Birth: [birth year]           MRN: [id number] Visit Date: 07/24/2019              Requested by: Biagio Borg, MD [place],  [place] [address] PCP: Biagio Borg, MD   Assessment & Plan: Visit Diagnoses:  1. Pain in right hip   2. Chronic pain of left knee   3. Unilateral primary osteoarthritis, left knee   4. Trochanteric bursitis, right hip     Plan: I shared with the patient her MRI findings of her left knee.  She does have quite significant arthritis in the left knee and it likely flared things up after she had a mechanical fall.  My next recommendation of her left knee would be ordering hyaluronic acid for the left knee to treat the pain from osteoarthritis given the fact that other conservative treatment measures have failed including the failure of a steroid injection.  I did recommend a steroid shot in her right hip trochanteric area based on her trochanteric bursitis.  She agreed with trying this and tolerated it well.  Also showed her stretching exercises for trochanteric bursitis as well.  As far as her lumbar spine goes, she will follow-up with neurosurgery for this since she has an MRI that is being ordered for lumbar spine.  We will see her back in 4 weeks to hopefully place hyaluronic acid into the left knee.  Follow-Up Instructions: Return in about 4 weeks (around 08/21/2019).   Orders:  Orders Placed This Encounter  Procedures  . Large Joint Inj  . XR HIP UNILAT W OR W/O PELVIS 2-3 VIEWS RIGHT   No orders of the defined types were placed in this encounter.     Procedures: Large Joint Inj: R greater trochanter on 07/24/2019 10:42 AM Indications: pain and diagnostic evaluation Details: 22 G 1.5 in needle, lateral approach  Arthrogram: No  Medications: 3 mL lidocaine 1 %; 40 mg methylPREDNISolone acetate 40 MG/ML Outcome: tolerated well, no immediate complications Procedure,  treatment alternatives, risks and benefits explained, specific risks discussed. Consent was given by the patient. Immediately prior to procedure a time out was called to verify the correct patient, procedure, equipment, support staff and site/side marked as required. Patient was prepped and draped in the usual sterile fashion.       Clinical Data: No additional findings.   Subjective: Chief Complaint  Patient presents with  . Left Knee - Follow-up  The patient is a very pleasant 63 year old female who is coming for follow-up after having an MRI of her left knee.  She had injured this knee and mechanical fall this summer and is gotten significantly worse.  We tried conservative treatment with activity modification, she is worked on strengthening her knee through working with a Physiological scientist.  She is tried weight loss anti-inflammatories.  We then placed a steroid injection in her left knee and this did not help.  With continued pain with locking catching we sent her for an MRI.  She is now reporting severe right hip pain.  She actually sees Dr. Saintclair Halsted with neurosurgery and they have authorized an MRI of her lumbar spine.  She has had facet joint injections mainly on the right in the past.  She is continuing to follow up with neurosurgery for her spine.  HPI  Review  of Systems She currently denies any headache, chest pain, shortness of breath, fever, chills, nausea, vomiting  Objective: Vital Signs: There were no vitals taken for this visit.  Physical Exam She is alert and orient x3 and in no acute distress Ortho Exam Examination of her right hip shows that it moves smoothly and fluidly with only pain that is severe over the trochanteric area but not down the rest of her leg.  There is some low back pain as well.  Examination of her left knee does show some slight valgus malalignment with patellofemoral crepitation and lateral joint line pain and tenderness. Specialty Comments:  No  specialty comments available.  Imaging: Xr Hip Unilat W Or W/o Pelvis 2-3 Views Right  Result Date: 07/24/2019 An AP pelvis and lateral of the right hip shows no acute findings.  The hip joint is well located.  There is no cortical irregularities around the hip or the trochanteric area.  The MRI of her left knee is independently reviewed and does show significant wear of the cartilage of the patellofemoral joint and the lateral compartment of her knee.  There is no meniscal tear and the ligaments are all intact.  PMFS History: Patient Active Problem List   Diagnosis Date Noted  . Effusion of left knee 05/31/2019  . Pain and swelling of left lower leg 05/31/2019  . Left otitis media 12/17/2018  . Asthma exacerbation 12/17/2018  . At risk for diabetes mellitus 11/08/2018  . Arthritis 10/29/2018  . Cervical radiculopathy at C6 10/23/2018  . Osteopenia 09/28/2018  . Elevated blood uric acid level 09/28/2018  . Polyarthralgia 09/28/2018  . Paroxysmal atrial fibrillation ([place]) 09/18/2018  . CHF (congestive heart failure), NYHA class I, unspecified failure chronicity, systolic ([place]) [id number]  . Prediabetes 08/13/2018  . OSA on CPAP 08/13/2018  . CKD (chronic kidney disease) stage 3, GFR 30-59 ml/min 06/26/2018  . Rash 06/26/2018  . Wheezing 06/26/2018  . Left hip pain 05/27/2018  . Chronic systolic (congestive) heart failure ([place]) 05/16/2018  . Bicipital tendinitis of right shoulder 05/16/2018  . Bursitis of left hip 05/16/2018  . GERD (gastroesophageal reflux disease) 09/27/2017  . Acute shoulder bursitis, left 08/28/2017  . Lumbosacral radiculopathy at L4 06/14/2017  . 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 Easy bruising 03/29/2017  . Left leg paresthesias 03/29/2017  . Muscle cramping 03/20/2017  . 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 Pain in lower jaw 11/10/2016  . Cough 11/06/2016  . Vaginitis 09/17/2016  . Left wrist  pain 09/16/2016  . Hypercoagulable state ([place]) 06/09/2016  . Iron deficiency 06/09/2016  . Lumbar disc disease 06/09/2016  . Hypersomnia with sleep apnea 02/15/2016  . 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 Extrinsic asthma 02/15/2016  . Ankle edema 02/15/2016  . Degenerative arthritis of knee, bilateral 01/21/2016  . Asthma with exacerbation 10/20/2015  . Acute sinus infection 09/29/2015  . Cardiomyopathy ([place]) 09/08/2015  . Bilateral calf pain 08/31/2015  . Hyperlipidemia 08/28/2015  . Left knee pain 08/28/2015  . Varicose veins with pain 08/28/2015  . Peripheral edema 08/13/2015  . Injection site reaction 06/02/2015  . PHN (postherpetic neuralgia) 05/29/2015  . Unspecified asthma, with exacerbation 07/01/2014  . Shingles 06/10/2014  . Skin nodule 11/27/2012  . Encounter for well adult exam with abnormal findings 01/28/2011  . LIPOMA 10/22/2010  . VITAMIN D DEFICIENCY 08/28/2009  . Hypothyroidism 05/09/2008  . Allergic rhinitis 05/09/2008  . HERPES ZOSTER W/NERVOUS COMPLICATION NEC [id number]  . HYPERTHYROIDISM  05/23/2007  . OBESITY 05/23/2007  . Anxiety state 05/23/2007  . Asthma 05/23/2007  . ENDOMETRIOSIS NOS 05/23/2007  . INSOMNIA, HX OF 05/23/2007   Past Medical History:  Diagnosis Date  . ALLERGIC RHINITIS 05/09/2008  . Allergy   . ANXIETY 05/23/2007  . ARM PAIN, LEFT 10/23/2009  . Arthritis   . Asthma   . ASTHMA 05/23/2007  . Back pain   . Cardiomyopathy ([place]) 09/08/2015  . Chest pain   . CHF (congestive heart failure) ([place])   . Constipation   . CONTACT DERMATITIS 06/09/2009  . Diabetes mellitus   . ENDOMETRIOSIS NOS 05/23/2007  . FACIAL PAIN 06/02/2010  . Food allergy   . FREQUENCY, URINARY 05/17/2010  . GLUCOSE INTOLERANCE 08/28/2009  . HERPES SIMPLEX, UNCOMPLICATED [id number]  . Hyperlipidemia 08/28/2015  . HYPERTHYROIDISM 05/23/2007  . Hypoactive thyroid   . HYPOTHYROIDISM 05/09/2008  . Impaired glucose tolerance 01/28/2011  . INSOMNIA, HX OF  05/23/2007  . Joint pain   . LATERAL EPICONDYLITIS, RIGHT 03/10/2009  . LIPOMA 10/22/2010  . NECK PAIN 11/10/2010  . OBESITY 05/23/2007  . Osteopenia 09/28/2018  . Plantar fasciitis    Both feet  . SHOULDER PAIN, LEFT 05/17/2010  . SINUSITIS- ACUTE-NOS 11/03/2008  . SKIN LESION 11/10/2010  . Sleep apnea   . Stage III chronic kidney disease   . Unspecified Chest Pain 07/25/2008  . UNSPECIFIED URTICARIA 06/04/2009  . URI 08/28/2009  . UTI 04/29/2008  . VITAMIN D DEFICIENCY 08/28/2009  . Wheezing 07/12/2010    Family History  Problem Relation Age of Onset  . Diabetes Cousin   . Hypertension Cousin   . Heart disease Cousin   . Heart disease Mother        Rheumatic heart disease  . Depression Mother   . COPD Father   . Hypertension Father   . Cancer Maternal Grandmother        Breast  . Diabetes Sister   . Kidney disease Sister     Past Surgical History:  Procedure Laterality Date  . COLONOSCOPY    . ENDOMETRIAL ABLATION    . LAMINECTOMY  1996  . LAPAROTOMY     exploratory  . lower back surgery    . POLYPECTOMY    . s/p endometrial ablation  12/06  . TUBAL LIGATION    . UTERINE FIBROID SURGERY    . WISDOM TOOTH EXTRACTION     Social History   Occupational History  . Occupation: SOCIAL WORKER    Employer: [employer]    Comment: [employer]  Tobacco Use  . Smoking status: Former Smoker    Packs/day: 0.50    Types: Cigarettes    Quit date: 11/05/1987    Years since quitting: 31.7  . Smokeless tobacco: Never Used  Substance and Sexual Activity  . Alcohol use: No    Alcohol/week: 1.0 standard drinks    Types: 1 Glasses of wine per week    Comment: WINE  . Drug use: No  . Sexual activity: Not on file

## 2019-07-24 NOTE — Telephone Encounter (Signed)
Left knee gel injection ?

## 2019-07-24 NOTE — Telephone Encounter (Signed)
Noted  

## 2019-07-25 ENCOUNTER — Other Ambulatory Visit: Payer: Managed Care, Other (non HMO)

## 2019-07-26 ENCOUNTER — Other Ambulatory Visit: Payer: Self-pay | Admitting: Neurosurgery

## 2019-07-26 ENCOUNTER — Other Ambulatory Visit: Payer: Self-pay | Admitting: Orthopaedic Surgery

## 2019-07-26 ENCOUNTER — Telehealth: Payer: Self-pay | Admitting: Orthopaedic Surgery

## 2019-07-26 DIAGNOSIS — M47816 Spondylosis without myelopathy or radiculopathy, lumbar region: Secondary | ICD-10-CM

## 2019-07-26 MED ORDER — METHYLPREDNISOLONE 4 MG PO TABS: ORAL_TABLET | ORAL | 0 refills | Status: DC

## 2019-07-26 NOTE — Telephone Encounter (Signed)
Please advise 

## 2019-07-26 NOTE — Telephone Encounter (Signed)
Patient called. Says she is still having a lot of pain. She would like something else for pain. Prednisone is what she would like called in for her.  Her call back number is [phone]

## 2019-07-26 NOTE — Telephone Encounter (Signed)
I sent in a prednisone taper

## 2019-07-26 NOTE — Telephone Encounter (Signed)
Left message

## 2019-07-29 ENCOUNTER — Telehealth: Payer: Self-pay

## 2019-07-29 NOTE — Telephone Encounter (Signed)
Submitted VOB for Monovisc, left knee. 

## 2019-08-05 ENCOUNTER — Other Ambulatory Visit: Payer: Self-pay | Admitting: Obstetrics and Gynecology

## 2019-08-05 ENCOUNTER — Ambulatory Visit (INDEPENDENT_AMBULATORY_CARE_PROVIDER_SITE_OTHER): Payer: Managed Care, Other (non HMO) | Admitting: Bariatrics

## 2019-08-05 DIAGNOSIS — R928 Other abnormal and inconclusive findings on diagnostic imaging of breast: Secondary | ICD-10-CM

## 2019-08-09 ENCOUNTER — Other Ambulatory Visit: Payer: Self-pay | Admitting: Neurosurgery

## 2019-08-09 ENCOUNTER — Other Ambulatory Visit (INDEPENDENT_AMBULATORY_CARE_PROVIDER_SITE_OTHER): Payer: Self-pay | Admitting: Bariatrics

## 2019-08-09 DIAGNOSIS — F3289 Other specified depressive episodes: Secondary | ICD-10-CM

## 2019-08-12 ENCOUNTER — Other Ambulatory Visit: Payer: Self-pay | Admitting: Neurosurgery

## 2019-08-12 DIAGNOSIS — M47816 Spondylosis without myelopathy or radiculopathy, lumbar region: Secondary | ICD-10-CM

## 2019-08-15 ENCOUNTER — Telehealth: Payer: Self-pay | Admitting: Orthopaedic Surgery

## 2019-08-15 NOTE — Telephone Encounter (Signed)
Yes, she's good to go

## 2019-08-15 NOTE — Telephone Encounter (Signed)
This was for Monovisc, can you advise?

## 2019-08-15 NOTE — Telephone Encounter (Signed)
Pt called in checking to see if her gel injection was approved for her appt on 11/18?   [phone]

## 2019-08-15 NOTE — Telephone Encounter (Signed)
She is approved for Monovisc Left knee, 1 Unit. Auth # [id number], valid 08/09/19 thru 08/30/19.

## 2019-08-19 ENCOUNTER — Other Ambulatory Visit: Payer: Managed Care, Other (non HMO)

## 2019-08-20 ENCOUNTER — Other Ambulatory Visit: Payer: Managed Care, Other (non HMO)

## 2019-08-21 ENCOUNTER — Ambulatory Visit: Payer: Managed Care, Other (non HMO) | Admitting: Orthopaedic Surgery

## 2019-08-26 ENCOUNTER — Other Ambulatory Visit: Payer: Managed Care, Other (non HMO)

## 2019-08-27 ENCOUNTER — Ambulatory Visit (INDEPENDENT_AMBULATORY_CARE_PROVIDER_SITE_OTHER): Payer: Managed Care, Other (non HMO) | Admitting: Bariatrics

## 2019-08-28 ENCOUNTER — Ambulatory Visit: Payer: Managed Care, Other (non HMO) | Admitting: Orthopaedic Surgery

## 2019-09-02 ENCOUNTER — Ambulatory Visit
Admission: RE | Admit: 2019-09-02 | Discharge: 2019-09-02 | Disposition: A | Discharge status: Home / Self Care | Payer: Managed Care, Other (non HMO) | Source: Ambulatory Visit | Attending: Obstetrics and Gynecology | Admitting: Obstetrics and Gynecology

## 2019-09-02 ENCOUNTER — Other Ambulatory Visit: Payer: Self-pay | Admitting: Internal Medicine

## 2019-09-02 ENCOUNTER — Other Ambulatory Visit: Payer: Self-pay

## 2019-09-02 DIAGNOSIS — R928 Other abnormal and inconclusive findings on diagnostic imaging of breast: Secondary | ICD-10-CM

## 2019-09-02 MED ORDER — ALLOPURINOL 100 MG PO TABS: 200.0000 mg | ORAL_TABLET | Freq: Every day | ORAL | 2 refills | Status: DC

## 2019-09-02 NOTE — Progress Notes (Signed)
[facility] #[id number] Suzanne Hansen, [address] AT [facility] [address] [facility] [address] [address] [facility] [address] [place] [address] Phone: [phone] Fax: [phone]  [facility] - [place], [place] - [address] [place] [address] Phone: [phone] Fax: [phone]      Your procedure is scheduled on Wednesday, December 2nd, 2020.   Report to [facility] Main Entrance "A" at 6:30 A.M., and check in at the Admitting office.   Call this number if you have problems the morning of surgery:  [phone]  Call [phone] if you have any questions prior to your surgery date Monday-Friday 8am-4pm    Remember:  Do not eat or drink after midnight the night before your surgery    Take these medicines the morning of surgery with A SIP OF WATER :  Albuterol (Ventolin) inhaler - bring with you the day of surgery Allopurinol (Zyloprim) Atorvastatin (Lipitor) Budesonide-Formoterol (Symbicort) Bupropion (Wellbutrin) Cyclobenzaprine (Flexeril) Fexofenadine (Allegra) Fluticasone (Flonase) Fluticasone-Salmetrol (Advair Diskus) Gabapentin (Neurontin) Guaifenesin (Mucinex) Levothyroxine(Synthroid) Mehylprednisonolone (Medrol) Omeprazole (Prilosec) Prednisone (Deltasone) Tramadol (Ultram) Valacyclovir (Valtrex)  7 days prior to surgery STOP taking any Aspirin (unless otherwise instructed by your surgeon), Aleve, Naproxen, Ibuprofen, Motrin, Advil, Goody's, BC's, all herbal medications, fish oil, and all vitamins.   The Morning of Surgery  Do not wear jewelry, make-up or nail polish.  Do not wear lotions, powders, or perfumes/colognes, or deodorant  Do not shave 48 hours prior to surgery.  Men may shave face and neck.  Do not bring valuables to the hospital.  [facility] is not responsible for any belongings or valuables.  If you are a smoker, DO NOT Smoke 24 hours prior to surgery  If you wear a CPAP at night please bring your  mask, tubing, and machine the morning of surgery   Remember that you must have someone to transport you home after your surgery, and remain with you for 24 hours if you are discharged the same day.   Please bring cases for contacts, glasses, hearing aids, dentures or bridgework because it cannot be worn into surgery.    Leave your suitcase in the car.  After surgery it may be brought to your room.  For patients admitted to the hospital, discharge time will be determined by your treatment team.  Patients discharged the day of surgery will not be allowed to drive home.    Special instructions:   [place]- Preparing For Surgery  Before surgery, you can play an important role. Because skin is not sterile, your skin needs to be as free of germs as possible. You can reduce the number of germs on your skin by washing with CHG (chlorahexidine gluconate) Soap before surgery.  CHG is an antiseptic cleaner which kills germs and bonds with the skin to continue killing germs even after washing.    Oral Hygiene is also important to reduce your risk of infection.  Remember - BRUSH YOUR TEETH THE MORNING OF SURGERY WITH YOUR REGULAR TOOTHPASTE  Please do not use if you have an allergy to CHG or antibacterial soaps. If your skin becomes reddened/irritated stop using the CHG.  Do not shave (including legs and underarms) for at least 48 hours prior to first CHG shower. It is OK to shave your face.  Please follow these instructions carefully.   1. Shower the NIGHT BEFORE SURGERY and the MORNING OF SURGERY with CHG Soap.   2. If you chose to wash  your hair, wash your hair first as usual with your normal shampoo.  3. After you shampoo, rinse your hair and body thoroughly to remove the shampoo.  4. Use CHG as you would any other liquid soap. You can apply CHG directly to the skin and wash gently with a scrungie or a clean washcloth.   5. Apply the CHG Soap to your body ONLY FROM THE NECK DOWN.  Do not  use on open wounds or open sores. Avoid contact with your eyes, ears, mouth and genitals (private parts). Wash Face and genitals (private parts)  with your normal soap.   6. Wash thoroughly, paying special attention to the area where your surgery will be performed.  7. Thoroughly rinse your body with warm water from the neck down.  8. DO NOT shower/wash with your normal soap after using and rinsing off the CHG Soap.  9. Pat yourself dry with a CLEAN TOWEL.  10. Wear CLEAN PAJAMAS to bed the night before surgery, wear comfortable clothes the morning of surgery  11. Place CLEAN SHEETS on your bed the night of your first shower and DO NOT SLEEP WITH PETS.    Day of Surgery:  Please shower the morning of surgery with the CHG soap Do not apply any deodorants/lotions. Please wear clean clothes to the hospital/surgery center.   Remember to brush your teeth WITH YOUR REGULAR TOOTHPASTE.   Please read over the following fact sheets that you were given.

## 2019-09-02 NOTE — Telephone Encounter (Signed)
Medication Refill - Medication:allopurinol (ZYLOPRIM) 100 MG tablet [id number]   Preferred Pharmacy (with phone number or street name): [facility] [id number] Starling Manns, [address] AT [facility] [address] [place]  [place] [address]  Phone: [phone] Fax: [phone]     Agent: Please be advised that RX refills may take up to 3 business days. We ask that you follow-up with your pharmacy.

## 2019-09-03 ENCOUNTER — Encounter (HOSPITAL_COMMUNITY): Payer: Self-pay

## 2019-09-03 ENCOUNTER — Encounter (HOSPITAL_COMMUNITY)
Admission: RE | Admit: 2019-09-03 | Discharge: 2019-09-03 | Disposition: A | Discharge status: Home / Self Care | Payer: Managed Care, Other (non HMO) | Source: Ambulatory Visit | Attending: Neurosurgery | Admitting: Neurosurgery

## 2019-09-03 DIAGNOSIS — E1122 Type 2 diabetes mellitus with diabetic chronic kidney disease: Secondary | ICD-10-CM | POA: Diagnosis not present

## 2019-09-03 DIAGNOSIS — J45909 Unspecified asthma, uncomplicated: Secondary | ICD-10-CM | POA: Diagnosis not present

## 2019-09-03 DIAGNOSIS — Z87891 Personal history of nicotine dependence: Secondary | ICD-10-CM | POA: Insufficient documentation

## 2019-09-03 DIAGNOSIS — I509 Heart failure, unspecified: Secondary | ICD-10-CM | POA: Insufficient documentation

## 2019-09-03 DIAGNOSIS — E559 Vitamin D deficiency, unspecified: Secondary | ICD-10-CM | POA: Insufficient documentation

## 2019-09-03 DIAGNOSIS — E0590 Thyrotoxicosis, unspecified without thyrotoxic crisis or storm: Secondary | ICD-10-CM | POA: Diagnosis not present

## 2019-09-03 DIAGNOSIS — G4733 Obstructive sleep apnea (adult) (pediatric): Secondary | ICD-10-CM | POA: Insufficient documentation

## 2019-09-03 DIAGNOSIS — Z79890 Hormone replacement therapy (postmenopausal): Secondary | ICD-10-CM | POA: Diagnosis not present

## 2019-09-03 DIAGNOSIS — Z7982 Long term (current) use of aspirin: Secondary | ICD-10-CM | POA: Insufficient documentation

## 2019-09-03 DIAGNOSIS — F419 Anxiety disorder, unspecified: Secondary | ICD-10-CM | POA: Diagnosis not present

## 2019-09-03 DIAGNOSIS — Z6841 Body Mass Index (BMI) 40.0 and over, adult: Secondary | ICD-10-CM | POA: Diagnosis not present

## 2019-09-03 DIAGNOSIS — Z01812 Encounter for preprocedural laboratory examination: Secondary | ICD-10-CM | POA: Insufficient documentation

## 2019-09-03 DIAGNOSIS — N183 Chronic kidney disease, stage 3 unspecified: Secondary | ICD-10-CM | POA: Insufficient documentation

## 2019-09-03 DIAGNOSIS — Z79899 Other long term (current) drug therapy: Secondary | ICD-10-CM | POA: Insufficient documentation

## 2019-09-03 DIAGNOSIS — K219 Gastro-esophageal reflux disease without esophagitis: Secondary | ICD-10-CM | POA: Insufficient documentation

## 2019-09-03 DIAGNOSIS — Z791 Long term (current) use of non-steroidal anti-inflammatories (NSAID): Secondary | ICD-10-CM | POA: Diagnosis not present

## 2019-09-03 DIAGNOSIS — M47816 Spondylosis without myelopathy or radiculopathy, lumbar region: Secondary | ICD-10-CM | POA: Diagnosis not present

## 2019-09-03 DIAGNOSIS — I428 Other cardiomyopathies: Secondary | ICD-10-CM | POA: Diagnosis not present

## 2019-09-03 DIAGNOSIS — E785 Hyperlipidemia, unspecified: Secondary | ICD-10-CM | POA: Diagnosis not present

## 2019-09-03 HISTORY — DX: Dyspnea, unspecified: R06.00

## 2019-09-03 HISTORY — DX: Type 2 diabetes mellitus without complications: E11.9

## 2019-09-03 HISTORY — DX: Gastro-esophageal reflux disease without esophagitis: K21.9

## 2019-09-03 HISTORY — DX: Prediabetes: R73.03

## 2019-09-03 LAB — CBC
HCT: 41.4 % (ref 36.0–46.0)
Hemoglobin: 13.1 g/dL (ref 12.0–15.0)
MCH: 30 pg (ref 26.0–34.0)
MCHC: 31.6 g/dL (ref 30.0–36.0)
MCV: 94.7 fL (ref 80.0–100.0)
Platelets: 289 10*3/uL (ref 150–400)
RBC: 4.37 MIL/uL (ref 3.87–5.11)
RDW: 14.6 % (ref 11.5–15.5)
WBC: 9.6 10*3/uL (ref 4.0–10.5)
nRBC: 0 % (ref 0.0–0.2)

## 2019-09-03 LAB — TYPE AND SCREEN
ABO/RH(D): B POS
Antibody Screen: NEGATIVE

## 2019-09-03 LAB — BASIC METABOLIC PANEL
Anion gap: 9 (ref 5–15)
BUN: 21 mg/dL (ref 8–23)
CO2: 26 mmol/L (ref 22–32)
Calcium: 9.6 mg/dL (ref 8.9–10.3)
Chloride: 103 mmol/L (ref 98–111)
Creatinine, Ser: 1.47 mg/dL — ABNORMAL HIGH (ref 0.44–1.00)
GFR calc Af Amer: 44 mL/min — ABNORMAL LOW (ref >60)
GFR calc non Af Amer: 38 mL/min — ABNORMAL LOW (ref >60)
Glucose, Bld: 87 mg/dL (ref 70–99)
Potassium: 4.4 mmol/L (ref 3.5–5.1)
Sodium: 138 mmol/L (ref 135–145)

## 2019-09-03 LAB — ABO/RH: ABO/RH(D): B POS

## 2019-09-03 LAB — SURGICAL PCR SCREEN
MRSA, PCR: NEGATIVE
Staphylococcus aureus: NEGATIVE

## 2019-09-03 LAB — GLUCOSE, CAPILLARY
Glucose-Capillary: 65 mg/dL — ABNORMAL LOW (ref 70–99)
Glucose-Capillary: 105 mg/dL — ABNORMAL HIGH (ref 70–99)

## 2019-09-03 NOTE — Progress Notes (Signed)
Your procedure is scheduled on Wednesday, December 2nd, 2020.   Report to [facility] Main Entrance "A" at 6:30 A.M., and check in at the Admitting office.            Your surgery or procedure is scheduled for 8:30 AM   Call this number if you have problems the morning of surgery:  [phone]  Call [phone] if you have any questions prior to your surgery date Monday-Friday 8am-4pm    Remember:  Do not eat or drink after midnight the night before your surgery    Take these medicines the morning of surgery with A SIP OF WATER :  Allopurinol (Zyloprim) Atorvastatin (Lipitor) Budesonide-Formoterol (Symbicort) Bupropion (Wellbutrin) Gabapentin (Neurontin) Levothyroxine(Synthroid) Omeprazole (Prilosec)   If Needed: Albuterol (Ventolin) inhaler - bring with you the day of surgery Cyclobenzaprine (Flexeril) Fexofenadine (Allegra) Fluticasone (Flonase) Guaifenesin (Mucinex) Tramadol (Ultram) Valacyclovir (Valtrex)  7 days prior to surgery STOP taking any Aspirin (unless otherwise instructed by your surgeon), Aleve, Naproxen, Ibuprofen, Motrin, Advil, Goody's, BC's, all herbal medications, fish oil, and all vitamins.   Special instructions:   [place]- Preparing For Surgery  Before surgery, you can play an important role. Because skin is not sterile, your skin needs to be as free of germs as possible. You can reduce the number of germs on your skin by washing with CHG (chlorahexidine gluconate) Soap before surgery.  CHG is an antiseptic cleaner which kills germs and bonds with the skin to continue killing germs even after washing.    Oral Hygiene is also important to reduce your risk of infection.  Remember - BRUSH YOUR TEETH THE MORNING OF SURGERY WITH YOUR REGULAR TOOTHPASTE  Please do not use if you have an allergy to CHG or antibacterial soaps. If your skin becomes reddened/irritated stop using the CHG.  Do not shave (including legs and underarms) for at least  48 hours prior to first CHG shower. It is OK to shave your face.  Please follow these instructions carefully.   1. Shower the NIGHT BEFORE SURGERY and the MORNING OF SURGERY with CHG Soap.   2. If you chose to wash your hair, wash your hair first as usual with your normal shampoo.  3. After you shampoo, rinse your hair and body thoroughly to remove the shampoo.  4. Use CHG as you would any other liquid soap. You can apply CHG directly to the skin and wash gently with a scrungie or a clean washcloth.   5. Apply the CHG Soap to your body ONLY FROM THE NECK DOWN.  Do not use on open wounds or open sores. Avoid contact with your eyes, ears, mouth and genitals (private parts). Wash Face and genitals (private parts)  with your normal soap.   6. Wash thoroughly, paying special attention to the area where your surgery will be performed.  7. Thoroughly rinse your body with warm water from the neck down.  8. DO NOT shower/wash with your normal soap after using and rinsing off the CHG Soap.  9. Pat yourself dry with a CLEAN TOWEL.  10. Wear CLEAN PAJAMAS to bed the night before surgery, wear comfortable clothes the morning of surgery  11. Place CLEAN SHEETS on your bed the night of your first shower and DO NOT SLEEP WITH PETS.    Day of Surgery: Shower as instructed above. Please shower the morning of surgery with the CHG soap Do not wear lotions, powders, or perfumes/colognes, or deodorant Please wear clean clothes  to the hospital/surgery center.   Remember to brush your teeth WITH YOUR REGULAR TOOTHPASTE.   Do not wear jewelry, make-up or nail polish.  Do not shave 48 hours prior to surgery.    Do not bring valuables to the hospital.  [facility] is not responsible for any belongings or valuables.  If you wear a CPAP at night please bring your mask, tubing, and machine the morning of surgery    Please bring cases for contacts, glasses, hearing aids, dentures or bridgework because it  cannot be worn into surgery.    For patients admitted to the hospital, discharge time will be determined by your treatment team.  Please read over the following fact sheets that you were given: Pain Booklet, Patient Instructions for Mupirocin Application, Coughing and Deep Breathing. Surgical Site Infections.

## 2019-09-03 NOTE — Progress Notes (Addendum)
PCP - Dr. Ina Kick  Cardiologist - Dr Azzie Glatter  Neurologist : Dr Hubbard Robinson Stem  Chest x-ray - no  EKG - 08/29/2019- [facility] [place]- tracing on the chart  Stress Test - 10/07/2018 [facility]  ECHO - 05/17/2017  Cardiac Cath - 05/02/2018   Sleep Study - yes  CPAP - yes  LABS-CBC, BMP, T/S, PCR  ASA-no  ERAS-no  HA1C-5.7- 07/18/2019  Fasting Blood Sugar - doesn't test it Checks Blood Sugar _0____ times a day  Suzanne Hansen just moved and has not found the CBG machine.  I gave her How to manage your diabetes instructions to use if she finds the CBG machine.  AT arrival to PAT patient's CBG was 65, she drank 4 ounces of apple.  The CBG was retested 15 minutes later, the CBG was 105  Anesthesia- Pt denies having chest pain, sob, or fever at this time. All instructions explained to the pt, with a verbal understanding of the material. Pt agrees to go over the instructions while at home for a better understanding. Pt also instructed to self quarantine after being tested for COVID-19. The opportunity to ask questions was provided.

## 2019-09-03 NOTE — Progress Notes (Signed)
   How to Manage Your Diabetes Before and After Surgery  Why is it important to control my blood sugar before and after surgery? . Improving blood sugar levels before and after surgery helps healing and can limit problems. . A way of improving blood sugar control is eating a healthy diet by: o  Eating less sugar and carbohydrates o  Increasing activity/exercise o  Talking with your doctor about reaching your blood sugar goals . High blood sugars (greater than 180 mg/dL) can raise your risk of infections and slow your recovery, so you will need to focus on controlling your diabetes during the weeks before surgery. . Make sure that the doctor who takes care of your diabetes knows about your planned surgery including the date and location.  How do I manage my blood sugar before surgery? . Check your blood sugar at least 4 times a day, starting 2 days before surgery, to make sure that the level is not too high or low. o Check your blood sugar the morning of your surgery when you wake up and every 2 hours until you get to the Short Stay unit. . If your blood sugar is less than 70 mg/dL, you will need to treat for low blood sugar: o Do not take insulin. o Treat a low blood sugar (less than 70 mg/dL) with  cup of clear juice (cranberry or apple), 4 glucose tablets, OR glucose gel. Recheck blood sugar in 15 minutes after treatment (to make sure it is greater than 70 mg/dL). If your blood sugar is not greater than 70 mg/dL on recheck, call [phone] o  for further instructions. . Report your blood sugar to the short stay nurse when you get to Short Stay.  . If you are admitted to the hospital after surgery: o Your blood sugar will be checked by the staff and you will probably be given insulin after surgery (instead of oral diabetes medicines) to make sure you have good blood sugar levels. o The goal for blood sugar control after surgery is 80-180 mg/dL.           

## 2019-09-04 NOTE — Progress Notes (Signed)
Anesthesia Chart Review:  Case: [id number] Date/Time: 09/11/19 0815   Procedure: PLIF - L4-L5 (N/A Back)   Anesthesia type: General   Pre-op diagnosis: Spondylosis   Location: [place] OR ROOM 20 / [place] [facility]   Surgeon: Kary Kos, MD      DISCUSSION: Patient is a 63 year old female scheduled for the above procedure.  History includes former smoker (quit 1989), non-ischemic cardiomyopathy (EF 48%, normal stress test 09/2013, EF 40-45% 08/2015, EF 60-65% 05/2018), CHF, DM2, HLD, GERD, asthma, CKD stage III, hyperthyroidism->acquired hypothyroidism, OSA (CPAP), dyspnea, chest pain (history of, negative stress test 2014, 2018). BMI is consistent with morbid obesity.  Per preoperative cardiology evaluation by Dr. Kirtland Bouchard on 08/29/19, "There is no absolute cardiovascular contraindication to surgery. Her perioperative risk of a cardiac event (death, myocardial infarction or cardiac arrest at 30 days post surgery) based on the revised cardiac risk index is 4.9 to 7.4%."  Presurgical COVID-19 test is scheduled for 09/09/19.  If negative and otherwise no acute changes and I would anticipate that she could proceed as planned.   VS: BP 125/83   Pulse 75   Temp (!) 36.2 C (Oral)   Resp 20   Ht 5\' 4"  (1.626 m)   Wt 107.6 kg   SpO2 100%   BMI 40.70 kg/m    PROVIDERS: Biagio Borg, MD is PCP  Corky Mull, DO is cardiologist ([place]) Stem, Hortencia Pilar, MD is nephrologist ([place]). Last visit 06/19/19. Jearld Lesch, DO is Weight Management provider   LABS: Labs reviewed: Acceptable for surgery. Renal function appears stable. A1c 5.7% on 07/18/19. (all labs ordered are listed, but only abnormal results are displayed)  Labs Reviewed  BASIC METABOLIC PANEL - Abnormal; Notable for the following components:      Result Value   Creatinine, Ser 1.47 (*)    GFR calc non Af Amer 38 (*)    GFR calc Af Amer 44 (*)    All other components within normal limits  GLUCOSE, CAPILLARY -  Abnormal; Notable for the following components:   Glucose-Capillary 65 (*)    All other components within normal limits  GLUCOSE, CAPILLARY - Abnormal; Notable for the following components:   Glucose-Capillary 105 (*)    All other components within normal limits  SURGICAL PCR SCREEN  CBC  TYPE AND SCREEN  ABO/RH   Lab Results  Component Value Date   CREATININE 1.47 (H) 09/03/2019   CREATININE 1.41 (H) 07/18/2019   CREATININE 1.44 (H) 09/28/2018     EKG: 08/29/19 (Dr. Kirtland Bouchard):  Sinus rhythm with occasional premature ventricular complexes Poor progress of the R waves across the precordium Low voltage QRS, consider pulmonary disease or obesity When compared to ECG of May 17, 2018, PVCs are now present, nonspecific T wave abnormality has replaced inverted T waves in inferior leads Confirmed by Howell Rucks, MD   CV: CUS Metabolic Bike Exercise Study 10/07/18 [facility]): Overall Impression: Negative study for ischemia by EKG criteriaat heart rate level achieved. See Metaboic Bike Results.  Test was technically adequate but appeared submaximal based on patients reports, v-slope and heart rate, overall impression  is mild to moderate cardiac limitation. No indication of pulmonary limitation to exercise Compared to prior study, the result is unchanged, prior value 9.2 (59% predicted), or 18.4 corrected for LBM. Confirmed by Rogelia Mire [phone]) on 10/07/2018 8:38:28 PM    Echo 05/17/18 [facility] CE): SUMMARY The left ventricular size is normal. Left ventricular systolic function is normal. LV ejection  fraction = 60-65%. Left ventricular filling pattern is prolonged relaxation. The right ventricle is normal in size and function. There is mild tricuspid regurgitation. No significant stenosis or regurgitation seen Estimated right ventricular systolic pressure is 27 mmHg. The inferior vena cava was not visualized during the exam. There is no pericardial effusion. Unable to  compare with prior due to differences in image quality   RHC 05/07/18 [facility] CE): Findings Right Atrium is normal.  Right Ventricle is normal.  Pulmonary Artery is normal.  Pulmonary capillary wedge pressure is normal.  There is no Pulmonary Hypertension.  Mixed Venous Saturation is normal .  Cardiac Output is normal by thermodilution. Cardiac Output is normal by Fick. There is no Systemic Hypertension.   Procedural Statistics - Pressures at rest   RA 5/5/3  RV 30/2/3  PA 28/12/17  PCW 10/6/5  - Pressures with exercise   RA 6/6/4  RV 20/2 (RVEDP 6)  PA 20/10/13  PCW 8/9/6   Saturations   PA Saturation O2-Sat 71% at rest  PA Saturation O2-Sat 58% at peak exercise   Derived Parameters   PVR 2.05 [place] by TD (2.1 by Fick)  SVR 1371dyn/sec/cm-5  PVR after exercise 1.7 Woods by TD  SVR after exercise 1,003 dynes (2,111 by fick)   - Resting Estimated Blood Loss: < 10 cc Mean Arterial Pressure (MAP): 99 Cardiac Output (CO) Fick: 5.60 Cardiac Index (CI) Fick: 2.63 Cardiac Output (CO) Thermodilution: 5.85 Cardiac Index (CI) Thermodilution: 2.75 Transpulmonary Pressure Gradient (TPG) in mmHg: 12 at rest (13 with exercise) Blood pressure 129/80 4/90 9 mmHg Heart rate 88 bpm  - Exercise Cardiac Output (CO) Fick: 3.6 (based on SVO2 drop to 58%; however there was no calculated oxygen consumption) Cardiac Index (CI) Fick: 1.7 (based on SVO2 drop to 58%) Cardiac Output (CO) Thermodilution: 7.57 Cardiac Index (CI) Thermodilution: 3.56 Blood pressure decreased from 127/79, heart rate 85 bpm to 92/72 mmHG, heart rate 107 bpm. Blood pressure subsequently decreased to 76/47 mmHg, heart rate 114 bpm. The patient developed chest pain. The EKG tracings were inadequate to determine ST segment changes. Repeat hemodynamics were completed and the study was aborted. O2 sat decreased from 95% to 91%, and then 73% while blood pressure was 76/47 mmHg. However the pulse oximetry tracing  was not accurately picking up a pulse.    ZioPatch 1-14 days 11/26/17 [facility]): According to 12/21/17 note by Dr. Kirtland Bouchard, " Ziopatch in January 2019, showed sinus rhythm and isolated PVCs".   Nuclear stress test 06/27/17 [facility] CE): CONCLUSION: No inducible ischemia. Normal left ventricular function. EF > 70%.   Past Medical History:  Diagnosis Date  . ALLERGIC RHINITIS 05/09/2008  . Allergy   . ANXIETY 05/23/2007  . ARM PAIN, LEFT 10/23/2009  . Arthritis   . Asthma   . ASTHMA 05/23/2007  . Back pain   . Cardiomyopathy ([place]) 09/08/2015  . Chest pain   . CHF (congestive heart failure) ([place])   . Constipation   . CONTACT DERMATITIS 06/09/2009  . Diabetes mellitus without complication ([place])    type II  . Dyspnea    allergies-dust, cig smoke, rag weeds  . ENDOMETRIOSIS NOS 05/23/2007  . FACIAL PAIN 06/02/2010  . Food allergy   . FREQUENCY, URINARY 05/17/2010  . GERD (gastroesophageal reflux disease)   . GLUCOSE INTOLERANCE 08/28/2009  . HERPES SIMPLEX, UNCOMPLICATED [id number]  . Hyperlipidemia 08/28/2015  . HYPERTHYROIDISM 05/23/2007  . Hypoactive thyroid   . HYPOTHYROIDISM 05/09/2008  . Impaired glucose tolerance 01/28/2011  . INSOMNIA,  HX OF 05/23/2007  . Joint pain   . LATERAL EPICONDYLITIS, RIGHT 03/10/2009  . LIPOMA 10/22/2010  . NECK PAIN 11/10/2010  . OBESITY 05/23/2007  . Osteopenia 09/28/2018  . Plantar fasciitis    Both feet  . Pre-diabetes   . SHOULDER PAIN, LEFT 05/17/2010  . SINUSITIS- ACUTE-NOS 11/03/2008  . SKIN LESION 11/10/2010  . Sleep apnea   . Stage III chronic kidney disease    Stage III  . Unspecified Chest Pain 07/25/2008  . UNSPECIFIED URTICARIA 06/04/2009  . URI 08/28/2009  . UTI 04/29/2008  . VITAMIN D DEFICIENCY 08/28/2009  . Wheezing 07/12/2010    Past Surgical History:  Procedure Laterality Date  . COLONOSCOPY    . ENDOMETRIAL ABLATION    . LAMINECTOMY  1996  . LAPAROTOMY     exploratory  . lower back surgery  07/06/2017   cyst  . POLYPECTOMY    .  s/p endometrial ablation  12/06  . TUBAL LIGATION    . UTERINE FIBROID SURGERY    . WISDOM TOOTH EXTRACTION      MEDICATIONS: . Cyanocobalamin (VITAMIN B-12) 1000 MCG SUBL  . acetaminophen (TYLENOL) 650 MG CR tablet  . albuterol (VENTOLIN HFA) 108 (90 Base) MCG/ACT inhaler  . allopurinol (ZYLOPRIM) 100 MG tablet  . aspirin 81 MG EC tablet  . atorvastatin (LIPITOR) 20 MG tablet  . budesonide-formoterol (SYMBICORT) 160-4.5 MCG/ACT inhaler  . buPROPion (WELLBUTRIN SR) 150 MG 12 hr tablet  . Cholecalciferol (VITAMIN D) 50 MCG (2000 UT) CAPS  . cyclobenzaprine (FLEXERIL) 10 MG tablet  . diclofenac Sodium (VOLTAREN) 1 % GEL  . fexofenadine (ALLEGRA) 180 MG tablet  . fluticasone (FLONASE) 50 MCG/ACT nasal spray  . gabapentin (NEURONTIN) 300 MG capsule  . guaiFENesin (MUCINEX) 600 MG 12 hr tablet  . ibuprofen (ADVIL,MOTRIN) 600 MG tablet  . KLOR-CON M20 20 MEQ tablet  . levothyroxine (SYNTHROID) 112 MCG tablet  . losartan (COZAAR) 25 MG tablet  . Menthol-Methyl Salicylate (MUSCLE RUB) 10-15 % CREA  . Multiple Vitamin (MULTIVITAMIN) capsule  . omeprazole (PRILOSEC) 20 MG capsule  . torsemide (DEMADEX) 20 MG tablet  . traMADol (ULTRAM) 50 MG tablet  . valACYclovir (VALTREX) 1000 MG tablet   No current facility-administered medications for this encounter.   No currently taking ASA at the time of PAT RN visit by notes.    Myra Gianotti, PA-C Surgical Short Stay/Anesthesiology [facility] Phone [phone] [facility] Phone [phone] 09/04/2019 4:11 PM

## 2019-09-04 NOTE — Anesthesia Preprocedure Evaluation (Addendum)
Anesthesia Evaluation  Patient identified by MRN, date of birth, ID band Patient awake    Reviewed: Allergy & Precautions, NPO status , Patient's Chart, lab work & pertinent test results  Airway Mallampati: IV  TM Distance: >3 FB Neck ROM: Full    Dental no notable dental hx.    Pulmonary shortness of breath and with exertion, asthma (Controlled with inhalers ) , sleep apnea and Continuous Positive Airway Pressure Ventilation , former smoker,    Pulmonary exam normal breath sounds clear to auscultation       Cardiovascular +CHF  Normal cardiovascular exam Rhythm:Regular Rate:Normal  Imaging in PA-C note   Neuro/Psych  Headaches, Anxiety  Neuromuscular disease    GI/Hepatic Neg liver ROS, GERD  Medicated and Controlled,  Endo/Other  diabetesHypothyroidism Morbid obesity  Renal/GU CRFRenal disease     Musculoskeletal negative musculoskeletal ROS (+)   Abdominal   Peds  Hematology HLD   Anesthesia Other Findings Spondylosis  Reproductive/Obstetrics                          Anesthesia Physical Anesthesia Plan  ASA: III  Anesthesia Plan: General   Post-op Pain Management:    Induction: Intravenous  PONV Risk Score and Plan: 3 and Ondansetron, Dexamethasone, Midazolam and Treatment may vary due to age or medical condition  Airway Management Planned: Oral ETT and Video Laryngoscope Planned  Additional Equipment:   Intra-op Plan:   Post-operative Plan: Extubation in OR  Informed Consent:     Dental advisory given  Plan Discussed with: CRNA  Anesthesia Plan Comments: (Reviewed PAT note written 09/04/2019 by Myra Gianotti, PA-C. )      Anesthesia Quick Evaluation

## 2019-09-08 ENCOUNTER — Other Ambulatory Visit (INDEPENDENT_AMBULATORY_CARE_PROVIDER_SITE_OTHER): Payer: Self-pay | Admitting: Bariatrics

## 2019-09-08 DIAGNOSIS — F3289 Other specified depressive episodes: Secondary | ICD-10-CM

## 2019-09-09 ENCOUNTER — Other Ambulatory Visit (HOSPITAL_COMMUNITY)
Admission: RE | Admit: 2019-09-09 | Discharge: 2019-09-09 | Disposition: A | Discharge status: Home / Self Care | Payer: Managed Care, Other (non HMO) | Source: Ambulatory Visit | Attending: Neurosurgery | Admitting: Neurosurgery

## 2019-09-09 LAB — SARS CORONAVIRUS 2 (TAT 6-24 HRS): SARS Coronavirus 2: NEGATIVE

## 2019-09-11 ENCOUNTER — Encounter (HOSPITAL_COMMUNITY)
Admission: RE | Disposition: A | Discharge status: Home / Self Care | Payer: Self-pay | Source: Home / Self Care | Attending: Neurosurgery

## 2019-09-11 ENCOUNTER — Inpatient Hospital Stay (HOSPITAL_COMMUNITY)
Admission: RE | Admit: 2019-09-11 | Discharge: 2019-09-13 | DRG: 454 | Disposition: A | Discharge status: Home / Self Care | Payer: Managed Care, Other (non HMO) | Attending: Neurosurgery | Admitting: Neurosurgery

## 2019-09-11 ENCOUNTER — Inpatient Hospital Stay (HOSPITAL_COMMUNITY): Payer: Managed Care, Other (non HMO) | Admitting: Vascular Surgery

## 2019-09-11 ENCOUNTER — Inpatient Hospital Stay (HOSPITAL_COMMUNITY): Payer: Managed Care, Other (non HMO)

## 2019-09-11 ENCOUNTER — Encounter (HOSPITAL_COMMUNITY): Payer: Self-pay | Admitting: Certified Registered Nurse Anesthetist

## 2019-09-11 ENCOUNTER — Other Ambulatory Visit: Payer: Self-pay

## 2019-09-11 ENCOUNTER — Inpatient Hospital Stay (HOSPITAL_COMMUNITY): Payer: Managed Care, Other (non HMO) | Admitting: Anesthesiology

## 2019-09-11 DIAGNOSIS — N183 Chronic kidney disease, stage 3 unspecified: Secondary | ICD-10-CM | POA: Diagnosis present

## 2019-09-11 DIAGNOSIS — M4726 Other spondylosis with radiculopathy, lumbar region: Secondary | ICD-10-CM | POA: Diagnosis present

## 2019-09-11 DIAGNOSIS — Z91040 Latex allergy status: Secondary | ICD-10-CM | POA: Diagnosis not present

## 2019-09-11 DIAGNOSIS — E1122 Type 2 diabetes mellitus with diabetic chronic kidney disease: Secondary | ICD-10-CM | POA: Diagnosis present

## 2019-09-11 DIAGNOSIS — Z887 Allergy status to serum and vaccine status: Secondary | ICD-10-CM

## 2019-09-11 DIAGNOSIS — Z8249 Family history of ischemic heart disease and other diseases of the circulatory system: Secondary | ICD-10-CM

## 2019-09-11 DIAGNOSIS — K219 Gastro-esophageal reflux disease without esophagitis: Secondary | ICD-10-CM | POA: Diagnosis present

## 2019-09-11 DIAGNOSIS — Z91013 Allergy to seafood: Secondary | ICD-10-CM | POA: Diagnosis not present

## 2019-09-11 DIAGNOSIS — E785 Hyperlipidemia, unspecified: Secondary | ICD-10-CM | POA: Diagnosis present

## 2019-09-11 DIAGNOSIS — I130 Hypertensive heart and chronic kidney disease with heart failure and stage 1 through stage 4 chronic kidney disease, or unspecified chronic kidney disease: Secondary | ICD-10-CM | POA: Diagnosis present

## 2019-09-11 DIAGNOSIS — I5022 Chronic systolic (congestive) heart failure: Secondary | ICD-10-CM | POA: Diagnosis present

## 2019-09-11 DIAGNOSIS — Z881 Allergy status to other antibiotic agents status: Secondary | ICD-10-CM

## 2019-09-11 DIAGNOSIS — Z87891 Personal history of nicotine dependence: Secondary | ICD-10-CM

## 2019-09-11 DIAGNOSIS — Z888 Allergy status to other drugs, medicaments and biological substances status: Secondary | ICD-10-CM

## 2019-09-11 DIAGNOSIS — M47816 Spondylosis without myelopathy or radiculopathy, lumbar region: Secondary | ICD-10-CM | POA: Diagnosis present

## 2019-09-11 DIAGNOSIS — M532X6 Spinal instabilities, lumbar region: Secondary | ICD-10-CM | POA: Diagnosis present

## 2019-09-11 DIAGNOSIS — Z87892 Personal history of anaphylaxis: Secondary | ICD-10-CM

## 2019-09-11 DIAGNOSIS — Z91048 Other nonmedicinal substance allergy status: Secondary | ICD-10-CM | POA: Diagnosis not present

## 2019-09-11 DIAGNOSIS — Z91010 Allergy to peanuts: Secondary | ICD-10-CM | POA: Diagnosis not present

## 2019-09-11 DIAGNOSIS — Z79899 Other long term (current) drug therapy: Secondary | ICD-10-CM | POA: Diagnosis not present

## 2019-09-11 DIAGNOSIS — M48061 Spinal stenosis, lumbar region without neurogenic claudication: Secondary | ICD-10-CM | POA: Diagnosis present

## 2019-09-11 DIAGNOSIS — Z7982 Long term (current) use of aspirin: Secondary | ICD-10-CM

## 2019-09-11 DIAGNOSIS — Z841 Family history of disorders of kidney and ureter: Secondary | ICD-10-CM

## 2019-09-11 DIAGNOSIS — Z20828 Contact with and (suspected) exposure to other viral communicable diseases: Secondary | ICD-10-CM | POA: Diagnosis present

## 2019-09-11 DIAGNOSIS — Z7951 Long term (current) use of inhaled steroids: Secondary | ICD-10-CM | POA: Diagnosis not present

## 2019-09-11 DIAGNOSIS — Z419 Encounter for procedure for purposes other than remedying health state, unspecified: Secondary | ICD-10-CM

## 2019-09-11 DIAGNOSIS — Z833 Family history of diabetes mellitus: Secondary | ICD-10-CM

## 2019-09-11 DIAGNOSIS — Z91012 Allergy to eggs: Secondary | ICD-10-CM | POA: Diagnosis not present

## 2019-09-11 DIAGNOSIS — E669 Obesity, unspecified: Secondary | ICD-10-CM

## 2019-09-11 LAB — GLUCOSE, CAPILLARY
Glucose-Capillary: 96 mg/dL (ref 70–99)
Glucose-Capillary: 131 mg/dL — ABNORMAL HIGH (ref 70–99)
Glucose-Capillary: 180 mg/dL — ABNORMAL HIGH (ref 70–99)
Glucose-Capillary: 131 mg/dL — ABNORMAL HIGH (ref 70–99)

## 2019-09-11 SURGERY — POSTERIOR LUMBAR FUSION 1 LEVEL
Anesthesia: General | Site: Back

## 2019-09-11 MED ORDER — ROCURONIUM BROMIDE 10 MG/ML (PF) SYRINGE
PREFILLED_SYRINGE | INTRAVENOUS | Status: DC | PRN
  Administered 2019-09-11: 20 mg via INTRAVENOUS
  Administered 2019-09-11: 80 mg via INTRAVENOUS

## 2019-09-11 MED ORDER — ALBUTEROL SULFATE (2.5 MG/3ML) 0.083% IN NEBU: 3.0000 mL | INHALATION_SOLUTION | RESPIRATORY_TRACT | Status: DC | PRN

## 2019-09-11 MED ORDER — FLUTICASONE PROPIONATE 50 MCG/ACT NA SUSP: 2.0000 | Freq: Every day | NASAL | Status: DC | PRN

## 2019-09-11 MED ORDER — PROPOFOL 10 MG/ML IV BOLUS
INTRAVENOUS | Status: AC
  Filled 2019-09-11: qty 20

## 2019-09-11 MED ORDER — THROMBIN 20000 UNITS EX SOLR
CUTANEOUS | Status: DC | PRN
  Administered 2019-09-11: 08:00:00 via TOPICAL

## 2019-09-11 MED ORDER — SODIUM CHLORIDE 0.9 % IV SOLN
250.0000 mL | INTRAVENOUS | Status: DC
  Administered 2019-09-11: 250 mL via INTRAVENOUS

## 2019-09-11 MED ORDER — PROMETHAZINE HCL 25 MG/ML IJ SOLN: 6.2500 mg | INTRAMUSCULAR | Status: DC | PRN

## 2019-09-11 MED ORDER — SUCCINYLCHOLINE CHLORIDE 200 MG/10ML IV SOSY
PREFILLED_SYRINGE | INTRAVENOUS | Status: DC | PRN
  Administered 2019-09-11: 100 mg via INTRAVENOUS

## 2019-09-11 MED ORDER — POTASSIUM CHLORIDE CRYS ER 20 MEQ PO TBCR
40.0000 meq | EXTENDED_RELEASE_TABLET | Freq: Every day | ORAL | Status: DC
  Administered 2019-09-11 – 2019-09-12 (×2): 40 meq via ORAL
  Filled 2019-09-11 (×2): qty 2

## 2019-09-11 MED ORDER — MIDAZOLAM HCL 2 MG/2ML IJ SOLN
INTRAMUSCULAR | Status: DC | PRN
  Administered 2019-09-11: 2 mg via INTRAVENOUS

## 2019-09-11 MED ORDER — CYCLOBENZAPRINE HCL 10 MG PO TABS: 10.0000 mg | ORAL_TABLET | Freq: Every evening | ORAL | Status: DC | PRN

## 2019-09-11 MED ORDER — CEFAZOLIN SODIUM-DEXTROSE 2-4 GM/100ML-% IV SOLN
2.0000 g | Freq: Three times a day (TID) | INTRAVENOUS | Status: DC
  Administered 2019-09-11 – 2019-09-12 (×4): 2 g via INTRAVENOUS
  Filled 2019-09-11 (×4): qty 100

## 2019-09-11 MED ORDER — ONDANSETRON HCL 4 MG/2ML IJ SOLN
INTRAMUSCULAR | Status: AC
  Filled 2019-09-11: qty 2

## 2019-09-11 MED ORDER — MENTHOL 3 MG MT LOZG
1.0000 | LOZENGE | OROMUCOSAL | Status: DC | PRN
  Filled 2019-09-11: qty 9

## 2019-09-11 MED ORDER — CEFAZOLIN SODIUM-DEXTROSE 2-4 GM/100ML-% IV SOLN
INTRAVENOUS | Status: AC
  Filled 2019-09-11: qty 100

## 2019-09-11 MED ORDER — SODIUM CHLORIDE 0.9 % IV SOLN
INTRAVENOUS | Status: DC | PRN
  Administered 2019-09-11: 500 mL

## 2019-09-11 MED ORDER — CEFAZOLIN SODIUM 1 G IJ SOLR
INTRAMUSCULAR | Status: AC
  Filled 2019-09-11: qty 20

## 2019-09-11 MED ORDER — ETOMIDATE 2 MG/ML IV SOLN
INTRAVENOUS | Status: AC
  Filled 2019-09-11: qty 10

## 2019-09-11 MED ORDER — 0.9 % SODIUM CHLORIDE (POUR BTL) OPTIME
TOPICAL | Status: DC | PRN
  Administered 2019-09-11: 1000 mL

## 2019-09-11 MED ORDER — PANTOPRAZOLE SODIUM 40 MG PO TBEC
40.0000 mg | DELAYED_RELEASE_TABLET | Freq: Every day | ORAL | Status: DC
  Administered 2019-09-11 – 2019-09-13 (×3): 40 mg via ORAL
  Filled 2019-09-11 (×3): qty 1

## 2019-09-11 MED ORDER — HEPARIN SODIUM (PORCINE) 1000 UNIT/ML IJ SOLN
INTRAMUSCULAR | Status: AC
  Filled 2019-09-11: qty 1

## 2019-09-11 MED ORDER — BUPROPION HCL ER (SR) 150 MG PO TB12
150.0000 mg | ORAL_TABLET | Freq: Every day | ORAL | Status: DC
  Administered 2019-09-11 – 2019-09-13 (×3): 150 mg via ORAL
  Filled 2019-09-11 (×3): qty 1

## 2019-09-11 MED ORDER — SODIUM CHLORIDE (PF) 0.9 % IJ SOLN
INTRAMUSCULAR | Status: DC | PRN
  Administered 2019-09-11: 5 mL

## 2019-09-11 MED ORDER — ALUM & MAG HYDROXIDE-SIMETH 200-200-20 MG/5ML PO SUSP: 30.0000 mL | Freq: Four times a day (QID) | ORAL | Status: DC | PRN

## 2019-09-11 MED ORDER — SODIUM CHLORIDE 0.9% FLUSH: 3.0000 mL | INTRAVENOUS | Status: DC | PRN

## 2019-09-11 MED ORDER — THROMBIN 5000 UNITS EX SOLR
CUTANEOUS | Status: AC
  Filled 2019-09-11: qty 5000

## 2019-09-11 MED ORDER — CHLORHEXIDINE GLUCONATE CLOTH 2 % EX PADS: 6.0000 | MEDICATED_PAD | Freq: Once | CUTANEOUS | Status: DC

## 2019-09-11 MED ORDER — ACETAMINOPHEN 650 MG RE SUPP: 650.0000 mg | RECTAL | Status: DC | PRN

## 2019-09-11 MED ORDER — LORATADINE 10 MG PO TABS
10.0000 mg | ORAL_TABLET | Freq: Every day | ORAL | Status: DC
  Administered 2019-09-11 – 2019-09-13 (×3): 10 mg via ORAL
  Filled 2019-09-11 (×3): qty 1

## 2019-09-11 MED ORDER — DEXAMETHASONE SODIUM PHOSPHATE 10 MG/ML IJ SOLN
INTRAMUSCULAR | Status: AC
  Filled 2019-09-11: qty 1

## 2019-09-11 MED ORDER — ATORVASTATIN CALCIUM 10 MG PO TABS
20.0000 mg | ORAL_TABLET | Freq: Every day | ORAL | Status: DC
  Administered 2019-09-12 – 2019-09-13 (×2): 20 mg via ORAL
  Filled 2019-09-11 (×2): qty 2

## 2019-09-11 MED ORDER — TORSEMIDE 20 MG PO TABS: 40.0000 mg | ORAL_TABLET | ORAL | Status: DC

## 2019-09-11 MED ORDER — LIDOCAINE 2% (20 MG/ML) 5 ML SYRINGE
INTRAMUSCULAR | Status: DC | PRN
  Administered 2019-09-11: 100 mg via INTRAVENOUS

## 2019-09-11 MED ORDER — ASPIRIN EC 81 MG PO TBEC
81.0000 mg | DELAYED_RELEASE_TABLET | Freq: Every day | ORAL | Status: DC
  Administered 2019-09-11 – 2019-09-13 (×3): 81 mg via ORAL
  Filled 2019-09-11 (×3): qty 1

## 2019-09-11 MED ORDER — POTASSIUM CHLORIDE CRYS ER 20 MEQ PO TBCR
60.0000 meq | EXTENDED_RELEASE_TABLET | Freq: Every day | ORAL | Status: DC
  Administered 2019-09-12: 60 meq via ORAL
  Filled 2019-09-11: qty 3

## 2019-09-11 MED ORDER — ACETAMINOPHEN 500 MG PO TABS
1000.0000 mg | ORAL_TABLET | Freq: Once | ORAL | Status: AC
  Administered 2019-09-11: 1000 mg via ORAL
  Filled 2019-09-11: qty 2

## 2019-09-11 MED ORDER — PROPOFOL 10 MG/ML IV BOLUS
INTRAVENOUS | Status: DC | PRN
  Administered 2019-09-11: 150 mg via INTRAVENOUS

## 2019-09-11 MED ORDER — BUPIVACAINE HCL (PF) 0.25 % IJ SOLN
INTRAMUSCULAR | Status: AC
  Filled 2019-09-11: qty 30

## 2019-09-11 MED ORDER — TRAMADOL HCL 50 MG PO TABS: 50.0000 mg | ORAL_TABLET | Freq: Four times a day (QID) | ORAL | Status: DC | PRN

## 2019-09-11 MED ORDER — OXYCODONE HCL 5 MG/5ML PO SOLN: 5.0000 mg | Freq: Once | ORAL | Status: DC | PRN

## 2019-09-11 MED ORDER — ONDANSETRON HCL 4 MG/2ML IJ SOLN: 4.0000 mg | Freq: Four times a day (QID) | INTRAMUSCULAR | Status: DC | PRN

## 2019-09-11 MED ORDER — PHENYLEPHRINE 40 MCG/ML (10ML) SYRINGE FOR IV PUSH (FOR BLOOD PRESSURE SUPPORT)
PREFILLED_SYRINGE | INTRAVENOUS | Status: AC
  Filled 2019-09-11: qty 20

## 2019-09-11 MED ORDER — TORSEMIDE 20 MG PO TABS
60.0000 mg | ORAL_TABLET | Freq: Every day | ORAL | Status: DC
  Administered 2019-09-12 – 2019-09-13 (×2): 60 mg via ORAL
  Filled 2019-09-11 (×2): qty 3

## 2019-09-11 MED ORDER — MIDAZOLAM HCL 2 MG/2ML IJ SOLN
INTRAMUSCULAR | Status: AC
  Filled 2019-09-11: qty 2

## 2019-09-11 MED ORDER — LOSARTAN POTASSIUM 25 MG PO TABS
12.5000 mg | ORAL_TABLET | Freq: Every day | ORAL | Status: DC
  Administered 2019-09-11 – 2019-09-13 (×3): 12.5 mg via ORAL
  Filled 2019-09-11 (×3): qty 0.5

## 2019-09-11 MED ORDER — HYDROMORPHONE HCL 1 MG/ML IJ SOLN
0.2500 mg | INTRAMUSCULAR | Status: DC | PRN
  Administered 2019-09-11 (×2): 0.5 mg via INTRAVENOUS

## 2019-09-11 MED ORDER — OXYCODONE HCL 5 MG PO TABS: 5.0000 mg | ORAL_TABLET | Freq: Once | ORAL | Status: DC | PRN

## 2019-09-11 MED ORDER — THROMBIN 20000 UNITS EX SOLR
CUTANEOUS | Status: AC
  Filled 2019-09-11: qty 20000

## 2019-09-11 MED ORDER — GABAPENTIN 300 MG PO CAPS
300.0000 mg | ORAL_CAPSULE | Freq: Three times a day (TID) | ORAL | Status: DC
  Administered 2019-09-11 – 2019-09-13 (×6): 300 mg via ORAL
  Filled 2019-09-11 (×6): qty 1

## 2019-09-11 MED ORDER — LEVOTHYROXINE SODIUM 112 MCG PO TABS
112.0000 ug | ORAL_TABLET | Freq: Every day | ORAL | Status: DC
  Administered 2019-09-12 – 2019-09-13 (×2): 112 ug via ORAL
  Filled 2019-09-11 (×2): qty 1

## 2019-09-11 MED ORDER — LACTATED RINGERS IV SOLN
INTRAVENOUS | Status: DC | PRN
  Administered 2019-09-11 (×2): via INTRAVENOUS

## 2019-09-11 MED ORDER — VITAMIN D 25 MCG (1000 UNIT) PO TABS
2000.0000 [IU] | ORAL_TABLET | Freq: Every day | ORAL | Status: DC
  Administered 2019-09-11 – 2019-09-13 (×3): 2000 [IU] via ORAL
  Filled 2019-09-11 (×3): qty 2

## 2019-09-11 MED ORDER — POTASSIUM CHLORIDE CRYS ER 20 MEQ PO TBCR: 40.0000 meq | EXTENDED_RELEASE_TABLET | ORAL | Status: DC

## 2019-09-11 MED ORDER — LIDOCAINE 2% (20 MG/ML) 5 ML SYRINGE
INTRAMUSCULAR | Status: AC
  Filled 2019-09-11: qty 5

## 2019-09-11 MED ORDER — LACTATED RINGERS IV SOLN
INTRAVENOUS | Status: DC | PRN
  Administered 2019-09-11: 08:00:00 via INTRAVENOUS

## 2019-09-11 MED ORDER — ROCURONIUM BROMIDE 10 MG/ML (PF) SYRINGE
PREFILLED_SYRINGE | INTRAVENOUS | Status: AC
  Filled 2019-09-11: qty 10

## 2019-09-11 MED ORDER — LIDOCAINE-EPINEPHRINE 1 %-1:100000 IJ SOLN
INTRAMUSCULAR | Status: DC | PRN
  Administered 2019-09-11: 10 mL

## 2019-09-11 MED ORDER — ADULT MULTIVITAMIN W/MINERALS CH
1.0000 | ORAL_TABLET | Freq: Every day | ORAL | Status: DC
  Administered 2019-09-12 – 2019-09-13 (×2): 1 via ORAL
  Filled 2019-09-11 (×2): qty 1

## 2019-09-11 MED ORDER — DEXAMETHASONE SODIUM PHOSPHATE 10 MG/ML IJ SOLN
10.0000 mg | Freq: Once | INTRAMUSCULAR | Status: AC
  Administered 2019-09-11: 10 mg via INTRAVENOUS
  Filled 2019-09-11: qty 1

## 2019-09-11 MED ORDER — SUCCINYLCHOLINE CHLORIDE 200 MG/10ML IV SOSY
PREFILLED_SYRINGE | INTRAVENOUS | Status: AC
  Filled 2019-09-11: qty 10

## 2019-09-11 MED ORDER — SODIUM CHLORIDE 0.9% FLUSH
3.0000 mL | Freq: Two times a day (BID) | INTRAVENOUS | Status: DC
  Administered 2019-09-11 – 2019-09-12 (×3): 3 mL via INTRAVENOUS

## 2019-09-11 MED ORDER — MOMETASONE FURO-FORMOTEROL FUM 200-5 MCG/ACT IN AERO: 2.0000 | INHALATION_SPRAY | Freq: Two times a day (BID) | RESPIRATORY_TRACT | Status: DC

## 2019-09-11 MED ORDER — SUGAMMADEX SODIUM 200 MG/2ML IV SOLN
INTRAVENOUS | Status: DC | PRN
  Administered 2019-09-11: 200 mg via INTRAVENOUS

## 2019-09-11 MED ORDER — BUPIVACAINE LIPOSOME 1.3 % IJ SUSP
20.0000 mL | Freq: Once | INTRAMUSCULAR | Status: DC
  Filled 2019-09-11: qty 20

## 2019-09-11 MED ORDER — ACETAMINOPHEN 325 MG PO TABS
650.0000 mg | ORAL_TABLET | ORAL | Status: DC | PRN
  Administered 2019-09-12 – 2019-09-13 (×2): 650 mg via ORAL
  Filled 2019-09-11: qty 2

## 2019-09-11 MED ORDER — CEFAZOLIN SODIUM-DEXTROSE 2-4 GM/100ML-% IV SOLN
2.0000 g | INTRAVENOUS | Status: AC
  Administered 2019-09-11: 2 g via INTRAVENOUS

## 2019-09-11 MED ORDER — PHENOL 1.4 % MT LIQD
1.0000 | OROMUCOSAL | Status: DC | PRN
  Administered 2019-09-13: 1 via OROMUCOSAL
  Filled 2019-09-11: qty 177

## 2019-09-11 MED ORDER — MOMETASONE FURO-FORMOTEROL FUM 200-5 MCG/ACT IN AERO
2.0000 | INHALATION_SPRAY | Freq: Two times a day (BID) | RESPIRATORY_TRACT | Status: DC
  Administered 2019-09-11 – 2019-09-12 (×2): 2 via RESPIRATORY_TRACT
  Filled 2019-09-11: qty 8.8

## 2019-09-11 MED ORDER — FENTANYL CITRATE (PF) 250 MCG/5ML IJ SOLN
INTRAMUSCULAR | Status: AC
  Filled 2019-09-11: qty 5

## 2019-09-11 MED ORDER — PHENYLEPHRINE 40 MCG/ML (10ML) SYRINGE FOR IV PUSH (FOR BLOOD PRESSURE SUPPORT)
PREFILLED_SYRINGE | INTRAVENOUS | Status: DC | PRN
  Administered 2019-09-11 (×5): 80 ug via INTRAVENOUS

## 2019-09-11 MED ORDER — CYCLOBENZAPRINE HCL 10 MG PO TABS
10.0000 mg | ORAL_TABLET | Freq: Three times a day (TID) | ORAL | Status: DC | PRN
  Administered 2019-09-12: 10 mg via ORAL
  Filled 2019-09-11: qty 1

## 2019-09-11 MED ORDER — ONDANSETRON HCL 4 MG PO TABS: 4.0000 mg | ORAL_TABLET | Freq: Four times a day (QID) | ORAL | Status: DC | PRN

## 2019-09-11 MED ORDER — HYDROMORPHONE HCL 1 MG/ML IJ SOLN
INTRAMUSCULAR | Status: AC
  Filled 2019-09-11: qty 1

## 2019-09-11 MED ORDER — FENTANYL CITRATE (PF) 250 MCG/5ML IJ SOLN
INTRAMUSCULAR | Status: DC | PRN
  Administered 2019-09-11: 100 ug via INTRAVENOUS
  Administered 2019-09-11 (×2): 50 ug via INTRAVENOUS

## 2019-09-11 MED ORDER — ARTHREX ANGEL - ACD-A SOLUTION (CHARTING ONLY) OPTIME
TOPICAL | Status: DC | PRN
  Administered 2019-09-11: 10 mL via TOPICAL

## 2019-09-11 MED ORDER — HYDROMORPHONE HCL 1 MG/ML IJ SOLN: 0.5000 mg | INTRAMUSCULAR | Status: DC | PRN

## 2019-09-11 MED ORDER — OXYCODONE HCL 5 MG PO TABS
10.0000 mg | ORAL_TABLET | ORAL | Status: DC | PRN
  Administered 2019-09-11 – 2019-09-13 (×7): 10 mg via ORAL
  Filled 2019-09-11 (×7): qty 2

## 2019-09-11 MED ORDER — VALACYCLOVIR HCL 500 MG PO TABS
500.0000 mg | ORAL_TABLET | Freq: Every day | ORAL | Status: DC | PRN
  Administered 2019-09-12: 500 mg via ORAL
  Filled 2019-09-11 (×3): qty 1

## 2019-09-11 MED ORDER — GUAIFENESIN ER 600 MG PO TB12
1200.0000 mg | ORAL_TABLET | Freq: Two times a day (BID) | ORAL | Status: DC | PRN
  Filled 2019-09-11: qty 2

## 2019-09-11 MED ORDER — LIDOCAINE-EPINEPHRINE 1 %-1:100000 IJ SOLN
INTRAMUSCULAR | Status: AC
  Filled 2019-09-11: qty 1

## 2019-09-11 MED ORDER — PANTOPRAZOLE SODIUM 40 MG IV SOLR: 40.0000 mg | Freq: Every day | INTRAVENOUS | Status: DC

## 2019-09-11 MED ORDER — TORSEMIDE 20 MG PO TABS
40.0000 mg | ORAL_TABLET | Freq: Every evening | ORAL | Status: DC
  Administered 2019-09-11 – 2019-09-12 (×2): 40 mg via ORAL
  Filled 2019-09-11 (×3): qty 2

## 2019-09-11 MED ORDER — VITAMIN B-12 1000 MCG PO TABS
1000.0000 ug | ORAL_TABLET | Freq: Every day | ORAL | Status: DC
  Administered 2019-09-11 – 2019-09-13 (×3): 1000 ug via ORAL
  Filled 2019-09-11 (×3): qty 1

## 2019-09-11 MED ORDER — ACETAMINOPHEN ER 650 MG PO TBCR: 650.0000 mg | EXTENDED_RELEASE_TABLET | Freq: Three times a day (TID) | ORAL | Status: DC | PRN

## 2019-09-11 MED ORDER — ALLOPURINOL 100 MG PO TABS
200.0000 mg | ORAL_TABLET | Freq: Every day | ORAL | Status: DC
  Administered 2019-09-12 – 2019-09-13 (×2): 200 mg via ORAL
  Filled 2019-09-11 (×2): qty 2

## 2019-09-11 MED ORDER — HEPARIN SODIUM (PORCINE) 1000 UNIT/ML IJ SOLN
INTRAMUSCULAR | Status: DC | PRN
  Administered 2019-09-11: 5000 [IU]

## 2019-09-11 SURGICAL SUPPLY — 85 items
BAG DECANTER FOR FLEXI CONT (MISCELLANEOUS) ×2 IMPLANT
BASKET BONE COLLECTION (BASKET) ×1 IMPLANT
BENZOIN TINCTURE PRP APPL 2/3 (GAUZE/BANDAGES/DRESSINGS) ×2 IMPLANT
BIT DRILL 5.0/4.0 (BIT) IMPLANT
BLADE CLIPPER SURG (BLADE) IMPLANT
BLADE SURG 11 STRL SS (BLADE) ×2 IMPLANT
BUR CUTTER 7.0 ROUND (BURR) ×2 IMPLANT
BUR MATCHSTICK NEURO 3.0 LAGG (BURR) ×2 IMPLANT
CANISTER SUCT 3000ML PPV (MISCELLANEOUS) ×2 IMPLANT
CAP LOCKING (Cap) ×4 IMPLANT
CAP LOCKING 5.5 CREO (Cap) IMPLANT
CARTRIDGE OIL MAESTRO DRILL (MISCELLANEOUS) ×1 IMPLANT
CATH FOLEY LATEX FREE 16FR (CATHETERS) ×1
CATH FOLEY LF 16FR (CATHETERS) IMPLANT
CONT SPEC 4OZ CLIKSEAL STRL BL (MISCELLANEOUS) ×2 IMPLANT
COVER BACK TABLE 60X90IN (DRAPES) ×2 IMPLANT
COVER WAND RF STERILE (DRAPES) ×2 IMPLANT
DECANTER SPIKE VIAL GLASS SM (MISCELLANEOUS) ×2 IMPLANT
DERMABOND ADVANCED (GAUZE/BANDAGES/DRESSINGS) ×1
DERMABOND ADVANCED .7 DNX12 (GAUZE/BANDAGES/DRESSINGS) ×1 IMPLANT
DIFFUSER DRILL AIR PNEUMATIC (MISCELLANEOUS) ×2 IMPLANT
DRAPE C-ARM 42X72 X-RAY (DRAPES) ×4 IMPLANT
DRAPE C-ARMOR (DRAPES) IMPLANT
DRAPE HALF SHEET 40X57 (DRAPES) IMPLANT
DRAPE LAPAROTOMY 100X72X124 (DRAPES) ×2 IMPLANT
DRAPE SURG 17X23 STRL (DRAPES) ×2 IMPLANT
DRILL 5.0/4.0 (BIT) ×2
DRSG OPSITE 4X5.5 SM (GAUZE/BANDAGES/DRESSINGS) ×2 IMPLANT
DRSG OPSITE POSTOP 4X6 (GAUZE/BANDAGES/DRESSINGS) ×2 IMPLANT
DURAPREP 26ML APPLICATOR (WOUND CARE) ×2 IMPLANT
ELECT BLADE 4.0 EZ CLEAN MEGAD (MISCELLANEOUS) ×2
ELECT REM PT RETURN 9FT ADLT (ELECTROSURGICAL) ×2
ELECTRODE BLDE 4.0 EZ CLN MEGD (MISCELLANEOUS) IMPLANT
ELECTRODE REM PT RTRN 9FT ADLT (ELECTROSURGICAL) ×1 IMPLANT
EVACUATOR 1/8 PVC DRAIN (DRAIN) ×1 IMPLANT
GAUZE 4X4 16PLY RFD (DISPOSABLE) ×1 IMPLANT
GAUZE SPONGE 4X4 12PLY STRL (GAUZE/BANDAGES/DRESSINGS) ×2 IMPLANT
GLOVE BIO SURGEON STRL SZ7 (GLOVE) ×1 IMPLANT
GLOVE BIO SURGEON STRL SZ8 (GLOVE) ×2 IMPLANT
GLOVE BIOGEL PI IND STRL 6.5 (GLOVE) IMPLANT
GLOVE BIOGEL PI IND STRL 7.0 (GLOVE) IMPLANT
GLOVE BIOGEL PI IND STRL 7.5 (GLOVE) IMPLANT
GLOVE BIOGEL PI INDICATOR 6.5 (GLOVE) ×1
GLOVE BIOGEL PI INDICATOR 7.0 (GLOVE)
GLOVE BIOGEL PI INDICATOR 7.5 (GLOVE) ×3
GLOVE EXAM NITRILE XL STR (GLOVE) IMPLANT
GLOVE INDICATOR 8.5 STRL (GLOVE) ×4 IMPLANT
GLOVE SURG SS PI 7.0 STRL IVOR (GLOVE) ×3 IMPLANT
GLOVE SURG SS PI 7.5 STRL IVOR (GLOVE) ×3 IMPLANT
GLOVE SURG SS PI 8.0 STRL IVOR (GLOVE) ×3 IMPLANT
GOWN STRL REUS W/ TWL LRG LVL3 (GOWN DISPOSABLE) IMPLANT
GOWN STRL REUS W/ TWL XL LVL3 (GOWN DISPOSABLE) ×2 IMPLANT
GOWN STRL REUS W/TWL 2XL LVL3 (GOWN DISPOSABLE) IMPLANT
GOWN STRL REUS W/TWL LRG LVL3 (GOWN DISPOSABLE) ×2
GOWN STRL REUS W/TWL XL LVL3 (GOWN DISPOSABLE) ×4
HEMOSTAT POWDER KIT SURGIFOAM (HEMOSTASIS) IMPLANT
KIT BASIN OR (CUSTOM PROCEDURE TRAY) ×2 IMPLANT
KIT BONE MRW ASP ANGEL CPRP (KITS) ×1 IMPLANT
KIT TURNOVER KIT B (KITS) ×2 IMPLANT
MILL MEDIUM DISP (BLADE) ×2 IMPLANT
NDL HYPO 21X1.5 SAFETY (NEEDLE) IMPLANT
NDL HYPO 25X1 1.5 SAFETY (NEEDLE) ×1 IMPLANT
NEEDLE HYPO 21X1.5 SAFETY (NEEDLE) ×2 IMPLANT
NEEDLE HYPO 25X1 1.5 SAFETY (NEEDLE) ×2 IMPLANT
NS IRRIG 1000ML POUR BTL (IV SOLUTION) ×2 IMPLANT
OIL CARTRIDGE MAESTRO DRILL (MISCELLANEOUS) ×2
PACK LAMINECTOMY NEURO (CUSTOM PROCEDURE TRAY) ×2 IMPLANT
PAD ARMBOARD 7.5X6 YLW CONV (MISCELLANEOUS) ×6 IMPLANT
PUTTY DBM ALLOSYNC PURE 10CC (Putty) ×1 IMPLANT
ROD 40MM SPINAL (Rod) ×2 IMPLANT
SHAFT CREO 30MM (Neuro Prosthesis/Implant) ×4 IMPLANT
SPACER SUSTAIN TI 9X22X12 8D (Spacer) ×2 IMPLANT
SPONGE LAP 4X18 RFD (DISPOSABLE) IMPLANT
SPONGE SURGIFOAM ABS GEL 100 (HEMOSTASIS) ×2 IMPLANT
STRIP CLOSURE SKIN 1/2X4 (GAUZE/BANDAGES/DRESSINGS) ×3 IMPLANT
SUT VIC AB 0 CT1 18XCR BRD8 (SUTURE) ×2 IMPLANT
SUT VIC AB 0 CT1 8-18 (SUTURE) ×2
SUT VIC AB 2-0 CT1 18 (SUTURE) ×3 IMPLANT
SUT VIC AB 4-0 PS2 27 (SUTURE) ×2 IMPLANT
SYR 20ML LL LF (SYRINGE) ×1 IMPLANT
TOWEL GREEN STERILE (TOWEL DISPOSABLE) ×2 IMPLANT
TOWEL GREEN STERILE FF (TOWEL DISPOSABLE) ×2 IMPLANT
TRAY FOLEY MTR SLVR 16FR STAT (SET/KITS/TRAYS/PACK) ×1 IMPLANT
TULIP CREP AMP 5.5MM (Orthopedic Implant) ×4 IMPLANT
WATER STERILE IRR 1000ML POUR (IV SOLUTION) ×2 IMPLANT

## 2019-09-11 NOTE — H&P (Signed)
Suzanne Hansen is an 63 y.o. female.   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  Due to patient progression of clinical syndrome imaging findings and failed conservative treatment I recommended decompressive laminectomy and interbody fusion at L4-5.  Have extensively gone over the risks and benefits of that procedure with her as well as perioperative course expectations of outcome and alternatives of surgery and she understands and agrees to proceed forward.  Past Medical History:  Diagnosis Date  . ALLERGIC RHINITIS 05/09/2008  . Allergy   . ANXIETY 05/23/2007  . ARM PAIN, LEFT 10/23/2009  . Arthritis   . Asthma   . ASTHMA 05/23/2007  . Back pain   . Cardiomyopathy ([place]) 09/08/2015  . Chest pain   . CHF (congestive heart failure) ([place])   . Constipation   . CONTACT DERMATITIS 06/09/2009  . Diabetes mellitus without complication ([place])    type II  . Dyspnea    allergies-dust, cig smoke, rag weeds  . ENDOMETRIOSIS NOS 05/23/2007  . FACIAL PAIN 06/02/2010  . Food allergy   . FREQUENCY, URINARY 05/17/2010  . GERD (gastroesophageal reflux disease)   . GLUCOSE INTOLERANCE 08/28/2009  . HERPES SIMPLEX, UNCOMPLICATED [id number]  . Hyperlipidemia 08/28/2015  . HYPERTHYROIDISM 05/23/2007  . Hypoactive thyroid   . HYPOTHYROIDISM 05/09/2008  . Impaired glucose tolerance 01/28/2011  . INSOMNIA, HX OF 05/23/2007  . Joint pain   . LATERAL EPICONDYLITIS, RIGHT 03/10/2009  . LIPOMA 10/22/2010  . NECK PAIN 11/10/2010  . OBESITY 05/23/2007  . Osteopenia 09/28/2018  . Plantar fasciitis    Both feet  . Pre-diabetes   . SHOULDER PAIN, LEFT 05/17/2010  . SINUSITIS- ACUTE-NOS 11/03/2008  . SKIN LESION  11/10/2010  . Sleep apnea   . Stage III chronic kidney disease    Stage III  . Unspecified Chest Pain 07/25/2008  . UNSPECIFIED URTICARIA 06/04/2009  . URI 08/28/2009  . UTI 04/29/2008  . VITAMIN D DEFICIENCY 08/28/2009  . Wheezing 07/12/2010    Past Surgical History:  Procedure Laterality Date  . COLONOSCOPY    . ENDOMETRIAL ABLATION    . LAMINECTOMY  1996  . LAPAROTOMY     exploratory  . lower back surgery  07/06/2017   cyst  . POLYPECTOMY    . s/p endometrial ablation  12/06  . TUBAL LIGATION    . UTERINE FIBROID SURGERY    . WISDOM TOOTH EXTRACTION      Family History  Problem Relation Age of Onset  . Diabetes Cousin   . Hypertension Cousin   . Heart disease Cousin   . Heart disease Mother        Rheumatic heart disease  . Depression Mother   . COPD Father   . Hypertension Father   . Cancer Maternal Grandmother        Breast  . Diabetes Sister   . Kidney disease Sister    Social History:  reports that she quit smoking about 31 years ago. Her smoking use included cigarettes. She has a 6.00 pack-year smoking history. She has never used smokeless tobacco. She reports that she does not drink alcohol or use drugs.  Allergies:  Allergies  Allergen Reactions  . Shellfish Allergy Anaphylaxis  . Clarithromycin Nausea  And Vomiting  . Latex Itching and Other (See Comments)    ITCHING AND COLD SORES AROUND MOUTH WHEN LATEX GLOVES USED BY THE DENTIST/HYGENTIST  . Metronidazole Nausea And Vomiting  . Zostavax [Zoster Vaccine Live]     Arm swelled  . Adhesive [Tape] Dermatitis    Plastic tape   . Eggs Or Egg-Derived Products   . Lisinopril Cough  . Peanut Oil     Pt says it's ok    Medications Prior to Admission  Medication Sig Dispense Refill  . acetaminophen (TYLENOL) 650 MG CR tablet Take 1,300-1,950 mg by mouth every 8 (eight) hours as needed for pain.    [name] albuterol (VENTOLIN HFA) 108 (90 Base) MCG/ACT inhaler Inhale 2 puffs into the lungs every 4 (four) hours  as needed for wheezing or shortness of breath. 18 g 11  . allopurinol (ZYLOPRIM) 100 MG tablet Take 2 tablets (200 mg total) by mouth daily. Take 200 mg by mouth daily. 60 tablet 2  . aspirin 81 MG EC tablet Take 81 mg by mouth daily.      [name] atorvastatin (LIPITOR) 20 MG tablet Take 1 tablet (20 mg total) by mouth daily. 90 tablet 3  . budesonide-formoterol (SYMBICORT) 160-4.5 MCG/ACT inhaler INHALE 2 PUFFS INTO THE LUNGS TWICE DAILY 10.2 g 6  . buPROPion (WELLBUTRIN SR) 150 MG 12 hr tablet TAKE 1 TABLET(150 MG) BY MOUTH DAILY (Patient taking differently: Take 150 mg by mouth daily. ) 30 tablet 0  . Cholecalciferol (VITAMIN D) 50 MCG (2000 UT) CAPS Take 2,000 Units by mouth daily.     . Cyanocobalamin (VITAMIN B-12) 1000 MCG SUBL Place 1 tablet (1,000 mcg total) under the tongue daily.  0  . cyclobenzaprine (FLEXERIL) 10 MG tablet Take 1 tablet (10 mg total) by mouth at bedtime as needed for muscle spasms. 90 tablet 3  . diclofenac Sodium (VOLTAREN) 1 % GEL Apply 1 application topically 3 (three) times daily as needed (pain).    . fexofenadine (ALLEGRA) 180 MG tablet Take 180 mg by mouth daily.      . fluticasone (FLONASE) 50 MCG/ACT nasal spray Place 2 sprays into both nostrils daily. (Patient taking differently: Place 2 sprays into both nostrils daily as needed for allergies. ) 16 g 6  . gabapentin (NEURONTIN) 300 MG capsule TAKE 1 CAPSULE BY MOUTH THREE TIMES A DAY (Patient taking differently: Take 300 mg by mouth 3 (three) times daily. ) 90 capsule 1  . KLOR-CON M20 20 MEQ tablet Take 40-60 mEq by mouth See admin instructions. 60 meq in the morning, 40 meq twice daily (midday, and bedtime)  3  . levothyroxine (SYNTHROID) 112 MCG tablet Take 1 tablet (112 mcg total) by mouth daily. (Patient taking differently: Take 112 mcg by mouth daily before breakfast. ) 90 tablet 3  . losartan (COZAAR) 25 MG tablet Take 12.5 mg by mouth daily.     . Multiple Vitamin (MULTIVITAMIN) capsule Take 1 capsule by  mouth daily.      [name] omeprazole (PRILOSEC) 20 MG capsule Take 20 mg by mouth daily as needed (acid reflux).     . torsemide (DEMADEX) 20 MG tablet Take 40-60 mg by mouth See admin instructions. 600mg  in the morning and 40mg  evening time.    . traMADol (ULTRAM) 50 MG tablet Take 1 tablet (50 mg total) by mouth every 6 (six) hours as needed. (Patient taking differently: Take 50 mg by mouth every 6 (six) hours as needed (pain). ) 30 tablet 0  .  guaiFENesin (MUCINEX) 600 MG 12 hr tablet Take 2 tablets (1,200 mg total) by mouth 2 (two) times daily as needed for to loosen phlegm. 60 tablet 1  . ibuprofen (ADVIL,MOTRIN) 600 MG tablet Take 1 tablet (600 mg total) by mouth 3 (three) times daily. (Patient taking differently: Take 600 mg by mouth every 8 (eight) hours as needed (pain). ) 90 tablet 2  . Menthol-Methyl Salicylate (MUSCLE RUB) 10-15 % CREA Apply 1 application topically as needed for muscle pain.    . valACYclovir (VALTREX) 1000 MG tablet Take 500 mg by mouth daily as needed (cold sores).   0    Results for orders placed or performed during the hospital encounter of 09/11/19 (from the past 48 hour(s))  Glucose, capillary     Status: None   Collection Time: 09/11/19  7:05 AM  Result Value Ref Range   Glucose-Capillary 96 70 - 99 mg/dL   No results found.  Review of Systems  Musculoskeletal: Positive for back pain and joint pain.  Neurological: Positive for tingling and sensory change.    Blood pressure 137/76, pulse 92, temperature 98.2 F (36.8 C), temperature source Oral, resp. rate 18, height 5\' 4"  (1.626 m), weight 104.3 kg, SpO2 100 %. Physical Exam  Constitutional: She is oriented to person, place, and time. She appears well-developed.  HENT:  Head: Normocephalic.  Eyes: Pupils are equal, round, and reactive to light.  Neck: Normal range of motion.  Cardiovascular: Normal rate.  GI: Soft. Bowel sounds are normal.  Musculoskeletal: Normal range of motion.  Neurological: She is  alert and oriented to person, place, and time. She has normal strength. GCS eye subscore is 4. GCS verbal subscore is 5. GCS motor subscore is 6.  Strength is 5 out of 5 iliopsoas, quads, hamstrings, gastrocs, into tibialis, and EHL.     Assessment/Plan 63 year old female presents for decompressive laminectomy and interbody fusion at L4-5  Clayten Allcock P, MD 09/11/2019, 8:01 AM

## 2019-09-11 NOTE — Anesthesia Postprocedure Evaluation (Signed)
Anesthesia Post Note  Patient: Jamarie Seader  Procedure(s) Performed: Posterior Lumbar nterbody Fusion  - Lumbar four-Lumbar five (N/A Back)     Patient location during evaluation: PACU Anesthesia Type: General Level of consciousness: awake and alert Pain management: pain level controlled Vital Signs Assessment: post-procedure vital signs reviewed and stable Respiratory status: spontaneous breathing, nonlabored ventilation, respiratory function stable and patient connected to nasal cannula oxygen Cardiovascular status: blood pressure returned to baseline and stable Postop Assessment: no apparent nausea or vomiting Anesthetic complications: no    Last Vitals:  Vitals:   09/11/19 1300 09/11/19 1355  BP: (!) 139/93 110/67  Pulse: 71 75  Resp: 10 18  Temp: (!) 36.4 C 36.5 C  SpO2: 95% 96%    Last Pain:  Vitals:   09/11/19 1355  TempSrc: Oral  PainSc:                  Ryan P Ellender

## 2019-09-11 NOTE — Anesthesia Procedure Notes (Signed)
Procedure Name: Intubation Date/Time: 09/11/2019 8:41 AM Performed by: Janace Litten, CRNA Pre-anesthesia Checklist: Patient identified, Emergency Drugs available, Suction available and Patient being monitored Patient Re-evaluated:Patient Re-evaluated prior to induction Oxygen Delivery Method: Circle System Utilized Preoxygenation: Pre-oxygenation with 100% oxygen Induction Type: IV induction and Rapid sequence Ventilation: Mask ventilation without difficulty Laryngoscope Size: Glidescope and 3 Grade View: Grade I Tube type: Oral Tube size: 7.0 mm Number of attempts: 1 Airway Equipment and Method: Stylet Placement Confirmation: ETT inserted through vocal cords under direct vision,  positive ETCO2 and breath sounds checked- equal and bilateral Secured at: 21 cm Tube secured with: Tape Dental Injury: Teeth and Oropharynx as per pre-operative assessment  Difficulty Due To: Difficult Airway- due to anterior larynx and Difficult Airway- due to limited oral opening Future Recommendations: Recommend- induction with short-acting agent, and alternative techniques readily available Comments: Pt states after last general anesthetic her "front caps felt loose." Chose to electively use glidescope due to airway assessment.

## 2019-09-11 NOTE — Transfer of Care (Signed)
Immediate Anesthesia Transfer of Care Note  Patient: Suzanne Hansen  Procedure(s) Performed: Posterior Lumbar nterbody Fusion  - Lumbar four-Lumbar five (N/A Back)  Patient Location: PACU  Anesthesia Type:General  Level of Consciousness: drowsy and patient cooperative  Airway & Oxygen Therapy: Patient Spontanous Breathing and Patient connected to nasal cannula oxygen  Post-op Assessment: Report given to RN and Post -op Vital signs reviewed and stable  Post vital signs: Reviewed and stable  Last Vitals:  Vitals Value Taken Time  BP 132/75 09/11/19 1129  Temp    Pulse 93 09/11/19 1133  Resp 17 09/11/19 1133  SpO2 98 % 09/11/19 1133  Vitals shown include unvalidated device data.  Last Pain:  Vitals:   09/11/19 0713  TempSrc:   PainSc: 5          Complications: No apparent anesthesia complications

## 2019-09-11 NOTE — Op Note (Signed)
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  Posterior lumbar interbody fusion utilizing the globus insert and rotate titanium cages packed with locally harvested autograft mixed with Arthrex Allosync pure  3.  Cortical screw fixation utilizing globus Creo modular cortical screw set at L4-5  Surgeon: Dominica Severin Morgon Pamer  Assistant: Nash Shearer  Anesthesia: General  EBL: Minimal  HPI: 63 year old female with longstanding back pain previously undergone laminotomy for resection of synovial cyst and did fairly well however last several months is a progressive worsening back and bilateral leg pain refractory to all forms of conservative treatment.  Work-up revealed significant diastases of her facet joints and instability with motion on flexion-extension films.  Due to progression of clinical syndrome imaging findings and failed conservative treatment I recommended decompression stabilization procedure at L4-5 I extensively went over the risks and benefits of the operation with her as well as perioperative course expectations of outcome and alternatives of surgery and she understands and agrees to proceed forward.  Operative procedure: Patient brought into the OR was due to general anesthesia positioned prone the Wilson frame her back was prepped and draped in routine sterile fashion.  Preoperative x-ray localized the appropriate level.  So after infiltration of 10 cc lidocaine with epi midline incision was made scar tissue was dissected free exposing the residual of  the laminotomy defect on the left at L4-5 as well as subperiosteal dissection carried out on the lamina on the right.  Identified the cortical screw entry points at the L4 pedicle and confirmed by intraoperative x-ray.  I then remove the spinous process and began a central decompression.  Working on the patient's left side through the scar tissue as I said scar tissue 3 and performed a complete medial facetectomy.  I aggressively under bit the supra articulating facet complex to gain access to the lateral margin of the disc base and come performed a complete foraminotomies the L4 and L5 nerve root decompressing them bilaterally.  On the right at L4 there was a large supra articulating facet digging into the distal surface of the L4 nerve root and this was all removed until the L4 nerve root on the right was completely unroofed and decompressed.  Under biting of the superior to collating facet gained access to the lateral margin of the space there as well.  Then epidural veins were coagulated the space was cleaned out from both sides endplates prepared and this was removed.  Working subfascially on the patient's right side I did identify the iliac crest and aspirated bone marrow to mix with the Allosync to mix with my autograft.  I packed both cages with the autograft mixture packed an extensive amount of autograft mix centrally between the cages and inserted both cages under fluoroscopy.  Then pilot holes were drilled at L4 and L5 cortical screw entry sites were then drilled tapped probed and 30 mm 6 oh/5 oh cortical screw placed at both L4 and L5.  Heads were assembled screws were advanced after fluoroscopy confirmed good position I then compressed the L4 screw against L5 and anchored everything in place.  Wounds and copiously irrigated to Kassim  states was maintained the foramina reinspected to confirm patency and no migration of graft material.  Gelfoam was overlaid top of the dura Exparel was injected in the  fascia and the wounds closed in layers after placement of a medium Hemovac drain with interrupted Vicryl in a running 4 subcuticular Dermabond benzoin Steri-Strips and a sterile dressing was applied patient recovery in stable condition.  At the end the case all needle count sponge counts were correct.

## 2019-09-12 ENCOUNTER — Other Ambulatory Visit (INDEPENDENT_AMBULATORY_CARE_PROVIDER_SITE_OTHER): Payer: Self-pay | Admitting: Bariatrics

## 2019-09-12 DIAGNOSIS — F3289 Other specified depressive episodes: Secondary | ICD-10-CM

## 2019-09-12 LAB — GLUCOSE, CAPILLARY
Glucose-Capillary: 152 mg/dL — ABNORMAL HIGH (ref 70–99)
Glucose-Capillary: 96 mg/dL (ref 70–99)
Glucose-Capillary: 115 mg/dL — ABNORMAL HIGH (ref 70–99)
Glucose-Capillary: 90 mg/dL (ref 70–99)

## 2019-09-12 MED ORDER — INSULIN ASPART 100 UNIT/ML ~~LOC~~ SOLN: 0.0000 [IU] | Freq: Every day | SUBCUTANEOUS | Status: DC

## 2019-09-12 MED ORDER — INSULIN ASPART 100 UNIT/ML ~~LOC~~ SOLN: 0.0000 [IU] | Freq: Three times a day (TID) | SUBCUTANEOUS | Status: DC

## 2019-09-12 NOTE — Evaluation (Signed)
Physical Therapy Evaluation Patient Details Name: Suzanne Hansen MRN: [id number] DOB: 27-Mar-1956 Today's Date: 09/12/2019   History of Present Illness  Pt is a 63 yo female s/p PLIF L4-5. PMHx: CHF, headaches, obesity, ERD, DMT2, HLD, OSA.  Clinical Impression  Pt admitted with above diagnosis. At the time of PT eval, pt was able to demonstrate transfers and ambulation with gross supervision for safety. Min assist provided for stair training at times. Pt was educated on precautions, brace application/wearing schedule, appropriate activity progression, and car transfer. Pt currently with functional limitations due to the deficits listed below (see PT Problem List). Pt will benefit from skilled PT to increase their independence and safety with mobility to allow discharge to the venue listed below.      Follow Up Recommendations No PT follow up;Supervision for mobility/OOB    Equipment Recommendations  Rolling walker with 5" wheels    Recommendations for Other Services       Precautions / Restrictions Precautions Precautions: Back Precaution Booklet Issued: Yes (comment) Precaution Comments: Reviewed handout and pt was cued for precautions during functional mobility.  Required Braces or Orthoses: Spinal Brace Spinal Brace: Lumbar corset;Applied in sitting position Restrictions Weight Bearing Restrictions: No      Mobility  Bed Mobility               General bed mobility comments: Pt sitting up EOB with brace donned when PT arrived  Transfers Overall transfer level: Needs assistance Equipment used: Rolling walker (2 wheeled) Transfers: Sit to/from Stand Sit to Stand: Supervision         General transfer comment: Increased time due to pain but pt able to complete without assistance.   Ambulation/Gait Ambulation/Gait assistance: Supervision Gait Distance (Feet): 200 Feet Assistive device: Rolling walker (2 wheeled) Gait Pattern/deviations: Step-through  pattern;Decreased stride length;Trunk flexed Gait velocity: Decreased Gait velocity interpretation: <1.8 ft/sec, indicate of risk for recurrent falls General Gait Details: VC's for improved posture and forward gaze.   Stairs Stairs: Yes Stairs assistance: Min guard; Min assist Stair Management: One rail Left;Step to pattern;Forwards Number of Stairs: 4 General stair comments: VC's for sequencing and general safety. HHA provided on the R to simulate home environment.   Wheelchair Mobility    Modified Rankin (Stroke Patients Only)       Balance Overall balance assessment: Needs assistance Sitting-balance support: No upper extremity supported Sitting balance-Leahy Scale: Fair     Standing balance support: No upper extremity supported;During functional activity Standing balance-Leahy Scale: Poor Standing balance comment: leaning on sink while washing hands                             Pertinent Vitals/Pain Pain Assessment: No/denies pain    Home Living Family/patient expects to be discharged to:: Private residence Living Arrangements: Children Available Help at Discharge: Family;Available PRN/intermittently Type of Home: House Home Access: Stairs to enter Entrance Stairs-Rails: None Entrance Stairs-Number of Steps: 2 Home Layout: Two level;1/2 bath on main level(has recliner on ML to sleep in. pt reports its uncomfortable) Home Equipment: None      Prior Function Level of Independence: Independent with assistive device(s)         Comments: Pt reports was slow, but able to perform own ADL and mobility tasks     Hand Dominance   Dominant Hand: Right    Extremity/Trunk Assessment   Upper Extremity Assessment Upper Extremity Assessment: Generalized weakness    Lower Extremity Assessment Lower  Extremity Assessment: Generalized weakness    Cervical / Trunk Assessment Cervical / Trunk Assessment: Other exceptions Cervical / Trunk Exceptions: s/p  lumbar sx  Communication   Communication: No difficulties  Cognition Arousal/Alertness: Awake/alert Behavior During Therapy: WFL for tasks assessed/performed Overall Cognitive Status: Within Functional Limits for tasks assessed                                        General Comments      Exercises     Assessment/Plan    PT Assessment Patient needs continued PT services  PT Problem List Decreased strength;Decreased activity tolerance;Decreased balance;Decreased mobility;Decreased knowledge of use of DME;Decreased safety awareness;Decreased knowledge of precautions;Pain       PT Treatment Interventions DME instruction;Gait training;Stair training;Functional mobility training;Therapeutic activities;Therapeutic exercise;Neuromuscular re-education;Patient/family education    PT Goals (Current goals can be found in the Care Plan section)  Acute Rehab PT Goals Patient Stated Goal: to go home PT Goal Formulation: With patient Time For Goal Achievement: 09/19/19 Potential to Achieve Goals: Good    Frequency Min 5X/week   Barriers to discharge        Co-evaluation               AM-PAC PT "6 Clicks" Mobility  Outcome Measure Help needed turning from your back to your side while in a flat bed without using bedrails?: None Help needed moving from lying on your back to sitting on the side of a flat bed without using bedrails?: A Little Help needed moving to and from a bed to a chair (including a wheelchair)?: A Little Help needed standing up from a chair using your arms (e.g., wheelchair or bedside chair)?: A Little Help needed to walk in hospital room?: A Little Help needed climbing 3-5 steps with a railing? : A Little 6 Click Score: 19    End of Session Equipment Utilized During Treatment: Gait belt;Back brace Activity Tolerance: Patient tolerated treatment well Patient left: in bed;with call bell/phone within reach Nurse Communication: Mobility  status PT Visit Diagnosis: Unsteadiness on feet (R26.81);Pain Pain - part of body: (back)    Time: [id number] PT Time Calculation (min) (ACUTE ONLY): 31 min   Charges:   PT Evaluation $PT Eval Moderate Complexity: 1 Mod PT Treatments $Gait Training: 8-22 mins        Rolinda Roan, PT, DPT Acute Rehabilitation Services Pager: [phone] Office: [phone]   Thelma Comp 09/12/2019, 1:01 PM

## 2019-09-12 NOTE — Evaluation (Signed)
Occupational Therapy Evaluation Patient Details Name: Suzanne Hansen MRN: [id number] DOB: 03/31/1956 Today's Date: 09/12/2019    History of Present Illness Pt is a 63 yo female s/p PLIF L4-5. PMHx: CHF, headaches, obesity, ERD, DMT2, HLD, OSA.   Clinical Impression   Pt PTA: Pt living alone and mostly independent with ADL and mobility. Pt reports sleeping in recliner x2 weeks in main level, but unable to manage stairs often as pt has a full flight to bedroom. Pt recently moved into new home with daughter. Pt currently limited by decreased strength, increased pain and decreased ability to properly care for self. Pt education for LB ADL AE for LB dressing/bathing performed. Pt donning brace with minA overall for proper technique. Pt would greatly benefit from continued OT skilled services for ADL, mobility and safety. OT following acutely for LB dress needs.     Follow Up Recommendations  Home health OT;Supervision/Assistance - 24 hour    Equipment Recommendations  3 in 1 bedside commode;Other (comment)(hip kit)    Recommendations for Other Services       Precautions / Restrictions Precautions Precautions: Back Precaution Booklet Issued: Yes (comment) Precaution Comments: verbal discussion of back precautions Required Braces or Orthoses: Spinal Brace Spinal Brace: Lumbar corset Restrictions Weight Bearing Restrictions: No      Mobility Bed Mobility Overal bed mobility: Needs Assistance Bed Mobility: Sidelying to Sit;Supine to Sit;Sit to Supine   Sidelying to sit: Supervision Supine to sit: Supervision Sit to supine: Supervision   General bed mobility comments: use of rail; education for buying bed rail.  Transfers Overall transfer level: Needs assistance Equipment used: Rolling walker (2 wheeled) Transfers: Sit to/from [facility] Sit to Stand: Supervision Stand pivot transfers: Supervision            Balance Overall balance assessment: Needs  assistance   Sitting balance-Leahy Scale: Fair       Standing balance-Leahy Scale: Good                             ADL either performed or assessed with clinical judgement   ADL Overall ADL's : Needs assistance/impaired Eating/Feeding: Modified independent;Sitting   Grooming: Set up;Standing Grooming Details (indicate cue type and reason): pt given 2 cups, 1 for rinsing/1 for spitting. Upper Body Bathing: Set up;Sitting   Lower Body Bathing: Minimal assistance;Moderate assistance;Cueing for safety;Sitting/lateral leans;Sit to/from stand   Upper Body Dressing : Minimal assistance Upper Body Dressing Details (indicate cue type and reason): MinA to donn brace as pt had the back portion not centered Lower Body Dressing: Minimal assistance;Moderate assistance;Sitting/lateral leans;Sit to/from stand;With adaptive equipment;Cueing for safety;Cueing for sequencing Lower Body Dressing Details (indicate cue type and reason): MinA overall for donning socks and refusing at this time to change clothes as pt plans to stay another night. LB AE education provided and pt very interested in hip kit.  Toilet Transfer: Supervision/safety;Comfort height toilet;Grab bars   Toileting- Clothing Manipulation and Hygiene: Supervision/safety;Sitting/lateral lean;Sit to/from stand;Cueing for safety   Tub/ Banker: Copy Details (indicate cue type and reason): pt able to step up for normal step height for walk in shower Functional mobility during ADLs: Min guard;Rolling walker;Cueing for safety General ADL Comments: Pt limited by decreased strength, increased pain and decreased ability to properly care for self.     Vision Baseline Vision/History: No visual deficits Vision Assessment?: No apparent visual deficits     Perception     Praxis  Pertinent Vitals/Pain Pain Assessment: No/denies pain     Hand Dominance Right   Extremity/Trunk  Assessment Upper Extremity Assessment Upper Extremity Assessment: Generalized weakness   Lower Extremity Assessment Lower Extremity Assessment: Generalized weakness   Cervical / Trunk Assessment Cervical / Trunk Assessment: Other exceptions Cervical / Trunk Exceptions: s/p lumbar sx   Communication Communication Communication: No difficulties   Cognition Arousal/Alertness: Awake/alert Behavior During Therapy: WFL for tasks assessed/performed Overall Cognitive Status: Within Functional Limits for tasks assessed                                     General Comments       Exercises     Shoulder Instructions      Home Living Family/patient expects to be discharged to:: Private residence Living Arrangements: Children Available Help at Discharge: Family;Available PRN/intermittently Type of Home: House Home Access: Stairs to enter [employer] of Steps: 2 Entrance Stairs-Rails: None Home Layout: Two level;1/2 bath on main level(has recliner on ML to sleep in. pt reports its uncomfortable) Alternate Level Stairs-Number of Steps: 15 Alternate Level Stairs-Rails: Can reach both Bathroom Shower/Tub: Tub only;Walk-in shower   Bathroom Toilet: Handicapped height                Prior Functioning/Environment Level of Independence: Independent with assistive device(s)        Comments: Pt reports was slow, but able to perform own ADL and mobility tasks        OT Problem List: Decreased strength;Decreased activity tolerance;Impaired balance (sitting and/or standing);Decreased safety awareness;Pain      OT Treatment/Interventions: Self-care/ADL training;DME and/or AE instruction;Therapeutic activities;Patient/family education;Balance training;Energy conservation    OT Goals(Current goals can be found in the care plan section) Acute Rehab OT Goals Patient Stated Goal: to go home OT Goal Formulation: With patient Time For Goal Achievement:  09/26/19 Potential to Achieve Goals: Good ADL Goals Pt Will Perform Lower Body Dressing: sitting/lateral leans;sit to/from stand;with adaptive equipment;with set-up Pt Will Perform Toileting - Clothing Manipulation and hygiene: with set-up;with adaptive equipment;sitting/lateral leans  OT Frequency: Min 2X/week   Barriers to D/C:            Co-evaluation              AM-PAC OT "6 Clicks" Daily Activity     Outcome Measure Help from another person eating meals?: None Help from another person taking care of personal grooming?: None Help from another person toileting, which includes using toliet, bedpan, or urinal?: A Little Help from another person bathing (including washing, rinsing, drying)?: A Little Help from another person to put on and taking off regular upper body clothing?: A Little Help from another person to put on and taking off regular lower body clothing?: A Lot 6 Click Score: 19   End of Session Equipment Utilized During Treatment: Rolling walker;Back brace Nurse Communication: Mobility status  Activity Tolerance: Patient tolerated treatment well;Patient limited by pain Patient left: in bed;with call bell/phone within reach  OT Visit Diagnosis: Unsteadiness on feet (R26.81);Muscle weakness (generalized) (M62.81);Pain Pain - part of body: (back)                Time: 6761-9509 OT Time Calculation (min): 48 min Charges:  OT General Charges $OT Visit: 1 Visit OT Evaluation $OT Eval Moderate Complexity: 1 Mod OT Treatments $Self Care/Home Management : 23-37 mins  Ebony Hail Harold Hedge) Marsa Aris OTR/L Acute Rehabilitation Services Pager: [phone]  Office: [place] 09/12/2019, 9:05 AM

## 2019-09-12 NOTE — Progress Notes (Signed)
Subjective: Patient reports moderate back soreness. Numbness in her legs are better. She is pleased  Objective: Vital signs in last 24 hours: Temp:  [97.5 F (36.4 C)-97.9 F (36.6 C)] 97.9 F (36.6 C) (12/03 0506) Pulse Rate:  [71-100] 87 (12/03 0506) Resp:  [10-18] 18 (12/03 0506) BP: (110-148)/(67-93) 134/74 (12/03 0506) SpO2:  [95 %-100 %] 99 % (12/03 0506)  Intake/Output from previous day: 12/02 0701 - 12/03 0700 In: 1740 [P.O.:240; I.V.:1400; IV Piggyback:100] Out: 100 [Drains:70; Blood:30] Intake/Output this shift: No intake/output data recorded.  Neurologic: Grossly normal  Lab Results: Lab Results  Component Value Date   WBC 9.6 09/03/2019   HGB 13.1 09/03/2019   HCT 41.4 09/03/2019   MCV 94.7 09/03/2019   PLT 289 09/03/2019   Lab Results  Component Value Date   INR 1.0 03/21/2017   BMET Lab Results  Component Value Date   NA 138 09/03/2019   K 4.4 09/03/2019   CL 103 09/03/2019   CO2 26 09/03/2019   GLUCOSE 87 09/03/2019   BUN 21 09/03/2019   CREATININE 1.47 (H) 09/03/2019   CALCIUM 9.6 09/03/2019    Studies/Results: Dg Lumbar Spine 2-3 Views  Result Date: 09/11/2019 CLINICAL DATA:  Lumbar spine fusion EXAM: LUMBAR SPINE - 2-3 VIEW COMPARISON:  None. FINDINGS: Intraoperative radiographs demonstrate paired pedicle screws at L4 and interbody cage at L4-L5. Disc space narrowing at L5-S1. IMPRESSION: Lumbar fusion at L4-L5. Electronically Signed   By: Macy Mis M.D.   On: 09/11/2019 11:09   Dg C-arm 1-60 Min  Result Date: 09/11/2019 CLINICAL DATA:  Lumbar fusion EXAM: DG C-ARM 1-60 MIN FLUOROSCOPY TIME:  Fluoroscopy Time:  55 seconds Number of Acquired Spot Images: 2 COMPARISON:  None. FINDINGS: Frontal and lateral intraoperative radiographs demonstrate paired pedicle screws at L4 and L5 and interbody cages at L4-L5. IMPRESSION: Fluoroscopic guidance for L4-L5 lumbar fusion. Refer to operative note for additional details. Electronically Signed   By:  Macy Mis M.D.   On: 09/11/2019 11:11    Assessment/Plan: Postop day 1 PLIF L4-L5. Doing well but feels she needs a little more therapy today since she has so many steps at home. Will plan for dc tomorrow morning   LOS: 1 day    Ocie Cornfield Meyran 09/12/2019, 8:09 AM

## 2019-09-13 ENCOUNTER — Telehealth: Payer: Self-pay | Admitting: *Deleted

## 2019-09-13 LAB — GLUCOSE, CAPILLARY: Glucose-Capillary: 111 mg/dL — ABNORMAL HIGH (ref 70–99)

## 2019-09-13 NOTE — Progress Notes (Signed)
Patient is discharged from room 3C05 at this time. Alert and in stable condition. IV site d/c'd and instructions read to patient and daughter with understanding verbalized. Left unit via wheelchair with all belongings at side.

## 2019-09-13 NOTE — Telephone Encounter (Signed)
Pt was on TCM report admitted 09/11/19 for Lumbar spinal stenosis and instability L4-5. Pt underwent decompressive laminectomy interbody fusion L4-5 postoperatively patient did very well went to recovery. Pt D/C 09/13/19, and will follow-up w/surgeon in 2 weeks.Marland KitchenJohny Chess

## 2019-09-13 NOTE — Discharge Summary (Signed)
Physician Discharge Summary  Patient ID: Suzanne Hansen MRN: [id number] DOB/AGE: 1956-06-23 63 y.o. Estimated body mass index is 39.48 kg/m as calculated from the following:   Height as of this encounter: 5\' 4"  (1.626 m).   Weight as of this encounter: 104.3 kg.   Admit date: 09/11/2019 Discharge date: 09/13/2019  Admission Diagnoses: Lumbar spinal stenosis and instability L4-5  Discharge Diagnoses: Same   Active Problems:   Spinal stenosis at L4-L5 level   Discharged Condition: good  Hospital Course: Patient is admitted to the hospital underwent decompressive laminectomy interbody fusion L4-5 postoperatively patient did very well went to recovery and then the floor on the floor was ambulating and voiding spontaneously working with physical therapy and cleared for discharge on hospital day 2.  Patient will be discharged scheduled follow-up in 1 to 2 weeks.  Consults: Significant Diagnostic Studies: Treatments: Decompressive laminectomy interbody fusion L4-5 Discharge Exam: Blood pressure 114/67, pulse 90, temperature 98 F (36.7 C), temperature source Oral, resp. rate 20, height 5\' 4"  (1.626 m), weight 104.3 kg, SpO2 97 %. Strength 5 out of 5 wound clean dry and intact  Disposition: Home  Discharge Instructions    DME Bedside commode   Complete by: As directed    Patient needs a bedside commode to treat with the following condition: Spinal stenosis of lumbar region   Face-to-face encounter (required for Medicare/Medicaid patients)   Complete by: As directed    I Venkat Ankney P certify that this patient is under my care and that I, or a nurse practitioner or physician's assistant working with me, had a face-to-face encounter that meets the physician face-to-face encounter requirements with this patient on 09/13/2019. The encounter with the patient was in whole, or in part for the following medical condition(s) which is the primary reason for home health care (List medical condition):  Lumbar spinal stenosis   The encounter with the patient was in whole, or in part, for the following medical condition, which is the primary reason for home health care: Lumbar spinal stenosis   I certify that, based on my findings, the following services are medically necessary home health services: Physical therapy   Reason for Medically Necessary Home Health Services: Therapy- Instruction on Safe use of Assistive Devices for ADLs   My clinical findings support the need for the above services: Pain interferes with ambulation/mobility   Further, I certify that my clinical findings support that this patient is homebound due to: Unable to leave home safely without assistance   Home Health   Complete by: As directed    To provide the following care/treatments: OT   Walker rolling   Complete by: As directed      Allergies as of 09/13/2019      Reactions   Shellfish Allergy Anaphylaxis   Clarithromycin Nausea And Vomiting   Latex Itching, Other (See Comments)   ITCHING AND COLD SORES AROUND MOUTH WHEN LATEX GLOVES USED BY THE DENTIST/HYGENTIST   Metronidazole Nausea And Vomiting   Zostavax [zoster Vaccine Live]    Arm swelled   Adhesive [tape] Dermatitis   Plastic tape    Lisinopril Cough   Peanut Oil    Pt says it's ok      Medication List    STOP taking these medications   acetaminophen 650 MG CR tablet Commonly known as: TYLENOL   ibuprofen 600 MG tablet Commonly known as: ADVIL   Voltaren 1 % Gel Generic drug: diclofenac Sodium     TAKE these medications  albuterol 108 (90 Base) MCG/ACT inhaler Commonly known as: VENTOLIN HFA Inhale 2 puffs into the lungs every 4 (four) hours as needed for wheezing or shortness of breath.   allopurinol 100 MG tablet Commonly known as: ZYLOPRIM Take 2 tablets (200 mg total) by mouth daily. Take 200 mg by mouth daily.   aspirin 81 MG EC tablet Take 81 mg by mouth daily.   atorvastatin 20 MG tablet Commonly known as: Lipitor Take 1  tablet (20 mg total) by mouth daily.   budesonide-formoterol 160-4.5 MCG/ACT inhaler Commonly known as: SYMBICORT INHALE 2 PUFFS INTO THE LUNGS TWICE DAILY   buPROPion 150 MG 12 hr tablet Commonly known as: WELLBUTRIN SR TAKE 1 TABLET(150 MG) BY MOUTH DAILY What changed: See the new instructions.   cyclobenzaprine 10 MG tablet Commonly known as: FLEXERIL Take 1 tablet (10 mg total) by mouth at bedtime as needed for muscle spasms.   fexofenadine 180 MG tablet Commonly known as: ALLEGRA Take 180 mg by mouth daily.   fluticasone 50 MCG/ACT nasal spray Commonly known as: FLONASE Place 2 sprays into both nostrils daily. What changed:   when to take this  reasons to take this   gabapentin 300 MG capsule Commonly known as: NEURONTIN TAKE 1 CAPSULE BY MOUTH THREE TIMES A DAY What changed: See the new instructions.   guaiFENesin 600 MG 12 hr tablet Commonly known as: Mucinex Take 2 tablets (1,200 mg total) by mouth 2 (two) times daily as needed for to loosen phlegm.   Klor-Con M20 20 MEQ tablet Generic drug: potassium chloride SA Take 40-60 mEq by mouth See admin instructions. 60 meq in the morning, 40 meq twice daily (midday, and bedtime)   levothyroxine 112 MCG tablet Commonly known as: SYNTHROID Take 1 tablet (112 mcg total) by mouth daily. What changed: when to take this   losartan 25 MG tablet Commonly known as: COZAAR Take 12.5 mg by mouth daily.   multivitamin capsule Take 1 capsule by mouth daily.   Muscle Rub 10-15 % Crea Apply 1 application topically as needed for muscle pain.   omeprazole 20 MG capsule Commonly known as: PRILOSEC Take 20 mg by mouth daily as needed (acid reflux).   torsemide 20 MG tablet Commonly known as: DEMADEX Take 40-60 mg by mouth See admin instructions. 60 mg in the morning and 40 mg evening time.   traMADol 50 MG tablet Commonly known as: ULTRAM Take 1 tablet (50 mg total) by mouth every 6 (six) hours as needed. What  changed: reasons to take this   valACYclovir 1000 MG tablet Commonly known as: VALTREX Take 500 mg by mouth daily as needed (cold sores).   Vitamin B-12 1000 MCG Subl Place 1 tablet (1,000 mcg total) under the tongue daily.   Vitamin D 50 MCG (2000 UT) Caps Take 2,000 Units by mouth daily.            Durable Medical Equipment  (From admission, onward)         Start     Ordered   09/13/19 0000  DME Bedside commode    Question:  Patient needs a bedside commode to treat with the following condition  Answer:  Spinal stenosis of lumbar region   09/13/19 0814           Signed: Ismail Graziani P 09/13/2019, 8:14 AM

## 2019-09-13 NOTE — Progress Notes (Signed)
Physical Therapy Treatment Patient Details Name: Suzanne Hansen MRN: [id number] DOB: October 24, 1955 Today's Date: 09/13/2019    History of Present Illness Pt is a 63 yo female s/p PLIF L4-5. PMHx: CHF, headaches, obesity, ERD, DMT2, HLD, OSA.    PT Comments    Pt progressing well with post-op mobility. She was able to demonstrate transfers and ambulation with gross supervision for safety and RW for support. Reinforced education on precautions, brace application/wearing schedule, appropriate activity progression, and car transfer. Pt was able to complete stair training this session and negotiated a full flight. Will continue to follow.      Follow Up Recommendations  No PT follow up;Supervision for mobility/OOB     Equipment Recommendations  Rolling walker with 5" wheels    Recommendations for Other Services       Precautions / Restrictions Precautions Precautions: Back Precaution Booklet Issued: Yes (comment) Precaution Comments: Reviewed handout and pt was cued for precautions during functional mobility.  Required Braces or Orthoses: Spinal Brace Spinal Brace: Lumbar corset;Applied in sitting position Restrictions Weight Bearing Restrictions: No    Mobility  Bed Mobility               General bed mobility comments: Pt sitting up in recliner upon PT arrival  Transfers Overall transfer level: Needs assistance Equipment used: Rolling walker (2 wheeled) Transfers: Sit to/from Stand Sit to Stand: Supervision Stand pivot transfers: Supervision       General transfer comment: Increased time due to pain but pt able to complete without assistance.   Ambulation/Gait Ambulation/Gait assistance: Supervision Gait Distance (Feet): 400 Feet Assistive device: Rolling walker (2 wheeled) Gait Pattern/deviations: Step-through pattern;Decreased stride length;Trunk flexed Gait velocity: Decreased Gait velocity interpretation: <1.8 ft/sec, indicate of risk for recurrent  falls General Gait Details: VC's for improved posture and forward gaze.    Stairs   Stairs assistance: Min guard;Min assist Stair Management: One rail Left;Step to pattern;Forwards Number of Stairs: 10 General stair comments: VC's for sequencing and general safety. HHA provided on the R to simulate home environment.    Wheelchair Mobility    Modified Rankin (Stroke Patients Only)       Balance Overall balance assessment: Needs assistance Sitting-balance support: No upper extremity supported Sitting balance-Leahy Scale: Fair     Standing balance support: No upper extremity supported;During functional activity Standing balance-Leahy Scale: Fair                              Cognition Arousal/Alertness: Awake/alert Behavior During Therapy: WFL for tasks assessed/performed Overall Cognitive Status: Within Functional Limits for tasks assessed                                        Exercises      General Comments        Pertinent Vitals/Pain Pain Assessment: No/denies pain    Home Living                      Prior Function            PT Goals (current goals can now be found in the care plan section) Acute Rehab PT Goals Patient Stated Goal: to go home PT Goal Formulation: With patient Time For Goal Achievement: 09/19/19 Potential to Achieve Goals: Good Progress towards PT goals: Progressing toward goals    Frequency  Min 5X/week      PT Plan Current plan remains appropriate    Co-evaluation              AM-PAC PT "6 Clicks" Mobility   Outcome Measure  Help needed turning from your back to your side while in a flat bed without using bedrails?: None Help needed moving from lying on your back to sitting on the side of a flat bed without using bedrails?: A Little Help needed moving to and from a bed to a chair (including a wheelchair)?: A Little Help needed standing up from a chair using your arms (e.g.,  wheelchair or bedside chair)?: A Little Help needed to walk in hospital room?: A Little Help needed climbing 3-5 steps with a railing? : A Little 6 Click Score: 19    End of Session Equipment Utilized During Treatment: Gait belt;Back brace Activity Tolerance: Patient tolerated treatment well Patient left: in bed;with call bell/phone within reach Nurse Communication: Mobility status PT Visit Diagnosis: Unsteadiness on feet (R26.81);Pain Pain - part of body: (back)     Time: [id number] PT Time Calculation (min) (ACUTE ONLY): 24 min  Charges:  $Gait Training: 23-37 mins                     Rolinda Roan, PT, DPT Acute Rehabilitation Services Pager: [phone] Office: [phone]    Thelma Comp 09/13/2019, 9:02 AM

## 2019-09-13 NOTE — Discharge Instructions (Signed)

## 2019-09-13 NOTE — Progress Notes (Signed)
Occupational Therapy Treatment Patient Details Name: Evamaria Detore MRN: [id number] DOB: 07/30/1956 Today's Date: 09/13/2019    History of present illness Pt is a 63 yo female s/p PLIF L4-5. PMHx: CHF, headaches, obesity, ERD, DMT2, HLD, OSA.   OT comments  Pt progressing well and D/C home today.  Pt with increased ability to perform UB/LB dressing today. Pt using LB AE from hip kit for ADL tasks with good carry over skills from demo to performance. Pt donning/doffing brace with no difficulty. Pt reports her daughter has purchased a hip kit online. Pt ambulating well with RW. Pt continues to progress. Pt would benefit from continued OT skilled services for ADL, mobility and safety in [place] setting. OT following.    Follow Up Recommendations  Home health OT    Equipment Recommendations  3 in 1 bedside commode    Recommendations for Other Services      Precautions / Restrictions Precautions Precautions: Back Precaution Booklet Issued: Yes (comment) Precaution Comments: verbal discussion; able to state 3/3 back precautions Required Braces or Orthoses: Spinal Brace Spinal Brace: Lumbar corset;Applied in sitting position Restrictions Weight Bearing Restrictions: No       Mobility Bed Mobility               General bed mobility comments: Pt in recliner  Transfers Overall transfer level: Needs assistance Equipment used: Rolling walker (2 wheeled) Transfers: Sit to/from Stand Sit to Stand: Supervision Stand pivot transfers: Supervision       General transfer comment: Increased time due to soreness    Balance Overall balance assessment: Needs assistance Sitting-balance support: No upper extremity supported Sitting balance-Leahy Scale: Fair     Standing balance support: No upper extremity supported;During functional activity Standing balance-Leahy Scale: Fair                             ADL either performed or assessed with clinical judgement   ADL  Overall ADL's : Needs assistance/impaired                 Upper Body Dressing : Set up;Standing Upper Body Dressing Details (indicate cue type and reason): donning brace Lower Body Dressing: Set up;Cueing for safety;Cueing for sequencing;Sitting/lateral leans;Sit to/from stand;With adaptive equipment   Toilet Transfer: Supervision/safety;Ambulation;Regular Toilet           Functional mobility during ADLs: Supervision/safety;Rolling walker General ADL Comments: Pt with increased ability to perform UB/LB dressing today. Pt using LB AE from hip kit for ADL tasks with good carry over skills from demo to performance. Pt donning/doffing brace with no difficulty.     Vision       Perception     Praxis      Cognition Arousal/Alertness: Awake/alert Behavior During Therapy: WFL for tasks assessed/performed Overall Cognitive Status: Within Functional Limits for tasks assessed                                          Exercises     Shoulder Instructions       General Comments Pt's daughter bought hip kit online.    Pertinent Vitals/ Pain       Pain Assessment: 0-10 Pain Score: 3  Pain Location: low back Pain Descriptors / Indicators: Sore Pain Intervention(s): Monitored during session  Home Living  Prior Functioning/Environment              Frequency  Min 2X/week        Progress Toward Goals  OT Goals(current goals can now be found in the care plan section)  Progress towards OT goals: Progressing toward goals  Acute Rehab OT Goals Patient Stated Goal: to go home OT Goal Formulation: With patient Time For Goal Achievement: 09/26/19 Potential to Achieve Goals: Good ADL Goals Pt Will Perform Lower Body Dressing: sitting/lateral leans;sit to/from stand;with adaptive equipment;with set-up Pt Will Perform Toileting - Clothing Manipulation and hygiene: with set-up;with adaptive  equipment;sitting/lateral leans  Plan Discharge plan remains appropriate    Co-evaluation                 AM-PAC OT "6 Clicks" Daily Activity     Outcome Measure   Help from another person eating meals?: None Help from another person taking care of personal grooming?: None Help from another person toileting, which includes using toliet, bedpan, or urinal?: A Little Help from another person bathing (including washing, rinsing, drying)?: A Little Help from another person to put on and taking off regular upper body clothing?: A Little Help from another person to put on and taking off regular lower body clothing?: A Lot 6 Click Score: 19    End of Session Equipment Utilized During Treatment: Rolling walker;Back brace  OT Visit Diagnosis: Unsteadiness on feet (R26.81);Muscle weakness (generalized) (M62.81);Pain Pain - part of body: (back)   Activity Tolerance Patient tolerated treatment well   Patient Left in chair;with call bell/phone within reach   Nurse Communication Mobility status        Time: 9892-1194 OT Time Calculation (min): 21 min  Charges: OT General Charges $OT Visit: 1 Visit OT Treatments $Self Care/Home Management : 8-22 mins  Darryl Nestle) Marsa Aris OTR/L Acute Rehabilitation Services Pager: [phone] Office: [place] 09/13/2019, 10:23 AM

## 2019-09-23 ENCOUNTER — Other Ambulatory Visit: Payer: Self-pay | Admitting: Podiatry

## 2019-09-24 ENCOUNTER — Ambulatory Visit: Payer: Managed Care, Other (non HMO) | Admitting: Internal Medicine

## 2019-10-02 ENCOUNTER — Other Ambulatory Visit: Payer: Self-pay

## 2019-10-02 ENCOUNTER — Encounter: Payer: Managed Care, Other (non HMO) | Admitting: Internal Medicine

## 2019-10-02 ENCOUNTER — Ambulatory Visit (INDEPENDENT_AMBULATORY_CARE_PROVIDER_SITE_OTHER): Payer: Managed Care, Other (non HMO) | Admitting: Internal Medicine

## 2019-10-02 ENCOUNTER — Encounter: Payer: Self-pay | Admitting: Internal Medicine

## 2019-10-02 VITALS — BP 130/78 | HR 77 | Temp 98.6°F | Wt 240.6 lb

## 2019-10-02 DIAGNOSIS — N183 Chronic kidney disease, stage 3 unspecified: Secondary | ICD-10-CM

## 2019-10-02 DIAGNOSIS — R7303 Prediabetes: Secondary | ICD-10-CM | POA: Diagnosis not present

## 2019-10-02 DIAGNOSIS — B3731 Acute candidiasis of vulva and vagina: Secondary | ICD-10-CM

## 2019-10-02 DIAGNOSIS — E0590 Thyrotoxicosis, unspecified without thyrotoxic crisis or storm: Secondary | ICD-10-CM | POA: Diagnosis not present

## 2019-10-02 DIAGNOSIS — I429 Cardiomyopathy, unspecified: Secondary | ICD-10-CM | POA: Diagnosis not present

## 2019-10-02 DIAGNOSIS — E559 Vitamin D deficiency, unspecified: Secondary | ICD-10-CM | POA: Diagnosis not present

## 2019-10-02 DIAGNOSIS — E790 Hyperuricemia without signs of inflammatory arthritis and tophaceous disease: Secondary | ICD-10-CM | POA: Diagnosis not present

## 2019-10-02 DIAGNOSIS — E538 Deficiency of other specified B group vitamins: Secondary | ICD-10-CM

## 2019-10-02 DIAGNOSIS — N3281 Overactive bladder: Secondary | ICD-10-CM

## 2019-10-02 DIAGNOSIS — E038 Other specified hypothyroidism: Secondary | ICD-10-CM

## 2019-10-02 DIAGNOSIS — E611 Iron deficiency: Secondary | ICD-10-CM

## 2019-10-02 DIAGNOSIS — B373 Candidiasis of vulva and vagina: Secondary | ICD-10-CM

## 2019-10-02 LAB — LIPID PANEL
Cholesterol: 132 mg/dL (ref 0–200)
HDL: 69.2 mg/dL (ref >39.00)
LDL Cholesterol: 48 mg/dL (ref 0–99)
NonHDL: 62.63
Total CHOL/HDL Ratio: 2
Triglycerides: 75 mg/dL (ref 0.0–149.0)
VLDL: 15 mg/dL (ref 0.0–40.0)

## 2019-10-02 LAB — HEPATIC FUNCTION PANEL
ALT: 18 U/L (ref 0–35)
AST: 17 U/L (ref 0–37)
Albumin: 4.2 g/dL (ref 3.5–5.2)
Alkaline Phosphatase: 164 U/L — ABNORMAL HIGH (ref 39–117)
Bilirubin, Direct: 0.1 mg/dL (ref 0.0–0.3)
Total Bilirubin: 0.5 mg/dL (ref 0.2–1.2)
Total Protein: 6.5 g/dL (ref 6.0–8.3)

## 2019-10-02 LAB — URINALYSIS, ROUTINE W REFLEX MICROSCOPIC
Bilirubin Urine: NEGATIVE
Hgb urine dipstick: NEGATIVE
Ketones, ur: NEGATIVE
Leukocytes,Ua: NEGATIVE
Nitrite: NEGATIVE
RBC / HPF: NONE SEEN (ref 0–?)
Specific Gravity, Urine: 1.01 (ref 1.000–1.030)
Total Protein, Urine: NEGATIVE
Urine Glucose: NEGATIVE
Urobilinogen, UA: 0.2 (ref 0.0–1.0)
pH: 6 (ref 5.0–8.0)

## 2019-10-02 LAB — CBC WITH DIFFERENTIAL/PLATELET
Basophils Absolute: 0.1 10*3/uL (ref 0.0–0.1)
Basophils Relative: 0.8 % (ref 0.0–3.0)
Eosinophils Absolute: 0.1 10*3/uL (ref 0.0–0.7)
Eosinophils Relative: 0.9 % (ref 0.0–5.0)
HCT: 36.1 % (ref 36.0–46.0)
Hemoglobin: 11.6 g/dL — ABNORMAL LOW (ref 12.0–15.0)
Lymphocytes Relative: 26.5 % (ref 12.0–46.0)
Lymphs Abs: 2.2 10*3/uL (ref 0.7–4.0)
MCHC: 32.1 g/dL (ref 30.0–36.0)
MCV: 91.1 fl (ref 78.0–100.0)
Monocytes Absolute: 0.8 10*3/uL (ref 0.1–1.0)
Monocytes Relative: 10 % (ref 3.0–12.0)
Neutro Abs: 5.1 10*3/uL (ref 1.4–7.7)
Neutrophils Relative %: 61.8 % (ref 43.0–77.0)
Platelets: 404 10*3/uL — ABNORMAL HIGH (ref 150.0–400.0)
RBC: 3.96 Mil/uL (ref 3.87–5.11)
RDW: 14.4 % (ref 11.5–15.5)
WBC: 8.3 10*3/uL (ref 4.0–10.5)

## 2019-10-02 LAB — IBC PANEL
Iron: 44 ug/dL (ref 42–145)
Saturation Ratios: 9.9 % — ABNORMAL LOW (ref 20.0–50.0)
Transferrin: 316 mg/dL (ref 212.0–360.0)

## 2019-10-02 LAB — BASIC METABOLIC PANEL
BUN: 18 mg/dL (ref 6–23)
CO2: 30 mEq/L (ref 19–32)
Calcium: 9.9 mg/dL (ref 8.4–10.5)
Chloride: 99 mEq/L (ref 96–112)
Creatinine, Ser: 1.18 mg/dL (ref 0.40–1.20)
GFR: 56.07 mL/min — ABNORMAL LOW (ref >60.00)
Glucose, Bld: 76 mg/dL (ref 70–99)
Potassium: 4.2 mEq/L (ref 3.5–5.1)
Sodium: 139 mEq/L (ref 135–145)

## 2019-10-02 LAB — MICROALBUMIN / CREATININE URINE RATIO
Creatinine,U: 97.7 mg/dL
Microalb Creat Ratio: 0.7 mg/g (ref 0.0–30.0)
Microalb, Ur: <0.7 mg/dL (ref 0.0–1.9)

## 2019-10-02 LAB — HEMOGLOBIN A1C: Hgb A1c MFr Bld: 5.8 % (ref 4.6–6.5)

## 2019-10-02 MED ORDER — TOLTERODINE TARTRATE ER 4 MG PO CP24: 4.0000 mg | ORAL_CAPSULE | Freq: Every day | ORAL | 3 refills | Status: DC

## 2019-10-02 MED ORDER — BUDESONIDE-FORMOTEROL FUMARATE 160-4.5 MCG/ACT IN AERO: 2.0000 | INHALATION_SPRAY | Freq: Two times a day (BID) | RESPIRATORY_TRACT | 6 refills | Status: DC

## 2019-10-02 MED ORDER — FLUCONAZOLE 150 MG PO TABS: ORAL_TABLET | ORAL | 1 refills | Status: DC

## 2019-10-02 MED ORDER — ALBUTEROL SULFATE HFA 108 (90 BASE) MCG/ACT IN AERS: 2.0000 | INHALATION_SPRAY | RESPIRATORY_TRACT | 11 refills | Status: DC | PRN

## 2019-10-02 MED ORDER — ALLOPURINOL 100 MG PO TABS: 200.0000 mg | ORAL_TABLET | Freq: Every day | ORAL | 3 refills | Status: DC

## 2019-10-02 NOTE — Progress Notes (Signed)
Subjective:    Patient ID: Suzanne Hansen, female    DOB: [birth year], 63 y.o.   MRN: [id number]  HPI Here to f/u; overall doing ok,  Pt denies chest pain, increasing sob or doe, wheezing, orthopnea, PND, increased LE swelling, palpitations, dizziness or syncope.  Pt denies new neurological symptoms such as new headache, or facial or extremity weakness or numbness.  Pt denies polydipsia, polyuria, or low sugar episode.  Pt states overall good compliance with meds, mostly trying to follow appropriate diet, with wt overall stable,  but little exercise however, as she is S/p lumbar surgury per Dr Saintclair Halsted, doing well, for PT and plans for f/u surgury early jan 2021. NSaids make her legs swell (has some swelling today, followed by card with increased fluid pill but proved too much with SBP 90 and dizzy, weakness)  Does have some near wetting accidents, maybe wants to restart the detrol LA again.  Takng the restasis for dry eyes since mar 2020.  Had screen mamogram, then diag mammogram neg.  Also had bursitis knee and hip better after cortisone to hip per ortho and knee, has not returned yet for the gel to the knee.  Due for renal and cardiology f/u and pt asks for referrals to [facility].  Also has 1 wk ongoing yeast like vaginitis, ask for tx.  Has not taken the high dose Vit D but now willing.  No recent gout symptoms and now worsening joint symptoms Past Medical History:  Diagnosis Date  . ALLERGIC RHINITIS 05/09/2008  . Allergy   . ANXIETY 05/23/2007  . ARM PAIN, LEFT 10/23/2009  . Arthritis   . Asthma   . ASTHMA 05/23/2007  . Back pain   . Cardiomyopathy ([place]) 09/08/2015  . Chest pain   . CHF (congestive heart failure) ([place])   . Constipation   . CONTACT DERMATITIS 06/09/2009  . Diabetes mellitus without complication ([place])    type II  . Dyspnea    allergies-dust, cig smoke, rag weeds  . ENDOMETRIOSIS NOS 05/23/2007  . FACIAL PAIN 06/02/2010  . Food allergy   . FREQUENCY, URINARY 05/17/2010  . GERD  (gastroesophageal reflux disease)   . GLUCOSE INTOLERANCE 08/28/2009  . HERPES SIMPLEX, UNCOMPLICATED [id number]  . Hyperlipidemia 08/28/2015  . HYPERTHYROIDISM 05/23/2007  . Hypoactive thyroid   . HYPOTHYROIDISM 05/09/2008  . Impaired glucose tolerance 01/28/2011  . INSOMNIA, HX OF 05/23/2007  . Joint pain   . LATERAL EPICONDYLITIS, RIGHT 03/10/2009  . LIPOMA 10/22/2010  . NECK PAIN 11/10/2010  . OBESITY 05/23/2007  . Osteopenia 09/28/2018  . Plantar fasciitis    Both feet  . Pre-diabetes   . SHOULDER PAIN, LEFT 05/17/2010  . SINUSITIS- ACUTE-NOS 11/03/2008  . SKIN LESION 11/10/2010  . Sleep apnea   . Stage III chronic kidney disease    Stage III  . Unspecified Chest Pain 07/25/2008  . UNSPECIFIED URTICARIA 06/04/2009  . URI 08/28/2009  . UTI 04/29/2008  . VITAMIN D DEFICIENCY 08/28/2009  . Wheezing 07/12/2010   Past Surgical History:  Procedure Laterality Date  . COLONOSCOPY    . ENDOMETRIAL ABLATION    . LAMINECTOMY  1996  . LAPAROTOMY     exploratory  . lower back surgery  07/06/2017   cyst  . POLYPECTOMY    . s/p endometrial ablation  12/06  . TUBAL LIGATION    . UTERINE FIBROID SURGERY    . WISDOM TOOTH EXTRACTION      reports that she quit smoking about 31 years  ago. Her smoking use included cigarettes. She has a 6.00 pack-year smoking history. She has never used smokeless tobacco. She reports that she does not drink alcohol or use drugs. family history includes COPD in her father; Cancer in her maternal grandmother; Depression in her mother; Diabetes in her cousin and sister; Heart disease in her cousin and mother; Hypertension in her cousin and father; Kidney disease in her sister. Allergies  Allergen Reactions  . Shellfish Allergy Anaphylaxis  . Clarithromycin Nausea And Vomiting  . Latex Itching and Other (See Comments)    ITCHING AND COLD SORES AROUND MOUTH WHEN LATEX GLOVES USED BY THE DENTIST/HYGENTIST  . Metronidazole Nausea And Vomiting  . Zostavax [Zoster Vaccine  Live]     Arm swelled  . Adhesive [Tape] Dermatitis    Plastic tape   . Lisinopril Cough  . Peanut Oil     Pt says it's ok   Current Outpatient Medications on File Prior to Visit  Medication Sig Dispense Refill  . aspirin 81 MG EC tablet Take 81 mg by mouth daily.      [name] atorvastatin (LIPITOR) 20 MG tablet Take 1 tablet (20 mg total) by mouth daily. 90 tablet 3  . buPROPion (WELLBUTRIN SR) 150 MG 12 hr tablet TAKE 1 TABLET(150 MG) BY MOUTH DAILY (Patient taking differently: Take 150 mg by mouth daily. ) 30 tablet 0  . Cholecalciferol (VITAMIN D) 50 MCG (2000 UT) CAPS Take 2,000 Units by mouth daily.     . Cyanocobalamin (VITAMIN B-12) 1000 MCG SUBL Place 1 tablet (1,000 mcg total) under the tongue daily.  0  . cyclobenzaprine (FLEXERIL) 10 MG tablet Take 1 tablet (10 mg total) by mouth at bedtime as needed for muscle spasms. 90 tablet 3  . fexofenadine (ALLEGRA) 180 MG tablet Take 180 mg by mouth daily.      . fluticasone (FLONASE) 50 MCG/ACT nasal spray Place 2 sprays into both nostrils daily. (Patient taking differently: Place 2 sprays into both nostrils daily as needed for allergies. ) 16 g 6  . gabapentin (NEURONTIN) 300 MG capsule TAKE 1 CAPSULE BY MOUTH THREE TIMES A DAY (Patient taking differently: Take 300 mg by mouth 3 (three) times daily. ) 90 capsule 1  . guaiFENesin (MUCINEX) 600 MG 12 hr tablet Take 2 tablets (1,200 mg total) by mouth 2 (two) times daily as needed for to loosen phlegm. 60 tablet 1  . KLOR-CON M20 20 MEQ tablet Take 40-60 mEq by mouth See admin instructions. 60 meq in the morning, 40 meq twice daily (midday, and bedtime)  3  . levothyroxine (SYNTHROID) 112 MCG tablet Take 1 tablet (112 mcg total) by mouth daily. (Patient taking differently: Take 112 mcg by mouth daily before breakfast. ) 90 tablet 3  . losartan (COZAAR) 25 MG tablet Take 12.5 mg by mouth daily.     . Menthol-Methyl Salicylate (MUSCLE RUB) 10-15 % CREA Apply 1 application topically as needed for  muscle pain.    . Multiple Vitamin (MULTIVITAMIN) capsule Take 1 capsule by mouth daily.      [name] omeprazole (PRILOSEC) 20 MG capsule Take 20 mg by mouth daily as needed (acid reflux).     . torsemide (DEMADEX) 20 MG tablet Take 40-60 mg by mouth See admin instructions. 60 mg in the morning and 40 mg evening time.    . traMADol (ULTRAM) 50 MG tablet Take 1 tablet (50 mg total) by mouth every 6 (six) hours as needed. (Patient taking differently: Take 50 mg by  mouth every 6 (six) hours as needed (pain). ) 30 tablet 0  . valACYclovir (VALTREX) 1000 MG tablet Take 500 mg by mouth daily as needed (cold sores).   0   No current facility-administered medications on file prior to visit.   Review of Systems  Constitutional: Negative for other unusual diaphoresis or sweats HENT: Negative for ear discharge or swelling Eyes: Negative for other worsening visual disturbances Respiratory: Negative for stridor or other swelling  Gastrointestinal: Negative for worsening distension or other blood Genitourinary: Negative for retention or other urinary change Musculoskeletal: Negative for other MSK pain or swelling Skin: Negative for color change or other new lesions Neurological: Negative for worsening tremors and other numbness  Psychiatric/Behavioral: Negative for worsening agitation or other fatigue All otherwise neg per pt     Objective:   Physical Exam BP 130/78 (BP Location: Left Arm)   Pulse 77   Temp 98.6 F (37 C) (Oral)   Wt 240 lb 9.6 oz (109.1 kg)   SpO2 95%   BMI 41.30 kg/m  VS noted,  Constitutional: Pt appears in NAD HENT: Head: NCAT.  Right Ear: External ear normal.  Left Ear: External ear normal.  Eyes: . Pupils are equal, round, and reactive to light. Conjunctivae and EOM are normal Nose: without d/c or deformity Neck: Neck supple. Gross normal ROM Cardiovascular: Normal rate and regular rhythm.   Pulmonary/Chest: Effort normal and breath sounds without rales or wheezing.    Abd:  Soft, NT, ND, + BS, no organomegaly Neurological: Pt is alert. At baseline orientation, motor grossly intact Skin: Skin is warm. No rashes, other new lesions, no LE edema Psychiatric: Pt behavior is normal without agitation  All otherwise neg per pt Lab Results  Component Value Date   WBC 8.3 10/02/2019   HGB 11.6 (L) 10/02/2019   HCT 36.1 10/02/2019   PLT 404.0 (H) 10/02/2019   GLUCOSE 76 10/02/2019   CHOL 132 10/02/2019   TRIG 75.0 10/02/2019   HDL 69.20 10/02/2019   LDLCALC 48 10/02/2019   ALT 18 10/02/2019   AST 17 10/02/2019   NA 139 10/02/2019   K 4.2 10/02/2019   CL 99 10/02/2019   CREATININE 1.18 10/02/2019   BUN 18 10/02/2019   CO2 30 10/02/2019   TSH 0.67 10/02/2019   INR 1.0 03/21/2017   HGBA1C 5.8 10/02/2019   MICROALBUR <0.7 10/02/2019      Assessment & Plan:

## 2019-10-02 NOTE — Patient Instructions (Signed)
Please take all new medication as prescribed - the detrol LA for bladder, and diflucan  You will be contacted regarding the referral for: [facility] - urology, nephrology, and cardiology  Please continue all other medications as before, and refills have been done if requested.  Please have the pharmacy call with any other refills you may need.  Please continue your efforts at being more active, low cholesterol diet, and weight control.  Please keep your appointments with your specialists as you may have planned  Please go to the LAB at the blood drawing area for the tests to be done  You will be contacted by phone if any changes need to be made immediately.  Otherwise, you will receive a letter about your results with an explanation, but please check with MyChart first.  Please remember to sign up for MyChart if you have not done so, as this will be important to you in the future with finding out test results, communicating by private email, and scheduling acute appointments online when needed.  Please return in 6 months, or sooner if needed

## 2019-10-03 LAB — VITAMIN B12: Vitamin B-12: 722 pg/mL (ref 211–911)

## 2019-10-03 LAB — VITAMIN D 25 HYDROXY (VIT D DEFICIENCY, FRACTURES): VITD: 28.06 ng/mL — ABNORMAL LOW (ref 30.00–100.00)

## 2019-10-03 LAB — T4, FREE: Free T4: 1.45 ng/dL (ref 0.60–1.60)

## 2019-10-03 LAB — TSH: TSH: 0.67 u[IU]/mL (ref 0.35–4.50)

## 2019-10-06 ENCOUNTER — Other Ambulatory Visit: Payer: Self-pay | Admitting: Internal Medicine

## 2019-10-06 MED ORDER — VITAMIN D (ERGOCALCIFEROL) 1.25 MG (50000 UNIT) PO CAPS: 50000.0000 [IU] | ORAL_CAPSULE | ORAL | 0 refills | Status: DC

## 2019-10-07 ENCOUNTER — Ambulatory Visit: Payer: Managed Care, Other (non HMO) | Admitting: Orthopaedic Surgery

## 2019-10-07 ENCOUNTER — Telehealth: Payer: Self-pay

## 2019-10-07 ENCOUNTER — Other Ambulatory Visit: Payer: Self-pay | Admitting: Orthopaedic Surgery

## 2019-10-07 ENCOUNTER — Other Ambulatory Visit: Payer: Self-pay

## 2019-10-07 NOTE — Progress Notes (Signed)
The patient was not seen in the office today. She will be rescheduled for a hyaluronic acid injection for her knee. There should be no charge today either since there was no official visit and she was not seen.

## 2019-10-07 NOTE — Progress Notes (Signed)
LVM regarding lab results 

## 2019-10-09 ENCOUNTER — Encounter: Payer: Managed Care, Other (non HMO) | Admitting: Internal Medicine

## 2019-10-12 ENCOUNTER — Encounter: Payer: Self-pay | Admitting: Internal Medicine

## 2019-10-12 DIAGNOSIS — B373 Candidiasis of vulva and vagina: Secondary | ICD-10-CM | POA: Insufficient documentation

## 2019-10-12 DIAGNOSIS — B3731 Acute candidiasis of vulva and vagina: Secondary | ICD-10-CM | POA: Insufficient documentation

## 2019-10-12 NOTE — Assessment & Plan Note (Signed)
stable overall by history and exam, recent data reviewed with pt, and pt to continue medical treatment as before,  to f/u any worsening symptoms or concerns, for [facility] referral per pt reqeust

## 2019-10-12 NOTE — Assessment & Plan Note (Signed)
Mild to mod, for diflucan asd, to f/u any worsening symptoms or concerns 

## 2019-10-12 NOTE — Assessment & Plan Note (Signed)
Reassured, ok to take the high dose Vit d weekly for 12 wks, then otc vit d3 2000 u per day

## 2019-10-12 NOTE — Assessment & Plan Note (Signed)
stable overall by history and exam, recent data reviewed with pt, and pt to continue medical treatment as before,  to f/u any worsening symptoms or concerns  

## 2019-10-12 NOTE — Assessment & Plan Note (Addendum)
stable overall by history and exam, recent data reviewed with pt, and pt to continue medical treatment as before,  to f/u any worsening symptoms or concerns, for [facility] cards referral  Note:  Total time for pt hx, exam, review of record with pt in the room, determination of diagnoses and plan for further eval and tx is > 40 min, with over 50% spent in coordination and counseling of patient including the differential dx, tx, further evaluation and other management of  Cardiomyopathy, OAB, hypothyroidism, CKD< DM, vit d deficiecy, yeast vaginitis

## 2019-10-12 NOTE — Assessment & Plan Note (Signed)
For restart detrol LA< refer [facility] per pt reqeust

## 2019-10-12 NOTE — Assessment & Plan Note (Signed)
For fu lab,  to f/u any worsening symptoms or concerns  

## 2019-10-14 DIAGNOSIS — M79662 Pain in left lower leg: Secondary | ICD-10-CM | POA: Diagnosis not present

## 2019-10-14 DIAGNOSIS — M7989 Other specified soft tissue disorders: Secondary | ICD-10-CM | POA: Diagnosis not present

## 2019-10-14 DIAGNOSIS — M48061 Spinal stenosis, lumbar region without neurogenic claudication: Secondary | ICD-10-CM | POA: Diagnosis not present

## 2019-10-14 DIAGNOSIS — M25462 Effusion, left knee: Secondary | ICD-10-CM | POA: Diagnosis not present

## 2019-10-18 ENCOUNTER — Telehealth: Payer: Self-pay | Admitting: *Deleted

## 2019-10-18 NOTE — Telephone Encounter (Signed)
This was sent on 10/14/19. See 10/02/19 referral notes.

## 2019-10-18 NOTE — Telephone Encounter (Signed)
Copied from [place] [id number]. Topic: Referral - Status >> Oct 17, 2019  2:20 PM Rainey Pines A wrote: Patient stated that [facility] needs referral (office notes,diagnosis, and concerns) faxed to [phone].

## 2019-10-24 ENCOUNTER — Other Ambulatory Visit: Payer: Self-pay

## 2019-10-24 ENCOUNTER — Encounter: Payer: Self-pay | Admitting: Family Medicine

## 2019-10-24 ENCOUNTER — Ambulatory Visit: Payer: BC Managed Care – PPO | Admitting: Family Medicine

## 2019-10-24 DIAGNOSIS — M170 Bilateral primary osteoarthritis of knee: Secondary | ICD-10-CM

## 2019-10-24 NOTE — Assessment & Plan Note (Signed)
Bilateral injections given today.  Tolerated the procedure well.  Topical anti-inflammatories given.  Discussed which activities to do which wants to avoid.  Patient could be a candidate for viscosupplementation again which we did do 2 years ago.  We will see if we can get approval.  Patient wants to avoid any surgical intervention at this time.  Follow-up 6 weeks

## 2019-10-24 NOTE — Patient Instructions (Addendum)
Will get you approved for gel injections See me again in 6 weeks

## 2019-10-24 NOTE — Progress Notes (Signed)
[place] Phone: [phone] Subjective:   [name], am serving as a scribe for Dr. Hulan Saas. This visit occurred during the SARS-CoV-2 public health emergency.  Safety protocols were in place, including screening questions prior to the visit, additional usage of staff PPE, and extensive cleaning of exam room while observing appropriate contact time as indicated for disinfecting solutions.     CC: Bilateral knee pain follow-up  [id number]   01/07/2019   Bilateral injections given.  Today.  Patient has responded fairly well for a year.  We discussed icing regimen and home exercise, proper shoes and over-the-counter orthotics.  Discussed which activities to do which wants to avoid.  Increase activity as tolerated.  Follow-up again in 4 to 8 weeks  Update 10/14/2019  Suzanne Hansen is a 64 y.o. female coming in with complaint of bilateral knee pain. Patient has had increase in knee pain following her back surgery that was on December 2nd.  Patient is having worsening pain.  Trying to increase activity.  Having difficulty with it at the moment.    Past Medical History:  Diagnosis Date  . ALLERGIC RHINITIS 05/09/2008  . Allergy   . ANXIETY 05/23/2007  . ARM PAIN, LEFT 10/23/2009  . Arthritis   . Asthma   . ASTHMA 05/23/2007  . Back pain   . Cardiomyopathy ([place]) 09/08/2015  . Chest pain   . CHF (congestive heart failure) ([place])   . Constipation   . CONTACT DERMATITIS 06/09/2009  . Diabetes mellitus without complication ([place])    type II  . Dyspnea    allergies-dust, cig smoke, rag weeds  . ENDOMETRIOSIS NOS 05/23/2007  . FACIAL PAIN 06/02/2010  . Food allergy   . FREQUENCY, URINARY 05/17/2010  . GERD (gastroesophageal reflux disease)   . GLUCOSE INTOLERANCE 08/28/2009  . HERPES SIMPLEX, UNCOMPLICATED [id number]  . Hyperlipidemia 08/28/2015  . HYPERTHYROIDISM 05/23/2007  . Hypoactive thyroid   .  HYPOTHYROIDISM 05/09/2008  . Impaired glucose tolerance 01/28/2011  . INSOMNIA, HX OF 05/23/2007  . Joint pain   . LATERAL EPICONDYLITIS, RIGHT 03/10/2009  . LIPOMA 10/22/2010  . NECK PAIN 11/10/2010  . OBESITY 05/23/2007  . Osteopenia 09/28/2018  . Plantar fasciitis    Both feet  . Pre-diabetes   . SHOULDER PAIN, LEFT 05/17/2010  . SINUSITIS- ACUTE-NOS 11/03/2008  . SKIN LESION 11/10/2010  . Sleep apnea   . Stage III chronic kidney disease    Stage III  . Unspecified Chest Pain 07/25/2008  . UNSPECIFIED URTICARIA 06/04/2009  . URI 08/28/2009  . UTI 04/29/2008  . VITAMIN D DEFICIENCY 08/28/2009  . Wheezing 07/12/2010   Past Surgical History:  Procedure Laterality Date  . COLONOSCOPY    . ENDOMETRIAL ABLATION    . LAMINECTOMY  1996  . LAPAROTOMY     exploratory  . lower back surgery  07/06/2017   cyst  . POLYPECTOMY    . s/p endometrial ablation  12/06  . TUBAL LIGATION    . UTERINE FIBROID SURGERY    . WISDOM TOOTH EXTRACTION     Social History   Socioeconomic History  . Marital status: Divorced    Spouse name: Not on file  . Number of children: 3  . Years of education: Not on file  . Highest education level: Not on file  Occupational History  . Occupation: SOCIAL WORKER    Employer: [employer]    Comment: [place]  Tobacco  Use  . Smoking status: Former Smoker    Packs/day: 0.50    Years: 12.00    Pack years: 6.00    Types: Cigarettes    Quit date: 11/05/1987    Years since quitting: 31.9  . Smokeless tobacco: Never Used  Substance and Sexual Activity  . Alcohol use: No    Alcohol/week: 1.0 standard drinks    Types: 1 Glasses of wine per week    Comment: WINE  . Drug use: No  . Sexual activity: Not on file  Other Topics Concern  . Not on file  Social History Narrative  . Not on file   Social Determinants of Health   Financial Resource Strain:   . Difficulty of Paying Living Expenses: Not on file  Food Insecurity:   . Worried About Paediatric nurse in the Last Year: Not on file  . Ran Out of Food in the Last Year: Not on file  Transportation Needs:   . Lack of Transportation (Medical): Not on file  . Lack of Transportation (Non-Medical): Not on file  Physical Activity:   . Days of Exercise per Week: Not on file  . Minutes of Exercise per Session: Not on file  Stress:   . Feeling of Stress : Not on file  Social Connections:   . Frequency of Communication with Friends and Family: Not on file  . Frequency of Social Gatherings with Friends and Family: Not on file  . Attends Religious Services: Not on file  . Active Member of Clubs or Organizations: Not on file  . Attends Archivist Meetings: Not on file  . Marital Status: Not on file   Allergies  Allergen Reactions  . Shellfish Allergy Anaphylaxis  . Clarithromycin Nausea And Vomiting  . Latex Itching and Other (See Comments)    ITCHING AND COLD SORES AROUND MOUTH WHEN LATEX GLOVES USED BY THE DENTIST/HYGENTIST  . Metronidazole Nausea And Vomiting  . Zostavax [Zoster Vaccine Live]     Arm swelled  . Adhesive [Tape] Dermatitis    Plastic tape   . Lisinopril Cough  . Peanut Oil     Pt says it's ok   Family History  Problem Relation Age of Onset  . Diabetes Cousin   . Hypertension Cousin   . Heart disease Cousin   . Heart disease Mother        Rheumatic heart disease  . Depression Mother   . COPD Father   . Hypertension Father   . Cancer Maternal Grandmother        Breast  . Diabetes Sister   . Kidney disease Sister     Current Outpatient Medications (Endocrine & Metabolic):  .  levothyroxine (SYNTHROID) 112 MCG tablet, Take 1 tablet (112 mcg total) by mouth daily. (Patient taking differently: Take 112 mcg by mouth daily before breakfast. )  Current Outpatient Medications (Cardiovascular):  .  atorvastatin (LIPITOR) 20 MG tablet, Take 1 tablet (20 mg total) by mouth daily. [name]  losartan (COZAAR) 25 MG tablet, Take 12.5 mg by mouth daily.  [name]   torsemide (DEMADEX) 20 MG tablet, Take 40-60 mg by mouth See admin instructions. 60 mg in the morning and 40 mg evening time.  Current Outpatient Medications (Respiratory):  .  albuterol (VENTOLIN HFA) 108 (90 Base) MCG/ACT inhaler, Inhale 2 puffs into the lungs every 4 (four) hours as needed for wheezing or shortness of breath. .  budesonide-formoterol (SYMBICORT) 160-4.5 MCG/ACT inhaler, Inhale 2 puffs into the lungs  2 (two) times daily. .  fexofenadine (ALLEGRA) 180 MG tablet, Take 180 mg by mouth daily.   .  fluticasone (FLONASE) 50 MCG/ACT nasal spray, Place 2 sprays into both nostrils daily. (Patient taking differently: Place 2 sprays into both nostrils daily as needed for allergies. ) .  guaiFENesin (MUCINEX) 600 MG 12 hr tablet, Take 2 tablets (1,200 mg total) by mouth 2 (two) times daily as needed for to loosen phlegm.  Current Outpatient Medications (Analgesics):  .  allopurinol (ZYLOPRIM) 100 MG tablet, Take 2 tablets (200 mg total) by mouth daily. Take 200 mg by mouth daily. [name]  aspirin 81 MG EC tablet, Take 81 mg by mouth daily.   .  traMADol (ULTRAM) 50 MG tablet, Take 1 tablet (50 mg total) by mouth every 6 (six) hours as needed. (Patient taking differently: Take 50 mg by mouth every 6 (six) hours as needed (pain). )  Current Outpatient Medications (Hematological):  [name]  Cyanocobalamin (VITAMIN B-12) 1000 MCG SUBL, Place 1 tablet (1,000 mcg total) under the tongue daily.  Current Outpatient Medications (Other):  [name]  buPROPion (WELLBUTRIN SR) 150 MG 12 hr tablet, TAKE 1 TABLET(150 MG) BY MOUTH DAILY (Patient taking differently: Take 150 mg by mouth daily. ) .  Cholecalciferol (VITAMIN D) 50 MCG (2000 UT) CAPS, Take 2,000 Units by mouth daily.  .  cyclobenzaprine (FLEXERIL) 10 MG tablet, Take 1 tablet (10 mg total) by mouth at bedtime as needed for muscle spasms. .  fluconazole (DIFLUCAN) 150 MG tablet, 1 tab by mouth every 3 days as needed .  gabapentin (NEURONTIN) 300 MG capsule,  TAKE 1 CAPSULE BY MOUTH THREE TIMES A DAY (Patient taking differently: Take 300 mg by mouth 3 (three) times daily. ) .  KLOR-CON M20 20 MEQ tablet, Take 40-60 mEq by mouth See admin instructions. 60 meq in the morning, 40 meq twice daily (midday, and bedtime) .  Menthol-Methyl Salicylate (MUSCLE RUB) 10-15 % CREA, Apply 1 application topically as needed for muscle pain. .  Multiple Vitamin (MULTIVITAMIN) capsule, Take 1 capsule by mouth daily.   [name]  omeprazole (PRILOSEC) 20 MG capsule, Take 20 mg by mouth daily as needed (acid reflux).  .  tolterodine (DETROL LA) 4 MG 24 hr capsule, Take 1 capsule (4 mg total) by mouth daily. .  valACYclovir (VALTREX) 1000 MG tablet, Take 500 mg by mouth daily as needed (cold sores).  .  Vitamin D, Ergocalciferol, (DRISDOL) 1.25 MG (50000 UT) CAPS capsule, Take 1 capsule (50,000 Units total) by mouth every 7 (seven) days.    Past medical history, social, surgical and family history all reviewed in electronic medical record.  No pertanent information unless stated regarding to the chief complaint.   Review of Systems:  No headache, visual changes, nausea, vomiting, diarrhea, constipation, dizziness, abdominal pain, skin rash, fevers, chills, night sweats, weight loss, swollen lymph nodes, body aches, , chest pain, shortness of breath, mood changes.  Positive muscle aches and joint swelling  Objective  Blood pressure 130/82, height 5\' 4"  (1.626 m), weight 240 lb (108.9 kg).    General: No apparent distress alert and oriented x3 mood and affect normal, dressed appropriately.  HEENT: Pupils equal, extraocular movements intact  Respiratory: Patient's speak in full sentences and does not appear short of breath  Cardiovascular: 1+ lower extremity edema, non tender, no erythema  Skin: Warm dry intact with no signs of infection or rash on extremities or on axial skeleton.  Abdomen: Soft nontender  Neuro: Cranial nerves II  through XII are intact, neurovascularly  intact in all extremities with 2+ DTRs and 2+ pulses.  Lymph: No lymphadenopathy of posterior or anterior cervical chain or axillae bilaterally.  Gait using a walker MSK:  tender with limited range of motion and stability and symmetric strength and tone of shoulders, elbows, wrist,  bilaterally.  Knee: Bilateral valgus deformity noted. Large thigh to calf ratio.  Tender to palpation over medial and PF joint line.  ROM full in flexion and extension and lower leg rotation. instability with valgus force.  painful patellar compression. Patellar glide with moderate crepitus. Patellar and quadriceps tendons unremarkable. Hamstring and quadriceps strength is normal.   After informed written and verbal consent, patient was seated on exam table. Right knee was prepped with alcohol swab and utilizing anterolateral approach, patient's right knee space was injected with 4:1  marcaine 0.5%: Kenalog 40mg /dL. Patient tolerated the procedure well without immediate complications.  After informed written and verbal consent, patient was seated on exam table. Left knee was prepped with alcohol swab and utilizing anterolateral approach, patient's left knee space was injected with 4:1  marcaine 0.5%: Kenalog 40mg /dL. Patient tolerated the procedure well without immediate complications.   Impression and Recommendations:     This case required medical decision making of moderate complexity. The above documentation has been reviewed and is accurate and complete Lyndal Pulley, DO       Note: This dictation was prepared with Dragon dictation along with smaller phrase technology. Any transcriptional errors that result from this process are unintentional.

## 2019-10-29 ENCOUNTER — Encounter (INDEPENDENT_AMBULATORY_CARE_PROVIDER_SITE_OTHER): Payer: Self-pay

## 2019-10-31 ENCOUNTER — Other Ambulatory Visit: Payer: Self-pay

## 2019-10-31 ENCOUNTER — Ambulatory Visit (INDEPENDENT_AMBULATORY_CARE_PROVIDER_SITE_OTHER): Payer: BC Managed Care – PPO | Admitting: Bariatrics

## 2019-10-31 VITALS — BP 102/65 | HR 97 | Temp 98.2°F | Ht 65.0 in | Wt 233.0 lb

## 2019-10-31 DIAGNOSIS — R7303 Prediabetes: Secondary | ICD-10-CM | POA: Diagnosis not present

## 2019-10-31 DIAGNOSIS — Z6841 Body Mass Index (BMI) 40.0 and over, adult: Secondary | ICD-10-CM

## 2019-10-31 DIAGNOSIS — F3289 Other specified depressive episodes: Secondary | ICD-10-CM | POA: Diagnosis not present

## 2019-10-31 DIAGNOSIS — E7849 Other hyperlipidemia: Secondary | ICD-10-CM

## 2019-10-31 DIAGNOSIS — Z9189 Other specified personal risk factors, not elsewhere classified: Secondary | ICD-10-CM

## 2019-10-31 MED ORDER — BUPROPION HCL ER (SR) 150 MG PO TB12: 150.0000 mg | ORAL_TABLET | Freq: Every day | ORAL | 0 refills | Status: DC

## 2019-11-04 NOTE — Progress Notes (Signed)
Chief Complaint:   OBESITY Suzanne Hansen is here to discuss her progress with her obesity treatment plan along with follow-up of her obesity related diagnoses. Suzanne Hansen is on following a lower carbohydrate, vegetable and lean protein rich diet plan and states she is following her eating plan approximately 0% of the time. Suzanne Hansen states she is doing 0 minutes 0 times per week.  Today's visit was #: 12 Starting weight: 241 lbs Starting date: 08/09/18 Today's weight: 233 lbs Today's date: 10/31/2019 Total lbs lost to date: 8 Total lbs lost since last in-office visit: 0  Interim History: Suzanne Hansen is up 4 lbs, but she has not been to our clinic since 07/18/2019 for an in office visit. She has had back fusion surgery since her last visit. She is having some cravings.  Subjective:   1. Other hyperlipidemia Suzanne Hansen is taking Lipitor.  2. Pre-diabetes Suzanne Hansen is no on medications, and she notes her appetite is normal. Last A1c was 5.8.  3. Other depression, with emotional eating Suzanne Hansen notes cravings in the evenings.  4. At risk for hypoglycemia Suzanne Hansen is at increased risk for hypoglycemia due to changes in diet, diagnosis of pre-diabetes, and/or insulin use. Suzanne Hansen is not currently taking insulin.   Assessment/Plan:   1. Other hyperlipidemia Cardiovascular risk and specific lipid/LDL goals reviewed. Suzanne Hansen will continue her medications. We discussed several lifestyle modifications today and Suzanne Hansen will continue to work on diet, exercise and weight loss efforts, decrease saturated fats and increase MUFA's and PUFA's. Orders and follow up as documented in patient record.   Counseling Intensive lifestyle modifications are the first line treatment for this issue. . Dietary changes: Increase soluble fiber. Decrease simple carbohydrates. . Exercise changes: Moderate to vigorous-intensity aerobic activity 150 minutes per week if tolerated. . Lipid-lowering medications: see  documented in medical record.  2. Pre-diabetes Suzanne Hansen will continue to work on weight loss, and will try to be as active as possible, increase healthy protein and fats, and decreasing simple carbohydrates to help decrease the risk of diabetes.   3. Other depression, with emotional eating Behavior modification techniques were discussed today to help Suzanne Hansen deal with her emotional/non-hunger eating behaviors. We will refill Wellbutrin for 1 months. Orders and follow up as documented in patient record.   - buPROPion (WELLBUTRIN SR) 150 MG 12 hr tablet; Take 1 tablet (150 mg total) by mouth daily.  Dispense: 30 tablet; Refill: 0  4. At risk for hypoglycemia Suzanne Hansen was given approximately 15 minutes of counseling today regarding prevention of hypoglycemia. She was advised of symptoms of hypoglycemia. Suzanne Hansen was instructed to eat regular meals.   5. Class 3 severe obesity with serious comorbidity and body mass index (BMI) of 40.0 to 44.9 in adult, unspecified obesity type (Suzanne Hansen) Suzanne Hansen is currently in the action stage of change. As such, her goal is to continue with weight loss efforts. She has agreed to the Category 3 Plan.   Exercise goals: For substantial health benefits, adults should do at least 150 minutes (2 hours and 30 minutes) a week of moderate-intensity, or 75 minutes (1 hour and 15 minutes) a week of vigorous-intensity aerobic physical activity, or an equivalent combination of moderate- and vigorous-intensity aerobic activity. Aerobic activity should be performed in episodes of at least 10 minutes, and preferably, it should be spread throughout the week. Adults should also include muscle-strengthening activities that involve all major muscle groups on 2 or more days a week.  Behavioral modification strategies: increasing lean protein intake, decreasing simple carbohydrates,  increasing vegetables, increasing water intake, decreasing eating out, no skipping meals, meal planning and  cooking strategies, keeping healthy foods in the home and planning for success.  Suzanne Hansen has agreed to follow-up with our clinic in 2 weeks. She was informed of the importance of frequent follow-up visits to maximize her success with intensive lifestyle modifications for her multiple health conditions.   Objective:   Blood pressure 102/65, pulse 97, temperature 98.2 F (36.8 C), height 5\' 5"  (1.651 m), weight 233 lb (105.7 kg), SpO2 98 %. Body mass index is 38.77 kg/m.  General: Cooperative, alert, well developed, in no acute distress. HEENT: Conjunctivae and lids unremarkable. Cardiovascular: Regular rhythm.  Lungs: Normal work of breathing. Neurologic: No focal deficits.   Lab Results  Component Value Date   CREATININE 1.18 10/02/2019   BUN 18 10/02/2019   NA 139 10/02/2019   K 4.2 10/02/2019   CL 99 10/02/2019   CO2 30 10/02/2019   Lab Results  Component Value Date   ALT 18 10/02/2019   AST 17 10/02/2019   ALKPHOS 164 (H) 10/02/2019   BILITOT 0.5 10/02/2019   Lab Results  Component Value Date   HGBA1C 5.8 10/02/2019   HGBA1C 5.7 (H) 07/18/2019   HGBA1C 5.6 09/28/2018   HGBA1C 5.6 08/09/2018   HGBA1C 5.7 (A) 05/04/2018   Lab Results  Component Value Date   INSULIN 20.7 07/18/2019   INSULIN 17.9 08/09/2018   Lab Results  Component Value Date   TSH 0.67 10/02/2019   Lab Results  Component Value Date   CHOL 132 10/02/2019   HDL 69.20 10/02/2019   LDLCALC 48 10/02/2019   TRIG 75.0 10/02/2019   CHOLHDL 2 10/02/2019   Lab Results  Component Value Date   WBC 8.3 10/02/2019   HGB 11.6 (L) 10/02/2019   HCT 36.1 10/02/2019   MCV 91.1 10/02/2019   PLT 404.0 (H) 10/02/2019   Lab Results  Component Value Date   IRON 44 10/02/2019   Attestation Statements:   Reviewed by clinician on day of visit: allergies, medications, problem list, medical history, surgical history, family history, social history, and previous encounter notes.   Wilhemena Durie, am  acting as Location manager for [employer], DO.  I have reviewed the above documentation for accuracy and completeness, and I agree with the above. Jearld Lesch, DO

## 2019-11-05 ENCOUNTER — Encounter (INDEPENDENT_AMBULATORY_CARE_PROVIDER_SITE_OTHER): Payer: Self-pay | Admitting: Bariatrics

## 2019-11-12 DIAGNOSIS — M47816 Spondylosis without myelopathy or radiculopathy, lumbar region: Secondary | ICD-10-CM | POA: Diagnosis not present

## 2019-11-13 ENCOUNTER — Telehealth: Payer: Self-pay

## 2019-11-13 NOTE — Telephone Encounter (Signed)
Sent patient message via MyChart and left voicemail.

## 2019-11-13 NOTE — Telephone Encounter (Signed)
Patient called back and relayed what Mateo Flow had sent in patients Mychart and patient will contact [facility]

## 2019-11-13 NOTE — Telephone Encounter (Signed)
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 Asked if we did PT here and I stated no and wanted to get a call back from Dr. Thompson Caul assistance to ask about PT.

## 2019-11-14 ENCOUNTER — Telehealth: Payer: Self-pay | Admitting: Internal Medicine

## 2019-11-14 NOTE — Telephone Encounter (Signed)
Please call pt to discuss her Vit D meds thanks

## 2019-11-19 NOTE — Telephone Encounter (Signed)
Pt wanted clarification on what to do after finishing prescribed vit D medication. Informed her to proceed with OTC vit D# 2000u as recommended by PCP.

## 2019-11-20 DIAGNOSIS — R2689 Other abnormalities of gait and mobility: Secondary | ICD-10-CM | POA: Diagnosis not present

## 2019-11-20 DIAGNOSIS — M545 Low back pain: Secondary | ICD-10-CM | POA: Diagnosis not present

## 2019-11-21 ENCOUNTER — Ambulatory Visit (INDEPENDENT_AMBULATORY_CARE_PROVIDER_SITE_OTHER): Payer: BC Managed Care – PPO | Admitting: Bariatrics

## 2019-11-22 DIAGNOSIS — M545 Low back pain: Secondary | ICD-10-CM | POA: Diagnosis not present

## 2019-11-22 DIAGNOSIS — R2689 Other abnormalities of gait and mobility: Secondary | ICD-10-CM | POA: Diagnosis not present

## 2019-11-25 DIAGNOSIS — M545 Low back pain: Secondary | ICD-10-CM | POA: Diagnosis not present

## 2019-11-25 DIAGNOSIS — R2689 Other abnormalities of gait and mobility: Secondary | ICD-10-CM | POA: Diagnosis not present

## 2019-11-27 DIAGNOSIS — R2689 Other abnormalities of gait and mobility: Secondary | ICD-10-CM | POA: Diagnosis not present

## 2019-11-27 DIAGNOSIS — M545 Low back pain: Secondary | ICD-10-CM | POA: Diagnosis not present

## 2019-11-29 ENCOUNTER — Other Ambulatory Visit (INDEPENDENT_AMBULATORY_CARE_PROVIDER_SITE_OTHER): Payer: Self-pay | Admitting: Bariatrics

## 2019-11-29 DIAGNOSIS — F3289 Other specified depressive episodes: Secondary | ICD-10-CM

## 2019-12-05 ENCOUNTER — Other Ambulatory Visit: Payer: Self-pay

## 2019-12-05 ENCOUNTER — Ambulatory Visit: Payer: BC Managed Care – PPO | Admitting: Family Medicine

## 2019-12-05 DIAGNOSIS — M170 Bilateral primary osteoarthritis of knee: Secondary | ICD-10-CM | POA: Diagnosis not present

## 2019-12-05 MED ORDER — DICLOFENAC SODIUM 2 % EX SOLN: 2.0000 g | Freq: Two times a day (BID) | CUTANEOUS | 3 refills | Status: DC

## 2019-12-05 MED ORDER — DULOXETINE HCL 20 MG PO CPEP: 20.0000 mg | ORAL_CAPSULE | Freq: Every day | ORAL | 3 refills | Status: DC

## 2019-12-05 NOTE — Progress Notes (Signed)
[place] Phone: [phone] Subjective:   Suzanne Hansen, am serving as a scribe for Dr. Hulan Saas. This visit occurred during the SARS-CoV-2 public health emergency.  Safety protocols were in place, including screening questions prior to the visit, additional usage of staff PPE, and extensive cleaning of exam room while observing appropriate contact time as indicated for disinfecting solutions.    I'm seeing this patient by the request  of:  Biagio Borg, MD  CC: Bilateral knee pain  [id number]   10/24/2019 Bilateral injections given today.  Tolerated the procedure well.  Topical anti-inflammatories given.  Discussed which activities to do which wants to avoid.  Patient could be a candidate for viscosupplementation again which we did do 2 years ago.  We will see if we can get approval.  Patient wants to avoid any surgical intervention at this time.  Follow-up 6 weeks  Update 12/05/2019 Suzanne Hansen is a 64 y.o. female coming in with complaint of bilateral knee pain. Patient states that she in pain all the time especially with deep knee bending. Is doing PT which is helping. Does feel injections did provide her with some relief.  Patient though is looking if there is anything else that she can do.  Is seeing Dr. Weston Settle for her back pain. Is going to work light duty and get a CT scan to determine next steps.      Past Medical History:  Diagnosis Date  . ALLERGIC RHINITIS 05/09/2008  . Allergy   . ANXIETY 05/23/2007  . ARM PAIN, LEFT 10/23/2009  . Arthritis   . Asthma   . ASTHMA 05/23/2007  . Back pain   . Cardiomyopathy ([place]) 09/08/2015  . Chest pain   . CHF (congestive heart failure) ([place])   . Constipation   . CONTACT DERMATITIS 06/09/2009  . Diabetes mellitus without complication ([place])    type II  . Dyspnea    allergies-dust, cig smoke, rag weeds  . ENDOMETRIOSIS NOS 05/23/2007  . FACIAL PAIN  06/02/2010  . Food allergy   . FREQUENCY, URINARY 05/17/2010  . GERD (gastroesophageal reflux disease)   . GLUCOSE INTOLERANCE 08/28/2009  . HERPES SIMPLEX, UNCOMPLICATED [id number]  . Hyperlipidemia 08/28/2015  . HYPERTHYROIDISM 05/23/2007  . Hypoactive thyroid   . HYPOTHYROIDISM 05/09/2008  . Impaired glucose tolerance 01/28/2011  . INSOMNIA, HX OF 05/23/2007  . Joint pain   . LATERAL EPICONDYLITIS, RIGHT 03/10/2009  . LIPOMA 10/22/2010  . NECK PAIN 11/10/2010  . OBESITY 05/23/2007  . Osteopenia 09/28/2018  . Plantar fasciitis    Both feet  . Pre-diabetes   . SHOULDER PAIN, LEFT 05/17/2010  . SINUSITIS- ACUTE-NOS 11/03/2008  . SKIN LESION 11/10/2010  . Sleep apnea   . Stage III chronic kidney disease    Stage III  . Unspecified Chest Pain 07/25/2008  . UNSPECIFIED URTICARIA 06/04/2009  . URI 08/28/2009  . UTI 04/29/2008  . VITAMIN D DEFICIENCY 08/28/2009  . Wheezing 07/12/2010   Past Surgical History:  Procedure Laterality Date  . COLONOSCOPY    . ENDOMETRIAL ABLATION    . LAMINECTOMY  1996  . LAPAROTOMY     exploratory  . lower back surgery  07/06/2017   cyst  . POLYPECTOMY    . s/p endometrial ablation  12/06  . TUBAL LIGATION    . UTERINE FIBROID SURGERY    . WISDOM TOOTH EXTRACTION     Social History   Socioeconomic History  .  Marital status: Divorced    Spouse name: Not on file  . Number of children: 3  . Years of education: Not on file  . Highest education level: Not on file  Occupational History  . Occupation: SOCIAL WORKER    Employer: [employer]    Comment: [place]  Tobacco Use  . Smoking status: Former Smoker    Packs/day: 0.50    Years: 12.00    Pack years: 6.00    Types: Cigarettes    Quit date: 11/05/1987    Years since quitting: 32.1  . Smokeless tobacco: Never Used  Substance and Sexual Activity  . Alcohol use: Hansen    Alcohol/week: 1.0 standard drinks    Types: 1 Glasses of wine per week    Comment: WINE  . Drug use: Hansen  . Sexual  activity: Not on file  Other Topics Concern  . Not on file  Social History Narrative  . Not on file   Social Determinants of Health   Financial Resource Strain:   . Difficulty of Paying Living Expenses: Not on file  Food Insecurity:   . Worried About Charity fundraiser in the Last Year: Not on file  . Ran Out of Food in the Last Year: Not on file  Transportation Needs:   . Lack of Transportation (Medical): Not on file  . Lack of Transportation (Non-Medical): Not on file  Physical Activity:   . Days of Exercise per Week: Not on file  . Minutes of Exercise per Session: Not on file  Stress:   . Feeling of Stress : Not on file  Social Connections:   . Frequency of Communication with Friends and Family: Not on file  . Frequency of Social Gatherings with Friends and Family: Not on file  . Attends Religious Services: Not on file  . Active Member of Clubs or Organizations: Not on file  . Attends Archivist Meetings: Not on file  . Marital Status: Not on file   Allergies  Allergen Reactions  . Shellfish Allergy Anaphylaxis  . Clarithromycin Nausea And Vomiting  . Latex Itching and Other (See Comments)    ITCHING AND COLD SORES AROUND MOUTH WHEN LATEX GLOVES USED BY THE DENTIST/HYGENTIST  . Metronidazole Nausea And Vomiting  . Zostavax [Zoster Vaccine Live]     Arm swelled  . Adhesive [Tape] Dermatitis    Plastic tape   . Lisinopril Cough  . Peanut Oil     Pt says it's ok   Family History  Problem Relation Age of Onset  . Diabetes Cousin   . Hypertension Cousin   . Heart disease Cousin   . Heart disease Mother        Rheumatic heart disease  . Depression Mother   . COPD Father   . Hypertension Father   . Cancer Maternal Grandmother        Breast  . Diabetes Sister   . Kidney disease Sister     Current Outpatient Medications (Endocrine & Metabolic):  .  levothyroxine (SYNTHROID) 112 MCG tablet, Take 1 tablet (112 mcg total) by mouth daily. (Patient taking  differently: Take 112 mcg by mouth daily before breakfast. )  Current Outpatient Medications (Cardiovascular):  .  atorvastatin (LIPITOR) 20 MG tablet, Take 1 tablet (20 mg total) by mouth daily. [name]  losartan (COZAAR) 25 MG tablet, Take 12.5 mg by mouth daily.  [name]  torsemide (DEMADEX) 20 MG tablet, Take 40-60 mg by mouth See admin instructions. [place]  mg in the morning and 40 mg evening time.  Current Outpatient Medications (Respiratory):  .  albuterol (VENTOLIN HFA) 108 (90 Base) MCG/ACT inhaler, Inhale 2 puffs into the lungs every 4 (four) hours as needed for wheezing or shortness of breath. .  budesonide-formoterol (SYMBICORT) 160-4.5 MCG/ACT inhaler, Inhale 2 puffs into the lungs 2 (two) times daily. .  fexofenadine (ALLEGRA) 180 MG tablet, Take 180 mg by mouth daily.   .  fluticasone (FLONASE) 50 MCG/ACT nasal spray, Place 2 sprays into both nostrils daily. (Patient taking differently: Place 2 sprays into both nostrils daily as needed for allergies. ) .  guaiFENesin (MUCINEX) 600 MG 12 hr tablet, Take 2 tablets (1,200 mg total) by mouth 2 (two) times daily as needed for to loosen phlegm.  Current Outpatient Medications (Analgesics):  .  allopurinol (ZYLOPRIM) 100 MG tablet, Take 2 tablets (200 mg total) by mouth daily. Take 200 mg by mouth daily. [name]  aspirin 81 MG EC tablet, Take 81 mg by mouth daily.   .  traMADol (ULTRAM) 50 MG tablet, Take 1 tablet (50 mg total) by mouth every 6 (six) hours as needed. (Patient taking differently: Take 50 mg by mouth every 6 (six) hours as needed (pain). )   Current Outpatient Medications (Other):  [name]  buPROPion (WELLBUTRIN SR) 150 MG 12 hr tablet, Take 1 tablet (150 mg total) by mouth daily. .  cyclobenzaprine (FLEXERIL) 10 MG tablet, Take 1 tablet (10 mg total) by mouth at bedtime as needed for muscle spasms. .  fluconazole (DIFLUCAN) 150 MG tablet, 1 tab by mouth every 3 days as needed .  gabapentin (NEURONTIN) 300 MG capsule, TAKE 1 CAPSULE BY MOUTH THREE  TIMES A DAY (Patient taking differently: Take 300 mg by mouth 3 (three) times daily. ) .  KLOR-CON M20 20 MEQ tablet, Take 40-60 mEq by mouth See admin instructions. 60 meq in the morning, 40 meq twice daily (midday, and bedtime) .  Menthol-Methyl Salicylate (MUSCLE RUB) 10-15 % CREA, Apply 1 application topically as needed for muscle pain. .  Multiple Vitamin (MULTIVITAMIN) capsule, Take 1 capsule by mouth daily.   [name]  omeprazole (PRILOSEC) 20 MG capsule, Take 20 mg by mouth daily as needed (acid reflux).  .  tolterodine (DETROL LA) 4 MG 24 hr capsule, Take 1 capsule (4 mg total) by mouth daily. .  valACYclovir (VALTREX) 1000 MG tablet, Take 500 mg by mouth daily as needed (cold sores).  .  Diclofenac Sodium 2 % SOLN, Place 2 g onto the skin 2 (two) times daily. .  DULoxetine (CYMBALTA) 20 MG capsule, Take 1 capsule (20 mg total) by mouth daily.   Reviewed prior external information including notes and imaging from  primary care provider As well as notes that were available from care everywhere and other healthcare systems.  Past medical history, social, surgical and family history all reviewed in electronic medical record.  Hansen pertanent information unless stated regarding to the chief complaint.   Review of Systems:  Hansen headache, visual changes, nausea, vomiting, diarrhea, constipation, dizziness, abdominal pain, skin rash, fevers, chills, night sweats, weight loss, swollen lymph nodes, body aches, joint swelling, chest pain, shortness of breath, mood changes. POSITIVE muscle aches  Objective  Blood pressure 122/86, pulse 93, height 5\' 5"  (1.651 m), weight 244 lb (110.7 kg), SpO2 98 %.   General: Hansen apparent distress alert and oriented x3 mood and affect normal, dressed appropriately.  HEENT: Pupils equal, extraocular movements intact  Respiratory: Patient's speak in full  sentences and does not appear short of breath  Cardiovascular: Hansen lower extremity edema, non tender, Hansen erythema    Skin: Warm dry intact with Hansen signs of infection or rash on extremities or on axial skeleton.  Abdomen: Soft nontender  Neuro: Cranial nerves II through XII are intact, neurovascularly intact in all extremities with 2+ DTRs and 2+ pulses.  Lymph: Hansen lymphadenopathy of posterior or anterior cervical chain or axillae bilaterally.  Gait antalgic MSK:   Knee: Bilateral valgus deformity noted. Large thigh to calf ratio.  Tender to palpation over medial and PF joint line.  ROM full in flexion and extension and lower leg rotation. instability with valgus force.  painful patellar compression. Patellar glide with moderate crepitus. Patellar and quadriceps tendons unremarkable. Hamstring and quadriceps strength is normal.  After informed written and verbal consent, patient was seated on exam table. Right knee was prepped with alcohol swab and utilizing anterolateral approach, patient's right knee space was injected with 6 cc of 89mL/ 6 cc of Synvisc 1. Patient tolerated the procedure well without immediate complications.  After informed written and verbal consent, patient was seated on exam table. Left knee was prepped with alcohol swab and utilizing anterolateral approach, patient's left knee space was injected with 6 cc of 48 mL per 6 cc of Synvisc 1. Patient tolerated the procedure well without immediate complications.    Impression and Recommendations:     This case required medical decision making of moderate complexity. The above documentation has been reviewed and is accurate and complete Lyndal Pulley, DO       Note: This dictation was prepared with Dragon dictation along with smaller phrase technology. Any transcriptional errors that result from this process are unintentional.

## 2019-12-05 NOTE — Patient Instructions (Addendum)
cymbalta 20mg  See me in 5-6 weeks

## 2019-12-06 ENCOUNTER — Encounter: Payer: Self-pay | Admitting: Family Medicine

## 2019-12-06 NOTE — Assessment & Plan Note (Signed)
Chronic problem stable.  Viscosupplementation given today.  Hoping that this will be beneficial.  Encourage weight loss, home exercises, social determinants of health is secondary to patient's comorbidities unable to exercise on a regular basis unable to take time off for surgical intervention at the moment and has other comorbidities that are more pressing.  Hopefully patient will respond well to this.  Could consider PRP.  Follow-up again in 4 to 8 weeks

## 2019-12-11 DIAGNOSIS — R2689 Other abnormalities of gait and mobility: Secondary | ICD-10-CM | POA: Diagnosis not present

## 2019-12-11 DIAGNOSIS — M545 Low back pain: Secondary | ICD-10-CM | POA: Diagnosis not present

## 2019-12-12 ENCOUNTER — Other Ambulatory Visit: Payer: Self-pay | Admitting: Neurosurgery

## 2019-12-12 DIAGNOSIS — M47816 Spondylosis without myelopathy or radiculopathy, lumbar region: Secondary | ICD-10-CM

## 2019-12-18 DIAGNOSIS — G8929 Other chronic pain: Secondary | ICD-10-CM | POA: Insufficient documentation

## 2019-12-18 DIAGNOSIS — R635 Abnormal weight gain: Secondary | ICD-10-CM | POA: Diagnosis not present

## 2019-12-18 DIAGNOSIS — Z6841 Body Mass Index (BMI) 40.0 and over, adult: Secondary | ICD-10-CM | POA: Diagnosis not present

## 2019-12-18 DIAGNOSIS — N3281 Overactive bladder: Secondary | ICD-10-CM | POA: Diagnosis not present

## 2019-12-18 DIAGNOSIS — R32 Unspecified urinary incontinence: Secondary | ICD-10-CM | POA: Diagnosis not present

## 2019-12-18 DIAGNOSIS — M549 Dorsalgia, unspecified: Secondary | ICD-10-CM | POA: Insufficient documentation

## 2019-12-19 ENCOUNTER — Telehealth: Payer: Self-pay | Admitting: Internal Medicine

## 2019-12-19 DIAGNOSIS — I509 Heart failure, unspecified: Secondary | ICD-10-CM

## 2019-12-19 DIAGNOSIS — N183 Chronic kidney disease, stage 3 unspecified: Secondary | ICD-10-CM

## 2019-12-19 NOTE — Telephone Encounter (Signed)
done

## 2019-12-19 NOTE — Telephone Encounter (Signed)
Pt requesting a referral to [facility] to see Dr. Oval Linsey and another nephrology referral to [facility].   She wants to stay more local.   Can you please put referrals in?  Thanks Cecille Rubin

## 2019-12-20 ENCOUNTER — Ambulatory Visit
Admission: RE | Admit: 2019-12-20 | Discharge: 2019-12-20 | Disposition: A | Discharge status: Home / Self Care | Payer: BC Managed Care – PPO | Source: Ambulatory Visit | Attending: Neurosurgery | Admitting: Neurosurgery

## 2019-12-20 ENCOUNTER — Other Ambulatory Visit: Payer: Self-pay

## 2019-12-20 DIAGNOSIS — M545 Low back pain: Secondary | ICD-10-CM | POA: Diagnosis not present

## 2019-12-20 DIAGNOSIS — M47816 Spondylosis without myelopathy or radiculopathy, lumbar region: Secondary | ICD-10-CM

## 2019-12-23 DIAGNOSIS — G4733 Obstructive sleep apnea (adult) (pediatric): Secondary | ICD-10-CM | POA: Diagnosis not present

## 2020-01-13 ENCOUNTER — Encounter: Payer: Self-pay | Admitting: Family Medicine

## 2020-01-13 ENCOUNTER — Other Ambulatory Visit: Payer: Self-pay

## 2020-01-13 ENCOUNTER — Ambulatory Visit: Payer: BC Managed Care – PPO | Admitting: Family Medicine

## 2020-01-13 DIAGNOSIS — M170 Bilateral primary osteoarthritis of knee: Secondary | ICD-10-CM | POA: Diagnosis not present

## 2020-01-13 MED ORDER — DULOXETINE HCL 20 MG PO CPEP: 20.0000 mg | ORAL_CAPSULE | Freq: Every day | ORAL | 3 refills | Status: DC

## 2020-01-13 NOTE — Assessment & Plan Note (Signed)
Steroid injections given today secondary to failed viscosupplementation.  Discussed possible PRP injections.  Discussed with patient about topical anti-inflammatories and prescription given.  Increase Cymbalta to 40 mg in hopes this will be helpful as well with some of the chronic aches and pains.  Follow-up with me again in 10 weeks

## 2020-01-13 NOTE — Progress Notes (Signed)
[facility] [address] [place] Phone: [phone] Subjective:   I Suzanne Hansen am serving as a Education administrator for Dr. Hulan Saas.  This visit occurred during the SARS-CoV-2 public health emergency.  Safety protocols were in place, including screening questions prior to the visit, additional usage of staff PPE, and extensive cleaning of exam room while observing appropriate contact time as indicated for disinfecting solutions.   I'm seeing this patient by the request  of:  Biagio Borg, MD  CC: Bilateral knee pain  [id number]   12/05/2019 Chronic problem stable.  Viscosupplementation given today.  Hoping that this will be beneficial.  Encourage weight loss, home exercises, social determinants of health is secondary to patient's comorbidities unable to exercise on a regular basis unable to take time off for surgical intervention at the moment and has other comorbidities that are more pressing.  Hopefully patient will respond well to this.  Could consider PRP.  Follow-up again in 4 to 8 weeks  01/13/2020 Suzanne Hansen is a 64 y.o. female coming in with complaint of bilateral knee pain. States the knees are not doing well. States she feel she has gotten worse.  Patient states that she does not feel that the gel injections are very helpful this time.  Starting to affect daily activities on a more regular basis.  Rates the severity of pain is 7 out of 10.     Past Medical History:  Diagnosis Date  . ALLERGIC RHINITIS 05/09/2008  . Allergy   . ANXIETY 05/23/2007  . ARM PAIN, LEFT 10/23/2009  . Arthritis   . Asthma   . ASTHMA 05/23/2007  . Back pain   . Cardiomyopathy ([place]) 09/08/2015  . Chest pain   . CHF (congestive heart failure) ([place])   . Constipation   . CONTACT DERMATITIS 06/09/2009  . Diabetes mellitus without complication ([place])    type II  . Dyspnea    allergies-dust, cig smoke, rag weeds  . ENDOMETRIOSIS NOS 05/23/2007  . FACIAL  PAIN 06/02/2010  . Food allergy   . FREQUENCY, URINARY 05/17/2010  . GERD (gastroesophageal reflux disease)   . GLUCOSE INTOLERANCE 08/28/2009  . HERPES SIMPLEX, UNCOMPLICATED [id number]  . Hyperlipidemia 08/28/2015  . HYPERTHYROIDISM 05/23/2007  . Hypoactive thyroid   . HYPOTHYROIDISM 05/09/2008  . Impaired glucose tolerance 01/28/2011  . INSOMNIA, HX OF 05/23/2007  . Joint pain   . LATERAL EPICONDYLITIS, RIGHT 03/10/2009  . LIPOMA 10/22/2010  . NECK PAIN 11/10/2010  . OBESITY 05/23/2007  . Osteopenia 09/28/2018  . Plantar fasciitis    Both feet  . Pre-diabetes   . SHOULDER PAIN, LEFT 05/17/2010  . SINUSITIS- ACUTE-NOS 11/03/2008  . SKIN LESION 11/10/2010  . Sleep apnea   . Stage III chronic kidney disease    Stage III  . Unspecified Chest Pain 07/25/2008  . UNSPECIFIED URTICARIA 06/04/2009  . URI 08/28/2009  . UTI 04/29/2008  . VITAMIN D DEFICIENCY 08/28/2009  . Wheezing 07/12/2010   Past Surgical History:  Procedure Laterality Date  . COLONOSCOPY    . ENDOMETRIAL ABLATION    . LAMINECTOMY  1996  . LAPAROTOMY     exploratory  . lower back surgery  07/06/2017   cyst  . POLYPECTOMY    . s/p endometrial ablation  12/06  . TUBAL LIGATION    . UTERINE FIBROID SURGERY    . WISDOM TOOTH EXTRACTION     Social History   Socioeconomic History  . Marital status: Divorced  Spouse name: Not on file  . Number of children: 3  . Years of education: Not on file  . Highest education level: Not on file  Occupational History  . Occupation: SOCIAL WORKER    Employer: [employer]    Comment: [place]  Tobacco Use  . Smoking status: Former Smoker    Packs/day: 0.50    Years: 12.00    Pack years: 6.00    Types: Cigarettes    Quit date: 11/05/1987    Years since quitting: 32.2  . Smokeless tobacco: Never Used  Substance and Sexual Activity  . Alcohol use: No    Alcohol/week: 1.0 standard drinks    Types: 1 Glasses of wine per week    Comment: WINE  . Drug use: No  .  Sexual activity: Not on file  Other Topics Concern  . Not on file  Social History Narrative  . Not on file   Social Determinants of Health   Financial Resource Strain:   . Difficulty of Paying Living Expenses:   Food Insecurity:   . Worried About Charity fundraiser in the Last Year:   . Arboriculturist in the Last Year:   Transportation Needs:   . Film/video editor (Medical):   [name] Lack of Transportation (Non-Medical):   Physical Activity:   . Days of Exercise per Week:   . Minutes of Exercise per Session:   Stress:   . Feeling of Stress :   Social Connections:   . Frequency of Communication with Friends and Family:   . Frequency of Social Gatherings with Friends and Family:   . Attends Religious Services:   . Active Member of Clubs or Organizations:   . Attends Archivist Meetings:   [name] Marital Status:    Allergies  Allergen Reactions  . Shellfish Allergy Anaphylaxis  . Clarithromycin Nausea And Vomiting  . Latex Itching and Other (See Comments)    ITCHING AND COLD SORES AROUND MOUTH WHEN LATEX GLOVES USED BY THE DENTIST/HYGENTIST  . Metronidazole Nausea And Vomiting  . Zostavax [Zoster Vaccine Live]     Arm swelled  . Adhesive [Tape] Dermatitis    Plastic tape   . Lisinopril Cough  . Peanut Oil     Pt says it's ok   Family History  Problem Relation Age of Onset  . Diabetes Cousin   . Hypertension Cousin   . Heart disease Cousin   . Heart disease Mother        Rheumatic heart disease  . Depression Mother   . COPD Father   . Hypertension Father   . Cancer Maternal Grandmother        Breast  . Diabetes Sister   . Kidney disease Sister     Current Outpatient Medications (Endocrine & Metabolic):  .  levothyroxine (SYNTHROID) 112 MCG tablet, Take 1 tablet (112 mcg total) by mouth daily. (Patient taking differently: Take 112 mcg by mouth daily before breakfast. )  Current Outpatient Medications (Cardiovascular):  .  atorvastatin (LIPITOR) 20 MG  tablet, Take 1 tablet (20 mg total) by mouth daily. [name]  losartan (COZAAR) 25 MG tablet, Take 12.5 mg by mouth daily.  [name]  torsemide (DEMADEX) 20 MG tablet, Take 40-60 mg by mouth See admin instructions. 60 mg in the morning and 40 mg evening time.  Current Outpatient Medications (Respiratory):  .  albuterol (VENTOLIN HFA) 108 (90 Base) MCG/ACT inhaler, Inhale 2 puffs into the lungs every 4 (four)  hours as needed for wheezing or shortness of breath. .  budesonide-formoterol (SYMBICORT) 160-4.5 MCG/ACT inhaler, Inhale 2 puffs into the lungs 2 (two) times daily. .  fexofenadine (ALLEGRA) 180 MG tablet, Take 180 mg by mouth daily.   .  fluticasone (FLONASE) 50 MCG/ACT nasal spray, Place 2 sprays into both nostrils daily. (Patient taking differently: Place 2 sprays into both nostrils daily as needed for allergies. ) .  guaiFENesin (MUCINEX) 600 MG 12 hr tablet, Take 2 tablets (1,200 mg total) by mouth 2 (two) times daily as needed for to loosen phlegm.  Current Outpatient Medications (Analgesics):  .  allopurinol (ZYLOPRIM) 100 MG tablet, Take 2 tablets (200 mg total) by mouth daily. Take 200 mg by mouth daily. [name]  aspirin 81 MG EC tablet, Take 81 mg by mouth daily.   .  traMADol (ULTRAM) 50 MG tablet, Take 1 tablet (50 mg total) by mouth every 6 (six) hours as needed. (Patient taking differently: Take 50 mg by mouth every 6 (six) hours as needed (pain). )   Current Outpatient Medications (Other):  [name]  buPROPion (WELLBUTRIN SR) 150 MG 12 hr tablet, Take 1 tablet (150 mg total) by mouth daily. .  cyclobenzaprine (FLEXERIL) 10 MG tablet, Take 1 tablet (10 mg total) by mouth at bedtime as needed for muscle spasms. .  Diclofenac Sodium 2 % SOLN, Place 2 g onto the skin 2 (two) times daily. .  DULoxetine (CYMBALTA) 20 MG capsule, Take 1 capsule (20 mg total) by mouth daily. .  fluconazole (DIFLUCAN) 150 MG tablet, 1 tab by mouth every 3 days as needed .  gabapentin (NEURONTIN) 300 MG capsule, TAKE 1 CAPSULE  BY MOUTH THREE TIMES A DAY (Patient taking differently: Take 300 mg by mouth 3 (three) times daily. ) .  KLOR-CON M20 20 MEQ tablet, Take 40-60 mEq by mouth See admin instructions. 60 meq in the morning, 40 meq twice daily (midday, and bedtime) .  Menthol-Methyl Salicylate (MUSCLE RUB) 10-15 % CREA, Apply 1 application topically as needed for muscle pain. .  Multiple Vitamin (MULTIVITAMIN) capsule, Take 1 capsule by mouth daily.   [name]  omeprazole (PRILOSEC) 20 MG capsule, Take 20 mg by mouth daily as needed (acid reflux).  .  tolterodine (DETROL LA) 4 MG 24 hr capsule, Take 1 capsule (4 mg total) by mouth daily. .  valACYclovir (VALTREX) 1000 MG tablet, Take 500 mg by mouth daily as needed (cold sores).  .  DULoxetine (CYMBALTA) 20 MG capsule, Take 1 capsule (20 mg total) by mouth daily.   Reviewed prior external information including notes and imaging from  primary care provider As well as notes that were available from care everywhere and other healthcare systems.  Past medical history, social, surgical and family history all reviewed in electronic medical record.  No pertanent information unless stated regarding to the chief complaint.   Review of Systems:  No headache, visual changes, nausea, vomiting, diarrhea, constipation, dizziness, abdominal pain, skin rash, fevers, chills, night sweats, weight loss, swollen lymph nodes, body aches, joint swelling, chest pain, shortness of breath, mood changes. POSITIVE muscle aches  Objective  Blood pressure 110/80, pulse 80, height 5\' 5"  (1.651 m), weight 245 lb (111.1 kg), SpO2 93 %.   General: No apparent distress alert and oriented x3 mood and affect normal, dressed appropriately.  HEENT: Pupils equal, extraocular movements intact  Respiratory: Patient's speak in full sentences and does not appear short of breath  Cardiovascular: No lower extremity edema, non tender, no erythema  Neuro: Cranial nerves II through XII are intact, neurovascularly  intact in all extremities with 2+ DTRs and 2+ pulses.  Gait antalgic Knee: Bilateral valgus deformity noted.  Abnormal thigh to calf ratio.  Tender to palpation over medial and PF joint line.  ROM full in flexion and extension and lower leg rotation. instability with valgus force.  painful patellar compression. Patellar glide with moderate crepitus. Patellar and quadriceps tendons unremarkable. Hamstring and quadriceps strength is normal.  After informed written and verbal consent, patient was seated on exam table. Right knee was prepped with alcohol swab and utilizing anterolateral approach, patient's right knee space was injected with 4:1  marcaine 0.5%: Kenalog 40mg /dL. Patient tolerated the procedure well without immediate complications.  After informed written and verbal consent, patient was seated on exam table. Left knee was prepped with alcohol swab and utilizing anterolateral approach, patient's left knee space was injected with 4:1  marcaine 0.5%: Kenalog 40mg /dL. Patient tolerated the procedure well without immediate complications.     Impression and Recommendations:     This case required medical decision making of moderate complexity. The above documentation has been reviewed and is accurate and complete Lyndal Pulley, DO       Note: This dictation was prepared with Dragon dictation along with smaller phrase technology. Any transcriptional errors that result from this process are unintentional.

## 2020-01-13 NOTE — Patient Instructions (Addendum)
Good to see you Injection today  Increase cymbalta to 40 mg Read about PRP See me again in 10 weeks or call for PRP

## 2020-01-14 DIAGNOSIS — R3915 Urgency of urination: Secondary | ICD-10-CM | POA: Diagnosis not present

## 2020-01-14 DIAGNOSIS — I5032 Chronic diastolic (congestive) heart failure: Secondary | ICD-10-CM | POA: Diagnosis not present

## 2020-01-14 DIAGNOSIS — M62838 Other muscle spasm: Secondary | ICD-10-CM | POA: Diagnosis not present

## 2020-01-14 DIAGNOSIS — E559 Vitamin D deficiency, unspecified: Secondary | ICD-10-CM | POA: Diagnosis not present

## 2020-01-14 DIAGNOSIS — N1831 Chronic kidney disease, stage 3a: Secondary | ICD-10-CM | POA: Diagnosis not present

## 2020-01-14 DIAGNOSIS — N3941 Urge incontinence: Secondary | ICD-10-CM | POA: Diagnosis not present

## 2020-01-14 DIAGNOSIS — M6281 Muscle weakness (generalized): Secondary | ICD-10-CM | POA: Diagnosis not present

## 2020-01-14 DIAGNOSIS — R7303 Prediabetes: Secondary | ICD-10-CM | POA: Diagnosis not present

## 2020-01-22 ENCOUNTER — Encounter: Payer: Self-pay | Admitting: Cardiovascular Disease

## 2020-01-22 ENCOUNTER — Ambulatory Visit: Payer: BC Managed Care – PPO | Admitting: Cardiovascular Disease

## 2020-01-22 ENCOUNTER — Telehealth: Payer: Self-pay

## 2020-01-22 ENCOUNTER — Other Ambulatory Visit: Payer: Self-pay

## 2020-01-22 VITALS — BP 140/70 | HR 74 | Ht 64.5 in | Wt 238.0 lb

## 2020-01-22 DIAGNOSIS — G4733 Obstructive sleep apnea (adult) (pediatric): Secondary | ICD-10-CM | POA: Diagnosis not present

## 2020-01-22 DIAGNOSIS — Z9989 Dependence on other enabling machines and devices: Secondary | ICD-10-CM

## 2020-01-22 DIAGNOSIS — I480 Paroxysmal atrial fibrillation: Secondary | ICD-10-CM

## 2020-01-22 DIAGNOSIS — I5032 Chronic diastolic (congestive) heart failure: Secondary | ICD-10-CM | POA: Diagnosis not present

## 2020-01-22 HISTORY — DX: Chronic diastolic (congestive) heart failure: I50.32

## 2020-01-22 MED ORDER — TORSEMIDE 20 MG PO TABS: ORAL_TABLET | ORAL | 5 refills | Status: DC

## 2020-01-22 MED ORDER — ATORVASTATIN CALCIUM 20 MG PO TABS: 20.0000 mg | ORAL_TABLET | Freq: Every day | ORAL | 0 refills | Status: DC

## 2020-01-22 MED ORDER — KLOR-CON M20 20 MEQ PO TBCR: 20.0000 meq | EXTENDED_RELEASE_TABLET | Freq: Every day | ORAL | 5 refills | Status: DC

## 2020-01-22 NOTE — Addendum Note (Signed)
Addended by: Alvina Filbert B on: 01/22/2020 06:29 PM   Modules accepted: Orders

## 2020-01-22 NOTE — Telephone Encounter (Signed)
Medication sent to pharmacy  

## 2020-01-22 NOTE — Patient Instructions (Addendum)
Medication Instructions:  TAKE YOUR TORSEMIDE 40 MG IN THE MORNING AND 20 MG IN THE EVENING   TAKE POTASSIUM 20 MEQ TWICE A DAY   *If you need a refill on your cardiac medications before your next appointment, please call your pharmacy*  Lab Work: NONE   Testing/Procedures: NONE   Follow-Up: At [facility], you and your health needs are our priority.  As part of our continuing mission to provide you with exceptional heart care, we have created designated Provider Care Teams.  These Care Teams include your primary Cardiologist (physician) and Advanced Practice Providers (APPs -  Physician Assistants and Nurse Practitioners) who all work together to provide you with the care you need, when you need it.  We recommend signing up for the patient portal called "MyChart".  Sign up information is provided on this After Visit Summary.  MyChart is used to connect with patients for Virtual Visits (Telemedicine).  Patients are able to view lab/test results, encounter notes, upcoming appointments, etc.  Non-urgent messages can be sent to your provider as well.   To learn more about what you can do with MyChart, go to [email or web address].    Your next appointment:   4 month(s)  You will receive a reminder letter in the mail two months in advance. If you don't receive a letter, please call our office to schedule the follow-up appointment.  The format for your next appointment:   In Person  Provider:   You may see DR [facility]  or one of the following Advanced Practice Providers on your designated Care Team:    Kerin Ransom, PA-C  [place], [place]  Coletta Memos, [place]  Your physician recommends that you schedule a follow-up appointment in: 1 MONTH WITH PHARM D    Other Instructions  MONITOR AND LOG YOUR BLOOD PRESSURE. BRING READINGS TO FOLLOW UP   WEIGH YOURSELF DAILY FIRST THING IN THE MORNING AFTER YOU URINATE IF YOUR WEIGHT IS UP 2 POUNDS IN 24 HOURS OR 5 POUNDS IN A WEEK TAKE  TORSEMIDE 40 MG TWICE A DAY AND AN EXTRA POTASSIUM   SOMEONE FROM THE PREP TEAM WILL BE CALLING YOU TO ARRANGE

## 2020-01-22 NOTE — Telephone Encounter (Signed)
1.Medication Requested:atorvastatin (LIPITOR) 20 MG tablet  2. Pharmacy (Name, Street, City):[facility] [phone] - [place], [address] AT [address]  3. On Med List: Yes   4. Last Visit with PCP: 09/2019   5. Next visit date with PCP: no appt is made at this time    Agent: Please be advised that RX refills may take up to 3 business days. We ask that you follow-up with your pharmacy.

## 2020-01-22 NOTE — Progress Notes (Signed)
Cardiology Office Note   Date:  01/22/2020   ID:  Suzanne Hansen, DOB 11-08-1955, MRN [id number]  PCP:  Biagio Borg, MD  Cardiologist:   Skeet Latch, MD   No chief complaint on file.    History of Present Illness: Suzanne Hansen is a 64 y.o. female paroxysmal atrial fibrillation, chronic systolic and diastolic heart failure, CKD III, OSA, diabetes, prior tobacco use, and hypothyroidism who is being seen today to establish care.  She was first seen in 2016 when she is diagnosed with acute systolic and diastolic heart failure.  Prior to that she had a stress Myoview 09/2013 that revealed LVEF 48% and no ischemia.  She was followed by her primary care physician and switch from hydrochlorothiazide to Lasix.  She had no symptoms at the time she first saw Dr. Angelena Form in 2016.  She was started on carvedilol and lisinopril.    Lisinopril was subsequently converted to losartan due to cough.  Her symptoms were felt to be nonischemic.  She notes that she was under a lot of stress at the time.  She subsequently transferred her care to [facility] with Dr. Kirtland Bouchard.  He underwent CPX testing in 2018 and was found to have moderate cardiac limitation and deconditioning.  Echo 01/2017 revealed LVEF 50-55%.  Her most recent echo 05/2018 revealed LVEF 55 to 60% with grade 1 diastolic dysfunction.  She reportedly has a history of atrial fibrillation that occurred in the setting of her initial heart failure symptoms.  She has not had any recurrence and has never been on anticoagulation.  She was symptomatic in atrial fibrillation.  [name] had a spinal fusion in December 2020 and has difficulty driving now so she wanted to establish care in [place].  Her pain has improved after the fusion but she still has chronic pain.  She had an episode of chest pain last week.  However she had recently started Cymbalta and read that it can cause chest pain.  She stopped it and this alleviated both the chest pain and some  tremors that she had developed.  She is disappointed because he did a good job with helping her back pain.  She never had exertion pain.  She recently saw a new nephrologist and her diuretics were reduced.  She was taking NSAIDs and these were also discontinued.  Her renal function has subsequently normalized.  She has not had any increase in her edema since this time.  Her cramps have also been better.  After her surgery she had some issues with bladder incontinence and lowering the dose of her diuretic seems to help this as well.  She denies orthopnea or PND.  She has a history of sleep apnea but did not tolerate CPAP.  She continues to have a lot of pain and uses Tylenol and gabapentin.  This limits her ability to exercise.  After her back surgery she initially did physical therapy but stopped it because of the high co-pay.  She has difficulty navigating steps but does have steps in her home.  She started in healthy weight and wellness clinic with was unable to continue because of arriving to the program week.  Past Medical History:  Diagnosis Date  . ALLERGIC RHINITIS 05/09/2008  . Allergy   . ANXIETY 05/23/2007  . ARM PAIN, LEFT 10/23/2009  . Arthritis   . Asthma   . ASTHMA 05/23/2007  . Back pain   . Cardiomyopathy ([place]) 09/08/2015  . Chest pain   .  CHF (congestive heart failure) ([place])   . Chronic diastolic heart failure ([place]) 01/22/2020  . Constipation   . CONTACT DERMATITIS 06/09/2009  . Diabetes mellitus without complication ([place])    type II  . Dyspnea    allergies-dust, cig smoke, rag weeds  . ENDOMETRIOSIS NOS 05/23/2007  . FACIAL PAIN 06/02/2010  . Food allergy   . FREQUENCY, URINARY 05/17/2010  . GERD (gastroesophageal reflux disease)   . GLUCOSE INTOLERANCE 08/28/2009  . HERPES SIMPLEX, UNCOMPLICATED [id number]  . Hyperlipidemia 08/28/2015  . HYPERTHYROIDISM 05/23/2007  . Hypoactive thyroid   . HYPOTHYROIDISM 05/09/2008  . Impaired glucose tolerance 01/28/2011  . INSOMNIA, HX OF  05/23/2007  . Joint pain   . LATERAL EPICONDYLITIS, RIGHT 03/10/2009  . LIPOMA 10/22/2010  . NECK PAIN 11/10/2010  . OBESITY 05/23/2007  . Osteopenia 09/28/2018  . Plantar fasciitis    Both feet  . Pre-diabetes   . SHOULDER PAIN, LEFT 05/17/2010  . SINUSITIS- ACUTE-NOS 11/03/2008  . SKIN LESION 11/10/2010  . Sleep apnea   . Stage III chronic kidney disease    Stage III  . Unspecified Chest Pain 07/25/2008  . UNSPECIFIED URTICARIA 06/04/2009  . URI 08/28/2009  . UTI 04/29/2008  . VITAMIN D DEFICIENCY 08/28/2009  . Wheezing 07/12/2010    Past Surgical History:  Procedure Laterality Date  . COLONOSCOPY    . ENDOMETRIAL ABLATION    . LAMINECTOMY  1996  . LAPAROTOMY     exploratory  . lower back surgery  07/06/2017   cyst  . POLYPECTOMY    . s/p endometrial ablation  12/06  . TUBAL LIGATION    . UTERINE FIBROID SURGERY    . WISDOM TOOTH EXTRACTION       Current Outpatient Medications  Medication Sig Dispense Refill  . albuterol (VENTOLIN HFA) 108 (90 Base) MCG/ACT inhaler Inhale 2 puffs into the lungs every 4 (four) hours as needed for wheezing or shortness of breath. 18 g 11  . allopurinol (ZYLOPRIM) 100 MG tablet Take 2 tablets (200 mg total) by mouth daily. Take 200 mg by mouth daily. 180 tablet 3  . aspirin 81 MG EC tablet Take 81 mg by mouth daily.      . budesonide-formoterol (SYMBICORT) 160-4.5 MCG/ACT inhaler Inhale 2 puffs into the lungs 2 (two) times daily. 10.2 g 6  . buPROPion (WELLBUTRIN SR) 150 MG 12 hr tablet Take 1 tablet (150 mg total) by mouth daily. 30 tablet 0  . cyclobenzaprine (FLEXERIL) 10 MG tablet Take 1 tablet (10 mg total) by mouth at bedtime as needed for muscle spasms. 90 tablet 3  . Diclofenac Sodium 2 % SOLN Place 2 g onto the skin 2 (two) times daily. 112 g 3  . DULoxetine (CYMBALTA) 20 MG capsule Take 1 capsule (20 mg total) by mouth daily. 30 capsule 3  . DULoxetine (CYMBALTA) 20 MG capsule Take 1 capsule (20 mg total) by mouth daily. 60 capsule 3  .  fexofenadine (ALLEGRA) 180 MG tablet Take 180 mg by mouth daily.      . fluconazole (DIFLUCAN) 150 MG tablet 1 tab by mouth every 3 days as needed 2 tablet 1  . fluticasone (FLONASE) 50 MCG/ACT nasal spray Place 2 sprays into both nostrils daily. (Patient taking differently: Place 2 sprays into both nostrils daily as needed for allergies. ) 16 g 6  . gabapentin (NEURONTIN) 300 MG capsule TAKE 1 CAPSULE BY MOUTH THREE TIMES A DAY (Patient taking differently: Take 300 mg by mouth 3 (three) times  daily. ) 90 capsule 1  . guaiFENesin (MUCINEX) 600 MG 12 hr tablet Take 2 tablets (1,200 mg total) by mouth 2 (two) times daily as needed for to loosen phlegm. 60 tablet 1  . KLOR-CON M20 20 MEQ tablet Take 1 tablet (20 mEq total) by mouth daily. OK TO TAKE AN EXTRA ON DAYS YOU TAKE EXTRA TORSEMIDE 70 tablet 5  . levothyroxine (SYNTHROID) 112 MCG tablet Take 1 tablet (112 mcg total) by mouth daily. (Patient taking differently: Take 112 mcg by mouth daily before breakfast. ) 90 tablet 3  . losartan (COZAAR) 25 MG tablet Take 12.5 mg by mouth daily.     . Menthol-Methyl Salicylate (MUSCLE RUB) 10-15 % CREA Apply 1 application topically as needed for muscle pain.    . Multiple Vitamin (MULTIVITAMIN) capsule Take 1 capsule by mouth daily.      [name] omeprazole (PRILOSEC) 20 MG capsule Take 20 mg by mouth daily as needed (acid reflux).     . tolterodine (DETROL LA) 4 MG 24 hr capsule Take 1 capsule (4 mg total) by mouth daily. 90 capsule 3  . torsemide (DEMADEX) 20 MG tablet TAKE 2 TABLETS IN THE MORNING AND 1 IN THE EVENING. OK TO TAKE AN EXTRA DAILY AS NEEDED FOR WEIGHT GAIN 60 tablet 5  . traMADol (ULTRAM) 50 MG tablet Take 1 tablet (50 mg total) by mouth every 6 (six) hours as needed. (Patient taking differently: Take 50 mg by mouth every 6 (six) hours as needed (pain). ) 30 tablet 0  . valACYclovir (VALTREX) 1000 MG tablet Take 500 mg by mouth daily as needed (cold sores).   0  . atorvastatin (LIPITOR) 20 MG tablet  Take 1 tablet (20 mg total) by mouth daily. 90 tablet 0   No current facility-administered medications for this visit.    Allergies:   Shellfish allergy, Clarithromycin, Latex, Metronidazole, Zostavax [zoster vaccine live], Adhesive [tape], Lisinopril, and Peanut oil    Social History:  The patient  reports that she quit smoking about 32 years ago. Her smoking use included cigarettes. She has a 6.00 pack-year smoking history. She has never used smokeless tobacco. She reports that she does not drink alcohol or use drugs.   Family History:  The patient's family history includes COPD in her father; Cancer in her maternal grandmother; Depression in her mother; Diabetes in her cousin and sister; Heart disease in her cousin and mother; Hypertension in her cousin and father; Kidney disease in her sister.    ROS:  Please see the history of present illness.   Otherwise, review of systems are positive for none.   All other systems are reviewed and negative.    PHYSICAL EXAM: VS:  BP 140/70   Pulse 74   Ht 5' 4.5" (1.638 m)   Wt 238 lb (108 kg)   SpO2 99%   BMI 40.22 kg/m  , BMI Body mass index is 40.22 kg/m. GENERAL:  Well appearing HEENT:  Pupils equal round and reactive, fundi not visualized, oral mucosa unremarkable NECK:  No jugular venous distention, waveform within normal limits, carotid upstroke brisk and symmetric, no bruits, no thyromegaly LYMPHATICS:  No cervical adenopathy LUNGS:  Clear to auscultation bilaterally HEART:  RRR.  PMI not displaced or sustained,S1 and S2 within normal limits, no S3, no S4, no clicks, no rubs, no murmurs ABD:  Flat, positive bowel sounds normal in frequency in pitch, no bruits, no rebound, no guarding, no midline pulsatile mass, no hepatomegaly, no splenomegaly EXT:  2 plus pulses throughout, trace edema, no cyanosis no clubbing SKIN:  No rashes no nodules NEURO:  Cranial nerves II through XII grossly intact, motor grossly intact throughout PSYCH:   Cognitively intact, oriented to person place and time  EKG:  EKG is ordered today. The ekg ordered today demonstrates sinus rhythm.  Rate 74 bpm.  Low voltage.     Recent Labs: 10/02/2019: ALT 18; BUN 18; Creatinine, Ser 1.18; Hemoglobin 11.6; Platelets 404.0; Potassium 4.2; Sodium 139; TSH 0.67    Lipid Panel    Component Value Date/Time   CHOL 132 10/02/2019 1532   CHOL 148 07/18/2019 0900   TRIG 75.0 10/02/2019 1532   HDL 69.20 10/02/2019 1532   HDL 83 07/18/2019 0900   CHOLHDL 2 10/02/2019 1532   VLDL 15.0 10/02/2019 1532   LDLCALC 48 10/02/2019 1532   LDLCALC 50 07/18/2019 0900      Wt Readings from Last 3 Encounters:  01/22/20 238 lb (108 kg)  01/13/20 245 lb (111.1 kg)  12/05/19 244 lb (110.7 kg)      ASSESSMENT AND PLAN:  # Chronic diastolic heart failure: # Essential hypertension:  Previously had systolic dysfunction that has subsequently improved.  She has nonischemic cardiomyopathy.  She never had a cath.  Stress testing has been negative for ischemia.  She has trace edema despite reducing her torsemide.  She will continue with 40 mg in the morning and 20 in the evenings.  If she has increased edema, with weight gain of 2 pounds in the day or 5 in a week or shortness of breath she can take an extra dose of torsemide.  She does need extra dose of torsemide and she will need to also take an extra potassium.  She understands that she needs to continue weighing herself daily.  Continue carvedilol and losartan.  Blood pressure was elevated here but she reports it has been well have been controlled at home.  Continue to monitor.  # CKD II: Renal function has improved.  Continue to monitor closely.  Reducing diuretics as above.  Continue to avoid NSAIDs.  # Hyperlipidemia: Continue atorvastatin.  LDL 43 on 09/2019.   # OSA: Didn't tolerate CPAP.  # Obesity:  Refer to PREP program through the [facility].   Current medicines are reviewed at length with the patient today.   The patient does not have concerns regarding medicines.  The following changes have been made:  no change  Labs/ tests ordered today include:  No orders of the defined types were placed in this encounter.    Disposition:   FU with Sheral Pfahler C. Oval Linsey, MD, [facility]      Signed, [place] Oval Linsey, MD, [facility]  01/22/2020 6:20 PM    [facility]

## 2020-01-24 ENCOUNTER — Telehealth: Payer: Self-pay

## 2020-01-24 NOTE — Telephone Encounter (Signed)
Call placed to patient reference PREP referral. Explained outline of program. Patient is interested and agreeable to starting 5/3 M/W 230p-345p x 12 wks at [facility]. Will call back next week to schedule intake appt.

## 2020-01-31 ENCOUNTER — Telehealth: Payer: Self-pay

## 2020-01-31 NOTE — Telephone Encounter (Signed)
LVMT pt to schedule intake appt for PREP classes. Left available times on message. Received text back she will call me later.

## 2020-02-04 DIAGNOSIS — N3281 Overactive bladder: Secondary | ICD-10-CM | POA: Diagnosis not present

## 2020-02-04 DIAGNOSIS — M62838 Other muscle spasm: Secondary | ICD-10-CM | POA: Diagnosis not present

## 2020-02-04 DIAGNOSIS — N3941 Urge incontinence: Secondary | ICD-10-CM | POA: Diagnosis not present

## 2020-02-04 DIAGNOSIS — M6281 Muscle weakness (generalized): Secondary | ICD-10-CM | POA: Diagnosis not present

## 2020-02-05 ENCOUNTER — Telehealth: Payer: Self-pay

## 2020-02-05 NOTE — Telephone Encounter (Signed)
Followed up with patient yesterday reference PREP start 02/10/20. Unfortunately the time will not work due to work [employer] will also be a barrier. She has start swimming one day per week. Is interested in something closer to home. Will connect her with some resources closer to [facility].

## 2020-02-07 ENCOUNTER — Telehealth: Payer: Self-pay | Admitting: *Deleted

## 2020-02-07 NOTE — Telephone Encounter (Signed)
Patient referred to PREP via HTN Clinic with Dr. Oval Linsey. Patient unable to participate in PREP due to location and conflicting work schedules. PREP referred for coordination for [place] membership program.    Patient did not answer call for coordination. Left voicemail for return call.    Landis Martins, MS, ACSM, [facility] Clinical Exercise Physiologist/ Health and Wellness Coach

## 2020-02-11 DIAGNOSIS — R3915 Urgency of urination: Secondary | ICD-10-CM | POA: Diagnosis not present

## 2020-02-11 DIAGNOSIS — N3941 Urge incontinence: Secondary | ICD-10-CM | POA: Diagnosis not present

## 2020-02-11 DIAGNOSIS — N3281 Overactive bladder: Secondary | ICD-10-CM | POA: Diagnosis not present

## 2020-02-11 DIAGNOSIS — M6281 Muscle weakness (generalized): Secondary | ICD-10-CM | POA: Diagnosis not present

## 2020-02-18 ENCOUNTER — Other Ambulatory Visit: Payer: Self-pay | Admitting: Internal Medicine

## 2020-02-18 NOTE — Telephone Encounter (Signed)
Please refill as per office routine med refill policy (all routine meds refilled for 3 mo or monthly per pt preference up to one year from last visit, then month to month grace period for 3 mo, then further med refills will have to be denied)  

## 2020-02-25 ENCOUNTER — Ambulatory Visit (INDEPENDENT_AMBULATORY_CARE_PROVIDER_SITE_OTHER): Payer: BC Managed Care – PPO | Admitting: Pharmacist Clinician (PhC)/ Clinical Pharmacy Specialist

## 2020-02-25 ENCOUNTER — Telehealth: Payer: Self-pay | Admitting: *Deleted

## 2020-02-25 ENCOUNTER — Other Ambulatory Visit: Payer: Self-pay

## 2020-02-25 DIAGNOSIS — I10 Essential (primary) hypertension: Secondary | ICD-10-CM | POA: Diagnosis not present

## 2020-02-25 NOTE — Patient Instructions (Signed)
  Check your blood pressure at home daily and keep record of the readings.  Take your BP meds as follows:  Continue with all your current medications  Bring all of your meds, your BP cuff and your record of home blood pressures to your next appointment.  Exercise as you're able, try to walk approximately 30 minutes per day.  Keep salt intake to a minimum, especially watch canned and prepared boxed foods.  Eat more fresh fruits and vegetables and fewer canned items.  Avoid eating in fast food restaurants.    HOW TO TAKE YOUR BLOOD PRESSURE: . Rest 5 minutes before taking your blood pressure. .  Don't smoke or drink caffeinated beverages for at least 30 minutes before. . Take your blood pressure before (not after) you eat. . Sit comfortably with your back supported and both feet on the floor (don't cross your legs). . Elevate your arm to heart level on a table or a desk. . Use the proper sized cuff. It should fit smoothly and snugly around your bare upper arm. There should be enough room to slip a fingertip under the cuff. The bottom edge of the cuff should be 1 inch above the crease of the elbow. . Ideally, take 3 measurements at one sitting and record the average.    

## 2020-02-25 NOTE — Progress Notes (Signed)
02/26/2020 Suzanne Hansen 08/24/1956 [id number]   HPI:  Suzanne Hansen is a 64 y.o. female patient of Dr Oval Linsey, with a [place] below who presents today for hypertension clinic evaluation.  She was last seen by Dr. Oval Linsey in April and found to have a blood pressure of 140/70.  Patient reported that home blood pressure readings were mostly WNL and no medication changes were made.    Today she returns for a follow up visit.  She notes that her home BP readings are all good, and has no concerns.  She wonders if she even needs the losartan.  Dr. Blenda Mounts note indicates she is on carvedilol as well, but patient reports stopping that some time ago.  She notes that her back pain, though chronic, is not as severe as it was previously.    Past Medical History: Chronic systolic/disatolic HF Echo [id number] EF back to 0000000, grade 1 diastolic dysfunction  CKD stage 3 SCr 1.18, CrCl 43.6 with IBW  DM2 09/2019 A1c 5.8 (highest 6.0) - no meds  OSA CPAP - followed by [facility]  hypothyroidism 09/2019 TSH 0.67 - on levothyroxine 112 mcg daily  Chronic pain Spinal fusion in Dec 2020  low Vitamin D 09/2019: 28.06, took 50,000 IU weekly x 3 months     Blood Pressure Goal:  130/80  Current Medications: losartan 12.5 mg am   Torsemide 40 mg am, 20 mg pm  Family Hx:  Mother had CHF, 2 MI, died at 12; father with hypertension, emphysema, cirrhosis; died at 42; oldest brother with heart disease, asthma; other brother with hypertension DM; sister with hypertension, no meds; 3 daughters, no issues at this time  Social Hx: no tobacco, occasional alcohol; caffeine - previously coffee daily, but now not as frequently  Diet: pineapple juice for muscle tightness and arthritis; more take-out than home cooked; mix of fast food, sandwich shops, restaurants; had trouble with standing for any length after spinal surgery; now trying to make healthier opitons; avoids red meat mostly; weakness is french fries   Exercise: trainer twice weekly after surgery, now doing exercises on her own  Home BP readings: home meter about 9-69 years old [employer] - systolic readings all WNL [id number], diastolic range from 99991111 by recall - no written readings with her  Intolerances: lisinopril caused cough  Labs:  09/2019:  Na 139, K 4.2, Glu 76, BUN 18, SCr 1.18   Wt Readings from Last 3 Encounters:  02/25/20 245 lb (111.1 kg)  01/22/20 238 lb (108 kg)  01/13/20 245 lb (111.1 kg)   BP Readings from Last 3 Encounters:  02/25/20 116/82  01/22/20 140/70  01/13/20 110/80   Pulse Readings from Last 3 Encounters:  02/25/20 78  01/22/20 74  01/13/20 80    Current Outpatient Medications  Medication Sig Dispense Refill  . albuterol (VENTOLIN HFA) 108 (90 Base) MCG/ACT inhaler Inhale 2 puffs into the lungs every 4 (four) hours as needed for wheezing or shortness of breath. 18 g 11  . allopurinol (ZYLOPRIM) 100 MG tablet Take 2 tablets (200 mg total) by mouth daily. Take 200 mg by mouth daily. 180 tablet 3  . aspirin 81 MG EC tablet Take 81 mg by mouth daily.      [name] atorvastatin (LIPITOR) 20 MG tablet Take 1 tablet (20 mg total) by mouth daily. 90 tablet 0  . budesonide-formoterol (SYMBICORT) 160-4.5 MCG/ACT inhaler Inhale 2 puffs into the lungs 2 (two) times daily. 10.2 g 6  . buPROPion [facility]  SR) 150 MG 12 hr tablet Take 1 tablet (150 mg total) by mouth daily. 30 tablet 0  . cyclobenzaprine (FLEXERIL) 10 MG tablet Take 1 tablet (10 mg total) by mouth at bedtime as needed for muscle spasms. 90 tablet 3  . Diclofenac Sodium 2 % SOLN Place 2 g onto the skin 2 (two) times daily. 112 g 3  . DULoxetine (CYMBALTA) 20 MG capsule Take 1 capsule (20 mg total) by mouth daily. 60 capsule 3  . fexofenadine (ALLEGRA) 180 MG tablet Take 180 mg by mouth daily.      . fluconazole (DIFLUCAN) 150 MG tablet 1 tab by mouth every 3 days as needed 2 tablet 1  . fluticasone (FLONASE) 50 MCG/ACT nasal spray Place 2 sprays into both  nostrils daily. (Patient taking differently: Place 2 sprays into both nostrils daily as needed for allergies. ) 16 g 6  . gabapentin (NEURONTIN) 100 MG capsule Take 100 mg by mouth 2 (two) times daily.    [name] guaiFENesin (MUCINEX) 600 MG 12 hr tablet Take 2 tablets (1,200 mg total) by mouth 2 (two) times daily as needed for to loosen phlegm. 60 tablet 1  . KLOR-CON M20 20 MEQ tablet Take 1 tablet (20 mEq total) by mouth daily. OK TO TAKE AN EXTRA ON DAYS YOU TAKE EXTRA TORSEMIDE 70 tablet 5  . levothyroxine (SYNTHROID) 112 MCG tablet Take 1 tablet (112 mcg total) by mouth daily before breakfast. Annual appt due in June w/labs must see provider for future refills 30 tablet 0  . losartan (COZAAR) 25 MG tablet Take 12.5 mg by mouth daily.     . Menthol-Methyl Salicylate (MUSCLE RUB) 10-15 % CREA Apply 1 application topically as needed for muscle pain.    . Multiple Vitamin (MULTIVITAMIN) capsule Take 1 capsule by mouth daily.      [name] omeprazole (PRILOSEC) 20 MG capsule Take 20 mg by mouth daily as needed (acid reflux).     . torsemide (DEMADEX) 20 MG tablet TAKE 2 TABLETS IN THE MORNING AND 1 IN THE EVENING. OK TO TAKE AN EXTRA DAILY AS NEEDED FOR WEIGHT GAIN 60 tablet 5  . traMADol (ULTRAM) 50 MG tablet Take 1 tablet (50 mg total) by mouth every 6 (six) hours as needed. (Patient taking differently: Take 50 mg by mouth every 6 (six) hours as needed (pain). ) 30 tablet 0  . valACYclovir (VALTREX) 1000 MG tablet Take 500 mg by mouth daily as needed (cold sores).   0   No current facility-administered medications for this visit.    Allergies  Allergen Reactions  . Shellfish Allergy Anaphylaxis  . Clarithromycin Nausea And Vomiting  . Latex Itching and Other (See Comments)    ITCHING AND COLD SORES AROUND MOUTH WHEN LATEX GLOVES USED BY THE DENTIST/HYGENTIST  . Metronidazole Nausea And Vomiting  . Zostavax [Zoster Vaccine Live]     Arm swelled  . Adhesive [Tape] Dermatitis    Plastic tape   .  Lisinopril Cough  . Peanut Oil     Pt says it's ok    Past Medical History:  Diagnosis Date  . ALLERGIC RHINITIS 05/09/2008  . Allergy   . ANXIETY 05/23/2007  . ARM PAIN, LEFT 10/23/2009  . Arthritis   . Asthma   . ASTHMA 05/23/2007  . Back pain   . Cardiomyopathy ([place]) 09/08/2015  . Chest pain   . CHF (congestive heart failure) ([place])   . Chronic diastolic heart failure ([place]) 01/22/2020  . Constipation   .  CONTACT DERMATITIS 06/09/2009  . Diabetes mellitus without complication ([place])    type II  . Dyspnea    allergies-dust, cig smoke, rag weeds  . ENDOMETRIOSIS NOS 05/23/2007  . FACIAL PAIN 06/02/2010  . Food allergy   . FREQUENCY, URINARY 05/17/2010  . GERD (gastroesophageal reflux disease)   . GLUCOSE INTOLERANCE 08/28/2009  . HERPES SIMPLEX, UNCOMPLICATED [id number]  . Hyperlipidemia 08/28/2015  . HYPERTHYROIDISM 05/23/2007  . Hypoactive thyroid   . HYPOTHYROIDISM 05/09/2008  . Impaired glucose tolerance 01/28/2011  . INSOMNIA, HX OF 05/23/2007  . Joint pain   . LATERAL EPICONDYLITIS, RIGHT 03/10/2009  . LIPOMA 10/22/2010  . NECK PAIN 11/10/2010  . OBESITY 05/23/2007  . Osteopenia 09/28/2018  . Plantar fasciitis    Both feet  . Pre-diabetes   . SHOULDER PAIN, LEFT 05/17/2010  . SINUSITIS- ACUTE-NOS 11/03/2008  . SKIN LESION 11/10/2010  . Sleep apnea   . Stage III chronic kidney disease    Stage III  . Unspecified Chest Pain 07/25/2008  . UNSPECIFIED URTICARIA 06/04/2009  . URI 08/28/2009  . UTI 04/29/2008  . VITAMIN D DEFICIENCY 08/28/2009  . Wheezing 07/12/2010    Blood pressure 116/82, pulse 78, height 5' 4.5" (1.638 m), weight 245 lb (111.1 kg).  Essential hypertension Patient with essential hypertension, currently at BP goal.  We had discussion about benefits of taking even just the low dose of losartan, with her history of heart failure.  She had previously been on carvedilol as well, unsure of when this was discontinued.  Explained benefits of both medications as well as  potential concerns of low BP should the doses be too high.  Answered all questions.  Patient to continue with losartan 12.5 mg daily.  She is scheduled to follow up with Dr. Oval Linsey in July.     Tommy Medal PharmD CPP [facility] [address] [place], [place] [address] [phone]

## 2020-02-25 NOTE — Telephone Encounter (Signed)
Patient referred to PREP via HTN Clinic with Dr. Oval Linsey. Patient unable to participate in PREP due to location and conflicting work schedules. PREP referred for coordination for [place] membership program.    Patient did not answer call for coordination. Second attempt to reach patient with no ability to leave voicemail.    Landis Martins, MS, ACSM, [facility] Clinical Exercise Physiologist/ Health and Wellness Coach

## 2020-02-26 ENCOUNTER — Encounter: Payer: Self-pay | Admitting: Pharmacist Clinician (PhC)/ Clinical Pharmacy Specialist

## 2020-02-26 NOTE — Assessment & Plan Note (Signed)
Patient with essential hypertension, currently at BP goal.  We had discussion about benefits of taking even just the low dose of losartan, with her history of heart failure.  She had previously been on carvedilol as well, unsure of when this was discontinued.  Explained benefits of both medications as well as potential concerns of low BP should the doses be too high.  Answered all questions.  Patient to continue with losartan 12.5 mg daily.  She is scheduled to follow up with Dr. Oval Linsey in July.

## 2020-03-19 ENCOUNTER — Telehealth: Payer: Self-pay | Admitting: *Deleted

## 2020-03-19 ENCOUNTER — Telehealth: Payer: Self-pay

## 2020-03-19 NOTE — Telephone Encounter (Signed)
3rd attempt for patient care navigation regarding exercise and wellness programming, specifically for [facility] referral. Will wait for patient to return call for are navigation. [facility] can be reached at [phone].    Landis Martins, MS, ACSM, [facility] Clinical Exercise Physiologist/ Health and Wellness Coach

## 2020-03-19 NOTE — Telephone Encounter (Signed)
Pt has f/u in July - I put a note in the appointment to ask.  Thanks!

## 2020-03-19 NOTE — Telephone Encounter (Signed)
Called patients daughter and was informed that the number we had on file was incorrect and updated her chart with her new number.

## 2020-03-21 ENCOUNTER — Other Ambulatory Visit: Payer: Self-pay | Admitting: Internal Medicine

## 2020-03-21 NOTE — Telephone Encounter (Signed)
Please refill as per office routine med refill policy (all routine meds refilled for 3 mo or monthly per pt preference up to one year from last visit, then month to month grace period for 3 mo, then further med refills will have to be denied)  

## 2020-03-23 ENCOUNTER — Ambulatory Visit: Payer: BC Managed Care – PPO | Admitting: Family Medicine

## 2020-03-23 ENCOUNTER — Other Ambulatory Visit: Payer: Self-pay

## 2020-03-23 DIAGNOSIS — M48061 Spinal stenosis, lumbar region without neurogenic claudication: Secondary | ICD-10-CM | POA: Diagnosis not present

## 2020-03-23 DIAGNOSIS — M170 Bilateral primary osteoarthritis of knee: Secondary | ICD-10-CM | POA: Diagnosis not present

## 2020-03-23 MED ORDER — VILAZODONE HCL 10 MG PO TABS
10.0000 mg | ORAL_TABLET | Freq: Every day | ORAL | 0 refills | Status: DC
Start: 2020-03-23 — End: 2020-10-15

## 2020-03-23 NOTE — Progress Notes (Signed)
[place] Phone: [phone] Subjective:   Suzanne Hansen, am serving as a scribe for Dr. Hulan Saas. This visit occurred during the SARS-CoV-2 public health emergency.  Safety protocols were in place, including screening questions prior to the visit, additional usage of staff PPE, and extensive cleaning of exam room while observing appropriate contact time as indicated for disinfecting solutions.   I'm seeing this patient by the request  of:  Biagio Borg, MD  CC: bilateral knee pain   EUM:[id number]   01/13/2020 Steroid injections given today secondary to failed viscosupplementation.  Discussed possible PRP injections.  Discussed with patient about topical anti-inflammatories and prescription given.  Increase Cymbalta to 40 mg in hopes this will be helpful as well with some of the chronic aches and pains.  Follow-up with me again in 10 weeks  Update 03/23/2020 Suzanne Hansen is a 64 y.o. female coming in with complaint of bilateral knee pain. Patient states that she is having cramps in leg with driving. Stopped Cymbalta after getting tremors and crazy dreams. Knee pain is constant. Using Pennsaid and multiple topical analgesics. Patient has been trying everything. Using 5-650mg  Tylenol daily for pain as well as gabapentin. Has a hard time getting up the stairs in her house.   Has a follow up with Dr. Saintclair Halsted tomorrow.  Patient states that the back is still severe.  Wondering what else is necessary at this time.  States that her daily quality has decreased significantly.  Also having right hand middle finger redness and swelling.         Past Medical History:  Diagnosis Date  . ALLERGIC RHINITIS 05/09/2008  . Allergy   . ANXIETY 05/23/2007  . ARM PAIN, LEFT 10/23/2009  . Arthritis   . Asthma   . ASTHMA 05/23/2007  . Back pain   . Cardiomyopathy ([place]) 09/08/2015  . Chest pain   . CHF (congestive heart failure)  ([place])   . Chronic diastolic heart failure ([place]) 01/22/2020  . Constipation   . CONTACT DERMATITIS 06/09/2009  . Diabetes mellitus without complication ([place])    type II  . Dyspnea    allergies-dust, cig smoke, rag weeds  . ENDOMETRIOSIS NOS 05/23/2007  . FACIAL PAIN 06/02/2010  . Food allergy   . FREQUENCY, URINARY 05/17/2010  . GERD (gastroesophageal reflux disease)   . GLUCOSE INTOLERANCE 08/28/2009  . HERPES SIMPLEX, UNCOMPLICATED 4/00/8676  . Hyperlipidemia 08/28/2015  . HYPERTHYROIDISM 05/23/2007  . Hypoactive thyroid   . HYPOTHYROIDISM 05/09/2008  . Impaired glucose tolerance 01/28/2011  . INSOMNIA, HX OF 05/23/2007  . Joint pain   . LATERAL EPICONDYLITIS, RIGHT 03/10/2009  . LIPOMA 10/22/2010  . NECK PAIN 11/10/2010  . OBESITY 05/23/2007  . Osteopenia 09/28/2018  . Plantar fasciitis    Both feet  . Pre-diabetes   . SHOULDER PAIN, LEFT 05/17/2010  . SINUSITIS- ACUTE-NOS 11/03/2008  . SKIN LESION 11/10/2010  . Sleep apnea   . Stage III chronic kidney disease    Stage III  . Unspecified Chest Pain 07/25/2008  . UNSPECIFIED URTICARIA 06/04/2009  . URI 08/28/2009  . UTI 04/29/2008  . VITAMIN D DEFICIENCY 08/28/2009  . Wheezing 07/12/2010   Past Surgical History:  Procedure Laterality Date  . COLONOSCOPY    . ENDOMETRIAL ABLATION    . LAMINECTOMY  1996  . LAPAROTOMY     exploratory  . lower back surgery  07/06/2017   cyst  . POLYPECTOMY    .  s/p endometrial ablation  12/06  . TUBAL LIGATION    . UTERINE FIBROID SURGERY    . WISDOM TOOTH EXTRACTION     Social History   Socioeconomic History  . Marital status: Divorced    Spouse name: Not on file  . Number of children: 3  . Years of education: Not on file  . Highest education level: Not on file  Occupational History  . Occupation: SOCIAL WORKER    Employer: [employer]    Comment: [place]  Tobacco Use  . Smoking status: Former Smoker    Packs/day: 0.50    Years: 12.00    Pack years: 6.00    Types:  Cigarettes    Quit date: 11/05/1987    Years since quitting: 32.4  . Smokeless tobacco: Never Used  Vaping Use  . Vaping Use: Never used  Substance and Sexual Activity  . Alcohol use: Hansen    Alcohol/week: 1.0 standard drink    Types: 1 Glasses of wine per week    Comment: WINE  . Drug use: Hansen  . Sexual activity: Not on file  Other Topics Concern  . Not on file  Social History Narrative  . Not on file   Social Determinants of Health   Financial Resource Strain:   . Difficulty of Paying Living Expenses:   Food Insecurity:   . Worried About Charity fundraiser in the Last Year:   . Arboriculturist in the Last Year:   Transportation Needs:   . Film/video editor (Medical):   [name] Lack of Transportation (Non-Medical):   Physical Activity:   . Days of Exercise per Week:   . Minutes of Exercise per Session:   Stress:   . Feeling of Stress :   Social Connections:   . Frequency of Communication with Friends and Family:   . Frequency of Social Gatherings with Friends and Family:   . Attends Religious Services:   . Active Member of Clubs or Organizations:   . Attends Archivist Meetings:   [name] Marital Status:    Allergies  Allergen Reactions  . Shellfish Allergy Anaphylaxis  . Clarithromycin Nausea And Vomiting  . Latex Itching and Other (See Comments)    ITCHING AND COLD SORES AROUND MOUTH WHEN LATEX GLOVES USED BY THE DENTIST/HYGENTIST  . Metronidazole Nausea And Vomiting  . Zostavax [Zoster Vaccine Live]     Arm swelled  . Adhesive [Tape] Dermatitis    Plastic tape   . Lisinopril Cough  . Peanut Oil     Pt says it's ok   Family History  Problem Relation Age of Onset  . Diabetes Cousin   . Hypertension Cousin   . Heart disease Cousin   . Heart disease Mother        Rheumatic heart disease  . Depression Mother   . COPD Father   . Hypertension Father   . Cancer Maternal Grandmother        Breast  . Diabetes Sister   . Kidney disease Sister      Current Outpatient Medications (Endocrine & Metabolic):  .  levothyroxine (SYNTHROID) 112 MCG tablet, Take 1 tablet (112 mcg total) by mouth daily before breakfast. Annual appt due in June w/labs must see provider for future refills  Current Outpatient Medications (Cardiovascular):  .  atorvastatin (LIPITOR) 20 MG tablet, Take 1 tablet (20 mg total) by mouth daily. [name]  losartan (COZAAR) 25 MG tablet, Take 12.5 mg by mouth  daily.  .  torsemide (DEMADEX) 20 MG tablet, TAKE 2 TABLETS IN THE MORNING AND 1 IN THE EVENING. OK TO TAKE AN EXTRA DAILY AS NEEDED FOR WEIGHT GAIN  Current Outpatient Medications (Respiratory):  .  albuterol (VENTOLIN HFA) 108 (90 Base) MCG/ACT inhaler, Inhale 2 puffs into the lungs every 4 (four) hours as needed for wheezing or shortness of breath. .  budesonide-formoterol (SYMBICORT) 160-4.5 MCG/ACT inhaler, Inhale 2 puffs into the lungs 2 (two) times daily. .  fexofenadine (ALLEGRA) 180 MG tablet, Take 180 mg by mouth daily.   .  fluticasone (FLONASE) 50 MCG/ACT nasal spray, Place 2 sprays into both nostrils daily. (Patient taking differently: Place 2 sprays into both nostrils daily as needed for allergies. ) .  guaiFENesin (MUCINEX) 600 MG 12 hr tablet, Take 2 tablets (1,200 mg total) by mouth 2 (two) times daily as needed for to loosen phlegm.  Current Outpatient Medications (Analgesics):  .  allopurinol (ZYLOPRIM) 100 MG tablet, Take 2 tablets (200 mg total) by mouth daily. Take 200 mg by mouth daily. [name]  aspirin 81 MG EC tablet, Take 81 mg by mouth daily.   .  traMADol (ULTRAM) 50 MG tablet, Take 1 tablet (50 mg total) by mouth every 6 (six) hours as needed. (Patient taking differently: Take 50 mg by mouth every 6 (six) hours as needed (pain). )   Current Outpatient Medications (Other):  [name]  buPROPion (WELLBUTRIN SR) 150 MG 12 hr tablet, Take 1 tablet (150 mg total) by mouth daily. .  cyclobenzaprine (FLEXERIL) 10 MG tablet, Take 1 tablet (10 mg total) by mouth at  bedtime as needed for muscle spasms. .  Diclofenac Sodium 2 % SOLN, Place 2 g onto the skin 2 (two) times daily. .  fluconazole (DIFLUCAN) 150 MG tablet, 1 tab by mouth every 3 days as needed .  gabapentin (NEURONTIN) 100 MG capsule, Take 100 mg by mouth 2 (two) times daily. [name]  KLOR-CON M20 20 MEQ tablet, Take 1 tablet (20 mEq total) by mouth daily. OK TO TAKE AN EXTRA ON DAYS YOU TAKE EXTRA TORSEMIDE .  Menthol-Methyl Salicylate (MUSCLE RUB) 10-15 % CREA, Apply 1 application topically as needed for muscle pain. .  Multiple Vitamin (MULTIVITAMIN) capsule, Take 1 capsule by mouth daily.   [name]  omeprazole (PRILOSEC) 20 MG capsule, Take 20 mg by mouth daily as needed (acid reflux).  .  valACYclovir (VALTREX) 1000 MG tablet, Take 500 mg by mouth daily as needed (cold sores).  .  Vilazodone HCl (VIIBRYD) 10 MG TABS, Take 1 tablet (10 mg total) by mouth daily.   Reviewed prior external information including notes and imaging from  primary care provider As well as notes that were available from care everywhere and other healthcare systems.  Past medical history, social, surgical and family history all reviewed in electronic medical record.  Hansen pertanent information unless stated regarding to the chief complaint.   Review of Systems:  Hansen headache, visual changes, nausea, vomiting, diarrhea, constipation, dizziness, abdominal pain, skin rash, fevers, chills, night sweats, weight loss, swollen lymph nodes,  chest pain, shortness of breath, mood changes. POSITIVE muscle aches, body aches, joint swelling  Objective  Blood pressure 118/60, pulse 74, height 5' 4.5" (1.638 m), weight 247 lb (112 kg), SpO2 99 %.   General: Hansen apparent distress alert and oriented x3 mood and affect normal, dressed appropriately.  HEENT: Pupils equal, extraocular movements intact  Respiratory: Patient's speak in full sentences and does not appear short of breath  Cardiovascular: Hansen lower extremity edema, non tender, Hansen  erythema  Neuro: Cranial nerves II through XII are intact, neurovascularly intact in all extremities with 2+ DTRs and 2+ pulses.  Gait antalgic gait MSK: Knee: bilateral  valgus deformity noted. Abnormal thigh to calf ratio.  Tender to palpation over medial and PF joint line.  ROM full in flexion and extension and lower leg rotation. instability with valgus force.  painful patellar compression. Patellar glide with moderate crepitus. Patellar and quadriceps tendons unremarkable. Hamstring and quadriceps strength is normal.   After informed written and verbal consent, patient was seated on exam table. Right knee was prepped with alcohol swab and utilizing anterolateral approach, patient's right knee space was injected with 4:1  marcaine 0.5%: Kenalog 40mg /dL. Patient tolerated the procedure well without immediate complications.  After informed written and verbal consent, patient was seated on exam table. Left knee was prepped with alcohol swab and utilizing anterolateral approach, patient's left knee space was injected with 4:1  marcaine 0.5%: Kenalog 40mg /dL. Patient tolerated the procedure well without immediate complications.    Impression and Recommendations:     The above documentation has been reviewed and is accurate and complete Lyndal Pulley, DO       Note: This dictation was prepared with Dragon dictation along with smaller phrase technology. Any transcriptional errors that result from this process are unintentional.

## 2020-03-23 NOTE — Patient Instructions (Addendum)
Iron 50mg  daily Vit C 500mg  daily Gluconate  Vilazodone Injected knees today See me again in 6 weeks

## 2020-03-24 ENCOUNTER — Encounter: Payer: Self-pay | Admitting: Family Medicine

## 2020-03-24 ENCOUNTER — Other Ambulatory Visit: Payer: Self-pay | Admitting: Cardiovascular Disease

## 2020-03-24 DIAGNOSIS — Z6841 Body Mass Index (BMI) 40.0 and over, adult: Secondary | ICD-10-CM | POA: Diagnosis not present

## 2020-03-24 DIAGNOSIS — M47816 Spondylosis without myelopathy or radiculopathy, lumbar region: Secondary | ICD-10-CM | POA: Diagnosis not present

## 2020-03-24 DIAGNOSIS — I10 Essential (primary) hypertension: Secondary | ICD-10-CM | POA: Diagnosis not present

## 2020-03-24 NOTE — Telephone Encounter (Signed)
*  STAT* If patient is at the pharmacy, call can be transferred to refill team.   1. Which medications need to be refilled? (please list name of each medication and dose if known) a new prescription for Losartan  2. Which pharmacy/location (including street and city if local pharmacy) is medication to be sent to? [place], [place] 3. Do they need a 30 day or 90 day supply?  90 days and refills

## 2020-03-24 NOTE — Assessment & Plan Note (Signed)
Chronic problem with exacerbation.  Patient does give pain medication from another provider.  Responds well to the steroid injection still at this time.  Patient wants to avoid surgical intervention.  Patient will continue to work on weight loss.  Discussed icing regimen and home exercises.  Brought up the idea of aquatic therapy which patient declined at the moment.  Follow-up again in 4 to 8 weeks

## 2020-03-24 NOTE — Assessment & Plan Note (Addendum)
Following up with neurosurgeon. vilazodone

## 2020-03-25 MED ORDER — LOSARTAN POTASSIUM 25 MG PO TABS: 12.5000 mg | ORAL_TABLET | Freq: Every day | ORAL | 2 refills | Status: DC

## 2020-04-15 ENCOUNTER — Telehealth: Payer: Self-pay | Admitting: *Deleted

## 2020-04-15 NOTE — Telephone Encounter (Signed)
Patient was initially referred for PREP at [facility] in [place] and this was not a good fit location for her as it was going to involve much travel time for her. She was referred to me via [place] for referral to the [facility] in [place]. After many attempts, finally reached patient (contact # was incorrect in chart). [facility] referral was processed and signed and approved by Dr. Oval Linsey. Per [facility], she was expected for an orientation on Tuesday, July 6th. She has been instructed document and report pre and post BP's to Dr. Oval Linsey at following appointments. I contacted patient to close the loop for referral. Left voicemail for her to return call if there were any questions or concerns.     Landis Martins, MS, ACSM, [facility] Clinical Exercise Physiologist/ Health and Wellness Coach

## 2020-04-17 ENCOUNTER — Other Ambulatory Visit: Payer: Self-pay | Admitting: Internal Medicine

## 2020-04-17 NOTE — Telephone Encounter (Signed)
Please refill as per office routine med refill policy (all routine meds refilled for 3 mo or monthly per pt preference up to one year from last visit, then month to month grace period for 3 mo, then further med refills will have to be denied)  

## 2020-04-18 ENCOUNTER — Other Ambulatory Visit: Payer: Self-pay | Admitting: Internal Medicine

## 2020-04-21 ENCOUNTER — Ambulatory Visit: Payer: BC Managed Care – PPO | Admitting: Cardiovascular Disease

## 2020-04-21 ENCOUNTER — Other Ambulatory Visit: Payer: Self-pay

## 2020-04-21 ENCOUNTER — Encounter: Payer: Self-pay | Admitting: Cardiovascular Disease

## 2020-04-21 VITALS — BP 134/70 | HR 79 | Temp 97.9°F | Ht 64.5 in | Wt 241.0 lb

## 2020-04-21 DIAGNOSIS — I10 Essential (primary) hypertension: Secondary | ICD-10-CM | POA: Diagnosis not present

## 2020-04-21 DIAGNOSIS — I480 Paroxysmal atrial fibrillation: Secondary | ICD-10-CM | POA: Diagnosis not present

## 2020-04-21 DIAGNOSIS — I5032 Chronic diastolic (congestive) heart failure: Secondary | ICD-10-CM

## 2020-04-21 NOTE — Progress Notes (Signed)
Cardiology Office Note   Date:  05/03/2020   ID:  Suzanne Hansen, Suzanne Hansen [birth year], MRN [id number]  PCP:  Biagio Borg, MD  Cardiologist:   Skeet Latch, MD   No chief complaint on file.    History of Present Illness: Suzanne Hansen is a 64 y.o. female paroxysmal atrial fibrillation, chronic systolic and diastolic heart failure, CKD III, OSA, diabetes, prior tobacco use, and hypothyroidism who is being seen today to establish care.  She was Hansen seen in 2016 when she is diagnosed with acute systolic and diastolic heart failure.  Prior to that she had a stress Myoview 09/2013 that revealed LVEF 48% and no ischemia.  She was followed by her primary care physician and switch from hydrochlorothiazide to Lasix.  She had no symptoms at the time she Hansen saw Dr. Angelena Form in 2016.  She was started on carvedilol and lisinopril.    Lisinopril was subsequently converted to losartan due to cough.  Her symptoms were felt to be nonischemic.  She notes that she was under a lot of stress at the time.  She subsequently transferred her care to [facility] with Dr. Kirtland Bouchard.  He underwent CPX testing in 2018 and was found to have moderate cardiac limitation and deconditioning.  Echo 01/2017 revealed LVEF 50-55%.  Her most recent echo 05/2018 revealed LVEF 55 to 60% with grade 1 diastolic dysfunction.  She reportedly has a history of atrial fibrillation that occurred in the setting of her initial heart failure symptoms.  She has not had any recurrence and has never been on anticoagulation.  She was symptomatic in atrial fibrillation.  Suzanne Hansen had a spinal fusion in December 2020 and has difficulty driving now so she wanted to establish care in [place].  Her pain has improved after the fusion but she still has chronic pain.  She had an episode of chest pain last week.  However she had recently started Cymbalta and read that it can cause chest pain.  She stopped it and this alleviated both the chest pain and some  tremors that she had developed.  She is disappointed because he did a good job with helping her back pain.  She never had exertion pain.  She recently saw a new nephrologist and her diuretics were reduced.  She was taking NSAIDs and these were also discontinued.  Her renal function has subsequently normalized.  She has not had any increase in her edema since this time.  Her cramps have also been better.  After her surgery she had some issues with bladder incontinence and lowering the dose of her diuretic seems to help this as well.  She denies orthopnea or PND.  She has a history of sleep apnea but did not tolerate CPAP.  She continues to have a lot of pain and uses Tylenol and gabapentin.  This limits her ability to exercise.  After her back surgery she initially did physical therapy but stopped it because of the high co-pay.  She has difficulty navigating steps but does have steps in her home.  She started in healthy weight and wellness clinic with was unable to continue because of arriving to the program week.  Lately she has been feeling well.  She has chronic joint pain.  She struggles to exercise because of this.  She has been enrolled in the [place] exercise program at [facility].  She was schedule to start last week but couldn't because her aunt passed away.  She plans to start soon.  She notes that her weight and swelling have been stable.  She has no orthopnea or PND.  She doesn't sleep well without her CPAP.  She is trying to watch her diet and eat healthy.  Overall she is stable from a cardiac standpoint.  She brings a log of her BP which shows her BP has been in the 100-120s/60-80s.  Her HR 100s 2//2 pain.    Past Medical History:  Diagnosis Date   ALLERGIC RHINITIS 05/09/2008   Allergy    ANXIETY 05/23/2007   ARM PAIN, LEFT 10/23/2009   Arthritis    Asthma    ASTHMA 05/23/2007   Back pain    Cardiomyopathy ([place]) 09/08/2015   Chest pain    CHF (congestive heart failure) (HCC)     Chronic diastolic heart failure ([place]) 01/22/2020   Constipation    CONTACT DERMATITIS 06/09/2009   Diabetes mellitus without complication ([place])    type II   Dyspnea    allergies-dust, cig smoke, rag weeds   ENDOMETRIOSIS NOS 05/23/2007   FACIAL PAIN 06/02/2010   Food allergy    FREQUENCY, URINARY 05/17/2010   GERD (gastroesophageal reflux disease)    GLUCOSE INTOLERANCE 08/28/2009   HERPES SIMPLEX, UNCOMPLICATED 01/16/8118   Hyperlipidemia 08/28/2015   HYPERTHYROIDISM 05/23/2007   Hypoactive thyroid    HYPOTHYROIDISM 05/09/2008   Impaired glucose tolerance 01/28/2011   INSOMNIA, HX OF 05/23/2007   Joint pain    LATERAL EPICONDYLITIS, RIGHT 03/10/2009   LIPOMA 10/22/2010   NECK PAIN 11/10/2010   OBESITY 05/23/2007   Osteopenia 09/28/2018   Plantar fasciitis    Both feet   Pre-diabetes    SHOULDER PAIN, LEFT 05/17/2010   SINUSITIS- ACUTE-NOS 11/03/2008   SKIN LESION 11/10/2010   Sleep apnea    Stage III chronic kidney disease    Stage III   Unspecified Chest Pain 07/25/2008   UNSPECIFIED URTICARIA 06/04/2009   URI 08/28/2009   UTI 04/29/2008   VITAMIN D DEFICIENCY 08/28/2009   Wheezing 07/12/2010    Past Surgical History:  Procedure Laterality Date   COLONOSCOPY     ENDOMETRIAL ABLATION     LAMINECTOMY  1996   LAPAROTOMY     exploratory   lower back surgery  07/06/2017   cyst   POLYPECTOMY     s/p endometrial ablation  12/06   TUBAL LIGATION     UTERINE FIBROID SURGERY     WISDOM TOOTH EXTRACTION      Current Outpatient Medications  Medication Sig Dispense Refill   acetaminophen (TYLENOL) 650 MG CR tablet Take by mouth.     allopurinol (ZYLOPRIM) 100 MG tablet Take 2 tablets (200 mg total) by mouth daily. Take 200 mg by mouth daily. 180 tablet 3   aspirin 81 MG EC tablet Take 81 mg by mouth daily.       atorvastatin (LIPITOR) 20 MG tablet Take 1 tablet (20 mg total) by mouth daily. Annual appt w/labs are due must see provider for  future refills 30 tablet 0   budesonide-formoterol (SYMBICORT) 160-4.5 MCG/ACT inhaler Inhale 2 puffs into the lungs 2 (two) times daily. 10.2 g 6   budesonide-formoterol (SYMBICORT) 160-4.5 MCG/ACT inhaler Symbicort 160 mcg-4.5 mcg/actuation HFA aerosol inhaler  INHALE 2 PUFFS INTO THE LUNGS TWICE DAILY     buPROPion (WELLBUTRIN SR) 150 MG 12 hr tablet Take 1 tablet (150 mg total) by mouth daily. 30 tablet 0   buPROPion (WELLBUTRIN XL) 150 MG 24 hr tablet Take 150 mg by mouth daily.  Cholecalciferol 25 MCG (1000 UT) tablet Take by mouth.     Diclofenac Sodium 2 % SOLN Place 2 g onto the skin 2 (two) times daily. 112 g 3   ergocalciferol (VITAMIN D2) 1.25 MG (50000 UT) capsule ergocalciferol (vitamin D2) 1,250 mcg (50,000 unit) capsule     fexofenadine (ALLEGRA) 180 MG tablet Take 180 mg by mouth daily.       fluconazole (DIFLUCAN) 150 MG tablet 1 tab by mouth every 3 days as needed 2 tablet 1   fluticasone (FLONASE) 50 MCG/ACT nasal spray Place 2 sprays into both nostrils daily. (Patient taking differently: Place 2 sprays into both nostrils daily as needed for allergies. ) 16 g 6   gabapentin (NEURONTIN) 100 MG capsule Take 100 mg by mouth 2 (two) times daily.     gabapentin (NEURONTIN) 300 MG capsule Take 300 mg by mouth 3 (three) times daily.     guaiFENesin (MUCINEX) 600 MG 12 hr tablet Take 2 tablets (1,200 mg total) by mouth 2 (two) times daily as needed for to loosen phlegm. 60 tablet 1   KLOR-CON M20 20 MEQ tablet Take 1 tablet (20 mEq total) by mouth daily. OK TO TAKE AN EXTRA ON DAYS YOU TAKE EXTRA TORSEMIDE 70 tablet 5   levothyroxine (SYNTHROID) 112 MCG tablet Take 1 tablet (112 mcg total) by mouth daily before breakfast. Annual appt is due must see provider for future refills 30 tablet 0   losartan (COZAAR) 25 MG tablet Take 0.5 tablets (12.5 mg total) by mouth daily. 45 tablet 2   Menthol-Methyl Salicylate (MUSCLE RUB) 10-15 % CREA Apply 1 application topically as  needed for muscle pain.     Multiple Vitamin (MULTIVITAMIN) capsule Take 1 capsule by mouth daily.       omeprazole (PRILOSEC) 20 MG capsule Take 20 mg by mouth daily as needed (acid reflux).      torsemide (DEMADEX) 20 MG tablet TAKE 2 TABLETS IN THE MORNING AND 1 IN THE EVENING. OK TO TAKE AN EXTRA DAILY AS NEEDED FOR WEIGHT GAIN 60 tablet 5   traMADol (ULTRAM) 50 MG tablet Take 1 tablet (50 mg total) by mouth every 6 (six) hours as needed. (Patient taking differently: Take 50 mg by mouth every 6 (six) hours as needed (pain). ) 30 tablet 0   valACYclovir (VALTREX) 1000 MG tablet Take 500 mg by mouth daily as needed (cold sores).   0   Vilazodone HCl (VIIBRYD) 10 MG TABS Take 1 tablet (10 mg total) by mouth daily. 30 tablet 0   No current facility-administered medications for this visit.    Allergies:   Shellfish allergy, Clarithromycin, Latex, Metronidazole, Zostavax [zoster vaccine live], Adhesive [tape], Lisinopril, Other, and Peanut oil    Social History:  The patient  reports that she quit smoking about 32 years ago. Her smoking use included cigarettes. She has a 6.00 pack-year smoking history. She has never used smokeless tobacco. She reports that she does not drink alcohol and does not use drugs.   Family History:  The patient's family history includes COPD in her father; Cancer in her maternal grandmother; Depression in her mother; Diabetes in her cousin and sister; Heart disease in her cousin and mother; Hypertension in her cousin and father; Kidney disease in her sister.    ROS:  Please see the history of present illness.   Otherwise, review of systems are positive for none.   All other systems are reviewed and negative.    PHYSICAL EXAM: VS:  BP 134/70    Pulse 79    Temp 97.9 F (36.6 C)    Ht 5' 4.5" (1.638 m)    Wt 241 lb (109.3 kg)    SpO2 93%    BMI 40.73 kg/m  , BMI Body mass index is 40.73 kg/m. GENERAL:  Well appearing HEENT:  Pupils equal round and reactive,  fundi not visualized, oral mucosa unremarkable NECK:  No jugular venous distention, waveform within normal limits, carotid upstroke brisk and symmetric, no bruits, no thyromegaly LYMPHATICS:  No cervical adenopathy LUNGS:  Clear to auscultation bilaterally HEART:  RRR.  PMI not displaced or sustained,S1 and S2 within normal limits, no S3, no S4, no clicks, no rubs, no murmurs ABD:  Flat, positive bowel sounds normal in frequency in pitch, no bruits, no rebound, no guarding, no midline pulsatile mass, no hepatomegaly, no splenomegaly EXT:  2 plus pulses throughout, trace edema, no cyanosis no clubbing SKIN:  No rashes no nodules NEURO:  Cranial nerves II through XII grossly intact, motor grossly intact throughout PSYCH:  Cognitively intact, oriented to person place and time  EKG:  EKG is ordered today. The ekg ordered 01/22/20 demonstrates sinus rhythm.  Rate 74 bpm.  Low voltage.   04/21/20: Sinus rhythm.  Rate 79 bpm.  Prior inferior infarct.   Recent Labs: 10/02/2019: ALT 18; BUN 18; Creatinine, Ser 1.18; Hemoglobin 11.6; Platelets 404.0; Potassium 4.2; Sodium 139; TSH 0.67    Lipid Panel    Component Value Date/Time   CHOL 132 10/02/2019 1532   CHOL 148 07/18/2019 0900   TRIG 75.0 10/02/2019 1532   HDL 69.20 10/02/2019 1532   HDL 83 07/18/2019 0900   CHOLHDL 2 10/02/2019 1532   VLDL 15.0 10/02/2019 1532   LDLCALC 48 10/02/2019 1532   LDLCALC 50 07/18/2019 0900      Wt Readings from Last 3 Encounters:  04/21/20 241 lb (109.3 kg)  03/23/20 247 lb (112 kg)  02/25/20 245 lb (111.1 kg)      ASSESSMENT AND PLAN:  # Chronic diastolic heart failure: # Essential hypertension:  Previously had systolic dysfunction that has subsequently improved.  She has nonischemic cardiomyopathy.  She never had a cath.  Stress testing has been negative for ischemia.  Volume status stable and renal function stable.  Continue torsemide and losartan.  BP has been stable.  # CKD II: Renal function  has improved.  Her creatinine is essentially normal.  Continue to monitor closely.  Continue to avoid NSAIDs.  # Hyperlipidemia: Continue atorvastatin.  LDL 43 on 09/2019.  # OSA: Continue using CPAP.  # Obesity:  Encouraged to continue exercise program.     Current medicines are reviewed at length with the patient today.  The patient does not have concerns regarding medicines.  The following changes have been made:  no change  Labs/ tests ordered today include:  No orders of the defined types were placed in this encounter.    Disposition:   FU with Camary Sosa C. Oval Linsey, MD, [facility] in 6 months.    Signed, Jebediah Macrae C. Oval Linsey, MD, [facility]  05/03/2020 9:28 AM    [place]

## 2020-04-21 NOTE — Patient Instructions (Signed)
Medication Instructions:  Your physician recommends that you continue on your current medications as directed. Please refer to the Current Medication list given to you today.  *If you need a refill on your cardiac medications before your next appointment, please call your pharmacy*   Lab Work: NONE   Testing/Procedures: NONE    Follow-Up: At [facility], you and your health needs are our priority.  As part of our continuing mission to provide you with exceptional heart care, we have created designated Provider Care Teams.  These Care Teams include your primary Cardiologist (physician) and Advanced Practice Providers (APPs -  Physician Assistants and Nurse Practitioners) who all work together to provide you with the care you need, when you need it.  We recommend signing up for the patient portal called "MyChart".  Sign up information is provided on this After Visit Summary.  MyChart is used to connect with patients for Virtual Visits (Telemedicine).  Patients are able to view lab/test results, encounter notes, upcoming appointments, etc.  Non-urgent messages can be sent to your provider as well.   To learn more about what you can do with MyChart, go to [email or web address].    Your next appointment:   6 month(s)  The format for your next appointment:   In Person  Provider:   You may see DR [place]  or one of the following Advanced Practice Providers on your designated Care Team:    Luke Kilroy, PA-C  Callie Goodrich, PA-C  Jesse Cleaver, FNP    

## 2020-05-04 ENCOUNTER — Ambulatory Visit: Payer: BC Managed Care – PPO | Admitting: Family Medicine

## 2020-05-04 DIAGNOSIS — N1831 Chronic kidney disease, stage 3a: Secondary | ICD-10-CM | POA: Diagnosis not present

## 2020-05-06 ENCOUNTER — Other Ambulatory Visit: Payer: Self-pay | Admitting: Family Medicine

## 2020-05-13 ENCOUNTER — Ambulatory Visit: Payer: BC Managed Care – PPO | Admitting: Neurology

## 2020-05-13 ENCOUNTER — Encounter: Payer: Self-pay | Admitting: Neurology

## 2020-05-13 VITALS — BP 128/76 | HR 87 | Ht 64.5 in | Wt 244.0 lb

## 2020-05-13 DIAGNOSIS — I5032 Chronic diastolic (congestive) heart failure: Secondary | ICD-10-CM

## 2020-05-13 DIAGNOSIS — G4710 Hypersomnia, unspecified: Secondary | ICD-10-CM

## 2020-05-13 DIAGNOSIS — R413 Other amnesia: Secondary | ICD-10-CM | POA: Insufficient documentation

## 2020-05-13 DIAGNOSIS — G4730 Sleep apnea, unspecified: Secondary | ICD-10-CM

## 2020-05-13 DIAGNOSIS — I480 Paroxysmal atrial fibrillation: Secondary | ICD-10-CM

## 2020-05-13 DIAGNOSIS — I70212 Atherosclerosis of native arteries of extremities with intermittent claudication, left leg: Secondary | ICD-10-CM | POA: Diagnosis not present

## 2020-05-13 DIAGNOSIS — M2550 Pain in unspecified joint: Secondary | ICD-10-CM

## 2020-05-13 NOTE — Progress Notes (Signed)
PATIENT: Suzanne Hansen DOB: 09-Dec-1955  REASON FOR VISIT: follow up HISTORY FROM: patient  Chief Complaint  Patient presents with  . Follow-up    pt alone, rm 10. states things are well. she has been noticing some new concerns related to memory and getting words out. she is using the machine DME [employer])     HISTORY OF PRESENT ILLNESS: Today 05/13/20  Suzanne Hansen is a 64 y.o. female here today for follow up for OSA on CPAP.  Epworth sleepiness scale: 6 / 24 , FSS at 28/63  Points.   Poor compliance with CPAP- due to insomnia - "I am up at 3 AM and sometimes that's the end of the night" And " When I read in bed I will always fall asleep- and I forget to put the CPAP on many evenings".   Spine fusion in December has reduced the pain. Dr Saintclair Halsted.  CPAP supplies not longer paid for by insurance. [employer].  She is now Counselling psychologist. Nasal ;pillow replacement.      AmyLomax, NP :  She returns today for initial compliance report.  She is doing very well and notes improvement in energy and less fatigue during the day.  Compliance report dated 04/09/2019 through 05/08/2019 reveals that she is using CPAP every day for compliance of 100%.  28 out of the last 30 days she used her machine greater than 4 hours for compliance of 93%.  AHI was 2.8 on 16 cm of water and EPR of 3.  There was no significant leak.  She is doing very well today and without concerns.  HISTORY: (copied from Dr Sharlynn Seckinger's note on 09/18/2018)  Suzanne Hansen is a 64 y.o. female , she was originally seen in May 2017 seen here as a referral from Dentist DR Bennett Scrape, DMD at [place].  I have the pleasure of seeing Suzanne Hansen today in a revisit, Almost 3 years after his encounter at her last.  Today is 18 September 2018 and Suzanne Hansen reports that her cardiologist [name], DO at [facility] in [place] wants her to be reevaluated for sleep apnea.  The patient had been evaluated in an  attended split-night polysomnography on 29 Feb 2016 at the time she had a baseline AHI of 17.3 mostly hypercapnia related, REM AHI was 52.6 she did not have oxygen desaturations of significance.  She was titrated to CPAP at 9 cmH2O the AHI became 0.0 at that setting and she tolerated supine positional sleep without further apneas, she also had a significant amount of REM sleep rebounding.  The patient started CPAP late May 2017 at home but she contracted frequent upper respiratory infections, sinusitis and she suffers from a easily congested nose and sinus symptoms.  She felt that CPAP was not comfortable for her to wear and I had never seen her after the trial of CPAP and she decided to go with a dental appliance instead.  Her daughter now tells her that the dental appliance has not eliminated her snoring, as she could hear her mother snore.  Her heart failure specialist is also concerned that the dental appliance does nothing to treat the underlying REM dependent apnea, which is true.  Her family has noticed that she continues to have apneas while using the mouthpiece. The mouth piece cannot be adjusted ay further.    I have also the opportunity to read her cardiologist notes, [name], according to the visit notes Mrs. Flannery is described  as a 64 year old female with obesity, with prior mild systolic heart failure but now normal ejection fraction after a right heart catheterization, paroxysmal atrial fibrillation.  Longstanding dyspnea and chest pain she has been on diuretics for several years.  Left ejection fraction had been 40 to 50% since 2014 or longer she is intolerant of lisinopril to the cough, she is not on anticoagulation for paroxysmal atrial fibrillation.  She is a borderline diabetic she is on statin for high lipids, she uses a mouth appliance for sleep apnea.  She was seen in November 2018 for chest pain had a negative stress test was given Lasix in the ED and had not taken her diuretics at  home that day.  She received a steroid injection for hip pain, she had no improvement after a nerve ablation but finally found some improvement after the cyst was removed from the lumbar spine, she has an appointment with nephrology based on her cardiology referral.  Her white blood cell count had been 11.3 hemoglobin 12.4 hematocrit 35.9, AST 24, AST 20 sodium 139 potassium 3.8 in August 2019, creatinine was 1.75 which is elevated on May 17, 2018.  BUN was high at 29.  Nephrology visit at [facility] was in 3rd October 2019, the patient was started on allopurinol.    Suzanne Hansen presents with Asthma, recurrent bronchitis, sinusitis,  hypothyroidism and weight gain ( BMI is now 37), and a  diagnosis of cardiomyopathy. Her cardiomyopathy progressed into heart failure, CKD stage 3 , and she developed leg edema. She knows she snores, because her throat is sore and her mouth is dry in AM, her daughters tell her she stops breathing and snores loudly.   Sleep habits are as follows: She usually returns home after work feeling almost ready to go to bed. She will be on her comfort close by 7 or 8 PM average bedtime is around 10 PM. She sleeps on one pillow, does not elevate the head of bed. She falls asleep on her side, but she wakes up she is often on her back. She usually falls asleep easily and she feels that she sleeps through the night some nights, others she will just wake up for no reason.  Sometimes she will gasp for air or seemingly snore herself awake,( mostly when falling asleep in a chair). She will have three  bathroom breaks at night on average, rarely 4-5 . Wakes up spontaneously at about 6.00 AM but she needs to be up by 7.AM.  No naps in daytime- work is busy. She may doze when not physically active or mentally stimulated and she does often feel sleepier when she drives, helping herself to some caffeine on the way.  Sleep and additional medical history :As a child she used to sleep with a night  light on, he sometimes eating a big light. She had some nightmares the usual pre-school or fear of monsters. She has no history of sleepwalking so of true night terrors , but of enuresis.  She had a cyst removed from her spine 06-2017, and since her left leg bothers her "not as much".   Social history: She is divorced after 25 years of marriage, sleeps alone with her dog.  Adult  daughters, one married, one a Conservation officer, historic buildings, and the youngest one finishes her masters in education.  The youngest lives with her.  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 Caffeine - one cup in AM and rarely uses caffeine -  ETOH rare, less than one a week.  Tobacco use- in college  Sept 1977, and stopped with her first pregnancy - 5 cig a day.   REVIEW OF SYSTEMS: Out of a complete 14 system review of symptoms, the patient complains only of the following symptoms, memory loss, headache and all other reviewed systems are negative.  Epworth sleepiness scale: 6 / 24 , FSS at 28/63  Points.   insomna - "iam up at 3 AM and sometimes that's the end of the night" When I read in bed I will always fall asleep- and I forget to put the CPAP on many evenings.  Spine fusion in December has reduced the pain. Dr Saintclair Halsted.   CPAP supplies not longer paid for by insurance. [employer]BS.   She is now Counselling psychologist. Nasal ;pillow replacement.    ALLERGIES: Allergies  Allergen Reactions  . Shellfish Allergy Anaphylaxis  . Clarithromycin Nausea And Vomiting  . Latex Itching and Other (See Comments)    ITCHING AND COLD SORES AROUND MOUTH WHEN LATEX GLOVES USED BY THE DENTIST/HYGENTIST  . Metronidazole Nausea And Vomiting  . Zostavax [Zoster Vaccine Live]     Arm swelled  . Adhesive [Tape] Dermatitis    Plastic tape   . Lisinopril Cough  . Other Dermatitis    Plastic tape   . Peanut Oil     Pt says it's ok    HOME MEDICATIONS: Outpatient Medications Prior to Visit  Medication Sig Dispense  Refill  . acetaminophen (TYLENOL) 650 MG CR tablet Take by mouth.    [name] allopurinol (ZYLOPRIM) 100 MG tablet Take 2 tablets (200 mg total) by mouth daily. Take 200 mg by mouth daily. 180 tablet 3  . aspirin 81 MG EC tablet Take 81 mg by mouth daily.      [name] atorvastatin (LIPITOR) 20 MG tablet Take 1 tablet (20 mg total) by mouth daily. Annual appt w/labs are due must see provider for future refills 30 tablet 0  . budesonide-formoterol (SYMBICORT) 160-4.5 MCG/ACT inhaler Inhale 2 puffs into the lungs 2 (two) times daily. 10.2 g 6  . buPROPion (WELLBUTRIN XL) 150 MG 24 hr tablet Take 150 mg by mouth daily.    . Cholecalciferol 25 MCG (1000 UT) tablet Take by mouth.    . ergocalciferol (VITAMIN D2) 1.25 MG (50000 UT) capsule ergocalciferol (vitamin D2) 1,250 mcg (50,000 unit) capsule    . fexofenadine (ALLEGRA) 180 MG tablet Take 180 mg by mouth daily.      . fluconazole (DIFLUCAN) 150 MG tablet 1 tab by mouth every 3 days as needed 2 tablet 1  . fluticasone (FLONASE) 50 MCG/ACT nasal spray Place 2 sprays into both nostrils daily. (Patient taking differently: Place 2 sprays into both nostrils daily as needed for allergies. ) 16 g 6  . gabapentin (NEURONTIN) 100 MG capsule Take 100 mg by mouth 2 (two) times daily.    [name] gabapentin (NEURONTIN) 300 MG capsule Take 300 mg by mouth 3 (three) times daily.    [name] guaiFENesin (MUCINEX) 600 MG 12 hr tablet Take 2 tablets (1,200 mg total) by mouth 2 (two) times daily as needed for to loosen phlegm. 60 tablet 1  . KLOR-CON M20 20 MEQ tablet Take 1 tablet (20 mEq total) by mouth daily. OK TO TAKE AN EXTRA ON DAYS YOU TAKE EXTRA TORSEMIDE 70 tablet 5  . levothyroxine (SYNTHROID) 112 MCG tablet Take 1 tablet (112 mcg total) by mouth daily before breakfast. Annual appt is due must see provider  for future refills 30 tablet 0  . losartan (COZAAR) 25 MG tablet Take 0.5 tablets (12.5 mg total) by mouth daily. 45 tablet 2  . Menthol-Methyl Salicylate (MUSCLE RUB) 10-15 % CREA  Apply 1 application topically as needed for muscle pain.    . Multiple Vitamin (MULTIVITAMIN) capsule Take 1 capsule by mouth daily.      [name] omeprazole (PRILOSEC) 20 MG capsule Take 20 mg by mouth daily as needed (acid reflux).     [name] PENNSAID 2 % SOLN APPLY 2 PUMPS (2 GRAMS) TO AFFECTED AREA TOPICALLY TWICE DAILY AS DIRECTED 112 g 2  . torsemide (DEMADEX) 20 MG tablet TAKE 2 TABLETS IN THE MORNING AND 1 IN THE EVENING. OK TO TAKE AN EXTRA DAILY AS NEEDED FOR WEIGHT GAIN 60 tablet 5  . traMADol (ULTRAM) 50 MG tablet Take 1 tablet (50 mg total) by mouth every 6 (six) hours as needed. (Patient taking differently: Take 50 mg by mouth every 6 (six) hours as needed (pain). ) 30 tablet 0  . valACYclovir (VALTREX) 1000 MG tablet Take 500 mg by mouth daily as needed (cold sores).   0  . Vilazodone HCl (VIIBRYD) 10 MG TABS Take 1 tablet (10 mg total) by mouth daily. 30 tablet 0  . budesonide-formoterol (SYMBICORT) 160-4.5 MCG/ACT inhaler Symbicort 160 mcg-4.5 mcg/actuation HFA aerosol inhaler  INHALE 2 PUFFS INTO THE LUNGS TWICE DAILY    . buPROPion (WELLBUTRIN SR) 150 MG 12 hr tablet Take 1 tablet (150 mg total) by mouth daily. 30 tablet 0   No facility-administered medications prior to visit.    PAST MEDICAL HISTORY: Past Medical History:  Diagnosis Date  . ALLERGIC RHINITIS 05/09/2008  . Allergy   . ANXIETY 05/23/2007  . ARM PAIN, LEFT 10/23/2009  . Arthritis   . Asthma   . ASTHMA 05/23/2007  . Back pain   . Cardiomyopathy ([place]) 09/08/2015  . Chest pain   . CHF (congestive heart failure) ([place])   . Chronic diastolic heart failure ([place]) 01/22/2020  . Constipation   . CONTACT DERMATITIS 06/09/2009  . Diabetes mellitus without complication ([place])    type II  . Dyspnea    allergies-dust, cig smoke, rag weeds  . ENDOMETRIOSIS NOS 05/23/2007  . FACIAL PAIN 06/02/2010  . Food allergy   . FREQUENCY, URINARY 05/17/2010  . GERD (gastroesophageal reflux disease)   . GLUCOSE INTOLERANCE 08/28/2009  .  HERPES SIMPLEX, UNCOMPLICATED 01/26/3789  . Hyperlipidemia 08/28/2015  . HYPERTHYROIDISM 05/23/2007  . Hypoactive thyroid   . HYPOTHYROIDISM 05/09/2008  . Impaired glucose tolerance 01/28/2011  . INSOMNIA, HX OF 05/23/2007  . Joint pain   . LATERAL EPICONDYLITIS, RIGHT 03/10/2009  . LIPOMA 10/22/2010  . NECK PAIN 11/10/2010  . OBESITY 05/23/2007  . Osteopenia 09/28/2018  . Plantar fasciitis    Both feet  . Pre-diabetes   . SHOULDER PAIN, LEFT 05/17/2010  . SINUSITIS- ACUTE-NOS 11/03/2008  . SKIN LESION 11/10/2010  . Sleep apnea   . Stage III chronic kidney disease    Stage III  . Unspecified Chest Pain 07/25/2008  . UNSPECIFIED URTICARIA 06/04/2009  . URI 08/28/2009  . UTI 04/29/2008  . VITAMIN D DEFICIENCY 08/28/2009  . Wheezing 07/12/2010    PAST SURGICAL HISTORY: Past Surgical History:  Procedure Laterality Date  . COLONOSCOPY    . ENDOMETRIAL ABLATION    . LAMINECTOMY  1996  . LAPAROTOMY     exploratory  . lower back surgery  07/06/2017   cyst  . POLYPECTOMY    .  s/p endometrial ablation  12/06  . TUBAL LIGATION    . UTERINE FIBROID SURGERY    . WISDOM TOOTH EXTRACTION      FAMILY HISTORY: Family History  Problem Relation Age of Onset  . Diabetes Cousin   . Hypertension Cousin   . Heart disease Cousin   . Heart disease Mother        Rheumatic heart disease  . Depression Mother   . COPD Father   . Hypertension Father   . Cancer Maternal Grandmother        Breast  . Diabetes Sister   . Kidney disease Sister     SOCIAL HISTORY: Social History   Socioeconomic History  . Marital status: Divorced    Spouse name: Not on file  . Number of children: 3  . Years of education: Not on file  . Highest education level: Not on file  Occupational History  . Occupation: SOCIAL WORKER    Employer: [employer]    Comment: [employer]  Tobacco Use  . Smoking status: Former Smoker    Packs/day: 0.50    Years: 12.00    Pack years: 6.00    Types: Cigarettes     Quit date: 11/05/1987    Years since quitting: 32.5  . Smokeless tobacco: Never Used  Vaping Use  . Vaping Use: Never used  Substance and Sexual Activity  . Alcohol use: No    Alcohol/week: 1.0 standard drink    Types: 1 Glasses of wine per week    Comment: WINE  . Drug use: No  . Sexual activity: Not on file  Other Topics Concern  . Not on file  Social History Narrative  . Not on file   Social Determinants of Health   Financial Resource Strain:   . Difficulty of Paying Living Expenses:   Food Insecurity:   . Worried About Charity fundraiser in the Last Year:   . Arboriculturist in the Last Year:   Transportation Needs:   . Film/video editor (Medical):   [name] Lack of Transportation (Non-Medical):   Physical Activity:   . Days of Exercise per Week:   . Minutes of Exercise per Session:   Stress:   . Feeling of Stress :   Social Connections:   . Frequency of Communication with Friends and Family:   . Frequency of Social Gatherings with Friends and Family:   . Attends Religious Services:   . Active Member of Clubs or Organizations:   . Attends Archivist Meetings:   [name] Marital Status:   Intimate Partner Violence:   . Fear of Current or Ex-Partner:   . Emotionally Abused:   [name] Physically Abused:   . Sexually Abused:       PHYSICAL EXAM  Vitals:   05/13/20 1332  BP: 128/76  Pulse: 87  Weight: 244 lb (110.7 kg)  Height: 5' 4.5" (1.638 m)   Body mass index is 41.24 kg/m.  Generalized: Well developed, in no acute distress  Cardiology: normal rate and rhythm, no murmur noted Neurological examination  Mentation: Alert oriented to time, place, history taking.  Follows all commands speech and language fluent, no dysarthria, no aphasia.  Montreal Cognitive Assessment  05/13/2020  Visuospatial/ Executive (0/5) 5  Naming (0/3) 3  Attention: Read list of digits (0/2) 2  Attention: Read list of letters (0/1) 1  Attention: Serial 7 subtraction starting at  100 (0/3) 3  Language: Repeat phrase (0/2) 2  Language : Fluency (0/1) 1  Abstraction (0/2) 2  Delayed Recall (0/5) 3  Orientation (0/6) 6  Total 28   neck size 18 inches. ,a Mallampati: 3 plus.  Cranial nerve :  No loss of taste or smell, dry eyes, beginning cataract.  Pupils were equal in size  and round reactive to light.  Extraocular movements were full. Facial sensation and strength were normal.  Uvula and tongue midline.  Head turning and shoulder shrug were full- symmetric. Motor:  symmetric motor tone is noted throughout.  Coordination: deferred.  Gait and station: Gait is wide based.   DIAGNOSTIC DATA (LABS, IMAGING, TESTING) - I reviewed patient records, labs, notes, testing and imaging myself where available.  Suzanne Hansen has used the machine for the highest compliance rate between mid June and mid July but her current compliance rate is still only 52% on average she has used the machine 2 hours 59 minutes/day on average of days used 5 hours and 43 minutes.  There is no doubt that the machine works her AHI is 1.7/h she has mostly obstructive residual apneas her 95th percentile pressure is 8.9 cm so I could actually reduce her maximum pressure if she wanted to and she has very few air leaks she needs a new supply of nasal pillows which she will buy online through [email or web address] or similar supplier, her Epworth sleepiness score is not high neither his fatigue and her depression score was not clinically significant.  We also performed a Montreal cognitive assessment today and she scored 28 out of 30 points an excellent result.    Lab Results  Component Value Date   WBC 8.3 10/02/2019   HGB 11.6 (L) 10/02/2019   HCT 36.1 10/02/2019   MCV 91.1 10/02/2019   PLT 404.0 (H) 10/02/2019      Component Value Date/Time   NA 139 10/02/2019 1532   NA 141 07/18/2019 0900   K 4.2 10/02/2019 1532   CL 99 10/02/2019 1532   CO2 30 10/02/2019 1532   GLUCOSE 76 10/02/2019 1532   BUN 18  10/02/2019 1532   BUN 26 07/18/2019 0900   CREATININE 1.18 10/02/2019 1532   CREATININE 0.97 09/17/2015 1113   CALCIUM 9.9 10/02/2019 1532   PROT 6.5 10/02/2019 1532   PROT 6.6 07/18/2019 0900   ALBUMIN 4.2 10/02/2019 1532   ALBUMIN 4.4 07/18/2019 0900   AST 17 10/02/2019 1532   ALT 18 10/02/2019 1532   ALKPHOS 164 (H) 10/02/2019 1532   BILITOT 0.5 10/02/2019 1532   BILITOT 0.4 07/18/2019 0900   GFRNONAA 38 (L) 09/03/2019 1335   GFRAA 44 (L) 09/03/2019 1335   Lab Results  Component Value Date   CHOL 132 10/02/2019   HDL 69.20 10/02/2019   LDLCALC 48 10/02/2019   TRIG 75.0 10/02/2019   CHOLHDL 2 10/02/2019   Lab Results  Component Value Date   HGBA1C 5.8 10/02/2019   Lab Results  Component Value Date   SVXBLTJQ30 092 10/02/2019   Lab Results  Component Value Date   TSH 0.67 10/02/2019    ASSESSMENT AND PLAN  No diagnosis found. : Suzanne Hansen is doing very well with CPAP therapy.  She has noted significant improvement in her energy and less daytime sleepiness.  Compliance report reveals excellent compliance.  She was encouraged to continue using CPAP nightly and for greater than 4 hours each night.  She will follow-up annually, sooner if needed.  She verbalizes understanding and agreement with this plan. She will purchase CPAP supplies  by [email or web address].   I spent 15 minutes with the patient. 50% of this time was spent counseling and educating patient on plan of care and medications.  She needs to continue CPAP, it has helped her breathing, her renal function . Her heart function-  We discussed compliance related behavior strategy.   The patient is fully vaccinated.   Suzanne Presto, FNP-C 05/13/2020, 1:56 PM [facility] [address] [place], [place] [address] [phone]

## 2020-05-13 NOTE — Patient Instructions (Signed)
CPAP and BPAP Information CPAP and BPAP are methods of helping a person breathe with the use of air pressure. CPAP stands for "continuous positive airway pressure." BPAP stands for "bi-level positive airway pressure." In both methods, air is blown through your nose or mouth and into your air passages to help you breathe well. CPAP and BPAP use different amounts of pressure to blow air. With CPAP, the amount of pressure stays the same while you breathe in and out. With BPAP, the amount of pressure is increased when you breathe in (inhale) so that you can take larger breaths. Your health care provider will recommend whether CPAP or BPAP would be more helpful for you. Why are CPAP and BPAP treatments used? CPAP or BPAP can be helpful if you have:  Sleep apnea.  Chronic obstructive pulmonary disease (COPD).  Heart failure.  Medical conditions that weaken the muscles of the chest including muscular dystrophy, or neurological diseases such as amyotrophic lateral sclerosis (ALS).  Other problems that cause breathing to be weak, abnormal, or difficult. CPAP is most commonly used for obstructive sleep apnea (OSA) to keep the airways from collapsing when the muscles relax during sleep. How is CPAP or BPAP administered? Both CPAP and BPAP are provided by a small machine with a flexible plastic tube that attaches to a plastic mask. You wear the mask. Air is blown through the mask into your nose or mouth. The amount of pressure that is used to blow the air can be adjusted on the machine. Your health care provider will determine the pressure setting that should be used based on your individual needs. When should CPAP or BPAP be used? In most cases, the mask only needs to be worn during sleep. Generally, the mask needs to be worn throughout the night and during any daytime naps. People with certain medical conditions may also need to wear the mask at other times when they are awake. Follow instructions from your  health care provider about when to use the machine. What are some tips for using the mask?   Because the mask needs to be snug, some people feel trapped or closed-in (claustrophobic) when first using the mask. If you feel this way, you may need to get used to the mask. One way to do this is by holding the mask loosely over your nose or mouth and then gradually applying the mask more snugly. You can also gradually increase the amount of time that you use the mask.  Masks are available in various types and sizes. Some fit over your mouth and nose while others fit over just your nose. If your mask does not fit well, talk with your health care provider about getting a different one.  If you are using a mask that fits over your nose and you tend to breathe through your mouth, a chin strap may be applied to help keep your mouth closed.  The CPAP and BPAP machines have alarms that may sound if the mask comes off or develops a leak.  If you have trouble with the mask, it is very important that you talk with your health care provider about finding a way to make the mask easier to tolerate. Do not stop using the mask. Stopping the use of the mask could have a negative impact on your health. What are some tips for using the machine?  Place your CPAP or BPAP machine on a secure table or stand near an electrical outlet.  Know where the on/off   switch is located on the machine.  Follow instructions from your health care provider about how to set the pressure on your machine and when you should use it.  Do not eat or drink while the CPAP or BPAP machine is on. Food or fluids could get pushed into your lungs by the pressure of the CPAP or BPAP.  Do not smoke. Tobacco smoke residue can damage the machine.  For home use, CPAP and BPAP machines can be rented or purchased through home health care companies. Many different brands of machines are available. Renting a machine before purchasing may help you find out  which particular machine works well for you.  Keep the CPAP or BPAP machine and attachments clean. Ask your health care provider for specific instructions. Get help right away if:  You have redness or open areas around your nose or mouth where the mask fits.  You have trouble using the CPAP or BPAP machine.  You cannot tolerate wearing the CPAP or BPAP mask.  You have pain, discomfort, and bloating in your abdomen. Summary  CPAP and BPAP are methods of helping a person breathe with the use of air pressure.  Both CPAP and BPAP are provided by a small machine with a flexible plastic tube that attaches to a plastic mask.  If you have trouble with the mask, it is very important that you talk with your health care provider about finding a way to make the mask easier to tolerate. This information is not intended to replace advice given to you by your health care provider. Make sure you discuss any questions you have with your health care provider. Document Revised: 01/16/2019 Document Reviewed: 08/15/2016 Elsevier Patient Education  [place]. Insomnia Insomnia is a sleep disorder that makes it difficult to fall asleep or stay asleep. Insomnia can cause fatigue, low energy, difficulty concentrating, mood swings, and poor performance at work or school. There are three different ways to classify insomnia:  Difficulty falling asleep.  Difficulty staying asleep.  Waking up too early in the morning. Any type of insomnia can be long-term (chronic) or short-term (acute). Both are common. Short-term insomnia usually lasts for three months or less. Chronic insomnia occurs at least three times a week for longer than three months. What are the causes? Insomnia may be caused by another condition, situation, or substance, such as:  Anxiety.  Certain medicines.  Gastroesophageal reflux disease (GERD) or other gastrointestinal conditions.  Asthma or other breathing  conditions.  Restless legs syndrome, sleep apnea, or other sleep disorders.  Chronic pain.  Menopause.  Stroke.  Abuse of alcohol, tobacco, or illegal drugs.  Mental health conditions, such as depression.  Caffeine.  Neurological disorders, such as Alzheimer's disease.  An overactive thyroid (hyperthyroidism). Sometimes, the cause of insomnia may not be known. What increases the risk? Risk factors for insomnia include:  Gender. Women are affected more often than men.  Age. Insomnia is more common as you get older.  Stress.  Lack of exercise.  Irregular work schedule or working night shifts.  Traveling between different time zones.  Certain medical and mental health conditions. What are the signs or symptoms? If you have insomnia, the main symptom is having trouble falling asleep or having trouble staying asleep. This may lead to other symptoms, such as:  Feeling fatigued or having low energy.  Feeling nervous about going to sleep.  Not feeling rested in the morning.  Having trouble concentrating.  Feeling irritable, anxious, or depressed. How  is this diagnosed? This condition may be diagnosed based on:  Your symptoms and medical history. Your health care provider may ask about: ? Your sleep habits. ? Any medical conditions you have. ? Your mental health.  A physical exam. How is this treated? Treatment for insomnia depends on the cause. Treatment may focus on treating an underlying condition that is causing insomnia. Treatment may also include:  Medicines to help you sleep.  Counseling or therapy.  Lifestyle adjustments to help you sleep better. Follow these instructions at home: Eating and drinking   Limit or avoid alcohol, caffeinated beverages, and cigarettes, especially close to bedtime. These can disrupt your sleep.  Do not eat a large meal or eat spicy foods right before bedtime. This can lead to digestive discomfort that can make it hard  for you to sleep. Sleep habits   Keep a sleep diary to help you and your health care provider figure out what could be causing your insomnia. Write down: ? When you sleep. ? When you wake up during the night. ? How well you sleep. ? How rested you feel the next day. ? Any side effects of medicines you are taking. ? What you eat and drink.  Make your bedroom a dark, comfortable place where it is easy to fall asleep. ? Put up shades or blackout curtains to block light from outside. ? Use a white noise machine to block noise. ? Keep the temperature cool.  Limit screen use before bedtime. This includes: ? Watching TV. ? Using your smartphone, tablet, or computer.  Stick to a routine that includes going to bed and waking up at the same times every day and night. This can help you fall asleep faster. Consider making a quiet activity, such as reading, part of your nighttime routine.  Try to avoid taking naps during the day so that you sleep better at night.  Get out of bed if you are still awake after 15 minutes of trying to sleep. Keep the lights down, but try reading or doing a quiet activity. When you feel sleepy, go back to bed. General instructions  Take over-the-counter and prescription medicines only as told by your health care provider.  Exercise regularly, as told by your health care provider. Avoid exercise starting several hours before bedtime.  Use relaxation techniques to manage stress. Ask your health care provider to suggest some techniques that may work well for you. These may include: ? Breathing exercises. ? Routines to release muscle tension. ? Visualizing peaceful scenes.  Make sure that you drive carefully. Avoid driving if you feel very sleepy.  Keep all follow-up visits as told by your health care provider. This is important. Contact a health care provider if:  You are tired throughout the day.  You have trouble in your daily routine due to  sleepiness.  You continue to have sleep problems, or your sleep problems get worse. Get help right away if:  You have serious thoughts about hurting yourself or someone else. If you ever feel like you may hurt yourself or others, or have thoughts about taking your own life, get help right away. You can go to your nearest emergency department or call:  Your local emergency services (911 in the [place]  A suicide crisis helpline, such as the [facility] at 857-607-0685. This is open 24 hours a day. Summary  Insomnia is a sleep disorder that makes it difficult to fall asleep or stay asleep.  Insomnia can be  long-term (chronic) or short-term (acute).  Treatment for insomnia depends on the cause. Treatment may focus on treating an underlying condition that is causing insomnia.  Keep a sleep diary to help you and your health care provider figure out what could be causing your insomnia. This information is not intended to replace advice given to you by your health care provider. Make sure you discuss any questions you have with your health care provider. Document Revised: 09/08/2017 Document Reviewed: 07/06/2017 Elsevier Patient Education  2020 [employer].

## 2020-05-18 ENCOUNTER — Other Ambulatory Visit: Payer: Self-pay | Admitting: Internal Medicine

## 2020-05-18 NOTE — Telephone Encounter (Signed)
Please refill as per office routine med refill policy (all routine meds refilled for 3 mo or monthly per pt preference up to one year from last visit, then month to month grace period for 3 mo, then further med refills will have to be denied)  

## 2020-05-19 DIAGNOSIS — I5032 Chronic diastolic (congestive) heart failure: Secondary | ICD-10-CM | POA: Diagnosis not present

## 2020-05-19 DIAGNOSIS — N1831 Chronic kidney disease, stage 3a: Secondary | ICD-10-CM | POA: Diagnosis not present

## 2020-05-19 DIAGNOSIS — E559 Vitamin D deficiency, unspecified: Secondary | ICD-10-CM | POA: Diagnosis not present

## 2020-06-01 ENCOUNTER — Ambulatory Visit: Payer: BC Managed Care – PPO | Admitting: Family Medicine

## 2020-06-08 NOTE — Progress Notes (Signed)
[place] [address] [place] Phone: [phone] Subjective:   I Suzanne Hansen am serving as a Education administrator for Dr. Hulan Saas.  This visit occurred during the SARS-CoV-2 public health emergency.  Safety protocols were in place, including screening questions prior to the visit, additional usage of staff PPE, and extensive cleaning of exam room while observing appropriate contact time as indicated for disinfecting solutions.   I'm seeing this patient by the request  of:  Biagio Borg, MD  CC: Bilateral knee pain  [id number]      Onset-  Chronic problem with exacerbation.  Patient does give pain medication from another provider.  Responds well to the steroid injection still at this time.  Patient wants to avoid surgical intervention.  Patient will continue to work on weight loss.  Discussed icing regimen and home exercises.  Brought up the idea of aquatic therapy which patient declined at the moment.  Follow-up again in 4 to 8 weeks  Following up with neurosurgeon. vilazodone rx  Update 06/09/2020 Suzanne Hansen is a 64 y.o. female coming in with complaint of bilateral knee pain and lumbar spinal stenosis. Patient states she is still having some issues. Medications prescribed made her feel disoriented so she discontinued. Using the Pennsaid and gabapentin, 650 mg tylenol, tramadol when the pain is bad and muscle relaxer. Patient is known to have severe patellofemoral arthritis.    Past Medical History:  Diagnosis Date  . ALLERGIC RHINITIS 05/09/2008  . Allergy   . ANXIETY 05/23/2007  . ARM PAIN, LEFT 10/23/2009  . Arthritis   . Asthma   . ASTHMA 05/23/2007  . Back pain   . Cardiomyopathy ([place]) 09/08/2015  . Chest pain   . CHF (congestive heart failure) ([place])   . Chronic diastolic heart failure ([place]) 01/22/2020  . Constipation   . CONTACT DERMATITIS 06/09/2009  . Diabetes mellitus without complication ([place])    type II  .  Dyspnea    allergies-dust, cig smoke, rag weeds  . ENDOMETRIOSIS NOS 05/23/2007  . FACIAL PAIN 06/02/2010  . Food allergy   . FREQUENCY, URINARY 05/17/2010  . GERD (gastroesophageal reflux disease)   . GLUCOSE INTOLERANCE 08/28/2009  . HERPES SIMPLEX, UNCOMPLICATED 9/67/8938  . Hyperlipidemia 08/28/2015  . HYPERTHYROIDISM 05/23/2007  . Hypoactive thyroid   . HYPOTHYROIDISM 05/09/2008  . Impaired glucose tolerance 01/28/2011  . INSOMNIA, HX OF 05/23/2007  . Joint pain   . LATERAL EPICONDYLITIS, RIGHT 03/10/2009  . LIPOMA 10/22/2010  . NECK PAIN 11/10/2010  . OBESITY 05/23/2007  . Osteopenia 09/28/2018  . Plantar fasciitis    Both feet  . Pre-diabetes   . SHOULDER PAIN, LEFT 05/17/2010  . SINUSITIS- ACUTE-NOS 11/03/2008  . SKIN LESION 11/10/2010  . Sleep apnea   . Stage III chronic kidney disease    Stage III  . Unspecified Chest Pain 07/25/2008  . UNSPECIFIED URTICARIA 06/04/2009  . URI 08/28/2009  . UTI 04/29/2008  . VITAMIN D DEFICIENCY 08/28/2009  . Wheezing 07/12/2010   Past Surgical History:  Procedure Laterality Date  . COLONOSCOPY    . ENDOMETRIAL ABLATION    . LAMINECTOMY  1996  . LAPAROTOMY     exploratory  . lower back surgery  07/06/2017   cyst  . POLYPECTOMY    . s/p endometrial ablation  12/06  . TUBAL LIGATION    . UTERINE FIBROID SURGERY    . WISDOM TOOTH EXTRACTION     Social History   Socioeconomic  History  . Marital status: Divorced    Spouse name: Not on file  . Number of children: 3  . Years of education: Not on file  . Highest education level: Not on file  Occupational History  . Occupation: SOCIAL WORKER    Employer: [employer]    Comment: [employer]  Tobacco Use  . Smoking status: Former Smoker    Packs/day: 0.50    Years: 12.00    Pack years: 6.00    Types: Cigarettes    Quit date: 11/05/1987    Years since quitting: 32.6  . Smokeless tobacco: Never Used  Vaping Use  . Vaping Use: Never used  Substance and Sexual Activity  .  Alcohol use: No    Alcohol/week: 1.0 standard drink    Types: 1 Glasses of wine per week    Comment: WINE  . Drug use: No  . Sexual activity: Not on file  Other Topics Concern  . Not on file  Social History Narrative  . Not on file   Social Determinants of Health   Financial Resource Strain:   . Difficulty of Paying Living Expenses: Not on file  Food Insecurity:   . Worried About Charity fundraiser in the Last Year: Not on file  . Ran Out of Food in the Last Year: Not on file  Transportation Needs:   . Lack of Transportation (Medical): Not on file  . Lack of Transportation (Non-Medical): Not on file  Physical Activity:   . Days of Exercise per Week: Not on file  . Minutes of Exercise per Session: Not on file  Stress:   . Feeling of Stress : Not on file  Social Connections:   . Frequency of Communication with Friends and Family: Not on file  . Frequency of Social Gatherings with Friends and Family: Not on file  . Attends Religious Services: Not on file  . Active Member of Clubs or Organizations: Not on file  . Attends Archivist Meetings: Not on file  . Marital Status: Not on file   Allergies  Allergen Reactions  . Shellfish Allergy Anaphylaxis  . Clarithromycin Nausea And Vomiting  . Latex Itching and Other (See Comments)    ITCHING AND COLD SORES AROUND MOUTH WHEN LATEX GLOVES USED BY THE DENTIST/HYGENTIST  . Metronidazole Nausea And Vomiting  . Zostavax [Zoster Vaccine Live]     Arm swelled  . Adhesive [Tape] Dermatitis    Plastic tape   . Lisinopril Cough  . Other Dermatitis    Plastic tape   . Peanut Oil     Pt says it's ok   Family History  Problem Relation Age of Onset  . Diabetes Cousin   . Hypertension Cousin   . Heart disease Cousin   . Heart disease Mother        Rheumatic heart disease  . Depression Mother   . COPD Father   . Hypertension Father   . Cancer Maternal Grandmother        Breast  . Diabetes Sister   . Kidney disease  Sister     Current Outpatient Medications (Endocrine & Metabolic):  .  levothyroxine (SYNTHROID) 112 MCG tablet, TAKE 1 TABLET BY MOUTH DAILY BEFORE BREAKFAST. ANNUAL APPOINTMENT IS DUE MUST SEE PROVIDER FOR FUTURE REFILLS  Current Outpatient Medications (Cardiovascular):  .  atorvastatin (LIPITOR) 20 MG tablet, Take 1 tablet (20 mg total) by mouth daily. Annual appt w/labs are due must see provider for future refills .  losartan (COZAAR) 25 MG tablet, Take 0.5 tablets (12.5 mg total) by mouth daily. [name]  torsemide (DEMADEX) 20 MG tablet, TAKE 2 TABLETS IN THE MORNING AND 1 IN THE EVENING. OK TO TAKE AN EXTRA DAILY AS NEEDED FOR WEIGHT GAIN  Current Outpatient Medications (Respiratory):  .  budesonide-formoterol (SYMBICORT) 160-4.5 MCG/ACT inhaler, Inhale 2 puffs into the lungs 2 (two) times daily. .  fexofenadine (ALLEGRA) 180 MG tablet, Take 180 mg by mouth daily.   .  fluticasone (FLONASE) 50 MCG/ACT nasal spray, Place 2 sprays into both nostrils daily. (Patient taking differently: Place 2 sprays into both nostrils daily as needed for allergies. ) .  guaiFENesin (MUCINEX) 600 MG 12 hr tablet, Take 2 tablets (1,200 mg total) by mouth 2 (two) times daily as needed for to loosen phlegm.  Current Outpatient Medications (Analgesics):  .  acetaminophen (TYLENOL) 650 MG CR tablet, Take by mouth. [name]  allopurinol (ZYLOPRIM) 100 MG tablet, Take 2 tablets (200 mg total) by mouth daily. Take 200 mg by mouth daily. [name]  aspirin 81 MG EC tablet, Take 81 mg by mouth daily.   .  traMADol (ULTRAM) 50 MG tablet, Take 1 tablet (50 mg total) by mouth every 6 (six) hours as needed. (Patient taking differently: Take 50 mg by mouth every 6 (six) hours as needed (pain). )   Current Outpatient Medications (Other):  [name]  buPROPion (WELLBUTRIN XL) 150 MG 24 hr tablet, Take 150 mg by mouth daily. .  Cholecalciferol 25 MCG (1000 UT) tablet, Take by mouth. .  ergocalciferol (VITAMIN D2) 1.25 MG (50000 UT) capsule,  ergocalciferol (vitamin D2) 1,250 mcg (50,000 unit) capsule .  fluconazole (DIFLUCAN) 150 MG tablet, 1 tab by mouth every 3 days as needed .  gabapentin (NEURONTIN) 100 MG capsule, Take 100 mg by mouth 2 (two) times daily. [name]  gabapentin (NEURONTIN) 300 MG capsule, Take 300 mg by mouth 3 (three) times daily. [name]  KLOR-CON M20 20 MEQ tablet, Take 1 tablet (20 mEq total) by mouth daily. OK TO TAKE AN EXTRA ON DAYS YOU TAKE EXTRA TORSEMIDE .  Menthol-Methyl Salicylate (MUSCLE RUB) 10-15 % CREA, Apply 1 application topically as needed for muscle pain. .  Multiple Vitamin (MULTIVITAMIN) capsule, Take 1 capsule by mouth daily.   [name]  omeprazole (PRILOSEC) 20 MG capsule, Take 20 mg by mouth daily as needed (acid reflux).  [name]  PENNSAID 2 % SOLN, APPLY 2 PUMPS (2 GRAMS) TO AFFECTED AREA TOPICALLY TWICE DAILY AS DIRECTED .  valACYclovir (VALTREX) 1000 MG tablet, Take 500 mg by mouth daily as needed (cold sores).  .  Vilazodone HCl (VIIBRYD) 10 MG TABS, Take 1 tablet (10 mg total) by mouth daily. (Patient not taking: Reported on 06/09/2020)   Reviewed prior external information including notes and imaging from  primary care provider As well as notes that were available from care everywhere and other healthcare systems.  Past medical history, social, surgical and family history all reviewed in electronic medical record.  No pertanent information unless stated regarding to the chief complaint.   Review of Systems:  No headache, visual changes, nausea, vomiting, diarrhea, constipation, dizziness, abdominal pain, skin rash, fevers, chills, night sweats, weight loss, swollen lymph nodes, body aches, joint swelling, chest pain, shortness of breath, mood changes. POSITIVE muscle aches  Objective  Blood pressure 100/70, pulse 94, height 5' 4.5" (1.638 m), weight 245 lb (111.1 kg), SpO2 97 %.   General: No apparent distress alert and oriented x3 mood and affect normal, dressed  appropriately.  HEENT: Pupils equal,  extraocular movements intact  Respiratory: Patient's speak in full sentences and does not appear short of breath  Cardiovascular: Trace lower extremity edema, non tender, no erythema  Gait antalgic Knee: bilateral  valgus deformity noted. Large thigh to calf ratio.  Tender to palpation over patellofemoral laterally ROM decreased lacking last 2 degrees of extension. instability with valgus force.  painful patellar compression. Patellar glide with severe crepitus. Patellar and quadriceps tendons unremarkable. Hamstring and quadriceps strength is normal.  After informed written and verbal consent, patient was seated on exam table. Right knee was prepped with alcohol swab and utilizing anterolateral approach, patient's right knee space was injected with 4:1  marcaine 0.5%: Kenalog 40mg /dL. Patient tolerated the procedure well without immediate complications.  After informed written and verbal consent, patient was seated on exam table. Left knee was prepped with alcohol swab and utilizing anterolateral approach, patient's left knee space was injected with 4:1  marcaine 0.5%: Kenalog 40mg /dL. Patient tolerated the procedure well without immediate complications.   Impression and Recommendations:     The above documentation has been reviewed and is accurate and complete Lyndal Pulley, DO       Note: This dictation was prepared with Dragon dictation along with smaller phrase technology. Any transcriptional errors that result from this process are unintentional.

## 2020-06-09 ENCOUNTER — Ambulatory Visit (INDEPENDENT_AMBULATORY_CARE_PROVIDER_SITE_OTHER): Payer: BC Managed Care – PPO

## 2020-06-09 ENCOUNTER — Ambulatory Visit: Payer: BC Managed Care – PPO | Admitting: Family Medicine

## 2020-06-09 ENCOUNTER — Other Ambulatory Visit: Payer: Self-pay

## 2020-06-09 ENCOUNTER — Encounter: Payer: Self-pay | Admitting: Family Medicine

## 2020-06-09 VITALS — BP 100/70 | HR 94 | Ht 64.5 in | Wt 245.0 lb

## 2020-06-09 DIAGNOSIS — M25561 Pain in right knee: Secondary | ICD-10-CM

## 2020-06-09 DIAGNOSIS — G8929 Other chronic pain: Secondary | ICD-10-CM

## 2020-06-09 DIAGNOSIS — M25562 Pain in left knee: Secondary | ICD-10-CM

## 2020-06-09 DIAGNOSIS — M170 Bilateral primary osteoarthritis of knee: Secondary | ICD-10-CM | POA: Diagnosis not present

## 2020-06-09 NOTE — Patient Instructions (Addendum)
Xray of the knees today  See me again in 10-12 weeks

## 2020-06-09 NOTE — Assessment & Plan Note (Signed)
Bilateral injections discussed exercises.  Patient has failed the viscosupplementation.  Discussed potential PRP injections and patient will consider that.  Patient wants to avoid surgical intervention as long as possible.  Patient has had an MRI of the left knee showing fairly severe patellofemoral arthritis that is likely contributing.

## 2020-06-16 ENCOUNTER — Telehealth: Payer: Self-pay | Admitting: Internal Medicine

## 2020-06-16 ENCOUNTER — Other Ambulatory Visit: Payer: Self-pay

## 2020-06-16 NOTE — Telephone Encounter (Signed)
    Pt c/o medication issue:  1. Name of Medication: atorvastatin (LIPITOR) 20 MG tablet  2. How are you currently taking this medication (dosage and times per day)? No longer taking  3. Are you having a reaction (difficulty breathing--STAT)? no  4. What is your medication issue? Patient states medication caused  leg, feet cramping.

## 2020-06-16 NOTE — Telephone Encounter (Signed)
Sent to dr. Jenny Reichmann to advise.

## 2020-06-17 NOTE — Telephone Encounter (Signed)
Ok to ask pt to take every other day, as this often helps stop the cramping and she would still be getting some of the medication

## 2020-06-18 DIAGNOSIS — M47816 Spondylosis without myelopathy or radiculopathy, lumbar region: Secondary | ICD-10-CM | POA: Diagnosis not present

## 2020-06-18 NOTE — Telephone Encounter (Signed)
LDVM for pt and of dr. Gwynn Burly suggestion.

## 2020-06-18 NOTE — Telephone Encounter (Signed)
   Patient states she did indeed try taking medication every other day, that did not help cramping. Patient states she even tried taking a different times of the day, drinking Gatorade and using mustard for cramps. Patient states she will no longer take Lipitor

## 2020-06-18 NOTE — Telephone Encounter (Signed)
Sent to Dr. John. 

## 2020-06-21 ENCOUNTER — Other Ambulatory Visit: Payer: Self-pay | Admitting: Internal Medicine

## 2020-06-29 ENCOUNTER — Encounter: Payer: Self-pay | Admitting: Family

## 2020-06-29 ENCOUNTER — Ambulatory Visit: Payer: BC Managed Care – PPO | Admitting: Family

## 2020-06-29 ENCOUNTER — Other Ambulatory Visit: Payer: Self-pay

## 2020-06-29 VITALS — BP 110/70 | HR 66 | Temp 98.1°F | Ht 64.5 in | Wt 247.0 lb

## 2020-06-29 DIAGNOSIS — E039 Hypothyroidism, unspecified: Secondary | ICD-10-CM

## 2020-06-29 DIAGNOSIS — Z23 Encounter for immunization: Secondary | ICD-10-CM | POA: Diagnosis not present

## 2020-06-29 DIAGNOSIS — T148XXA Other injury of unspecified body region, initial encounter: Secondary | ICD-10-CM | POA: Diagnosis not present

## 2020-06-29 DIAGNOSIS — E785 Hyperlipidemia, unspecified: Secondary | ICD-10-CM

## 2020-06-29 LAB — CBC WITH DIFFERENTIAL/PLATELET
Absolute Monocytes: 536 cells/uL (ref 200–950)
Basophils Absolute: 69 cells/uL (ref 0–200)
Basophils Relative: 1.1 %
Eosinophils Absolute: 88 cells/uL (ref 15–500)
Eosinophils Relative: 1.4 %
HCT: 40 % (ref 35.0–45.0)
Hemoglobin: 13.1 g/dL (ref 11.7–15.5)
Lymphs Abs: 2035 cells/uL (ref 850–3900)
MCH: 29.5 pg (ref 27.0–33.0)
MCHC: 32.8 g/dL (ref 32.0–36.0)
MCV: 90.1 fL (ref 80.0–100.0)
MPV: 10.8 fL (ref 7.5–12.5)
Monocytes Relative: 8.5 %
Neutro Abs: 3572 cells/uL (ref 1500–7800)
Neutrophils Relative %: 56.7 %
Platelets: 271 10*3/uL (ref 140–400)
RBC: 4.44 10*6/uL (ref 3.80–5.10)
RDW: 13.1 % (ref 11.0–15.0)
Total Lymphocyte: 32.3 %
WBC: 6.3 10*3/uL (ref 3.8–10.8)

## 2020-06-29 MED ORDER — PRAVASTATIN SODIUM 40 MG PO TABS
40.0000 mg | ORAL_TABLET | Freq: Every day | ORAL | 0 refills | Status: DC
Start: 2020-06-29 — End: 2020-10-15

## 2020-06-29 NOTE — Progress Notes (Signed)
Suzanne Hansen is a 64 y.o. female with the following history as recorded in EpicCare:  Patient Active Problem List   Diagnosis Date Noted  . Memory difficulties 05/13/2020  . Essential hypertension 02/26/2020  . Chronic diastolic heart failure ([place]) 01/22/2020  . Yeast vaginitis 10/12/2019  . Overactive bladder 10/02/2019  . Spinal stenosis at L4-L5 level 09/11/2019  . Effusion of left knee 05/31/2019  . Pain and swelling of left lower leg 05/31/2019  . Left otitis media 12/17/2018  . At risk for diabetes mellitus 11/08/2018  . Arthritis 10/29/2018  . Cervical radiculopathy at C6 10/23/2018  . Osteopenia 09/28/2018  . Elevated blood uric acid level 09/28/2018  . Polyarthralgia 09/28/2018  . Paroxysmal atrial fibrillation ([place]) 09/18/2018  . Prediabetes 08/13/2018  . OSA on CPAP 08/13/2018  . CKD (chronic kidney disease) stage 3, GFR 30-59 ml/min 06/26/2018  . Rash 06/26/2018  . Wheezing 06/26/2018  . Left hip pain 05/27/2018  . Bicipital tendinitis of right shoulder 05/16/2018  . Bursitis of left hip 05/16/2018  . GERD (gastroesophageal reflux disease) 09/27/2017  . Acute shoulder bursitis, left 08/28/2017  . Lumbosacral radiculopathy at L4 06/14/2017  . 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 Easy bruising 03/29/2017  . Left leg paresthesias 03/29/2017  . Muscle cramping 03/20/2017  . 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 Pain in lower jaw 11/10/2016  . Cough 11/06/2016  . Vaginitis 09/17/2016  . Left wrist pain 09/16/2016  . Hypercoagulable state ([place]) 06/09/2016  . Iron deficiency 06/09/2016  . Lumbar disc disease 06/09/2016  . Hypersomnia with sleep apnea 02/15/2016  . Extrinsic asthma 02/15/2016  . Ankle edema 02/15/2016  . Degenerative arthritis of knee, bilateral 01/21/2016  . Asthma with exacerbation 10/20/2015  . Acute sinus infection 09/29/2015  . Bilateral calf pain 08/31/2015  . Hyperlipidemia 08/28/2015   . Left knee pain 08/28/2015  . Varicose veins with pain 08/28/2015  . Peripheral edema 08/13/2015  . Injection site reaction 06/02/2015  . PHN (postherpetic neuralgia) 05/29/2015  . Shingles 06/10/2014  . Skin nodule 11/27/2012  . Encounter for well adult exam with abnormal findings 01/28/2011  . LIPOMA 10/22/2010  . Vitamin D deficiency 08/28/2009  . Hypothyroidism 05/09/2008  . Allergic rhinitis 05/09/2008  . HERPES ZOSTER W/NERVOUS COMPLICATION NEC 41/93/7902  . Hyperthyroidism 05/23/2007  . OBESITY 05/23/2007  . Anxiety state 05/23/2007  . Asthma 05/23/2007  . ENDOMETRIOSIS NOS 05/23/2007  . INSOMNIA, HX OF 05/23/2007    Current Outpatient Medications  Medication Sig Dispense Refill  . acetaminophen (TYLENOL) 650 MG CR tablet Take by mouth.    [name] allopurinol (ZYLOPRIM) 100 MG tablet Take 2 tablets (200 mg total) by mouth daily. Take 200 mg by mouth daily. 180 tablet 3  . aspirin 81 MG EC tablet Take 81 mg by mouth daily.      . budesonide-formoterol (SYMBICORT) 160-4.5 MCG/ACT inhaler Inhale 2 puffs into the lungs 2 (two) times daily. 10.2 g 6  . buPROPion (WELLBUTRIN XL) 150 MG 24 hr tablet Take 150 mg by mouth daily.    . Cholecalciferol 25 MCG (1000 UT) tablet Take by mouth.    . ergocalciferol (VITAMIN D2) 1.25 MG (50000 UT) capsule ergocalciferol (vitamin D2) 1,250 mcg (50,000 unit) capsule    . fexofenadine (ALLEGRA) 180 MG tablet Take 180 mg by mouth daily.      . fluconazole (DIFLUCAN) 150 MG tablet 1 tab by mouth every 3 days as needed 2 tablet 1  . fluticasone (  FLONASE) 50 MCG/ACT nasal spray Place 2 sprays into both nostrils daily. (Patient taking differently: Place 2 sprays into both nostrils daily as needed for allergies. ) 16 g 6  . gabapentin (NEURONTIN) 100 MG capsule Take 100 mg by mouth 2 (two) times daily.    [name] gabapentin (NEURONTIN) 300 MG capsule Take 300 mg by mouth 3 (three) times daily.    [name] KLOR-CON M20 20 MEQ tablet Take 1 tablet (20 mEq total) by  mouth daily. OK TO TAKE AN EXTRA ON DAYS YOU TAKE EXTRA TORSEMIDE 70 tablet 5  . levothyroxine (SYNTHROID) 112 MCG tablet TAKE 1 TABLET BY MOUTH DAILY BEFORE BREAKFAST 30 tablet 0  . losartan (COZAAR) 25 MG tablet Take 0.5 tablets (12.5 mg total) by mouth daily. 45 tablet 2  . Menthol-Methyl Salicylate (MUSCLE RUB) 10-15 % CREA Apply 1 application topically as needed for muscle pain.    . Multiple Vitamin (MULTIVITAMIN) capsule Take 1 capsule by mouth daily.      [name] omeprazole (PRILOSEC) 20 MG capsule Take 20 mg by mouth daily as needed (acid reflux).     [name] PENNSAID 2 % SOLN APPLY 2 PUMPS (2 GRAMS) TO AFFECTED AREA TOPICALLY TWICE DAILY AS DIRECTED 112 g 2  . torsemide (DEMADEX) 20 MG tablet TAKE 2 TABLETS IN THE MORNING AND 1 IN THE EVENING. OK TO TAKE AN EXTRA DAILY AS NEEDED FOR WEIGHT GAIN 60 tablet 5  . traMADol (ULTRAM) 50 MG tablet Take 1 tablet (50 mg total) by mouth every 6 (six) hours as needed. (Patient taking differently: Take 50 mg by mouth every 6 (six) hours as needed (pain). ) 30 tablet 0  . valACYclovir (VALTREX) 1000 MG tablet Take 500 mg by mouth daily as needed (cold sores).   0  . Vilazodone HCl (VIIBRYD) 10 MG TABS Take 1 tablet (10 mg total) by mouth daily. 30 tablet 0  . guaiFENesin (MUCINEX) 600 MG 12 hr tablet Take 2 tablets (1,200 mg total) by mouth 2 (two) times daily as needed for to loosen phlegm. 60 tablet 1  . pravastatin (PRAVACHOL) 40 MG tablet Take 1 tablet (40 mg total) by mouth daily. 90 tablet 0   No current facility-administered medications for this visit.    Allergies: Shellfish allergy, Clarithromycin, Latex, Metronidazole, Zostavax [zoster vaccine live], Adhesive [tape], Lisinopril, Other, and Peanut oil  Past Medical History:  Diagnosis Date  . ALLERGIC RHINITIS 05/09/2008  . Allergy   . ANXIETY 05/23/2007  . ARM PAIN, LEFT 10/23/2009  . Arthritis   . Asthma   . ASTHMA 05/23/2007  . Back pain   . Cardiomyopathy ([place]) 09/08/2015  . Chest pain   . CHF  (congestive heart failure) ([place])   . Chronic diastolic heart failure ([place]) 01/22/2020  . Constipation   . CONTACT DERMATITIS 06/09/2009  . Diabetes mellitus without complication ([place])    type II  . Dyspnea    allergies-dust, cig smoke, rag weeds  . ENDOMETRIOSIS NOS 05/23/2007  . FACIAL PAIN 06/02/2010  . Food allergy   . FREQUENCY, URINARY 05/17/2010  . GERD (gastroesophageal reflux disease)   . GLUCOSE INTOLERANCE 08/28/2009  . HERPES SIMPLEX, UNCOMPLICATED 1/66/0630  . Hyperlipidemia 08/28/2015  . HYPERTHYROIDISM 05/23/2007  . Hypoactive thyroid   . HYPOTHYROIDISM 05/09/2008  . Impaired glucose tolerance 01/28/2011  . INSOMNIA, HX OF 05/23/2007  . Joint pain   . LATERAL EPICONDYLITIS, RIGHT 03/10/2009  . LIPOMA 10/22/2010  . NECK PAIN 11/10/2010  . OBESITY 05/23/2007  . Osteopenia 09/28/2018  .  Plantar fasciitis    Both feet  . Pre-diabetes   . SHOULDER PAIN, LEFT 05/17/2010  . SINUSITIS- ACUTE-NOS 11/03/2008  . SKIN LESION 11/10/2010  . Sleep apnea   . Stage III chronic kidney disease    Stage III  . Unspecified Chest Pain 07/25/2008  . UNSPECIFIED URTICARIA 06/04/2009  . URI 08/28/2009  . UTI 04/29/2008  . VITAMIN D DEFICIENCY 08/28/2009  . Wheezing 07/12/2010    Past Surgical History:  Procedure Laterality Date  . COLONOSCOPY    . ENDOMETRIAL ABLATION    . LAMINECTOMY  1996  . LAPAROTOMY     exploratory  . lower back surgery  07/06/2017   cyst  . POLYPECTOMY    . s/p endometrial ablation  12/06  . TUBAL LIGATION    . UTERINE FIBROID SURGERY    . WISDOM TOOTH EXTRACTION      Family History  Problem Relation Age of Onset  . Diabetes Cousin   . Hypertension Cousin   . Heart disease Cousin   . Heart disease Mother        Rheumatic heart disease  . Depression Mother   . COPD Father   . Hypertension Father   . Cancer Maternal Grandmother        Breast  . Diabetes Sister   . Kidney disease Sister     Social History   Tobacco Use  . Smoking status: Former Smoker     Packs/day: 0.50    Years: 12.00    Pack years: 6.00    Types: Cigarettes    Quit date: 11/05/1987    Years since quitting: 32.6  . Smokeless tobacco: Never Used  Substance Use Topics  . Alcohol use: No    Alcohol/week: 1.0 standard drink    Types: 1 Glasses of wine per week    Comment: WINE    Subjective:  Patient was concerned about 2 circular discolored lesions on her right forearm; denies any new soaps, foods, detergents or medications.  Was originally concerned that she may have had ringworm but notes that area was not scaly/ did not have any itching; has started to heal on its own;   Also notes that she stopped her Lipitor due to concerns for muscle aches/ cramps; has felt much better; similar reaction on Crestor;   Wondering if time to check thyroid;   Objective:  Vitals:   06/29/20 1307  BP: 110/70  Pulse: 66  Temp: 98.1 F (36.7 C)  TempSrc: Oral  SpO2: 97%  Weight: 247 lb (112 kg)  Height: 5' 4.5" (1.638 m)    General: Well developed, well nourished, in no acute distress  Skin : Warm and dry. Areas of concern appear to be resolving bruising Head: Normocephalic and atraumatic  Lungs: Respirations unlabored; clear to auscultation bilaterally without wheeze, rales, rhonchi  Musculoskeletal: No deformities; no active joint inflammation  Extremities: No edema, cyanosis, clubbing  Vessels: Symmetric bilaterally  Neurologic: Alert and oriented; speech intact; face symmetrical; moves all extremities well; CNII-XII intact without focal deficit  Assessment:  1. Bruising   2. Hyperlipidemia, unspecified hyperlipidemia type   3. Hypothyroidism, unspecified type     Plan:  1. Reassurance; areas appear to be resolving; check CBC today; 2. Trial of Pravachol 40 mg daily- discuss with her PCP at CPE in December; ? Statin intolerance; 3. Due for labs again in December;    Return for CPE for Dr. Jenny Reichmann after 12/23.  Orders Placed This Encounter  Procedures  . CBC  with  Differential/Platelet    Standing Status:   Future    Standing Expiration Date:   06/29/2021    Requested Prescriptions   Signed Prescriptions Disp Refills  . pravastatin (PRAVACHOL) 40 MG tablet 90 tablet 0    Sig: Take 1 tablet (40 mg total) by mouth daily.

## 2020-06-29 NOTE — Addendum Note (Signed)
Addended by: Cresenciano Lick on: 06/29/2020 01:46 PM   Modules accepted: Orders

## 2020-06-29 NOTE — Addendum Note (Signed)
Addended by: Rolm Baptise on: 06/29/2020 02:07 PM   Modules accepted: Orders

## 2020-07-18 ENCOUNTER — Other Ambulatory Visit: Payer: Self-pay | Admitting: Internal Medicine

## 2020-07-18 NOTE — Telephone Encounter (Signed)
Sorry no refill

## 2020-07-18 NOTE — Telephone Encounter (Signed)
Please refill as per office routine med refill policy (all routine meds refilled for 3 mo or monthly per pt preference up to one year from last visit, then month to month grace period for 3 mo, then further med refills will have to be denied)  

## 2020-07-21 DIAGNOSIS — M5412 Radiculopathy, cervical region: Secondary | ICD-10-CM | POA: Diagnosis not present

## 2020-07-23 ENCOUNTER — Other Ambulatory Visit: Payer: Self-pay | Admitting: Internal Medicine

## 2020-07-30 ENCOUNTER — Other Ambulatory Visit: Payer: Self-pay | Admitting: Family Medicine

## 2020-07-30 ENCOUNTER — Other Ambulatory Visit: Payer: Self-pay

## 2020-07-30 ENCOUNTER — Ambulatory Visit: Payer: BC Managed Care – PPO | Admitting: Family Medicine

## 2020-07-30 ENCOUNTER — Ambulatory Visit: Payer: Self-pay

## 2020-07-30 VITALS — BP 110/70 | HR 94 | Ht 64.5 in | Wt 247.2 lb

## 2020-07-30 DIAGNOSIS — G8929 Other chronic pain: Secondary | ICD-10-CM

## 2020-07-30 DIAGNOSIS — R87619 Unspecified abnormal cytological findings in specimens from cervix uteri: Secondary | ICD-10-CM | POA: Insufficient documentation

## 2020-07-30 DIAGNOSIS — M25562 Pain in left knee: Secondary | ICD-10-CM | POA: Diagnosis not present

## 2020-07-30 NOTE — Patient Instructions (Addendum)
Thank you for coming in today.  Call or go to the ER if you develop a large red swollen joint with extreme pain or oozing puss.   If not better we can consider PRP or even MRI.   Please use voltaren gel up to 4x daily for pain as needed.

## 2020-07-30 NOTE — Progress Notes (Signed)
   I, Wendy Poet, LAT, ATC, am serving as scribe for Dr. Lynne Leader.  Suzanne Hansen is a 64 y.o. female who presents to [facility] [facility] today for f/u of chronic L knee pain.  She was last seen by Dr. Tamala Julian on 06/09/20 for B knee pain and lumbar spinal stenosis and had B knee injections.  She has been taking Gabapentin, Tylenol, Tramadol and using Pennsaid.  Since her last visit w/ Dr. Tamala Julian, pt reports that she went to the [place] on Friday night and states that she was walking down a decline at the end of the night and started having L post and ant knee pain.  She reports some swelling in her L knee.  Diagnostic testing: standing B knee XR- 06/09/20  Pertinent review of systems: No fevers or chills  Relevant historical information: Heart failure, hypertension, sleep apnea   Exam:  BP 110/70 (BP Location: Left Arm, Patient Position: Sitting, Cuff Size: Large)   Pulse 94   Ht 5' 4.5" (1.638 m)   Wt 247 lb 3.2 oz (112.1 kg)   SpO2 98%   BMI 41.78 kg/m  General: Well Developed, well nourished, and in no acute distress.   MSK: Left knee mild effusion Normal motion with crepitation. Tender palpation medial joint line. Stable ligamentous exam. Intact strength.    Lab and Radiology Results  Procedure: Real-time Ultrasound Guided Injection of left knee superior lateral patellar space Device: Philips Affiniti 50G Images permanently stored and available for review in PACS Verbal informed consent obtained.  Discussed risks and benefits of procedure. Warned about infection bleeding damage to structures skin hypopigmentation and fat atrophy among others. Patient expresses understanding and agreement Time-out conducted.   Noted no overlying erythema, induration, or other signs of local infection.   Skin prepped in a sterile fashion.   Local anesthesia: Topical Ethyl chloride.   With sterile technique and under real time ultrasound guidance:  40 mg of Kenalog  and 2 mL of Marcaine injected into joint. Fluid seen entering the joint capsule.   Completed without difficulty   Pain immediately resolved suggesting accurate placement of the medication.   Advised to call if fevers/chills, erythema, induration, drainage, or persistent bleeding.   Images permanently stored and available for review in the ultrasound unit.  Impression: Technically successful ultrasound guided injection.    Assessment and Plan: 64 y.o. female with left knee pain due to exacerbation of DJD.  Plan for injection as above today. Recommend also Voltaren gel.  Advance activity as tolerated.  If not improving could consider PRP or even MRI.  PDMP not reviewed this encounter. Orders Placed This Encounter  Procedures  . [place] LIMITED JOINT SPACE STRUCTURES LOW LEFT(NO LINKED CHARGES)    Order Specific Question:   Reason for Exam (SYMPTOM  OR DIAGNOSIS REQUIRED)    Answer:   L knee pain    Order Specific Question:   Preferred imaging location?    Answer:   [facility]   No orders of the defined types were placed in this encounter.    Discussed warning signs or symptoms. Please see discharge instructions. Patient expresses understanding.   The above documentation has been reviewed and is accurate and complete Lynne Leader, M.D.

## 2020-07-31 ENCOUNTER — Other Ambulatory Visit: Payer: Self-pay | Admitting: Internal Medicine

## 2020-07-31 ENCOUNTER — Telehealth: Payer: Self-pay | Admitting: Internal Medicine

## 2020-07-31 DIAGNOSIS — Z6841 Body Mass Index (BMI) 40.0 and over, adult: Secondary | ICD-10-CM | POA: Diagnosis not present

## 2020-07-31 DIAGNOSIS — Z79890 Hormone replacement therapy (postmenopausal): Secondary | ICD-10-CM | POA: Diagnosis not present

## 2020-07-31 DIAGNOSIS — Z01419 Encounter for gynecological examination (general) (routine) without abnormal findings: Secondary | ICD-10-CM | POA: Diagnosis not present

## 2020-07-31 DIAGNOSIS — Z13820 Encounter for screening for osteoporosis: Secondary | ICD-10-CM | POA: Diagnosis not present

## 2020-07-31 DIAGNOSIS — Z1231 Encounter for screening mammogram for malignant neoplasm of breast: Secondary | ICD-10-CM | POA: Diagnosis not present

## 2020-07-31 DIAGNOSIS — F418 Other specified anxiety disorders: Secondary | ICD-10-CM | POA: Diagnosis not present

## 2020-07-31 NOTE — Telephone Encounter (Signed)
Please refill as per office routine med refill policy (all routine meds refilled for 3 mo or monthly per pt preference up to one year from last visit, then month to month grace period for 3 mo, then further med refills will have to be denied)  

## 2020-08-05 NOTE — Telephone Encounter (Signed)
   Patient states pharmacy does not have new order for levothyroxine (SYNTHROID) 112 MCG tablet

## 2020-08-07 ENCOUNTER — Other Ambulatory Visit: Payer: Self-pay

## 2020-08-07 MED ORDER — LEVOTHYROXINE SODIUM 112 MCG PO TABS: ORAL_TABLET | ORAL | 0 refills | Status: DC

## 2020-08-17 NOTE — Progress Notes (Deleted)
[facility] [address] [place] Phone: [phone] Subjective:    I'm seeing this patient by the request  of:  Biagio Borg, MD  CC:   [id number]  Suzanne Hansen is a 64 y.o. female coming in with complaint of ***  Onset-  Location Duration-  Character- Aggravating factors- Reliving factors-  Therapies tried-  Severity-     Past Medical History:  Diagnosis Date  . ALLERGIC RHINITIS 05/09/2008  . Allergy   . ANXIETY 05/23/2007  . ARM PAIN, LEFT 10/23/2009  . Arthritis   . Asthma   . ASTHMA 05/23/2007  . Back pain   . Cardiomyopathy ([place]) 09/08/2015  . Chest pain   . CHF (congestive heart failure) ([place])   . Chronic diastolic heart failure ([place]) 01/22/2020  . Constipation   . CONTACT DERMATITIS 06/09/2009  . Diabetes mellitus without complication ([place])    type II  . Dyspnea    allergies-dust, cig smoke, rag weeds  . ENDOMETRIOSIS NOS 05/23/2007  . FACIAL PAIN 06/02/2010  . Food allergy   . FREQUENCY, URINARY 05/17/2010  . GERD (gastroesophageal reflux disease)   . GLUCOSE INTOLERANCE 08/28/2009  . HERPES SIMPLEX, UNCOMPLICATED 4/76/5465  . Hyperlipidemia 08/28/2015  . HYPERTHYROIDISM 05/23/2007  . Hypoactive thyroid   . HYPOTHYROIDISM 05/09/2008  . Impaired glucose tolerance 01/28/2011  . INSOMNIA, HX OF 05/23/2007  . Joint pain   . LATERAL EPICONDYLITIS, RIGHT 03/10/2009  . LIPOMA 10/22/2010  . NECK PAIN 11/10/2010  . OBESITY 05/23/2007  . Osteopenia 09/28/2018  . Plantar fasciitis    Both feet  . Pre-diabetes   . SHOULDER PAIN, LEFT 05/17/2010  . SINUSITIS- ACUTE-NOS 11/03/2008  . SKIN LESION 11/10/2010  . Sleep apnea   . Stage III chronic kidney disease    Stage III  . Unspecified Chest Pain 07/25/2008  . UNSPECIFIED URTICARIA 06/04/2009  . URI 08/28/2009  . UTI 04/29/2008  . VITAMIN D DEFICIENCY 08/28/2009  . Wheezing 07/12/2010   Past Surgical History:  Procedure Laterality Date  . COLONOSCOPY    .  ENDOMETRIAL ABLATION    . LAMINECTOMY  1996  . LAPAROTOMY     exploratory  . lower back surgery  07/06/2017   cyst  . POLYPECTOMY    . s/p endometrial ablation  12/06  . TUBAL LIGATION    . UTERINE FIBROID SURGERY    . WISDOM TOOTH EXTRACTION     Social History   Socioeconomic History  . Marital status: Divorced    Spouse name: Not on file  . Number of children: 3  . Years of education: Not on file  . Highest education level: Not on file  Occupational History  . Occupation: SOCIAL WORKER    Employer: [employer]    Comment: [place]  Tobacco Use  . Smoking status: Former Smoker    Packs/day: 0.50    Years: 12.00    Pack years: 6.00    Types: Cigarettes    Quit date: 11/05/1987    Years since quitting: 32.8  . Smokeless tobacco: Never Used  Vaping Use  . Vaping Use: Never used  Substance and Sexual Activity  . Alcohol use: No    Alcohol/week: 1.0 standard drink    Types: 1 Glasses of wine per week    Comment: WINE  . Drug use: No  . Sexual activity: Not on file  Other Topics Concern  . Not on file  Social History Narrative  . Not  on file   Social Determinants of Health   Financial Resource Strain:   . Difficulty of Paying Living Expenses: Not on file  Food Insecurity:   . Worried About Charity fundraiser in the Last Year: Not on file  . Ran Out of Food in the Last Year: Not on file  Transportation Needs:   . Lack of Transportation (Medical): Not on file  . Lack of Transportation (Non-Medical): Not on file  Physical Activity:   . Days of Exercise per Week: Not on file  . Minutes of Exercise per Session: Not on file  Stress:   . Feeling of Stress : Not on file  Social Connections:   . Frequency of Communication with Friends and Family: Not on file  . Frequency of Social Gatherings with Friends and Family: Not on file  . Attends Religious Services: Not on file  . Active Member of Clubs or Organizations: Not on file  . Attends Theatre manager Meetings: Not on file  . Marital Status: Not on file   Allergies  Allergen Reactions  . Shellfish Allergy Anaphylaxis  . Clarithromycin Nausea And Vomiting  . Latex Itching and Other (See Comments)    ITCHING AND COLD SORES AROUND MOUTH WHEN LATEX GLOVES USED BY THE DENTIST/HYGENTIST  . Metronidazole Nausea And Vomiting  . Zostavax [Zoster Vaccine Live]     Arm swelled  . Adhesive [Tape] Dermatitis    Plastic tape   . Lisinopril Cough  . Other Dermatitis    Plastic tape   . Peanut Oil     Pt says it's ok   Family History  Problem Relation Age of Onset  . Diabetes Cousin   . Hypertension Cousin   . Heart disease Cousin   . Heart disease Mother        Rheumatic heart disease  . Depression Mother   . COPD Father   . Hypertension Father   . Cancer Maternal Grandmother        Breast  . Diabetes Sister   . Kidney disease Sister     Current Outpatient Medications (Endocrine & Metabolic):  .  levothyroxine (SYNTHROID) 112 MCG tablet, TAKE 1 TABLET BY MOUTH DAILY BEFORE BREAKFAST  Current Outpatient Medications (Cardiovascular):  .  losartan (COZAAR) 25 MG tablet, Take 0.5 tablets (12.5 mg total) by mouth daily. .  pravastatin (PRAVACHOL) 40 MG tablet, Take 1 tablet (40 mg total) by mouth daily. [name]  torsemide (DEMADEX) 20 MG tablet, TAKE 2 TABLETS IN THE MORNING AND 1 IN THE EVENING. OK TO TAKE AN EXTRA DAILY AS NEEDED FOR WEIGHT GAIN  Current Outpatient Medications (Respiratory):  .  budesonide-formoterol (SYMBICORT) 160-4.5 MCG/ACT inhaler, Inhale 2 puffs into the lungs 2 (two) times daily. .  fexofenadine (ALLEGRA) 180 MG tablet, Take 180 mg by mouth daily.   .  fluticasone (FLONASE) 50 MCG/ACT nasal spray, Place 2 sprays into both nostrils daily. (Patient taking differently: Place 2 sprays into both nostrils daily as needed for allergies. ) .  guaiFENesin (MUCINEX) 600 MG 12 hr tablet, Take 2 tablets (1,200 mg total) by mouth 2 (two) times daily as needed for to  loosen phlegm.  Current Outpatient Medications (Analgesics):  .  acetaminophen (TYLENOL) 650 MG CR tablet, Take by mouth. [name]  allopurinol (ZYLOPRIM) 100 MG tablet, Take 2 tablets (200 mg total) by mouth daily. Take 200 mg by mouth daily. [name]  aspirin 81 MG EC tablet, Take 81 mg by mouth daily.   [name]  traMADol (ULTRAM) 50 MG tablet, Take 1 tablet (50 mg total) by mouth every 6 (six) hours as needed. (Patient taking differently: Take 50 mg by mouth every 6 (six) hours as needed (pain). )   Current Outpatient Medications (Other):  [name]  buPROPion (WELLBUTRIN XL) 150 MG 24 hr tablet, Take 150 mg by mouth daily. .  Cholecalciferol 25 MCG (1000 UT) tablet, Take by mouth. .  ergocalciferol (VITAMIN D2) 1.25 MG (50000 UT) capsule, ergocalciferol (vitamin D2) 1,250 mcg (50,000 unit) capsule .  fluconazole (DIFLUCAN) 150 MG tablet, TAKE 1 TABLET BY MOUTH EVERY 3 DAYS AS NEEDED .  gabapentin (NEURONTIN) 100 MG capsule, Take 100 mg by mouth 2 (two) times daily. [name]  gabapentin (NEURONTIN) 300 MG capsule, Take 300 mg by mouth 3 (three) times daily. [name]  KLOR-CON M20 20 MEQ tablet, Take 1 tablet (20 mEq total) by mouth daily. OK TO TAKE AN EXTRA ON DAYS YOU TAKE EXTRA TORSEMIDE .  Menthol-Methyl Salicylate (MUSCLE RUB) 10-15 % CREA, Apply 1 application topically as needed for muscle pain. .  Multiple Vitamin (MULTIVITAMIN) capsule, Take 1 capsule by mouth daily.   [name]  omeprazole (PRILOSEC) 20 MG capsule, Take 20 mg by mouth daily as needed (acid reflux).  [name]  PENNSAID 2 % SOLN, APPLY 2 PUMPS TOPICALLY TO THE AFFECTED AREA(S) TWICE DAILY AS DIRECTED .  valACYclovir (VALTREX) 1000 MG tablet, Take 500 mg by mouth daily as needed (cold sores).  .  Vilazodone HCl (VIIBRYD) 10 MG TABS, Take 1 tablet (10 mg total) by mouth daily.   Reviewed prior external information including notes and imaging from  primary care provider As well as notes that were available from care everywhere and other healthcare systems.  Past medical  history, social, surgical and family history all reviewed in electronic medical record.  No pertanent information unless stated regarding to the chief complaint.   Review of Systems:  No headache, visual changes, nausea, vomiting, diarrhea, constipation, dizziness, abdominal pain, skin rash, fevers, chills, night sweats, weight loss, swollen lymph nodes, body aches, joint swelling, chest pain, shortness of breath, mood changes. POSITIVE muscle aches  Objective  There were no vitals taken for this visit.   General: No apparent distress alert and oriented x3 mood and affect normal, dressed appropriately.  HEENT: Pupils equal, extraocular movements intact  Respiratory: Patient's speak in full sentences and does not appear short of breath  Cardiovascular: No lower extremity edema, non tender, no erythema  Neuro: Cranial nerves II through XII are intact, neurovascularly intact in all extremities with 2+ DTRs and 2+ pulses.  Gait normal with good balance and coordination.  MSK:  Non tender with full range of motion and good stability and symmetric strength and tone of shoulders, elbows, wrist, hip, knee and ankles bilaterally.     Impression and Recommendations:     The above documentation has been reviewed and is accurate and complete Lyndal Pulley, DO

## 2020-08-18 ENCOUNTER — Ambulatory Visit: Payer: BC Managed Care – PPO | Admitting: Family Medicine

## 2020-08-20 ENCOUNTER — Telehealth: Payer: Self-pay | Admitting: Internal Medicine

## 2020-08-20 NOTE — Telephone Encounter (Signed)
   Patient called to report wheezing that causes her to cough. Declined to speak with [facility] Declined same day appointment with other providers, requested to see only Dr Jenny Reichmann or Valere Dross

## 2020-08-21 ENCOUNTER — Encounter: Payer: Self-pay | Admitting: Family

## 2020-08-21 ENCOUNTER — Ambulatory Visit: Payer: BC Managed Care – PPO | Admitting: Family

## 2020-08-21 ENCOUNTER — Other Ambulatory Visit: Payer: Self-pay

## 2020-08-21 VITALS — BP 128/74 | HR 90 | Temp 98.3°F | Ht 64.5 in | Wt 247.0 lb

## 2020-08-21 DIAGNOSIS — J4521 Mild intermittent asthma with (acute) exacerbation: Secondary | ICD-10-CM

## 2020-08-21 DIAGNOSIS — J45909 Unspecified asthma, uncomplicated: Secondary | ICD-10-CM

## 2020-08-21 MED ORDER — LEVOFLOXACIN 500 MG PO TABS: 500.0000 mg | ORAL_TABLET | Freq: Every day | ORAL | 0 refills | Status: DC

## 2020-08-21 MED ORDER — PREDNISONE 20 MG PO TABS: 20.0000 mg | ORAL_TABLET | Freq: Every day | ORAL | 0 refills | Status: DC

## 2020-08-21 MED ORDER — HYDROCOD POLST-CPM POLST ER 10-8 MG/5ML PO SUER: 5.0000 mL | Freq: Two times a day (BID) | ORAL | 0 refills | Status: DC | PRN

## 2020-08-21 NOTE — Progress Notes (Signed)
Suzanne Hansen is a 64 y.o. female with the following history as recorded in EpicCare:  Patient Active Problem List   Diagnosis Date Noted  . Memory difficulties 05/13/2020  . Essential hypertension 02/26/2020  . Chronic diastolic heart failure ([place]) 01/22/2020  . Yeast vaginitis 10/12/2019  . Overactive bladder 10/02/2019  . Spinal stenosis at L4-L5 level 09/11/2019  . Effusion of left knee 05/31/2019  . Pain and swelling of left lower leg 05/31/2019  . Left otitis media 12/17/2018  . At risk for diabetes mellitus 11/08/2018  . Arthritis 10/29/2018  . Cervical radiculopathy at C6 10/23/2018  . Osteopenia 09/28/2018  . Elevated blood uric acid level 09/28/2018  . Polyarthralgia 09/28/2018  . Paroxysmal atrial fibrillation ([place]) 09/18/2018  . Prediabetes 08/13/2018  . OSA on CPAP 08/13/2018  . CKD (chronic kidney disease) stage 3, GFR 30-59 ml/min (HCC) 06/26/2018  . Rash 06/26/2018  . Wheezing 06/26/2018  . Left hip pain 05/27/2018  . Bicipital tendinitis of right shoulder 05/16/2018  . Bursitis of left hip 05/16/2018  . GERD (gastroesophageal reflux disease) 09/27/2017  . Acute shoulder bursitis, left 08/28/2017  . Lumbosacral radiculopathy at L4 06/14/2017  . 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 Easy bruising 03/29/2017  . Left leg paresthesias 03/29/2017  . Muscle cramping 03/20/2017  . 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 Pain in lower jaw 11/10/2016  . Cough 11/06/2016  . Vaginitis 09/17/2016  . Left wrist pain 09/16/2016  . Hypercoagulable state ([place]) 06/09/2016  . Iron deficiency 06/09/2016  . Lumbar disc disease 06/09/2016  . Hypersomnia with sleep apnea 02/15/2016  . Extrinsic asthma 02/15/2016  . Ankle edema 02/15/2016  . Degenerative arthritis of knee, bilateral 01/21/2016  . Asthma with exacerbation 10/20/2015  . Acute sinus infection 09/29/2015  . Bilateral calf pain 08/31/2015  . Hyperlipidemia  08/28/2015  . Left knee pain 08/28/2015  . Varicose veins with pain 08/28/2015  . Peripheral edema 08/13/2015  . Injection site reaction 06/02/2015  . PHN (postherpetic neuralgia) 05/29/2015  . Shingles 06/10/2014  . Skin nodule 11/27/2012  . Encounter for well adult exam with abnormal findings 01/28/2011  . LIPOMA 10/22/2010  . Vitamin D deficiency 08/28/2009  . Hypothyroidism 05/09/2008  . Allergic rhinitis 05/09/2008  . HERPES ZOSTER W/NERVOUS COMPLICATION NEC 67/34/1937  . Hyperthyroidism 05/23/2007  . OBESITY 05/23/2007  . Anxiety state 05/23/2007  . Asthma 05/23/2007  . ENDOMETRIOSIS NOS 05/23/2007  . INSOMNIA, HX OF 05/23/2007    Current Outpatient Medications  Medication Sig Dispense Refill  . acetaminophen (TYLENOL) 650 MG CR tablet Take by mouth.    [name] allopurinol (ZYLOPRIM) 100 MG tablet Take 2 tablets (200 mg total) by mouth daily. Take 200 mg by mouth daily. 180 tablet 3  . aspirin 81 MG EC tablet Take 81 mg by mouth daily.      . budesonide-formoterol (SYMBICORT) 160-4.5 MCG/ACT inhaler Inhale 2 puffs into the lungs 2 (two) times daily. 10.2 g 6  . buPROPion (WELLBUTRIN XL) 150 MG 24 hr tablet Take 150 mg by mouth daily.    . Cholecalciferol 25 MCG (1000 UT) tablet Take by mouth.    . ergocalciferol (VITAMIN D2) 1.25 MG (50000 UT) capsule ergocalciferol (vitamin D2) 1,250 mcg (50,000 unit) capsule    . fexofenadine (ALLEGRA) 180 MG tablet Take 180 mg by mouth daily.      . fluconazole (DIFLUCAN) 150 MG tablet TAKE 1 TABLET BY MOUTH EVERY 3 DAYS AS NEEDED 2 tablet 1  .  fluticasone (FLONASE) 50 MCG/ACT nasal spray Place 2 sprays into both nostrils daily. (Patient taking differently: Place 2 sprays into both nostrils daily as needed for allergies. ) 16 g 6  . gabapentin (NEURONTIN) 100 MG capsule Take 100 mg by mouth 2 (two) times daily.    [name] gabapentin (NEURONTIN) 300 MG capsule Take 300 mg by mouth 3 (three) times daily.    [name] guaiFENesin (MUCINEX) 600 MG 12 hr tablet  Take 2 tablets (1,200 mg total) by mouth 2 (two) times daily as needed for to loosen phlegm. 60 tablet 1  . KLOR-CON M20 20 MEQ tablet Take 1 tablet (20 mEq total) by mouth daily. OK TO TAKE AN EXTRA ON DAYS YOU TAKE EXTRA TORSEMIDE 70 tablet 5  . levothyroxine (SYNTHROID) 112 MCG tablet TAKE 1 TABLET BY MOUTH DAILY BEFORE BREAKFAST 30 tablet 0  . losartan (COZAAR) 25 MG tablet Take 0.5 tablets (12.5 mg total) by mouth daily. 45 tablet 2  . Menthol-Methyl Salicylate (MUSCLE RUB) 10-15 % CREA Apply 1 application topically as needed for muscle pain.    . Multiple Vitamin (MULTIVITAMIN) capsule Take 1 capsule by mouth daily.      [name] omeprazole (PRILOSEC) 20 MG capsule Take 20 mg by mouth daily as needed (acid reflux).     [name] PENNSAID 2 % SOLN APPLY 2 PUMPS TOPICALLY TO THE AFFECTED AREA(S) TWICE DAILY AS DIRECTED 112 g 2  . pravastatin (PRAVACHOL) 40 MG tablet Take 1 tablet (40 mg total) by mouth daily. 90 tablet 0  . torsemide (DEMADEX) 20 MG tablet TAKE 2 TABLETS IN THE MORNING AND 1 IN THE EVENING. OK TO TAKE AN EXTRA DAILY AS NEEDED FOR WEIGHT GAIN 60 tablet 5  . traMADol (ULTRAM) 50 MG tablet Take 1 tablet (50 mg total) by mouth every 6 (six) hours as needed. (Patient taking differently: Take 50 mg by mouth every 6 (six) hours as needed (pain). ) 30 tablet 0  . valACYclovir (VALTREX) 1000 MG tablet Take 500 mg by mouth daily as needed (cold sores).   0  . Vilazodone HCl (VIIBRYD) 10 MG TABS Take 1 tablet (10 mg total) by mouth daily. 30 tablet 0  . albuterol (VENTOLIN HFA) 108 (90 Base) MCG/ACT inhaler Inhale 2 puffs into the lungs every 4 (four) hours as needed.    . chlorpheniramine-HYDROcodone (TUSSIONEX PENNKINETIC ER) 10-8 MG/5ML SUER Take 5 mLs by mouth every 12 (twelve) hours as needed for cough. 115 mL 0  . levofloxacin (LEVAQUIN) 500 MG tablet Take 1 tablet (500 mg total) by mouth daily. 7 tablet 0  . predniSONE (DELTASONE) 20 MG tablet Take 1 tablet (20 mg total) by mouth daily with  breakfast. 5 tablet 0   No current facility-administered medications for this visit.    Allergies: Shellfish allergy, Clarithromycin, Latex, Metronidazole, Zostavax [zoster vaccine live], Adhesive [tape], Lisinopril, Other, and Peanut oil  Past Medical History:  Diagnosis Date  . ALLERGIC RHINITIS 05/09/2008  . Allergy   . ANXIETY 05/23/2007  . ARM PAIN, LEFT 10/23/2009  . Arthritis   . Asthma   . ASTHMA 05/23/2007  . Back pain   . Cardiomyopathy ([place]) 09/08/2015  . Chest pain   . CHF (congestive heart failure) ([place])   . Chronic diastolic heart failure ([place]) 01/22/2020  . Constipation   . CONTACT DERMATITIS 06/09/2009  . Diabetes mellitus without complication ([place])    type II  . Dyspnea    allergies-dust, cig smoke, rag weeds  . ENDOMETRIOSIS NOS 05/23/2007  .  FACIAL PAIN 06/02/2010  . Food allergy   . FREQUENCY, URINARY 05/17/2010  . GERD (gastroesophageal reflux disease)   . GLUCOSE INTOLERANCE 08/28/2009  . HERPES SIMPLEX, UNCOMPLICATED 04/21/4579  . Hyperlipidemia 08/28/2015  . HYPERTHYROIDISM 05/23/2007  . Hypoactive thyroid   . HYPOTHYROIDISM 05/09/2008  . Impaired glucose tolerance 01/28/2011  . INSOMNIA, HX OF 05/23/2007  . Joint pain   . LATERAL EPICONDYLITIS, RIGHT 03/10/2009  . LIPOMA 10/22/2010  . NECK PAIN 11/10/2010  . OBESITY 05/23/2007  . Osteopenia 09/28/2018  . Plantar fasciitis    Both feet  . Pre-diabetes   . SHOULDER PAIN, LEFT 05/17/2010  . SINUSITIS- ACUTE-NOS 11/03/2008  . SKIN LESION 11/10/2010  . Sleep apnea   . Stage III chronic kidney disease ([place])    Stage III  . Unspecified Chest Pain 07/25/2008  . UNSPECIFIED URTICARIA 06/04/2009  . URI 08/28/2009  . UTI 04/29/2008  . VITAMIN D DEFICIENCY 08/28/2009  . Wheezing 07/12/2010    Past Surgical History:  Procedure Laterality Date  . COLONOSCOPY    . ENDOMETRIAL ABLATION    . LAMINECTOMY  1996  . LAPAROTOMY     exploratory  . lower back surgery  07/06/2017   cyst  . POLYPECTOMY    . s/p endometrial  ablation  12/06  . TUBAL LIGATION    . UTERINE FIBROID SURGERY    . WISDOM TOOTH EXTRACTION      Family History  Problem Relation Age of Onset  . Diabetes Cousin   . Hypertension Cousin   . Heart disease Cousin   . Heart disease Mother        Rheumatic heart disease  . Depression Mother   . COPD Father   . Hypertension Father   . Cancer Maternal Grandmother        Breast  . Diabetes Sister   . Kidney disease Sister     Social History   Tobacco Use  . Smoking status: Former Smoker    Packs/day: 0.50    Years: 12.00    Pack years: 6.00    Types: Cigarettes    Quit date: 11/05/1987    Years since quitting: 32.8  . Smokeless tobacco: Never Used  Substance Use Topics  . Alcohol use: No    Alcohol/week: 1.0 standard drink    Types: 1 Glasses of wine per week    Comment: WINE    Subjective:  Concern for flare of asthmatic bronchitis and wheezing x 1 week; notes she started with symptoms a few weeks ago and get this typically in the fall; requesting treatment with Levaquin, Prednisone and tussionex that her PCP normally uses; she is currently taking her Symbicort and albuterol as directed;  No fever; family members have had similar symptoms; no concerns for COVID;     Objective:  Vitals:   08/21/20 1126  BP: 128/74  Pulse: 90  Temp: 98.3 F (36.8 C)  TempSrc: Oral  SpO2: 98%  Weight: 247 lb (112 kg)  Height: 5' 4.5" (1.638 m)    General: Well developed, well nourished, in no acute distress  Skin : Warm and dry.  Head: Normocephalic and atraumatic  Eyes: Sclera and conjunctiva clear; pupils round and reactive to light; extraocular movements intact  Ears: External normal; canals clear; tympanic membranes normal  Oropharynx: Pink, supple. No suspicious lesions  Neck: Supple without thyromegaly, adenopathy  Lungs: Respirations unlabored; coarse sounding CVS exam: normal rate and regular rhythm.  Neurologic: Alert and oriented; speech intact; face symmetrical; moves  all extremities well; CNII-XII intact without focal deficit   Assessment:  1. Acute asthmatic bronchitis   2. Mild intermittent asthma with exacerbation     Plan:  Continue Symbicort and albuterol as directed; Rx for Levaquin 500 mg qd x 7 days, Prednisone 20 mg qd x 5 days and Tussionex cough syrup; increase fluids, rest and follow-up worse, no better.   No follow-ups on file.  No orders of the defined types were placed in this encounter.   Requested Prescriptions   Signed Prescriptions Disp Refills  . levofloxacin (LEVAQUIN) 500 MG tablet 7 tablet 0    Sig: Take 1 tablet (500 mg total) by mouth daily.  . predniSONE (DELTASONE) 20 MG tablet 5 tablet 0    Sig: Take 1 tablet (20 mg total) by mouth daily with breakfast.  . chlorpheniramine-HYDROcodone (TUSSIONEX PENNKINETIC ER) 10-8 MG/5ML SUER 115 mL 0    Sig: Take 5 mLs by mouth every 12 (twelve) hours as needed for cough.

## 2020-09-17 DIAGNOSIS — M47816 Spondylosis without myelopathy or radiculopathy, lumbar region: Secondary | ICD-10-CM | POA: Diagnosis not present

## 2020-09-17 DIAGNOSIS — I10 Essential (primary) hypertension: Secondary | ICD-10-CM | POA: Diagnosis not present

## 2020-09-17 DIAGNOSIS — Z6841 Body Mass Index (BMI) 40.0 and over, adult: Secondary | ICD-10-CM | POA: Diagnosis not present

## 2020-09-21 ENCOUNTER — Other Ambulatory Visit: Payer: Self-pay

## 2020-09-21 ENCOUNTER — Telehealth: Payer: Self-pay | Admitting: Internal Medicine

## 2020-09-21 DIAGNOSIS — N183 Chronic kidney disease, stage 3 unspecified: Secondary | ICD-10-CM

## 2020-09-21 DIAGNOSIS — E785 Hyperlipidemia, unspecified: Secondary | ICD-10-CM

## 2020-09-21 DIAGNOSIS — R7303 Prediabetes: Secondary | ICD-10-CM

## 2020-09-21 MED ORDER — LEVOTHYROXINE SODIUM 112 MCG PO TABS: ORAL_TABLET | ORAL | 0 refills | Status: DC

## 2020-09-21 NOTE — Telephone Encounter (Signed)
[place] labs ordered, please assist pt with lab appointment to have done prior to visit

## 2020-09-21 NOTE — Telephone Encounter (Signed)
1.Medication Requested:levothyroxine (SYNTHROID) 112 MCG tablet    2. Pharmacy (Name, [place], [place]): [facility] [address], [place], [place] [address]  3. On Med List: yes   4. Last Visit with PCP: 12.23.2020  5. Next visit date with PCP: 1.6.2022  Agent: Please be advised that RX refills may take up to 3 business days. We ask that you follow-up with your pharmacy.  Patient was also wondering if lab orders could be put in before her 1.6.22 appointment.

## 2020-09-21 NOTE — Telephone Encounter (Signed)
Sent to Dr. Jenny Reichmann for pt labs to be put in before her appointment on 10/15/2020.

## 2020-10-01 ENCOUNTER — Telehealth: Payer: Self-pay | Admitting: Internal Medicine

## 2020-10-01 NOTE — Telephone Encounter (Signed)
   1.Medication Requested: albuterol (VENTOLIN HFA) 108 (90 Base) MCG/ACT inhaler budesonide-formoterol (SYMBICORT) 160-4.5 MCG/ACT inhaler   2. Pharmacy (Name, [place]):[id number] - [place], [place]  3. On Med List: yes  4. Last Visit with PCP:  5. Next visit date with PCP:10/15/20   Agent: Please be advised that RX refills may take up to 3 business days. We ask that you follow-up with your pharmacy.

## 2020-10-05 ENCOUNTER — Other Ambulatory Visit: Payer: Self-pay

## 2020-10-05 MED ORDER — ALBUTEROL SULFATE HFA 108 (90 BASE) MCG/ACT IN AERS: 2.0000 | INHALATION_SPRAY | RESPIRATORY_TRACT | 3 refills | Status: DC | PRN

## 2020-10-05 MED ORDER — BUDESONIDE-FORMOTEROL FUMARATE 160-4.5 MCG/ACT IN AERO: 2.0000 | INHALATION_SPRAY | Freq: Two times a day (BID) | RESPIRATORY_TRACT | 6 refills | Status: DC

## 2020-10-05 NOTE — Telephone Encounter (Signed)
Done Erx.

## 2020-10-06 ENCOUNTER — Ambulatory Visit: Payer: BC Managed Care – PPO | Admitting: Family Medicine

## 2020-10-06 ENCOUNTER — Other Ambulatory Visit: Payer: Self-pay

## 2020-10-06 ENCOUNTER — Encounter: Payer: Self-pay | Admitting: Family Medicine

## 2020-10-06 ENCOUNTER — Other Ambulatory Visit: Payer: Self-pay | Admitting: Internal Medicine

## 2020-10-06 VITALS — BP 114/72 | HR 82 | Ht 64.5 in | Wt 250.0 lb

## 2020-10-06 DIAGNOSIS — M170 Bilateral primary osteoarthritis of knee: Secondary | ICD-10-CM

## 2020-10-06 DIAGNOSIS — E785 Hyperlipidemia, unspecified: Secondary | ICD-10-CM

## 2020-10-06 DIAGNOSIS — R7303 Prediabetes: Secondary | ICD-10-CM | POA: Diagnosis not present

## 2020-10-06 DIAGNOSIS — N183 Chronic kidney disease, stage 3 unspecified: Secondary | ICD-10-CM

## 2020-10-06 DIAGNOSIS — M2550 Pain in unspecified joint: Secondary | ICD-10-CM | POA: Diagnosis not present

## 2020-10-06 LAB — URINALYSIS, ROUTINE W REFLEX MICROSCOPIC
Bilirubin Urine: NEGATIVE
Hgb urine dipstick: NEGATIVE
Ketones, ur: NEGATIVE
Leukocytes,Ua: NEGATIVE
Nitrite: NEGATIVE
Specific Gravity, Urine: 1.025 (ref 1.000–1.030)
Total Protein, Urine: NEGATIVE
Urine Glucose: NEGATIVE
Urobilinogen, UA: 0.2 (ref 0.0–1.0)
pH: 6 (ref 5.0–8.0)

## 2020-10-06 LAB — CBC WITH DIFFERENTIAL/PLATELET
Basophils Absolute: 0 10*3/uL (ref 0.0–0.1)
Basophils Relative: 0.6 % (ref 0.0–3.0)
Eosinophils Absolute: 0.1 10*3/uL (ref 0.0–0.7)
Eosinophils Relative: 1.2 % (ref 0.0–5.0)
HCT: 41 % (ref 36.0–46.0)
Hemoglobin: 13.4 g/dL (ref 12.0–15.0)
Lymphocytes Relative: 28.5 % (ref 12.0–46.0)
Lymphs Abs: 1.9 10*3/uL (ref 0.7–4.0)
MCHC: 32.6 g/dL (ref 30.0–36.0)
MCV: 91.4 fl (ref 78.0–100.0)
Monocytes Absolute: 0.5 10*3/uL (ref 0.1–1.0)
Monocytes Relative: 7.2 % (ref 3.0–12.0)
Neutro Abs: 4.3 10*3/uL (ref 1.4–7.7)
Neutrophils Relative %: 62.5 % (ref 43.0–77.0)
Platelets: 287 10*3/uL (ref 150.0–400.0)
RBC: 4.48 Mil/uL (ref 3.87–5.11)
RDW: 14.9 % (ref 11.5–15.5)
WBC: 6.8 10*3/uL (ref 4.0–10.5)

## 2020-10-06 LAB — URIC ACID: Uric Acid, Serum: 5.8 mg/dL (ref 2.4–7.0)

## 2020-10-06 LAB — LIPID PANEL
Cholesterol: 204 mg/dL — ABNORMAL HIGH (ref 0–200)
HDL: 72.5 mg/dL (ref >39.00)
LDL Cholesterol: 107 mg/dL — ABNORMAL HIGH (ref 0–99)
NonHDL: 131.83
Total CHOL/HDL Ratio: 3
Triglycerides: 125 mg/dL (ref 0.0–149.0)
VLDL: 25 mg/dL (ref 0.0–40.0)

## 2020-10-06 LAB — BASIC METABOLIC PANEL
BUN: 17 mg/dL (ref 6–23)
CO2: 30 mEq/L (ref 19–32)
Calcium: 10 mg/dL (ref 8.4–10.5)
Chloride: 102 mEq/L (ref 96–112)
Creatinine, Ser: 1.29 mg/dL — ABNORMAL HIGH (ref 0.40–1.20)
GFR: 44.17 mL/min — ABNORMAL LOW (ref >60.00)
Glucose, Bld: 130 mg/dL — ABNORMAL HIGH (ref 70–99)
Potassium: 3.9 mEq/L (ref 3.5–5.1)
Sodium: 140 mEq/L (ref 135–145)

## 2020-10-06 LAB — TSH: TSH: 27.25 u[IU]/mL — ABNORMAL HIGH (ref 0.35–4.50)

## 2020-10-06 LAB — HEPATIC FUNCTION PANEL
ALT: 18 U/L (ref 0–35)
AST: 19 U/L (ref 0–37)
Albumin: 4.2 g/dL (ref 3.5–5.2)
Alkaline Phosphatase: 96 U/L (ref 39–117)
Bilirubin, Direct: 0.1 mg/dL (ref 0.0–0.3)
Total Bilirubin: 0.4 mg/dL (ref 0.2–1.2)
Total Protein: 6.9 g/dL (ref 6.0–8.3)

## 2020-10-06 LAB — HEMOGLOBIN A1C: Hgb A1c MFr Bld: 5.5 % (ref 4.6–6.5)

## 2020-10-06 LAB — VITAMIN D 25 HYDROXY (VIT D DEFICIENCY, FRACTURES): VITD: 27.53 ng/mL — ABNORMAL LOW (ref 30.00–100.00)

## 2020-10-06 LAB — PHOSPHORUS: Phosphorus: 3.2 mg/dL (ref 2.3–4.6)

## 2020-10-06 NOTE — Patient Instructions (Signed)
Injected both knees today Consider MRI of knee if this continues We can consider PRP but will want MRI before that Tart Cherry Extract 1200mg  at night See me again in 3 months

## 2020-10-06 NOTE — Assessment & Plan Note (Signed)
Repeat injection given again today.  Patient has had elevation of uric acid previously and would like to recheck.  Patient may have vertigo up on her allopurinol.  We have watched patient's chronic kidney disease though as well.  We will need to discuss with primary care provider.  Patient's knees have shown that patient does have mild to moderate arthritic changes.  Patient did have an MRI of the left knee though that did show fairly severe patellofemoral arthritic changes.  Patient wants to avoid any type of surgical intervention if possible.  Has not responded extremely well to the viscosupplementation but I do think that it still needs to be considered.  Patient also could potentially try PRP.  We did discuss that because of patient's fall when she did have an MRI we may want to consider repeating the MRI to further evaluate for any type of OCD in the left knee that could be contributing.  Patient will follow up with me again in 3 months.

## 2020-10-06 NOTE — Progress Notes (Signed)
[place] Phone: [phone] Subjective:   Suzanne Hansen, am serving as a scribe for Dr. Hulan Hansen. This visit occurred during the SARS-CoV-2 public health emergency.  Safety protocols were in place, including screening questions prior to the visit, additional usage of staff PPE, and extensive cleaning of exam room while observing appropriate contact time as indicated for disinfecting solutions.   I'm seeing this patient by the request  of:  Suzanne Borg, MD  CC: Knee pain follow-up  [id number]   06/09/2020 Bilateral injections discussed exercises.  Patient has failed the viscosupplementation.  Discussed potential PRP injections and patient will consider that.  Patient wants to avoid surgical intervention as long as possible.  Patient has had an MRI of the left knee showing fairly severe patellofemoral arthritis that is likely contributing.   Update 10/06/2020 Suzanne Hansen is a 64 y.o. female coming in with complaint of bilateral knee injections. Visco denied due to previous failed attempts. L>R. Pain is constant and occurring daily.  Patient states that it is better than the last time she saw Dr. Georgina Hansen.  Patient did have significant amount of swelling initially in that knee.  Has not had as much discomfort anymore still pain on a daily basis.  Some mild increase in instability she feels that the left knee     Patient's knee x-rays have shown mild to moderate osteoarthritic changes.  Also has chondrocalcinosis  Add uric acid to patient labs   Past Medical History:  Diagnosis Date  . ALLERGIC RHINITIS 05/09/2008  . Allergy   . ANXIETY 05/23/2007  . ARM PAIN, LEFT 10/23/2009  . Arthritis   . Asthma   . ASTHMA 05/23/2007  . Back pain   . Cardiomyopathy ([place]) 09/08/2015  . Chest pain   . CHF (congestive heart failure) ([place])   . Chronic diastolic heart failure ([place]) 01/22/2020  . Constipation   .  CONTACT DERMATITIS 06/09/2009  . Diabetes mellitus without complication ([place])    type II  . Dyspnea    allergies-dust, cig smoke, rag weeds  . ENDOMETRIOSIS NOS 05/23/2007  . FACIAL PAIN 06/02/2010  . Food allergy   . FREQUENCY, URINARY 05/17/2010  . GERD (gastroesophageal reflux disease)   . GLUCOSE INTOLERANCE 08/28/2009  . HERPES SIMPLEX, UNCOMPLICATED [id number]  . Hyperlipidemia 08/28/2015  . HYPERTHYROIDISM 05/23/2007  . Hypoactive thyroid   . HYPOTHYROIDISM 05/09/2008  . Impaired glucose tolerance 01/28/2011  . INSOMNIA, HX OF 05/23/2007  . Joint pain   . LATERAL EPICONDYLITIS, RIGHT 03/10/2009  . LIPOMA 10/22/2010  . NECK PAIN 11/10/2010  . OBESITY 05/23/2007  . Osteopenia 09/28/2018  . Plantar fasciitis    Both feet  . Pre-diabetes   . SHOULDER PAIN, LEFT 05/17/2010  . SINUSITIS- ACUTE-NOS 11/03/2008  . SKIN LESION 11/10/2010  . Sleep apnea   . Stage III chronic kidney disease ([place])    Stage III  . Unspecified Chest Pain 07/25/2008  . UNSPECIFIED URTICARIA 06/04/2009  . URI 08/28/2009  . UTI 04/29/2008  . VITAMIN D DEFICIENCY 08/28/2009  . Wheezing 07/12/2010   Past Surgical History:  Procedure Laterality Date  . COLONOSCOPY    . ENDOMETRIAL ABLATION    . LAMINECTOMY  1996  . LAPAROTOMY     exploratory  . lower back surgery  07/06/2017   cyst  . POLYPECTOMY    . s/p endometrial ablation  12/06  . TUBAL LIGATION    . UTERINE FIBROID SURGERY    .  WISDOM TOOTH EXTRACTION     Social History   Socioeconomic History  . Marital status: Divorced    Spouse name: Not on file  . Number of children: 3  . Years of education: Not on file  . Highest education level: Not on file  Occupational History  . Occupation: SOCIAL WORKER    Employer: [employer]    Comment: [place]  Tobacco Use  . Smoking status: Former Smoker    Packs/day: 0.50    Years: 12.00    Pack years: 6.00    Types: Cigarettes    Quit date: 11/05/1987    Years since quitting: 32.9  .  Smokeless tobacco: Never Used  Vaping Use  . Vaping Use: Never used  Substance and Sexual Activity  . Alcohol use: Hansen    Alcohol/week: 1.0 standard drink    Types: 1 Glasses of wine per week    Comment: WINE  . Drug use: Hansen  . Sexual activity: Not on file  Other Topics Concern  . Not on file  Social History Narrative  . Not on file   Social Determinants of Health   Financial Resource Strain: Not on file  Food Insecurity: Not on file  Transportation Needs: Not on file  Physical Activity: Not on file  Stress: Not on file  Social Connections: Not on file   Allergies  Allergen Reactions  . Shellfish Allergy Anaphylaxis  . Clarithromycin Nausea And Vomiting  . Latex Itching and Other (See Comments)    ITCHING AND COLD SORES AROUND MOUTH WHEN LATEX GLOVES USED BY THE DENTIST/HYGENTIST  . Metronidazole Nausea And Vomiting  . Zostavax [Zoster Vaccine Live]     Arm swelled  . Adhesive [Tape] Dermatitis    Plastic tape   . Lisinopril Cough  . Other Dermatitis    Plastic tape   . Peanut Oil     Pt says it's ok   Family History  Problem Relation Age of Onset  . Diabetes Cousin   . Hypertension Cousin   . Heart disease Cousin   . Heart disease Mother        Rheumatic heart disease  . Depression Mother   . COPD Father   . Hypertension Father   . Cancer Maternal Grandmother        Breast  . Diabetes Sister   . Kidney disease Sister     Current Outpatient Medications (Endocrine & Metabolic):  .  levothyroxine (SYNTHROID) 112 MCG tablet, TAKE 1 TABLET BY MOUTH DAILY BEFORE BREAKFAST .  predniSONE (DELTASONE) 20 MG tablet, Take 1 tablet (20 mg total) by mouth daily with breakfast.  Current Outpatient Medications (Cardiovascular):  .  losartan (COZAAR) 25 MG tablet, Take 0.5 tablets (12.5 mg total) by mouth daily. .  pravastatin (PRAVACHOL) 40 MG tablet, Take 1 tablet (40 mg total) by mouth daily. [name]  torsemide (DEMADEX) 20 MG tablet, TAKE 2 TABLETS IN THE MORNING AND 1 IN  THE EVENING. OK TO TAKE AN EXTRA DAILY AS NEEDED FOR WEIGHT GAIN  Current Outpatient Medications (Respiratory):  .  albuterol (VENTOLIN HFA) 108 (90 Base) MCG/ACT inhaler, Inhale 2 puffs into the lungs every 4 (four) hours as needed. .  budesonide-formoterol (SYMBICORT) 160-4.5 MCG/ACT inhaler, Inhale 2 puffs into the lungs 2 (two) times daily. .  chlorpheniramine-HYDROcodone (TUSSIONEX PENNKINETIC ER) 10-8 MG/5ML SUER, Take 5 mLs by mouth every 12 (twelve) hours as needed for cough. .  fexofenadine (ALLEGRA) 180 MG tablet, Take 180 mg by mouth daily. [name]  fluticasone (FLONASE) 50 MCG/ACT nasal spray, Place 2 sprays into both nostrils daily. (Patient taking differently: Place 2 sprays into both nostrils daily as needed for allergies.) .  guaiFENesin (MUCINEX) 600 MG 12 hr tablet, Take 2 tablets (1,200 mg total) by mouth 2 (two) times daily as needed for to loosen phlegm.  Current Outpatient Medications (Analgesics):  .  acetaminophen (TYLENOL) 650 MG CR tablet, Take by mouth. [name]  allopurinol (ZYLOPRIM) 100 MG tablet, Take 2 tablets (200 mg total) by mouth daily. Take 200 mg by mouth daily. [name]  aspirin 81 MG EC tablet, Take 81 mg by mouth daily. .  traMADol (ULTRAM) 50 MG tablet, Take 1 tablet (50 mg total) by mouth every 6 (six) hours as needed. (Patient taking differently: Take 50 mg by mouth every 6 (six) hours as needed (pain).)   Current Outpatient Medications (Other):  [name]  buPROPion (WELLBUTRIN XL) 150 MG 24 hr tablet, Take 150 mg by mouth daily. .  Cholecalciferol 25 MCG (1000 UT) tablet, Take by mouth. .  ergocalciferol (VITAMIN D2) 1.25 MG (50000 UT) capsule, ergocalciferol (vitamin D2) 1,250 mcg (50,000 unit) capsule .  fluconazole (DIFLUCAN) 150 MG tablet, TAKE 1 TABLET BY MOUTH EVERY 3 DAYS AS NEEDED .  gabapentin (NEURONTIN) 100 MG capsule, Take 100 mg by mouth 2 (two) times daily. [name]  gabapentin (NEURONTIN) 300 MG capsule, Take 300 mg by mouth 3 (three) times daily. [name]  KLOR-CON M20 20  MEQ tablet, Take 1 tablet (20 mEq total) by mouth daily. OK TO TAKE AN EXTRA ON DAYS YOU TAKE EXTRA TORSEMIDE .  levofloxacin (LEVAQUIN) 500 MG tablet, Take 1 tablet (500 mg total) by mouth daily. .  Menthol-Methyl Salicylate (MUSCLE RUB) 10-15 % CREA, Apply 1 application topically as needed for muscle pain. .  Multiple Vitamin (MULTIVITAMIN) capsule, Take 1 capsule by mouth daily. [name]  omeprazole (PRILOSEC) 20 MG capsule, Take 20 mg by mouth daily as needed (acid reflux).  [name]  PENNSAID 2 % SOLN, APPLY 2 PUMPS TOPICALLY TO THE AFFECTED AREA(S) TWICE DAILY AS DIRECTED .  valACYclovir (VALTREX) 1000 MG tablet, Take 500 mg by mouth daily as needed (cold sores).  .  Vilazodone HCl (VIIBRYD) 10 MG TABS, Take 1 tablet (10 mg total) by mouth daily.   Reviewed prior external information including notes and imaging from  primary care provider As well as notes that were available from care everywhere and other healthcare systems.  Past medical history, social, surgical and family history all reviewed in electronic medical record.  Hansen pertanent information unless stated regarding to the chief complaint.   Review of Systems:  Hansen headache, visual changes, nausea, vomiting, diarrhea, constipation, dizziness, abdominal pain, skin rash, fevers, chills, night sweats, weight loss, swollen lymph nodes, body aches, joint swelling, chest pain, shortness of breath, mood changes. POSITIVE muscle aches  Objective  Blood pressure 114/72, pulse 82, height 5' 4.5" (1.638 m), weight 250 lb (113.4 kg), SpO2 99 %.   General: Hansen apparent distress alert and oriented x3 mood and affect normal, dressed appropriately.  HEENT: Pupils equal, extraocular movements intact  Respiratory: Patient's speak in full sentences and does not appear short of breath  Cardiovascular: Hansen lower extremity edema, non tender, Hansen erythema  Mild antalgic  Knee: Bilateral valgus deformity noted.  Abnormal thigh to calf ratio.  Tender to palpation  over medial and PF joint line.  Left greater than right Mild limited range of motion in flexion and extension of the left knee compared to the  contralateral side instability with valgus force left greater than right.  painful patellar compression severe on the left. Patellar glide with moderate crepitus. Patellar and quadriceps tendons unremarkable. Hamstring and quadriceps strength is normal.  After informed written and verbal consent, patient was seated on exam table. Right knee was prepped with alcohol swab and utilizing anterolateral approach, patient's right knee space was injected with 4:1  marcaine 0.5%: Kenalog 40mg /dL. Patient tolerated the procedure well without immediate complications.  After informed written and verbal consent, patient was seated on exam table. Left knee was prepped with alcohol swab and utilizing anterolateral approach, patient's left knee space was injected with 4:1  marcaine 0.5%: Kenalog 40mg /dL. Patient tolerated the procedure well without immediate complications.    Impression and Recommendations:     The above documentation has been reviewed and is accurate and complete , DO

## 2020-10-08 ENCOUNTER — Other Ambulatory Visit: Payer: BC Managed Care – PPO

## 2020-10-08 LAB — PTH, INTACT AND CALCIUM
Calcium: 9.8 mg/dL (ref 8.6–10.4)
PTH: 72 pg/mL — ABNORMAL HIGH (ref 14–64)

## 2020-10-13 IMAGING — CT CT L SPINE W/O CM
3 of 4 series · 12 of 33 positions shown, 14 images · non-contrast
Comparison: Plain films lumbar spine 11/12/2019 from [REDACTED]. MRI
lumbar spine 08/07/2019.

CLINICAL DATA: Low back pain. Patient status post L4-5 lumbar
fusion 09/11/2019.

EXAM:
CT LUMBAR SPINE WITHOUT CONTRAST
TECHNIQUE: Multidetector CT imaging of the lumbar spine was performed without
intravenous contrast administration. Multiplanar CT image
reconstructions were also generated.

[Series 3: l-spine 2.00 br40 s3 lspine st · axial · 0.33mm/px · z∈[+1483,+1635]mm · 4 of 116 slices shown, 5 images]
[im 20/116  soft-tissue]
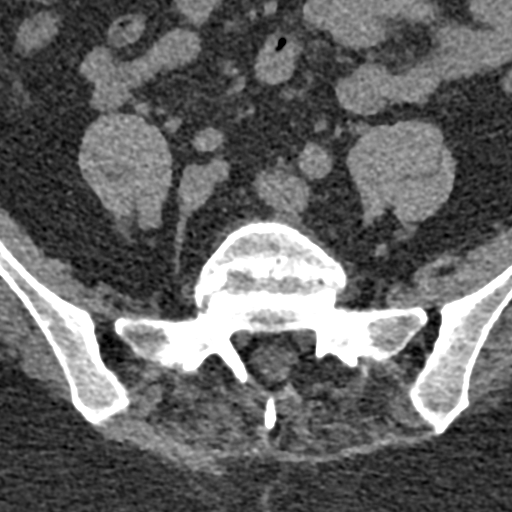
[im 20/116  bone]
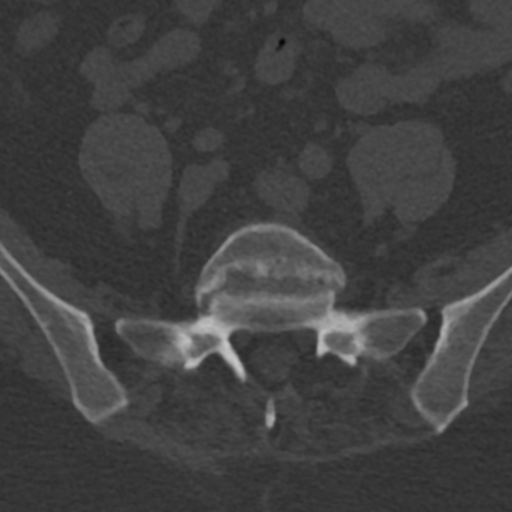
[im 39/116  bone]
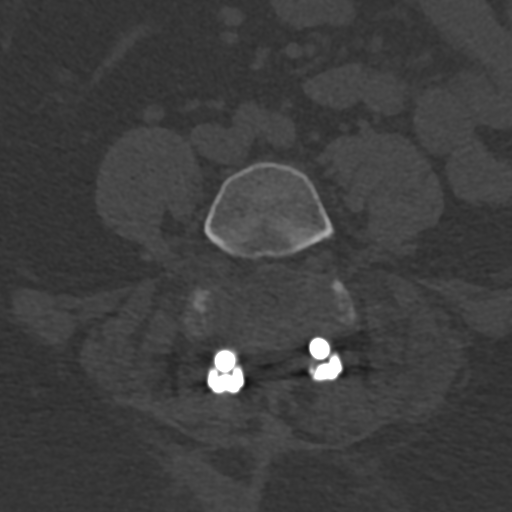
[im 77/116  bone]
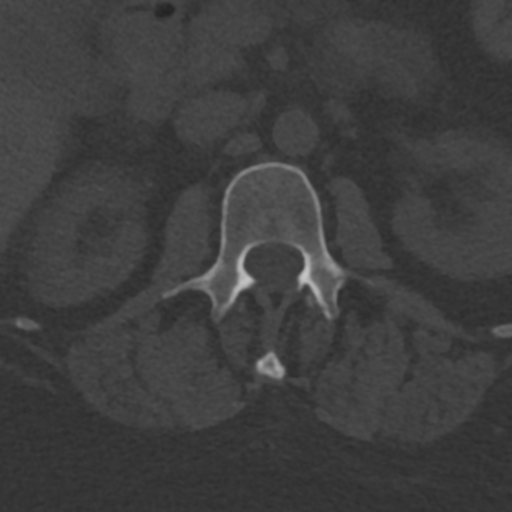
[im 96/116  bone]
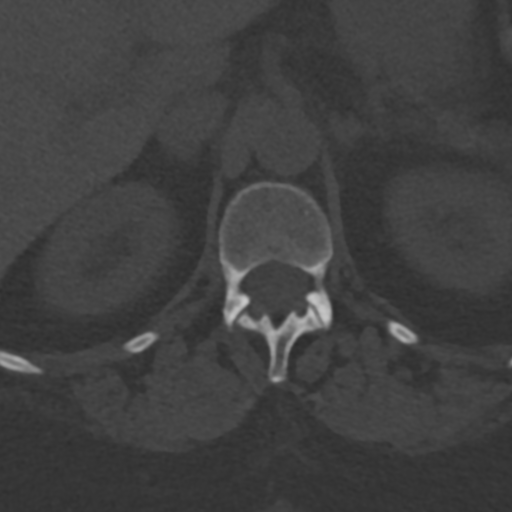

[Series 5: l-spine 2.00 br60 s3 sag bone · sagittal · 0.33mm/px · 5 of 84 slices shown, 6 images]
[im 28/84  bone]
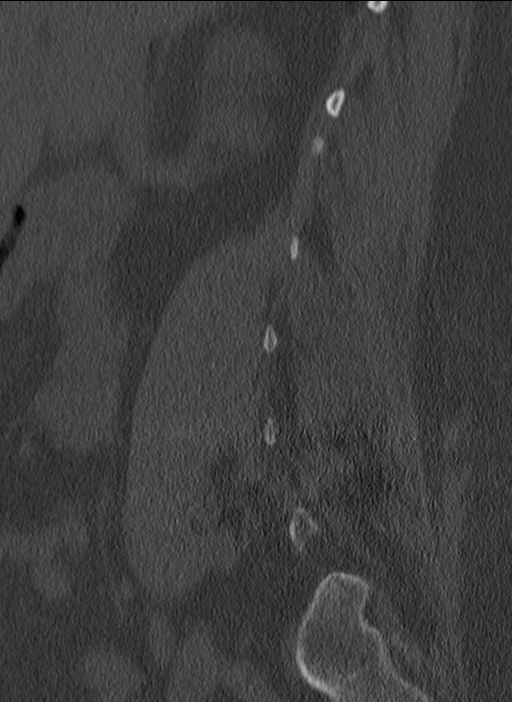
[im 35/84  bone]
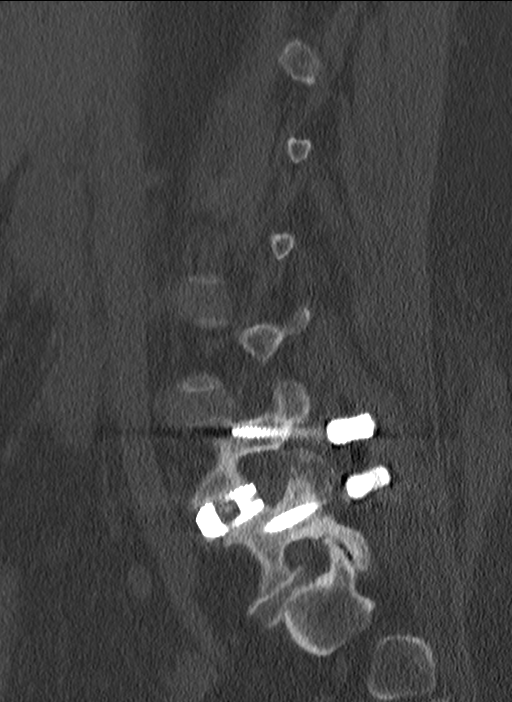
[im 42/84  soft-tissue]
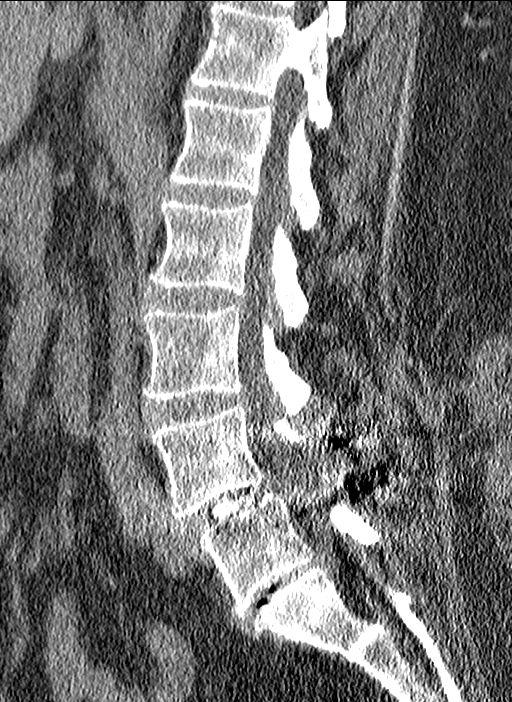
[im 42/84  bone]
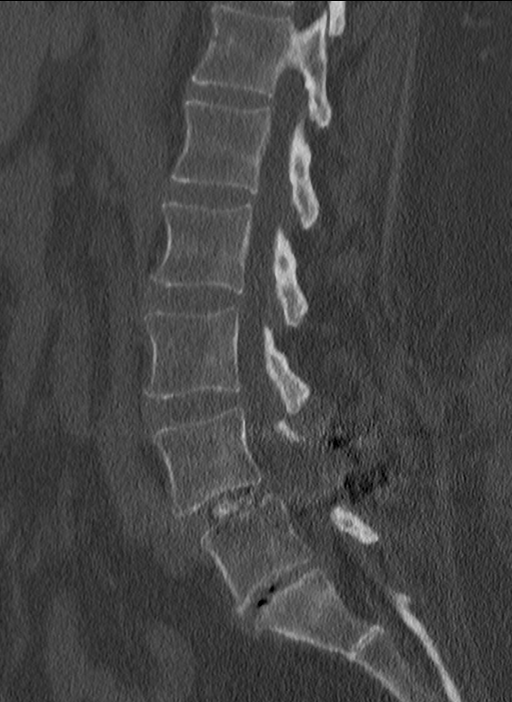
[im 49/84  bone]
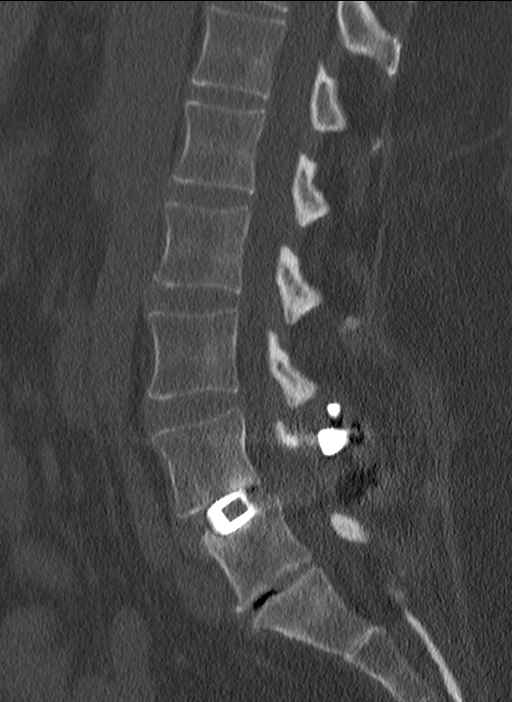
[im 56/84  bone]
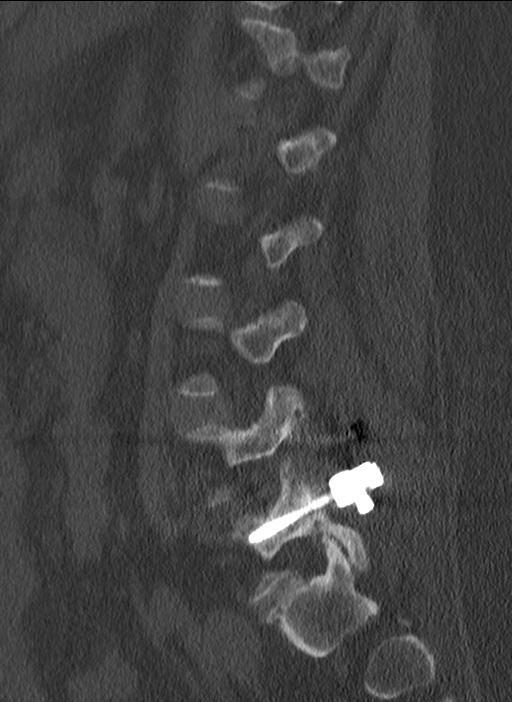

[Series 7: l-spine 2.00 br60 s3 cor bone · coronal · 0.33mm/px · 3 of 84 slices shown]
[im 17/84  bone]
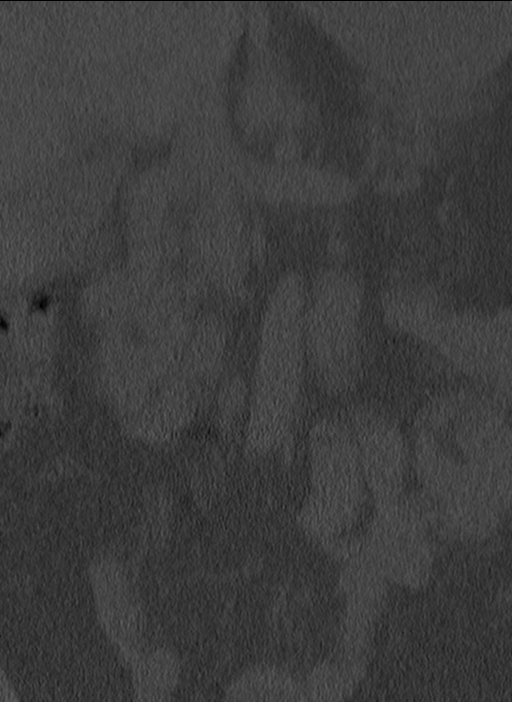
[im 34/84  bone]
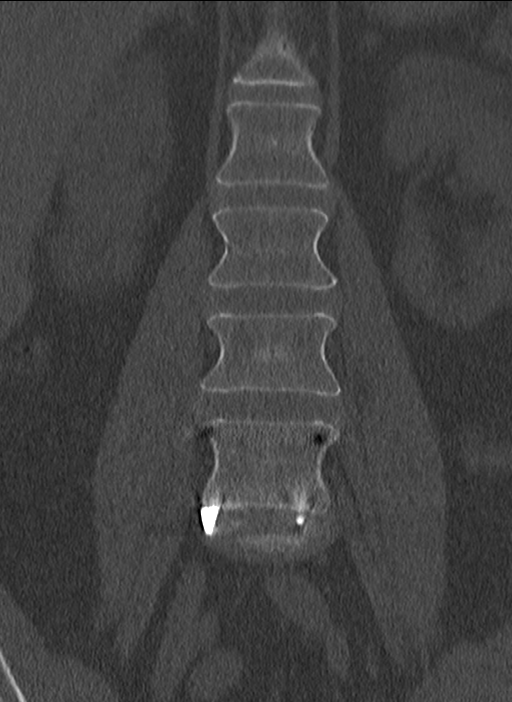
[im 50/84  bone]
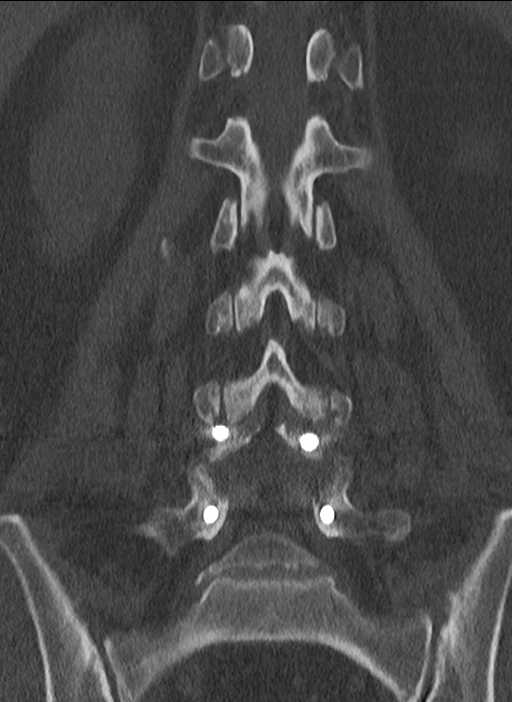

[12 of 33 positions shown; findings below may reference images not displayed]

FINDINGS: Segmentation: Standard.

Alignment: 0.3 cm anterolisthesis L4 on L5 is identified. Alignment
is otherwise normal.

Vertebrae: No acute fracture or focal pathologic process. The
patient has undergone L4-5 laminectomy and fusion since the prior
exam as described below.

Paraspinal and other soft tissues: Negative.

Disc levels: T12-L1: Negative.

L1-2: Negative.

L2-3: Negative.

L3-4: Mild ligamentum flavum thickening and a shallow disc bulge.
The central canal and foramina are open.

L4-5: The patient has undergone laminectomy, bilateral foraminotomy
and posterior and interbody fusion. Pedicle screws and interbody
spacer are well positioned. Hardware is intact without loosening.
Bone graft material at the interbody fusion is coalescing and
incorporating into the endplates. The central canal and foramina are
widely patent.

L5-S1: Loss of disc space height, vacuum disc phenomenon and mild
endplate sclerosis are seen. There is a shallow bulge. The central
canal and foramina are widely patent. The patient is status post
left laminotomy.
IMPRESSION: Status post L4-5 fusion. Hardware is well positioned and intact
without evidence of loosening. Interbody graft material is
coalescing and incorporating into the endplates. The central canal
and foramina are widely patent.

Status post left laminotomy at L5-S1. The central canal and foramina
are widely patent with a shallow disc bulge noted.

## 2020-10-15 ENCOUNTER — Telehealth: Payer: Self-pay | Admitting: Cardiovascular Disease

## 2020-10-15 ENCOUNTER — Ambulatory Visit (INDEPENDENT_AMBULATORY_CARE_PROVIDER_SITE_OTHER): Payer: BC Managed Care – PPO | Admitting: Internal Medicine

## 2020-10-15 ENCOUNTER — Other Ambulatory Visit: Payer: Self-pay

## 2020-10-15 ENCOUNTER — Encounter: Payer: Self-pay | Admitting: Internal Medicine

## 2020-10-15 VITALS — BP 124/72 | HR 63 | Temp 98.3°F | Ht 64.5 in | Wt 240.0 lb

## 2020-10-15 DIAGNOSIS — Z0001 Encounter for general adult medical examination with abnormal findings: Secondary | ICD-10-CM

## 2020-10-15 DIAGNOSIS — Z0000 Encounter for general adult medical examination without abnormal findings: Secondary | ICD-10-CM | POA: Diagnosis not present

## 2020-10-15 DIAGNOSIS — M109 Gout, unspecified: Secondary | ICD-10-CM | POA: Insufficient documentation

## 2020-10-15 DIAGNOSIS — G720 Drug-induced myopathy: Secondary | ICD-10-CM

## 2020-10-15 DIAGNOSIS — N183 Chronic kidney disease, stage 3 unspecified: Secondary | ICD-10-CM | POA: Diagnosis not present

## 2020-10-15 DIAGNOSIS — E785 Hyperlipidemia, unspecified: Secondary | ICD-10-CM

## 2020-10-15 DIAGNOSIS — I10 Essential (primary) hypertension: Secondary | ICD-10-CM

## 2020-10-15 DIAGNOSIS — R7303 Prediabetes: Secondary | ICD-10-CM

## 2020-10-15 DIAGNOSIS — E038 Other specified hypothyroidism: Secondary | ICD-10-CM

## 2020-10-15 DIAGNOSIS — E559 Vitamin D deficiency, unspecified: Secondary | ICD-10-CM

## 2020-10-15 DIAGNOSIS — T466X5A Adverse effect of antihyperlipidemic and antiarteriosclerotic drugs, initial encounter: Secondary | ICD-10-CM

## 2020-10-15 DIAGNOSIS — I5032 Chronic diastolic (congestive) heart failure: Secondary | ICD-10-CM

## 2020-10-15 MED ORDER — BUDESONIDE-FORMOTEROL FUMARATE 160-4.5 MCG/ACT IN AERO: 2.0000 | INHALATION_SPRAY | Freq: Two times a day (BID) | RESPIRATORY_TRACT | 11 refills | Status: DC

## 2020-10-15 MED ORDER — ALBUTEROL SULFATE HFA 108 (90 BASE) MCG/ACT IN AERS: 2.0000 | INHALATION_SPRAY | RESPIRATORY_TRACT | 11 refills | Status: DC | PRN

## 2020-10-15 MED ORDER — ALLOPURINOL 100 MG PO TABS: 200.0000 mg | ORAL_TABLET | Freq: Every day | ORAL | 3 refills | Status: DC

## 2020-10-15 NOTE — Telephone Encounter (Signed)
Earvin Hansen with [facility] is requesting an e-mail stating that the patient is cleared to exercise. He states the e-mail may be sent to [email or web address].  If additional questions, please return call to Earvin Hansen to discuss at [phone].

## 2020-10-15 NOTE — Progress Notes (Signed)
Established Patient Office Visit  Subjective:  Patient ID: Suzanne Hansen, female    DOB: January 10, 1956  Age: 65 y.o. MRN: [id number]       Chief Complaint: (concise statement describing the symptom, problem, condition, diagnosis, physician recommended return, or other factor as reason for encounter): wellness and Annual Exam and multiple comorbids including diast CHF, statin myophaty, hypothyrodism, vit d def, hld, ckd, preDM, htn, gout       HPI:  Suzanne Hansen is a 65 y.o. female here for wellness .  Her  Ht now down to 5'3" from previous 5'5" prior to her 3 back surguries.  Currently doing well per Dr Joya Salm per pt, no need to f/u soon.  Has no cane, or walker today, last used 1 yr ago.  Stil has some chronic stiffness worse in the AM, sees Dr Smith/sport med with cortisone to the knees a few times per yr.  May need f/u MRI to left knee per pt, but not yet, and plans to f/u with Dr Ninfa Linden eventually.  Has hx of gout, has done better with 200 mg allopurinol, so asking for new rx for this, also using tramadol, muscle relaxer, gabapentin and pennsiad prn.  Also with statin myopathy and leg cramps debilitating, resolved off the statin, does not want to try again.     Wt Readings from Last 3 Encounters:  10/15/20 240 lb (108.9 kg)  10/06/20 250 lb (113.4 kg)  08/21/20 247 lb (112 kg)   BP Readings from Last 3 Encounters:  10/15/20 124/72  10/06/20 114/72  08/21/20 128/74      Past Medical History:  Diagnosis Date  . ALLERGIC RHINITIS 05/09/2008  . Allergy   . ANXIETY 05/23/2007  . ARM PAIN, LEFT 10/23/2009  . Arthritis   . Asthma   . ASTHMA 05/23/2007  . Back pain   . Cardiomyopathy ([place]) 09/08/2015  . Chest pain   . CHF (congestive heart failure) ([place])   . Chronic diastolic heart failure ([place]) 01/22/2020  . Constipation   . CONTACT DERMATITIS 06/09/2009  . Diabetes mellitus without complication ([place])    type II  . Dyspnea    allergies-dust, cig smoke, rag weeds  . ENDOMETRIOSIS  NOS 05/23/2007  . FACIAL PAIN 06/02/2010  . Food allergy   . FREQUENCY, URINARY 05/17/2010  . GERD (gastroesophageal reflux disease)   . GLUCOSE INTOLERANCE 08/28/2009  . HERPES SIMPLEX, UNCOMPLICATED [id number]  . Hyperlipidemia 08/28/2015  . HYPERTHYROIDISM 05/23/2007  . Hypoactive thyroid   . HYPOTHYROIDISM 05/09/2008  . Impaired glucose tolerance 01/28/2011  . INSOMNIA, HX OF 05/23/2007  . Joint pain   . LATERAL EPICONDYLITIS, RIGHT 03/10/2009  . LIPOMA 10/22/2010  . NECK PAIN 11/10/2010  . OBESITY 05/23/2007  . Osteopenia 09/28/2018  . Plantar fasciitis    Both feet  . Pre-diabetes   . SHOULDER PAIN, LEFT 05/17/2010  . SINUSITIS- ACUTE-NOS 11/03/2008  . SKIN LESION 11/10/2010  . Sleep apnea   . Stage III chronic kidney disease ([place])    Stage III  . Unspecified Chest Pain 07/25/2008  . UNSPECIFIED URTICARIA 06/04/2009  . URI 08/28/2009  . UTI 04/29/2008  . VITAMIN D DEFICIENCY 08/28/2009  . Wheezing 07/12/2010   Past Surgical History:  Procedure Laterality Date  . COLONOSCOPY    . ENDOMETRIAL ABLATION    . LAMINECTOMY  1996  . LAPAROTOMY     exploratory  . lower back surgery  07/06/2017   cyst  . POLYPECTOMY    . s/p endometrial ablation  12/06  . TUBAL LIGATION    . UTERINE FIBROID SURGERY    . WISDOM TOOTH EXTRACTION      reports that she quit smoking about 32 years ago. Her smoking use included cigarettes. She has a 6.00 pack-year smoking history. She has never used smokeless tobacco. She reports that she does not drink alcohol and does not use drugs. family history includes COPD in her father; Cancer in her maternal grandmother; Depression in her mother; Diabetes in her cousin and sister; Heart disease in her cousin and mother; Hypertension in her cousin and father; Kidney disease in her sister. Allergies  Allergen Reactions  . Shellfish Allergy Anaphylaxis  . Clarithromycin Nausea And Vomiting  . Latex Itching and Other (See Comments)    ITCHING AND COLD SORES AROUND MOUTH  WHEN LATEX GLOVES USED BY THE DENTIST/HYGENTIST  . Metronidazole Nausea And Vomiting  . Zostavax [Zoster Vaccine Live]     Arm swelled  . Adhesive [Tape] Dermatitis    Plastic tape   . Lisinopril Cough  . Other Dermatitis    Plastic tape   . Peanut Oil     Pt says it's ok   Current Outpatient Medications on File Prior to Visit  Medication Sig Dispense Refill  . acetaminophen (TYLENOL) 650 MG CR tablet Take by mouth.    [name] aspirin 81 MG EC tablet Take 81 mg by mouth daily.    [name] buPROPion (WELLBUTRIN XL) 150 MG 24 hr tablet Take 150 mg by mouth daily.    [name] buPROPion (WELLBUTRIN XL) 300 MG 24 hr tablet Take 300 mg by mouth daily.    . Cholecalciferol 25 MCG (1000 UT) tablet Take by mouth.    . cyclobenzaprine (FLEXERIL) 10 MG tablet Take 10 mg by mouth at bedtime.    [name] estradiol (VIVELLE-DOT) 0.05 MG/24HR patch 1 patch 2 (two) times a week.    . fexofenadine (ALLEGRA) 180 MG tablet Take 180 mg by mouth daily.    . fluconazole (DIFLUCAN) 150 MG tablet TAKE 1 TABLET BY MOUTH EVERY 3 DAYS AS NEEDED 2 tablet 1  . fluticasone (FLONASE) 50 MCG/ACT nasal spray Place 2 sprays into both nostrils daily. (Patient taking differently: Place 2 sprays into both nostrils daily as needed for allergies.) 16 g 6  . gabapentin (NEURONTIN) 100 MG capsule Take 100 mg by mouth 2 (two) times daily.    [name] gabapentin (NEURONTIN) 300 MG capsule Take 300 mg by mouth 3 (three) times daily.    [name] guaiFENesin (MUCINEX) 600 MG 12 hr tablet Take 2 tablets (1,200 mg total) by mouth 2 (two) times daily as needed for to loosen phlegm. 60 tablet 1  . KLOR-CON M20 20 MEQ tablet Take 1 tablet (20 mEq total) by mouth daily. OK TO TAKE AN EXTRA ON DAYS YOU TAKE EXTRA TORSEMIDE 70 tablet 5  . levothyroxine (SYNTHROID) 112 MCG tablet TAKE 1 TABLET BY MOUTH DAILY BEFORE BREAKFAST 30 tablet 0  . losartan (COZAAR) 25 MG tablet Take 0.5 tablets (12.5 mg total) by mouth daily. 45 tablet 2  . Menthol-Methyl Salicylate (MUSCLE RUB) 10-15 %  CREA Apply 1 application topically as needed for muscle pain.    . Multiple Vitamin (MULTIVITAMIN) capsule Take 1 capsule by mouth daily.    [name] omeprazole (PRILOSEC) 20 MG capsule Take 20 mg by mouth daily as needed (acid reflux).     [name] PENNSAID 2 % SOLN APPLY 2 PUMPS TOPICALLY TO THE AFFECTED AREA(S) TWICE DAILY AS DIRECTED 112 g 2  .  predniSONE (DELTASONE) 20 MG tablet Take 1 tablet (20 mg total) by mouth daily with breakfast. 5 tablet 0  . progesterone (PROMETRIUM) 100 MG capsule Take 100 mg by mouth daily.    [name] torsemide (DEMADEX) 20 MG tablet TAKE 2 TABLETS IN THE MORNING AND 1 IN THE EVENING. OK TO TAKE AN EXTRA DAILY AS NEEDED FOR WEIGHT GAIN 60 tablet 5  . traMADol (ULTRAM) 50 MG tablet Take 1 tablet (50 mg total) by mouth every 6 (six) hours as needed. (Patient taking differently: Take 50 mg by mouth every 6 (six) hours as needed (pain).) 30 tablet 0  . valACYclovir (VALTREX) 1000 MG tablet Take 500 mg by mouth daily as needed (cold sores).   0   No current facility-administered medications on file prior to visit.        ROS:  All others reviewed and negative.  Objective        PE:  BP 124/72 (BP Location: Left Arm, Patient Position: Sitting, Cuff Size: Large)   Pulse 63   Temp 98.3 F (36.8 C) (Oral)   Ht 5' 4.5" (1.638 m)   Wt 240 lb (108.9 kg)   SpO2 96%   BMI 40.56 kg/m                 Constitutional: Pt appears in NAD               HENT: Head: NCAT.                Right Ear: External ear normal.                 Left Ear: External ear normal.                Eyes: . Pupils are equal, round, and reactive to light. Conjunctivae and EOM are normal               Nose: without d/c or deformity               Neck: Neck supple. Gross normal ROM               Cardiovascular: Normal rate and regular rhythm.                 Pulmonary/Chest: Effort normal and breath sounds without rales or wheezing.                Abd:  Soft, NT, ND, + BS, no organomegaly                Neurological: Pt is alert. At baseline orientation, motor grossly intact               Skin: Skin is warm. No rashes, no other new lesions, LE edema - none               Psychiatric: Pt behavior is normal without agitation   Assessment/Plan:  Aliscia Clayton is a 65 y.o. Black or African American [2] female with  has a past medical history of ALLERGIC RHINITIS (05/09/2008), Allergy, ANXIETY (05/23/2007), ARM PAIN, LEFT (10/23/2009), Arthritis, Asthma, ASTHMA (05/23/2007), Back pain, Cardiomyopathy (HCC) (09/08/2015), Chest pain, CHF (congestive heart failure) (HCC), Chronic diastolic heart failure (HCC) (3/32/9518), Constipation, CONTACT DERMATITIS (06/09/2009), Diabetes mellitus without complication (HCC), Dyspnea, ENDOMETRIOSIS NOS (05/23/2007), FACIAL PAIN (06/02/2010), Food allergy, FREQUENCY, URINARY (05/17/2010), GERD (gastroesophageal reflux disease), GLUCOSE INTOLERANCE (08/28/2009), HERPES SIMPLEX, UNCOMPLICATED (05/23/2007), Hyperlipidemia (08/28/2015), HYPERTHYROIDISM (05/23/2007), Hypoactive thyroid, HYPOTHYROIDISM (05/09/2008), Impaired glucose tolerance (01/28/2011),  INSOMNIA, HX OF (05/23/2007), Joint pain, LATERAL EPICONDYLITIS, RIGHT (03/10/2009), LIPOMA (10/22/2010), NECK PAIN (11/10/2010), OBESITY (05/23/2007), Osteopenia (09/28/2018), Plantar fasciitis, Pre-diabetes, SHOULDER PAIN, LEFT (05/17/2010), SINUSITIS- ACUTE-NOS (11/03/2008), SKIN LESION (11/10/2010), Sleep apnea, Stage III chronic kidney disease ([place]), Unspecified Chest Pain (07/25/2008), UNSPECIFIED URTICARIA (06/04/2009), URI (08/28/2009), UTI (04/29/2008), VITAMIN D DEFICIENCY (08/28/2009), and Wheezing (07/12/2010).  Assessment Plan  See notes Labs reviewed for each problem: Lab Results  Component Value Date   WBC 6.8 10/06/2020   HGB 13.4 10/06/2020   HCT 41.0 10/06/2020   PLT 287.0 10/06/2020   GLUCOSE 130 (H) 10/06/2020   CHOL 204 (H) 10/06/2020   TRIG 125.0 10/06/2020   HDL 72.50 10/06/2020   LDLCALC 107 (H) 10/06/2020   ALT 18  10/06/2020   AST 19 10/06/2020   NA 140 10/06/2020   K 3.9 10/06/2020   CL 102 10/06/2020   CREATININE 1.29 (H) 10/06/2020   BUN 17 10/06/2020   CO2 30 10/06/2020   TSH 27.25 (H) 10/06/2020   INR 1.0 03/21/2017   HGBA1C 5.5 10/06/2020   MICROALBUR <0.7 10/02/2019    Micro: none  Cardiac tracings I have personally interpreted today:  none  Pertinent Radiological findings (summarize): none   I spent total 36 minutes in addition to wellness in caring for the patient for this visit:  1) by communicating with the patient during the visit  2) by review of pertinent vital sign data, physical examination and labs as documented in the assessment and plan  3) by review of pertinent imaging - none today  4) by review of pertinent procedures - none today  5) by obtaining and reviewing separately obtained information from family/caretaker and Care Everywhere - none today  6) by ordering medications  7) by ordering tests  8) by documenting all of this clinical information in the EHR including the management of each problem noted today in assessment and plan  There are no preventive care reminders to display for this patient.  There are no preventive care reminders to display for this patient.  Lab Results  Component Value Date   TSH 27.25 (H) 10/06/2020   Lab Results  Component Value Date   WBC 6.8 10/06/2020   HGB 13.4 10/06/2020   HCT 41.0 10/06/2020   MCV 91.4 10/06/2020   PLT 287.0 10/06/2020   Lab Results  Component Value Date   NA 140 10/06/2020   K 3.9 10/06/2020   CO2 30 10/06/2020   GLUCOSE 130 (H) 10/06/2020   BUN 17 10/06/2020   CREATININE 1.29 (H) 10/06/2020   BILITOT 0.4 10/06/2020   ALKPHOS 96 10/06/2020   AST 19 10/06/2020   ALT 18 10/06/2020   PROT 6.9 10/06/2020   ALBUMIN 4.2 10/06/2020   CALCIUM 9.8 10/06/2020   CALCIUM 10.0 10/06/2020   ANIONGAP 9 09/03/2019   GFR 44.17 (L) 10/06/2020   Lab Results  Component Value Date   CHOL 204 (H)  10/06/2020   Lab Results  Component Value Date   HDL 72.50 10/06/2020   Lab Results  Component Value Date   LDLCALC 107 (H) 10/06/2020   Lab Results  Component Value Date   TRIG 125.0 10/06/2020   Lab Results  Component Value Date   CHOLHDL 3 10/06/2020   Lab Results  Component Value Date   HGBA1C 5.5 10/06/2020      Assessment & Plan:   Problem List Items Addressed This Visit      High   Statin myopathy    Unable to  tolerate statin after trial x 3      Chronic diastolic heart failure (HCC)    Stable volume, cont diuretic        Medium   Vitamin D deficiency    Start oral replacement 200 u qd      Prediabetes    Lab Results  Component Value Date   HGBA1C 5.5 10/06/2020   Stable, cont diet and wt control      Hypothyroidism    Lab Results  Component Value Date   TSH 27.25 (H) 10/06/2020  pt states recent med non compliance, states will take daily from now on, f/iu lab next viist      Hyperlipidemia    Lab Results  Component Value Date   LDLCALC 107 (H) 10/06/2020  unable to tolerate statin, for low chol diet, start zetia 10 qd      Gout    Chronic stable no arthritis, con t allopurinol 200 qd      Essential hypertension    BP Readings from Last 3 Encounters:  10/15/20 124/72  10/06/20 114/72  08/21/20 128/74   Stable, cont losartan 25      CKD (chronic kidney disease) stage 3, GFR 30-59 ml/min (HCC)    Lab Results  Component Value Date   CREATININE 1.29 (H) 10/06/2020  stable, declines further renal referral for now        Unprioritized   Encounter for well adult exam with abnormal findings - Primary    Overall doing well, age appropriate education and counseling updated, referrals for preventative services and immunizations addressed, dietary and smoking counseling addressed, most recent labs reviewed.  I have personally reviewed and have noted:  1) the patient's medical and social history 2) The pt's use of alcohol, tobacco,  and illicit drugs 3) The patient's current medications and supplements 4) Functional ability including ADL's, fall risk, home safety risk, hearing and visual impairment 5) Diet and physical activities 6) Evidence for depression or mood disorder 7) The patient's height, weight, and BMI have been recorded in the chart  I have made referrals, and provided counseling and education based on review of the above          Meds ordered this encounter  Medications  . allopurinol (ZYLOPRIM) 100 MG tablet    Sig: Take 2 tablets (200 mg total) by mouth daily. Take 200 mg by mouth daily.    Dispense:  180 tablet    Refill:  3  . albuterol (VENTOLIN HFA) 108 (90 Base) MCG/ACT inhaler    Sig: Inhale 2 puffs into the lungs every 4 (four) hours as needed.    Dispense:  8 g    Refill:  11  . budesonide-formoterol (SYMBICORT) 160-4.5 MCG/ACT inhaler    Sig: Inhale 2 puffs into the lungs 2 (two) times daily.    Dispense:  10.2 g    Refill:  11    Follow-up: No follow-ups on file.    Cathlean Cower, MD 10/15/2020 2:16 PM [facility]

## 2020-10-15 NOTE — Assessment & Plan Note (Addendum)
Start oral replacement 200 u qd

## 2020-10-15 NOTE — Assessment & Plan Note (Signed)
Unable to tolerate statin after trial x 3

## 2020-10-15 NOTE — Assessment & Plan Note (Signed)

## 2020-10-15 NOTE — Assessment & Plan Note (Signed)
Lab Results  Component Value Date   LDLCALC 107 (H) 10/06/2020  unable to tolerate statin, for low chol diet, start zetia 10 qd

## 2020-10-15 NOTE — Assessment & Plan Note (Signed)
Lab Results  Component Value Date   HGBA1C 5.5 10/06/2020   Stable, cont diet and wt control

## 2020-10-15 NOTE — Telephone Encounter (Signed)
   *  STAT* If patient is at the pharmacy, call can be transferred to refill team.   1. Which medications need to be refilled? (please list name of each medication and dose if known)   KLOR-CON M20 20 MEQ tablet    torsemide (DEMADEX) 20 MG tablet   2. Which pharmacy/location (including street and city if local pharmacy) is medication to be sent to? [id number] - [place], [place] - [address]  3. Do they need a 30 day or 90 day supply? 90 days

## 2020-10-15 NOTE — Telephone Encounter (Signed)
Routed to primary nurse for letter.   OV Dr. Duke Salvia 7/13- # Obesity:  Encouraged to continue exercise program.

## 2020-10-15 NOTE — Assessment & Plan Note (Signed)
Lab Results  Component Value Date   CREATININE 1.29 (H) 10/06/2020  stable, declines further renal referral for now

## 2020-10-15 NOTE — Patient Instructions (Addendum)
Please take OTC Vitamin D3 at 2000 units per day, indefinitely.  Ok to continue the allopurinol at 200 mg per day  Please take all new medication as prescribed - the generic zetia 10 mg per day  Please continue all other medications as before, and refills have been done if requested.  Please have the pharmacy call with any other refills you may need.  Please continue your efforts at being more active, low cholesterol diet, and weight control.  You are otherwise up to date with prevention measures today.  Please keep your appointments with your specialists as you may have planned  Please make an Appointment to return in 6 months, or sooner if needed, also with Lab Appointment for testing done 3-5 days before at the FIRST FLOOR Lab (so this is for TWO appointments - please see the scheduling desk as you leave)  Due to the ongoing Covid 19 pandemic, our lab now requires an appointment for any labs done at our office.  If you need labs done and do not have an appointment, please call our office ahead of time to schedule before presenting to the lab for your testing.   [name]

## 2020-10-15 NOTE — Assessment & Plan Note (Signed)
Lab Results  Component Value Date   TSH 27.25 (H) 10/06/2020  pt states recent med non compliance, states will take daily from now on, f/iu lab next viist

## 2020-10-15 NOTE — Assessment & Plan Note (Signed)
Chronic stable no arthritis, con t allopurinol 200 qd

## 2020-10-15 NOTE — Assessment & Plan Note (Signed)
BP Readings from Last 3 Encounters:  10/15/20 124/72  10/06/20 114/72  08/21/20 128/74   Stable, cont losartan 25

## 2020-10-15 NOTE — Assessment & Plan Note (Signed)
Stable volume, cont diuretic

## 2020-10-16 ENCOUNTER — Other Ambulatory Visit: Payer: Self-pay | Admitting: Internal Medicine

## 2020-10-16 ENCOUNTER — Telehealth: Payer: Self-pay | Admitting: Internal Medicine

## 2020-10-16 NOTE — Telephone Encounter (Signed)
Patient was seen yesterday by Dr. Jenny Reichmann and he stated he was going to send her in Zetia for her cholesterol but when she went to the pharmacy they had not received anything.  [id number] [place], [place] Phone:  [phone]  Fax:  [phone]

## 2020-10-16 NOTE — Telephone Encounter (Signed)
Please refill as per office routine med refill policy (all routine meds refilled for 3 mo or monthly per pt preference up to one year from last visit, then month to month grace period for 3 mo, then further med refills will have to be denied)  

## 2020-10-20 ENCOUNTER — Ambulatory Visit: Payer: BC Managed Care – PPO | Admitting: Cardiovascular Disease

## 2020-10-20 ENCOUNTER — Encounter: Payer: Self-pay | Admitting: Cardiovascular Disease

## 2020-10-20 ENCOUNTER — Other Ambulatory Visit: Payer: Self-pay

## 2020-10-20 ENCOUNTER — Telehealth: Payer: Self-pay | Admitting: Neurology

## 2020-10-20 VITALS — BP 118/70 | HR 83 | Ht 63.0 in | Wt 241.0 lb

## 2020-10-20 DIAGNOSIS — I10 Essential (primary) hypertension: Secondary | ICD-10-CM | POA: Diagnosis not present

## 2020-10-20 DIAGNOSIS — I5032 Chronic diastolic (congestive) heart failure: Secondary | ICD-10-CM

## 2020-10-20 DIAGNOSIS — Z6841 Body Mass Index (BMI) 40.0 and over, adult: Secondary | ICD-10-CM

## 2020-10-20 DIAGNOSIS — I480 Paroxysmal atrial fibrillation: Secondary | ICD-10-CM

## 2020-10-20 DIAGNOSIS — E0590 Thyrotoxicosis, unspecified without thyrotoxic crisis or storm: Secondary | ICD-10-CM

## 2020-10-20 DIAGNOSIS — E785 Hyperlipidemia, unspecified: Secondary | ICD-10-CM

## 2020-10-20 DIAGNOSIS — G4733 Obstructive sleep apnea (adult) (pediatric): Secondary | ICD-10-CM

## 2020-10-20 DIAGNOSIS — Z9989 Dependence on other enabling machines and devices: Secondary | ICD-10-CM

## 2020-10-20 DIAGNOSIS — N183 Chronic kidney disease, stage 3 unspecified: Secondary | ICD-10-CM

## 2020-10-20 DIAGNOSIS — G4710 Hypersomnia, unspecified: Secondary | ICD-10-CM

## 2020-10-20 MED ORDER — EZETIMIBE 10 MG PO TABS: 10.0000 mg | ORAL_TABLET | Freq: Every day | ORAL | 3 refills | Status: DC

## 2020-10-20 MED ORDER — TORSEMIDE 20 MG PO TABS: ORAL_TABLET | ORAL | 5 refills | Status: DC

## 2020-10-20 MED ORDER — KLOR-CON M20 20 MEQ PO TBCR: 20.0000 meq | EXTENDED_RELEASE_TABLET | Freq: Every day | ORAL | 5 refills | Status: DC

## 2020-10-20 NOTE — Telephone Encounter (Signed)
Ok, [facility] 

## 2020-10-20 NOTE — Telephone Encounter (Signed)
Please advise.   I didn't see on her med list.   Thanks. Dm/cma

## 2020-10-20 NOTE — Patient Instructions (Signed)
Medication Instructions:  Your physician recommends that you continue on your current medications as directed. Please refer to the Current Medication list given to you today.  *If you need a refill on your cardiac medications before your next appointment, please call your pharmacy*   Lab Work: NONE   Testing/Procedures: NONE    Follow-Up: At [facility], you and your health needs are our priority.  As part of our continuing mission to provide you with exceptional heart care, we have created designated Provider Care Teams.  These Care Teams include your primary Cardiologist (physician) and Advanced Practice Providers (APPs -  Physician Assistants and Nurse Practitioners) who all work together to provide you with the care you need, when you need it.  We recommend signing up for the patient portal called "MyChart".  Sign up information is provided on this After Visit Summary.  MyChart is used to connect with patients for Virtual Visits (Telemedicine).  Patients are able to view lab/test results, encounter notes, upcoming appointments, etc.  Non-urgent messages can be sent to your provider as well.   To learn more about what you can do with MyChart, go to [email or web address].    Your next appointment:   6 month(s)  The format for your next appointment:   In Person  Provider:   You may see DR Brashear  or one of the following Advanced Practice Providers on your designated Care Team:    Luke Kilroy, PA-C  Callie Goodrich, PA-C  Jesse Cleaver, FNP    

## 2020-10-20 NOTE — Telephone Encounter (Signed)
Patient notified VIA phone. Dm/cma  

## 2020-10-20 NOTE — Telephone Encounter (Signed)
Pt. states the CPAP machine is blowing too much air. She states it is like she can't tolerate it & she starts coughing. Please advise.

## 2020-10-20 NOTE — Progress Notes (Signed)
Cardiology Office Note   Date:  10/20/2020   ID:  Suzanne Hansen, DOB [birth year], MRN [id number]  PCP:  Suzanne Levins, MD  Cardiologist:   Suzanne Si, MD   No chief complaint on file.    History of Present Illness: Suzanne Hansen is a 65 y.o. female paroxysmal atrial fibrillation, chronic systolic and diastolic heart failure (LVEF recovered), CKD III, OSA, diabetes, prior tobacco use, and hypothyroidism who is being seen today to establish care.  She was first seen in 2016 when she is diagnosed with acute systolic and diastolic heart failure.  Prior to that she had a stress Myoview 09/2013 that revealed LVEF 48% and no ischemia.  She was followed by her primary care physician and switch from hydrochlorothiazide to Lasix.  She had no symptoms at the time she first saw Dr. [name] in 2016.  She was started on carvedilol and lisinopril.    Lisinopril was subsequently converted to losartan due to cough.  Her symptoms were felt to be nonischemic.  She notes that she was under a lot of stress at the time.  She subsequently transferred her care to [facility] with Dr. Dot Hansen.  He underwent CPX testing in 2018 and was found to have moderate cardiac limitation and deconditioning.  Echo 01/2017 revealed LVEF 50-55%.  Her most recent echo 05/2018 revealed LVEF 55 to 60% with grade 1 diastolic dysfunction.  She reportedly has a history of atrial fibrillation that occurred in the setting of her initial heart failure symptoms.  She has not had any recurrence and has never been on anticoagulation.  She was symptomatic in atrial fibrillation.  [name] had a spinal fusion in December 2020.  Her back pain has been better but still troubles her at times.  It makes it difficult for her to exercise.  After her surgery she had some issues with bladder incontinence and lowering the dose of her diuretic seems to help this as well.  She last saw Dr. Jonny Hansen on 10/15/2020 and was started on Zetia.  She hasn't picked it  up yet.  She wasn't tolerating statins due to muscle pain.  She didn't tolerate it even every other day.  She has been feeling well.  She wants to lose weight.  She will be going to [place] with her daughter.  She just got injections in her knee. She has no chest pain and her breathing has been stable.  Her BP has been <130/80 at home.  She has mild edema at times but it improves by the next day. She is having trouble with her CPAP machine and sometimes coughs when she uses it.  She has follow-up scheduled with her neurologist for this.  Past Medical History:  Diagnosis Date  . ALLERGIC RHINITIS 05/09/2008  . Allergy   . ANXIETY 05/23/2007  . ARM PAIN, LEFT 10/23/2009  . Arthritis   . Asthma   . ASTHMA 05/23/2007  . Back pain   . Cardiomyopathy (HCC) 09/08/2015  . Chest pain   . CHF (congestive heart failure) (HCC)   . Chronic diastolic heart failure (HCC) 01/22/2020  . Constipation   . CONTACT DERMATITIS 06/09/2009  . Diabetes mellitus without complication (HCC)    type II  . Dyspnea    allergies-dust, cig smoke, rag weeds  . ENDOMETRIOSIS NOS 05/23/2007  . FACIAL PAIN 06/02/2010  . Food allergy   . FREQUENCY, URINARY 05/17/2010  . GERD (gastroesophageal reflux disease)   . GLUCOSE INTOLERANCE 08/28/2009  . HERPES  SIMPLEX, UNCOMPLICATED [id number]  . Hyperlipidemia 08/28/2015  . HYPERTHYROIDISM 05/23/2007  . Hypoactive thyroid   . HYPOTHYROIDISM 05/09/2008  . Impaired glucose tolerance 01/28/2011  . INSOMNIA, HX OF 05/23/2007  . Joint pain   . LATERAL EPICONDYLITIS, RIGHT 03/10/2009  . LIPOMA 10/22/2010  . NECK PAIN 11/10/2010  . OBESITY 05/23/2007  . Osteopenia 09/28/2018  . Plantar fasciitis    Both feet  . Pre-diabetes   . SHOULDER PAIN, LEFT 05/17/2010  . SINUSITIS- ACUTE-NOS 11/03/2008  . SKIN LESION 11/10/2010  . Sleep apnea   . Stage III chronic kidney disease ([place])    Stage III  . Unspecified Chest Pain 07/25/2008  . UNSPECIFIED URTICARIA 06/04/2009  . URI 08/28/2009  . UTI  04/29/2008  . VITAMIN D DEFICIENCY 08/28/2009  . Wheezing 07/12/2010    Past Surgical History:  Procedure Laterality Date  . COLONOSCOPY    . ENDOMETRIAL ABLATION    . LAMINECTOMY  1996  . LAPAROTOMY     exploratory  . lower back surgery  07/06/2017   cyst  . POLYPECTOMY    . s/p endometrial ablation  12/06  . TUBAL LIGATION    . UTERINE FIBROID SURGERY    . WISDOM TOOTH EXTRACTION      Current Outpatient Medications  Medication Sig Dispense Refill  . acetaminophen (TYLENOL) 650 MG CR tablet Take by mouth.    [name] albuterol (VENTOLIN HFA) 108 (90 Base) MCG/ACT inhaler Inhale 2 puffs into the lungs every 4 (four) hours as needed. 8 g 11  . allopurinol (ZYLOPRIM) 100 MG tablet Take 2 tablets (200 mg total) by mouth daily. Take 200 mg by mouth daily. 180 tablet 3  . aspirin 81 MG EC tablet Take 81 mg by mouth daily.    . budesonide-formoterol (SYMBICORT) 160-4.5 MCG/ACT inhaler Inhale 2 puffs into the lungs 2 (two) times daily. 10.2 g 11  . buPROPion (WELLBUTRIN XL) 300 MG 24 hr tablet Take 300 mg by mouth daily.    . Cholecalciferol 25 MCG (1000 UT) tablet Take by mouth.    . cyclobenzaprine (FLEXERIL) 10 MG tablet Take 10 mg by mouth at bedtime.    [name] estradiol (VIVELLE-Suzanne) 0.05 MG/24HR patch 1 patch 2 (two) times a week.    . ezetimibe (ZETIA) 10 MG tablet Take 1 tablet (10 mg total) by mouth daily. 90 tablet 3  . fexofenadine (ALLEGRA) 180 MG tablet Take 180 mg by mouth daily.    . fluconazole (DIFLUCAN) 150 MG tablet TAKE 1 TABLET BY MOUTH EVERY 3 DAYS AS NEEDED 2 tablet 1  . fluticasone (FLONASE) 50 MCG/ACT nasal spray Place 2 sprays into both nostrils daily. (Patient taking differently: Place 2 sprays into both nostrils daily as needed for allergies.) 16 g 6  . gabapentin (NEURONTIN) 100 MG capsule Take 100 mg by mouth 2 (two) times daily as needed.    . gabapentin (NEURONTIN) 300 MG capsule Take 300 mg by mouth at bedtime as needed.    [name] guaiFENesin (MUCINEX) 600 MG 12 hr tablet  Take 2 tablets (1,200 mg total) by mouth 2 (two) times daily as needed for to loosen phlegm. 60 tablet 1  . KLOR-CON M20 20 MEQ tablet Take 1 tablet (20 mEq total) by mouth daily. OK TO TAKE AN EXTRA ON DAYS YOU TAKE EXTRA TORSEMIDE 70 tablet 5  . levothyroxine (SYNTHROID) 112 MCG tablet TAKE 1 TABLET BY MOUTH EVERY DAY BEFORE BREAKFAST 30 tablet 0  . losartan (COZAAR) 25 MG tablet Take 0.5 tablets (12.5  mg total) by mouth daily. 45 tablet 2  . Menthol-Methyl Salicylate (MUSCLE RUB) 10-15 % CREA Apply 1 application topically as needed for muscle pain.    . Multiple Vitamin (MULTIVITAMIN) capsule Take 1 capsule by mouth daily.    [name] omeprazole (PRILOSEC) 20 MG capsule Take 20 mg by mouth daily as needed (acid reflux).     [name] PENNSAID 2 % SOLN APPLY 2 PUMPS TOPICALLY TO THE AFFECTED AREA(S) TWICE DAILY AS DIRECTED 112 g 2  . progesterone (PROMETRIUM) 100 MG capsule Take 100 mg by mouth daily.    [name] torsemide (DEMADEX) 20 MG tablet TAKE 2 TABLETS IN THE MORNING AND 1 IN THE EVENING. OK TO TAKE AN EXTRA DAILY AS NEEDED FOR WEIGHT GAIN 60 tablet 5  . traMADol (ULTRAM) 50 MG tablet Take 1 tablet (50 mg total) by mouth every 6 (six) hours as needed. (Patient taking differently: Take 50 mg by mouth every 6 (six) hours as needed (pain).) 30 tablet 0  . valACYclovir (VALTREX) 1000 MG tablet Take 500 mg by mouth daily as needed (cold sores).   0   No current facility-administered medications for this visit.    Allergies:   Shellfish allergy, Clarithromycin, Latex, Metronidazole, Zostavax [zoster vaccine live], Adhesive [tape], Lisinopril, Other, and Peanut oil    Social History:  The patient  reports that she quit smoking about 32 years ago. Her smoking use included cigarettes. She has a 6.00 pack-year smoking history. She has never used smokeless tobacco. She reports that she does not drink alcohol and does not use drugs.   Family History:  The patient's family history includes COPD in her father; Cancer in  her maternal grandmother; Depression in her mother; Diabetes in her cousin and sister; Heart disease in her cousin and mother; Hypertension in her cousin and father; Kidney disease in her sister.    ROS:  Please see the history of present illness.   Otherwise, review of systems are positive for none.   All other systems are reviewed and negative.    PHYSICAL EXAM: VS:  BP 118/70   Pulse 83   Ht 5\' 3"  (1.6 m)   Wt 241 lb (109.3 kg)   BMI 42.69 kg/m  , BMI Body mass index is 42.69 kg/m. GENERAL:  Well appearing HEENT:  Pupils equal round and reactive, fundi not visualized, oral mucosa unremarkable NECK:  No jugular venous distention, waveform within normal limits, carotid upstroke brisk and symmetric, no bruits, no thyromegaly LYMPHATICS:  No cervical adenopathy LUNGS:  Clear to auscultation bilaterally HEART:  RRR.  PMI not displaced or sustained,S1 and S2 within normal limits, no S3, no S4, no clicks, no rubs, no murmurs ABD:  Flat, positive bowel sounds normal in frequency in pitch, no bruits, no rebound, no guarding, no midline pulsatile mass, no hepatomegaly, no splenomegaly EXT:  2 plus pulses throughout, trace edema, no cyanosis no clubbing SKIN:  No rashes no nodules NEURO:  Cranial nerves II through XII grossly intact, motor grossly intact throughout PSYCH:  Cognitively intact, oriented to person place and time  EKG:  EKG is not ordered today. The ekg ordered 01/22/20 demonstrates sinus rhythm.  Rate 74 bpm.  Low voltage.   04/21/20: Sinus rhythm.  Rate 79 bpm.  Prior inferior infarct.   Recent Labs: 10/06/2020: ALT 18; BUN 17; Creatinine, Ser 1.29; Hemoglobin 13.4; Platelets 287.0; Potassium 3.9; Sodium 140; TSH 27.25    Lipid Panel    Component Value Date/Time   CHOL 204 (H) 10/06/2020  1057   CHOL 148 07/18/2019 0900   TRIG 125.0 10/06/2020 1057   HDL 72.50 10/06/2020 1057   HDL 83 07/18/2019 0900   CHOLHDL 3 10/06/2020 1057   VLDL 25.0 10/06/2020 1057   LDLCALC  107 (H) 10/06/2020 1057   LDLCALC 50 07/18/2019 0900      Wt Readings from Last 3 Encounters:  10/20/20 241 lb (109.3 kg)  10/15/20 240 lb (108.9 kg)  10/06/20 250 lb (113.4 kg)      ASSESSMENT AND PLAN:  # Chronic diastolic heart failure: # Essential hypertension:  Previously had systolic dysfunction that has subsequently improved.  She has nonischemic cardiomyopathy.  She never had a cath.  Stress testing has been negative for ischemia.  Volume status stable and renal function stable.  Continue torsemide and losartan.  BP has been stable.  She was encouraged to start her exercise program as planned.  # CKD II: Renal function has improved.  Creatinine was 1.29 last month.  Continue to avoid NSAIDs.  Continue ARB.  # Hyperlipidemia:   She tried 3 different statins and has not tolerated them.  She last saw Dr. Jenny Reichmann on 10/15/2020 and was started on Zetia.  She will pick it up today.  Repeat lipids/CMP in 3 months.  # OSA: Continue using CPAP.  Follow-up with neurology as planned.  # Obesity:  Encouraged to continue exercise program.     Current medicines are reviewed at length with the patient today.  The patient does not have concerns regarding medicines.  The following changes have been made:  no change  Labs/ tests ordered today include:  No orders of the defined types were placed in this encounter.    Disposition:   FU with Anandi Abramo C. Oval Linsey, MD, [facility] in 6 months.    Signed, Addysin Porco C. Oval Linsey, MD, [facility]  10/20/2020 5:40 PM    [facility]

## 2020-10-21 NOTE — Addendum Note (Signed)
Addended by: Larey Seat on: 10/21/2020 04:40 PM   Modules accepted: Orders

## 2020-10-21 NOTE — Telephone Encounter (Signed)
Please share with Suzanne Hansen that she has good numeric results, the CPAP data download shows a significant reduction in AHI, her AH was 3.0/h.  The 95% pressure for CPAP is 9.3 cm water, and I feel that we can reduce her upper pressure window from 16 cm water to 13 cm water.  She has a 3 cm water pressure expiratory pressure relief, which should make it easier to exhale.   I am unable to see any indication of being treated with too much pressure- no central apneas have arisen and she did not report gulping air.   I will order a reduction in pressure by 3 cm water. CD

## 2020-10-21 NOTE — Telephone Encounter (Signed)
I called patient.  Patient answers phone but cannot hear me.  I tried this twice.  I have sent the patient a MyChart message.  I have sent the pressure reduction order to [facility] via community message.

## 2020-10-21 NOTE — Telephone Encounter (Signed)
I reviewed patient's CPAP data.  She is set at 5 to 15 cwp.  Her AHI is 3.0. Leak is insignificant.  She has used her machine 20 of the past 30 days with 15 days greater than 4 hours.  I will send to Dr. Brett Fairy for further review.

## 2020-10-25 ENCOUNTER — Other Ambulatory Visit: Payer: Self-pay | Admitting: Family Medicine

## 2020-11-02 ENCOUNTER — Other Ambulatory Visit: Payer: Self-pay

## 2020-11-02 MED ORDER — ALLOPURINOL 100 MG PO TABS: 200.0000 mg | ORAL_TABLET | Freq: Every day | ORAL | 3 refills | Status: DC

## 2020-11-04 DIAGNOSIS — M5412 Radiculopathy, cervical region: Secondary | ICD-10-CM | POA: Diagnosis not present

## 2020-11-05 ENCOUNTER — Other Ambulatory Visit: Payer: Self-pay

## 2020-11-10 ENCOUNTER — Other Ambulatory Visit: Payer: Self-pay

## 2020-11-11 ENCOUNTER — Other Ambulatory Visit: Payer: Self-pay | Admitting: Internal Medicine

## 2020-11-11 NOTE — Telephone Encounter (Signed)
Please refill as per office routine med refill policy (all routine meds refilled for 3 mo or monthly per pt preference up to one year from last visit, then month to month grace period for 3 mo, then further med refills will have to be denied)  

## 2020-12-03 NOTE — Progress Notes (Signed)
[place] Phone: [phone] Subjective:   Suzanne Hansen, am serving as a scribe for Dr. Hulan Saas. This visit occurred during the SARS-CoV-2 public health emergency.  Safety protocols were in place, including screening questions prior to the visit, additional usage of staff PPE, and extensive cleaning of exam room while observing appropriate contact time as indicated for disinfecting solutions.   I'm seeing this patient by the request  of:  Biagio Borg, MD  CC: Bilateral knee pain follow-up  DTO:[id number]   10/06/2020 Repeat injection given again today.  Patient has had elevation of uric acid previously and would like to recheck.  Patient may have vertigo up on her allopurinol.  We have watched patient's chronic kidney disease though as well.  We will need to discuss with primary care provider.  Patient's knees have shown that patient does have mild to moderate arthritic changes.  Patient did have an MRI of the left knee though that did show fairly severe patellofemoral arthritic changes.  Patient wants to avoid any type of surgical intervention if possible.  Has not responded extremely well to the viscosupplementation but I do think that it still needs to be considered.  Patient also could potentially try PRP.  We did discuss that because of patient's fall when she did have an MRI we may want to consider repeating the MRI to further evaluate for any type of OCD in the left knee that could be contributing.  Patient will follow up with me again in 3 months.   Update 12/08/2020 Suzanne Hansen is a 65 y.o. female coming in with complaint of bilateral knee pain. Durolane approved until 04/13/2021. Patient does not feel that gel injections work as well as the steroid. L>R. Pain is becoming constant.  Patient wants to remain active.  Was attempting to lose weight.  Patient wants to continue to do so if possible.  Patient of  note found when she was working out regularly started having increasing pain.     Past Medical History:  Diagnosis Date  . ALLERGIC RHINITIS 05/09/2008  . Allergy   . ANXIETY 05/23/2007  . ARM PAIN, LEFT 10/23/2009  . Arthritis   . Asthma   . ASTHMA 05/23/2007  . Back pain   . Cardiomyopathy ([place]) 09/08/2015  . Chest pain   . CHF (congestive heart failure) ([place])   . Chronic diastolic heart failure ([place]) 01/22/2020  . Constipation   . CONTACT DERMATITIS 06/09/2009  . Diabetes mellitus without complication ([place])    type II  . Dyspnea    allergies-dust, cig smoke, rag weeds  . ENDOMETRIOSIS NOS 05/23/2007  . FACIAL PAIN 06/02/2010  . Food allergy   . FREQUENCY, URINARY 05/17/2010  . GERD (gastroesophageal reflux disease)   . GLUCOSE INTOLERANCE 08/28/2009  . HERPES SIMPLEX, UNCOMPLICATED 8/33/8250  . Hyperlipidemia 08/28/2015  . HYPERTHYROIDISM 05/23/2007  . Hypoactive thyroid   . HYPOTHYROIDISM 05/09/2008  . Impaired glucose tolerance 01/28/2011  . INSOMNIA, HX OF 05/23/2007  . Joint pain   . LATERAL EPICONDYLITIS, RIGHT 03/10/2009  . LIPOMA 10/22/2010  . NECK PAIN 11/10/2010  . OBESITY 05/23/2007  . Osteopenia 09/28/2018  . Plantar fasciitis    Both feet  . Pre-diabetes   . SHOULDER PAIN, LEFT 05/17/2010  . SINUSITIS- ACUTE-NOS 11/03/2008  . SKIN LESION 11/10/2010  . Sleep apnea   . Stage III chronic kidney disease ([place])    Stage III  . Unspecified Chest  Pain 07/25/2008  . UNSPECIFIED URTICARIA 06/04/2009  . URI 08/28/2009  . UTI 04/29/2008  . VITAMIN D DEFICIENCY 08/28/2009  . Wheezing 07/12/2010   Past Surgical History:  Procedure Laterality Date  . COLONOSCOPY    . ENDOMETRIAL ABLATION    . LAMINECTOMY  1996  . LAPAROTOMY     exploratory  . lower back surgery  07/06/2017   cyst  . POLYPECTOMY    . s/p endometrial ablation  12/06  . TUBAL LIGATION    . UTERINE FIBROID SURGERY    . WISDOM TOOTH EXTRACTION     Social History   Socioeconomic History  . Marital status:  Divorced    Spouse name: Not on file  . Number of children: 3  . Years of education: Not on file  . Highest education level: Not on file  Occupational History  . Occupation: SOCIAL WORKER    Employer: [employer]    Comment: [place]  Tobacco Use  . Smoking status: Former Smoker    Packs/day: 0.50    Years: 12.00    Pack years: 6.00    Types: Cigarettes    Quit date: 11/05/1987    Years since quitting: 33.1  . Smokeless tobacco: Never Used  Vaping Use  . Vaping Use: Never used  Substance and Sexual Activity  . Alcohol use: Hansen    Alcohol/week: 1.0 standard drink    Types: 1 Glasses of wine per week    Comment: WINE  . Drug use: Hansen  . Sexual activity: Not on file  Other Topics Concern  . Not on file  Social History Narrative  . Not on file   Social Determinants of Health   Financial Resource Strain: Not on file  Food Insecurity: Not on file  Transportation Needs: Not on file  Physical Activity: Not on file  Stress: Not on file  Social Connections: Not on file   Allergies  Allergen Reactions  . Shellfish Allergy Anaphylaxis  . Clarithromycin Nausea And Vomiting  . Latex Itching and Other (See Comments)    ITCHING AND COLD SORES AROUND MOUTH WHEN LATEX GLOVES USED BY THE DENTIST/HYGENTIST  . Metronidazole Nausea And Vomiting  . Zostavax [Zoster Vaccine Live]     Arm swelled  . Adhesive [Tape] Dermatitis    Plastic tape   . Lisinopril Cough  . Other Dermatitis    Plastic tape   . Peanut Oil     Pt says it's ok   Family History  Problem Relation Age of Onset  . Diabetes Cousin   . Hypertension Cousin   . Heart disease Cousin   . Heart disease Mother        Rheumatic heart disease  . Depression Mother   . COPD Father   . Hypertension Father   . Cancer Maternal Grandmother        Breast  . Diabetes Sister   . Kidney disease Sister     Current Outpatient Medications (Endocrine & Metabolic):  .  estradiol (VIVELLE-DOT) 0.05 MG/24HR  patch, 1 patch 2 (two) times a week. .  levothyroxine (SYNTHROID) 112 MCG tablet, TAKE 1 TABLET BY MOUTH EVERY DAY BEFORE BREAKFAST .  progesterone (PROMETRIUM) 100 MG capsule, Take 100 mg by mouth daily.  Current Outpatient Medications (Cardiovascular):  .  ezetimibe (ZETIA) 10 MG tablet, Take 1 tablet (10 mg total) by mouth daily. [name]  losartan (COZAAR) 25 MG tablet, Take 0.5 tablets (12.5 mg total) by mouth daily. [name]  torsemide (DEMADEX)  20 MG tablet, TAKE 2 TABLETS IN THE MORNING AND 1 IN THE EVENING. OK TO TAKE AN EXTRA DAILY AS NEEDED FOR WEIGHT GAIN  Current Outpatient Medications (Respiratory):  .  albuterol (VENTOLIN HFA) 108 (90 Base) MCG/ACT inhaler, Inhale 2 puffs into the lungs every 4 (four) hours as needed. .  budesonide-formoterol (SYMBICORT) 160-4.5 MCG/ACT inhaler, Inhale 2 puffs into the lungs 2 (two) times daily. .  fexofenadine (ALLEGRA) 180 MG tablet, Take 180 mg by mouth daily. .  fluticasone (FLONASE) 50 MCG/ACT nasal spray, Place 2 sprays into both nostrils daily. (Patient taking differently: Place 2 sprays into both nostrils daily as needed for allergies.) .  guaiFENesin (MUCINEX) 600 MG 12 hr tablet, Take 2 tablets (1,200 mg total) by mouth 2 (two) times daily as needed for to loosen phlegm.  Current Outpatient Medications (Analgesics):  .  acetaminophen (TYLENOL) 650 MG CR tablet, Take by mouth. [name]  allopurinol (ZYLOPRIM) 100 MG tablet, Take 2 tablets (200 mg total) by mouth daily. Take 200 mg by mouth daily. [name]  aspirin 81 MG EC tablet, Take 81 mg by mouth daily. .  traMADol (ULTRAM) 50 MG tablet, Take 1 tablet (50 mg total) by mouth every 6 (six) hours as needed. (Patient taking differently: Take 50 mg by mouth every 6 (six) hours as needed (pain).)   Current Outpatient Medications (Other):  [name]  buPROPion (WELLBUTRIN XL) 300 MG 24 hr tablet, Take 300 mg by mouth daily. .  Cholecalciferol 25 MCG (1000 UT) tablet, Take by mouth. .  cyclobenzaprine (FLEXERIL) 10 MG  tablet, Take 10 mg by mouth at bedtime. .  Diclofenac Sodium 2 % SOLN, Place 2 g onto the skin 2 (two) times daily. .  fluconazole (DIFLUCAN) 150 MG tablet, TAKE 1 TABLET BY MOUTH EVERY 3 DAYS AS NEEDED .  gabapentin (NEURONTIN) 100 MG capsule, Take 100 mg by mouth 2 (two) times daily as needed. .  gabapentin (NEURONTIN) 300 MG capsule, Take 300 mg by mouth at bedtime as needed. [name]  KLOR-CON M20 20 MEQ tablet, Take 1 tablet (20 mEq total) by mouth daily. OK TO TAKE AN EXTRA ON DAYS YOU TAKE EXTRA TORSEMIDE .  Menthol-Methyl Salicylate (MUSCLE RUB) 10-15 % CREA, Apply 1 application topically as needed for muscle pain. .  Multiple Vitamin (MULTIVITAMIN) capsule, Take 1 capsule by mouth daily. [name]  omeprazole (PRILOSEC) 20 MG capsule, Take 20 mg by mouth daily as needed (acid reflux).  [name]  PENNSAID 2 % SOLN, APPLY 2 PUMPS TOPICALLY TO THE AFFECTED AREA(S) TWICE DAILY AS DIRECTED .  valACYclovir (VALTREX) 1000 MG tablet, Take 500 mg by mouth daily as needed (cold sores).    Reviewed prior external information including notes and imaging from  primary care provider As well as notes that were available from care everywhere and other healthcare systems.  Past medical history, social, surgical and family history all reviewed in electronic medical record.  Hansen pertanent information unless stated regarding to the chief complaint.   Review of Systems:  Hansen headache, visual changes, nausea, vomiting, diarrhea, constipation, dizziness, abdominal pain, skin rash, fevers, chills, night sweats, weight loss, swollen lymph nodes,chest pain, shortness of breath, mood changes. POSITIVE muscle aches, body aches, joint swelling  Objective  Blood pressure 110/76, pulse 91, height 5\' 3"  (1.6 m), weight 241 lb (109.3 kg), SpO2 99 %.   General: Hansen apparent distress alert and oriented x3 mood and affect normal, dressed appropriately.  Overweight HEENT: Pupils equal, extraocular movements intact  Respiratory: Patient's  speak in full sentences and does not appear short of breath  Cardiovascular: Trace lower extremity edema, non tender, Hansen erythema  Gait mild antalgic Knee: Bilateral valgus deformity noted.  Abnormal thigh to calf ratio.  Tender to palpation over medial and PF joint line.  ROM decreased noted lacking the last 10 degrees of flexion on the left knee in the last 5 degrees on the right knee likely secondary to body habitus instability with valgus force.  painful patellar compression. Patellar glide with moderate crepitus. Patellar and quadriceps tendons unremarkable. Hamstring and quadriceps strength is normal.  After informed written and verbal consent, patient was seated on exam table. Right knee was prepped with alcohol swab and utilizing anterolateral approach, patient's right knee space was injected with 4:1  marcaine 0.5%: Kenalog 40mg /dL. Patient tolerated the procedure well without immediate complications.  After informed written and verbal consent, patient was seated on exam table. Left knee was prepped with alcohol swab and utilizing anterolateral approach, patient's left knee space was injected with 4:1  marcaine 0.5%: Kenalog 40mg /dL. Patient tolerated the procedure well without immediate complications.   Impression and Recommendations:     The above documentation has been reviewed and is accurate and complete Lyndal Pulley, DO

## 2020-12-08 ENCOUNTER — Other Ambulatory Visit: Payer: Self-pay

## 2020-12-08 ENCOUNTER — Ambulatory Visit: Payer: BC Managed Care – PPO | Admitting: Family Medicine

## 2020-12-08 ENCOUNTER — Encounter: Payer: Self-pay | Admitting: Family Medicine

## 2020-12-08 DIAGNOSIS — M170 Bilateral primary osteoarthritis of knee: Secondary | ICD-10-CM

## 2020-12-08 MED ORDER — DICLOFENAC SODIUM 2 % EX SOLN: 2.0000 g | Freq: Two times a day (BID) | CUTANEOUS | 3 refills | Status: DC

## 2020-12-08 NOTE — Assessment & Plan Note (Signed)
Bilateral injections given today for chronic problem with exacerbation.  Patient wants to avoid any type of surgical intervention.  We discussed that most of her knee arthritis is under the patellofemoral joint.  We discussed potential advanced imaging as well.  Patient declined as well.  Discussed home exercises discussed weight loss follow-up again in 3 months

## 2020-12-08 NOTE — Patient Instructions (Addendum)
Injected both knees today Will try calling in Pennsaid again-Voltaren gel if not Keep working on the weight See me again in 3 months

## 2020-12-18 ENCOUNTER — Other Ambulatory Visit: Payer: Self-pay | Admitting: Internal Medicine

## 2020-12-29 NOTE — Progress Notes (Signed)
I, Wendy Poet, LAT, ATC, am serving as scribe for Dr. Lynne Leader.  Suzanne Hansen is a 65 y.o. female who presents to [place] at [facility] today for f/u of L knee pain.  She was last seen by Dr. Tamala Julian on 12/08/20 for B knee pain, L>R, and had B knee steroid injections.  She's had viscosupplementation in the past but doesn't feel that this works as well as the steroid injections.  She has been shown a HEP.  Since her last visit, pt reports that she has been having L post knee pain since Saturday w/ no known MOI.  She has a hx of Stage 3 kidney dx and noted fluid build-up over the weekend.  She has been taking Extra Strength Tylenol and using topical Voltaren and Pennsaid.  Aggravating factors include walking and standing.  Diagnostic testing: B knee XR- 06/09/20; L knee MRI- 07/21/19; L knee XR- 05/31/19  Pertinent review of systems: No fevers or chills  Relevant historical information: Hypertension   Exam:  BP 104/76 (BP Location: Left Arm, Patient Position: Sitting, Cuff Size: Large)   Pulse 94   Ht 5\' 3"  (1.6 m)   Wt 248 lb 3.2 oz (112.6 kg)   SpO2 98%   BMI 43.97 kg/m  General: Well Developed, well nourished, and in no acute distress.   MSK: Left knee mild effusion normal knee motion tender palpation posterior lateral knee.  No significant swelling posterior lateral knee. Stable ligamentous exam. Guarding with McMurray test nondiagnostic. Intact strength.  Pain with resisted knee flexion.     Lab and Radiology Results  X-ray images left knee obtained today personally and independently interpreted Mild medial and lateral DJD.  Moderate patellofemoral DJD.  No acute fractures visible. Await formal radiology review  Procedure: Real-time Ultrasound Guided Injection of left knee superior lateral patellar space Device: Philips Affiniti 50G Images permanently stored and available for review in PACS Ultrasound evaluation prior to injection reveals mild  effusion.  Degenerative appearing lateral joint line with partially extruded lateral meniscus.  No significant abnormalities posterior knee especially focused on lateral posterior knee structures. Verbal informed consent obtained.  Discussed risks and benefits of procedure. Warned about infection bleeding damage to structures skin hypopigmentation and fat atrophy among others. Patient expresses understanding and agreement Time-out conducted.   Noted no overlying erythema, induration, or other signs of local infection.   Skin prepped in a sterile fashion.   Local anesthesia: Topical Ethyl chloride.   With sterile technique and under real time ultrasound guidance:  40 mg of Kenalog and 2 mL of Marcaine injected into knee joint. Fluid seen entering the joint capsule.   Completed without difficulty   Pain immediately resolved suggesting accurate placement of the medication.   Advised to call if fevers/chills, erythema, induration, drainage, or persistent bleeding.   Images permanently stored and available for review in the ultrasound unit.  Impression: Technically successful ultrasound guided injection.         Assessment and Plan: 65 y.o. female with left knee pain not improving with typical treatment.  Plan for repeat steroid injection intra-articular today.  Also plan for home exercise program taught in clinic today by ATC working on hamstring.  Additionally consider Durolane injection is already authorized if not improved.  If not better consider MRI or drilling as above.  Recheck back in about a month.  Refill gabapentin as she does get some benefit from it.   PDMP not reviewed this encounter. Orders Placed This  Encounter  Procedures  . [place] LIMITED JOINT SPACE STRUCTURES LOW LEFT(NO LINKED CHARGES)    Order Specific Question:   Reason for Exam (SYMPTOM  OR DIAGNOSIS REQUIRED)    Answer:   L knee pain    Order Specific Question:   Preferred imaging location?    Answer:   [facility]  . DG Knee AP/LAT W/[place] Left    Standing Status:   Future    Number of Occurrences:   1    Standing Expiration Date:   12/30/2021    Order Specific Question:   Reason for Exam (SYMPTOM  OR DIAGNOSIS REQUIRED)    Answer:   eval left knee    Order Specific Question:   Preferred imaging location?    Answer:   [facility]   Meds ordered this encounter  Medications  . gabapentin (NEURONTIN) 100 MG capsule    Sig: Take 1 capsule (100 mg total) by mouth 2 (two) times daily as needed.    Dispense:  180 capsule    Refill:  2     Discussed warning signs or symptoms. Please see discharge instructions. Patient expresses understanding.   The above documentation has been reviewed and is accurate and complete Lynne Leader, M.D.

## 2020-12-30 ENCOUNTER — Ambulatory Visit: Payer: BC Managed Care – PPO | Admitting: Family Medicine

## 2020-12-30 ENCOUNTER — Encounter: Payer: Self-pay | Admitting: Family Medicine

## 2020-12-30 ENCOUNTER — Ambulatory Visit: Payer: Self-pay

## 2020-12-30 ENCOUNTER — Other Ambulatory Visit: Payer: Self-pay

## 2020-12-30 ENCOUNTER — Ambulatory Visit (INDEPENDENT_AMBULATORY_CARE_PROVIDER_SITE_OTHER): Payer: BC Managed Care – PPO

## 2020-12-30 VITALS — BP 104/76 | HR 94 | Ht 63.0 in | Wt 248.2 lb

## 2020-12-30 DIAGNOSIS — M1712 Unilateral primary osteoarthritis, left knee: Secondary | ICD-10-CM | POA: Diagnosis not present

## 2020-12-30 DIAGNOSIS — M25562 Pain in left knee: Secondary | ICD-10-CM

## 2020-12-30 MED ORDER — GABAPENTIN 100 MG PO CAPS: 100.0000 mg | ORAL_CAPSULE | Freq: Two times a day (BID) | ORAL | 2 refills | Status: DC | PRN

## 2020-12-30 NOTE — Patient Instructions (Addendum)
Thank you for coming in today.  You received an injection today. Seek immediate medical attention if the joint becomes red, extremely painful, or is oozing fluid.  Please get an Xray today before you leave  Do the exercises.  Please perform the exercise program that we have prepared for you and gone over in detail on a daily basis.  In addition to the handout you were provided you can access your program through: [email or web address]   Your unique program code is:  [place] in 1 month.

## 2021-01-01 NOTE — Progress Notes (Signed)
X-ray left knee shows some arthritis changes present.

## 2021-01-05 ENCOUNTER — Telehealth (INDEPENDENT_AMBULATORY_CARE_PROVIDER_SITE_OTHER): Payer: BC Managed Care – PPO | Admitting: Internal Medicine

## 2021-01-05 DIAGNOSIS — R062 Wheezing: Secondary | ICD-10-CM | POA: Diagnosis not present

## 2021-01-05 DIAGNOSIS — R7303 Prediabetes: Secondary | ICD-10-CM

## 2021-01-05 DIAGNOSIS — R059 Cough, unspecified: Secondary | ICD-10-CM

## 2021-01-05 MED ORDER — PREDNISONE 10 MG PO TABS: ORAL_TABLET | ORAL | 0 refills | Status: DC

## 2021-01-05 MED ORDER — LEVOFLOXACIN 500 MG PO TABS: 500.0000 mg | ORAL_TABLET | Freq: Every day | ORAL | 0 refills | Status: AC

## 2021-01-05 MED ORDER — HYDROCOD POLST-CPM POLST ER 10-8 MG/5ML PO SUER: 5.0000 mL | Freq: Two times a day (BID) | ORAL | 0 refills | Status: DC | PRN

## 2021-01-05 NOTE — Progress Notes (Signed)
Patient ID: Suzanne Hansen, female   DOB: 1956-04-01, 65 y.o.   MRN: [id number]  Virtual Visit via Video Note  I connected with Suzanne Hansen on 01/05/21 at  8:20 AM EDT by a video enabled telemedicine application and verified that I am speaking with the correct person using two identifiers.  Location of all participants today Patient: at home Provider: at office   I discussed the limitations of evaluation and management by telemedicine and the availability of in person appointments. The patient expressed understanding and agreed to proceed.  History of Present Illness: Here with acute onset mild to mod 2-3 days ST, HA, general weakness and malaise, with prod cough greenish sputum, but Pt denies chest pain, increased sob or doe, wheezing, orthopnea, PND, increased LE swelling, palpitations, dizziness or syncope, except for mild wheezing and sob starting yesterday, prompting her to make appt.  Denies new neuro focal s/s.   Pt denies polydipsia, polyuria,  Pt denies wt loss, night sweats, loss of appetite, or other constitutional symptoms.  Tested at home covid neg x 2 (last Friday, then last night as well) Past Medical History:  Diagnosis Date  . ALLERGIC RHINITIS 05/09/2008  . Allergy   . ANXIETY 05/23/2007  . ARM PAIN, LEFT 10/23/2009  . Arthritis   . Asthma   . ASTHMA 05/23/2007  . Back pain   . Cardiomyopathy ([place]) 09/08/2015  . Chest pain   . CHF (congestive heart failure) ([place])   . Chronic diastolic heart failure ([place]) 01/22/2020  . Constipation   . CONTACT DERMATITIS 06/09/2009  . Diabetes mellitus without complication ([place])    type II  . Dyspnea    allergies-dust, cig smoke, rag weeds  . ENDOMETRIOSIS NOS 05/23/2007  . FACIAL PAIN 06/02/2010  . Food allergy   . FREQUENCY, URINARY 05/17/2010  . GERD (gastroesophageal reflux disease)   . GLUCOSE INTOLERANCE 08/28/2009  . HERPES SIMPLEX, UNCOMPLICATED 8/88/9169  . Hyperlipidemia 08/28/2015  . HYPERTHYROIDISM 05/23/2007  .  Hypoactive thyroid   . HYPOTHYROIDISM 05/09/2008  . Impaired glucose tolerance 01/28/2011  . INSOMNIA, HX OF 05/23/2007  . Joint pain   . LATERAL EPICONDYLITIS, RIGHT 03/10/2009  . LIPOMA 10/22/2010  . NECK PAIN 11/10/2010  . OBESITY 05/23/2007  . Osteopenia 09/28/2018  . Plantar fasciitis    Both feet  . Pre-diabetes   . SHOULDER PAIN, LEFT 05/17/2010  . SINUSITIS- ACUTE-NOS 11/03/2008  . SKIN LESION 11/10/2010  . Sleep apnea   . Stage III chronic kidney disease ([place])    Stage III  . Unspecified Chest Pain 07/25/2008  . UNSPECIFIED URTICARIA 06/04/2009  . URI 08/28/2009  . UTI 04/29/2008  . VITAMIN D DEFICIENCY 08/28/2009  . Wheezing 07/12/2010   Past Surgical History:  Procedure Laterality Date  . COLONOSCOPY    . ENDOMETRIAL ABLATION    . LAMINECTOMY  1996  . LAPAROTOMY     exploratory  . lower back surgery  07/06/2017   cyst  . POLYPECTOMY    . s/p endometrial ablation  12/06  . TUBAL LIGATION    . UTERINE FIBROID SURGERY    . WISDOM TOOTH EXTRACTION      reports that she quit smoking about 33 years ago. Her smoking use included cigarettes. She has a 6.00 pack-year smoking history. She has never used smokeless tobacco. She reports that she does not drink alcohol and does not use drugs. family history includes COPD in her father; Cancer in her maternal grandmother; Depression in her mother; Diabetes in her cousin  and sister; Heart disease in her cousin and mother; Hypertension in her cousin and father; Kidney disease in her sister. Allergies  Allergen Reactions  . Shellfish Allergy Anaphylaxis  . Clarithromycin Nausea And Vomiting  . Latex Itching and Other (See Comments)    ITCHING AND COLD SORES AROUND MOUTH WHEN LATEX GLOVES USED BY THE DENTIST/HYGENTIST  . Metronidazole Nausea And Vomiting  . Zostavax [Zoster Vaccine Live]     Arm swelled  . Adhesive [Tape] Dermatitis    Plastic tape   . Lisinopril Cough  . Other Dermatitis    Plastic tape   . Peanut Oil     Pt says  it's ok   Current Outpatient Medications on File Prior to Visit  Medication Sig Dispense Refill  . acetaminophen (TYLENOL) 650 MG CR tablet Take by mouth.    [name] albuterol (VENTOLIN HFA) 108 (90 Base) MCG/ACT inhaler Inhale 2 puffs into the lungs every 4 (four) hours as needed. 8 g 11  . allopurinol (ZYLOPRIM) 100 MG tablet Take 2 tablets (200 mg total) by mouth daily. Take 200 mg by mouth daily. 180 tablet 3  . aspirin 81 MG EC tablet Take 81 mg by mouth daily.    . budesonide-formoterol (SYMBICORT) 160-4.5 MCG/ACT inhaler Inhale 2 puffs into the lungs 2 (two) times daily. 10.2 g 11  . buPROPion (WELLBUTRIN XL) 300 MG 24 hr tablet Take 300 mg by mouth daily.    . Cholecalciferol 25 MCG (1000 UT) tablet Take by mouth.    . cyclobenzaprine (FLEXERIL) 10 MG tablet Take 10 mg by mouth at bedtime.    . Diclofenac Sodium 2 % SOLN Place 2 g onto the skin 2 (two) times daily. 112 g 3  . estradiol (VIVELLE-DOT) 0.05 MG/24HR patch 1 patch 2 (two) times a week.    . ezetimibe (ZETIA) 10 MG tablet Take 1 tablet (10 mg total) by mouth daily. 90 tablet 3  . fexofenadine (ALLEGRA) 180 MG tablet Take 180 mg by mouth daily.    . fluconazole (DIFLUCAN) 150 MG tablet TAKE 1 TABLET BY MOUTH EVERY 3 DAYS AS NEEDED 2 tablet 1  . fluticasone (FLONASE) 50 MCG/ACT nasal spray Place 2 sprays into both nostrils daily. (Patient taking differently: Place 2 sprays into both nostrils daily as needed for allergies.) 16 g 6  . gabapentin (NEURONTIN) 100 MG capsule Take 1 capsule (100 mg total) by mouth 2 (two) times daily as needed. 180 capsule 2  . guaiFENesin (MUCINEX) 600 MG 12 hr tablet Take 2 tablets (1,200 mg total) by mouth 2 (two) times daily as needed for to loosen phlegm. 60 tablet 1  . KLOR-CON M20 20 MEQ tablet Take 1 tablet (20 mEq total) by mouth daily. OK TO TAKE AN EXTRA ON DAYS YOU TAKE EXTRA TORSEMIDE 70 tablet 5  . levothyroxine (SYNTHROID) 112 MCG tablet TAKE 1 TABLET BY MOUTH EVERY DAY BEFORE BREAKFAST 90  tablet 2  . losartan (COZAAR) 25 MG tablet Take 0.5 tablets (12.5 mg total) by mouth daily. 45 tablet 2  . Menthol-Methyl Salicylate (MUSCLE RUB) 10-15 % CREA Apply 1 application topically as needed for muscle pain.    . Multiple Vitamin (MULTIVITAMIN) capsule Take 1 capsule by mouth daily.    [name] omeprazole (PRILOSEC) 20 MG capsule Take 20 mg by mouth daily as needed (acid reflux).     [name] PENNSAID 2 % SOLN APPLY 2 PUMPS TOPICALLY TO THE AFFECTED AREA(S) TWICE DAILY AS DIRECTED 112 g 2  . progesterone (PROMETRIUM) 100  MG capsule Take 100 mg by mouth daily.    [name] torsemide (DEMADEX) 20 MG tablet TAKE 2 TABLETS IN THE MORNING AND 1 IN THE EVENING. OK TO TAKE AN EXTRA DAILY AS NEEDED FOR WEIGHT GAIN 60 tablet 5  . traMADol (ULTRAM) 50 MG tablet Take 1 tablet (50 mg total) by mouth every 6 (six) hours as needed. (Patient taking differently: Take 50 mg by mouth every 6 (six) hours as needed (pain).) 30 tablet 0  . valACYclovir (VALTREX) 1000 MG tablet Take 500 mg by mouth daily as needed (cold sores).   0   No current facility-administered medications on file prior to visit.    Observations/Objective: Alert, NAD, appropriate mood and affect, resps normal, cn 2-12 intact, moves all 4s, no visible rash or swelling Lab Results  Component Value Date   WBC 6.8 10/06/2020   HGB 13.4 10/06/2020   HCT 41.0 10/06/2020   PLT 287.0 10/06/2020   GLUCOSE 130 (H) 10/06/2020   CHOL 204 (H) 10/06/2020   TRIG 125.0 10/06/2020   HDL 72.50 10/06/2020   LDLCALC 107 (H) 10/06/2020   ALT 18 10/06/2020   AST 19 10/06/2020   NA 140 10/06/2020   K 3.9 10/06/2020   CL 102 10/06/2020   CREATININE 1.29 (H) 10/06/2020   BUN 17 10/06/2020   CO2 30 10/06/2020   TSH 27.25 (H) 10/06/2020   INR 1.0 03/21/2017   HGBA1C 5.5 10/06/2020   MICROALBUR <0.7 10/02/2019   Assessment and Plan: See notes  Follow Up Instructions: See notes   I discussed the assessment and treatment plan with the patient. The patient was  provided an opportunity to ask questions and all were answered. The patient agreed with the plan and demonstrated an understanding of the instructions.   The patient was advised to call back or seek an in-person evaluation if the symptoms worsen or if the condition fails to improve as anticipated.   Cathlean Cower, MD

## 2021-01-05 NOTE — Patient Instructions (Signed)
Please take all new medication as prescribed 

## 2021-01-12 ENCOUNTER — Encounter: Payer: Self-pay | Admitting: Internal Medicine

## 2021-01-12 NOTE — Assessment & Plan Note (Signed)
Lab Results  Component Value Date   HGBA1C 5.5 10/06/2020   Stable, pt to continue current medical treatment  - diet

## 2021-01-12 NOTE — Assessment & Plan Note (Signed)
Mild to mod, for prednisone asd,  to f/u any worsening symptoms or concerns

## 2021-01-12 NOTE — Assessment & Plan Note (Signed)
Mild to mod, for antibx course,  Cough med prn,to f/u any worsening symptoms or concerns 

## 2021-01-13 ENCOUNTER — Encounter: Payer: Self-pay | Admitting: Internal Medicine

## 2021-01-13 ENCOUNTER — Other Ambulatory Visit: Payer: Self-pay

## 2021-01-13 ENCOUNTER — Ambulatory Visit: Payer: BC Managed Care – PPO | Admitting: Internal Medicine

## 2021-01-13 VITALS — BP 110/70 | HR 65 | Ht 63.0 in | Wt 258.0 lb

## 2021-01-13 DIAGNOSIS — M79662 Pain in left lower leg: Secondary | ICD-10-CM | POA: Diagnosis not present

## 2021-01-13 DIAGNOSIS — M25462 Effusion, left knee: Secondary | ICD-10-CM

## 2021-01-13 DIAGNOSIS — M7989 Other specified soft tissue disorders: Secondary | ICD-10-CM

## 2021-01-13 DIAGNOSIS — I10 Essential (primary) hypertension: Secondary | ICD-10-CM

## 2021-01-13 MED ORDER — TRAMADOL HCL 50 MG PO TABS: 50.0000 mg | ORAL_TABLET | Freq: Four times a day (QID) | ORAL | 0 refills | Status: DC | PRN

## 2021-01-13 NOTE — Patient Instructions (Signed)
Please take all new medication as prescribed - the tramadol as needed  You will be contacted regarding the referral for: venous doppler for the left leg  Please continue all other medications as before, and refills have been done if requested.  Please have the pharmacy call with any other refills you may need.  Please keep your appointments with your specialists as you may have planned - Dr Rush Farmer for next tuesday

## 2021-01-13 NOTE — Progress Notes (Signed)
Patient ID: Suzanne Hansen, female   DOB: 06/15/1956, 65 y.o.   MRN: [id number]        Chief Complaint: follow up left knee, and left leg pain and swelling       HPI:  [name] is a 65 y.o. female here with chronic left knee, most recently controlled with getting cortisone per Dr Georgina Snell about every 10 wks, last one done about 2 wks ago, but pain this time not well improved, with persistent pain and swelling to the knee, worse to stand, take any steps or any motion having to do with flexion of the knee.  No falls but has been unstable. Has been suggested for MRI in the past.  Has also seen Dr Rush Farmer in past and has an appt in 1 wk.  Also, in the last 3 days also now with pain and swelling to the left leg below the knee as well, with soreness to touch of the whole leg, maybe more to the calf.  Pt denies chest pain, increased sob or doe, wheezing, orthopnea, PND, increased LE swelling, palpitations, dizziness or syncope.   Pt denies polydipsia, polyuria, Denies any new focal neuro s/s.   Pt denies fever, wt loss, night sweats, loss of appetite, or other constitutional symptoms  In fact has gained wt due to less active due to the knee pain.       Wt Readings from Last 3 Encounters:  01/13/21 258 lb (117 kg)  12/30/20 248 lb 3.2 oz (112.6 kg)  12/08/20 241 lb (109.3 kg)   BP Readings from Last 3 Encounters:  01/13/21 110/70  12/30/20 104/76  12/08/20 110/76         Past Medical History:  Diagnosis Date  . ALLERGIC RHINITIS 05/09/2008  . Allergy   . ANXIETY 05/23/2007  . ARM PAIN, LEFT 10/23/2009  . Arthritis   . Asthma   . ASTHMA 05/23/2007  . Back pain   . Cardiomyopathy ([place]) 09/08/2015  . Chest pain   . CHF (congestive heart failure) ([place])   . Chronic diastolic heart failure ([place]) 01/22/2020  . Constipation   . CONTACT DERMATITIS 06/09/2009  . Diabetes mellitus without complication ([place])    type II  . Dyspnea    allergies-dust, cig smoke, rag weeds  . ENDOMETRIOSIS NOS 05/23/2007   . FACIAL PAIN 06/02/2010  . Food allergy   . FREQUENCY, URINARY 05/17/2010  . GERD (gastroesophageal reflux disease)   . GLUCOSE INTOLERANCE 08/28/2009  . HERPES SIMPLEX, UNCOMPLICATED 8/82/8003  . Hyperlipidemia 08/28/2015  . HYPERTHYROIDISM 05/23/2007  . Hypoactive thyroid   . HYPOTHYROIDISM 05/09/2008  . Impaired glucose tolerance 01/28/2011  . INSOMNIA, HX OF 05/23/2007  . Joint pain   . LATERAL EPICONDYLITIS, RIGHT 03/10/2009  . LIPOMA 10/22/2010  . NECK PAIN 11/10/2010  . OBESITY 05/23/2007  . Osteopenia 09/28/2018  . Plantar fasciitis    Both feet  . Pre-diabetes   . SHOULDER PAIN, LEFT 05/17/2010  . SINUSITIS- ACUTE-NOS 11/03/2008  . SKIN LESION 11/10/2010  . Sleep apnea   . Stage III chronic kidney disease ([place])    Stage III  . Unspecified Chest Pain 07/25/2008  . UNSPECIFIED URTICARIA 06/04/2009  . URI 08/28/2009  . UTI 04/29/2008  . VITAMIN D DEFICIENCY 08/28/2009  . Wheezing 07/12/2010   Past Surgical History:  Procedure Laterality Date  . COLONOSCOPY    . ENDOMETRIAL ABLATION    . LAMINECTOMY  1996  . LAPAROTOMY     exploratory  . lower back surgery  07/06/2017   cyst  . POLYPECTOMY    . s/p endometrial ablation  12/06  . TUBAL LIGATION    . UTERINE FIBROID SURGERY    . WISDOM TOOTH EXTRACTION      reports that she quit smoking about 33 years ago. Her smoking use included cigarettes. She has a 6.00 pack-year smoking history. She has never used smokeless tobacco. She reports that she does not drink alcohol and does not use drugs. family history includes COPD in her father; Cancer in her maternal grandmother; Depression in her mother; Diabetes in her cousin and sister; Heart disease in her cousin and mother; Hypertension in her cousin and father; Kidney disease in her sister. Allergies  Allergen Reactions  . Shellfish Allergy Anaphylaxis  . Clarithromycin Nausea And Vomiting and Nausea Only  . Latex Itching and Other (See Comments)    ITCHING AND COLD SORES AROUND  MOUTH WHEN LATEX GLOVES USED BY THE DENTIST/HYGENTIST  . Metronidazole Nausea And Vomiting and Nausea Only  . Zostavax [Zoster Vaccine Live]     Arm swelled  . Adhesive [Tape] Dermatitis    Plastic tape   . Amiloride     Other reaction(s): foot numbness  . Gadobenate   . Lisinopril Cough  . Other Dermatitis    Plastic tape   . Peanut Oil     Pt says it's ok   Current Outpatient Medications on File Prior to Visit  Medication Sig Dispense Refill  . acetaminophen (TYLENOL) 650 MG CR tablet Take by mouth.    [name] albuterol (VENTOLIN HFA) 108 (90 Base) MCG/ACT inhaler Inhale 2 puffs into the lungs every 4 (four) hours as needed. 8 g 11  . allopurinol (ZYLOPRIM) 100 MG tablet Take 2 tablets (200 mg total) by mouth daily. Take 200 mg by mouth daily. 180 tablet 3  . aspirin 81 MG EC tablet Take 81 mg by mouth daily.    . budesonide-formoterol (SYMBICORT) 160-4.5 MCG/ACT inhaler Inhale 2 puffs into the lungs 2 (two) times daily. 10.2 g 11  . buPROPion (WELLBUTRIN XL) 300 MG 24 hr tablet Take 300 mg by mouth daily.    . chlorpheniramine-HYDROcodone (TUSSIONEX PENNKINETIC ER) 10-8 MG/5ML SUER Take 5 mLs by mouth every 12 (twelve) hours as needed for cough. 180 mL 0  . Cholecalciferol 25 MCG (1000 UT) tablet Take by mouth.    . cyclobenzaprine (FLEXERIL) 10 MG tablet Take 10 mg by mouth at bedtime.    . Diclofenac Sodium 2 % SOLN Place 2 g onto the skin 2 (two) times daily. 112 g 3  . estradiol (VIVELLE-DOT) 0.05 MG/24HR patch 1 patch 2 (two) times a week.    . ezetimibe (ZETIA) 10 MG tablet Take 1 tablet (10 mg total) by mouth daily. 90 tablet 3  . fexofenadine (ALLEGRA) 180 MG tablet Take 180 mg by mouth daily.    . fluconazole (DIFLUCAN) 150 MG tablet TAKE 1 TABLET BY MOUTH EVERY 3 DAYS AS NEEDED 2 tablet 1  . fluticasone (FLONASE) 50 MCG/ACT nasal spray Place 2 sprays into both nostrils daily. (Patient taking differently: Place 2 sprays into both nostrils daily as needed for allergies.) 16 g 6   . gabapentin (NEURONTIN) 100 MG capsule Take 1 capsule (100 mg total) by mouth 2 (two) times daily as needed. 180 capsule 2  . guaiFENesin (MUCINEX) 600 MG 12 hr tablet Take 2 tablets (1,200 mg total) by mouth 2 (two) times daily as needed for to loosen phlegm. 60 tablet 1  . KLOR-CON  M20 20 MEQ tablet Take 1 tablet (20 mEq total) by mouth daily. OK TO TAKE AN EXTRA ON DAYS YOU TAKE EXTRA TORSEMIDE 70 tablet 5  . levothyroxine (SYNTHROID) 112 MCG tablet TAKE 1 TABLET BY MOUTH EVERY DAY BEFORE BREAKFAST 90 tablet 2  . losartan (COZAAR) 25 MG tablet Take 0.5 tablets (12.5 mg total) by mouth daily. 45 tablet 2  . Menthol-Methyl Salicylate (MUSCLE RUB) 10-15 % CREA Apply 1 application topically as needed for muscle pain.    . Multiple Vitamin (MULTIVITAMIN) capsule Take 1 capsule by mouth daily.    [name] omeprazole (PRILOSEC) 20 MG capsule Take 20 mg by mouth daily as needed (acid reflux).     [name] PENNSAID 2 % SOLN APPLY 2 PUMPS TOPICALLY TO THE AFFECTED AREA(S) TWICE DAILY AS DIRECTED 112 g 2  . predniSONE (DELTASONE) 10 MG tablet 3 tabs by mouth per day for 3 days,2tabs per day for 3 days,1tab per day for 3 days 18 tablet 0  . progesterone (PROMETRIUM) 100 MG capsule Take 100 mg by mouth daily.    [name] torsemide (DEMADEX) 20 MG tablet TAKE 2 TABLETS IN THE MORNING AND 1 IN THE EVENING. OK TO TAKE AN EXTRA DAILY AS NEEDED FOR WEIGHT GAIN 60 tablet 5  . valACYclovir (VALTREX) 1000 MG tablet Take 500 mg by mouth daily as needed (cold sores).   0   No current facility-administered medications on file prior to visit.        ROS:  All others reviewed and negative.  Objective        PE:  BP 110/70 (BP Location: Left Arm, Patient Position: Sitting, Cuff Size: Large)   Pulse 65   Ht 5\' 3"  (1.6 m)   Wt 258 lb (117 kg)   SpO2 98%   BMI 45.70 kg/m                 Constitutional: Pt appears in NAD               HENT: Head: NCAT.                Right Ear: External ear normal.                 Left Ear:  External ear normal.                Eyes: . Pupils are equal, round, and reactive to light. Conjunctivae and EOM are normal               Nose: without d/c or deformity               Neck: Neck supple. Gross normal ROM               Cardiovascular: Normal rate and regular rhythm.                 Pulmonary/Chest: Effort normal and breath sounds without rales or wheezing.                Abd:  Soft, NT, ND, + BS, no organomegaly               Neurological: Pt is alert. At baseline orientation, motor grossly intact               Skin: Skin is warm. No rashes, no other new lesions, LLE edema - 1-2+ diffuse sweling and tender without cords or erythema, o/w neurovasc intact, left knee with 1+ effusion and limps  to walk               Psychiatric: Pt behavior is normal without agitation   Micro: none  Cardiac tracings I have personally interpreted today:  none  Pertinent Radiological findings (summarize): none   Lab Results  Component Value Date   WBC 6.8 10/06/2020   HGB 13.4 10/06/2020   HCT 41.0 10/06/2020   PLT 287.0 10/06/2020   GLUCOSE 130 (H) 10/06/2020   CHOL 204 (H) 10/06/2020   TRIG 125.0 10/06/2020   HDL 72.50 10/06/2020   LDLCALC 107 (H) 10/06/2020   ALT 18 10/06/2020   AST 19 10/06/2020   NA 140 10/06/2020   K 3.9 10/06/2020   CL 102 10/06/2020   CREATININE 1.29 (H) 10/06/2020   BUN 17 10/06/2020   CO2 30 10/06/2020   TSH 27.25 (H) 10/06/2020   INR 1.0 03/21/2017   HGBA1C 5.5 10/06/2020   MICROALBUR <0.7 10/02/2019   Assessment/Plan:  Emiyah Bow is a 65 y.o. Black or African American [2] female with  has a past medical history of ALLERGIC RHINITIS (05/09/2008), Allergy, ANXIETY (05/23/2007), ARM PAIN, LEFT (10/23/2009), Arthritis, Asthma, ASTHMA (05/23/2007), Back pain, Cardiomyopathy ([place]) (09/08/2015), Chest pain, CHF (congestive heart failure) ([place]), Chronic diastolic heart failure ([place]) (01/22/2020), Constipation, CONTACT DERMATITIS (06/09/2009), Diabetes mellitus  without complication ([place]), Dyspnea, ENDOMETRIOSIS NOS (05/23/2007), FACIAL PAIN (06/02/2010), Food allergy, FREQUENCY, URINARY (05/17/2010), GERD (gastroesophageal reflux disease), GLUCOSE INTOLERANCE (08/28/2009), HERPES SIMPLEX, UNCOMPLICATED (2/69/4854), Hyperlipidemia (08/28/2015), HYPERTHYROIDISM (05/23/2007), Hypoactive thyroid, HYPOTHYROIDISM (05/09/2008), Impaired glucose tolerance (01/28/2011), INSOMNIA, HX OF (05/23/2007), Joint pain, LATERAL EPICONDYLITIS, RIGHT (03/10/2009), LIPOMA (10/22/2010), NECK PAIN (11/10/2010), OBESITY (05/23/2007), Osteopenia (09/28/2018), Plantar fasciitis, Pre-diabetes, SHOULDER PAIN, LEFT (05/17/2010), SINUSITIS- ACUTE-NOS (11/03/2008), SKIN LESION (11/10/2010), Sleep apnea, Stage III chronic kidney disease ([place]), Unspecified Chest Pain (07/25/2008), UNSPECIFIED URTICARIA (06/04/2009), URI (08/28/2009), UTI (04/29/2008), VITAMIN D DEFICIENCY (08/28/2009), and Wheezing (07/12/2010).  Pain and swelling of left lower leg I supect sympathetic swelling related to knee but cant r/o dvt- for stat venous dopplers  Effusion of left knee With pain worsening x 2 wk - for tramadol, f/u Dr Rush Farmer, may need MRI and/or surgury soon  Essential (primary) hypertension BP Readings from Last 3 Encounters:  01/13/21 110/70  12/30/20 104/76  12/08/20 110/76   Stable, pt to continue medical treatment  - losartan, demedex   Followup: Return if symptoms worsen or fail to improve.  Cathlean Cower, MD 01/18/2021 1:59 AM [facility]

## 2021-01-14 ENCOUNTER — Ambulatory Visit (HOSPITAL_COMMUNITY)
Admission: RE | Admit: 2021-01-14 | Discharge: 2021-01-14 | Disposition: A | Discharge status: Home / Self Care | Payer: BC Managed Care – PPO | Source: Ambulatory Visit | Attending: Internal Medicine | Admitting: Internal Medicine

## 2021-01-14 ENCOUNTER — Encounter: Payer: Self-pay | Admitting: Internal Medicine

## 2021-01-14 DIAGNOSIS — M7989 Other specified soft tissue disorders: Secondary | ICD-10-CM | POA: Insufficient documentation

## 2021-01-14 DIAGNOSIS — M79662 Pain in left lower leg: Secondary | ICD-10-CM | POA: Diagnosis not present

## 2021-01-18 ENCOUNTER — Encounter: Payer: Self-pay | Admitting: Internal Medicine

## 2021-01-18 NOTE — Assessment & Plan Note (Signed)
With pain worsening x 2 wk - for tramadol, f/u Dr Rush Farmer, may need MRI and/or surgury soon

## 2021-01-18 NOTE — Assessment & Plan Note (Signed)
BP Readings from Last 3 Encounters:  01/13/21 110/70  12/30/20 104/76  12/08/20 110/76   Stable, pt to continue medical treatment  - losartan, demedex

## 2021-01-18 NOTE — Assessment & Plan Note (Signed)
I supect sympathetic swelling related to knee but cant r/o dvt- for stat venous dopplers

## 2021-01-19 ENCOUNTER — Other Ambulatory Visit: Payer: Self-pay

## 2021-01-19 ENCOUNTER — Ambulatory Visit: Payer: BC Managed Care – PPO | Admitting: Orthopaedic Surgery

## 2021-01-19 VITALS — Ht 63.0 in | Wt 252.0 lb

## 2021-01-19 DIAGNOSIS — G8929 Other chronic pain: Secondary | ICD-10-CM | POA: Diagnosis not present

## 2021-01-19 DIAGNOSIS — M25562 Pain in left knee: Secondary | ICD-10-CM

## 2021-01-19 MED ORDER — LIDOCAINE HCL 1 % IJ SOLN
3.0000 mL | INTRAMUSCULAR | Status: AC | PRN
  Administered 2021-01-19: 3 mL

## 2021-01-19 MED ORDER — METHYLPREDNISOLONE ACETATE 40 MG/ML IJ SUSP
40.0000 mg | INTRAMUSCULAR | Status: AC | PRN
  Administered 2021-01-19: 40 mg via INTRA_ARTICULAR

## 2021-01-19 NOTE — Progress Notes (Signed)
Office Visit Note   Patient: Suzanne Hansen           Date of Birth: 04-25-1956           MRN: [id number] Visit Date: 01/19/2021              Requested by: Biagio Borg, MD [address] [place],  [place] [address] PCP: Biagio Borg, MD   Assessment & Plan: Visit Diagnoses:  1. Chronic pain of left knee     Plan: I did place a steroid injection in her left knee today.  We talked about weight loss and activity modification.  She will continue anti-inflammatories as well.  Given the fact that she is having such swelling in her left knee as well as posterior medial pain with locking catching combined with the failure of conservative treatment including multiple steroid injections, a MRI of this left knee is now warranted to rule out a meniscal tear.  She does have a positive McMurray sign as well to the new compartment of that knee and given the well-maintained joint space in the medial lateral compartments on plain film, a MRI at this point is medically warranted.  All question concerns were answered and addressed.  We will see her in follow-up after the MRI is obtained of the left knee.  Follow-Up Instructions: No follow-ups on file.   Orders:  No orders of the defined types were placed in this encounter.  No orders of the defined types were placed in this encounter.     Procedures: Large Joint Inj: L knee on 01/19/2021 8:38 AM Indications: diagnostic evaluation and pain Details: 22 G 1.5 in needle, superolateral approach  Arthrogram: No  Medications: 3 mL lidocaine 1 %; 40 mg methylPREDNISolone acetate 40 MG/ML Outcome: tolerated well, no immediate complications Procedure, treatment alternatives, risks and benefits explained, specific risks discussed. Consent was given by the patient. Immediately prior to procedure a time out was called to verify the correct patient, procedure, equipment, support staff and site/side marked as required. Patient was prepped and draped in the  usual sterile fashion.       Clinical Data: No additional findings.   Subjective: Chief Complaint  Patient presents with  . Left Knee - Pain  The patient is a known seen for the first time as a patient but off actually taken care of her daughter before.  She comes in with chronic knee pain but has an acute worsening of this over the last 3 weeks.  It hurts in the back of her left knee.  She said the knee has been swelling and painful with weightbearing and pivoting types of activities.  She has tried activity modification.  She is taken Tylenol as well as tramadol.  She is try Voltaren gel and pen said and other topical anti-inflammatories.  She has not had surgery on that knee before.  Her BMI is almost 45.  She is not a diabetic.  A recent Doppler ultrasound of the left lower extremity was negative for DVT.  She also has had numerous injections in that knee in the past.  HPI  Review of Systems She currently denies any headache, chest pain, shortness of breath, fever, chills, nausea, vomiting  Objective: Vital Signs: Ht 5\' 3"  (1.6 m)   Wt 252 lb (114.3 kg)   BMI 44.64 kg/m   Physical Exam She is alert and orient x3 and in no acute distress Ortho Exam Examination of her left knee today does show  mild effusion.  She hurts quite a bit in the posterior aspect of her knee especially posterior medially.  She does have a positive Murray sign the medial compartment the knee.  Her left knee and right knee do hyperextend.  The knee feels immensely stable. Specialty Comments:  No specialty comments available.  Imaging: No results found. Recent x-rays of the left knee are reviewed and show patellofemoral arthritis but the medial lateral compartments are well maintained.  PMFS History: Patient Active Problem List   Diagnosis Date Noted  . Statin myopathy 10/15/2020  . Gout 10/15/2020  . Abnormal cervical Papanicolaou smear 07/30/2020  . Memory difficulties 05/13/2020  . Chronic back  pain 12/18/2019  . Yeast vaginitis 10/12/2019  . Overactive bladder 10/02/2019  . Spondylosis without myelopathy or radiculopathy, lumbar region 07/18/2019  . Essential (primary) hypertension 07/18/2019  . Effusion of left knee 05/31/2019  . Pain and swelling of left lower leg 05/31/2019  . Chronic low back pain 04/02/2019  . Left otitis media 12/17/2018  . At risk for diabetes mellitus 11/08/2018  . Pain in thoracic spine 11/08/2018  . Paresthesia of upper limb 11/08/2018  . Arthritis 10/29/2018  . Cervical radiculopathy 10/23/2018  . Osteopenia 09/28/2018  . Elevated blood uric acid level 09/28/2018  . Polyarthralgia 09/28/2018  . Paroxysmal atrial fibrillation ([place]) 09/18/2018  . OSA on CPAP 08/13/2018  . Stage 3 chronic kidney disease ([place]) 06/26/2018  . Rash 06/26/2018  . Wheezing 06/26/2018  . Left hip pain 05/27/2018  . Bicipital tendinitis of right shoulder 05/16/2018  . Bursitis of left hip 05/16/2018  . Chronic heart failure with preserved ejection fraction ([place]) 04/25/2018  . GERD (gastroesophageal reflux disease) 09/27/2017  . Acute shoulder bursitis, left 08/28/2017  . Lumbosacral radiculopathy at L4 06/14/2017  . 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 Easy bruising 03/29/2017  . Left leg paresthesias 03/29/2017  . Muscle cramping 03/20/2017  . 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 Pain in lower jaw 11/10/2016  . Cough 11/06/2016  . Vaginitis 09/17/2016  . Left wrist pain 09/16/2016  . Hypercoagulable state ([place]) 06/09/2016  . Iron deficiency 06/09/2016  . Synovial cyst of lumbar spine 06/09/2016  . Hypersomnia with sleep apnea 02/15/2016  . Extrinsic asthma 02/15/2016  . Ankle edema 02/15/2016  . Degenerative arthritis of knee, bilateral 01/21/2016  . Asthma with exacerbation 10/20/2015  . Acute sinus infection 09/29/2015  . Bilateral calf pain 08/31/2015  . Hyperlipidemia 08/28/2015  . Left knee pain  08/28/2015  . Varicose veins with pain 08/28/2015  . Peripheral edema 08/13/2015  . Injection site reaction 06/02/2015  . PHN (postherpetic neuralgia) 05/29/2015  . Shingles 06/10/2014  . Skin nodule 11/27/2012  . Encounter for well adult exam with abnormal findings 01/28/2011  . Prediabetes 01/28/2011  . Lipoma 10/22/2010  . Vitamin D deficiency 08/28/2009  . Hypothyroidism 05/09/2008  . Allergic rhinitis 05/09/2008  . HERPES ZOSTER W/NERVOUS COMPLICATION NEC 16/38/4665  . Hyperthyroidism 05/23/2007  . OBESITY 05/23/2007  . Anxiety state 05/23/2007  . Asthma 05/23/2007  . Morbid obesity with BMI of 40.0-44.9, adult ([place]) 05/23/2007  . INSOMNIA, HX OF 05/23/2007   Past Medical History:  Diagnosis Date  . ALLERGIC RHINITIS 05/09/2008  . Allergy   . ANXIETY 05/23/2007  . ARM PAIN, LEFT 10/23/2009  . Arthritis   . Asthma   . ASTHMA 05/23/2007  . Back pain   . Cardiomyopathy ([place]) 09/08/2015  . Chest pain   . CHF (congestive  heart failure) ([place])   . Chronic diastolic heart failure ([place]) 01/22/2020  . Constipation   . CONTACT DERMATITIS 06/09/2009  . Diabetes mellitus without complication ([place])    type II  . Dyspnea    allergies-dust, cig smoke, rag weeds  . ENDOMETRIOSIS NOS 05/23/2007  . FACIAL PAIN 06/02/2010  . Food allergy   . FREQUENCY, URINARY 05/17/2010  . GERD (gastroesophageal reflux disease)   . GLUCOSE INTOLERANCE 08/28/2009  . HERPES SIMPLEX, UNCOMPLICATED 02/01/9562  . Hyperlipidemia 08/28/2015  . HYPERTHYROIDISM 05/23/2007  . Hypoactive thyroid   . HYPOTHYROIDISM 05/09/2008  . Impaired glucose tolerance 01/28/2011  . INSOMNIA, HX OF 05/23/2007  . Joint pain   . LATERAL EPICONDYLITIS, RIGHT 03/10/2009  . LIPOMA 10/22/2010  . NECK PAIN 11/10/2010  . OBESITY 05/23/2007  . Osteopenia 09/28/2018  . Plantar fasciitis    Both feet  . Pre-diabetes   . SHOULDER PAIN, LEFT 05/17/2010  . SINUSITIS- ACUTE-NOS 11/03/2008  . SKIN LESION 11/10/2010  . Sleep apnea   . Stage III  chronic kidney disease ([place])    Stage III  . Unspecified Chest Pain 07/25/2008  . UNSPECIFIED URTICARIA 06/04/2009  . URI 08/28/2009  . UTI 04/29/2008  . VITAMIN D DEFICIENCY 08/28/2009  . Wheezing 07/12/2010    Family History  Problem Relation Age of Onset  . Diabetes Cousin   . Hypertension Cousin   . Heart disease Cousin   . Heart disease Mother        Rheumatic heart disease  . Depression Mother   . COPD Father   . Hypertension Father   . Cancer Maternal Grandmother        Breast  . Diabetes Sister   . Kidney disease Sister     Past Surgical History:  Procedure Laterality Date  . COLONOSCOPY    . ENDOMETRIAL ABLATION    . LAMINECTOMY  1996  . LAPAROTOMY     exploratory  . lower back surgery  07/06/2017   cyst  . POLYPECTOMY    . s/p endometrial ablation  12/06  . TUBAL LIGATION    . UTERINE FIBROID SURGERY    . WISDOM TOOTH EXTRACTION     Social History   Occupational History  . Occupation: SOCIAL WORKER    Employer: [employer]    Comment: [employer]  Tobacco Use  . Smoking status: Former Smoker    Packs/day: 0.50    Years: 12.00    Pack years: 6.00    Types: Cigarettes    Quit date: 11/05/1987    Years since quitting: 33.2  . Smokeless tobacco: Never Used  Vaping Use  . Vaping Use: Never used  Substance and Sexual Activity  . Alcohol use: No    Alcohol/week: 1.0 standard drink    Types: 1 Glasses of wine per week    Comment: WINE  . Drug use: No  . Sexual activity: Not on file

## 2021-01-20 ENCOUNTER — Encounter: Payer: Self-pay | Admitting: Internal Medicine

## 2021-01-20 DIAGNOSIS — E039 Hypothyroidism, unspecified: Secondary | ICD-10-CM

## 2021-01-25 ENCOUNTER — Other Ambulatory Visit (INDEPENDENT_AMBULATORY_CARE_PROVIDER_SITE_OTHER): Payer: BC Managed Care – PPO

## 2021-01-25 ENCOUNTER — Encounter: Payer: Self-pay | Admitting: Internal Medicine

## 2021-01-25 ENCOUNTER — Telehealth: Payer: Self-pay | Admitting: Cardiovascular Disease

## 2021-01-25 ENCOUNTER — Other Ambulatory Visit: Payer: Self-pay | Admitting: Internal Medicine

## 2021-01-25 DIAGNOSIS — E039 Hypothyroidism, unspecified: Secondary | ICD-10-CM

## 2021-01-25 DIAGNOSIS — I5032 Chronic diastolic (congestive) heart failure: Secondary | ICD-10-CM

## 2021-01-25 LAB — T4, FREE: Free T4: 0.69 ng/dL (ref 0.60–1.60)

## 2021-01-25 LAB — TSH: TSH: 24.12 u[IU]/mL — ABNORMAL HIGH (ref 0.35–4.50)

## 2021-01-25 MED ORDER — LEVOTHYROXINE SODIUM 137 MCG PO TABS: 137.0000 ug | ORAL_TABLET | Freq: Every day | ORAL | 3 refills | Status: DC

## 2021-01-25 NOTE — Telephone Encounter (Signed)
Spoke with patient of Dr. Oval Linsey  Weights fluctuate between 243 - 248lbs She may have gained 3lbs in 24 hours (244 >> 247lb) Fluctuating weights have been going on for a few months She is diligent about monitor her sodium/salt intake - although she drinks coke/ginger ale for GERD relief She had a bad URI and did have soup once then, noticed swelling at that time She has issues with her knee - swelling in one leg only (left > right) -- she is getting injections in her knee, using tramadol  -- she has MRI this week  She has prescription for torsemide 20mg  - 2 tabs in AM, 1 tab in PM - she has taken 1 extra dose of torsemide recently -- had some relief. She reports taking potassium with torsemide and uses extra potassium x1 if she takes extra PRN torsemide.   She has used her inhaler for tightness in chest. She has not been able to tolerate her CPAP as well recently, but she had settings adjusted  She states her breathing is "challenged" but she is not sure if his is fluid related to residual from bad URI. She gets out of breath climbing steps   Advised that she take torsemide as directed plus PRN instructions at least for 2-3 days (per 01/22/20 MD note:) If she has increased edema, with weight gain of 2 pounds in the day or 5 in a week or shortness of breath she can take an extra dose of torsemide.  She does need extra dose of torsemide and she will need to also take an extra potassium.   Will ask MD if patient needs BMET done to assess renal function (or other labs)

## 2021-01-25 NOTE — Telephone Encounter (Signed)
Patient returning call.

## 2021-01-25 NOTE — Telephone Encounter (Signed)
Spoke with Dr. Claiborne Billings - continue with plan as outlined. OK to order BMET  Spoke with patient. Aware to follow plan as previously discussed - extra PRN torsemide for 2-3 days plus K+ She will come for BMET end of this week or early next week

## 2021-01-25 NOTE — Telephone Encounter (Signed)
Pt c/o swelling: STAT is pt has developed SOB within 24 hours  1) How much weight have you gained and in what time span? 143 to 148  2) If swelling, where is the swelling located? Leg knee and  leg  3) Are you currently taking a fluid pill? Yes pt wants to know if she can increase the fluid pill  4) Are you currently SOB? Sometimes Just got over a uri, Pt does have Asthma   5) Do you have a log of your daily weights (if so, list)? No  6) Have you gained 3 pounds in a day or 5 pounds in a week? Yes  Have you traveled recently? No, Pt last travel was  to [place] in February 2022   Pt has MRI scheduled for 01/31/21 Pt states left leg has always been bigger than right.

## 2021-01-25 NOTE — Telephone Encounter (Signed)
Called to discuss swelling issues with patient, unable to reach. LVM to call back, left call back number.

## 2021-01-26 ENCOUNTER — Other Ambulatory Visit: Payer: Self-pay

## 2021-01-26 ENCOUNTER — Encounter: Payer: Self-pay | Admitting: Internal Medicine

## 2021-01-26 ENCOUNTER — Ambulatory Visit: Payer: BC Managed Care – PPO | Admitting: Internal Medicine

## 2021-01-26 DIAGNOSIS — Z6841 Body Mass Index (BMI) 40.0 and over, adult: Secondary | ICD-10-CM

## 2021-01-26 DIAGNOSIS — J45901 Unspecified asthma with (acute) exacerbation: Secondary | ICD-10-CM

## 2021-01-26 DIAGNOSIS — E038 Other specified hypothyroidism: Secondary | ICD-10-CM

## 2021-01-26 DIAGNOSIS — R21 Rash and other nonspecific skin eruption: Secondary | ICD-10-CM

## 2021-01-26 MED ORDER — NALTREXONE HCL 50 MG PO TABS: 50.0000 mg | ORAL_TABLET | Freq: Every day | ORAL | 1 refills | Status: DC

## 2021-01-26 MED ORDER — PREDNISONE 10 MG PO TABS: ORAL_TABLET | ORAL | 0 refills | Status: DC

## 2021-01-26 MED ORDER — CLOTRIMAZOLE-BETAMETHASONE 1-0.05 % EX CREA: TOPICAL_CREAM | CUTANEOUS | 1 refills | Status: DC

## 2021-01-26 NOTE — Progress Notes (Signed)
Patient ID: Laranda Burkemper, female   DOB: 04/04/1956, 65 y.o.   MRN: [id number]        Chief Complaint: new rash, asthma exacerbation, obesity, and low thyroid       HPI:  Sheritta Plourde is a 65 y.o. female here with c/o 1 wk onset patch of itchy scaly rash enlarging it seems to the base right neck, without pain or fever.  No overt contact problem, nothing seems to make better or worse.  Pt denies chest pain, orthopnea, PND, increased LE swelling, palpitations, dizziness or syncope, except has had several days worsening scant prod cough, wheezing, sob/doe only a little improved with recent prednisone - was better, then worse again.  Asks to try to lose wt, alreaady taking wellbutrin, has not been taking naloxone before.  Denies hyper or hypo thyroid symptoms such as voice, skin or hair change.   Pt denies polydipsia, polyuria, denies new focal neuro s/s.   Pt denies fever, wt loss, night sweats, loss of appetite, or other constitutional symptoms      Wt Readings from Last 3 Encounters:  01/26/21 244 lb (110.7 kg)  01/19/21 252 lb (114.3 kg)  01/13/21 258 lb (117 kg)   BP Readings from Last 3 Encounters:  01/26/21 120/76  01/13/21 110/70  12/30/20 104/76         Past Medical History:  Diagnosis Date  . ALLERGIC RHINITIS 05/09/2008  . Allergy   . ANXIETY 05/23/2007  . ARM PAIN, LEFT 10/23/2009  . Arthritis   . Asthma   . ASTHMA 05/23/2007  . Back pain   . Cardiomyopathy ([place]) 09/08/2015  . Chest pain   . CHF (congestive heart failure) ([place])   . Chronic diastolic heart failure ([place]) 01/22/2020  . Constipation   . CONTACT DERMATITIS 06/09/2009  . Diabetes mellitus without complication ([place])    type II  . Dyspnea    allergies-dust, cig smoke, rag weeds  . ENDOMETRIOSIS NOS 05/23/2007  . FACIAL PAIN 06/02/2010  . Food allergy   . FREQUENCY, URINARY 05/17/2010  . GERD (gastroesophageal reflux disease)   . GLUCOSE INTOLERANCE 08/28/2009  . HERPES SIMPLEX, UNCOMPLICATED 3/41/9622  .  Hyperlipidemia 08/28/2015  . HYPERTHYROIDISM 05/23/2007  . Hypoactive thyroid   . HYPOTHYROIDISM 05/09/2008  . Impaired glucose tolerance 01/28/2011  . INSOMNIA, HX OF 05/23/2007  . Joint pain   . LATERAL EPICONDYLITIS, RIGHT 03/10/2009  . LIPOMA 10/22/2010  . NECK PAIN 11/10/2010  . OBESITY 05/23/2007  . Osteopenia 09/28/2018  . Plantar fasciitis    Both feet  . Pre-diabetes   . SHOULDER PAIN, LEFT 05/17/2010  . SINUSITIS- ACUTE-NOS 11/03/2008  . SKIN LESION 11/10/2010  . Sleep apnea   . Stage III chronic kidney disease ([place])    Stage III  . Unspecified Chest Pain 07/25/2008  . UNSPECIFIED URTICARIA 06/04/2009  . URI 08/28/2009  . UTI 04/29/2008  . VITAMIN D DEFICIENCY 08/28/2009  . Wheezing 07/12/2010   Past Surgical History:  Procedure Laterality Date  . COLONOSCOPY    . ENDOMETRIAL ABLATION    . LAMINECTOMY  1996  . LAPAROTOMY     exploratory  . lower back surgery  07/06/2017   cyst  . POLYPECTOMY    . s/p endometrial ablation  12/06  . TUBAL LIGATION    . UTERINE FIBROID SURGERY    . WISDOM TOOTH EXTRACTION      reports that she quit smoking about 33 years ago. Her smoking use included cigarettes. She has a 6.00 pack-year  smoking history. She has never used smokeless tobacco. She reports that she does not drink alcohol and does not use drugs. family history includes COPD in her father; Cancer in her maternal grandmother; Depression in her mother; Diabetes in her cousin and sister; Heart disease in her cousin and mother; Hypertension in her cousin and father; Kidney disease in her sister. Allergies  Allergen Reactions  . Shellfish Allergy Anaphylaxis  . Clarithromycin Nausea And Vomiting and Nausea Only  . Latex Itching and Other (See Comments)    ITCHING AND COLD SORES AROUND MOUTH WHEN LATEX GLOVES USED BY THE DENTIST/HYGENTIST  . Metronidazole Nausea And Vomiting and Nausea Only  . Zostavax [Zoster Vaccine Live]     Arm swelled  . Adhesive [Tape] Dermatitis    Plastic tape    . Amiloride     Other reaction(s): foot numbness  . Gadobenate   . Lisinopril Cough  . Other Dermatitis    Plastic tape   . Peanut Oil     Pt says it's ok   Current Outpatient Medications on File Prior to Visit  Medication Sig Dispense Refill  . acetaminophen (TYLENOL) 650 MG CR tablet Take by mouth.    [name] albuterol (VENTOLIN HFA) 108 (90 Base) MCG/ACT inhaler Inhale 2 puffs into the lungs every 4 (four) hours as needed. 8 g 11  . allopurinol (ZYLOPRIM) 100 MG tablet Take 2 tablets (200 mg total) by mouth daily. Take 200 mg by mouth daily. 180 tablet 3  . aspirin 81 MG EC tablet Take 81 mg by mouth daily.    . budesonide-formoterol (SYMBICORT) 160-4.5 MCG/ACT inhaler Inhale 2 puffs into the lungs 2 (two) times daily. 10.2 g 11  . buPROPion (WELLBUTRIN XL) 300 MG 24 hr tablet Take 300 mg by mouth daily.    . chlorpheniramine-HYDROcodone (TUSSIONEX PENNKINETIC ER) 10-8 MG/5ML SUER Take 5 mLs by mouth every 12 (twelve) hours as needed for cough. 180 mL 0  . Cholecalciferol 25 MCG (1000 UT) tablet Take by mouth.    . cyclobenzaprine (FLEXERIL) 10 MG tablet Take 10 mg by mouth at bedtime.    . Diclofenac Sodium 2 % SOLN Place 2 g onto the skin 2 (two) times daily. 112 g 3  . estradiol (VIVELLE-DOT) 0.05 MG/24HR patch 1 patch 2 (two) times a week.    . ezetimibe (ZETIA) 10 MG tablet Take 1 tablet (10 mg total) by mouth daily. 90 tablet 3  . fexofenadine (ALLEGRA) 180 MG tablet Take 180 mg by mouth daily.    . fluconazole (DIFLUCAN) 150 MG tablet TAKE 1 TABLET BY MOUTH EVERY 3 DAYS AS NEEDED 2 tablet 1  . fluticasone (FLONASE) 50 MCG/ACT nasal spray Place 2 sprays into both nostrils daily. (Patient taking differently: Place 2 sprays into both nostrils daily as needed for allergies.) 16 g 6  . gabapentin (NEURONTIN) 100 MG capsule Take 1 capsule (100 mg total) by mouth 2 (two) times daily as needed. 180 capsule 2  . guaiFENesin (MUCINEX) 600 MG 12 hr tablet Take 2 tablets (1,200 mg total) by  mouth 2 (two) times daily as needed for to loosen phlegm. 60 tablet 1  . KLOR-CON M20 20 MEQ tablet Take 1 tablet (20 mEq total) by mouth daily. OK TO TAKE AN EXTRA ON DAYS YOU TAKE EXTRA TORSEMIDE 70 tablet 5  . levothyroxine (SYNTHROID) 137 MCG tablet Take 1 tablet (137 mcg total) by mouth daily before breakfast. 90 tablet 3  . losartan (COZAAR) 25 MG tablet Take 0.5 tablets (  12.5 mg total) by mouth daily. 45 tablet 2  . Menthol-Methyl Salicylate (MUSCLE RUB) 10-15 % CREA Apply 1 application topically as needed for muscle pain.    . Multiple Vitamin (MULTIVITAMIN) capsule Take 1 capsule by mouth daily.    [name] omeprazole (PRILOSEC) 20 MG capsule Take 20 mg by mouth daily as needed (acid reflux).     [name] PENNSAID 2 % SOLN APPLY 2 PUMPS TOPICALLY TO THE AFFECTED AREA(S) TWICE DAILY AS DIRECTED 112 g 2  . progesterone (PROMETRIUM) 100 MG capsule Take 100 mg by mouth daily.    [name] torsemide (DEMADEX) 20 MG tablet TAKE 2 TABLETS IN THE MORNING AND 1 IN THE EVENING. OK TO TAKE AN EXTRA DAILY AS NEEDED FOR WEIGHT GAIN 60 tablet 5  . traMADol (ULTRAM) 50 MG tablet Take 1 tablet (50 mg total) by mouth every 6 (six) hours as needed. 30 tablet 0  . valACYclovir (VALTREX) 1000 MG tablet Take 500 mg by mouth daily as needed (cold sores).   0   No current facility-administered medications on file prior to visit.        ROS:  All others reviewed and negative.  Objective        PE:  BP 120/76 (BP Location: Left Arm, Patient Position: Sitting, Cuff Size: Large)   Pulse 89   Temp 98.5 F (36.9 C) (Oral)   Ht 5\' 3"  (1.6 m)   Wt 244 lb (110.7 kg)   SpO2 98%   BMI 43.22 kg/m                 Constitutional: Pt appears in NAD               HENT: Head: NCAT.                Right Ear: External ear normal.                 Left Ear: External ear normal.                Eyes: . Pupils are equal, round, and reactive to light. Conjunctivae and EOM are normal               Nose: without d/c or deformity                Neck: Neck supple. Gross normal ROM               Cardiovascular: Normal rate and regular rhythm.                 Pulmonary/Chest: Effort normal and breath sounds decreased without rales but with few bilat wheezing.                Abd:  Soft, NT, ND, + BS, no organomegaly               Neurological: Pt is alert. At baseline orientation, motor grossly intact               Skin: Skin is warm. 1.5 cm area scaly rash to right base of neck, no other new lesions, LE edema - trace bilat               Psychiatric: Pt behavior is normal without agitation   Micro: none  Cardiac tracings I have personally interpreted today:  none  Pertinent Radiological findings (summarize): none   Lab Results  Component Value Date   WBC 6.8 10/06/2020   HGB 13.4  10/06/2020   HCT 41.0 10/06/2020   PLT 287.0 10/06/2020   GLUCOSE 130 (H) 10/06/2020   CHOL 204 (H) 10/06/2020   TRIG 125.0 10/06/2020   HDL 72.50 10/06/2020   LDLCALC 107 (H) 10/06/2020   ALT 18 10/06/2020   AST 19 10/06/2020   NA 140 10/06/2020   K 3.9 10/06/2020   CL 102 10/06/2020   CREATININE 1.29 (H) 10/06/2020   BUN 17 10/06/2020   CO2 30 10/06/2020   TSH 24.12 (H) 01/25/2021   INR 1.0 03/21/2017   HGBA1C 5.5 10/06/2020   MICROALBUR <0.7 10/02/2019   Assessment/Plan:  Shacara Binegar is a 65 y.o. Black or African American [2] female with  has a past medical history of ALLERGIC RHINITIS (05/09/2008), Allergy, ANXIETY (05/23/2007), ARM PAIN, LEFT (10/23/2009), Arthritis, Asthma, ASTHMA (05/23/2007), Back pain, Cardiomyopathy ([place]) (09/08/2015), Chest pain, CHF (congestive heart failure) ([place]), Chronic diastolic heart failure ([place]) (01/22/2020), Constipation, CONTACT DERMATITIS (06/09/2009), Diabetes mellitus without complication ([place]), Dyspnea, ENDOMETRIOSIS NOS (05/23/2007), FACIAL PAIN (06/02/2010), Food allergy, FREQUENCY, URINARY (05/17/2010), GERD (gastroesophageal reflux disease), GLUCOSE INTOLERANCE (08/28/2009), HERPES SIMPLEX, UNCOMPLICATED  (12/08/6008), Hyperlipidemia (08/28/2015), HYPERTHYROIDISM (05/23/2007), Hypoactive thyroid, HYPOTHYROIDISM (05/09/2008), Impaired glucose tolerance (01/28/2011), INSOMNIA, HX OF (05/23/2007), Joint pain, LATERAL EPICONDYLITIS, RIGHT (03/10/2009), LIPOMA (10/22/2010), NECK PAIN (11/10/2010), OBESITY (05/23/2007), Osteopenia (09/28/2018), Plantar fasciitis, Pre-diabetes, SHOULDER PAIN, LEFT (05/17/2010), SINUSITIS- ACUTE-NOS (11/03/2008), SKIN LESION (11/10/2010), Sleep apnea, Stage III chronic kidney disease ([place]), Unspecified Chest Pain (07/25/2008), UNSPECIFIED URTICARIA (06/04/2009), URI (08/28/2009), UTI (04/29/2008), VITAMIN D DEFICIENCY (08/28/2009), and Wheezing (07/12/2010).  Rash Etiology unclear, likely inflammatory - for lotrisone qd prn,  to f/u any worsening symptoms or concerns  Asthma with exacerbation Mild persistent, cont inhaler and for predpac asd repeat,  to f/u any worsening symptoms or concerns  OBESITY Ok to add naloxone to wellbutrin,  to f/u any worsening symptoms or concerns  Hypothyroidism Lab Results  Component Value Date   TSH 24.12 (H) 01/25/2021   Stable, pt to increase levothyroxine to 137 mg with f/u lab 4 wks  Current Outpatient Medications (Endocrine & Metabolic):  .  estradiol (VIVELLE-DOT) 0.05 MG/24HR patch, 1 patch 2 (two) times a week. .  levothyroxine (SYNTHROID) 137 MCG tablet, Take 1 tablet (137 mcg total) by mouth daily before breakfast. .  progesterone (PROMETRIUM) 100 MG capsule, Take 100 mg by mouth daily. .  predniSONE (DELTASONE) 10 MG tablet, 3 tabs by mouth per day for 3 days,2tabs per day for 3 days,1tab per day for 3 days  Current Outpatient Medications (Cardiovascular):  .  ezetimibe (ZETIA) 10 MG tablet, Take 1 tablet (10 mg total) by mouth daily. [name]  losartan (COZAAR) 25 MG tablet, Take 0.5 tablets (12.5 mg total) by mouth daily. [name]  torsemide (DEMADEX) 20 MG tablet, TAKE 2 TABLETS IN THE MORNING AND 1 IN THE EVENING. OK TO TAKE AN EXTRA DAILY AS NEEDED  FOR WEIGHT GAIN  Current Outpatient Medications (Respiratory):  .  albuterol (VENTOLIN HFA) 108 (90 Base) MCG/ACT inhaler, Inhale 2 puffs into the lungs every 4 (four) hours as needed. .  budesonide-formoterol (SYMBICORT) 160-4.5 MCG/ACT inhaler, Inhale 2 puffs into the lungs 2 (two) times daily. .  chlorpheniramine-HYDROcodone (TUSSIONEX PENNKINETIC ER) 10-8 MG/5ML SUER, Take 5 mLs by mouth every 12 (twelve) hours as needed for cough. .  fexofenadine (ALLEGRA) 180 MG tablet, Take 180 mg by mouth daily. .  fluticasone (FLONASE) 50 MCG/ACT nasal spray, Place 2 sprays into both nostrils daily. (Patient taking differently: Place 2 sprays into both nostrils daily as needed for  allergies.) .  guaiFENesin (MUCINEX) 600 MG 12 hr tablet, Take 2 tablets (1,200 mg total) by mouth 2 (two) times daily as needed for to loosen phlegm.  Current Outpatient Medications (Analgesics):  .  acetaminophen (TYLENOL) 650 MG CR tablet, Take by mouth. [name]  allopurinol (ZYLOPRIM) 100 MG tablet, Take 2 tablets (200 mg total) by mouth daily. Take 200 mg by mouth daily. [name]  aspirin 81 MG EC tablet, Take 81 mg by mouth daily. .  traMADol (ULTRAM) 50 MG tablet, Take 1 tablet (50 mg total) by mouth every 6 (six) hours as needed.   Current Outpatient Medications (Other):  [name]  buPROPion (WELLBUTRIN XL) 300 MG 24 hr tablet, Take 300 mg by mouth daily. .  Cholecalciferol 25 MCG (1000 UT) tablet, Take by mouth. .  clotrimazole-betamethasone (LOTRISONE) cream, Use as directed twice per day as needed .  cyclobenzaprine (FLEXERIL) 10 MG tablet, Take 10 mg by mouth at bedtime. .  Diclofenac Sodium 2 % SOLN, Place 2 g onto the skin 2 (two) times daily. .  fluconazole (DIFLUCAN) 150 MG tablet, TAKE 1 TABLET BY MOUTH EVERY 3 DAYS AS NEEDED .  gabapentin (NEURONTIN) 100 MG capsule, Take 1 capsule (100 mg total) by mouth 2 (two) times daily as needed. [name]  KLOR-CON M20 20 MEQ tablet, Take 1 tablet (20 mEq total) by mouth daily. OK TO TAKE AN  EXTRA ON DAYS YOU TAKE EXTRA TORSEMIDE .  Menthol-Methyl Salicylate (MUSCLE RUB) 10-15 % CREA, Apply 1 application topically as needed for muscle pain. .  Multiple Vitamin (MULTIVITAMIN) capsule, Take 1 capsule by mouth daily. .  naltrexone (DEPADE) 50 MG tablet, Take 1 tablet (50 mg total) by mouth daily. [name]  omeprazole (PRILOSEC) 20 MG capsule, Take 20 mg by mouth daily as needed (acid reflux).  [name]  PENNSAID 2 % SOLN, APPLY 2 PUMPS TOPICALLY TO THE AFFECTED AREA(S) TWICE DAILY AS DIRECTED .  valACYclovir (VALTREX) 1000 MG tablet, Take 500 mg by mouth daily as needed (cold sores).   Followup: Return if symptoms worsen or fail to improve.  Cathlean Cower, MD 02/01/2021 9:16 PM [facility]

## 2021-01-31 ENCOUNTER — Other Ambulatory Visit: Payer: Self-pay

## 2021-01-31 ENCOUNTER — Ambulatory Visit
Admission: RE | Admit: 2021-01-31 | Discharge: 2021-01-31 | Disposition: A | Discharge status: Home / Self Care | Payer: BC Managed Care – PPO | Source: Ambulatory Visit | Attending: Orthopaedic Surgery | Admitting: Orthopaedic Surgery

## 2021-01-31 DIAGNOSIS — M7989 Other specified soft tissue disorders: Secondary | ICD-10-CM | POA: Diagnosis not present

## 2021-01-31 DIAGNOSIS — M25562 Pain in left knee: Secondary | ICD-10-CM

## 2021-01-31 DIAGNOSIS — M25462 Effusion, left knee: Secondary | ICD-10-CM | POA: Diagnosis not present

## 2021-01-31 DIAGNOSIS — R600 Localized edema: Secondary | ICD-10-CM | POA: Diagnosis not present

## 2021-01-31 DIAGNOSIS — M1712 Unilateral primary osteoarthritis, left knee: Secondary | ICD-10-CM | POA: Diagnosis not present

## 2021-01-31 DIAGNOSIS — G8929 Other chronic pain: Secondary | ICD-10-CM

## 2021-02-01 ENCOUNTER — Encounter: Payer: Self-pay | Admitting: Internal Medicine

## 2021-02-01 NOTE — Assessment & Plan Note (Signed)
Etiology unclear, likely inflammatory - for lotrisone qd prn,  to f/u any worsening symptoms or concerns

## 2021-02-01 NOTE — Assessment & Plan Note (Signed)
Mild persistent, cont inhaler and for predpac asd repeat,  to f/u any worsening symptoms or concerns

## 2021-02-01 NOTE — Assessment & Plan Note (Signed)
Lab Results  Component Value Date   TSH 24.12 (H) 01/25/2021   Stable, pt to increase levothyroxine to 137 mg with f/u lab 4 wks  Current Outpatient Medications (Endocrine & Metabolic):  .  estradiol (VIVELLE-DOT) 0.05 MG/24HR patch, 1 patch 2 (two) times a week. .  levothyroxine (SYNTHROID) 137 MCG tablet, Take 1 tablet (137 mcg total) by mouth daily before breakfast. .  progesterone (PROMETRIUM) 100 MG capsule, Take 100 mg by mouth daily. .  predniSONE (DELTASONE) 10 MG tablet, 3 tabs by mouth per day for 3 days,2tabs per day for 3 days,1tab per day for 3 days  Current Outpatient Medications (Cardiovascular):  .  ezetimibe (ZETIA) 10 MG tablet, Take 1 tablet (10 mg total) by mouth daily. [name]  losartan (COZAAR) 25 MG tablet, Take 0.5 tablets (12.5 mg total) by mouth daily. [name]  torsemide (DEMADEX) 20 MG tablet, TAKE 2 TABLETS IN THE MORNING AND 1 IN THE EVENING. OK TO TAKE AN EXTRA DAILY AS NEEDED FOR WEIGHT GAIN  Current Outpatient Medications (Respiratory):  .  albuterol (VENTOLIN HFA) 108 (90 Base) MCG/ACT inhaler, Inhale 2 puffs into the lungs every 4 (four) hours as needed. .  budesonide-formoterol (SYMBICORT) 160-4.5 MCG/ACT inhaler, Inhale 2 puffs into the lungs 2 (two) times daily. .  chlorpheniramine-HYDROcodone (TUSSIONEX PENNKINETIC ER) 10-8 MG/5ML SUER, Take 5 mLs by mouth every 12 (twelve) hours as needed for cough. .  fexofenadine (ALLEGRA) 180 MG tablet, Take 180 mg by mouth daily. .  fluticasone (FLONASE) 50 MCG/ACT nasal spray, Place 2 sprays into both nostrils daily. (Patient taking differently: Place 2 sprays into both nostrils daily as needed for allergies.) .  guaiFENesin (MUCINEX) 600 MG 12 hr tablet, Take 2 tablets (1,200 mg total) by mouth 2 (two) times daily as needed for to loosen phlegm.  Current Outpatient Medications (Analgesics):  .  acetaminophen (TYLENOL) 650 MG CR tablet, Take by mouth. [name]  allopurinol (ZYLOPRIM) 100 MG tablet, Take 2 tablets (200 mg total) by  mouth daily. Take 200 mg by mouth daily. [name]  aspirin 81 MG EC tablet, Take 81 mg by mouth daily. .  traMADol (ULTRAM) 50 MG tablet, Take 1 tablet (50 mg total) by mouth every 6 (six) hours as needed.   Current Outpatient Medications (Other):  [name]  buPROPion (WELLBUTRIN XL) 300 MG 24 hr tablet, Take 300 mg by mouth daily. .  Cholecalciferol 25 MCG (1000 UT) tablet, Take by mouth. .  clotrimazole-betamethasone (LOTRISONE) cream, Use as directed twice per day as needed .  cyclobenzaprine (FLEXERIL) 10 MG tablet, Take 10 mg by mouth at bedtime. .  Diclofenac Sodium 2 % SOLN, Place 2 g onto the skin 2 (two) times daily. .  fluconazole (DIFLUCAN) 150 MG tablet, TAKE 1 TABLET BY MOUTH EVERY 3 DAYS AS NEEDED .  gabapentin (NEURONTIN) 100 MG capsule, Take 1 capsule (100 mg total) by mouth 2 (two) times daily as needed. [name]  KLOR-CON M20 20 MEQ tablet, Take 1 tablet (20 mEq total) by mouth daily. OK TO TAKE AN EXTRA ON DAYS YOU TAKE EXTRA TORSEMIDE .  Menthol-Methyl Salicylate (MUSCLE RUB) 10-15 % CREA, Apply 1 application topically as needed for muscle pain. .  Multiple Vitamin (MULTIVITAMIN) capsule, Take 1 capsule by mouth daily. .  naltrexone (DEPADE) 50 MG tablet, Take 1 tablet (50 mg total) by mouth daily. [name]  omeprazole (PRILOSEC) 20 MG capsule, Take 20 mg by mouth daily as needed (acid reflux).  [name]  PENNSAID 2 % SOLN, APPLY 2 PUMPS TOPICALLY  TO THE AFFECTED AREA(S) TWICE DAILY AS DIRECTED .  valACYclovir (VALTREX) 1000 MG tablet, Take 500 mg by mouth daily as needed (cold sores).

## 2021-02-01 NOTE — Patient Instructions (Signed)
Please take all new medication as prescribed 

## 2021-02-01 NOTE — Assessment & Plan Note (Signed)
Ok to add naloxone to wellbutrin,  to f/u any worsening symptoms or concerns

## 2021-02-03 ENCOUNTER — Encounter (HOSPITAL_BASED_OUTPATIENT_CLINIC_OR_DEPARTMENT_OTHER): Payer: Self-pay

## 2021-02-03 ENCOUNTER — Ambulatory Visit: Payer: BC Managed Care – PPO | Admitting: Orthopaedic Surgery

## 2021-02-03 ENCOUNTER — Telehealth: Payer: Self-pay

## 2021-02-03 ENCOUNTER — Encounter: Payer: Self-pay | Admitting: Orthopaedic Surgery

## 2021-02-03 DIAGNOSIS — M25562 Pain in left knee: Secondary | ICD-10-CM

## 2021-02-03 DIAGNOSIS — I5032 Chronic diastolic (congestive) heart failure: Secondary | ICD-10-CM | POA: Diagnosis not present

## 2021-02-03 DIAGNOSIS — M1712 Unilateral primary osteoarthritis, left knee: Secondary | ICD-10-CM | POA: Diagnosis not present

## 2021-02-03 DIAGNOSIS — Z6841 Body Mass Index (BMI) 40.0 and over, adult: Secondary | ICD-10-CM

## 2021-02-03 DIAGNOSIS — G8929 Other chronic pain: Secondary | ICD-10-CM | POA: Diagnosis not present

## 2021-02-03 LAB — BASIC METABOLIC PANEL
BUN/Creatinine Ratio: 14 (ref 12–28)
BUN: 22 mg/dL (ref 8–27)
CO2: 27 mmol/L (ref 20–29)
Calcium: 9.8 mg/dL (ref 8.7–10.3)
Chloride: 100 mmol/L (ref 96–106)
Creatinine, Ser: 1.52 mg/dL — ABNORMAL HIGH (ref 0.57–1.00)
Glucose: 61 mg/dL — ABNORMAL LOW (ref 65–99)
Potassium: 3.5 mmol/L (ref 3.5–5.2)
Sodium: 143 mmol/L (ref 134–144)
eGFR: 38 mL/min/{1.73_m2} — ABNORMAL LOW (ref >59)

## 2021-02-03 NOTE — Telephone Encounter (Signed)
Left knee gel injection ?

## 2021-02-03 NOTE — Progress Notes (Signed)
The patient comes in today to go over MRI of her left knee.  She is 65 years old.  She is well-known to me.  She has had a recurrent effusion of the left knee.  It is still swollen for her today and is painful.  Examination of the left knee does show posterior lateral pain and pain throughout the arc of motion with patellofemoral crepitation of the left knee.  There is a effusion.  I did place a steroid injection at the last visit.  MRI of her left knee shows that the meniscus and ligaments are all intact.  There is a small area of full-thickness cartilage loss with an osteochondral defect of the lateral femoral condyle and also a denuding of cartilage of the patellofemoral joint.  There is thinning of the cartilage and medial compartment.  At this point she really needs to work on weight loss.  Her last BMI was calculated at almost 45.  I will have her work on quad strengthening exercises as well.  She understands that she needs to lose weight to be able to qualify for knee replacement.  That would Neck surgical recommendation.  However, hyaluronic acid is not been tried for her.  She has failed conservative treatment including the failure of steroid injections.  She has been to weight loss clinic before as well.  I do feel the next appropriate step is ordering hyaluronic acid for her left knee.  If we get that approved we will see her back to aspirate her left knee and place a hyaluronic acid injection in that knee.  I still encouraged her to lose as much weight as she can.  All questions and concerns were answered and addressed.  Hopefully at her next visit will be to place hyaluronic acid into the left knee to treat the pain from osteoarthritis.

## 2021-02-05 LAB — HM DIABETES EYE EXAM

## 2021-02-08 NOTE — Telephone Encounter (Signed)
Noted  

## 2021-02-11 ENCOUNTER — Telehealth: Payer: Self-pay | Admitting: *Deleted

## 2021-02-11 NOTE — Telephone Encounter (Signed)
-----   Message from Skeet Latch, MD sent at 02/11/2021 12:01 PM EDT ----- Kidney function is slightly worse than it was when last tested 4 months ago.  This may be related to her losartan.  Recommend stopping it and switching to amlodipine 2.5mg .  Repeat BMP in 2-4 weeks.

## 2021-02-11 NOTE — Telephone Encounter (Signed)
Left message to call back  

## 2021-02-12 DIAGNOSIS — N1831 Chronic kidney disease, stage 3a: Secondary | ICD-10-CM | POA: Diagnosis not present

## 2021-02-16 ENCOUNTER — Ambulatory Visit: Payer: BC Managed Care – PPO | Admitting: Family Medicine

## 2021-02-16 ENCOUNTER — Telehealth: Payer: Self-pay | Admitting: Cardiovascular Disease

## 2021-02-16 DIAGNOSIS — N1832 Chronic kidney disease, stage 3b: Secondary | ICD-10-CM | POA: Diagnosis not present

## 2021-02-16 DIAGNOSIS — I5032 Chronic diastolic (congestive) heart failure: Secondary | ICD-10-CM | POA: Diagnosis not present

## 2021-02-16 DIAGNOSIS — E559 Vitamin D deficiency, unspecified: Secondary | ICD-10-CM | POA: Diagnosis not present

## 2021-02-16 MED ORDER — AMLODIPINE BESYLATE 2.5 MG PO TABS: 2.5000 mg | ORAL_TABLET | Freq: Every day | ORAL | 3 refills | Status: DC

## 2021-02-16 MED ORDER — KLOR-CON M20 20 MEQ PO TBCR: 20.0000 meq | EXTENDED_RELEASE_TABLET | Freq: Every day | ORAL | 3 refills | Status: DC

## 2021-02-16 MED ORDER — TORSEMIDE 20 MG PO TABS: ORAL_TABLET | ORAL | 3 refills | Status: DC

## 2021-02-16 NOTE — Telephone Encounter (Signed)
Patient is returning call. I made her aware Rip Harbour is off today, but she states her missed call was from today, but she is unsure what the call was regarding.

## 2021-02-16 NOTE — Telephone Encounter (Signed)
Spoke with pt, aware of dr Blenda Mounts recommendations. She reports she just left her kidney doctors office and they made changes in her potassium and torsemide dosage. Aware I am only able to call in refills for what we have in our chart. The person changing the medicine will need to call those in. Patient voiced understanding to stop the losartan and change to amlodipine. New script sent to the pharmacy

## 2021-02-16 NOTE — Telephone Encounter (Signed)
*  STAT* If patient is at the pharmacy, call can be transferred to refill team.   1. Which medications need to be refilled? (please list name of each medication and dose if known)  KLOR-CON M20 20 MEQ tablet  2. Which pharmacy/location (including street and city if local pharmacy) is medication to be sent to? [id number] - [place], [place] - [address]  3. Do they need a 30 day or 90 day supply?  90 day supply

## 2021-02-19 ENCOUNTER — Telehealth: Payer: Self-pay

## 2021-02-19 ENCOUNTER — Telehealth: Payer: Self-pay | Admitting: Orthopaedic Surgery

## 2021-02-19 NOTE — Telephone Encounter (Signed)
Talked with patient concerning gel injection.  

## 2021-02-19 NOTE — Telephone Encounter (Signed)
VOB has been submitted for SynviscOne, left knee. Pending BV. 

## 2021-02-19 NOTE — Telephone Encounter (Signed)
Patient is asking for a call back from April about submission of bilateral gel injections. Last submission for injections was in 2020. Please call patient about this matter at [phone].

## 2021-02-22 ENCOUNTER — Other Ambulatory Visit (INDEPENDENT_AMBULATORY_CARE_PROVIDER_SITE_OTHER): Payer: BC Managed Care – PPO

## 2021-02-22 ENCOUNTER — Telehealth: Payer: Self-pay

## 2021-02-22 ENCOUNTER — Encounter: Payer: Self-pay | Admitting: Internal Medicine

## 2021-02-22 DIAGNOSIS — E039 Hypothyroidism, unspecified: Secondary | ICD-10-CM | POA: Diagnosis not present

## 2021-02-22 LAB — TSH: TSH: 1.5 u[IU]/mL (ref 0.35–4.50)

## 2021-02-22 LAB — T4, FREE: Free T4: 1.25 ng/dL (ref 0.60–1.60)

## 2021-02-22 NOTE — Telephone Encounter (Signed)
PA required for SynviscOne, left knee. PA submitted online through Covermymeds LPA Pending# [id number]

## 2021-02-24 ENCOUNTER — Encounter: Payer: Self-pay | Admitting: Internal Medicine

## 2021-02-24 ENCOUNTER — Encounter: Payer: Self-pay | Admitting: Orthopaedic Surgery

## 2021-02-24 ENCOUNTER — Telehealth: Payer: Self-pay

## 2021-02-24 ENCOUNTER — Ambulatory Visit (INDEPENDENT_AMBULATORY_CARE_PROVIDER_SITE_OTHER): Payer: BC Managed Care – PPO | Admitting: Orthopaedic Surgery

## 2021-02-24 DIAGNOSIS — M1712 Unilateral primary osteoarthritis, left knee: Secondary | ICD-10-CM | POA: Diagnosis not present

## 2021-02-24 DIAGNOSIS — G8929 Other chronic pain: Secondary | ICD-10-CM

## 2021-02-24 DIAGNOSIS — M25561 Pain in right knee: Secondary | ICD-10-CM | POA: Diagnosis not present

## 2021-02-24 DIAGNOSIS — M1711 Unilateral primary osteoarthritis, right knee: Secondary | ICD-10-CM | POA: Diagnosis not present

## 2021-02-24 MED ORDER — METHYLPREDNISOLONE ACETATE 40 MG/ML IJ SUSP
40.0000 mg | INTRAMUSCULAR | Status: AC | PRN
  Administered 2021-02-24: 40 mg via INTRA_ARTICULAR

## 2021-02-24 MED ORDER — LIDOCAINE HCL 1 % IJ SOLN
3.0000 mL | INTRAMUSCULAR | Status: AC | PRN
  Administered 2021-02-24: 3 mL

## 2021-02-24 MED ORDER — HYLAN G-F 20 48 MG/6ML IX SOSY
48.0000 mg | PREFILLED_SYRINGE | INTRA_ARTICULAR | Status: AC | PRN
  Administered 2021-02-24: 48 mg via INTRA_ARTICULAR

## 2021-02-24 NOTE — Telephone Encounter (Signed)
Called and left a VM advising patient that she is approved for gel injection.  Approved for SynviscOne, left knee. [place] Patient is covered at 100% after Co-pay Co-pay of $100.00 required PA Approval# [id number] Valid 02/22/2021- 08/21/2021

## 2021-02-24 NOTE — Progress Notes (Signed)
Office Visit Note   Patient: Suzanne Hansen           Date of Birth: [birth year]           MRN: [id number] Visit Date: 02/24/2021              Requested by: Biagio Borg, MD [address] [place],  [place] [id number] PCP: Biagio Borg, MD   Assessment & Plan: Visit Diagnoses:  1. Unilateral primary osteoarthritis, left knee   2. Chronic pain of right knee     Plan: I did provide the Synvisc 1 injection in her left knee to treat the pain from osteoarthritis which he tolerated well.  Per the patient's request I did provide a steroid injection in her right knee joint today.  I do feel it is appropriate to order Synvisc 1 for right knee.  We will see if we get that ordered and see her in follow-up for that injection when it is ordered and approved.  This is being to treat pain from osteoarthritis in the right knee.  Should continue to work on quad strengthening exercises and weight loss.  Follow-Up Instructions: No follow-ups on file.   Orders:  No orders of the defined types were placed in this encounter.  No orders of the defined types were placed in this encounter.     Procedures: Large Joint Inj: R knee on 02/24/2021 4:21 PM Indications: diagnostic evaluation and pain Details: 22 G 1.5 in needle, superolateral approach  Arthrogram: No  Medications: 3 mL lidocaine 1 %; 40 mg methylPREDNISolone acetate 40 MG/ML Outcome: tolerated well, no immediate complications Procedure, treatment alternatives, risks and benefits explained, specific risks discussed. Consent was given by the patient. Immediately prior to procedure a time out was called to verify the correct patient, procedure, equipment, support staff and site/side marked as required. Patient was prepped and draped in the usual sterile fashion.   Large Joint Inj: L knee on 02/24/2021 4:21 PM Indications: pain and diagnostic evaluation Details: 22 G 1.5 in needle, superolateral approach  Arthrogram: No  Medications: 48 mg  Hylan 48 MG/6ML Outcome: tolerated well, no immediate complications Procedure, treatment alternatives, risks and benefits explained, specific risks discussed. Consent was given by the patient. Immediately prior to procedure a time out was called to verify the correct patient, procedure, equipment, support staff and site/side marked as required. Patient was prepped and draped in the usual sterile fashion.       Clinical Data: No additional findings.   Subjective: Chief Complaint  Patient presents with  . Left Knee - Follow-up  . Right Knee - Follow-up  The patient was originally scheduled today for just a Synvisc 1 injection that was already preordered for placement today and in no charge visit.  However, she did need me to see her for her right knee today because has been hurting quite a bit.  She has had previous x-rays of the right knee.  The left knee has had an MRI showing arthritic changes in the knee with no meniscal tear and synovitis.  A steroid injection is done well in the left knee but we felt that hyaluronic acid would be better as did she.  The right knee has been having some popping and global pain and tenderness.  She would like to have a steroid injection in the right knee today and hopefully have Synvisc 1 ordered for the right knee for the near future.  She continues to work on activity modification  and takes anti-inflammatories.  She is continue to work on weight loss.  She has had no other acute change in her medical status.  HPI  Review of Systems There is currently listed no headache, chest pain, shortness of breath, fever, chills, nausea, vomiting  Objective: Vital Signs: There were no vitals taken for this visit.  Physical Exam She is alert and orient x3 and in no acute distress Ortho Exam Examination of her right knee does show patellofemoral crepitation and medial lateral joint line tenderness but good range of motion.  The knee is ligamentously stable.  Both  knees hyperextend.  There is no effusion with her left knee. Specialty Comments:  No specialty comments available.  Imaging: No results found. Previous x-rays of the right knee are reviewed.  The medial lateral compartments are well-maintained but there is patellofemoral arthritic changes.  PMFS History: Patient Active Problem List   Diagnosis Date Noted  . Unilateral primary osteoarthritis, left knee 02/03/2021  . Statin myopathy 10/15/2020  . Gout 10/15/2020  . Abnormal cervical Papanicolaou smear 07/30/2020  . Memory difficulties 05/13/2020  . Chronic back pain 12/18/2019  . Yeast vaginitis 10/12/2019  . Overactive bladder 10/02/2019  . Spondylosis without myelopathy or radiculopathy, lumbar region 07/18/2019  . Essential (primary) hypertension 07/18/2019  . Effusion of left knee 05/31/2019  . Pain and swelling of left lower leg 05/31/2019  . Chronic low back pain 04/02/2019  . Left otitis media 12/17/2018  . At risk for diabetes mellitus 11/08/2018  . Pain in thoracic spine 11/08/2018  . Paresthesia of upper limb 11/08/2018  . Arthritis 10/29/2018  . Cervical radiculopathy 10/23/2018  . Osteopenia 09/28/2018  . Elevated blood uric acid level 09/28/2018  . Polyarthralgia 09/28/2018  . Paroxysmal atrial fibrillation ([place]) 09/18/2018  . OSA on CPAP 08/13/2018  . Stage 3 chronic kidney disease ([place]) 06/26/2018  . Rash 06/26/2018  . Wheezing 06/26/2018  . Left hip pain 05/27/2018  . Bicipital tendinitis of right shoulder 05/16/2018  . Bursitis of left hip 05/16/2018  . Chronic heart failure with preserved ejection fraction ([place]) 04/25/2018  . GERD (gastroesophageal reflux disease) 09/27/2017  . Acute shoulder bursitis, left 08/28/2017  . Lumbosacral radiculopathy at L4 06/14/2017  . 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 Easy bruising 03/29/2017  . Left leg paresthesias 03/29/2017  . Muscle cramping 03/20/2017  .  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 Pain in lower jaw 11/10/2016  . Cough 11/06/2016  . Vaginitis 09/17/2016  . Left wrist pain 09/16/2016  . Hypercoagulable state ([place]) 06/09/2016  . Iron deficiency 06/09/2016  . Synovial cyst of lumbar spine 06/09/2016  . Hypersomnia with sleep apnea 02/15/2016  . Extrinsic asthma 02/15/2016  . Ankle edema 02/15/2016  . Degenerative arthritis of knee, bilateral 01/21/2016  . Asthma with exacerbation 10/20/2015  . Acute sinus infection 09/29/2015  . Bilateral calf pain 08/31/2015  . Hyperlipidemia 08/28/2015  . Left knee pain 08/28/2015  . Varicose veins with pain 08/28/2015  . Peripheral edema 08/13/2015  . Injection site reaction 06/02/2015  . PHN (postherpetic neuralgia) 05/29/2015  . Shingles 06/10/2014  . Skin nodule 11/27/2012  . Encounter for well adult exam with abnormal findings 01/28/2011  . Prediabetes 01/28/2011  . Lipoma 10/22/2010  . Vitamin D deficiency 08/28/2009  . Hypothyroidism 05/09/2008  . Allergic rhinitis 05/09/2008  . HERPES ZOSTER W/NERVOUS COMPLICATION NEC 27/03/2375  . Hyperthyroidism 05/23/2007  . OBESITY 05/23/2007  . Anxiety state 05/23/2007  . Asthma 05/23/2007  .  Morbid obesity with BMI of 40.0-44.9, adult ([place]) 05/23/2007  . INSOMNIA, HX OF 05/23/2007   Past Medical History:  Diagnosis Date  . ALLERGIC RHINITIS 05/09/2008  . Allergy   . ANXIETY 05/23/2007  . ARM PAIN, LEFT 10/23/2009  . Arthritis   . Asthma   . ASTHMA 05/23/2007  . Back pain   . Cardiomyopathy ([place]) 09/08/2015  . Chest pain   . CHF (congestive heart failure) ([place])   . Chronic diastolic heart failure ([place]) 01/22/2020  . Constipation   . CONTACT DERMATITIS 06/09/2009  . Diabetes mellitus without complication ([place])    type II  . Dyspnea    allergies-dust, cig smoke, rag weeds  . ENDOMETRIOSIS NOS 05/23/2007  . FACIAL PAIN 06/02/2010  . Food allergy   . FREQUENCY, URINARY 05/17/2010  . GERD (gastroesophageal reflux disease)   .  GLUCOSE INTOLERANCE 08/28/2009  . HERPES SIMPLEX, UNCOMPLICATED 06/07/5620  . Hyperlipidemia 08/28/2015  . HYPERTHYROIDISM 05/23/2007  . Hypoactive thyroid   . HYPOTHYROIDISM 05/09/2008  . Impaired glucose tolerance 01/28/2011  . INSOMNIA, HX OF 05/23/2007  . Joint pain   . LATERAL EPICONDYLITIS, RIGHT 03/10/2009  . LIPOMA 10/22/2010  . NECK PAIN 11/10/2010  . OBESITY 05/23/2007  . Osteopenia 09/28/2018  . Plantar fasciitis    Both feet  . Pre-diabetes   . SHOULDER PAIN, LEFT 05/17/2010  . SINUSITIS- ACUTE-NOS 11/03/2008  . SKIN LESION 11/10/2010  . Sleep apnea   . Stage III chronic kidney disease ([place])    Stage III  . Unspecified Chest Pain 07/25/2008  . UNSPECIFIED URTICARIA 06/04/2009  . URI 08/28/2009  . UTI 04/29/2008  . VITAMIN D DEFICIENCY 08/28/2009  . Wheezing 07/12/2010    Family History  Problem Relation Age of Onset  . Diabetes Cousin   . Hypertension Cousin   . Heart disease Cousin   . Heart disease Mother        Rheumatic heart disease  . Depression Mother   . COPD Father   . Hypertension Father   . Cancer Maternal Grandmother        Breast  . Diabetes Sister   . Kidney disease Sister     Past Surgical History:  Procedure Laterality Date  . COLONOSCOPY    . ENDOMETRIAL ABLATION    . LAMINECTOMY  1996  . LAPAROTOMY     exploratory  . lower back surgery  07/06/2017   cyst  . POLYPECTOMY    . s/p endometrial ablation  12/06  . TUBAL LIGATION    . UTERINE FIBROID SURGERY    . WISDOM TOOTH EXTRACTION     Social History   Occupational History  . Occupation: SOCIAL WORKER    Employer: [employer]    Comment: [place]  Tobacco Use  . Smoking status: Former Smoker    Packs/day: 0.50    Years: 12.00    Pack years: 6.00    Types: Cigarettes    Quit date: 11/05/1987    Years since quitting: 33.3  . Smokeless tobacco: Never Used  Vaping Use  . Vaping Use: Never used  Substance and Sexual Activity  . Alcohol use: No    Alcohol/week: 1.0  standard drink    Types: 1 Glasses of wine per week    Comment: WINE  . Drug use: No  . Sexual activity: Not on file

## 2021-02-25 ENCOUNTER — Telehealth: Payer: Self-pay

## 2021-02-25 NOTE — Telephone Encounter (Signed)
Right knee synvisc one  

## 2021-02-26 DIAGNOSIS — M5412 Radiculopathy, cervical region: Secondary | ICD-10-CM | POA: Diagnosis not present

## 2021-03-02 ENCOUNTER — Telehealth (INDEPENDENT_AMBULATORY_CARE_PROVIDER_SITE_OTHER): Payer: BC Managed Care – PPO | Admitting: Family Medicine

## 2021-03-02 DIAGNOSIS — J111 Influenza due to unidentified influenza virus with other respiratory manifestations: Secondary | ICD-10-CM | POA: Diagnosis not present

## 2021-03-02 MED ORDER — OSELTAMIVIR PHOSPHATE 75 MG PO CAPS: 75.0000 mg | ORAL_CAPSULE | Freq: Two times a day (BID) | ORAL | 0 refills | Status: DC

## 2021-03-02 MED ORDER — PREDNISONE 20 MG PO TABS: 40.0000 mg | ORAL_TABLET | Freq: Every day | ORAL | 0 refills | Status: DC

## 2021-03-02 MED ORDER — BENZONATATE 100 MG PO CAPS: 100.0000 mg | ORAL_CAPSULE | Freq: Three times a day (TID) | ORAL | 0 refills | Status: DC | PRN

## 2021-03-02 NOTE — Patient Instructions (Signed)
-  I sent the medication(s) we discussed to your pharmacy: Meds ordered this encounter  Medications  . benzonatate (TESSALON PERLES) 100 MG capsule    Sig: Take 1 capsule (100 mg total) by mouth 3 (three) times daily as needed.    Dispense:  20 capsule    Refill:  0  . oseltamivir (TAMIFLU) 75 MG capsule    Sig: Take 1 capsule (75 mg total) by mouth 2 (two) times daily.    Dispense:  10 capsule    Refill:  0  . predniSONE (DELTASONE) 20 MG tablet    Sig: Take 2 tablets (40 mg total) by mouth daily with breakfast.    Dispense:  8 tablet    Refill:  0     I hope you are feeling better soon!  Seek in person care promptly if your symptoms worsen, new concerns arise or you are not improving with treatment.  It was nice to meet you today. I help [facility] out with telemedicine visits on Tuesdays and Thursdays and am available for visits on those days. If you have any concerns or questions following this visit please schedule a follow up visit with your Primary Care doctor or seek care at a local urgent care clinic to avoid delays in care.

## 2021-03-02 NOTE — Progress Notes (Signed)
Virtual Visit via Video Note  I connected with Suzanne Hansen  on 03/02/21 at  5:20 PM EDT by a video enabled telemedicine application and verified that I am speaking with the correct person using two identifiers.  Location patient: home, [place] Location provider:work or home office Persons participating in the virtual visit: patient, provider  I discussed the limitations of evaluation and management by telemedicine and the availability of in person appointments. The patient expressed understanding and agreed to proceed.   HPI:  Acute telemedicine visit for congestion and flu like symptoms: -Onset: 2 days -Symptoms include: nasal congestion, cough, chills, body aches, HA, wheezing - alb only helps a little -Denies: CP, SOB, NVD, inability to eat/drink/get out of bed -Has tried: albuterol -grandchild has been sick too and was just diagnosed with the flu -patient had COVID test which was negative -Pertinent past medical history:see below -Pertinent medication allergies:  Allergies  Allergen Reactions  . Shellfish Allergy Anaphylaxis  . Clarithromycin Nausea And Vomiting and Nausea Only  . Latex Itching and Other (See Comments)    ITCHING AND COLD SORES AROUND MOUTH WHEN LATEX GLOVES USED BY THE DENTIST/HYGENTIST  . Metronidazole Nausea And Vomiting and Nausea Only  . Zostavax [Zoster Vaccine Live]     Arm swelled  . Adhesive [Tape] Dermatitis    Plastic tape   . Amiloride     Other reaction(s): foot numbness  . Gadobenate   . Lisinopril Cough  . Other Dermatitis    Plastic tape   . Peanut Oil     Pt says it's ok   -COVID-19 vaccine status: fully vaccinated + booster;   ROS: See pertinent positives and negatives per HPI.  Past Medical History:  Diagnosis Date  . ALLERGIC RHINITIS 05/09/2008  . Allergy   . ANXIETY 05/23/2007  . ARM PAIN, LEFT 10/23/2009  . Arthritis   . Asthma   . ASTHMA 05/23/2007  . Back pain   . Cardiomyopathy ([place]) 09/08/2015  . Chest pain   . CHF  (congestive heart failure) ([place])   . Chronic diastolic heart failure ([place]) 01/22/2020  . Constipation   . CONTACT DERMATITIS 06/09/2009  . Diabetes mellitus without complication ([place])    type II  . Dyspnea    allergies-dust, cig smoke, rag weeds  . ENDOMETRIOSIS NOS 05/23/2007  . FACIAL PAIN 06/02/2010  . Food allergy   . FREQUENCY, URINARY 05/17/2010  . GERD (gastroesophageal reflux disease)   . GLUCOSE INTOLERANCE 08/28/2009  . HERPES SIMPLEX, UNCOMPLICATED 8/93/8101  . Hyperlipidemia 08/28/2015  . HYPERTHYROIDISM 05/23/2007  . Hypoactive thyroid   . HYPOTHYROIDISM 05/09/2008  . Impaired glucose tolerance 01/28/2011  . INSOMNIA, HX OF 05/23/2007  . Joint pain   . LATERAL EPICONDYLITIS, RIGHT 03/10/2009  . LIPOMA 10/22/2010  . NECK PAIN 11/10/2010  . OBESITY 05/23/2007  . Osteopenia 09/28/2018  . Plantar fasciitis    Both feet  . Pre-diabetes   . SHOULDER PAIN, LEFT 05/17/2010  . SINUSITIS- ACUTE-NOS 11/03/2008  . SKIN LESION 11/10/2010  . Sleep apnea   . Stage III chronic kidney disease ([place])    Stage III  . Unspecified Chest Pain 07/25/2008  . UNSPECIFIED URTICARIA 06/04/2009  . URI 08/28/2009  . UTI 04/29/2008  . VITAMIN D DEFICIENCY 08/28/2009  . Wheezing 07/12/2010    Past Surgical History:  Procedure Laterality Date  . COLONOSCOPY    . ENDOMETRIAL ABLATION    . LAMINECTOMY  1996  . LAPAROTOMY     exploratory  . lower back surgery  07/06/2017   cyst  . POLYPECTOMY    . s/p endometrial ablation  12/06  . TUBAL LIGATION    . UTERINE FIBROID SURGERY    . WISDOM TOOTH EXTRACTION       Current Outpatient Medications:  .  benzonatate (TESSALON PERLES) 100 MG capsule, Take 1 capsule (100 mg total) by mouth 3 (three) times daily as needed., Disp: 20 capsule, Rfl: 0 .  oseltamivir (TAMIFLU) 75 MG capsule, Take 1 capsule (75 mg total) by mouth 2 (two) times daily., Disp: 10 capsule, Rfl: 0 .  predniSONE (DELTASONE) 20 MG tablet, Take 2 tablets (40 mg total) by mouth daily with  breakfast., Disp: 8 tablet, Rfl: 0 .  acetaminophen (TYLENOL) 650 MG CR tablet, Take by mouth., Disp: , Rfl:  .  albuterol (VENTOLIN HFA) 108 (90 Base) MCG/ACT inhaler, Inhale 2 puffs into the lungs every 4 (four) hours as needed., Disp: 8 g, Rfl: 11 .  allopurinol (ZYLOPRIM) 100 MG tablet, Take 2 tablets (200 mg total) by mouth daily. Take 200 mg by mouth daily., Disp: 180 tablet, Rfl: 3 .  amLODipine (NORVASC) 2.5 MG tablet, Take 1 tablet (2.5 mg total) by mouth daily., Disp: 90 tablet, Rfl: 3 .  aspirin 81 MG EC tablet, Take 81 mg by mouth daily., Disp: , Rfl:  .  budesonide-formoterol (SYMBICORT) 160-4.5 MCG/ACT inhaler, Inhale 2 puffs into the lungs 2 (two) times daily., Disp: 10.2 g, Rfl: 11 .  buPROPion (WELLBUTRIN XL) 300 MG 24 hr tablet, Take 300 mg by mouth daily., Disp: , Rfl:  .  chlorpheniramine-HYDROcodone (TUSSIONEX PENNKINETIC ER) 10-8 MG/5ML SUER, Take 5 mLs by mouth every 12 (twelve) hours as needed for cough., Disp: 180 mL, Rfl: 0 .  Cholecalciferol 25 MCG (1000 UT) tablet, Take by mouth., Disp: , Rfl:  .  clotrimazole-betamethasone (LOTRISONE) cream, Use as directed twice per day as needed, Disp: 15 g, Rfl: 1 .  cyclobenzaprine (FLEXERIL) 10 MG tablet, Take 10 mg by mouth at bedtime., Disp: , Rfl:  .  Diclofenac Sodium 2 % SOLN, Place 2 g onto the skin 2 (two) times daily., Disp: 112 g, Rfl: 3 .  estradiol (VIVELLE-DOT) 0.05 MG/24HR patch, 1 patch 2 (two) times a week., Disp: , Rfl:  .  ezetimibe (ZETIA) 10 MG tablet, Take 1 tablet (10 mg total) by mouth daily., Disp: 90 tablet, Rfl: 3 .  fexofenadine (ALLEGRA) 180 MG tablet, Take 180 mg by mouth daily., Disp: , Rfl:  .  fluconazole (DIFLUCAN) 150 MG tablet, TAKE 1 TABLET BY MOUTH EVERY 3 DAYS AS NEEDED, Disp: 2 tablet, Rfl: 1 .  fluticasone (FLONASE) 50 MCG/ACT nasal spray, Place 2 sprays into both nostrils daily. (Patient taking differently: Place 2 sprays into both nostrils daily as needed for allergies.), Disp: 16 g, Rfl:  6 .  gabapentin (NEURONTIN) 100 MG capsule, Take 1 capsule (100 mg total) by mouth 2 (two) times daily as needed., Disp: 180 capsule, Rfl: 2 .  guaiFENesin (MUCINEX) 600 MG 12 hr tablet, Take 2 tablets (1,200 mg total) by mouth 2 (two) times daily as needed for to loosen phlegm., Disp: 60 tablet, Rfl: 1 .  KLOR-CON M20 20 MEQ tablet, Take 1 tablet (20 mEq total) by mouth daily. OK TO TAKE AN EXTRA ON DAYS YOU TAKE EXTRA TORSEMIDE, Disp: 180 tablet, Rfl: 3 .  levothyroxine (SYNTHROID) 137 MCG tablet, Take 1 tablet (137 mcg total) by mouth daily before breakfast., Disp: 90 tablet, Rfl: 3 .  Menthol-Methyl Salicylate (MUSCLE RUB)  10-15 % CREA, Apply 1 application topically as needed for muscle pain., Disp: , Rfl:  .  Multiple Vitamin (MULTIVITAMIN) capsule, Take 1 capsule by mouth daily., Disp: , Rfl:  .  naltrexone (DEPADE) 50 MG tablet, Take 1 tablet (50 mg total) by mouth daily., Disp: 90 tablet, Rfl: 1 .  omeprazole (PRILOSEC) 20 MG capsule, Take 20 mg by mouth daily as needed (acid reflux). , Disp: , Rfl:  .  PENNSAID 2 % SOLN, APPLY 2 PUMPS TOPICALLY TO THE AFFECTED AREA(S) TWICE DAILY AS DIRECTED, Disp: 112 g, Rfl: 2 .  progesterone (PROMETRIUM) 100 MG capsule, Take 100 mg by mouth daily., Disp: , Rfl:  .  torsemide (DEMADEX) 20 MG tablet, TAKE 2 TABLETS IN THE MORNING AND 1 IN THE EVENING. OK TO TAKE AN EXTRA DAILY AS NEEDED FOR WEIGHT GAIN, Disp: 270 tablet, Rfl: 3 .  traMADol (ULTRAM) 50 MG tablet, Take 1 tablet (50 mg total) by mouth every 6 (six) hours as needed., Disp: 30 tablet, Rfl: 0 .  valACYclovir (VALTREX) 1000 MG tablet, Take 500 mg by mouth daily as needed (cold sores). , Disp: , Rfl: 0  EXAM:  VITALS per patient if applicable:  GENERAL: alert, oriented, appears well and in no acute distress  HEENT: atraumatic, conjunttiva clear, no obvious abnormalities on inspection of external nose and ears  NECK: normal movements of the head and neck  LUNGS: on inspection no signs of  respiratory distress, breathing rate appears normal, no obvious gross SOB, gasping or wheezing  CV: no obvious cyanosis  MS: moves all visible extremities without noticeable abnormality  PSYCH/NEURO: pleasant and cooperative, no obvious depression or anxiety, speech and thought processing grossly intact  ASSESSMENT AND PLAN:  Discussed the following assessment and plan:  Influenza-like illness  -we discussed possible serious and likely etiologies, options for evaluation and workup, limitations of telemedicine visit vs in person visit, treatment, treatment risks and precautions. Pt prefers to treat via telemedicine empirically rather than in person at this moment. Given close exposure to flu case and flu like symptoms influenza highly likely. Discussed other possibilities as well. She reports a negative covid test. She opted for tamiflu, prednisone and tessalon. Potential complications and precautions discussed.  Work/School slipped offered:   declined Scheduled follow up with PCP offered: as needed Advised to seek prompt in person care if worsening, new symptoms arise, or if is not improving with treatment. Discussed options for inperson care if PCP office not available. Did let this patient know that I only do telemedicine on Tuesdays and Thursdays for [facility]. Advised to schedule follow up visit with PCP or [facility] if any further questions or concerns to avoid delays in care.   I discussed the assessment and treatment plan with the patient. The patient was provided an opportunity to ask questions and all were answered. The patient agreed with the plan and demonstrated an understanding of the instructions.     Suzanne Kern, DO

## 2021-03-02 NOTE — Telephone Encounter (Signed)
Noted  

## 2021-03-04 ENCOUNTER — Telehealth: Payer: Self-pay | Admitting: Internal Medicine

## 2021-03-04 MED ORDER — HYDROCOD POLST-CPM POLST ER 10-8 MG/5ML PO SUER: 5.0000 mL | Freq: Two times a day (BID) | ORAL | 0 refills | Status: DC | PRN

## 2021-03-04 NOTE — Telephone Encounter (Signed)
Patient called and was wondering if chlorpheniramine-HYDROcodone Suzanne Hansen [facility] ER) 10-8 MG/5ML SUER can be sent to [id number] - [place], [place]. She did a mychart visit with Dr. Colin [place] on 03-02-21. Please advise

## 2021-03-04 NOTE — Telephone Encounter (Signed)
Ok done this time

## 2021-03-12 ENCOUNTER — Encounter: Payer: Self-pay | Admitting: Internal Medicine

## 2021-03-12 ENCOUNTER — Ambulatory Visit: Payer: BC Managed Care – PPO | Admitting: Internal Medicine

## 2021-03-12 ENCOUNTER — Ambulatory Visit (INDEPENDENT_AMBULATORY_CARE_PROVIDER_SITE_OTHER): Payer: BC Managed Care – PPO

## 2021-03-12 ENCOUNTER — Other Ambulatory Visit: Payer: Self-pay

## 2021-03-12 VITALS — BP 118/74 | HR 78 | Temp 97.9°F | Ht 63.0 in | Wt 252.0 lb

## 2021-03-12 DIAGNOSIS — R7303 Prediabetes: Secondary | ICD-10-CM

## 2021-03-12 DIAGNOSIS — R059 Cough, unspecified: Secondary | ICD-10-CM | POA: Diagnosis not present

## 2021-03-12 DIAGNOSIS — R062 Wheezing: Secondary | ICD-10-CM

## 2021-03-12 MED ORDER — CEFDINIR 300 MG PO CAPS: 300.0000 mg | ORAL_CAPSULE | Freq: Two times a day (BID) | ORAL | 0 refills | Status: DC

## 2021-03-12 MED ORDER — METHYLPREDNISOLONE ACETATE 80 MG/ML IJ SUSP
80.0000 mg | Freq: Once | INTRAMUSCULAR | Status: AC
  Administered 2021-03-12: 80 mg via INTRAMUSCULAR

## 2021-03-12 MED ORDER — PREDNISONE 10 MG PO TABS: ORAL_TABLET | ORAL | 0 refills | Status: DC

## 2021-03-12 MED ORDER — HYDROCOD POLST-CPM POLST ER 10-8 MG/5ML PO SUER: 5.0000 mL | Freq: Two times a day (BID) | ORAL | 0 refills | Status: DC | PRN

## 2021-03-12 NOTE — Patient Instructions (Signed)
You had the steroid shot today  Please take all new medication as prescribed - the antibiotic, prednisone, cough medicine  Please continue all other medications as before, including the inhaler  Please have the pharmacy call with any other refills you may need.  Please continue your efforts at being more active, low cholesterol diet, and weight control.  Please keep your appointments with your specialists as you may have planned  Please go to the XRAY Department in the first floor for the x-ray testing  You will be contacted by phone if any changes need to be made immediately.  Otherwise, you will receive a letter about your results with an explanation, but please check with MyChart first.  Please remember to sign up for MyChart if you have not done so, as this will be important to you in the future with finding out test results, communicating by private email, and scheduling acute appointments online when needed.

## 2021-03-12 NOTE — Progress Notes (Signed)
Patient ID: Suzanne Hansen, female   DOB: 16-Mar-1956, 65 y.o.   MRN: [id number]        Chief Complaint: cough and wheezing ill since may 23       HPI:  Suzanne Hansen is a 65 y.o. female here with c/o feverish and body aches starting may 23 that just never seemed to get better, then gradually worsening scant prod cough, congestion, wheezing , hoarseness and mild sob.Suzanne Hansen  Has been covid neg x 2, last one may 31.  Family has been ill with flu like illness as well, but better, and she is getting worse.  Pt denies chest pain, orthopnea, PND, increased LE swelling, palpitations, dizziness or syncope.   Pt denies polydipsia, polyuria, or new focal neuro s/s.        Wt Readings from Last 3 Encounters:  03/12/21 252 lb (114.3 kg)  01/26/21 244 lb (110.7 kg)  01/19/21 252 lb (114.3 kg)   BP Readings from Last 3 Encounters:  03/12/21 118/74  01/26/21 120/76  01/13/21 110/70         Past Medical History:  Diagnosis Date  . ALLERGIC RHINITIS 05/09/2008  . Allergy   . ANXIETY 05/23/2007  . ARM PAIN, LEFT 10/23/2009  . Arthritis   . Asthma   . ASTHMA 05/23/2007  . Back pain   . Cardiomyopathy ([place]) 09/08/2015  . Chest pain   . CHF (congestive heart failure) ([place])   . Chronic diastolic heart failure ([place]) 01/22/2020  . Constipation   . CONTACT DERMATITIS 06/09/2009  . Diabetes mellitus without complication ([place])    type II  . Dyspnea    allergies-dust, cig smoke, rag weeds  . ENDOMETRIOSIS NOS 05/23/2007  . FACIAL PAIN 06/02/2010  . Food allergy   . FREQUENCY, URINARY 05/17/2010  . GERD (gastroesophageal reflux disease)   . GLUCOSE INTOLERANCE 08/28/2009  . HERPES SIMPLEX, UNCOMPLICATED 2/53/6644  . Hyperlipidemia 08/28/2015  . HYPERTHYROIDISM 05/23/2007  . Hypoactive thyroid   . HYPOTHYROIDISM 05/09/2008  . Impaired glucose tolerance 01/28/2011  . INSOMNIA, HX OF 05/23/2007  . Joint pain   . LATERAL EPICONDYLITIS, RIGHT 03/10/2009  . LIPOMA 10/22/2010  . NECK PAIN 11/10/2010  . OBESITY 05/23/2007   . Osteopenia 09/28/2018  . Plantar fasciitis    Both feet  . Pre-diabetes   . SHOULDER PAIN, LEFT 05/17/2010  . SINUSITIS- ACUTE-NOS 11/03/2008  . SKIN LESION 11/10/2010  . Sleep apnea   . Stage III chronic kidney disease ([place])    Stage III  . Unspecified Chest Pain 07/25/2008  . UNSPECIFIED URTICARIA 06/04/2009  . URI 08/28/2009  . UTI 04/29/2008  . VITAMIN D DEFICIENCY 08/28/2009  . Wheezing 07/12/2010   Past Surgical History:  Procedure Laterality Date  . COLONOSCOPY    . ENDOMETRIAL ABLATION    . LAMINECTOMY  1996  . LAPAROTOMY     exploratory  . lower back surgery  07/06/2017   cyst  . POLYPECTOMY    . s/p endometrial ablation  12/06  . TUBAL LIGATION    . UTERINE FIBROID SURGERY    . WISDOM TOOTH EXTRACTION      reports that she quit smoking about 33 years ago. Her smoking use included cigarettes. She has a 6.00 pack-year smoking history. She has never used smokeless tobacco. She reports that she does not drink alcohol and does not use drugs. family history includes COPD in her father; Cancer in her maternal grandmother; Depression in her mother; Diabetes in her cousin and sister; Heart disease  in her cousin and mother; Hypertension in her cousin and father; Kidney disease in her sister. Allergies  Allergen Reactions  . Shellfish Allergy Anaphylaxis  . Clarithromycin Nausea And Vomiting and Nausea Only  . Latex Itching and Other (See Comments)    ITCHING AND COLD SORES AROUND MOUTH WHEN LATEX GLOVES USED BY THE DENTIST/HYGENTIST  . Metronidazole Nausea And Vomiting and Nausea Only  . Zostavax [Zoster Vaccine Live]     Arm swelled  . Adhesive [Tape] Dermatitis    Plastic tape   . Amiloride     Other reaction(s): foot numbness  . Gadobenate   . Lisinopril Cough  . Other Dermatitis    Plastic tape   . Peanut Oil     Pt says it's ok   Current Outpatient Medications on File Prior to Visit  Medication Sig Dispense Refill  . acetaminophen (TYLENOL) 650 MG CR tablet  Take by mouth.    Suzanne Hansen albuterol (VENTOLIN HFA) 108 (90 Base) MCG/ACT inhaler Inhale 2 puffs into the lungs every 4 (four) hours as needed. 8 g 11  . allopurinol (ZYLOPRIM) 100 MG tablet Take 2 tablets (200 mg total) by mouth daily. Take 200 mg by mouth daily. 180 tablet 3  . amLODipine (NORVASC) 2.5 MG tablet Take 1 tablet (2.5 mg total) by mouth daily. 90 tablet 3  . aspirin 81 MG EC tablet Take 81 mg by mouth daily.    . benzonatate (TESSALON PERLES) 100 MG capsule Take 1 capsule (100 mg total) by mouth 3 (three) times daily as needed. 20 capsule 0  . budesonide-formoterol (SYMBICORT) 160-4.5 MCG/ACT inhaler Inhale 2 puffs into the lungs 2 (two) times daily. 10.2 g 11  . buPROPion (WELLBUTRIN XL) 300 MG 24 hr tablet Take 300 mg by mouth daily.    . Cholecalciferol 25 MCG (1000 UT) tablet Take by mouth.    . clotrimazole-betamethasone (LOTRISONE) cream Use as directed twice per day as needed 15 g 1  . cyclobenzaprine (FLEXERIL) 10 MG tablet Take 10 mg by mouth at bedtime.    . Diclofenac Sodium 2 % SOLN Place 2 g onto the skin 2 (two) times daily. 112 g 3  . estradiol (VIVELLE-DOT) 0.05 MG/24HR patch 1 patch 2 (two) times a week.    . ezetimibe (ZETIA) 10 MG tablet Take 1 tablet (10 mg total) by mouth daily. 90 tablet 3  . fexofenadine (ALLEGRA) 180 MG tablet Take 180 mg by mouth daily.    . fluconazole (DIFLUCAN) 150 MG tablet TAKE 1 TABLET BY MOUTH EVERY 3 DAYS AS NEEDED 2 tablet 1  . fluticasone (FLONASE) 50 MCG/ACT nasal spray Place 2 sprays into both nostrils daily. (Patient taking differently: Place 2 sprays into both nostrils daily as needed for allergies.) 16 g 6  . gabapentin (NEURONTIN) 100 MG capsule Take 1 capsule (100 mg total) by mouth 2 (two) times daily as needed. 180 capsule 2  . guaiFENesin (MUCINEX) 600 MG 12 hr tablet Take 2 tablets (1,200 mg total) by mouth 2 (two) times daily as needed for to loosen phlegm. 60 tablet 1  . KLOR-CON M20 20 MEQ tablet Take 1 tablet (20 mEq total)  by mouth daily. OK TO TAKE AN EXTRA ON DAYS YOU TAKE EXTRA TORSEMIDE 180 tablet 3  . levothyroxine (SYNTHROID) 137 MCG tablet Take 1 tablet (137 mcg total) by mouth daily before breakfast. 90 tablet 3  . Menthol-Methyl Salicylate (MUSCLE RUB) 10-15 % CREA Apply 1 application topically as needed for muscle pain.    Suzanne Hansen  Multiple Vitamin (MULTIVITAMIN) capsule Take 1 capsule by mouth daily.    . naltrexone (DEPADE) 50 MG tablet Take 1 tablet (50 mg total) by mouth daily. 90 tablet 1  . omeprazole (PRILOSEC) 20 MG capsule Take 20 mg by mouth daily as needed (acid reflux).     Suzanne Hansen oseltamivir (TAMIFLU) 75 MG capsule Take 1 capsule (75 mg total) by mouth 2 (two) times daily. 10 capsule 0  . PENNSAID 2 % SOLN APPLY 2 PUMPS TOPICALLY TO THE AFFECTED AREA(S) TWICE DAILY AS DIRECTED 112 g 2  . progesterone (PROMETRIUM) 100 MG capsule Take 100 mg by mouth daily.    Suzanne Hansen torsemide (DEMADEX) 20 MG tablet TAKE 2 TABLETS IN THE MORNING AND 1 IN THE EVENING. OK TO TAKE AN EXTRA DAILY AS NEEDED FOR WEIGHT GAIN 270 tablet 3  . traMADol (ULTRAM) 50 MG tablet Take 1 tablet (50 mg total) by mouth every 6 (six) hours as needed. 30 tablet 0  . valACYclovir (VALTREX) 1000 MG tablet Take 500 mg by mouth daily as needed (cold sores).   0   No current facility-administered medications on file prior to visit.        ROS:  All others reviewed and negative.  Objective        PE:  BP 118/74 (BP Location: Left Arm, Patient Position: Sitting, Cuff Size: Large)   Pulse 78   Temp 97.9 F (36.6 C) (Oral)   Ht 5\' 3"  (1.6 m)   Wt 252 lb (114.3 kg)   SpO2 99%   BMI 44.64 kg/m                 Constitutional: Pt appears in NAD  But mild ill               HENT: Head: NCAT.                Right Ear: External ear normal.                 Left Ear: External ear normal.                Eyes: . Pupils are equal, round, and reactive to light. Conjunctivae and EOM are normal; Bilat tm's with mild erythema.  Max sinus areas none tender.   Pharynx with mild erythema, no exudate               Nose: without d/c or deformity               Neck: Neck supple. Gross normal ROM               Cardiovascular: Normal rate and regular rhythm.                 Pulmonary/Chest: Effort normal and breath sounds decreased without rales but trace bilat wheezing.                Abd:  Soft, NT, ND, + BS, no organomegaly               Neurological: Pt is alert. At baseline orientation, motor grossly intact               Skin: Skin is warm. No rashes, no other new lesions, LE edema - none               Psychiatric: Pt behavior is normal without agitation   Micro: none  Cardiac tracings I have personally interpreted today:  none  Pertinent Radiological findings (summarize): none   Lab Results  Component Value Date   WBC 6.8 10/06/2020   HGB 13.4 10/06/2020   HCT 41.0 10/06/2020   PLT 287.0 10/06/2020   GLUCOSE 61 (L) 02/03/2021   CHOL 204 (H) 10/06/2020   TRIG 125.0 10/06/2020   HDL 72.50 10/06/2020   LDLCALC 107 (H) 10/06/2020   ALT 18 10/06/2020   AST 19 10/06/2020   NA 143 02/03/2021   K 3.5 02/03/2021   CL 100 02/03/2021   CREATININE 1.52 (H) 02/03/2021   BUN 22 02/03/2021   CO2 27 02/03/2021   TSH 1.50 02/22/2021   INR 1.0 03/21/2017   HGBA1C 5.5 10/06/2020   MICROALBUR <0.7 10/02/2019   Assessment/Plan:  Channelle Lehmkuhl is a 65 y.o. Black or African American [2] female with  has a past medical history of ALLERGIC RHINITIS (05/09/2008), Allergy, ANXIETY (05/23/2007), ARM PAIN, LEFT (10/23/2009), Arthritis, Asthma, ASTHMA (05/23/2007), Back pain, Cardiomyopathy ([place]) (09/08/2015), Chest pain, CHF (congestive heart failure) ([place]), Chronic diastolic heart failure ([place]) (01/22/2020), Constipation, CONTACT DERMATITIS (06/09/2009), Diabetes mellitus without complication ([place]), Dyspnea, ENDOMETRIOSIS NOS (05/23/2007), FACIAL PAIN (06/02/2010), Food allergy, FREQUENCY, URINARY (05/17/2010), GERD (gastroesophageal reflux disease), GLUCOSE  INTOLERANCE (08/28/2009), HERPES SIMPLEX, UNCOMPLICATED (11/06/1186), Hyperlipidemia (08/28/2015), HYPERTHYROIDISM (05/23/2007), Hypoactive thyroid, HYPOTHYROIDISM (05/09/2008), Impaired glucose tolerance (01/28/2011), INSOMNIA, HX OF (05/23/2007), Joint pain, LATERAL EPICONDYLITIS, RIGHT (03/10/2009), LIPOMA (10/22/2010), NECK PAIN (11/10/2010), OBESITY (05/23/2007), Osteopenia (09/28/2018), Plantar fasciitis, Pre-diabetes, SHOULDER PAIN, LEFT (05/17/2010), SINUSITIS- ACUTE-NOS (11/03/2008), SKIN LESION (11/10/2010), Sleep apnea, Stage III chronic kidney disease ([place]), Unspecified Chest Pain (07/25/2008), UNSPECIFIED URTICARIA (06/04/2009), URI (08/28/2009), UTI (04/29/2008), VITAMIN D DEFICIENCY (08/28/2009), and Wheezing (07/12/2010).  Cough Mild to mod, for antibx course,  Can r/o underlying pna - for cxr, to f/u any worsening symptoms or concerns  Wheezing Mild to mod, for depomedrol IM 80 predpac asd,,  to f/u any worsening symptoms or concerns  Prediabetes Lab Results  Component Value Date   HGBA1C 5.5 10/06/2020   Stable, pt to continue current medical treatment  - diet   Followup: Return if symptoms worsen or fail to improve.  Cathlean Cower, MD 03/13/2021 7:12 PM [facility]

## 2021-03-13 ENCOUNTER — Encounter: Payer: Self-pay | Admitting: Internal Medicine

## 2021-03-13 NOTE — Assessment & Plan Note (Signed)
Lab Results  Component Value Date   HGBA1C 5.5 10/06/2020   Stable, pt to continue current medical treatment  - diet

## 2021-03-13 NOTE — Assessment & Plan Note (Signed)
Mild to mod, for antibx course,  Can r/o underlying pna - for cxr, to f/u any worsening symptoms or concerns

## 2021-03-13 NOTE — Assessment & Plan Note (Signed)
Mild to mod, for depomedrol IM 80 predpac asd,,  to f/u any worsening symptoms or concerns

## 2021-03-22 ENCOUNTER — Telehealth: Payer: Self-pay

## 2021-03-22 NOTE — Telephone Encounter (Signed)
VOB has been submitted for SynviscOne, right knee. Pending BV.

## 2021-03-23 ENCOUNTER — Other Ambulatory Visit: Payer: Self-pay | Admitting: Internal Medicine

## 2021-03-23 MED ORDER — HYDROCODONE BIT-HOMATROP MBR 5-1.5 MG/5ML PO SOLN: 5.0000 mL | Freq: Four times a day (QID) | ORAL | 0 refills | Status: DC | PRN

## 2021-03-24 ENCOUNTER — Other Ambulatory Visit: Payer: Self-pay | Admitting: Internal Medicine

## 2021-03-24 MED ORDER — HYDROCODONE BIT-HOMATROP MBR 5-1.5 MG/5ML PO SOLN: 5.0000 mL | Freq: Four times a day (QID) | ORAL | 0 refills | Status: DC | PRN

## 2021-03-25 ENCOUNTER — Telehealth: Payer: Self-pay

## 2021-03-25 NOTE — Telephone Encounter (Signed)
Error

## 2021-03-25 NOTE — Telephone Encounter (Signed)
PA required for SynviscOne, right knee. PA submitted online through Covermymeds. Pending PA# [id number]

## 2021-03-30 ENCOUNTER — Telehealth: Payer: Self-pay

## 2021-03-30 NOTE — Telephone Encounter (Signed)
Yes, SynviscOne, right knee

## 2021-03-30 NOTE — Telephone Encounter (Signed)
Right knee gel injection approved? She's coming in tomorrow?

## 2021-03-31 ENCOUNTER — Encounter: Payer: Self-pay | Admitting: Orthopaedic Surgery

## 2021-03-31 ENCOUNTER — Ambulatory Visit: Payer: BC Managed Care – PPO | Admitting: Orthopaedic Surgery

## 2021-03-31 DIAGNOSIS — M1712 Unilateral primary osteoarthritis, left knee: Secondary | ICD-10-CM

## 2021-03-31 DIAGNOSIS — M1711 Unilateral primary osteoarthritis, right knee: Secondary | ICD-10-CM | POA: Diagnosis not present

## 2021-03-31 MED ORDER — LIDOCAINE HCL 1 % IJ SOLN
3.0000 mL | INTRAMUSCULAR | Status: AC | PRN
  Administered 2021-03-31: 3 mL

## 2021-03-31 MED ORDER — METHYLPREDNISOLONE ACETATE 40 MG/ML IJ SUSP
40.0000 mg | INTRAMUSCULAR | Status: AC | PRN
  Administered 2021-03-31: 40 mg via INTRA_ARTICULAR

## 2021-03-31 MED ORDER — HYLAN G-F 20 48 MG/6ML IX SOSY
48.0000 mg | PREFILLED_SYRINGE | INTRA_ARTICULAR | Status: AC | PRN
  Administered 2021-03-31: 48 mg via INTRA_ARTICULAR

## 2021-03-31 NOTE — Progress Notes (Signed)
   Procedure Note  Patient: Amamda Hansen             Date of Birth: 1956/09/03           MRN: [id number]             Visit Date: 03/31/2021 HPI: Mrs. Ernandez comes in today for bilateral knee pain.  She states that left knee is bothering her back of her knee.  She had a Synvisc 1 injection at her last visit.  She states that is helped some.  She clinic [place] next week and is wanting cortisone injection.  She is also been approved for right knee Synvisc 1 injection.  She has known osteoarthritis of both knees.  She had no new injury to either knee.  Has no scheduled knee replacements in the next 6 months.  Physical exam: Bilateral knees good range of motion.  No abnormal warmth erythema or effusion.  Bilateral knee patellofemoral crepitus. Procedures: Visit Diagnoses:  1. Unilateral primary osteoarthritis, left knee   2. Unilateral primary osteoarthritis, right knee     Large Joint Inj: L knee on 03/31/2021 10:53 AM Indications: pain Details: 22 G 1.5 in needle, anterolateral approach  Arthrogram: No  Medications: 3 mL lidocaine 1 %; 40 mg methylPREDNISolone acetate 40 MG/ML Outcome: tolerated well, no immediate complications Procedure, treatment alternatives, risks and benefits explained, specific risks discussed. Consent was given by the patient. Immediately prior to procedure a time out was called to verify the correct patient, procedure, equipment, support staff and site/side marked as required. Patient was prepped and draped in the usual sterile fashion.    Large Joint Inj: R knee on 03/31/2021 10:53 AM Indications: pain Details: 22 G 1.5 in needle, superolateral approach  Arthrogram: No  Medications: 48 mg Hylan 48 MG/6ML; 3 mL lidocaine 1 % Outcome: tolerated well, no immediate complications Procedure, treatment alternatives, risks and benefits explained, specific risks discussed. Consent was given by the patient. Immediately prior to procedure a time out was called to  verify the correct patient, procedure, equipment, support staff and site/side marked as required. Patient was prepped and draped in the usual sterile fashion.    Plan: She will follow-up with [place] as needed.  She knows to wait at least 6 months between supplemental injections and 3 months between cortisone injections.  Questions encouraged and answered

## 2021-04-01 ENCOUNTER — Telehealth: Payer: Self-pay

## 2021-04-01 NOTE — Telephone Encounter (Signed)
Approved for SynviscOne, right knee. Buy & Bill Covered at 100% after a Co-pay Co-pay of $50.00- $100.00 PA Approval# [id number] Valid 03/25/2021- 09/20/2021  Appt. 03/31/2021 with Dr. Ninfa Linden

## 2021-04-05 ENCOUNTER — Other Ambulatory Visit: Payer: Self-pay | Admitting: Internal Medicine

## 2021-04-05 MED ORDER — VALACYCLOVIR HCL 1 G PO TABS: 500.0000 mg | ORAL_TABLET | Freq: Every day | ORAL | 1 refills | Status: DC | PRN

## 2021-04-05 NOTE — Telephone Encounter (Signed)
  Patient requesting refill today, going out of town   Patient requesting  order for  valACYclovir (VALTREX) 1000 MG tablet  Pharmacy [id number] - [place], [place]

## 2021-04-27 ENCOUNTER — Ambulatory Visit: Payer: BC Managed Care – PPO | Admitting: Family Medicine

## 2021-04-28 ENCOUNTER — Other Ambulatory Visit: Payer: Self-pay | Admitting: Internal Medicine

## 2021-04-29 ENCOUNTER — Telehealth: Payer: Self-pay | Admitting: Internal Medicine

## 2021-04-29 DIAGNOSIS — I129 Hypertensive chronic kidney disease with stage 1 through stage 4 chronic kidney disease, or unspecified chronic kidney disease: Secondary | ICD-10-CM | POA: Diagnosis not present

## 2021-04-29 DIAGNOSIS — I5032 Chronic diastolic (congestive) heart failure: Secondary | ICD-10-CM | POA: Diagnosis not present

## 2021-04-29 DIAGNOSIS — Z8679 Personal history of other diseases of the circulatory system: Secondary | ICD-10-CM | POA: Diagnosis not present

## 2021-04-29 DIAGNOSIS — G4733 Obstructive sleep apnea (adult) (pediatric): Secondary | ICD-10-CM | POA: Diagnosis not present

## 2021-04-29 DIAGNOSIS — R7303 Prediabetes: Secondary | ICD-10-CM | POA: Diagnosis not present

## 2021-04-29 DIAGNOSIS — E039 Hypothyroidism, unspecified: Secondary | ICD-10-CM | POA: Diagnosis not present

## 2021-04-29 DIAGNOSIS — N183 Chronic kidney disease, stage 3 unspecified: Secondary | ICD-10-CM | POA: Diagnosis not present

## 2021-04-29 DIAGNOSIS — E559 Vitamin D deficiency, unspecified: Secondary | ICD-10-CM | POA: Diagnosis not present

## 2021-04-29 DIAGNOSIS — Z79899 Other long term (current) drug therapy: Secondary | ICD-10-CM | POA: Diagnosis not present

## 2021-04-29 DIAGNOSIS — I110 Hypertensive heart disease with heart failure: Secondary | ICD-10-CM | POA: Diagnosis not present

## 2021-04-29 MED ORDER — FLUCONAZOLE 150 MG PO TABS: ORAL_TABLET | ORAL | 1 refills | Status: DC

## 2021-04-29 NOTE — Telephone Encounter (Signed)
  Patient states after recently taking an antibiotic she has developed yeast Patient requesting order for Diflucan be called to pharmacy   Pharmacy [id number] - [place], [place]

## 2021-04-29 NOTE — Telephone Encounter (Signed)
See below

## 2021-04-29 NOTE — Telephone Encounter (Signed)
Ok done

## 2021-05-13 ENCOUNTER — Ambulatory Visit: Payer: BC Managed Care – PPO | Admitting: Family Medicine

## 2021-05-13 ENCOUNTER — Encounter: Payer: Self-pay | Admitting: Family Medicine

## 2021-05-13 ENCOUNTER — Other Ambulatory Visit: Payer: Self-pay

## 2021-05-13 VITALS — BP 144/90 | HR 79 | Ht 63.0 in | Wt 256.5 lb

## 2021-05-13 DIAGNOSIS — G4733 Obstructive sleep apnea (adult) (pediatric): Secondary | ICD-10-CM | POA: Diagnosis not present

## 2021-05-13 DIAGNOSIS — Z9989 Dependence on other enabling machines and devices: Secondary | ICD-10-CM | POA: Diagnosis not present

## 2021-05-13 NOTE — Progress Notes (Signed)
PATIENT: Suzanne Hansen DOB: [birth year]  REASON FOR VISIT: follow up HISTORY FROM: patient  Chief Complaint  Patient presents with   Obstructive Sleep Apnea    Rm 13, alone. Here for yearly CPAP f/u, pt reports doing well after having pressure changed.       HISTORY OF PRESENT ILLNESS: 05/13/2021 ALL: Suzanne Hansen returns for follow up for OSA on CPAP. She reports doing better on CPAP since reduced pressure settings. She is sleeping fairly well but admits that sleep schedule varies. She likes to read and can stay up til 3am. She naps when she gets home and then can't sleep at night. She feels that memory is the same. She remains employed full time and does well with performance evaluations.     05/13/20 CD: Suzanne Hansen is a 65 y.o. female here today for follow up for OSA on CPAP.  Epworth sleepiness scale: 6 / 24 , FSS at 28/63  Points.   Poor compliance with CPAP- due to insomnia - "I am up at 3 AM and sometimes that's the end of the night" And " When I read in bed I will always fall asleep- and I forget to put the CPAP on many evenings".   Spine fusion in December has reduced the pain. Suzanne Saintclair [place]sted. CPAP supplies not longer paid for by insurance. [employer]. She is now Counselling psychologist. Nasal ;pillow replacement.  05/13/2019 ALL:  Suzanne Hansen is a 65 y.o. female here today for follow up for OSA on CPAP.  She returns today for initial compliance report.  She is doing very well and notes improvement in energy and less fatigue during the day.  Compliance report dated 04/09/2019 through 05/08/2019 reveals that she is using CPAP every day for compliance of 100%.  28 out of the last 30 days she used her machine greater than 4 hours for compliance of 93%.  AHI was 2.8 on 16 cm of water and EPR of 3.  There was no significant leak.  She is doing very well today and without concerns.  HISTORY: (copied from Suzanne Hansen's note on 09/18/2018)  Suzanne Hansen is a 65 y.o. female , she was originally seen  in May 2017 seen here as a referral from Dentist Suzanne Hansen, Suzanne Hansen at [place].  I have the pleasure of seeing Suzanne Hansen today in a revisit, Almost 3 years after his encounter at her last.  Today is 18 September 2018 and Suzanne. Hansen reports that her cardiologist Suzanne. Delena Serve P. Sami, Suzanne Hansen at [facility] in [place] wants her to be reevaluated for sleep apnea.  The patient had been evaluated in an attended split-night polysomnography on 29 Feb 2016 at the time she had a baseline AHI of 17.3 mostly hypercapnia related, REM AHI was 52.6 she did not have oxygen desaturations of significance.  She was titrated to CPAP at 9 cmH2O the AHI became 0.0 at that setting and she tolerated supine positional sleep without further apneas, she also had a significant amount of REM sleep rebounding.   The patient started CPAP late May 2017 at home but she contracted frequent upper respiratory infections, sinusitis and she suffers from a easily congested nose and sinus symptoms.  She felt that CPAP was not comfortable for her to wear and I had never seen her after the trial of CPAP and she decided to go with a dental appliance instead.  Her daughter now tells her that the dental appliance has not eliminated her snoring,  as she could hear her mother snore.  Her heart failure specialist is also concerned that the dental appliance does nothing to treat the underlying REM dependent apnea, which is true.  Her family has noticed that she continues to have apneas while using the mouthpiece. The mouth piece cannot be adjusted ay further.    I have also the opportunity to read her cardiologist notes, Suzanne Hansen, according to the visit notes Suzanne. Hansen is described as a 65 year old female with obesity, with prior mild systolic heart failure but now normal ejection fraction after a right heart catheterization, paroxysmal atrial fibrillation.  Longstanding dyspnea and chest pain she has been on diuretics for several years.   Left ejection fraction had been 40 to 50% since 2014 or longer she is intolerant of lisinopril to the cough, she is not on anticoagulation for paroxysmal atrial fibrillation.  She is a borderline diabetic she is on statin for high lipids, she uses a mouth appliance for sleep apnea.  She was seen in November 2018 for chest pain had a negative stress test was given Lasix in the ED and had not taken her diuretics at home that day.  She received a steroid injection for hip pain, she had no improvement after a nerve ablation but finally found some improvement after the cyst was removed from the lumbar spine, she has an appointment with nephrology based on her cardiology referral.  Her white blood cell count had been 11.3 hemoglobin 12.4 hematocrit 35.9, AST 24, AST 20 sodium 139 potassium 3.8 in August 2019, creatinine was 1.75 which is elevated on May 17, 2018.  BUN was high at 29.   [facility]phrology visit at [facility] was in 3rd October 2019, the patient was started on allopurinol.      Suzanne. Hansen presents with Asthma, recurrent bronchitis, sinusitis,  hypothyroidism and weight gain ( BMI is now 56), and a  diagnosis of cardiomyopathy. Her cardiomyopathy progressed into heart failure, CKD stage 3 , and she developed leg edema. She knows she snores, because her throat is sore and her mouth is dry in AM, her daughters tell her she stops breathing and snores loudly.    Sleep habits are as follows: She usually returns home after work feeling almost ready to go to bed. She will be on her comfort close by 7 or 8 PM average bedtime is around 10 PM. She sleeps on one pillow, does not elevate the head of bed. She falls asleep on her side, but she wakes up she is often on her back. She usually falls asleep easily and she feels that she sleeps through the night some nights, others she will just wake up for no reason.  Sometimes she will gasp for air or seemingly snore herself awake,( mostly when falling asleep in a chair). She  will have three  bathroom breaks at night on average, rarely 4-5 . Wakes up spontaneously at about 6.00 AM but she needs to be up by 7.AM.  No naps in daytime- work is busy. She may doze when not physically active or mentally stimulated and she does often feel sleepier when she drives, helping herself to some caffeine on the way.   Sleep and additional medical history :As a child she used to sleep with a night light on, he sometimes eating a big light. She had some nightmares the usual pre-school or fear of monsters. She has no history of sleepwalking so of true night terrors , but of enuresis.  She had a  cyst removed from her spine 06-2017, and since her left leg bothers her "not as much".   Social history: She is divorced after 25 years of marriage, sleeps alone with her dog.  Adult  daughters, one married, one a Conservation officer, historic buildings, and the youngest one finishes her masters in education.  The youngest lives with her.  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 Caffeine - one cup in AM and rarely uses caffeine -  ETOH rare, less than one a week.  Tobacco use- in college  Sept 1977, and stopped with her first pregnancy - 5 cig a day.   REVIEW OF SYSTEMS: Out of a complete 14 system review of symptoms, the patient complains only of the following symptoms, memory loss, headache and all other reviewed systems are negative.  Epworth sleepiness scale: 9   ALLERGIES: Allergies  Allergen Reactions   Shellfish Allergy Anaphylaxis   Clarithromycin Nausea And Vomiting and Nausea Only   Latex Itching and Other (See Comments)    ITCHING AND COLD SORES AROUND MOUTH WHEN LATEX GLOVES USED BY THE DENTIST/HYGENTIST   Metronidazole Nausea And Vomiting and Nausea Only   Zostavax [Zoster Vaccine Live]     Arm swelled   Adhesive [Tape] Dermatitis    Plastic tape    Amiloride     Other reaction(s): foot numbness   Gadobenate    Lisinopril Cough   Other Dermatitis     Plastic tape    Peanut Oil     Pt says it's ok    HOME MEDICATIONS: Outpatient Medications Prior to Visit  Medication Sig Dispense Refill   acetaminophen (TYLENOL) 650 MG CR tablet Take by mouth.     albuterol (VENTOLIN HFA) 108 (90 Base) MCG/ACT inhaler Inhale 2 puffs into the lungs every 4 (four) hours as needed. 8 g 11   allopurinol (ZYLOPRIM) 100 MG tablet Take 2 tablets (200 mg total) by mouth daily. Take 200 mg by mouth daily. 180 tablet 3   aspirin 81 MG EC tablet Take 81 mg by mouth daily.     Cholecalciferol 25 MCG (1000 UT) tablet Take by mouth.     cyclobenzaprine (FLEXERIL) 10 MG tablet TAKE 1 TABLET(10 MG) BY MOUTH AT BEDTIME AS NEEDED FOR MUSCLE SPASMS 90 tablet 1   estradiol (VIVELLE-DOT) 0.05 MG/24HR patch 1 patch 2 (two) times a week.     ezetimibe (ZETIA) 10 MG tablet Take 1 tablet (10 mg total) by mouth daily. 90 tablet 3   fexofenadine (ALLEGRA) 180 MG tablet Take 180 mg by mouth daily.     fluconazole (DIFLUCAN) 150 MG tablet TAKE 1 TABLET BY MOUTH EVERY 3 DAYS AS NEEDED 2 tablet 1   fluticasone (FLONASE) 50 MCG/ACT nasal spray Place 2 sprays into both nostrils daily. (Patient taking differently: Place 2 sprays into both nostrils daily as needed for allergies.) 16 g 6   gabapentin (NEURONTIN) 100 MG capsule Take 1 capsule (100 mg total) by mouth 2 (two) times daily as needed. 180 capsule 2   guaiFENesin (MUCINEX) 600 MG 12 hr tablet Take 2 tablets (1,200 mg total) by mouth 2 (two) times daily as needed for to loosen phlegm. 60 tablet 1   KLOR-CON M20 20 MEQ tablet Take 1 tablet (20 mEq total) by mouth daily. OK TO TAKE AN EXTRA ON DAYS YOU TAKE EXTRA TORSEMIDE 180 tablet 3   levothyroxine (SYNTHROID) 137 MCG tablet Take 1 tablet (137 mcg total) by mouth daily before breakfast.  90 tablet 3   Menthol-Methyl Salicylate (MUSCLE RUB) 10-15 % CREA Apply 1 application topically as needed for muscle pain.     Multiple Vitamin (MULTIVITAMIN) capsule Take 1 capsule by mouth daily.      omeprazole (PRILOSEC) 20 MG capsule Take 20 mg by mouth daily as needed (acid reflux).      PENNSAID 2 % SOLN APPLY 2 PUMPS TOPICALLY TO THE AFFECTED AREA(S) TWICE DAILY AS DIRECTED 112 g 2   progesterone (PROMETRIUM) 100 MG capsule Take 100 mg by mouth daily.     torsemide (DEMADEX) 20 MG tablet TAKE 2 TABLETS IN THE MORNING AND 1 IN THE EVENING. OK TO TAKE AN EXTRA DAILY AS NEEDED FOR WEIGHT GAIN 270 tablet 3   traMADol (ULTRAM) 50 MG tablet Take 1 tablet (50 mg total) by mouth every 6 (six) hours as needed. 30 tablet 0   valACYclovir (VALTREX) 1000 MG tablet Take 0.5 tablets (500 mg total) by mouth daily as needed (cold sores). 60 tablet 1   amLODipine (NORVASC) 2.5 MG tablet Take 1 tablet (2.5 mg total) by mouth daily. 90 tablet 3   benzonatate (TESSALON PERLES) 100 MG capsule Take 1 capsule (100 mg total) by mouth 3 (three) times daily as needed. 20 capsule 0   budesonide-formoterol (SYMBICORT) 160-4.5 MCG/ACT inhaler Inhale 2 puffs into the lungs 2 (two) times daily. 10.2 g 11   buPROPion (WELLBUTRIN XL) 300 MG 24 hr tablet Take 300 mg by mouth daily.     cefdinir (OMNICEF) 300 MG capsule Take 1 capsule (300 mg total) by mouth 2 (two) times daily. 20 capsule 0   clotrimazole-betamethasone (LOTRISONE) cream Use as directed twice per day as needed 15 g 1   Diclofenac Sodium 2 % SOLN Place 2 g onto the skin 2 (two) times daily. 112 g 3   HYDROcodone bit-homatropine (HYCODAN) 5-1.5 MG/5ML syrup Take 5 mLs by mouth every 6 (six) hours as needed for cough. 180 mL 0   naltrexone (DEPADE) 50 MG tablet Take 1 tablet (50 mg total) by mouth daily. 90 tablet 1   oseltamivir (TAMIFLU) 75 MG capsule Take 1 capsule (75 mg total) by mouth 2 (two) times daily. 10 capsule 0   predniSONE (DELTASONE) 10 MG tablet 3 tabs by mouth per day for 3 days,2tabs per day for 3 days,1tab per day for 3 days 18 tablet 0   No facility-administered medications prior to visit.    PAST MEDICAL HISTORY: Past Medical  History:  Diagnosis Date   ALLERGIC RHINITIS 05/09/2008   Allergy    ANXIETY 05/23/2007   ARM PAIN, LEFT 10/23/2009   Arthritis    Asthma    ASTHMA 05/23/2007   Back pain    Cardiomyopathy ([place]) 09/08/2015   Chest pain    CHF (congestive heart failure) (HCC)    Chronic diastolic heart failure ([place]) 01/22/2020   Constipation    CONTACT DERMATITIS 06/09/2009   Diabetes mellitus without complication ([place])    type II   Dyspnea    allergies-dust, cig smoke, rag weeds   ENDOMETRIOSIS NOS 05/23/2007   FACIAL PAIN 06/02/2010   Food allergy    FREQUENCY, URINARY 05/17/2010   GERD (gastroesophageal reflux disease)    GLUCOSE INTOLERANCE 08/28/2009   HERPES SIMPLEX, UNCOMPLICATED [id number]   Hyperlipidemia 08/28/2015   HYPERTHYROIDISM 05/23/2007   Hypoactive thyroid    HYPOTHYROIDISM 05/09/2008   Impaired glucose tolerance 01/28/2011   INSOMNIA, HX OF 05/23/2007   Joint pain    LATERAL EPICONDYLITIS, RIGHT 03/10/2009  LIPOMA 10/22/2010   NECK PAIN 11/10/2010   OBESITY 05/23/2007   Osteopenia 09/28/2018   Plantar fasciitis    Both feet   Pre-diabetes    SHOULDER PAIN, LEFT 05/17/2010   SINUSITIS- ACUTE-NOS 11/03/2008   SKIN LESION 11/10/2010   Sleep apnea    Stage III chronic kidney disease ([place])    Stage III   Unspecified Chest Pain 07/25/2008   UNSPECIFIED URTICARIA 06/04/2009   URI 08/28/2009   UTI 04/29/2008   VITAMIN D DEFICIENCY 08/28/2009   Wheezing 07/12/2010    PAST SURGICAL HISTORY: Past Surgical History:  Procedure Laterality Date   COLONOSCOPY     ENDOMETRIAL ABLATION     LAMINECTOMY  1996   LAPAROTOMY     exploratory   lower back surgery  07/06/2017   cyst   POLYPECTOMY     s/p endometrial ablation  12/06   TUBAL LIGATION     UTERINE FIBROID SURGERY     WISDOM TOOTH EXTRACTION      FAMILY HISTORY: Family History  Problem Relation Age of Onset   Diabetes Cousin    Hypertension Cousin    Heart disease Cousin    Heart disease Mother        Rheumatic heart disease    Depression Mother    COPD Father    Hypertension Father    Cancer Maternal Grandmother        Breast   Diabetes Sister    Kidney disease Sister     SOCIAL HISTORY: Social History   Socioeconomic History   Marital status: Divorced    Spouse name: Not on file   Number of children: 3   Years of education: Not on file   Highest education level: Not on file  Occupational History   Occupation: SOCIAL WORKER    Employer: [employer]    Comment: [place]  Tobacco Use   Smoking status: Former    Packs/day: 0.50    Years: 12.00    Pack years: 6.00    Types: Cigarettes    Quit date: 11/05/1987    Years since quitting: 33.5   Smokeless tobacco: Never  Vaping Use   Vaping Use: Never used  Substance and Sexual Activity   Alcohol use: No    Alcohol/week: 1.0 standard drink    Types: 1 Glasses of wine per week    Comment: WINE   Drug use: No   Sexual activity: Not on file  Other Topics Concern   Not on file  Social History Narrative   Not on file   Social Determinants of Health   Financial Resource Strain: Not on file  Food Insecurity: Not on file  Transportation Needs: Not on file  Physical Activity: Not on file  Stress: Not on file  Social Connections: Not on file  Intimate Partner Violence: Not on file      PHYSICAL EXAM  Vitals:   05/13/21 1517  BP: (!) 144/90  Pulse: 79  Weight: 256 lb 8 oz (116.3 kg)  Height: '5\' 3"'$  (1.6 m)    Body mass index is 45.44 kg/m.  Generalized: Well developed, in no acute distress  Cardiology: normal rate and rhythm, no murmur noted Neurological examination  Mentation: Alert oriented to time, place, history taking. Follows all commands speech and language fluent Cranial nerve II-XII: Pupils were equal round reactive to light. Extraocular movements were full, visual field were full on confrontational test. Facial sensation and strength were normal. Uvula tongue midline. Head turning and  shoulder shrug  were  normal and symmetric. Motor: The motor testing reveals 5 over 5 strength of all 4 extremities. Good symmetric motor tone is noted throughout.  Coordination: Cerebellar testing reveals good finger-nose-finger and heel-to-shin bilaterally.  Gait and station: Gait is normal.   DIAGNOSTIC DATA (LABS, IMAGING, TESTING) - I reviewed patient records, labs, notes, testing and imaging myself where available.  No flowsheet data found.   Lab Results  Component Value Date   WBC 6.8 10/06/2020   HGB 13.4 10/06/2020   HCT 41.0 10/06/2020   MCV 91.4 10/06/2020   PLT 287.0 10/06/2020      Component Value Date/Time   NA 143 02/03/2021 1018   K 3.5 02/03/2021 1018   CL 100 02/03/2021 1018   CO2 27 02/03/2021 1018   GLUCOSE 61 (L) 02/03/2021 1018   GLUCOSE 130 (H) 10/06/2020 1057   BUN 22 02/03/2021 1018   CREATININE 1.52 (H) 02/03/2021 1018   CREATININE 0.97 09/17/2015 1113   CALCIUM 9.8 02/03/2021 1018   PROT 6.9 10/06/2020 1057   PROT 6.6 07/18/2019 0900   ALBUMIN 4.2 10/06/2020 1057   ALBUMIN 4.4 07/18/2019 0900   AST 19 10/06/2020 1057   ALT 18 10/06/2020 1057   ALKPHOS 96 10/06/2020 1057   BILITOT 0.4 10/06/2020 1057   BILITOT 0.4 07/18/2019 0900   GFRNONAA 38 (L) 09/03/2019 1335   GFRAA 44 (L) 09/03/2019 1335   Lab Results  Component Value Date   CHOL 204 (H) 10/06/2020   HDL 72.50 10/06/2020   LDLCALC 107 (H) 10/06/2020   TRIG 125.0 10/06/2020   CHOLHDL 3 10/06/2020   Lab Results  Component Value Date   HGBA1C 5.5 10/06/2020   Lab Results  Component Value Date   [id number] 10/02/2019   Lab Results  Component Value Date   TSH 1.50 02/22/2021    ASSESSMENT AND PLAN 65 y.o. year old female  has a past medical history of ALLERGIC RHINITIS (05/09/2008), Allergy, ANXIETY (05/23/2007), ARM PAIN, LEFT (10/23/2009), Arthritis, Asthma, ASTHMA (05/23/2007), Back pain, Cardiomyopathy ([place]) (09/08/2015), Chest pain, CHF (congestive heart failure) ([place]), Chronic diastolic heart  failure ([place]) (01/22/2020), Constipation, CONTACT DERMATITIS (06/09/2009), Diabetes mellitus without complication ([place]), Dyspnea, ENDOMETRIOSIS NOS (05/23/2007), FACIAL PAIN (06/02/2010), Food allergy, FREQUENCY, URINARY (05/17/2010), GERD (gastroesophageal reflux disease), GLUCOSE INTOLERANCE (08/28/2009), HERPES SIMPLEX, UNCOMPLICATED ([id number]), Hyperlipidemia (08/28/2015), HYPERTHYROIDISM (05/23/2007), Hypoactive thyroid, HYPOTHYROIDISM (05/09/2008), Impaired glucose tolerance (01/28/2011), INSOMNIA, HX OF (05/23/2007), Joint pain, LATERAL EPICONDYLITIS, RIGHT (03/10/2009), LIPOMA (10/22/2010), NECK PAIN (11/10/2010), OBESITY (05/23/2007), Osteopenia (09/28/2018), Plantar fasciitis, Pre-diabetes, SHOULDER PAIN, LEFT (05/17/2010), SINUSITIS- ACUTE-NOS (11/03/2008), SKIN LESION (11/10/2010), Sleep apnea, Stage III chronic kidney disease ([place]), Unspecified Chest Pain (07/25/2008), UNSPECIFIED URTICARIA (06/04/2009), URI (08/28/2009), UTI (04/29/2008), VITAMIN D DEFICIENCY (08/28/2009), and Wheezing (07/12/2010). here with     ICD-10-CM   1. OSA on CPAP  G47.33 For home use only DME continuous positive airway pressure (CPAP)   Z99.89        Ms. Hansen is doing very well with CPAP therapy.  She has noted significant improvement in her energy and less daytime sleepiness.  Compliance report reveals excellent daily and acceptable 4 hour compliance.  She was encouraged to continue using CPAP nightly and for greater than 4 hours each night. She will use memory compensation strategies. Grandmother and aunt had AD. May consider neuropsychology eval in future.  She will follow-up annually, sooner if needed.  She verbalizes understanding and agreement with this plan.   Orders Placed This Encounter  Procedures   For home  use only DME continuous positive airway pressure (CPAP)    Supplies    Order Specific Question:   Length of Need    Answer:   Lifetime    Order Specific Question:   Patient has OSA or probable OSA    Answer:   Yes     Order Specific Question:   Is the patient currently using CPAP in the home    Answer:   Yes    Order Specific Question:   Settings    Answer:   Other see comments    Order Specific Question:   CPAP supplies needed    Answer:   Mask, headgear, cushions, filters, heated tubing and water chamber      No orders of the defined types were placed in this encounter.     Debbora Presto, FNP-C 05/13/2021, 3:41 PM [facility] [address] [place], [place] [address] [phone]

## 2021-05-13 NOTE — Patient Instructions (Signed)
Please continue using your CPAP regularly. While your insurance requires that you use CPAP at least 4 hours each night on 70% of the nights, I recommend, that you not skip any nights and use it throughout the night if you can. Getting used to CPAP and staying with the treatment long term does take time and patience and discipline. Untreated obstructive sleep apnea when it is moderate to severe can have an adverse impact on cardiovascular health and raise her risk for heart disease, arrhythmias, hypertension, congestive heart failure, stroke and diabetes. Untreated obstructive sleep apnea causes sleep disruption, nonrestorative sleep, and sleep deprivation. This can have an impact on your day to day functioning and cause daytime sleepiness and impairment of cognitive function, memory loss, mood disturbance, and problems focussing. Using CPAP regularly can improve these symptoms.   Management of Memory Problems   There are some general things you can do to help manage your memory problems.  Your memory may not in fact recover, but by using techniques and strategies you will be able to manage your memory difficulties better.   1)  Establish a routine. Try to establish and then stick to a regular routine.  By doing this, you will get used to what to expect and you will reduce the need to rely on your memory.  Also, try to do things at the same time of day, such as taking your medication or checking your calendar first thing in the morning. Think about think that you can do as a part of a regular routine and make a list.  Then enter them into a daily planner to remind you.  This will help you establish a routine.   2)  Organize your environment. Organize your environment so that it is uncluttered.  Decrease visual stimulation.  Place everyday items such as keys or cell phone in the same place every day (ie.  Basket next to front door) Use post it notes with a brief message to yourself (ie. Turn off light,  lock the door) Use labels to indicate where things go (ie. Which cupboards are for food, dishes, etc.) Keep a notepad and pen by the telephone to take messages   3)  Memory Aids A diary or journal/notebook/daily planner Making a list (shopping list, chore list, to do list that needs to be done) Using an alarm as a reminder (kitchen timer or cell phone alarm) Using cell phone to store information (Notes, Calendar, Reminders) Calendar/White board placed in a prominent position Post-it notes   In order for memory aids to be useful, you need to have good habits.  It's no good remembering to make a note in your journal if you don't remember to look in it.  Try setting aside a certain time of day to look in journal.   4)  Improving mood and managing fatigue. There may be other factors that contribute to memory difficulties.  Factors, such as anxiety, depression and tiredness can affect memory. Regular gentle exercise can help improve your mood and give you more energy. Simple relaxation techniques may help relieve symptoms of anxiety Try to get back to completing activities or hobbies you enjoyed doing in the past. Learn to pace yourself through activities to decrease fatigue. Find out about some local support groups where you can share experiences with others. Try and achieve 7-8 hours of sleep at night.  

## 2021-05-16 NOTE — Telephone Encounter (Signed)
Patient requested addendum to visit note.

## 2021-05-17 NOTE — Progress Notes (Signed)
CM sent to [facility] 

## 2021-05-21 DIAGNOSIS — R252 Cramp and spasm: Secondary | ICD-10-CM | POA: Diagnosis not present

## 2021-05-21 DIAGNOSIS — Z5181 Encounter for therapeutic drug level monitoring: Secondary | ICD-10-CM | POA: Diagnosis not present

## 2021-05-21 DIAGNOSIS — Z79899 Other long term (current) drug therapy: Secondary | ICD-10-CM | POA: Diagnosis not present

## 2021-05-21 DIAGNOSIS — N1832 Chronic kidney disease, stage 3b: Secondary | ICD-10-CM | POA: Diagnosis not present

## 2021-05-21 DIAGNOSIS — I5042 Chronic combined systolic (congestive) and diastolic (congestive) heart failure: Secondary | ICD-10-CM | POA: Diagnosis not present

## 2021-05-21 DIAGNOSIS — I429 Cardiomyopathy, unspecified: Secondary | ICD-10-CM | POA: Diagnosis not present

## 2021-05-21 DIAGNOSIS — R9431 Abnormal electrocardiogram [ECG] [EKG]: Secondary | ICD-10-CM | POA: Diagnosis not present

## 2021-05-21 DIAGNOSIS — I130 Hypertensive heart and chronic kidney disease with heart failure and stage 1 through stage 4 chronic kidney disease, or unspecified chronic kidney disease: Secondary | ICD-10-CM | POA: Diagnosis not present

## 2021-05-24 ENCOUNTER — Ambulatory Visit (HOSPITAL_BASED_OUTPATIENT_CLINIC_OR_DEPARTMENT_OTHER): Payer: BC Managed Care – PPO | Admitting: Cardiovascular Disease

## 2021-05-24 DIAGNOSIS — N183 Chronic kidney disease, stage 3 unspecified: Secondary | ICD-10-CM | POA: Diagnosis not present

## 2021-05-31 ENCOUNTER — Other Ambulatory Visit: Payer: Self-pay

## 2021-05-31 ENCOUNTER — Telehealth: Payer: Self-pay | Admitting: Family Medicine

## 2021-05-31 ENCOUNTER — Ambulatory Visit (INDEPENDENT_AMBULATORY_CARE_PROVIDER_SITE_OTHER): Payer: BC Managed Care – PPO | Admitting: Physician Assistant

## 2021-05-31 ENCOUNTER — Encounter: Payer: Self-pay | Admitting: Physician Assistant

## 2021-05-31 DIAGNOSIS — M545 Low back pain, unspecified: Secondary | ICD-10-CM

## 2021-05-31 DIAGNOSIS — M1711 Unilateral primary osteoarthritis, right knee: Secondary | ICD-10-CM

## 2021-05-31 DIAGNOSIS — M1712 Unilateral primary osteoarthritis, left knee: Secondary | ICD-10-CM

## 2021-05-31 DIAGNOSIS — M170 Bilateral primary osteoarthritis of knee: Secondary | ICD-10-CM

## 2021-05-31 NOTE — Progress Notes (Signed)
HPI: Suzanne Hansen comes in today with bilateral knee pain.  She is requesting cortisone injections both knees today.  Last given a cortisone injection left knee on 03/31/2021.  She has had no known injury.  She states that the supplemental injection given to her right knee gave her some relief but she is having some pain in the right knee.  She has had no new injuries.  Last hemoglobin A1c was 5.7.  Discussed with patient that it is too early to do a cortisone injection again and left knee given the fact that she got minimal relief would not recommend repeat cortisone injection at this time.  Discussed with her that this can lead to worsening arthritis or even osteonecrosis of the knee joint.  Did offer her a cortisone injection in the right knee she states she is not having significant pain.  Discussed with her the need for weight loss.  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  However given her BMI she would not be a candidate for surgery at this point time.  She is going to the weight loss clinic in the near future and feel this will be beneficial for her.  Offered her formal physical therapy she states that she was seen somewhere in [facility] near [facility].  I prescription was written for formal physical therapy as discussed with patient.  However when my assistant tried to give her to her she deferred and stated that she would get her primary care physician to write for this.  We will see her as needed.  No charge for today's office visit.  Questions encouraged and answered at length.  Discussed with her that she could have a cortisone injection in the left knee in September around the 22nd.

## 2021-05-31 NOTE — Telephone Encounter (Signed)
Sent patient MyChart message. Referral placed.

## 2021-05-31 NOTE — Telephone Encounter (Signed)
Patient called asking if Dr Tamala Julian would be able to send a referral over for low back and bilateral knee physical therapy. She mentioned [facility] in [place].  Please advise.

## 2021-05-31 NOTE — Progress Notes (Unsigned)
referra

## 2021-06-03 ENCOUNTER — Telehealth: Payer: Self-pay | Admitting: Orthopaedic Surgery

## 2021-06-03 NOTE — Telephone Encounter (Signed)
Received call from patient. Going for 2nd opinion and needs copy of MRI report. I accepted verbal authorization. Mailed report to pt per her request.

## 2021-06-07 DIAGNOSIS — I429 Cardiomyopathy, unspecified: Secondary | ICD-10-CM | POA: Diagnosis not present

## 2021-06-07 DIAGNOSIS — Z6841 Body Mass Index (BMI) 40.0 and over, adult: Secondary | ICD-10-CM | POA: Diagnosis not present

## 2021-06-07 DIAGNOSIS — G4730 Sleep apnea, unspecified: Secondary | ICD-10-CM | POA: Diagnosis not present

## 2021-06-07 DIAGNOSIS — I5032 Chronic diastolic (congestive) heart failure: Secondary | ICD-10-CM | POA: Diagnosis not present

## 2021-06-07 DIAGNOSIS — R7303 Prediabetes: Secondary | ICD-10-CM | POA: Diagnosis not present

## 2021-06-07 DIAGNOSIS — M170 Bilateral primary osteoarthritis of knee: Secondary | ICD-10-CM | POA: Diagnosis not present

## 2021-06-07 DIAGNOSIS — N183 Chronic kidney disease, stage 3 unspecified: Secondary | ICD-10-CM | POA: Diagnosis not present

## 2021-06-07 DIAGNOSIS — J45909 Unspecified asthma, uncomplicated: Secondary | ICD-10-CM | POA: Diagnosis not present

## 2021-06-07 DIAGNOSIS — I10 Essential (primary) hypertension: Secondary | ICD-10-CM | POA: Diagnosis not present

## 2021-06-07 DIAGNOSIS — E785 Hyperlipidemia, unspecified: Secondary | ICD-10-CM | POA: Diagnosis not present

## 2021-06-13 ENCOUNTER — Other Ambulatory Visit: Payer: Self-pay | Admitting: Internal Medicine

## 2021-06-15 ENCOUNTER — Ambulatory Visit: Payer: BC Managed Care – PPO | Admitting: Family Medicine

## 2021-06-15 DIAGNOSIS — M170 Bilateral primary osteoarthritis of knee: Secondary | ICD-10-CM | POA: Diagnosis not present

## 2021-06-15 MED ORDER — TRAMADOL HCL 50 MG PO TABS: 50.0000 mg | ORAL_TABLET | Freq: Four times a day (QID) | ORAL | 0 refills | Status: DC | PRN

## 2021-06-15 NOTE — Progress Notes (Deleted)
   I, Peterson Lombard, LAT, ATC acting as a scribe for Lynne Leader, MD.  Suzanne Hansen is a 65 y.o. female who presents to [place] at [facility] today for continued bilat knee pain. Pt was last seen by Dr. Georgina Snell on 12/30/20 and was given a L knee steroid injection and taught HEP. Of note, pt saw Dr. Ninfa Linden on 01/19/21 who repeated the L knee steroid injection.  Additionally, Dr. Ninfa Linden provided pt a Synvisc 1 L knee injection on 02/24/21. Pt sent a MyChart message to Dr. Tamala Julian on 05/31/21 and a referral was placed to [facility]. Today, pt reports  Dx imaging: 01/31/21 L knee MRI  12/30/20 L knee XR  06/09/20 Bilat standing knee XR  07/21/19 L knee MRI  05/31/19 L knee XR   Pertinent review of systems: ***  Relevant historical information: ***   Exam:  There were no vitals taken for this visit. General: Well Developed, well nourished, and in no acute distress.   MSK: ***    Lab and Radiology Results No results found for this or any previous visit (from the past 72 hour(s)). No results found.     Assessment and Plan: 65 y.o. female with ***   PDMP not reviewed this encounter. No orders of the defined types were placed in this encounter.  No orders of the defined types were placed in this encounter.    Discussed warning signs or symptoms. Please see discharge instructions. Patient expresses understanding.   ***

## 2021-06-16 DIAGNOSIS — I5032 Chronic diastolic (congestive) heart failure: Secondary | ICD-10-CM | POA: Diagnosis not present

## 2021-06-16 DIAGNOSIS — I480 Paroxysmal atrial fibrillation: Secondary | ICD-10-CM | POA: Diagnosis not present

## 2021-06-16 DIAGNOSIS — M5412 Radiculopathy, cervical region: Secondary | ICD-10-CM | POA: Diagnosis not present

## 2021-06-16 DIAGNOSIS — N1831 Chronic kidney disease, stage 3a: Secondary | ICD-10-CM | POA: Diagnosis not present

## 2021-06-16 DIAGNOSIS — R600 Localized edema: Secondary | ICD-10-CM | POA: Diagnosis not present

## 2021-06-16 DIAGNOSIS — I5030 Unspecified diastolic (congestive) heart failure: Secondary | ICD-10-CM | POA: Diagnosis not present

## 2021-06-28 DIAGNOSIS — Z713 Dietary counseling and surveillance: Secondary | ICD-10-CM | POA: Diagnosis not present

## 2021-06-30 ENCOUNTER — Other Ambulatory Visit
Admission: RE | Admit: 2021-06-30 | Discharge: 2021-06-30 | Disposition: A | Discharge status: Home / Self Care | Payer: BC Managed Care – PPO | Source: Ambulatory Visit | Attending: Cardiology | Admitting: Cardiology

## 2021-06-30 DIAGNOSIS — Z6841 Body Mass Index (BMI) 40.0 and over, adult: Secondary | ICD-10-CM | POA: Diagnosis not present

## 2021-06-30 DIAGNOSIS — I5030 Unspecified diastolic (congestive) heart failure: Secondary | ICD-10-CM | POA: Insufficient documentation

## 2021-06-30 DIAGNOSIS — R600 Localized edema: Secondary | ICD-10-CM | POA: Diagnosis not present

## 2021-06-30 DIAGNOSIS — M170 Bilateral primary osteoarthritis of knee: Secondary | ICD-10-CM | POA: Diagnosis not present

## 2021-06-30 LAB — BRAIN NATRIURETIC PEPTIDE: B Natriuretic Peptide: 18.8 pg/mL (ref 0.0–100.0)

## 2021-07-01 DIAGNOSIS — K219 Gastro-esophageal reflux disease without esophagitis: Secondary | ICD-10-CM | POA: Diagnosis not present

## 2021-07-01 DIAGNOSIS — K449 Diaphragmatic hernia without obstruction or gangrene: Secondary | ICD-10-CM | POA: Diagnosis not present

## 2021-07-05 ENCOUNTER — Ambulatory Visit: Payer: BC Managed Care – PPO | Admitting: Family Medicine

## 2021-07-14 DIAGNOSIS — R635 Abnormal weight gain: Secondary | ICD-10-CM | POA: Diagnosis not present

## 2021-07-16 DIAGNOSIS — M5412 Radiculopathy, cervical region: Secondary | ICD-10-CM | POA: Diagnosis not present

## 2021-07-30 DIAGNOSIS — M519 Unspecified thoracic, thoracolumbar and lumbosacral intervertebral disc disorder: Secondary | ICD-10-CM | POA: Diagnosis not present

## 2021-07-30 DIAGNOSIS — G4733 Obstructive sleep apnea (adult) (pediatric): Secondary | ICD-10-CM | POA: Diagnosis not present

## 2021-07-30 DIAGNOSIS — Z6838 Body mass index (BMI) 38.0-38.9, adult: Secondary | ICD-10-CM | POA: Diagnosis not present

## 2021-07-30 DIAGNOSIS — E669 Obesity, unspecified: Secondary | ICD-10-CM | POA: Diagnosis not present

## 2021-08-09 DIAGNOSIS — Z1231 Encounter for screening mammogram for malignant neoplasm of breast: Secondary | ICD-10-CM | POA: Diagnosis not present

## 2021-08-13 ENCOUNTER — Ambulatory Visit: Payer: BC Managed Care – PPO | Admitting: Family Medicine

## 2021-08-16 DIAGNOSIS — M5412 Radiculopathy, cervical region: Secondary | ICD-10-CM | POA: Diagnosis not present

## 2021-08-18 DIAGNOSIS — Z803 Family history of malignant neoplasm of breast: Secondary | ICD-10-CM | POA: Diagnosis not present

## 2021-08-18 DIAGNOSIS — Z6841 Body Mass Index (BMI) 40.0 and over, adult: Secondary | ICD-10-CM | POA: Diagnosis not present

## 2021-08-18 DIAGNOSIS — Z86010 Personal history of colonic polyps: Secondary | ICD-10-CM | POA: Diagnosis not present

## 2021-08-18 DIAGNOSIS — Z01419 Encounter for gynecological examination (general) (routine) without abnormal findings: Secondary | ICD-10-CM | POA: Diagnosis not present

## 2021-08-18 DIAGNOSIS — E669 Obesity, unspecified: Secondary | ICD-10-CM | POA: Diagnosis not present

## 2021-08-18 DIAGNOSIS — Z23 Encounter for immunization: Secondary | ICD-10-CM | POA: Diagnosis not present

## 2021-08-18 DIAGNOSIS — Z8049 Family history of malignant neoplasm of other genital organs: Secondary | ICD-10-CM | POA: Diagnosis not present

## 2021-08-19 ENCOUNTER — Other Ambulatory Visit: Payer: Self-pay | Admitting: Internal Medicine

## 2021-08-19 ENCOUNTER — Encounter: Payer: Self-pay | Admitting: Internal Medicine

## 2021-08-19 ENCOUNTER — Other Ambulatory Visit: Payer: Self-pay

## 2021-08-19 ENCOUNTER — Ambulatory Visit: Payer: BC Managed Care – PPO | Admitting: Internal Medicine

## 2021-08-19 VITALS — BP 120/70 | HR 86 | Temp 98.0°F | Ht 63.0 in | Wt 254.0 lb

## 2021-08-19 DIAGNOSIS — G4733 Obstructive sleep apnea (adult) (pediatric): Secondary | ICD-10-CM | POA: Diagnosis not present

## 2021-08-19 DIAGNOSIS — Z9989 Dependence on other enabling machines and devices: Secondary | ICD-10-CM

## 2021-08-19 DIAGNOSIS — H6981 Other specified disorders of Eustachian tube, right ear: Secondary | ICD-10-CM

## 2021-08-19 DIAGNOSIS — Z6841 Body Mass Index (BMI) 40.0 and over, adult: Secondary | ICD-10-CM

## 2021-08-19 DIAGNOSIS — R062 Wheezing: Secondary | ICD-10-CM

## 2021-08-19 DIAGNOSIS — K219 Gastro-esophageal reflux disease without esophagitis: Secondary | ICD-10-CM

## 2021-08-19 MED ORDER — OMEPRAZOLE 40 MG PO CPDR: 40.0000 mg | DELAYED_RELEASE_CAPSULE | Freq: Every day | ORAL | 3 refills | Status: DC

## 2021-08-19 MED ORDER — BUDESONIDE-FORMOTEROL FUMARATE 160-4.5 MCG/ACT IN AERO: 2.0000 | INHALATION_SPRAY | Freq: Two times a day (BID) | RESPIRATORY_TRACT | 12 refills | Status: DC

## 2021-08-19 MED ORDER — PREDNISONE 10 MG PO TABS: ORAL_TABLET | ORAL | 0 refills | Status: DC

## 2021-08-19 MED ORDER — METHYLPREDNISOLONE ACETATE 80 MG/ML IJ SUSP
80.0000 mg | Freq: Once | INTRAMUSCULAR | Status: AC
  Administered 2021-08-19: 80 mg via INTRAMUSCULAR

## 2021-08-19 NOTE — Patient Instructions (Signed)
You had the steroid shot today  Please take all new medication as prescribed - the prednisone  Please continue all other medications as before - including to restart the acid reflux medication  Please have the pharmacy call with any other refills you may need.  Please continue your efforts at being more active, low cholesterol diet, and weight control.  Please keep your appointments with your specialists as you may have planned  You will be contacted regarding the referral for: [facility]

## 2021-08-19 NOTE — Progress Notes (Signed)
Patient ID: Suzanne Hansen, female   DOB: Oct 10, 1956, 65 y.o.   MRN: [id number]        Chief Complaint: follow up cough and wheezing       HPI:  Suzanne Hansen is a 65 y.o. female here with c/o possible exposure to multiple URI infections in her immediate community including rsv and flu in her family; was covid neg last pm at home, but c/o 2-3 wks onset worsening scant prod cough, wheezing, mild sob doe with increased albuterol use, also with worsening right ear fullness and pressure, but no fever, does not feel overly ill, and Pt denies chest pain, orthopnea, PND, increased LE swelling, palpitations, dizziness or syncope.   Has good compliance with symbicort, and uses her CPAP though uncomfortable at night.  Also has recent mild difficulty with worsening reflux, has run out of her PPI in the past month but Denies worsening abd pain, dysphagia, n/v, bowel change or blood.  Very difficult to lose wt, and process to have bariatric surgury is long, and concerned about a $5000 deductible she will be responsible for after jan 1.           Wt Readings from Last 3 Encounters:  08/19/21 254 lb (115.2 kg)  05/13/21 256 lb 8 oz (116.3 kg)  03/12/21 252 lb (114.3 kg)   BP Readings from Last 3 Encounters:  08/19/21 120/70  05/13/21 (!) 144/90  03/12/21 118/74         Past Medical History:  Diagnosis Date   ALLERGIC RHINITIS 05/09/2008   Allergy    ANXIETY 05/23/2007   ARM PAIN, LEFT 10/23/2009   Arthritis    Asthma    ASTHMA 05/23/2007   Back pain    Cardiomyopathy ([place]) 09/08/2015   Chest pain    CHF (congestive heart failure) (HCC)    Chronic diastolic heart failure ([place]) 01/22/2020   Constipation    CONTACT DERMATITIS 06/09/2009   Diabetes mellitus without complication ([place])    type II   Dyspnea    allergies-dust, cig smoke, rag weeds   ENDOMETRIOSIS NOS 05/23/2007   FACIAL PAIN 06/02/2010   Food allergy    FREQUENCY, URINARY 05/17/2010   GERD (gastroesophageal reflux disease)    GLUCOSE  INTOLERANCE 08/28/2009   HERPES SIMPLEX, UNCOMPLICATED 2/42/6834   Hyperlipidemia 08/28/2015   HYPERTHYROIDISM 05/23/2007   Hypoactive thyroid    HYPOTHYROIDISM 05/09/2008   Impaired glucose tolerance 01/28/2011   INSOMNIA, HX OF 05/23/2007   Joint pain    LATERAL EPICONDYLITIS, RIGHT 03/10/2009   LIPOMA 10/22/2010   NECK PAIN 11/10/2010   OBESITY 05/23/2007   Osteopenia 09/28/2018   Plantar fasciitis    Both feet   Pre-diabetes    SHOULDER PAIN, LEFT 05/17/2010   SINUSITIS- ACUTE-NOS 11/03/2008   SKIN LESION 11/10/2010   Sleep apnea    Stage III chronic kidney disease ([place])    Stage III   Unspecified Chest Pain 07/25/2008   UNSPECIFIED URTICARIA 06/04/2009   URI 08/28/2009   UTI 04/29/2008   VITAMIN D DEFICIENCY 08/28/2009   Wheezing 07/12/2010   Past Surgical History:  Procedure Laterality Date   COLONOSCOPY     ENDOMETRIAL ABLATION     LAMINECTOMY  1996   LAPAROTOMY     exploratory   lower back surgery  07/06/2017   cyst   POLYPECTOMY     s/p endometrial ablation  12/06   TUBAL LIGATION     UTERINE FIBROID SURGERY     WISDOM TOOTH EXTRACTION  reports that she quit smoking about 33 years ago. Her smoking use included cigarettes. She has a 6.00 pack-year smoking history. She has never used smokeless tobacco. She reports that she does not drink alcohol and does not use drugs. family history includes COPD in her father; Cancer in her maternal grandmother; Depression in her mother; Diabetes in her cousin and sister; Heart disease in her cousin and mother; Hypertension in her cousin and father; Kidney disease in her sister. Allergies  Allergen Reactions   Shellfish Allergy Anaphylaxis   Clarithromycin Nausea And Vomiting and Nausea Only   Latex Itching and Other (See Comments)    ITCHING AND COLD SORES AROUND MOUTH WHEN LATEX GLOVES USED BY THE DENTIST/HYGENTIST   Metronidazole Nausea And Vomiting and Nausea Only   Zostavax [Zoster Vaccine Live]     Arm swelled   Adhesive  [Tape] Dermatitis    Plastic tape    Amiloride     Other reaction(s): foot numbness   Gadobenate    Lisinopril Cough   Other Dermatitis    Plastic tape    Peanut Oil     Pt says it's ok   Current Outpatient Medications on File Prior to Visit  Medication Sig Dispense Refill   acetaminophen (TYLENOL) 650 MG CR tablet Take by mouth.     albuterol (VENTOLIN HFA) 108 (90 Base) MCG/ACT inhaler Inhale 2 puffs into the lungs every 4 (four) hours as needed. 8 g 11   allopurinol (ZYLOPRIM) 100 MG tablet Take 2 tablets (200 mg total) by mouth daily. Take 200 mg by mouth daily. 180 tablet 3   aspirin 81 MG EC tablet Take 81 mg by mouth daily.     Cholecalciferol 25 MCG (1000 UT) tablet Take by mouth.     cyclobenzaprine (FLEXERIL) 10 MG tablet TAKE 1 TABLET(10 MG) BY MOUTH AT BEDTIME AS NEEDED FOR MUSCLE SPASMS 90 tablet 1   estradiol (VIVELLE-DOT) 0.05 MG/24HR patch 1 patch 2 (two) times a week.     ezetimibe (ZETIA) 10 MG tablet Take 1 tablet (10 mg total) by mouth daily. 90 tablet 3   fexofenadine (ALLEGRA) 180 MG tablet Take 180 mg by mouth daily.     fluconazole (DIFLUCAN) 150 MG tablet TAKE 1 TABLET BY MOUTH EVERY 3 DAYS AS NEEDED 2 tablet 1   fluticasone (FLONASE) 50 MCG/ACT nasal spray Place 2 sprays into both nostrils daily. (Patient taking differently: Place 2 sprays into both nostrils daily as needed for allergies.) 16 g 6   gabapentin (NEURONTIN) 100 MG capsule Take 1 capsule (100 mg total) by mouth 2 (two) times daily as needed. 180 capsule 2   guaiFENesin (MUCINEX) 600 MG 12 hr tablet Take 2 tablets (1,200 mg total) by mouth 2 (two) times daily as needed for to loosen phlegm. 60 tablet 1   KLOR-CON M20 20 MEQ tablet Take 1 tablet (20 mEq total) by mouth daily. OK TO TAKE AN EXTRA ON DAYS YOU TAKE EXTRA TORSEMIDE 180 tablet 3   levothyroxine (SYNTHROID) 137 MCG tablet Take 1 tablet (137 mcg total) by mouth daily before breakfast. 90 tablet 3   Menthol-Methyl Salicylate (MUSCLE RUB) 10-15  % CREA Apply 1 application topically as needed for muscle pain.     Multiple Vitamin (MULTIVITAMIN) capsule Take 1 capsule by mouth daily.     omeprazole (PRILOSEC) 20 MG capsule Take 20 mg by mouth daily as needed (acid reflux).      PENNSAID 2 % SOLN APPLY 2 PUMPS TOPICALLY TO THE AFFECTED  AREA(S) TWICE DAILY AS DIRECTED 112 g 2   progesterone (PROMETRIUM) 100 MG capsule Take 100 mg by mouth daily.     torsemide (DEMADEX) 20 MG tablet TAKE 2 TABLETS IN THE MORNING AND 1 IN THE EVENING. OK TO TAKE AN EXTRA DAILY AS NEEDED FOR WEIGHT GAIN 270 tablet 3   traMADol (ULTRAM) 50 MG tablet Take 1 tablet (50 mg total) by mouth every 6 (six) hours as needed. 30 tablet 0   valACYclovir (VALTREX) 1000 MG tablet Take 0.5 tablets (500 mg total) by mouth daily as needed (cold sores). 60 tablet 1   No current facility-administered medications on file prior to visit.        ROS:  All others reviewed and negative.  Objective        PE:  BP 120/70 (BP Location: Left Arm, Patient Position: Sitting, Cuff Size: Large)   Pulse 86   Temp 98 F (36.7 C) (Oral)   Ht 5\' 3"  (1.6 m)   Wt 254 lb (115.2 kg)   SpO2 99%   BMI 44.99 kg/m                 Constitutional: Pt appears in NAD               HENT: Head: NCAT.                Right Ear: External ear normal.  Bilat tm's with mild erythema.  Max sinus areas non tender.  Pharynx with mild erythema, no exudate               Left Ear: External ear normal.                Eyes: . Pupils are equal, round, and reactive to light. Conjunctivae and EOM are normal               Nose: without d/c or deformity               Neck: Neck supple. Gross normal ROM               Cardiovascular: Normal rate and regular rhythm.                 Pulmonary/Chest: Effort normal and breath sounds decresaed without rales but few bilat trace wheezing.                Abd:  Soft, NT, ND, + BS, no organomegaly               Neurological: Pt is alert. At baseline orientation, motor grossly  intact               Skin: Skin is warm. No rashes, no other new lesions, LE edema - none               Psychiatric: Pt behavior is normal without agitation   Micro: none  Cardiac tracings I have personally interpreted today:  none  Pertinent Radiological findings (summarize): none   Lab Results  Component Value Date   WBC 6.8 10/06/2020   HGB 13.4 10/06/2020   HCT 41.0 10/06/2020   PLT 287.0 10/06/2020   GLUCOSE 61 (L) 02/03/2021   CHOL 204 (H) 10/06/2020   TRIG 125.0 10/06/2020   HDL 72.50 10/06/2020   LDLCALC 107 (H) 10/06/2020   ALT 18 10/06/2020   AST 19 10/06/2020   NA 143 02/03/2021   K 3.5 02/03/2021   CL 100 02/03/2021  CREATININE 1.52 (H) 02/03/2021   BUN 22 02/03/2021   CO2 27 02/03/2021   TSH 1.50 02/22/2021   INR 1.0 03/21/2017   HGBA1C 5.5 10/06/2020   MICROALBUR <0.7 10/02/2019   Assessment/Plan:  Suzanne Hansen is a 65 y.o. Black or African American [2] female with  has a past medical history of ALLERGIC RHINITIS (05/09/2008), Allergy, ANXIETY (05/23/2007), ARM PAIN, LEFT (10/23/2009), Arthritis, Asthma, ASTHMA (05/23/2007), Back pain, Cardiomyopathy ([place]) (09/08/2015), Chest pain, CHF (congestive heart failure) ([place]), Chronic diastolic heart failure ([place]) (01/22/2020), Constipation, CONTACT DERMATITIS (06/09/2009), Diabetes mellitus without complication ([place]), Dyspnea, ENDOMETRIOSIS NOS (05/23/2007), FACIAL PAIN (06/02/2010), Food allergy, FREQUENCY, URINARY (05/17/2010), GERD (gastroesophageal reflux disease), GLUCOSE INTOLERANCE (08/28/2009), HERPES SIMPLEX, UNCOMPLICATED (9/67/5916), Hyperlipidemia (08/28/2015), HYPERTHYROIDISM (05/23/2007), Hypoactive thyroid, HYPOTHYROIDISM (05/09/2008), Impaired glucose tolerance (01/28/2011), INSOMNIA, HX OF (05/23/2007), Joint pain, LATERAL EPICONDYLITIS, RIGHT (03/10/2009), LIPOMA (10/22/2010), NECK PAIN (11/10/2010), OBESITY (05/23/2007), Osteopenia (09/28/2018), Plantar fasciitis, Pre-diabetes, SHOULDER PAIN, LEFT (05/17/2010), SINUSITIS-  ACUTE-NOS (11/03/2008), SKIN LESION (11/10/2010), Sleep apnea, Stage III chronic kidney disease ([place]), Unspecified Chest Pain (07/25/2008), UNSPECIFIED URTICARIA (06/04/2009), URI (08/28/2009), UTI (04/29/2008), VITAMIN D DEFICIENCY (08/28/2009), and Wheezing (07/12/2010).  Wheezing Exam c/w mild asthma exacerbation despite good med compliance; though quite a bit of infectious exposure per pt, I suspect no infection present currently as afeb, does not o/w feel ill, covid neg at home last pm;  Ok for depomedrol im 80, predpac asd, and contd albuterol hfa prn, symbicort .   Acute dysfunction of right eustachian tube Also for otc mucinex bid prn,  to f/u any worsening symptoms or concerns  GERD (gastroesophageal reflux disease) Mild recurrence, will need restart PPI, and antreflux precautions, wt loss  OSA on CPAP Sable overall, cont cpap   OBESITY Ok for referral to ambulatory wt loss management- ? monjouro candidate  Followup: Return in about 2 months (around 10/19/2021).  Cathlean Cower, MD 08/20/2021 4:33 AM [facility]

## 2021-08-20 ENCOUNTER — Encounter: Payer: Self-pay | Admitting: Internal Medicine

## 2021-08-20 DIAGNOSIS — H6981 Other specified disorders of Eustachian tube, right ear: Secondary | ICD-10-CM | POA: Insufficient documentation

## 2021-08-20 DIAGNOSIS — H6991 Unspecified Eustachian tube disorder, right ear: Secondary | ICD-10-CM | POA: Insufficient documentation

## 2021-08-20 NOTE — Assessment & Plan Note (Signed)
Sable overall, cont cpap

## 2021-08-20 NOTE — Assessment & Plan Note (Signed)
Mild recurrence, will need restart PPI, and antreflux precautions, wt loss

## 2021-08-20 NOTE — Assessment & Plan Note (Signed)
Also for otc mucinex bid prn,  to f/u any worsening symptoms or concerns

## 2021-08-20 NOTE — Assessment & Plan Note (Signed)
[place] for referral to ambulatory wt loss management- ? monjouro candidate

## 2021-08-20 NOTE — Assessment & Plan Note (Signed)
Exam c/w mild asthma exacerbation despite good med compliance; though quite a bit of infectious exposure per pt, I suspect no infection present currently as afeb, does not o/w feel ill, covid neg at home last pm;  Ok for depomedrol im 80, predpac asd, and contd albuterol hfa prn, symbicort .

## 2021-08-25 ENCOUNTER — Ambulatory Visit: Payer: BC Managed Care – PPO

## 2021-09-06 DIAGNOSIS — Z0289 Encounter for other administrative examinations: Secondary | ICD-10-CM

## 2021-09-08 ENCOUNTER — Telehealth: Payer: Self-pay | Admitting: Internal Medicine

## 2021-09-08 MED ORDER — PREDNISONE 10 MG PO TABS: ORAL_TABLET | ORAL | 0 refills | Status: DC

## 2021-09-08 MED ORDER — AZITHROMYCIN 250 MG PO TABS: ORAL_TABLET | ORAL | 0 refills | Status: AC

## 2021-09-08 NOTE — Telephone Encounter (Signed)
Spoke with patient and she states that she is having wheezing and coughing. Offered patient appt with another provider because Dr. Jenny Reichmann did not have any openings until next week. Patient declined and states that she will wait until next week to be seen.

## 2021-09-08 NOTE — Telephone Encounter (Signed)
Patient calling in  Patient has recent OV 11.10.22   Patient says since visit she has started wheezing again & has now developed a cough  Wants to know if provider can send another rx for prednisone & possibly an antibiotic  Pharmacy  [id number] - [place], [place]  Phone:  [phone] Fax:  [phone]

## 2021-09-08 NOTE — Telephone Encounter (Signed)
Ok this time - zpack and prednisone sent to [facility]

## 2021-09-09 NOTE — Telephone Encounter (Signed)
Patient notified

## 2021-09-13 ENCOUNTER — Ambulatory Visit: Payer: BC Managed Care – PPO | Admitting: Family Medicine

## 2021-09-14 ENCOUNTER — Encounter (HOSPITAL_BASED_OUTPATIENT_CLINIC_OR_DEPARTMENT_OTHER): Payer: Self-pay | Admitting: *Deleted

## 2021-09-15 ENCOUNTER — Ambulatory Visit (INDEPENDENT_AMBULATORY_CARE_PROVIDER_SITE_OTHER): Payer: BC Managed Care – PPO | Admitting: Cardiovascular Disease

## 2021-09-15 ENCOUNTER — Encounter (HOSPITAL_BASED_OUTPATIENT_CLINIC_OR_DEPARTMENT_OTHER): Payer: Self-pay | Admitting: Cardiovascular Disease

## 2021-09-15 ENCOUNTER — Ambulatory Visit: Payer: BC Managed Care – PPO | Admitting: Internal Medicine

## 2021-09-15 ENCOUNTER — Other Ambulatory Visit: Payer: Self-pay

## 2021-09-15 VITALS — BP 114/72 | HR 90 | Ht 63.0 in | Wt 256.2 lb

## 2021-09-15 DIAGNOSIS — I10 Essential (primary) hypertension: Secondary | ICD-10-CM

## 2021-09-15 DIAGNOSIS — I480 Paroxysmal atrial fibrillation: Secondary | ICD-10-CM

## 2021-09-15 DIAGNOSIS — G4733 Obstructive sleep apnea (adult) (pediatric): Secondary | ICD-10-CM

## 2021-09-15 DIAGNOSIS — I5032 Chronic diastolic (congestive) heart failure: Secondary | ICD-10-CM

## 2021-09-15 DIAGNOSIS — Z9989 Dependence on other enabling machines and devices: Secondary | ICD-10-CM

## 2021-09-15 DIAGNOSIS — Z5181 Encounter for therapeutic drug level monitoring: Secondary | ICD-10-CM

## 2021-09-15 DIAGNOSIS — J019 Acute sinusitis, unspecified: Secondary | ICD-10-CM

## 2021-09-15 NOTE — Telephone Encounter (Signed)
Patient seen in clinic

## 2021-09-15 NOTE — Assessment & Plan Note (Signed)
Continue CPAP.  

## 2021-09-15 NOTE — Assessment & Plan Note (Signed)
She is currently been treated with antibiotics and steroids.

## 2021-09-15 NOTE — Assessment & Plan Note (Addendum)
It is unclear when this occurred. She remembers having palpitations in the past when she was under a lot of stress but never had a DCCV or was on anticoagulation.  Continue to monitor for now. No atrial fibrillation noted on prior ECGs.

## 2021-09-15 NOTE — Assessment & Plan Note (Addendum)
LVEF previously reduced but has normalized.  She had an echo that [facility] recently that again revealed normal systolic function and no significant volume overload.  I think that her lower extremity edema is more related to venous insufficiency than heart failure.  Her renal function has also been quite stable.  She notes that she does well when she stops her diuretics.  She will get some compression stockings.  Right now she is also on steroids which is also contributing to her swelling.  She will continue taking torsemide 60 mg in the morning and 40 in the evenings while on steroids.  Continue potassium at 40 mEq twice daily.  Once she stops her prednisone she will switch to 60 mg of torsemide daily and potassium 20 mEq twice daily.  Check a BMP and a BNP next week.

## 2021-09-15 NOTE — Progress Notes (Signed)
Cardiology Office Note   Date:  09/15/2021   ID:  Suzanne, Hansen 1956/01/22, MRN [id number]  PCP:  Biagio Borg, MD  Cardiologist:   Skeet Latch, MD   No chief complaint on file.    History of Present Illness: Suzanne Hansen is a 65 y.o. female with hx of paroxysmal atrial fibrillation, chronic systolic and diastolic heart failure (LVEF recovered), CKD III, OSA, diabetes, prior tobacco use, and hypothyroidism who is being seen today for follow-up.  She was first seen in 2016 when she is diagnosed with acute systolic and diastolic heart failure.  Prior to that she had a stress Myoview 09/2013 that revealed LVEF 48% and no ischemia.  She was followed by her primary care physician and switch from hydrochlorothiazide to Lasix.  She had no symptoms at the time she first saw Dr. Angelena Form in 2016.  She was started on carvedilol and lisinopril.    Lisinopril was subsequently converted to losartan due to cough.  Her symptoms were felt to be nonischemic.  She notes that she was under a lot of stress at the time.  She subsequently transferred her care to [facility] with Dr. Kirtland Bouchard.  He underwent CPX testing in 2018 and was found to have moderate cardiac limitation and deconditioning.  Echo 01/2017 revealed LVEF 50-55%.  Her most recent echo 05/2018 revealed LVEF 55 to 60% with grade 1 diastolic dysfunction.  She reportedly has a history of atrial fibrillation that occurred in the setting of her initial heart failure symptoms.  She has not had any recurrence and has never been on anticoagulation.  She was symptomatic in atrial fibrillation.  [name] had a spinal fusion in December 2020.  Her back pain has been better but still troubles her at times.  It makes it difficult for her to exercise.  After her surgery she had some issues with bladder incontinence and lowering the dose of her diuretic seems to help this as well.  She last saw Dr. Jenny Reichmann on 10/15/2020 and was started on Zetia.  She hasn't  picked it up yet.  She wasn't tolerating statins due to muscle pain.  She didn't tolerate it even every other day.  At her last appointment, she was doing well. Since her last appointment, she was seen by cardiology at [facility] 05/2021 for evaluation of LE edema. She also reported exertional dyspnea and was unsure if it was asthma or heart failure.She had an echo which revealed an LVEF 60-65%. Her atrial pressure was 35mmHg. She established care with a new cardiologist 06/2021 and spironolactone was added. He thought her swelling was more related to lymphedema than heart failure. BNP was not elevated.  Today, she is doing well. She is recovering from a recent cold infection and her voice is sore. She reports continued bilateral LE swelling which she believes may be related to taking prednisone for the cold and taking too many fluid pills. She recently bought men's compression socks from [employer] and noticed improvements to her swelling. However, the socks feel tight on her. She reports bilateral LE cramps. Drinking Gatorade improves her cramps but exacerbates her swelling. Her weight fluctuates. Last week, her weight was 248 lbs which increased to 257 lbs this week. Yesterday, her weight was 251 lbs. Without her fluid medication, her weight will go up to 257 lbs. She did not take her spironolactone because she did not tolerate it. She also did not take amlodipine because she was advised it would worsen her kidney  function. Daily, she takes 12.5mg  of losartan and 40 mg of potassium. She denies any palpitations, chest pain, or shortness of breath, lightheadedness, headaches, syncope, orthopnea, PND, or exertional symptoms. Of note, she had Afib years ago but her heart went back into rhythm on its own.   Past Medical History:  Diagnosis Date   ALLERGIC RHINITIS 05/09/2008   Allergy    ANXIETY 05/23/2007   ARM PAIN, LEFT 10/23/2009   Arthritis    Asthma    ASTHMA 05/23/2007   Back pain    Cardiomyopathy ([place])  09/08/2015   Chest pain    CHF (congestive heart failure) (HCC)    Chronic diastolic heart failure ([place]) 01/22/2020   Constipation    CONTACT DERMATITIS 06/09/2009   Diabetes mellitus without complication ([place])    type II   Dyspnea    allergies-dust, cig smoke, rag weeds   ENDOMETRIOSIS NOS 05/23/2007   FACIAL PAIN 06/02/2010   Food allergy    FREQUENCY, URINARY 05/17/2010   GERD (gastroesophageal reflux disease)    GLUCOSE INTOLERANCE 08/28/2009   HERPES SIMPLEX, UNCOMPLICATED 3/81/8299   Hyperlipidemia 08/28/2015   HYPERTHYROIDISM 05/23/2007   Hypoactive thyroid    HYPOTHYROIDISM 05/09/2008   Impaired glucose tolerance 01/28/2011   INSOMNIA, HX OF 05/23/2007   Joint pain    LATERAL EPICONDYLITIS, RIGHT 03/10/2009   LIPOMA 10/22/2010   NECK PAIN 11/10/2010   OBESITY 05/23/2007   Osteopenia 09/28/2018   Plantar fasciitis    Both feet   Pre-diabetes    SHOULDER PAIN, LEFT 05/17/2010   SINUSITIS- ACUTE-NOS 11/03/2008   SKIN LESION 11/10/2010   Sleep apnea    Stage III chronic kidney disease ([place])    Stage III   Unspecified Chest Pain 07/25/2008   UNSPECIFIED URTICARIA 06/04/2009   URI 08/28/2009   UTI 04/29/2008   VITAMIN D DEFICIENCY 08/28/2009   Wheezing 07/12/2010    Past Surgical History:  Procedure Laterality Date   COLONOSCOPY     ENDOMETRIAL ABLATION     LAMINECTOMY  1996   LAPAROTOMY     exploratory   lower back surgery  07/06/2017   cyst   POLYPECTOMY     s/p endometrial ablation  12/06   TUBAL LIGATION     UTERINE FIBROID SURGERY     WISDOM TOOTH EXTRACTION      Current Outpatient Medications  Medication Sig Dispense Refill   acetaminophen (TYLENOL) 650 MG CR tablet Take by mouth.     albuterol (VENTOLIN HFA) 108 (90 Base) MCG/ACT inhaler Inhale 2 puffs into the lungs every 4 (four) hours as needed. 8 g 11   allopurinol (ZYLOPRIM) 100 MG tablet Take 2 tablets (200 mg total) by mouth daily. Take 200 mg by mouth daily. 180 tablet 3   aspirin 81 MG EC tablet Take 81 mg  by mouth daily.     budesonide-formoterol (SYMBICORT) 160-4.5 MCG/ACT inhaler Inhale 2 puffs into the lungs in the morning and at bedtime. 1 each 12   cyclobenzaprine (FLEXERIL) 10 MG tablet TAKE 1 TABLET(10 MG) BY MOUTH AT BEDTIME AS NEEDED FOR MUSCLE SPASMS 90 tablet 1   estradiol (VIVELLE-DOT) 0.05 MG/24HR patch 1 patch 2 (two) times a week.     ezetimibe (ZETIA) 10 MG tablet Take 1 tablet (10 mg total) by mouth daily. 90 tablet 3   fexofenadine (ALLEGRA) 180 MG tablet Take 180 mg by mouth daily.     fluticasone (FLONASE) 50 MCG/ACT nasal spray Place 2 sprays into both nostrils daily. (Patient taking differently:  Place 2 sprays into both nostrils daily as needed for allergies.) 16 g 6   gabapentin (NEURONTIN) 100 MG capsule Take 1 capsule (100 mg total) by mouth 2 (two) times daily as needed. 180 capsule 2   guaiFENesin (MUCINEX) 600 MG 12 hr tablet Take 2 tablets (1,200 mg total) by mouth 2 (two) times daily as needed for to loosen phlegm. 60 tablet 1   KLOR-CON M20 20 MEQ tablet Take 1 tablet (20 mEq total) by mouth daily. OK TO TAKE AN EXTRA ON DAYS YOU TAKE EXTRA TORSEMIDE 180 tablet 3   levothyroxine (SYNTHROID) 137 MCG tablet Take 1 tablet (137 mcg total) by mouth daily before breakfast. 90 tablet 3   losartan (COZAAR) 25 MG tablet Take 12.5 mg by mouth daily.     Menthol-Methyl Salicylate (MUSCLE RUB) 10-15 % CREA Apply 1 application topically as needed for muscle pain.     Multiple Vitamin (MULTIVITAMIN) capsule Take 1 capsule by mouth daily.     PENNSAID 2 % SOLN APPLY 2 PUMPS TOPICALLY TO THE AFFECTED AREA(S) TWICE DAILY AS DIRECTED 112 g 2   predniSONE (DELTASONE) 10 MG tablet 3 tabs by mouth per day for 3 days,2tabs per day for 3 days,1tab per day for 3 days 18 tablet 0   progesterone (PROMETRIUM) 100 MG capsule Take 100 mg by mouth daily.     torsemide (DEMADEX) 20 MG tablet Take 20 mg by mouth as directed. TAKE 3 TABLETS IN THE MORNING     traMADol (ULTRAM) 50 MG tablet Take 1  tablet (50 mg total) by mouth every 6 (six) hours as needed. 30 tablet 0   valACYclovir (VALTREX) 1000 MG tablet Take 0.5 tablets (500 mg total) by mouth daily as needed (cold sores). 60 tablet 1   No current facility-administered medications for this visit.    Allergies:   Shellfish allergy, Clarithromycin, Latex, Metronidazole, Zostavax [zoster vaccine live], Adhesive [tape], Amiloride, Gadobenate, Lisinopril, Other, and Peanut oil    Social History:  The patient  reports that she quit smoking about 33 years ago. Her smoking use included cigarettes. She has a 6.00 pack-year smoking history. She has never used smokeless tobacco. She reports that she does not drink alcohol and does not use drugs.   Family History:  The patient's family history includes COPD in her father; Cancer in her maternal grandmother; Depression in her mother; Diabetes in her cousin and sister; Heart disease in her cousin and mother; Hypertension in her cousin and father; Kidney disease in her sister.    ROS:  Please see the history of present illness. (+) Bilateral LE swelling (+) Bilateral LE cramps All other systems are reviewed and negative.   PHYSICAL EXAM: VS:  BP 114/72 (BP Location: Left Arm, Patient Position: Sitting, Cuff Size: Large)   Pulse 90   Ht 5\' 3"  (1.6 m)   Wt 256 lb 3.2 oz (116.2 kg)   BMI 45.38 kg/m  , BMI Body mass index is 45.38 kg/m. GENERAL:  Well appearing HEENT:  Pupils equal round and reactive, fundi not visualized, oral mucosa unremarkable NECK:  No jugular venous distention, waveform within normal limits, carotid upstroke brisk and symmetric, no bruits, no thyromegaly LYMPHATICS:  No cervical adenopathy LUNGS:  Diffuse wheezing  HEART:  RRR.  PMI not displaced or sustained,S1 and S2 within normal limits, no S3, no S4, no clicks, no rubs, no murmurs ABD:  Flat, positive bowel sounds normal in frequency in pitch, no bruits, no rebound, no guarding, no midline  pulsatile mass, no  hepatomegaly, no splenomegaly EXT:  2 plus pulses throughout, 1+ pitting edema above the ankles, no cyanosis no clubbing SKIN:  No rashes no nodules NEURO:  Cranial nerves II through XII grossly intact, motor grossly intact throughout PSYCH:  Cognitively intact, oriented to person place and time  EKG:   09/15/21: Sinus rhythm, rate 90 bpm; LAD 04/21/20: Sinus rhythm.  Rate 79 bpm.  Prior inferior infarct. 01/22/20 sinus rhythm.  Rate 74 bpm.  Low voltage.    Lower Venous DVT 01/14/21 RIGHT:  - No evidence of common femoral vein obstruction.  LEFT:  - No evidence of deep vein thrombosis in the lower extremity.  No indirect evidence of obstruction proximal to the inguinal ligament.  - No cystic structure found in the popliteal fossa.   LE Venous Study 05/31/19 Left: There is no evidence of acute deep vein thrombosis in the lower  extremity. No evidence of superficial thrombophlebitis.   Echo 09/08/15 - Left ventricle: The cavity size was normal. There was mild focal    basal hypertrophy of the septum. Systolic function was mildly to    moderately reduced. The estimated ejection fraction was in the    range of 40% to 45%. Hypokinesis of the anteroseptal and    inferoseptal myocardium. Doppler parameters are consistent with    abnormal left ventricular relaxation (grade 1 diastolic dysfunction).  - Right ventricle: The cavity size was normal. Wall thickness was    normal. Systolic function was normal.  - Tricuspid valve: There was trivial regurgitation.  - Pulmonary arteries: PA peak pressure: 26 mm Hg (S).  - Dyssynchrony, with contraction of the anterior and lateral walls    prior to the septal and inferior walls.   Recent Labs: 10/06/2020: ALT 18; Hemoglobin 13.4; Platelets 287.0 02/03/2021: BUN 22; Creatinine, Ser 1.52; Potassium 3.5; Sodium 143 02/22/2021: TSH 1.50 06/30/2021: B Natriuretic Peptide 18.8    Lipid Panel    Component Value Date/Time   CHOL 204 (H) 10/06/2020 1057    CHOL 148 07/18/2019 0900   TRIG 125.0 10/06/2020 1057   HDL 72.50 10/06/2020 1057   HDL 83 07/18/2019 0900   CHOLHDL 3 10/06/2020 1057   VLDL 25.0 10/06/2020 1057   LDLCALC 107 (H) 10/06/2020 1057   LDLCALC 50 07/18/2019 0900      Wt Readings from Last 3 Encounters:  09/15/21 256 lb 3.2 oz (116.2 kg)  08/19/21 254 lb (115.2 kg)  05/13/21 256 lb 8 oz (116.3 kg)      ASSESSMENT AND PLAN:  Paroxysmal atrial fibrillation (HCC) It is unclear when this occurred. She remembers having palpitations in the past when she was under a lot of stress but never had a DCCV or was on anticoagulation.  Continue to monitor for now. No atrial fibrillation noted on prior ECGs.  OSA on CPAP Continue CPAP.  Chronic heart failure with preserved ejection fraction (HCC) LVEF previously reduced but has normalized.  She had an echo that [facility] recently that again revealed normal systolic function and no significant volume overload.  I think that her lower extremity edema is more related to venous insufficiency than heart failure.  Her renal function has also been quite stable.  She notes that she does well when she stops her diuretics.  She will get some compression stockings.  Right now she is also on steroids which is also contributing to her swelling.  She will continue taking torsemide 60 mg in the morning and 40 in the evenings  while on steroids.  Continue potassium at 40 mEq twice daily.  Once she stops her prednisone she will switch to 60 mg of torsemide daily and potassium 20 mEq twice daily.  Check a BMP and a BNP next week.  Acute sinus infection She is currently been treated with antibiotics and steroids.     Current medicines are reviewed at length with the patient today.  The patient does not have concerns regarding medicines.  The following changes have been made:  no change  Labs/ tests ordered today include:   Orders Placed This Encounter  Procedures   Basic metabolic panel    EKG 87-OMVE     Disposition:   FU with Danicia Terhaar C. Oval Linsey, MD, [facility] in 3-4 months  I,Mykaella Javier,acting as a scribe for Skeet Latch, MD.,have documented all relevant documentation on the behalf of Skeet Latch, MD,as directed by  Skeet Latch, MD while in the presence of Skeet Latch, MD.  I, [place] Oval Linsey, MD have reviewed all documentation for this visit.  The documentation of the exam, diagnosis, procedures, and orders on 09/15/2021 are all accurate and complete.   Signed, Jaaron Oleson C. Oval Linsey, MD, [facility]  09/15/2021 1:45 PM     [facility]

## 2021-09-15 NOTE — Patient Instructions (Signed)
Medication Instructions:  TAKE TORSEMIDE 60 MG IN THE MORNING AND 40 MG IN THE LATE AFTERNOON UNTIL YOU FINISH YOUR PREDNISONE. ONCE YOU FINISH PREDNISONE TAKE TORSEMIDE 60 MG IN MORNING ONLY   TAKE POTASSIUM 40 MEQ TWICE A DAY UNTIL YOU FINISH PREDNISONE. ONCE YOU FINISH PREDNISONE TAKE POTASSIUM 20 MEQ TWICE A DAY   *If you need a refill on your cardiac medications before your next appointment, please call your pharmacy*  Lab Work: BMET IN 1 WEEK   If you have labs (blood work) drawn today and your tests are completely normal, you will receive your results only by: MyChart Message (if you have MyChart) OR A paper copy in the mail If you have any lab test that is abnormal or we need to change your treatment, we will call you to review the results.  Testing/Procedures: NONE   Follow-Up: At [facility], you and your health needs are our priority.  As part of our continuing mission to provide you with exceptional heart care, we have created designated Provider Care Teams.  These Care Teams include your primary Cardiologist (physician) and Advanced Practice Providers (APPs -  Physician Assistants and Nurse Practitioners) who all work together to provide you with the care you need, when you need it.  We recommend signing up for the patient portal called "MyChart".  Sign up information is provided on this After Visit Summary.  MyChart is used to connect with patients for Virtual Visits (Telemedicine).  Patients are able to view lab/test results, encounter notes, upcoming appointments, etc.  Non-urgent messages can be sent to your provider as well.   To learn more about what you can do with MyChart, go to [email or web address].    Your next appointment:   11/12/2021 10:00 am with Dr Oval Linsey   Other Instructions WEAR COMPRESSION SOCKS

## 2021-09-16 DIAGNOSIS — M5412 Radiculopathy, cervical region: Secondary | ICD-10-CM | POA: Diagnosis not present

## 2021-09-16 NOTE — Telephone Encounter (Signed)
Patient seen in clinic 12/6

## 2021-09-17 ENCOUNTER — Other Ambulatory Visit: Payer: Self-pay | Admitting: Internal Medicine

## 2021-09-17 ENCOUNTER — Other Ambulatory Visit: Payer: Self-pay

## 2021-09-17 ENCOUNTER — Ambulatory Visit (INDEPENDENT_AMBULATORY_CARE_PROVIDER_SITE_OTHER): Payer: BC Managed Care – PPO | Admitting: Internal Medicine

## 2021-09-17 ENCOUNTER — Ambulatory Visit (INDEPENDENT_AMBULATORY_CARE_PROVIDER_SITE_OTHER): Payer: BC Managed Care – PPO

## 2021-09-17 ENCOUNTER — Encounter: Payer: Self-pay | Admitting: Internal Medicine

## 2021-09-17 VITALS — BP 108/70 | HR 81 | Temp 97.9°F | Ht 63.0 in | Wt 253.0 lb

## 2021-09-17 DIAGNOSIS — R7303 Prediabetes: Secondary | ICD-10-CM | POA: Diagnosis not present

## 2021-09-17 DIAGNOSIS — R062 Wheezing: Secondary | ICD-10-CM

## 2021-09-17 DIAGNOSIS — R052 Subacute cough: Secondary | ICD-10-CM

## 2021-09-17 DIAGNOSIS — R059 Cough, unspecified: Secondary | ICD-10-CM | POA: Diagnosis not present

## 2021-09-17 DIAGNOSIS — I10 Essential (primary) hypertension: Secondary | ICD-10-CM | POA: Diagnosis not present

## 2021-09-17 MED ORDER — HYDROCODONE BIT-HOMATROP MBR 5-1.5 MG/5ML PO SOLN: 5.0000 mL | Freq: Four times a day (QID) | ORAL | 0 refills | Status: AC | PRN

## 2021-09-17 MED ORDER — PREDNISONE 10 MG PO TABS: ORAL_TABLET | ORAL | 0 refills | Status: DC

## 2021-09-17 MED ORDER — LEVOFLOXACIN 500 MG PO TABS: 500.0000 mg | ORAL_TABLET | Freq: Every day | ORAL | 0 refills | Status: AC

## 2021-09-17 MED ORDER — METHYLPREDNISOLONE ACETATE 80 MG/ML IJ SUSP
80.0000 mg | Freq: Once | INTRAMUSCULAR | Status: AC
Start: 2021-09-17 — End: 2021-09-17
  Administered 2021-09-17: 80 mg via INTRAMUSCULAR

## 2021-09-17 NOTE — Progress Notes (Signed)
Patient ID: Suzanne Hansen, female   DOB: [birth year], 65 y.o.   MRN: [id number]        Chief Complaint: follow up HTN, hyperglycemia, cough wheezing       HPI:  Suzanne Hansen is a 65 y.o. female Here with acute onset mild to mod 7 days ST, HA, general weakness and malaise, with prod cough greenish sputum, but Pt denies chest pain, increased sob or doe, wheezing, orthopnea, PND, increased LE swelling, palpitations, dizziness or syncope, except for onset mild wheezing sob since last pm.  Covid neg at home yesterday.   Pt denies polydipsia, polyuria, or low sugar symptoms No new focal neuro s/s.   Pt denies wt loss, night sweats, loss of appetite, or other constitutional symptoms.       Wt Readings from Last 3 Encounters:  09/17/21 253 lb (114.8 kg)  09/15/21 256 lb 3.2 oz (116.2 kg)  08/19/21 254 lb (115.2 kg)   BP Readings from Last 3 Encounters:  09/17/21 108/70  09/15/21 114/72  08/19/21 120/70         Past Medical History:  Diagnosis Date   ALLERGIC RHINITIS 05/09/2008   Allergy    ANXIETY 05/23/2007   ARM PAIN, LEFT 10/23/2009   Arthritis    Asthma    ASTHMA 05/23/2007   Back pain    Cardiomyopathy ([place]) 09/08/2015   Chest pain    CHF (congestive heart failure) (HCC)    Chronic diastolic heart failure ([place]) 01/22/2020   Constipation    CONTACT DERMATITIS 06/09/2009   Diabetes mellitus without complication ([place])    type II   Dyspnea    allergies-dust, cig smoke, rag weeds   ENDOMETRIOSIS NOS 05/23/2007   FACIAL PAIN 06/02/2010   Food allergy    FREQUENCY, URINARY 05/17/2010   GERD (gastroesophageal reflux disease)    GLUCOSE INTOLERANCE 08/28/2009   HERPES SIMPLEX, UNCOMPLICATED 01/16/8118   Hyperlipidemia 08/28/2015   HYPERTHYROIDISM 05/23/2007   Hypoactive thyroid    HYPOTHYROIDISM 05/09/2008   Impaired glucose tolerance 01/28/2011   INSOMNIA, HX OF 05/23/2007   Joint pain    LATERAL EPICONDYLITIS, RIGHT 03/10/2009   LIPOMA 10/22/2010   NECK PAIN 11/10/2010   OBESITY 05/23/2007    Osteopenia 09/28/2018   Plantar fasciitis    Both feet   Pre-diabetes    SHOULDER PAIN, LEFT 05/17/2010   SINUSITIS- ACUTE-NOS 11/03/2008   SKIN LESION 11/10/2010   Sleep apnea    Stage III chronic kidney disease ([place])    Stage III   Unspecified Chest Pain 07/25/2008   UNSPECIFIED URTICARIA 06/04/2009   URI 08/28/2009   UTI 04/29/2008   VITAMIN D DEFICIENCY 08/28/2009   Wheezing 07/12/2010   Past Surgical History:  Procedure Laterality Date   COLONOSCOPY     ENDOMETRIAL ABLATION     LAMINECTOMY  1996   LAPAROTOMY     exploratory   lower back surgery  07/06/2017   cyst   POLYPECTOMY     s/p endometrial ablation  12/06   TUBAL LIGATION     UTERINE FIBROID SURGERY     WISDOM TOOTH EXTRACTION      reports that she quit smoking about 33 years ago. Her smoking use included cigarettes. She has a 6.00 pack-year smoking history. She has never used smokeless tobacco. She reports that she does not drink alcohol and does not use drugs. family history includes COPD in her father; Cancer in her maternal grandmother; Depression in her mother; Diabetes in her cousin and sister; Heart disease  in her cousin and mother; Hypertension in her cousin and father; Kidney disease in her sister. Allergies  Allergen Reactions   Shellfish Allergy Anaphylaxis   Clarithromycin Nausea And Vomiting and Nausea Only   Latex Itching and Other (See Comments)    ITCHING AND COLD SORES AROUND MOUTH WHEN LATEX GLOVES USED BY THE DENTIST/HYGENTIST   Metronidazole Nausea And Vomiting and Nausea Only   Zostavax [Zoster Vaccine Live]     Arm swelled   Adhesive [Tape] Dermatitis    Plastic tape    Amiloride     Other reaction(s): foot numbness   Gadobenate    Lisinopril Cough   Other Dermatitis    Plastic tape    Peanut Oil     Pt says it's ok   Current Outpatient Medications on File Prior to Visit  Medication Sig Dispense Refill   acetaminophen (TYLENOL) 650 MG CR tablet Take by mouth.     albuterol  (VENTOLIN HFA) 108 (90 Base) MCG/ACT inhaler Inhale 2 puffs into the lungs every 4 (four) hours as needed. 8 g 11   allopurinol (ZYLOPRIM) 100 MG tablet Take 2 tablets (200 mg total) by mouth daily. Take 200 mg by mouth daily. 180 tablet 3   aspirin 81 MG EC tablet Take 81 mg by mouth daily.     budesonide-formoterol (SYMBICORT) 160-4.5 MCG/ACT inhaler Inhale 2 puffs into the lungs in the morning and at bedtime. 1 each 12   cyclobenzaprine (FLEXERIL) 10 MG tablet TAKE 1 TABLET(10 MG) BY MOUTH AT BEDTIME AS NEEDED FOR MUSCLE SPASMS 90 tablet 1   estradiol (VIVELLE-DOT) 0.05 MG/24HR patch 1 patch 2 (two) times a week.     ezetimibe (ZETIA) 10 MG tablet Take 1 tablet (10 mg total) by mouth daily. 90 tablet 3   fexofenadine (ALLEGRA) 180 MG tablet Take 180 mg by mouth daily.     fluticasone (FLONASE) 50 MCG/ACT nasal spray Place 2 sprays into both nostrils daily. (Patient taking differently: Place 2 sprays into both nostrils daily as needed for allergies.) 16 g 6   gabapentin (NEURONTIN) 100 MG capsule Take 1 capsule (100 mg total) by mouth 2 (two) times daily as needed. 180 capsule 2   guaiFENesin (MUCINEX) 600 MG 12 hr tablet Take 2 tablets (1,200 mg total) by mouth 2 (two) times daily as needed for to loosen phlegm. 60 tablet 1   KLOR-CON M20 20 MEQ tablet Take 1 tablet (20 mEq total) by mouth daily. OK TO TAKE AN EXTRA ON DAYS YOU TAKE EXTRA TORSEMIDE 180 tablet 3   levothyroxine (SYNTHROID) 137 MCG tablet Take 1 tablet (137 mcg total) by mouth daily before breakfast. 90 tablet 3   losartan (COZAAR) 25 MG tablet Take 12.5 mg by mouth daily.     Menthol-Methyl Salicylate (MUSCLE RUB) 10-15 % CREA Apply 1 application topically as needed for muscle pain.     Multiple Vitamin (MULTIVITAMIN) capsule Take 1 capsule by mouth daily.     PENNSAID 2 % SOLN APPLY 2 PUMPS TOPICALLY TO THE AFFECTED AREA(S) TWICE DAILY AS DIRECTED 112 g 2   progesterone (PROMETRIUM) 100 MG capsule Take 100 mg by mouth daily.      torsemide (DEMADEX) 20 MG tablet Take 20 mg by mouth as directed. TAKE 3 TABLETS IN THE MORNING     traMADol (ULTRAM) 50 MG tablet Take 1 tablet (50 mg total) by mouth every 6 (six) hours as needed. 30 tablet 0   No current facility-administered medications on file prior to visit.  ROS:  All others reviewed and negative.  Objective        PE:  BP 108/70 (BP Location: Left Arm, Patient Position: Sitting, Cuff Size: Large)   Pulse 81   Temp 97.9 F (36.6 C) (Oral)   Ht 5\' 3"  (1.6 m)   Wt 253 lb (114.8 kg)   SpO2 99%   BMI 44.82 kg/m                 Constitutional: Pt appears in NAD               HENT: Head: NCAT.                Right Ear: External ear normal.                 Left Ear: External ear normal.  Bilat tm's with mild erythema.  Max sinus areas non tender.  Pharynx with mild erythema, no exudate               Eyes: . Pupils are equal, round, and reactive to light. Conjunctivae and EOM are normal               Nose: without d/c or deformity               Neck: Neck supple. Gross normal ROM               Cardiovascular: Normal rate and regular rhythm.                 Pulmonary/Chest: Effort normal and breath sounds without rales but with midl bilateral wheezing.                Neurological: Pt is alert. At baseline orientation, motor grossly intact               Skin: Skin is warm. No rashes, no other new lesions, LE edema - none               Psychiatric: Pt behavior is normal without agitation   Micro: none  Cardiac tracings I have personally interpreted today:  none  Pertinent Radiological findings (summarize): none   Lab Results  Component Value Date   WBC 6.8 10/06/2020   HGB 13.4 10/06/2020   HCT 41.0 10/06/2020   PLT 287.0 10/06/2020   GLUCOSE 61 (L) 02/03/2021   CHOL 204 (H) 10/06/2020   TRIG 125.0 10/06/2020   HDL 72.50 10/06/2020   LDLCALC 107 (H) 10/06/2020   ALT 18 10/06/2020   AST 19 10/06/2020   NA 143 02/03/2021   K 3.5 02/03/2021   CL  100 02/03/2021   CREATININE 1.52 (H) 02/03/2021   BUN 22 02/03/2021   CO2 27 02/03/2021   TSH 1.50 02/22/2021   INR 1.0 03/21/2017   HGBA1C 5.5 10/06/2020   MICROALBUR <0.7 10/02/2019   Assessment/Plan:  Suzanne Hansen is a 65 y.o. Black or African American [2] female with  has a past medical history of ALLERGIC RHINITIS (05/09/2008), Allergy, ANXIETY (05/23/2007), ARM PAIN, LEFT (10/23/2009), Arthritis, Asthma, ASTHMA (05/23/2007), Back pain, Cardiomyopathy ([place]) (09/08/2015), Chest pain, CHF (congestive heart failure) ([place]), Chronic diastolic heart failure ([place]) (01/22/2020), Constipation, CONTACT DERMATITIS (06/09/2009), Diabetes mellitus without complication ([place]), Dyspnea, ENDOMETRIOSIS NOS (05/23/2007), FACIAL PAIN (06/02/2010), Food allergy, FREQUENCY, URINARY (05/17/2010), GERD (gastroesophageal reflux disease), GLUCOSE INTOLERANCE (08/28/2009), HERPES SIMPLEX, UNCOMPLICATED (02/09/8881), Hyperlipidemia (08/28/2015), HYPERTHYROIDISM (05/23/2007), Hypoactive thyroid, HYPOTHYROIDISM (05/09/2008), Impaired glucose tolerance (01/28/2011), INSOMNIA, HX OF (05/23/2007), Joint pain, LATERAL EPICONDYLITIS,  RIGHT (03/10/2009), LIPOMA (10/22/2010), NECK PAIN (11/10/2010), OBESITY (05/23/2007), Osteopenia (09/28/2018), Plantar fasciitis, Pre-diabetes, SHOULDER PAIN, LEFT (05/17/2010), SINUSITIS- ACUTE-NOS (11/03/2008), SKIN LESION (11/10/2010), Sleep apnea, Stage III chronic kidney disease ([place]), Unspecified Chest Pain (07/25/2008), UNSPECIFIED URTICARIA (06/04/2009), URI (08/28/2009), UTI (04/29/2008), VITAMIN D DEFICIENCY (08/28/2009), and Wheezing (07/12/2010).  Cough Mild to mod, c/w bronchitis vs pna,  for antibx course, cough med prn, cxr,  to f/u any worsening symptoms or concerns  Wheezing Mild to mod, for predpac asd, depomedrol im 80, to f/u any worsening symptoms or concerns  Prediabetes Lab Results  Component Value Date   HGBA1C 5.5 10/06/2020   Stable, pt to continue current medical treatment  -  diet   Essential (primary) hypertension BP Readings from Last 3 Encounters:  09/17/21 108/70  09/15/21 114/72  08/19/21 120/70   Stable, pt to continue medical treatment [place]  Followup: Return if symptoms worsen or fail to improve.  Cathlean Cower, MD 09/18/2021 9:09 PM [facility]

## 2021-09-17 NOTE — Patient Instructions (Signed)
You had the steroid shot today  Please take all new medication as prescribed - the antibiotic, cough medicine, prednisone  Please continue all other medications as before, and refills have been done if requested.  Please have the pharmacy call with any other refills you may need.  Please continue your efforts at being more active, low cholesterol diet, and weight control.  Please keep your appointments with your specialists as you may have planned  Please go to the XRAY Department in the first floor for the x-ray testing  You will be contacted by phone if any changes need to be made immediately.  Otherwise, you will receive a letter about your results with an explanation, but please check with MyChart first.  Please remember to sign up for MyChart if you have not done so, as this will be important to you in the future with finding out test results, communicating by private email, and scheduling acute appointments online when needed.

## 2021-09-18 ENCOUNTER — Encounter: Payer: Self-pay | Admitting: Internal Medicine

## 2021-09-18 NOTE — Assessment & Plan Note (Signed)
Mild to mod, c/w bronchitis vs pna,  for antibx course, cough med prn, cxr,  to f/u any worsening symptoms or concerns

## 2021-09-18 NOTE — Assessment & Plan Note (Signed)
Lab Results  Component Value Date   HGBA1C 5.5 10/06/2020   Stable, pt to continue current medical treatment  - diet

## 2021-09-18 NOTE — Assessment & Plan Note (Signed)
BP Readings from Last 3 Encounters:  09/17/21 108/70  09/15/21 114/72  08/19/21 120/70   Stable, pt to continue medical treatment [place]

## 2021-09-18 NOTE — Assessment & Plan Note (Signed)
Mild to mod, for predpac asd, depomedrol im 80, to f/u any worsening symptoms or concerns

## 2021-09-27 DIAGNOSIS — Z5181 Encounter for therapeutic drug level monitoring: Secondary | ICD-10-CM | POA: Diagnosis not present

## 2021-09-27 DIAGNOSIS — I10 Essential (primary) hypertension: Secondary | ICD-10-CM | POA: Diagnosis not present

## 2021-09-28 LAB — BASIC METABOLIC PANEL
BUN/Creatinine Ratio: 17 (ref 12–28)
BUN: 22 mg/dL (ref 8–27)
CO2: 22 mmol/L (ref 20–29)
Calcium: 10.2 mg/dL (ref 8.7–10.3)
Chloride: 98 mmol/L (ref 96–106)
Creatinine, Ser: 1.3 mg/dL — ABNORMAL HIGH (ref 0.57–1.00)
Glucose: 86 mg/dL (ref 70–99)
Potassium: 3.8 mmol/L (ref 3.5–5.2)
Sodium: 143 mmol/L (ref 134–144)
eGFR: 46 mL/min/{1.73_m2} — ABNORMAL LOW (ref >59)

## 2021-10-01 ENCOUNTER — Ambulatory Visit (INDEPENDENT_AMBULATORY_CARE_PROVIDER_SITE_OTHER): Payer: BC Managed Care – PPO

## 2021-10-01 ENCOUNTER — Other Ambulatory Visit: Payer: Self-pay

## 2021-10-01 ENCOUNTER — Ambulatory Visit (INDEPENDENT_AMBULATORY_CARE_PROVIDER_SITE_OTHER): Payer: BC Managed Care – PPO | Admitting: Internal Medicine

## 2021-10-01 ENCOUNTER — Encounter: Payer: Self-pay | Admitting: Internal Medicine

## 2021-10-01 VITALS — BP 112/68 | HR 63 | Ht 63.0 in | Wt 253.0 lb

## 2021-10-01 DIAGNOSIS — M170 Bilateral primary osteoarthritis of knee: Secondary | ICD-10-CM | POA: Diagnosis not present

## 2021-10-01 DIAGNOSIS — R059 Cough, unspecified: Secondary | ICD-10-CM

## 2021-10-01 DIAGNOSIS — E785 Hyperlipidemia, unspecified: Secondary | ICD-10-CM

## 2021-10-01 DIAGNOSIS — R062 Wheezing: Secondary | ICD-10-CM | POA: Diagnosis not present

## 2021-10-01 DIAGNOSIS — R7303 Prediabetes: Secondary | ICD-10-CM

## 2021-10-01 DIAGNOSIS — E538 Deficiency of other specified B group vitamins: Secondary | ICD-10-CM

## 2021-10-01 DIAGNOSIS — E559 Vitamin D deficiency, unspecified: Secondary | ICD-10-CM

## 2021-10-01 DIAGNOSIS — M109 Gout, unspecified: Secondary | ICD-10-CM

## 2021-10-01 MED ORDER — METHYLPREDNISOLONE ACETATE 80 MG/ML IJ SUSP
80.0000 mg | Freq: Once | INTRAMUSCULAR | Status: AC
  Administered 2021-10-01: 13:00:00 80 mg via INTRAMUSCULAR

## 2021-10-01 MED ORDER — PREDNISONE 10 MG PO TABS: ORAL_TABLET | ORAL | 0 refills | Status: DC

## 2021-10-01 MED ORDER — AMOXICILLIN-POT CLAVULANATE 875-125 MG PO TABS: 1.0000 | ORAL_TABLET | Freq: Two times a day (BID) | ORAL | 0 refills | Status: DC

## 2021-10-01 MED ORDER — HYDROCODONE BIT-HOMATROP MBR 5-1.5 MG/5ML PO SOLN: 5.0000 mL | Freq: Four times a day (QID) | ORAL | 0 refills | Status: AC | PRN

## 2021-10-01 NOTE — Patient Instructions (Signed)
You had the steroid shot today  Please take all new medication as prescribed - the antibiotic, cough medicine and prednisone  Please continue all other medications as before, and refills have been done if requested.  Please have the pharmacy call with any other refills you may need.  Please keep your appointments with your specialists as you may have planned  Please go to the XRAY Department in the first floor for the x-ray testing  You will be contacted by phone if any changes need to be made immediately.  Otherwise, you will receive a letter about your results with an explanation, but please check with MyChart first.  Please remember to sign up for MyChart if you have not done so, as this will be important to you in the future with finding out test results, communicating by private email, and scheduling acute appointments online when needed.

## 2021-10-01 NOTE — Progress Notes (Signed)
Patient ID: Suzanne Hansen, female   DOB: Oct 01, 1956, 65 y.o.   MRN: [id number]        Chief Complaint: follow up possible pna, wheezing       HPI:  Suzanne Hansen is a 65 y.o. female here with c/o better then worsening sinus and chest congestion, fever, cough, greenish sputum, sob/doe/wheezing in the past 2 days after tx for intermittent wheezing at least since nov 10, just wants to finally feel better.  Pt denies chest pain, orthopnea, PND, increased LE swelling, palpitations, dizziness or syncope.   Pt denies recent wt loss, night sweats, loss of appetite, or other constitutional symptoms         Wt Readings from Last 3 Encounters:  10/01/21 253 lb (114.8 kg)  09/17/21 253 lb (114.8 kg)  09/15/21 256 lb 3.2 oz (116.2 kg)   BP Readings from Last 3 Encounters:  10/01/21 112/68  09/17/21 108/70  09/15/21 114/72         Past Medical History:  Diagnosis Date   ALLERGIC RHINITIS 05/09/2008   Allergy    ANXIETY 05/23/2007   ARM PAIN, LEFT 10/23/2009   Arthritis    Asthma    ASTHMA 05/23/2007   Back pain    Cardiomyopathy ([place]) 09/08/2015   Chest pain    CHF (congestive heart failure) (HCC)    Chronic diastolic heart failure ([place]) 01/22/2020   Constipation    CONTACT DERMATITIS 06/09/2009   Diabetes mellitus without complication ([place])    type II   Dyspnea    allergies-dust, cig smoke, rag weeds   ENDOMETRIOSIS NOS 05/23/2007   FACIAL PAIN 06/02/2010   Food allergy    FREQUENCY, URINARY 05/17/2010   GERD (gastroesophageal reflux disease)    GLUCOSE INTOLERANCE 08/28/2009   HERPES SIMPLEX, UNCOMPLICATED 03/16/3015   Hyperlipidemia 08/28/2015   HYPERTHYROIDISM 05/23/2007   Hypoactive thyroid    HYPOTHYROIDISM 05/09/2008   Impaired glucose tolerance 01/28/2011   INSOMNIA, HX OF 05/23/2007   Joint pain    LATERAL EPICONDYLITIS, RIGHT 03/10/2009   LIPOMA 10/22/2010   NECK PAIN 11/10/2010   OBESITY 05/23/2007   Osteopenia 09/28/2018   Plantar fasciitis    Both feet   Pre-diabetes     SHOULDER PAIN, LEFT 05/17/2010   SINUSITIS- ACUTE-NOS 11/03/2008   SKIN LESION 11/10/2010   Sleep apnea    Stage III chronic kidney disease ([place])    Stage III   Unspecified Chest Pain 07/25/2008   UNSPECIFIED URTICARIA 06/04/2009   URI 08/28/2009   UTI 04/29/2008   VITAMIN D DEFICIENCY 08/28/2009   Wheezing 07/12/2010   Past Surgical History:  Procedure Laterality Date   COLONOSCOPY     ENDOMETRIAL ABLATION     LAMINECTOMY  1996   LAPAROTOMY     exploratory   lower back surgery  07/06/2017   cyst   POLYPECTOMY     s/p endometrial ablation  12/06   TUBAL LIGATION     UTERINE FIBROID SURGERY     WISDOM TOOTH EXTRACTION      reports that she quit smoking about 33 years ago. Her smoking use included cigarettes. She has a 6.00 pack-year smoking history. She has never used smokeless tobacco. She reports that she does not drink alcohol and does not use drugs. family history includes COPD in her father; Cancer in her maternal grandmother; Depression in her mother; Diabetes in her cousin and sister; Heart disease in her cousin and mother; Hypertension in her cousin and father; Kidney disease in her sister. Allergies  Allergen Reactions   Shellfish Allergy Anaphylaxis   Clarithromycin Nausea And Vomiting and Nausea Only   Latex Itching and Other (See Comments)    ITCHING AND COLD SORES AROUND MOUTH WHEN LATEX GLOVES USED BY THE DENTIST/HYGENTIST   Metronidazole Nausea And Vomiting and Nausea Only   Zostavax [Zoster Vaccine Live]     Arm swelled   Adhesive [Tape] Dermatitis    Plastic tape    Amiloride     Other reaction(s): foot numbness   Gadobenate    Lisinopril Cough   Other Dermatitis    Plastic tape    Peanut Oil     Pt says it's ok   Current Outpatient Medications on File Prior to Visit  Medication Sig Dispense Refill   acetaminophen (TYLENOL) 650 MG CR tablet Take by mouth.     albuterol (VENTOLIN HFA) 108 (90 Base) MCG/ACT inhaler Inhale 2 puffs into the lungs every 4  (four) hours as needed. 8 g 11   allopurinol (ZYLOPRIM) 100 MG tablet Take 2 tablets (200 mg total) by mouth daily. Take 200 mg by mouth daily. 180 tablet 3   aspirin 81 MG EC tablet Take 81 mg by mouth daily.     budesonide-formoterol (SYMBICORT) 160-4.5 MCG/ACT inhaler Inhale 2 puffs into the lungs in the morning and at bedtime. 1 each 12   cyclobenzaprine (FLEXERIL) 10 MG tablet TAKE 1 TABLET(10 MG) BY MOUTH AT BEDTIME AS NEEDED FOR MUSCLE SPASMS 90 tablet 1   estradiol (VIVELLE-DOT) 0.05 MG/24HR patch 1 patch 2 (two) times a week.     ezetimibe (ZETIA) 10 MG tablet Take 1 tablet (10 mg total) by mouth daily. 90 tablet 3   fexofenadine (ALLEGRA) 180 MG tablet Take 180 mg by mouth daily.     fluticasone (FLONASE) 50 MCG/ACT nasal spray Place 2 sprays into both nostrils daily. (Patient taking differently: Place 2 sprays into both nostrils daily as needed for allergies.) 16 g 6   gabapentin (NEURONTIN) 100 MG capsule Take 1 capsule (100 mg total) by mouth 2 (two) times daily as needed. 180 capsule 2   guaiFENesin (MUCINEX) 600 MG 12 hr tablet Take 2 tablets (1,200 mg total) by mouth 2 (two) times daily as needed for to loosen phlegm. 60 tablet 1   KLOR-CON M20 20 MEQ tablet Take 1 tablet (20 mEq total) by mouth daily. OK TO TAKE AN EXTRA ON DAYS YOU TAKE EXTRA TORSEMIDE 180 tablet 3   levothyroxine (SYNTHROID) 137 MCG tablet Take 1 tablet (137 mcg total) by mouth daily before breakfast. 90 tablet 3   losartan (COZAAR) 25 MG tablet Take 12.5 mg by mouth daily.     Menthol-Methyl Salicylate (MUSCLE RUB) 10-15 % CREA Apply 1 application topically as needed for muscle pain.     Multiple Vitamin (MULTIVITAMIN) capsule Take 1 capsule by mouth daily.     PENNSAID 2 % SOLN APPLY 2 PUMPS TOPICALLY TO THE AFFECTED AREA(S) TWICE DAILY AS DIRECTED 112 g 2   progesterone (PROMETRIUM) 100 MG capsule Take 100 mg by mouth daily.     torsemide (DEMADEX) 20 MG tablet Take 20 mg by mouth as directed. TAKE 3 TABLETS  IN THE MORNING     traMADol (ULTRAM) 50 MG tablet Take 1 tablet (50 mg total) by mouth every 6 (six) hours as needed. 30 tablet 0   valACYclovir (VALTREX) 1000 MG tablet TAKE 1/2 TABLET BY MOUTH EVERY DAY AS NEEDED (COLD SORES) 45 tablet 2   No current facility-administered medications on file  prior to visit.        ROS:  All others reviewed and negative.  Objective        PE:  BP 112/68 (BP Location: Left Arm, Patient Position: Sitting, Cuff Size: Large)    Pulse 63    Ht 5\' 3"  (1.6 m)    Wt 253 lb (114.8 kg)    SpO2 100%    BMI 44.82 kg/m                 Constitutional: Pt appears in NAD               HENT: Head: NCAT.                Right Ear: External ear normal.                 Left Ear: External ear normal. Bilat tm's with mild erythema.  Max sinus areas non tender.  Pharynx with mild erythema, no exudate               Eyes: . Pupils are equal, round, and reactive to light. Conjunctivae and EOM are normal               Nose: without d/c or deformity               Neck: Neck supple. Gross normal ROM               Cardiovascular: Normal rate and regular rhythm.                 Pulmonary/Chest: Effort normal and breath sounds without rales but with few bilateral wheezing.                Abd:  Soft, NT, ND, + BS, no organomegaly               Neurological: Pt is alert. At baseline orientation, motor grossly intact               Skin: Skin is warm. No rashes, no other new lesions, LE edema - trace pedal bialt               Psychiatric: Pt behavior is normal without agitation   Micro: none  Cardiac tracings I have personally interpreted today:  none  Pertinent Radiological findings (summarize): none   Lab Results  Component Value Date   WBC 6.8 10/06/2020   HGB 13.4 10/06/2020   HCT 41.0 10/06/2020   PLT 287.0 10/06/2020   GLUCOSE 86 09/27/2021   CHOL 204 (H) 10/06/2020   TRIG 125.0 10/06/2020   HDL 72.50 10/06/2020   LDLCALC 107 (H) 10/06/2020   ALT 18 10/06/2020   AST  19 10/06/2020   NA 143 09/27/2021   K 3.8 09/27/2021   CL 98 09/27/2021   CREATININE 1.30 (H) 09/27/2021   BUN 22 09/27/2021   CO2 22 09/27/2021   TSH 1.50 02/22/2021   INR 1.0 03/21/2017   HGBA1C 5.5 10/06/2020   MICROALBUR <0.7 10/02/2019   Assessment/Plan:  Romina Mctague is a 65 y.o. Black or African American [2] female with  has a past medical history of ALLERGIC RHINITIS (05/09/2008), Allergy, ANXIETY (05/23/2007), ARM PAIN, LEFT (10/23/2009), Arthritis, Asthma, ASTHMA (05/23/2007), Back pain, Cardiomyopathy ([place]) (09/08/2015), Chest pain, CHF (congestive heart failure) ([place]), Chronic diastolic heart failure ([place]) (01/22/2020), Constipation, CONTACT DERMATITIS (06/09/2009), Diabetes mellitus without complication ([place]), Dyspnea, ENDOMETRIOSIS NOS (05/23/2007), FACIAL PAIN (06/02/2010), Food allergy, FREQUENCY, URINARY (05/17/2010), GERD (  gastroesophageal reflux disease), GLUCOSE INTOLERANCE (08/28/2009), HERPES SIMPLEX, UNCOMPLICATED (0/25/8527), Hyperlipidemia (08/28/2015), HYPERTHYROIDISM (05/23/2007), Hypoactive thyroid, HYPOTHYROIDISM (05/09/2008), Impaired glucose tolerance (01/28/2011), INSOMNIA, HX OF (05/23/2007), Joint pain, LATERAL EPICONDYLITIS, RIGHT (03/10/2009), LIPOMA (10/22/2010), NECK PAIN (11/10/2010), OBESITY (05/23/2007), Osteopenia (09/28/2018), Plantar fasciitis, Pre-diabetes, SHOULDER PAIN, LEFT (05/17/2010), SINUSITIS- ACUTE-NOS (11/03/2008), SKIN LESION (11/10/2010), Sleep apnea, Stage III chronic kidney disease ([place]), Unspecified Chest Pain (07/25/2008), UNSPECIFIED URTICARIA (06/04/2009), URI (08/28/2009), UTI (04/29/2008), VITAMIN D DEFICIENCY (08/28/2009), and Wheezing (07/12/2010).  Cough Mild to mod, c/w bornchitis vs pna, for cxr,  for antibx course, cough med prn,  to f/u any worsening symptoms or concerns  Wheezing Mild to mod, for prednisone asd, depomedrol im 80, to f/u any worsening symptoms or concerns  Vitamin D deficiency Last vitamin D Lab Results  Component Value Date    VD25OH 27.53 (L) 10/06/2020   Low reminded to start oral replacement   Prediabetes Lab Results  Component Value Date   HGBA1C 5.5 10/06/2020   Stable, pt to continue current medical treatment  - diet, wt control, declines further lab today  Followup: Return if symptoms worsen or fail to improve.  Cathlean Cower, MD 10/04/2021 8:43 AM [facility]

## 2021-10-02 ENCOUNTER — Encounter: Payer: Self-pay | Admitting: Internal Medicine

## 2021-10-04 ENCOUNTER — Encounter: Payer: Self-pay | Admitting: Internal Medicine

## 2021-10-04 NOTE — Assessment & Plan Note (Signed)
Mild to mod, c/w bornchitis vs pna, for cxr,  for antibx course, cough med prn,  to f/u any worsening symptoms or concerns

## 2021-10-04 NOTE — Assessment & Plan Note (Signed)
Last vitamin D Lab Results  Component Value Date   VD25OH 27.53 (L) 10/06/2020   Low reminded to start oral replacement

## 2021-10-04 NOTE — Assessment & Plan Note (Signed)
Mild to mod, for prednisone asd, depomedrol im 80, to f/u any worsening symptoms or concerns

## 2021-10-04 NOTE — Assessment & Plan Note (Addendum)
Lab Results  Component Value Date   HGBA1C 5.5 10/06/2020   Stable, pt to continue current medical treatment  - diet, wt control, declines further lab today

## 2021-10-05 ENCOUNTER — Telehealth: Payer: Self-pay | Admitting: Internal Medicine

## 2021-10-05 DIAGNOSIS — R87810 Cervical high risk human papillomavirus (HPV) DNA test positive: Secondary | ICD-10-CM | POA: Diagnosis not present

## 2021-10-05 DIAGNOSIS — R062 Wheezing: Secondary | ICD-10-CM

## 2021-10-05 DIAGNOSIS — R87612 Low grade squamous intraepithelial lesion on cytologic smear of cervix (LGSIL): Secondary | ICD-10-CM | POA: Diagnosis not present

## 2021-10-05 DIAGNOSIS — Z809 Family history of malignant neoplasm, unspecified: Secondary | ICD-10-CM | POA: Diagnosis not present

## 2021-10-05 NOTE — Telephone Encounter (Signed)
I wasn't aware she wanted this, but ok - done

## 2021-10-05 NOTE — Telephone Encounter (Signed)
Patient calling in  Patient says she had visit w/ provider 12.23 & they discussed putting in a referral to a pulmonary specialist  Patient wants to make sure provider submits referral as soon as possible  Please call [phone]

## 2021-10-05 NOTE — Telephone Encounter (Signed)
See below

## 2021-10-06 NOTE — Telephone Encounter (Signed)
Patient notified

## 2021-10-12 DIAGNOSIS — M25551 Pain in right hip: Secondary | ICD-10-CM | POA: Diagnosis not present

## 2021-10-13 ENCOUNTER — Telehealth: Payer: Self-pay | Admitting: Internal Medicine

## 2021-10-13 NOTE — Telephone Encounter (Signed)
Left message for patient to call me back. 

## 2021-10-13 NOTE — Telephone Encounter (Signed)
No sorry, this current dose is the maximum.   I can refer you pulmonary if you like , if this has not already been done   thanks

## 2021-10-13 NOTE — Telephone Encounter (Signed)
1.Medication Requested: budesonide-formoterol (SYMBICORT) 160-4.5 MCG/ACT inhaler  2. Pharmacy (Name, [place], [place]): [id number] - [place], [address]  Phone:  [phone] Fax:  [phone]   3. On Med List: yes  4. Last Visit with PCP: 12.23.22  5. Next visit date with PCP: 01.10.23  **Patient says she is having to use inhaler more frequently than normal & would like for provider to write it for as needed instead of 2 puffs in the morning & 2 puffs at night**   Agent: Please be advised that RX refills may take up to 3 business days. We ask that you follow-up with your pharmacy.

## 2021-10-17 DIAGNOSIS — M5412 Radiculopathy, cervical region: Secondary | ICD-10-CM | POA: Diagnosis not present

## 2021-10-18 DIAGNOSIS — Z6841 Body Mass Index (BMI) 40.0 and over, adult: Secondary | ICD-10-CM | POA: Diagnosis not present

## 2021-10-18 DIAGNOSIS — M7061 Trochanteric bursitis, right hip: Secondary | ICD-10-CM | POA: Diagnosis not present

## 2021-10-19 ENCOUNTER — Other Ambulatory Visit: Payer: Self-pay

## 2021-10-19 ENCOUNTER — Encounter: Payer: Self-pay | Admitting: Internal Medicine

## 2021-10-19 ENCOUNTER — Ambulatory Visit (INDEPENDENT_AMBULATORY_CARE_PROVIDER_SITE_OTHER): Payer: BC Managed Care – PPO | Admitting: Internal Medicine

## 2021-10-19 ENCOUNTER — Ambulatory Visit (INDEPENDENT_AMBULATORY_CARE_PROVIDER_SITE_OTHER): Payer: BC Managed Care – PPO | Admitting: Bariatrics

## 2021-10-19 VITALS — BP 122/72 | HR 80 | Temp 98.2°F | Ht 63.0 in | Wt 259.0 lb

## 2021-10-19 DIAGNOSIS — Z1211 Encounter for screening for malignant neoplasm of colon: Secondary | ICD-10-CM | POA: Diagnosis not present

## 2021-10-19 DIAGNOSIS — I10 Essential (primary) hypertension: Secondary | ICD-10-CM | POA: Diagnosis not present

## 2021-10-19 DIAGNOSIS — E559 Vitamin D deficiency, unspecified: Secondary | ICD-10-CM | POA: Diagnosis not present

## 2021-10-19 DIAGNOSIS — M2550 Pain in unspecified joint: Secondary | ICD-10-CM

## 2021-10-19 DIAGNOSIS — R7303 Prediabetes: Secondary | ICD-10-CM | POA: Diagnosis not present

## 2021-10-19 DIAGNOSIS — Z0001 Encounter for general adult medical examination with abnormal findings: Secondary | ICD-10-CM | POA: Diagnosis not present

## 2021-10-19 DIAGNOSIS — N1831 Chronic kidney disease, stage 3a: Secondary | ICD-10-CM

## 2021-10-19 MED ORDER — MELOXICAM 15 MG PO TABS: ORAL_TABLET | ORAL | 1 refills | Status: DC

## 2021-10-19 NOTE — Progress Notes (Signed)
Patient ID: Suzanne Hansen, female   DOB: Mar 01, 1956, 66 y.o.   MRN: [id number]         Chief Complaint:: wellness exam and polyarthralgia, hyperglycemia, htn, anxiety       HPI:  Suzanne Hansen is a 66 y.o. female here for wellness exam; has cataract for surgury after 66yo., has optho f/u sched for April 2023.  Due for colonoscopy in mar 2023;  declines covid booster, flu shot, o/w up to date                        Also s/p repeat cortisone hip yesterday.  Saw Dr Alvan Dame as second opinion for possible knee surgury, recommended for wt oss and may not need knee surgury as soon.  Tomorrow starts with healthy wt and wellness.  Has f/u Dr Alvan Dame in 5 wks and possible MRI of hip if not better.  Denies worsening depressive symptoms, suicidal ideation, or panic;  Pt denies chest pain, increased sob or doe, wheezing, orthopnea, PND, increased LE swelling, palpitations, dizziness or syncope.   Pt denies polydipsia, polyuria, or new focal neuro s/s.   Pt denies fever, wt loss, night sweats, loss of appetite, or other constitutional symptoms    Wt Readings from Last 3 Encounters:  10/20/21 254 lb (115.2 kg)  10/19/21 259 lb (117.5 kg)  10/01/21 253 lb (114.8 kg)   BP Readings from Last 3 Encounters:  10/20/21 121/83  10/19/21 122/72  10/01/21 112/68   Immunization History  Administered Date(s) Administered   Influenza Split 08/01/2011   Influenza,inj,Quad PF,6+ Mos 07/01/2014, 08/28/2015, 08/09/2016, 08/22/2017, 06/19/2019, 06/29/2020   Influenza-Unspecified 06/19/2019   PFIZER(Purple Top)SARS-COV-2 Vaccination 12/06/2019, 12/25/2019, 08/28/2020   Pneumococcal Conjugate-13 02/16/2016   Pneumococcal Polysaccharide-23 09/27/2017   Td 04/09/2004   Tdap 02/16/2016   Zoster, Live 05/29/2015   There are no preventive care reminders to display for this patient.     Past Medical History:  Diagnosis Date   ALLERGIC RHINITIS 05/09/2008   Allergy    ANXIETY 05/23/2007   ARM PAIN, LEFT 10/23/2009   Arthritis     Asthma    ASTHMA 05/23/2007   Back pain    Cardiomyopathy ([place]) 09/08/2015   Chest pain    CHF (congestive heart failure) (H[facility])    Chronic diastolic heart failure ([place]) 01/22/2020   Constipation    CONTACT DERMATITIS 06/09/2009   Diabetes mellitus without complication ([place])    type II   Dyspnea    allergies-dust, cig smoke, rag weeds   ENDOMETRIOSIS NOS 05/23/2007   FACIAL PAIN 06/02/2010   Food allergy    FREQUENCY, URINARY 05/17/2010   GERD (gastroesophageal reflux disease)    GLUCOSE INTOLERANCE 08/28/2009   HERPES SIMPLEX, UNCOMPLICATED 1/76/1607   Hyperlipidemia 08/28/2015   HYPERTHYROIDISM 05/23/2007   Hypoactive thyroid    HYPOTHYROIDISM 05/09/2008   Impaired glucose tolerance 01/28/2011   INSOMNIA, HX OF 05/23/2007   Joint pain    LATERAL EPICONDYLITIS, RIGHT 03/10/2009   LIPOMA 10/22/2010   NECK PAIN 11/10/2010   OBESITY 05/23/2007   Osteopenia 09/28/2018   Plantar fasciitis    Both feet   Pre-diabetes    SHOULDER PAIN, LEFT 05/17/2010   SINUSITIS- ACUTE-NOS 11/03/2008   SKIN LESION 11/10/2010   Sleep apnea    Stage III chronic kidney disease ([place])    Stage III   Unspecified Chest Pain 07/25/2008   UNSPECIFIED URTICARIA 06/04/2009   URI 08/28/2009   UTI 04/29/2008   VITAMIN D DEFICIENCY 08/28/2009  Wheezing 07/12/2010   Past Surgical History:  Procedure Laterality Date   COLONOSCOPY     ENDOMETRIAL ABLATION     LAMINECTOMY  1996   LAPAROTOMY     exploratory   lower back surgery  07/06/2017   cyst   POLYPECTOMY     s/p endometrial ablation  12/06   TUBAL LIGATION     UTERINE FIBROID SURGERY     WISDOM TOOTH EXTRACTION      reports that she quit smoking about 33 years ago. Her smoking use included cigarettes. She has a 6.00 pack-year smoking history. She has never used smokeless tobacco. She reports that she does not drink alcohol and does not use drugs. family history includes COPD in her father; Cancer in her maternal grandmother; Depression in her mother;  Diabetes in her cousin and sister; Heart disease in her cousin and mother; Hypertension in her cousin and father; Kidney disease in her sister. Allergies  Allergen Reactions   Shellfish Allergy Anaphylaxis   Clarithromycin Nausea And Vomiting and Nausea Only   Latex Itching and Other (See Comments)    ITCHING AND COLD SORES AROUND MOUTH WHEN LATEX GLOVES USED BY THE DENTIST/HYGENTIST   Metronidazole Nausea And Vomiting and Nausea Only   Zostavax [Zoster Vaccine Live]     Arm swelled   Adhesive [Tape] Dermatitis    Plastic tape    Amiloride     Other reaction(s): foot numbness   Gadobenate    Lisinopril Cough   Other Dermatitis    Plastic tape    Peanut Oil     Pt says it's ok   Current Outpatient Medications on File Prior to Visit  Medication Sig Dispense Refill   allopurinol (ZYLOPRIM) 100 MG tablet Take 2 tablets (200 mg total) by mouth daily. Take 200 mg by mouth daily. 180 tablet 3   aspirin 81 MG EC tablet Take 81 mg by mouth daily.     estradiol (VIVELLE-DOT) 0.05 MG/24HR patch 1 patch 2 (two) times a week.     ezetimibe (ZETIA) 10 MG tablet Take 1 tablet (10 mg total) by mouth daily. 90 tablet 3   fexofenadine (ALLEGRA) 180 MG tablet Take 180 mg by mouth daily.     gabapentin (NEURONTIN) 100 MG capsule Take 1 capsule (100 mg total) by mouth 2 (two) times daily as needed. 180 capsule 2   KLOR-CON M20 20 MEQ tablet Take 1 tablet (20 mEq total) by mouth daily. OK TO TAKE AN EXTRA ON DAYS YOU TAKE EXTRA TORSEMIDE 180 tablet 3   levothyroxine (SYNTHROID) 137 MCG tablet Take 1 tablet (137 mcg total) by mouth daily before breakfast. 90 tablet 3   losartan (COZAAR) 25 MG tablet Take 12.5 mg by mouth daily.     predniSONE (DELTASONE) 10 MG tablet 3 tabs by mouth per day for 3 days,2tabs per day for 3 days,1tab per day for 3 days 18 tablet 0   progesterone (PROMETRIUM) 100 MG capsule Take 100 mg by mouth daily.     No current facility-administered medications on file prior to visit.         ROS:  All others reviewed and negative.  Objective        PE:  BP 122/72 (BP Location: Right Arm, Patient Position: Sitting, Cuff Size: Large)    Pulse 80    Temp 98.2 F (36.8 C) (Oral)    Ht 5\' 3"  (1.6 m)    Wt 259 lb (117.5 kg)    SpO2 100%  BMI 45.88 kg/m                 Constitutional: Pt appears in NAD               HENT: Head: NCAT.                Right Ear: External ear normal.                 Left Ear: External ear normal.                Eyes: . Pupils are equal, round, and reactive to light. Conjunctivae and EOM are normal               Nose: without d/c or deformity               Neck: Neck supple. Gross normal ROM               Cardiovascular: Normal rate and regular rhythm.                 Pulmonary/Chest: Effort normal and breath sounds without rales or wheezing.                Abd:  Soft, NT, ND, + BS, no organomegaly               Neurological: Pt is alert. At baseline orientation, motor grossly intact               Skin: Skin is warm. No rashes, no other new lesions, LE edema - none               Psychiatric: Pt behavior is normal without agitation   Micro: none  Cardiac tracings I have personally interpreted today:  none  Pertinent Radiological findings (summarize): none   Lab Results  Component Value Date   WBC 10.0 10/20/2021   HGB 13.5 10/20/2021   HCT 40.8 10/20/2021   PLT 330 10/20/2021   GLUCOSE 91 10/20/2021   CHOL 195 10/20/2021   TRIG 60 10/20/2021   HDL 102 10/20/2021   LDLCALC 82 10/20/2021   ALT 32 10/20/2021   AST 27 10/20/2021   NA 144 10/20/2021   K 3.9 10/20/2021   CL 103 10/20/2021   CREATININE 1.31 (H) 10/20/2021   BUN 25 10/20/2021   CO2 24 10/20/2021   TSH 6.440 (H) 10/20/2021   INR 1.0 03/21/2017   HGBA1C 5.7 (H) 10/20/2021   MICROALBUR <0.7 10/02/2019   Assessment/Plan:  Suzanne Hansen is a 66 y.o. Black or African American [2] female with  has a past medical history of ALLERGIC RHINITIS (05/09/2008), Allergy,  ANXIETY (05/23/2007), ARM PAIN, LEFT (10/23/2009), Arthritis, Asthma, ASTHMA (05/23/2007), Back pain, Cardiomyopathy ([place]) (09/08/2015), Chest pain, CHF (congestive heart failure) ([place]), Chronic diastolic heart failure ([place]) (01/22/2020), Constipation, CONTACT DERMATITIS (06/09/2009), Diabetes mellitus without complication ([place]), Dyspnea, ENDOMETRIOSIS NOS (05/23/2007), FACIAL PAIN (06/02/2010), Food allergy, FREQUENCY, URINARY (05/17/2010), GERD (gastroesophageal reflux disease), GLUCOSE INTOLERANCE (08/28/2009), HERPES SIMPLEX, UNCOMPLICATED (06/23/7828), Hyperlipidemia (08/28/2015), HYPERTHYROIDISM (05/23/2007), Hypoactive thyroid, HYPOTHYROIDISM (05/09/2008), Impaired glucose tolerance (01/28/2011), INSOMNIA, HX OF (05/23/2007), Joint pain, LATERAL EPICONDYLITIS, RIGHT (03/10/2009), LIPOMA (10/22/2010), NECK PAIN (11/10/2010), OBESITY (05/23/2007), Osteopenia (09/28/2018), Plantar fasciitis, Pre-diabetes, SHOULDER PAIN, LEFT (05/17/2010), SINUSITIS- ACUTE-NOS (11/03/2008), SKIN LESION (11/10/2010), Sleep apnea, Stage III chronic kidney disease ([place]), Unspecified Chest Pain (07/25/2008), UNSPECIFIED URTICARIA (06/04/2009), URI (08/28/2009), UTI (04/29/2008), VITAMIN D DEFICIENCY (08/28/2009), and Wheezing (07/12/2010).  Encounter for well adult exam with abnormal findings Age and  sex appropriate education and counseling updated with regular exercise and diet Referrals for preventative services - for colonoscopy Immunizations addressed - declines covid booster, flu shot Smoking counseling  - none needed Evidence for depression or other mood disorder - chronic anxiety stable Most recent labs reviewed. I have personally reviewed and have noted: 1) the patient's medical and social history 2) The patient's current medications and supplements 3) The patient's height, weight, and BMI have been recorded in the chart   Essential (primary) hypertension BP Readings from Last 3 Encounters:  10/20/21 121/83  10/19/21 122/72  10/01/21  112/68   Stable, pt to continue medical treatment losartan   Polyarthralgia With persistent pain, for mobic prn,  to f/u any worsening symptoms or concerns  Prediabetes Lab Results  Component Value Date   HGBA1C 5.7 (H) 10/20/2021   Stable, pt to continue current medical treatment   - diet   Stage 3 chronic kidney disease ([place]) Lab Results  Component Value Date   CREATININE 1.31 (H) 10/20/2021   Stable overall, cont to avoid nephrotoxins   Vitamin D deficiency Last vitamin D Lab Results  Component Value Date   VD25OH 26.2 (L) 10/20/2021   Low, to start oral replacement  Followup: Return in about 6 months (around 04/18/2022).  Cathlean Cower, MD 10/24/2021 6:14 AM [facility]

## 2021-10-19 NOTE — Patient Instructions (Signed)
Please take all new medication as prescribed - the anti-inflammatory  Please continue all other medications as before, and refills have been done if requested.  Please have the pharmacy call with any other refills you may need.  Please continue your efforts at being more active, low cholesterol diet, and weight control.  You are otherwise up to date with prevention measures today.  Please keep your appointments with your specialists as you may have planned  You will be contacted regarding the referral for: colonoscopy  Please make an Appointment to return in 6 months, or sooner if needed  Good Luck with the right Hip and Knees!

## 2021-10-20 ENCOUNTER — Other Ambulatory Visit (INDEPENDENT_AMBULATORY_CARE_PROVIDER_SITE_OTHER): Payer: Self-pay | Admitting: Bariatrics

## 2021-10-20 ENCOUNTER — Encounter (INDEPENDENT_AMBULATORY_CARE_PROVIDER_SITE_OTHER): Payer: Self-pay | Admitting: Bariatrics

## 2021-10-20 ENCOUNTER — Other Ambulatory Visit: Payer: Self-pay

## 2021-10-20 ENCOUNTER — Ambulatory Visit (INDEPENDENT_AMBULATORY_CARE_PROVIDER_SITE_OTHER): Payer: BC Managed Care – PPO | Admitting: Bariatrics

## 2021-10-20 VITALS — BP 121/83 | HR 85 | Temp 98.1°F | Ht 63.0 in | Wt 254.0 lb

## 2021-10-20 DIAGNOSIS — Z6841 Body Mass Index (BMI) 40.0 and over, adult: Secondary | ICD-10-CM

## 2021-10-20 DIAGNOSIS — E559 Vitamin D deficiency, unspecified: Secondary | ICD-10-CM

## 2021-10-20 DIAGNOSIS — R5383 Other fatigue: Secondary | ICD-10-CM

## 2021-10-20 DIAGNOSIS — E038 Other specified hypothyroidism: Secondary | ICD-10-CM

## 2021-10-20 DIAGNOSIS — E66813 Obesity, class 3: Secondary | ICD-10-CM

## 2021-10-20 DIAGNOSIS — G4733 Obstructive sleep apnea (adult) (pediatric): Secondary | ICD-10-CM

## 2021-10-20 DIAGNOSIS — Z1331 Encounter for screening for depression: Secondary | ICD-10-CM | POA: Diagnosis not present

## 2021-10-20 DIAGNOSIS — R7303 Prediabetes: Secondary | ICD-10-CM

## 2021-10-20 DIAGNOSIS — Z9989 Dependence on other enabling machines and devices: Secondary | ICD-10-CM

## 2021-10-20 DIAGNOSIS — E7849 Other hyperlipidemia: Secondary | ICD-10-CM | POA: Diagnosis not present

## 2021-10-20 DIAGNOSIS — I10 Essential (primary) hypertension: Secondary | ICD-10-CM

## 2021-10-20 DIAGNOSIS — R0602 Shortness of breath: Secondary | ICD-10-CM | POA: Diagnosis not present

## 2021-10-20 DIAGNOSIS — F5089 Other specified eating disorder: Secondary | ICD-10-CM

## 2021-10-20 DIAGNOSIS — E668 Other obesity: Secondary | ICD-10-CM

## 2021-10-20 DIAGNOSIS — K219 Gastro-esophageal reflux disease without esophagitis: Secondary | ICD-10-CM

## 2021-10-20 MED ORDER — CONTRAVE 8-90 MG PO TB12: ORAL_TABLET | ORAL | 0 refills | Status: DC

## 2021-10-20 NOTE — Progress Notes (Signed)
Chief Complaint:   OBESITY Suzanne Hansen (MR# [id number]) is a 66 y.o. female who presents for evaluation and treatment of obesity and related comorbidities. Current BMI is Body mass index is 44.99 kg/m. Suzanne Hansen has been struggling with her weight for many years and has been unsuccessful in either losing weight, maintaining weight loss, or reaching her healthy weight goal.  Suzanne Hansen is a returning patient to our family with her last visit back 10/31/2019. She had gained about 21 lbs since her last visit.   Suzanne Hansen is currently in the action stage of change and ready to dedicate time achieving and maintaining a healthier weight. Suzanne Hansen is interested in becoming our patient and working on intensive lifestyle modifications including (but not limited to) diet and exercise for weight loss.  Suzanne Hansen's habits were reviewed today and are as follows: her desired weight loss is 55-80 pounds, she started gaining weight after 3rd pregnancy and disc replacement in back in 2018. her heaviest weight ever was 255 pounds, she has significant food cravings issues, she snacks frequently in the evenings, she skips meals frequently, she is frequently drinking liquids with calories, she frequently eats larger portions than normal, she has binge eating behaviors, and she struggles with emotional eating.  Depression Screen Suzanne Hansen's Food and Mood (modified PHQ-9) score was 5.  Depression screen PHQ 2/9 10/20/2021  Decreased Interest 1  Down, Depressed, Hopeless 0  PHQ - 2 Score 1  Altered sleeping 0  Tired, decreased energy 2  Change in appetite 1  Feeling bad or failure about yourself  0  Trouble concentrating 0  Moving slowly or fidgety/restless 1  Suicidal thoughts 0  PHQ-9 Score 5  Difficult doing work/chores Not difficult at all  Some recent data might be hidden   Subjective:   1. Other fatigue Suzanne Hansen reports daytime somnolence and admits to waking up still tired. Patent has a history of  symptoms of morning fatigue. Suzanne Hansen generally gets 6 or 7 hours of sleep per night, and states that she has difficulty falling asleep. Snoring is present. Apneic episodes is present. Epworth Sleepiness Score is 6.   2. SOB (shortness of breath) on exertion Suzanne Hansen notes increasing shortness of breath with exercising and seems to be worsening over time with weight gain. She notes getting out of breath sooner with activity than she used to. This has not gotten worse recently. Suzanne Hansen denies shortness of breath at rest or orthopnea.   3. Vitamin D deficiency Suzanne Hansen is taking OTC Vitamin D 2,000 IU.  4. Other hyperlipidemia Suzanne Hansen is taking statin myopaty and taking Zetia.   5. Essential hypertension Suzanne Hansen's blood pressure is controlled. Her blood pressure today was 121/83.  6. OSA on CPAP Suzanne Hansen is currently using a CPAP machine.  7.Gastroesophageal reflux disease, unspecified whether esophagitis present  Suzanne Hansen notes symptoms or side effects depending on food timing.  8. Other specified hypothyroidism Suzanne Hansen is currently taking Levothyroxine.  9. Prediabetes Suzanne Hansen is not on medications currently.   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 She has been working on behavior modification techniques to help reduce her emotional eating. She shows no sign of suicidal or homicidal ideations.   Assessment/Plan:   1. Other fatigue Suzanne Hansen does not feel that her weight is causing her energy to be lower than it should be. Fatigue may be related to obesity, depression or many other causes. Labs will  be ordered, and in the meanwhile, Suzanne Hansen will focus on self care including making healthy food choices, increasing physical activity and focusing on stress reduction.  - CBC with Differential/Platelet - TSH+T4F+T3Free  2. SOB (shortness of breath) on  exertion Suzanne Hansen does not feel that she gets out of breath more easily that she used to when she exercises. Suzanne Hansen shortness of breath appears to be obesity related and exercise induced. She has agreed to work on weight loss and gradually increase exercise to treat her exercise induced shortness of breath. Will continue to monitor closely.  - CBC with Differential/Platelet - TSH+T4F+T3Free  3. Vitamin D deficiency Low Vitamin D level contributes to fatigue and are associated with obesity, breast, and colon cancer. We will check Vitamin D and Suzanne Hansen will follow-up for routine testing of Vitamin D, at least 2-3 times per year to avoid over-replacement.  - VITAMIN D 25 Hydroxy (Vit-D Deficiency, Fractures)  4. Other hyperlipidemia Suzanne Hansen will continue Zetia. We will check Lipids today. She will have no trans fats and she will decrease saturated fats. Cardiovascular risk and specific lipid/LDL goals reviewed.  We discussed several lifestyle modifications today and Suzanne Hansen will continue to work on diet, exercise and weight loss efforts. Orders and follow up as documented in patient record.   Counseling Intensive lifestyle modifications are the first line treatment for this issue. Dietary changes: Increase soluble fiber. Decrease simple carbohydrates. Exercise changes: Moderate to vigorous-intensity aerobic activity 150 minutes per week if tolerated. Lipid-lowering medications: see documented in medical record. - Lipid Panel With LDL/HDL Ratio  5. Essential hypertension Suzanne Hansen will continue her medications. She is working on healthy weight loss and exercise to improve blood pressure control. We will watch for signs of hypotension as she continues her lifestyle modifications. We will check CMP today.   - Comprehensive metabolic panel  6. OSA on CPAP Suzanne Hansen will continue to use her CPAP machine nightly. Intensive lifestyle modifications are the first line treatment for this issue. We  discussed several lifestyle modifications today and she will continue to work on diet, exercise and weight loss efforts. We will continue to monitor. Orders and follow up as documented in patient record.    7. Gastroesophageal reflux disease, unspecified whether esophagitis present Intensive lifestyle modifications are the first line treatment for this issue. We discussed several lifestyle modifications today and she will continue to work on diet, exercise and weight loss efforts. Suzanne Hansen will continue her medications. Orders and follow up as documented in patient record.   Counseling If a person has gastroesophageal reflux disease (GERD), food and stomach acid move back up into the esophagus and cause symptoms or problems such as damage to the esophagus. Anti-reflux measures include: raising the head of the bed, avoiding tight clothing or belts, avoiding eating late at night, not lying down shortly after mealtime, and achieving weight loss. Avoid ASA, NSAID's, caffeine, alcohol, and tobacco.  OTC Pepcid and/or Tums are often very helpful for as needed use.  However, for persisting chronic or daily symptoms, stronger medications like Omeprazole may be needed. You may need to avoid foods and drinks such as: Coffee and tea (with or without caffeine). Drinks that contain alcohol. Energy drinks and sports drinks. Bubbly (carbonated) drinks or sodas. Chocolate and cocoa. Peppermint and mint flavorings. Garlic and onions. Horseradish. Spicy and acidic foods. These include peppers, chili powder, curry powder, vinegar, hot sauces, and BBQ sauce. Citrus fruit juices and citrus fruits, such as oranges, lemons, and limes. Tomato-based foods. These include red  sauce, chili, salsa, and pizza with red sauce. Fried and fatty foods. These include donuts, french fries, potato chips, and high-fat dressings. High-fat meats. These include hot dogs, rib eye steak, sausage, ham, and bacon.   8. Other specified  hypothyroidism Orders and follow up as documented in patient record.  Counseling Good thyroid control is important for overall health. Supratherapeutic thyroid levels are dangerous and will not improve weight loss results. Counseling: The correct way to take levothyroxine is fasting, with water, separated by at least 30 minutes from breakfast, and separated by more than 4 hours from calcium, iron, multivitamins, acid reflux medications (PPIs).    9. Prediabetes We will check  insulin and A1C today. Aradia will continue to work on weight loss, exercise, and decreasing simple carbohydrates to help decrease the risk of diabetes.  - Insulin, random - Hemoglobin A1c  10. Other disorder of eating Suzanne Hansen will continue Contrave 8/90 mg taper up with no refills. Behavior modification techniques were discussed today to help Elliona deal with her emotional/non-hunger eating behaviors.  Orders and follow up as documented in patient record.    11. Depression screen Suzanne Hansen had a negative depression screening. Depression is commonly associated with obesity and often results in emotional eating behaviors. We will monitor this closely and work on CBT to help improve the non-hunger eating patterns. Referral to Psychology may be required if no improvement is seen as she continues in our clinic.   12. Class 3 severe obesity with serious comorbidity and body mass index (BMI) of 45.0 to 49.9 in adult, unspecified obesity type (HCC) Suzanne Hansen is currently in the action stage of change and her goal is to continue with weight loss efforts. I recommend Suzanne Hansen begin the structured treatment plan as follows:  She has agreed to the Category 2 Plan.  Exercise goals: Suzanne Hansen will continue meal planning. We reviewed labs from 06/2021 BMP, glucose.  02/22/2021 THS, T4. She will have no sugary drinks.    Behavioral modification strategies: increasing lean protein intake, decreasing simple carbohydrates, increasing  vegetables, increasing water intake, decreasing eating out, no skipping meals, meal planning and cooking strategies, keeping healthy foods in the home, and planning for success.  She was informed of the importance of frequent follow-up visits to maximize her success with intensive lifestyle modifications for her multiple health conditions. She was informed we would discuss her lab results at her next visit unless there is a critical issue that needs to be addressed sooner. Raymona agreed to keep her next visit at the agreed upon time to discuss these results.  Objective:   Blood pressure 121/83, pulse 85, temperature 98.1 F (36.7 C), height 5\' 3"  (1.6 m), weight 254 lb (115.2 kg). Body mass index is 44.99 kg/m.  EKG: Normal sinus rhythm, rate 90.  Indirect Calorimeter completed today shows a VO2 of 270 and a REE of 1858.  Her calculated basal metabolic rate is 2536 thus her basal metabolic rate is better than expected.  General: Cooperative, alert, well developed, in no acute distress. HEENT: Conjunctivae and lids unremarkable. Cardiovascular: Regular rhythm.  Lungs: Normal work of breathing. Neurologic: No focal deficits.   Lab Results  Component Value Date   CREATININE 1.30 (H) 09/27/2021   BUN 22 09/27/2021   NA 143 09/27/2021   K 3.8 09/27/2021   CL 98 09/27/2021   CO2 22 09/27/2021   Lab Results  Component Value Date   ALT 18 10/06/2020   AST 19 10/06/2020   ALKPHOS 96 10/06/2020  BILITOT 0.4 10/06/2020   Lab Results  Component Value Date   HGBA1C 5.5 10/06/2020   HGBA1C 5.8 10/02/2019   HGBA1C 5.7 (H) 07/18/2019   HGBA1C 5.6 09/28/2018   HGBA1C 5.6 08/09/2018   Lab Results  Component Value Date   INSULIN 20.7 07/18/2019   INSULIN 17.9 08/09/2018   Lab Results  Component Value Date   TSH 1.50 02/22/2021   Lab Results  Component Value Date   CHOL 204 (H) 10/06/2020   HDL 72.50 10/06/2020   LDLCALC 107 (H) 10/06/2020   TRIG 125.0 10/06/2020   CHOLHDL 3  10/06/2020   Lab Results  Component Value Date   WBC 6.8 10/06/2020   HGB 13.4 10/06/2020   HCT 41.0 10/06/2020   MCV 91.4 10/06/2020   PLT 287.0 10/06/2020   Lab Results  Component Value Date   IRON 44 10/02/2019   Attestation Statements:   Reviewed by clinician on day of visit: allergies, medications, problem list, medical history, surgical history, family history, social history, and previous encounter notes.  I, Lizbeth Bark, RMA, am acting as Location manager for [employer], DO.  I have reviewed the above documentation for accuracy and completeness, and I agree with the above. Jearld Lesch, DO

## 2021-10-21 ENCOUNTER — Telehealth: Payer: Self-pay

## 2021-10-21 LAB — CBC WITH DIFFERENTIAL/PLATELET
Basophils Absolute: 0.1 10*3/uL (ref 0.0–0.2)
Basos: 1 %
EOS (ABSOLUTE): 0.1 10*3/uL (ref 0.0–0.4)
Eos: 1 %
Hematocrit: 40.8 % (ref 34.0–46.6)
Hemoglobin: 13.5 g/dL (ref 11.1–15.9)
Immature Grans (Abs): 0 10*3/uL (ref 0.0–0.1)
Immature Granulocytes: 0 %
Lymphocytes Absolute: 3 10*3/uL (ref 0.7–3.1)
Lymphs: 30 %
MCH: 29.7 pg (ref 26.6–33.0)
MCHC: 33.1 g/dL (ref 31.5–35.7)
MCV: 90 fL (ref 79–97)
Monocytes Absolute: 0.7 10*3/uL (ref 0.1–0.9)
Monocytes: 7 %
Neutrophils Absolute: 6.2 10*3/uL (ref 1.4–7.0)
Neutrophils: 61 %
Platelets: 330 10*3/uL (ref 150–450)
RBC: 4.54 x10E6/uL (ref 3.77–5.28)
RDW: 13.9 % (ref 11.7–15.4)
WBC: 10 10*3/uL (ref 3.4–10.8)

## 2021-10-21 LAB — COMPREHENSIVE METABOLIC PANEL
ALT: 32 IU/L (ref 0–32)
AST: 27 IU/L (ref 0–40)
Albumin/Globulin Ratio: 2.3 — ABNORMAL HIGH (ref 1.2–2.2)
Albumin: 4.5 g/dL (ref 3.8–4.8)
Alkaline Phosphatase: 84 IU/L (ref 44–121)
BUN/Creatinine Ratio: 19 (ref 12–28)
BUN: 25 mg/dL (ref 8–27)
Bilirubin Total: 0.5 mg/dL (ref 0.0–1.2)
CO2: 24 mmol/L (ref 20–29)
Calcium: 9.7 mg/dL (ref 8.7–10.3)
Chloride: 103 mmol/L (ref 96–106)
Creatinine, Ser: 1.31 mg/dL — ABNORMAL HIGH (ref 0.57–1.00)
Globulin, Total: 2 g/dL (ref 1.5–4.5)
Glucose: 91 mg/dL (ref 70–99)
Potassium: 3.9 mmol/L (ref 3.5–5.2)
Sodium: 144 mmol/L (ref 134–144)
Total Protein: 6.5 g/dL (ref 6.0–8.5)
eGFR: 45 mL/min/{1.73_m2} — ABNORMAL LOW (ref >59)

## 2021-10-21 LAB — TSH+T4F+T3FREE
Free T4: 1.23 ng/dL (ref 0.82–1.77)
T3, Free: 2.1 pg/mL (ref 2.0–4.4)
TSH: 6.44 u[IU]/mL — ABNORMAL HIGH (ref 0.450–4.500)

## 2021-10-21 LAB — INSULIN, RANDOM: INSULIN: 14.7 u[IU]/mL (ref 2.6–24.9)

## 2021-10-21 LAB — LIPID PANEL WITH LDL/HDL RATIO
Cholesterol, Total: 195 mg/dL (ref 100–199)
HDL: 102 mg/dL (ref >39)
LDL Chol Calc (NIH): 82 mg/dL (ref 0–99)
LDL/HDL Ratio: 0.8 ratio (ref 0.0–3.2)
Triglycerides: 60 mg/dL (ref 0–149)
VLDL Cholesterol Cal: 11 mg/dL (ref 5–40)

## 2021-10-21 LAB — VITAMIN D 25 HYDROXY (VIT D DEFICIENCY, FRACTURES): Vit D, 25-Hydroxy: 26.2 ng/mL — ABNORMAL LOW (ref 30.0–100.0)

## 2021-10-21 LAB — HEMOGLOBIN A1C
Est. average glucose Bld gHb Est-mCnc: 117 mg/dL
Hgb A1c MFr Bld: 5.7 % — ABNORMAL HIGH (ref 4.8–5.6)

## 2021-10-21 NOTE — Telephone Encounter (Signed)
Please review

## 2021-10-21 NOTE — Telephone Encounter (Signed)
Patient notified

## 2021-10-21 NOTE — Telephone Encounter (Signed)
Ok to leave thyroid alone for now,  Please take OTC Vitamin D3 at 2000 units per day, indefinitely, or 4000 units if you already take the 2000 units.

## 2021-10-21 NOTE — Telephone Encounter (Signed)
Pt calling in to let Dr. Jenny Reichmann know that she had her labs done with [facility]. Some of the lab results were abnormal such Thyroid was high and Vit D was low and want to know what your recommendation are for these values.  Please advise pt [phone]

## 2021-10-23 DIAGNOSIS — M7061 Trochanteric bursitis, right hip: Secondary | ICD-10-CM | POA: Insufficient documentation

## 2021-10-24 ENCOUNTER — Encounter: Payer: Self-pay | Admitting: Internal Medicine

## 2021-10-24 IMAGING — DX DG KNEE AP/LAT W/ SUNRISE*L*
3 series · 3 of 3 positions shown · non-contrast
Comparison: 06/09/2020.

CLINICAL DATA: Generalized knee pain.  No known injury.

EXAM:
LEFT KNEE 3 VIEWS

[knee ap]
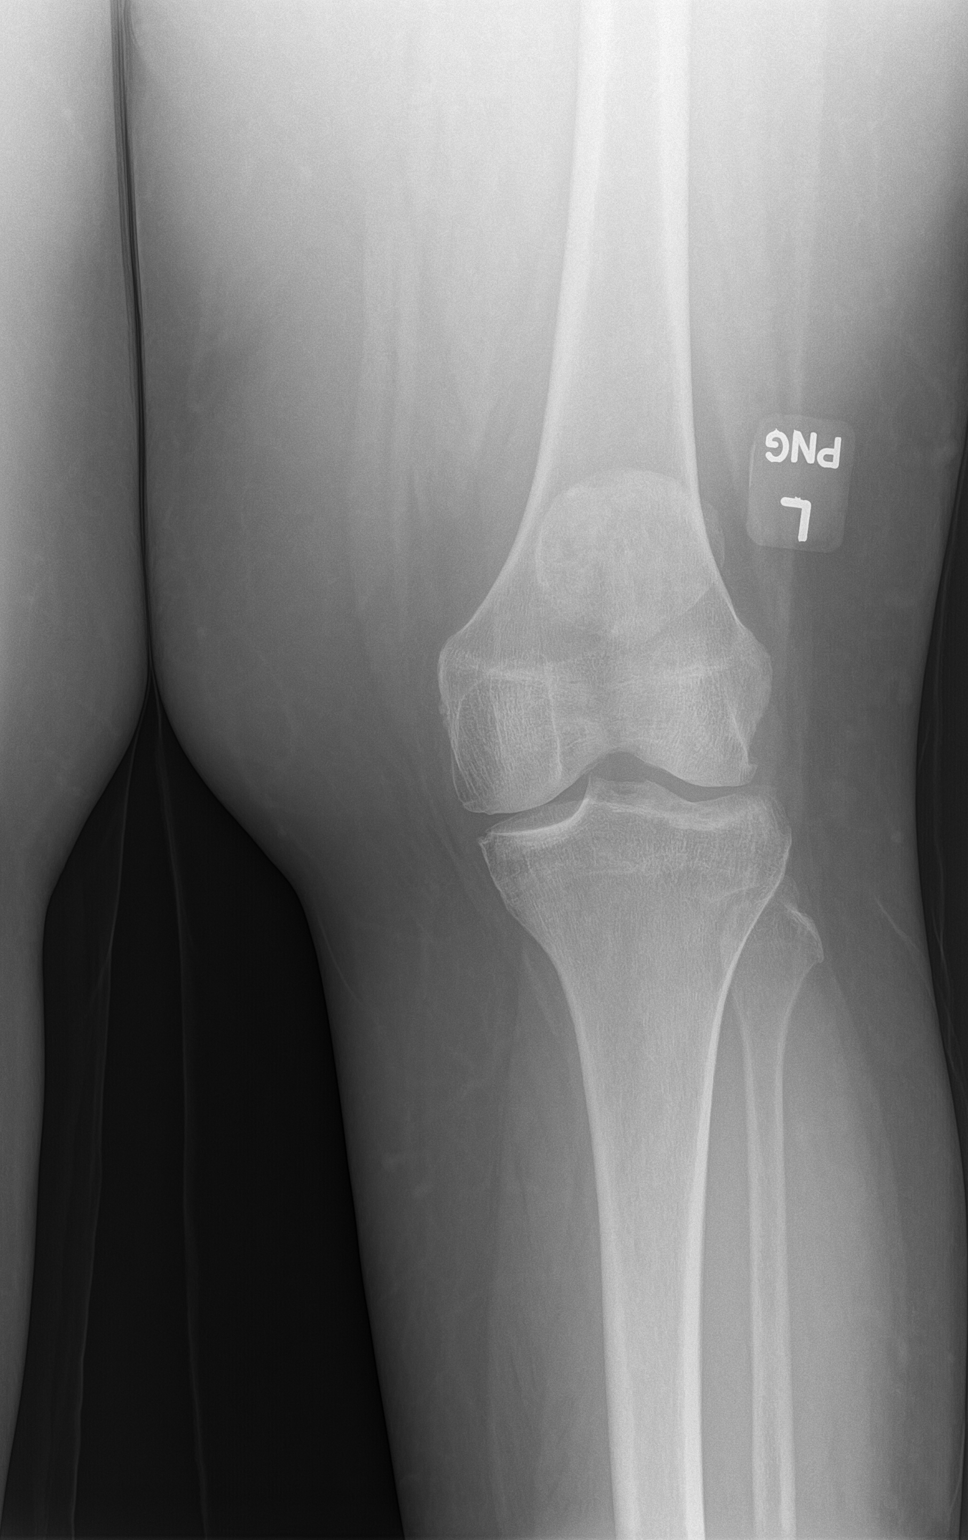

[knee lat]
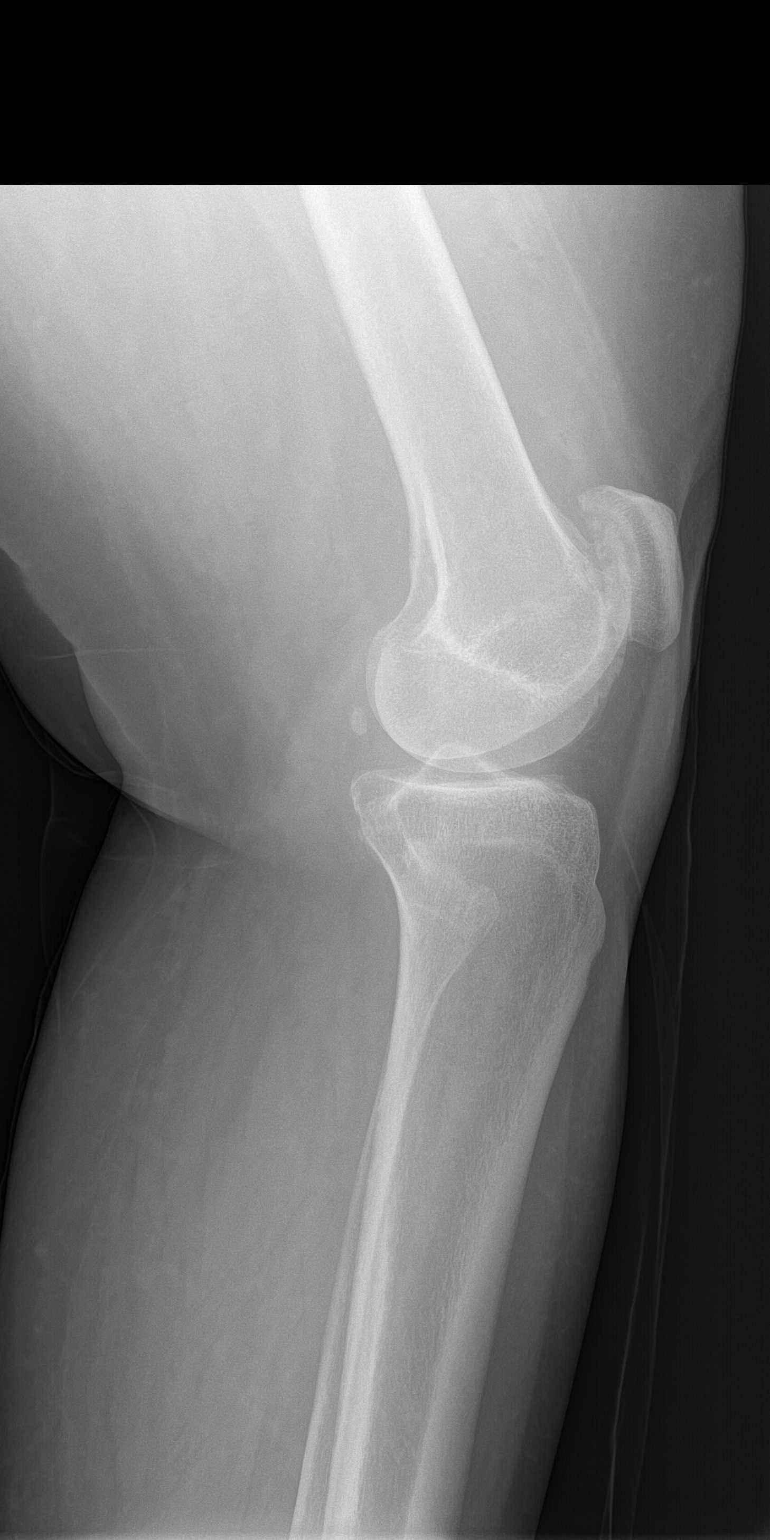

[patella]
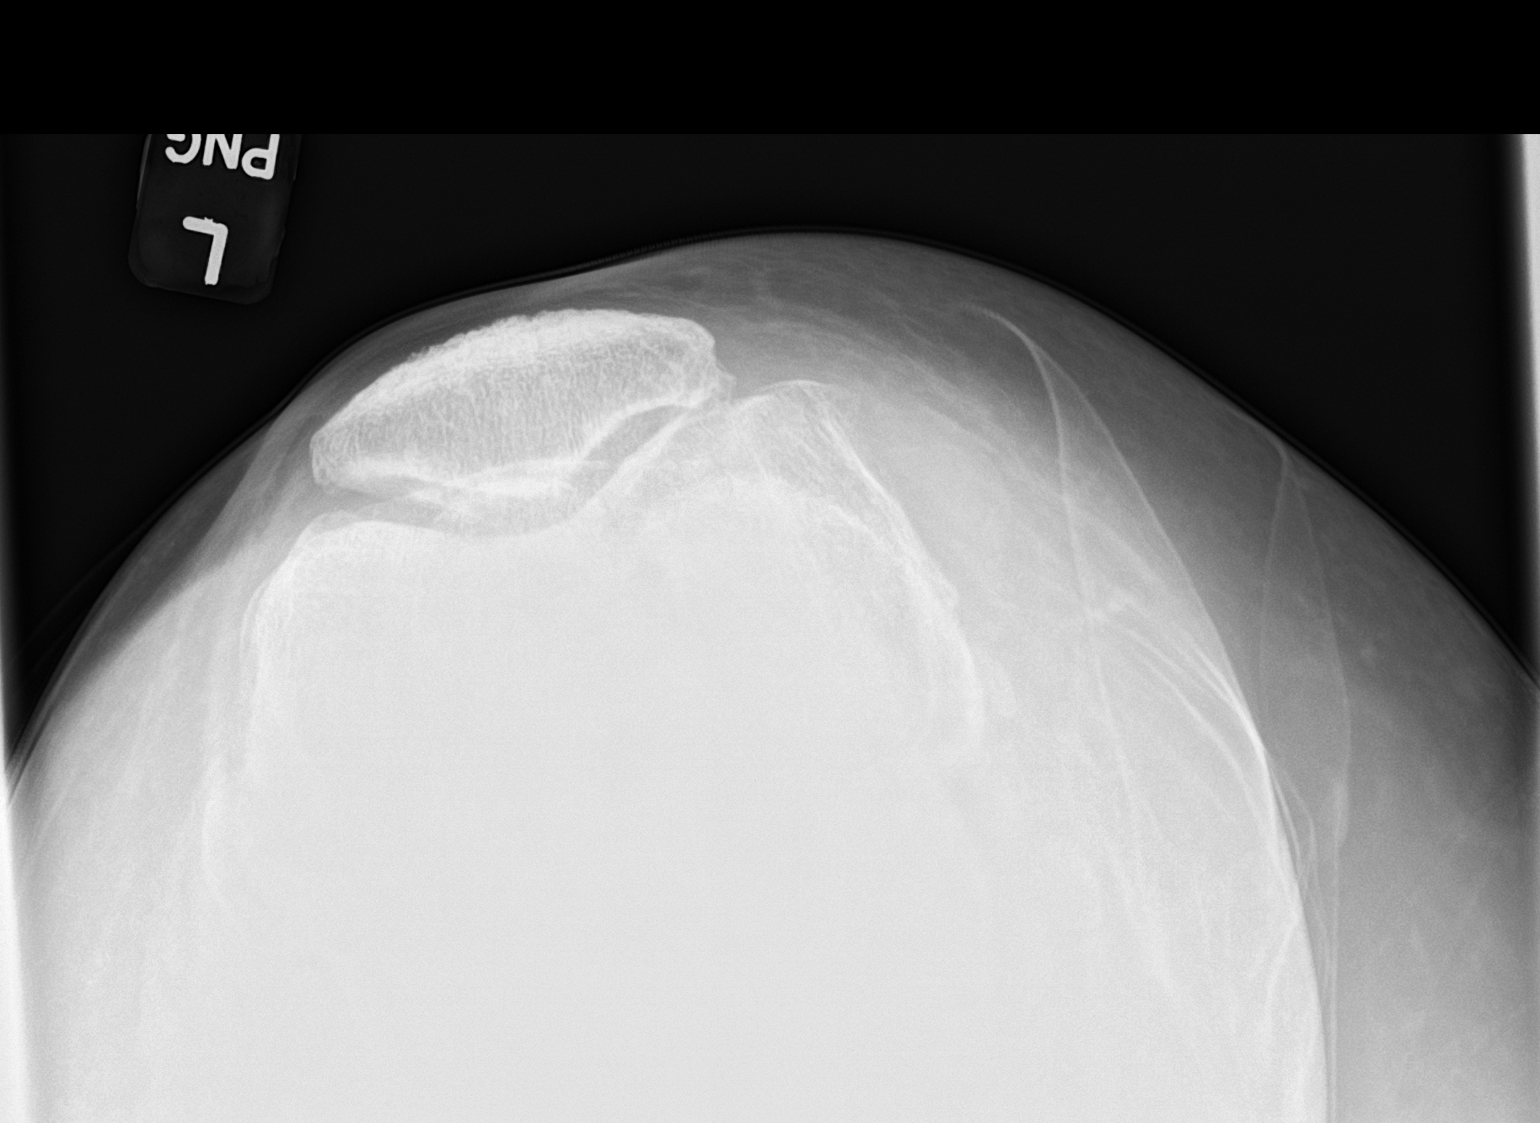

[3 of 3 positions shown; findings below may reference images not displayed]

FINDINGS: Tricompartment degenerative change again noted. No acute bony
abnormality identified. No evidence of effusion.
IMPRESSION: Tricompartment degenerative change again noted. No acute
abnormality. Exam stable from prior exam.

## 2021-10-24 NOTE — Assessment & Plan Note (Signed)
Lab Results  Component Value Date   HGBA1C 5.7 (H) 10/20/2021   Stable, pt to continue current medical treatment   - diet

## 2021-10-24 NOTE — Assessment & Plan Note (Signed)
Lab Results  Component Value Date   CREATININE 1.31 (H) 10/20/2021   Stable overall, cont to avoid nephrotoxins

## 2021-10-24 NOTE — Assessment & Plan Note (Signed)
With persistent pain, for mobic prn,  to f/u any worsening symptoms or concerns

## 2021-10-24 NOTE — Assessment & Plan Note (Signed)
BP Readings from Last 3 Encounters:  10/20/21 121/83  10/19/21 122/72  10/01/21 112/68   Stable, pt to continue medical treatment losartan

## 2021-10-24 NOTE — Assessment & Plan Note (Addendum)
Age and sex appropriate education and counseling updated with regular exercise and diet Referrals for preventative services - for colonoscopy Immunizations addressed - declines covid booster, flu shot Smoking counseling  - none needed Evidence for depression or other mood disorder - chronic anxiety stable Most recent labs reviewed.  Declines further lab today I have personally reviewed and have noted: 1) the patient's medical and social history 2) The patient's current medications and supplements 3) The patient's height, weight, and BMI have been recorded in the chart

## 2021-10-24 NOTE — Assessment & Plan Note (Signed)
Last vitamin D Lab Results  Component Value Date   VD25OH 26.2 (L) 10/20/2021   Low, to start oral replacement

## 2021-10-26 ENCOUNTER — Other Ambulatory Visit: Payer: Self-pay | Admitting: Internal Medicine

## 2021-10-26 ENCOUNTER — Telehealth (INDEPENDENT_AMBULATORY_CARE_PROVIDER_SITE_OTHER): Payer: Self-pay | Admitting: Bariatrics

## 2021-10-26 ENCOUNTER — Encounter (INDEPENDENT_AMBULATORY_CARE_PROVIDER_SITE_OTHER): Payer: Self-pay | Admitting: Bariatrics

## 2021-10-26 NOTE — Telephone Encounter (Signed)
Patient states that her insurance will not cover Contrave prescribed by Dr. Owens Shark. Pt would like a replacement medication if possible. Please call pt at the number listed. Thank You!       [facility].

## 2021-10-27 DIAGNOSIS — R102 Pelvic and perineal pain: Secondary | ICD-10-CM | POA: Diagnosis not present

## 2021-10-27 DIAGNOSIS — R1031 Right lower quadrant pain: Secondary | ICD-10-CM | POA: Diagnosis not present

## 2021-10-27 NOTE — Telephone Encounter (Signed)
Pt last seen by Dr. Brown.  

## 2021-10-27 NOTE — Telephone Encounter (Signed)
Please review

## 2021-11-02 ENCOUNTER — Ambulatory Visit (INDEPENDENT_AMBULATORY_CARE_PROVIDER_SITE_OTHER): Payer: BC Managed Care – PPO | Admitting: Bariatrics

## 2021-11-02 ENCOUNTER — Encounter (INDEPENDENT_AMBULATORY_CARE_PROVIDER_SITE_OTHER): Payer: Self-pay

## 2021-11-03 ENCOUNTER — Ambulatory Visit (INDEPENDENT_AMBULATORY_CARE_PROVIDER_SITE_OTHER): Payer: BC Managed Care – PPO | Admitting: Bariatrics

## 2021-11-03 ENCOUNTER — Other Ambulatory Visit (INDEPENDENT_AMBULATORY_CARE_PROVIDER_SITE_OTHER): Payer: Self-pay | Admitting: Bariatrics

## 2021-11-03 ENCOUNTER — Encounter (INDEPENDENT_AMBULATORY_CARE_PROVIDER_SITE_OTHER): Payer: Self-pay | Admitting: Bariatrics

## 2021-11-03 ENCOUNTER — Other Ambulatory Visit: Payer: Self-pay

## 2021-11-03 VITALS — BP 130/82 | HR 80 | Temp 97.7°F | Ht 63.0 in | Wt 252.0 lb

## 2021-11-03 DIAGNOSIS — E559 Vitamin D deficiency, unspecified: Secondary | ICD-10-CM | POA: Diagnosis not present

## 2021-11-03 DIAGNOSIS — Z6841 Body Mass Index (BMI) 40.0 and over, adult: Secondary | ICD-10-CM

## 2021-11-03 DIAGNOSIS — E669 Obesity, unspecified: Secondary | ICD-10-CM | POA: Diagnosis not present

## 2021-11-03 DIAGNOSIS — R7303 Prediabetes: Secondary | ICD-10-CM

## 2021-11-03 DIAGNOSIS — R7989 Other specified abnormal findings of blood chemistry: Secondary | ICD-10-CM | POA: Diagnosis not present

## 2021-11-03 MED ORDER — SEMAGLUTIDE-WEIGHT MANAGEMENT 0.25 MG/0.5ML ~~LOC~~ SOAJ: 0.2500 mg | SUBCUTANEOUS | 0 refills | Status: DC

## 2021-11-03 MED ORDER — VITAMIN D (ERGOCALCIFEROL) 1.25 MG (50000 UNIT) PO CAPS: 50000.0000 [IU] | ORAL_CAPSULE | ORAL | 0 refills | Status: DC

## 2021-11-04 ENCOUNTER — Encounter (INDEPENDENT_AMBULATORY_CARE_PROVIDER_SITE_OTHER): Payer: Self-pay | Admitting: Bariatrics

## 2021-11-04 NOTE — Progress Notes (Signed)
Chief Complaint:   OBESITY Suzanne Hansen is here to discuss her progress with her obesity treatment plan along with follow-up of her obesity related diagnoses. Suzanne Hansen is on the Category 1 Plan and the Category 2 Plan and states she is following her eating plan approximately 60% of the time. Suzanne Hansen states she is doing 0 minutes 0 times per week.  Today's visit was #: 2 Starting weight: 254 lbs Starting date: 10/31/2021 Today's weight: 252 lbs Today's date: 11/03/2021 Total lbs lost to date: 2 Total lbs lost since last in-office visit: 2  Interim History: Suzanne Hansen is down 2 lbs since her first visit.  Subjective:   1. Vitamin D deficiency Suzanne Hansen is currently taking high dose Vitamin D. Her last Vitamin D level was 26.2.  2. Pre-diabetes Suzanne Hansen tried Metformin. Her last A1C level was 5.7.  3. Elevated TSH Suzanne Hansen is taking Synthroid currently.   Assessment/Plan:   1. Vitamin D deficiency Low Vitamin D level contributes to fatigue and are associated with obesity, breast, and colon cancer. We will refill  prescription Vitamin D 50,000 IU every week for 1 month and Suzanne Hansen will follow-up for routine testing of Vitamin D, at least 2-3 times per year to avoid over-replacement.  - Vitamin D, Ergocalciferol, (DRISDOL) 1.25 MG (50000 UNIT) CAPS capsule; Take 1 capsule (50,000 Units total) by mouth every 7 (seven) days.  Dispense: 4 capsule; Refill: 0  2. Pre-diabetes We will refill Wegovy 0.25 mg for 1 month with no refills. Suzanne Hansen will continue to work on weight loss, exercise, and decreasing simple carbohydrates to help decrease the risk of diabetes. She needs prior authorization for [facility].   - Semaglutide-Weight Management 0.25 MG/0.5ML SOAJ; Inject 0.25 mg into the skin once a week.  Dispense: 2 mL; Refill: 0  3. Elevated TSH We will recheck TSH in 6 weeks. Orders and follow up as documented in patient record.  Counseling Good thyroid control is important for overall  health. Supratherapeutic thyroid levels are dangerous and will not improve weight loss results. Counseling: The correct way to take levothyroxine is fasting, with water, separated by at least 30 minutes from breakfast, and separated by more than 4 hours from calcium, iron, multivitamins, acid reflux medications (PPIs).    4. Obesity, current BMI 44.8 Suzanne Hansen is currently in the action stage of change. As such, her goal is to continue with weight loss efforts. She has agreed to the Category 1 Plan and the Category 2 Plan.   Suzanne Hansen will continue meal planning. She will keep water intake high. We reviewed labs from 10/20/2021 CMP, Lipids and Vitamin D.  Exercise goals: No exercise has been prescribed at this time.  Behavioral modification strategies: increasing lean protein intake, decreasing simple carbohydrates, increasing vegetables, increasing water intake, decreasing eating out, no skipping meals, meal planning and cooking strategies, keeping healthy foods in the home, and planning for success.  Suzanne Hansen has agreed to follow-up with our clinic in 2 weeks. She was informed of the importance of frequent follow-up visits to maximize her success with intensive lifestyle modifications for her multiple health conditions.   Objective:   Blood pressure 130/82, pulse 80, temperature 97.7 F (36.5 C), height 5\' 3"  (1.6 m), weight 252 lb (114.3 kg), SpO2 95 %. Body mass index is 44.64 kg/m.  General: Cooperative, alert, well developed, in no acute distress. HEENT: Conjunctivae and lids unremarkable. Cardiovascular: Regular rhythm.  Lungs: Normal work of breathing. Neurologic: No focal deficits.   Lab Results  Component Value Date  CREATININE 1.31 (H) 10/20/2021   BUN 25 10/20/2021   NA 144 10/20/2021   K 3.9 10/20/2021   CL 103 10/20/2021   CO2 24 10/20/2021   Lab Results  Component Value Date   ALT 32 10/20/2021   AST 27 10/20/2021   ALKPHOS 84 10/20/2021   BILITOT 0.5 10/20/2021    Lab Results  Component Value Date   HGBA1C 5.7 (H) 10/20/2021   HGBA1C 5.5 10/06/2020   HGBA1C 5.8 10/02/2019   HGBA1C 5.7 (H) 07/18/2019   HGBA1C 5.6 09/28/2018   Lab Results  Component Value Date   INSULIN 14.7 10/20/2021   INSULIN 20.7 07/18/2019   INSULIN 17.9 08/09/2018   Lab Results  Component Value Date   TSH 6.440 (H) 10/20/2021   Lab Results  Component Value Date   CHOL 195 10/20/2021   HDL 102 10/20/2021   LDLCALC 82 10/20/2021   TRIG 60 10/20/2021   CHOLHDL 3 10/06/2020   Lab Results  Component Value Date   VD25OH 26.2 (L) 10/20/2021   VD25OH 27.53 (L) 10/06/2020   VD25OH 28.06 (L) 10/02/2019   Lab Results  Component Value Date   WBC 10.0 10/20/2021   HGB 13.5 10/20/2021   HCT 40.8 10/20/2021   MCV 90 10/20/2021   PLT 330 10/20/2021   Lab Results  Component Value Date   IRON 44 10/02/2019   Attestation Statements:   Reviewed by clinician on day of visit: allergies, medications, problem list, medical history, surgical history, family history, social history, and previous encounter notes.  I, Lizbeth Bark, RMA, am acting as Location manager for [employer], DO.  I have reviewed the above documentation for accuracy and completeness, and I agree with the above. Jearld Lesch, DO

## 2021-11-05 ENCOUNTER — Institutional Professional Consult (permissible substitution): Payer: BC Managed Care – PPO | Admitting: Pulmonary Disease

## 2021-11-07 ENCOUNTER — Other Ambulatory Visit: Payer: Self-pay | Admitting: Internal Medicine

## 2021-11-11 ENCOUNTER — Other Ambulatory Visit: Payer: Self-pay | Admitting: Internal Medicine

## 2021-11-11 NOTE — Telephone Encounter (Signed)
Please refill as per office routine med refill policy (all routine meds to be refilled for 3 mo or monthly (per pt preference) up to one year from last visit, then month to month grace period for 3 mo, then further med refills will have to be denied) ? ?

## 2021-11-12 ENCOUNTER — Encounter (HOSPITAL_BASED_OUTPATIENT_CLINIC_OR_DEPARTMENT_OTHER): Payer: Self-pay | Admitting: Cardiovascular Disease

## 2021-11-12 ENCOUNTER — Ambulatory Visit (HOSPITAL_BASED_OUTPATIENT_CLINIC_OR_DEPARTMENT_OTHER): Payer: BC Managed Care – PPO | Admitting: Cardiovascular Disease

## 2021-11-12 ENCOUNTER — Other Ambulatory Visit: Payer: Self-pay

## 2021-11-12 VITALS — BP 114/70 | HR 94 | Ht 63.0 in | Wt 252.6 lb

## 2021-11-12 DIAGNOSIS — I5032 Chronic diastolic (congestive) heart failure: Secondary | ICD-10-CM | POA: Diagnosis not present

## 2021-11-12 DIAGNOSIS — I10 Essential (primary) hypertension: Secondary | ICD-10-CM | POA: Diagnosis not present

## 2021-11-12 DIAGNOSIS — R079 Chest pain, unspecified: Secondary | ICD-10-CM | POA: Diagnosis not present

## 2021-11-12 DIAGNOSIS — I480 Paroxysmal atrial fibrillation: Secondary | ICD-10-CM

## 2021-11-12 DIAGNOSIS — G4733 Obstructive sleep apnea (adult) (pediatric): Secondary | ICD-10-CM

## 2021-11-12 DIAGNOSIS — Z9989 Dependence on other enabling machines and devices: Secondary | ICD-10-CM

## 2021-11-12 MED ORDER — METOPROLOL TARTRATE 100 MG PO TABS: ORAL_TABLET | ORAL | 0 refills | Status: DC

## 2021-11-12 MED ORDER — PREDNISONE 50 MG PO TABS: ORAL_TABLET | ORAL | 0 refills | Status: DC

## 2021-11-12 NOTE — Assessment & Plan Note (Signed)
Blood pressure is well-controlled on losartan.  

## 2021-11-12 NOTE — Progress Notes (Signed)
Cardiology Office Note   Date:  11/12/2021   ID:  Suzanne Hansen, Suzanne Hansen 01-11-1956, MRN [id number]  PCP:  Biagio Borg, MD  Cardiologist:   Skeet Latch, MD   Chief Complaint  Patient presents with   Chest Pain    Marcha Solders, pt fell sharp pain in jaw this morning      History of Present Illness: Suzanne Hansen is a 66 y.o. 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  She was first seen in 2016 when she is diagnosed with acute systolic and diastolic heart failure.  Prior to that she had a stress Myoview 09/2013 that revealed LVEF 48% and no ischemia.  She was followed by her primary care physician and switch from hydrochlorothiazide to Lasix.  She had no symptoms at the time she first saw Dr. Angelena Form in 2016.  She was started on carvedilol and lisinopril.    Lisinopril was subsequently converted to losartan due to cough.  Her symptoms were felt to be nonischemic.  She notes that she was under a lot of stress at the time.  She subsequently transferred her care to [facility] with Dr. Kirtland Bouchard.  He underwent CPX testing in 2018 and was found to have moderate cardiac limitation and deconditioning.  Echo 01/2017 revealed LVEF 50-55%.  Her most recent echo 05/2018 revealed LVEF 55 to 60% with grade 1 diastolic dysfunction.  She reportedly has a history of atrial fibrillation that occurred in the setting of her initial heart failure symptoms.  She has not had any recurrence and has never been on anticoagulation.  She was symptomatic in atrial fibrillation.  Suzanne Hansen had a spinal fusion in December 2020.  Her back pain has been better but still troubles her at times.  It makes it difficult for her to exercise.  After her surgery she had some issues with bladder incontinence and lowering the dose of her diuretic seems to help this  as well.  She last saw Dr. Jenny Reichmann on 10/15/2020 and was started on Zetia.  She hasn't picked it up yet.  She wasn't tolerating statins due to muscle pain.  She didn't tolerate it even every other day.  At her last appointment, she was doing well. Since her last appointment, she was seen by cardiology at [facility] 05/2021 for evaluation of LE edema. She also reported exertional dyspnea and was unsure if it was asthma or heart failure.She had an echo which revealed an LVEF 60-65%. Her atrial pressure was 12mmHg. She established care with a new cardiologist 06/2021 and spironolactone was added. He thought her swelling was more related to lymphedema than heart failure. BNP was not elevated.  At her last appointment she was feeling well.  Her weight was fluctuating which she attributed to fluid retention.  This was in the setting of being on steroids.  We discussed her diastolic dysfunction.  We discussed continuing her torsemide while she was on her steroids but then discontinuing it.  She has been following up with [facility].  She was last seen there at the end of January and was using the meal plan about 60% of the time.  She was continued on Wegovy.  She was also encouraged to increase her exercise.  She also [facility] [facility]  She is supposed to be getting bariatric surgery  at [facility].   She has pain in her R hip Shart pain laying down couple weeks ago Had pneumonia    Past Medical History:  Diagnosis Date   ALLERGIC RHINITIS 05/09/2008   Allergy    ANXIETY 05/23/2007   ARM PAIN, LEFT 10/23/2009   Arthritis    Asthma    ASTHMA 05/23/2007   Back pain    Cardiomyopathy ([place]) 09/08/2015   Chest pain    CHF (congestive heart failure) (HCC)    Chronic diastolic heart failure ([place]) 01/22/2020   Constipation    CONTACT DERMATITIS 06/09/2009   Diabetes mellitus without complication ([place])    type II   Dyspnea    allergies-dust, cig smoke, rag weeds   ENDOMETRIOSIS  NOS 05/23/2007   FACIAL PAIN 06/02/2010   Food allergy    FREQUENCY, URINARY 05/17/2010   GERD (gastroesophageal reflux disease)    GLUCOSE INTOLERANCE 08/28/2009   HERPES SIMPLEX, UNCOMPLICATED 06/03/538   Hyperlipidemia 08/28/2015   HYPERTHYROIDISM 05/23/2007   Hypoactive thyroid    HYPOTHYROIDISM 05/09/2008   Impaired glucose tolerance 01/28/2011   INSOMNIA, HX OF 05/23/2007   Joint pain    LATERAL EPICONDYLITIS, RIGHT 03/10/2009   LIPOMA 10/22/2010   NECK PAIN 11/10/2010   OBESITY 05/23/2007   Osteopenia 09/28/2018   Plantar fasciitis    Both feet   Pre-diabetes    SHOULDER PAIN, LEFT 05/17/2010   SINUSITIS- ACUTE-NOS 11/03/2008   SKIN LESION 11/10/2010   Sleep apnea    Stage III chronic kidney disease ([place])    Stage III   Unspecified Chest Pain 07/25/2008   UNSPECIFIED URTICARIA 06/04/2009   URI 08/28/2009   UTI 04/29/2008   VITAMIN D DEFICIENCY 08/28/2009   Wheezing 07/12/2010    Past Surgical History:  Procedure Laterality Date   COLONOSCOPY     ENDOMETRIAL ABLATION     LAMINECTOMY  1996   LAPAROTOMY     exploratory   lower back surgery  07/06/2017   cyst   POLYPECTOMY     s/p endometrial ablation  12/06   TUBAL LIGATION     UTERINE FIBROID SURGERY     WISDOM TOOTH EXTRACTION      Current Outpatient Medications  Medication Sig Dispense Refill   allopurinol (ZYLOPRIM) 100 MG tablet Take 2 tablets (200 mg total) by mouth daily. Take 200 mg by mouth daily. 180 tablet 3   aspirin 81 MG EC tablet Take 81 mg by mouth daily.     estradiol (VIVELLE-DOT) 0.05 MG/24HR patch 1 patch 2 (two) times a week.     ezetimibe (ZETIA) 10 MG tablet TAKE 1 TABLET BY MOUTH EVERY DAY 90 tablet 3   fexofenadine (ALLEGRA) 180 MG tablet Take 180 mg by mouth daily.     gabapentin (NEURONTIN) 100 MG capsule Take 1 capsule (100 mg total) by mouth 2 (two) times daily as needed. 180 capsule 2   KLOR-CON M20 20 MEQ tablet Take 1 tablet (20 mEq total) by mouth daily. OK TO TAKE AN EXTRA ON DAYS YOU TAKE  EXTRA TORSEMIDE 180 tablet 3   levothyroxine (SYNTHROID) 137 MCG tablet TAKE 1 TABLET BY MOUTH DAILY BEFORE BREAKFAST. 90 tablet 3   losartan (COZAAR) 25 MG tablet Take 12.5 mg by mouth daily.     meloxicam (MOBIC) 15 MG tablet 1 taby by mouth once daily as needed 90 tablet 1   metoprolol tartrate (LOPRESSOR) 100 MG tablet TAKE 1 TABLET 2 HOURS PRIOR TO CT 1 tablet 0   predniSONE (DELTASONE) 50  MG tablet Take 1 tablet 13 hours prior to test, then one 7 hours prior to test, and then one 1 hour prior to test 3 tablet 0   progesterone (PROMETRIUM) 100 MG capsule Take 100 mg by mouth daily.     Vitamin D, Ergocalciferol, (DRISDOL) 1.25 MG (50000 UNIT) CAPS capsule Take 1 capsule (50,000 Units total) by mouth every 7 (seven) days. 4 capsule 0   No current facility-administered medications for this visit.    Allergies:   Shellfish allergy, Clarithromycin, Latex, Metronidazole, Zostavax [zoster vaccine live], Adhesive [tape], Amiloride, Gadobenate, Lisinopril, Other, and Peanut oil    Social History:  The patient  reports that she quit smoking about 34 years ago. Her smoking use included cigarettes. She has a 6.00 pack-year smoking history. She has never used smokeless tobacco. She reports that she does not drink alcohol and does not use drugs.   Family History:  The patient's family history includes COPD in her father; Cancer in her maternal grandmother; Depression in her mother; Diabetes in her cousin and sister; Heart disease in her cousin and mother; Hypertension in her cousin and father; Kidney disease in her sister.    ROS:  Please see the history of present illness. (+) Bilateral LE swelling (+) Bilateral LE cramps All other systems are reviewed and negative.   PHYSICAL EXAM: VS:  BP 114/70 (BP Location: Right Arm, Patient Position: Sitting, Cuff Size: Large)    Pulse 94    Ht 5\' 3"  (1.6 m)    Wt 252 lb 9.6 oz (114.6 kg)    BMI 44.75 kg/m  , BMI Body mass index is 44.75 kg/m. GENERAL:   Well appearing HEENT:  Pupils equal round and reactive, fundi not visualized, oral mucosa unremarkable NECK:  No jugular venous distention, waveform within normal limits, carotid upstroke brisk and symmetric, no bruits, no thyromegaly LUNGS:  Diffuse wheezing  HEART:  RRR.  PMI not displaced or sustained,S1 and S2 within normal limits, no S3, no S4, no clicks, no rubs, no murmurs ABD:  Flat, positive bowel sounds normal in frequency in pitch, no bruits, no rebound, no guarding, no midline pulsatile mass, no hepatomegaly, no splenomegaly EXT:  2 plus pulses throughout, 1+ pitting edema above the ankles, no cyanosis no clubbing SKIN:  No rashes no nodules NEURO:  Cranial nerves II through XII grossly intact, motor grossly intact throughout PSYCH:  Cognitively intact, oriented to person place and time  EKG:   11/12/21: Sinus rhythm.  Rate 94 bpm.   09/15/21: Sinus rhythm, rate 90 bpm; LAD 04/21/20: Sinus rhythm.  Rate 79 bpm.  Prior inferior infarct. 01/22/20 sinus rhythm.  Rate 74 bpm.  Low voltage.    Lower Venous DVT 01/14/21 RIGHT:  - No evidence of common femoral vein obstruction.  LEFT:  - No evidence of deep vein thrombosis in the lower extremity.  No indirect evidence of obstruction proximal to the inguinal ligament.  - No cystic structure found in the popliteal fossa.   LE Venous Study 05/31/19 Left: There is no evidence of acute deep vein thrombosis in the lower  extremity. No evidence of superficial thrombophlebitis.   Echo 09/08/15 - Left ventricle: The cavity size was normal. There was mild focal    basal hypertrophy of the septum. Systolic function was mildly to    moderately reduced. The estimated ejection fraction was in the    range of 40% to 45%. Hypokinesis of the anteroseptal and    inferoseptal myocardium. Doppler parameters are consistent  with    abnormal left ventricular relaxation (grade 1 diastolic dysfunction).  - Right ventricle: The cavity size was normal. Wall  thickness was    normal. Systolic function was normal.  - Tricuspid valve: There was trivial regurgitation.  - Pulmonary arteries: PA peak pressure: 26 mm Hg (S).  - Dyssynchrony, with contraction of the anterior and lateral walls    prior to the septal and inferior walls.   Recent Labs: 06/30/2021: B Natriuretic Peptide 18.8 10/20/2021: ALT 32; BUN 25; Creatinine, Ser 1.31; Hemoglobin 13.5; Platelets 330; Potassium 3.9; Sodium 144; TSH 6.440    Lipid Panel    Component Value Date/Time   CHOL 195 10/20/2021 1001   TRIG 60 10/20/2021 1001   HDL 102 10/20/2021 1001   CHOLHDL 3 10/06/2020 1057   VLDL 25.0 10/06/2020 1057   LDLCALC 82 10/20/2021 1001      Wt Readings from Last 3 Encounters:  11/12/21 252 lb 9.6 oz (114.6 kg)  11/03/21 252 lb (114.3 kg)  10/20/21 254 lb (115.2 kg)      ASSESSMENT AND PLAN:  Paroxysmal atrial fibrillation (HCC) [place] when her episode of atrial fibrillation occurred.  She has not had any known recurrence.  Therefore she is not on any anticoagulation.  Continue aspirin.  Chronic heart failure with preserved ejection fraction (HCC) LVEF 60 to 65%.  Previously was 40 to 45% and has recovered.  Her volume status has been stable on torsemide.  I suspect venous insufficiency is also contributing to her edema.  Renal function stable.  Continue torsemide with potassium supplementation.  Continue losartan.  Essential (primary) hypertension Blood pressure is well-controlled on losartan.   OSA on CPAP Continue CPAP.  Chest pain of uncertain etiology Suzanne Hansen had an echo sewed of chest pain that started when lying down.  When she got up and walked it seemed to get worse.  She has not had any more episodes, but she also does not really get any exercise at this time.   Current medicines are reviewed at length with the patient today.  The patient does not have concerns regarding medicines.  The following changes have been made:  no change  Labs/ tests  ordered today include:   Orders Placed This Encounter  Procedures   CT CORONARY MORPH W/CTA COR W/SCORE W/CA W/CM &/OR WO/CM   Basic metabolic panel   EKG 11-SRPR     Disposition:   FU with Keali Mccraw C. Oval Linsey, MD, [facility] in 6 months  I,Mykaella Javier,acting as a Education administrator for Skeet Latch, MD.,have documented all relevant  I, [place] Oval Linsey, MD have reviewed all documentation for this visit.  The documentation of the exam, diagnosis, procedures, and orders on 11/18/2021 are all accurate and complete.  Signed, Neli Fofana C. Oval Linsey, MD, [facility]  11/12/2021 10:55 AM    [place]

## 2021-11-12 NOTE — Patient Instructions (Addendum)
Medication Instructions:  TAKE METOPROLOL 100 MG 2 HOURS PRIOR TO CARDIAC CT HOLD YOUR LOSARTAN THAT MORNING   TAKE BELOW PRIOR TO CARDIAC CT  Prednisone 50 mg - take 13 hours prior to test Take another Prednisone 50 mg 7 hours prior to test Take another Prednisone 50 mg 1 hour prior to test Take Benadryl 50 mg 1 hour prior to test Patient must complete all four doses of above prophylactic medications. Patient will need a ride after test due to Benadryl. *If you need a refill on your cardiac medications before your next appointment, please call your pharmacy*  Lab Work: BMET 1 WEEK PRIOR TO CT  If you have labs (blood work) drawn today and your tests are completely normal, you will receive your results only by: [place] (if you have MyChart) OR A paper copy in the mail If you have any lab test that is abnormal or we need to change your treatment, we will call you to review the results.  Testing/Procedures: Your physician has requested that you have cardiac CT. Cardiac computed tomography (CT) is a painless test that uses an x-ray machine to take clear, detailed pictures of your heart. For further information please visit [email or web address].[email or web address]. Please follow instruction sheet as given.  Follow-Up: At [facility], you and your health needs are our priority.  As part of our continuing mission to provide you with exceptional heart care, we have created designated Provider Care Teams.  These Care Teams include your primary Cardiologist (physician) and Advanced Practice Providers (APPs -  Physician Assistants and Nurse Practitioners) who all work together to provide you with the care you need, when you need it.  We recommend signing up for the patient portal called "MyChart".  Sign up information is provided on this After Visit Summary.  MyChart is used to connect with patients for Virtual Visits (Telemedicine).  Patients are able to view lab/test results, encounter notes, upcoming  appointments, etc.  Non-urgent messages can be sent to your provider as well.   To learn more about what you can do with MyChart, go to [email or web address].    Your next appointment:   6 month(s)  The format for your next appointment:   In Person  Provider:   Skeet Latch, MD{  Other Instructions    Your cardiac CT will be scheduled at one of the below locations:   [facility] [address] [place], [place] [address] [phone]  [facility] [address] [place], [place] [address] [phone]  If scheduled at [facility], please arrive at the [facility] main entrance (entrance A) of [facility] 30 minutes prior to test start time. You can use the FREE valet parking offered at the main entrance (encouraged to control the heart rate for the test) Proceed to the [facility] Radiology Department (first floor) to check-in and test prep.  If scheduled at [facility], please arrive 15 mins early for check-in and test prep.  Please follow these instructions carefully (unless otherwise directed):  Hold all erectile dysfunction medications at least 3 days (72 hrs) prior to test.  On the Night Before the Test: Be sure to Drink plenty of water. Do not consume any caffeinated/decaffeinated beverages or chocolate 12 hours prior to your test. Do not take any antihistamines 12 hours prior to your test. If the patient has contrast allergy: Patient will need a prescription for Prednisone and very clear instructions (as  follows): Prednisone 50 mg - take 13 hours prior to test Take another Prednisone 50 mg 7 hours prior to test Take another Prednisone 50 mg 1 hour prior to test Take Benadryl 50 mg 1 hour prior to test Patient must complete all four doses of above prophylactic medications. Patient will need a ride after test due to Benadryl.  On the Day of the  Test: Drink plenty of water until 1 hour prior to the test. Do not eat any food 4 hours prior to the test. You may take your regular medications prior to the test.  Take metoprolol (Lopressor) two hours prior to test. HOLD Furosemide/Hydrochlorothiazide morning of the test. FEMALES- please wear underwire-free bra if available, avoid dresses & tight clothing      After the Test: Drink plenty of water. After receiving IV contrast, you may experience a mild flushed feeling. This is normal. On occasion, you may experience a mild rash up to 24 hours after the test. This is not dangerous. If this occurs, you can take Benadryl 25 mg and increase your fluid intake. If you experience trouble breathing, this can be serious. If it is severe call 911 IMMEDIATELY. If it is mild, please call our office. If you take any of these medications: Glipizide/Metformin, Avandament, Glucavance, please do not take 48 hours after completing test unless otherwise instructed.  We will call to schedule your test 2-4 weeks out understanding that some insurance companies will need an authorization prior to the service being performed.   For non-scheduling related questions, please contact the cardiac imaging nurse navigator should you have any questions/concerns: Marchia Bond, Cardiac Imaging Nurse Navigator Gordy Clement, Cardiac Imaging Nurse Navigator [facility] Direct Office Dial: [phone]   For scheduling needs, including cancellations and rescheduling, please call [place], [phone].  Cardiac CT Angiogram A cardiac CT angiogram is a procedure to look at the heart and the area around the heart. It may be done to help find the cause of chest pains or other symptoms of heart disease. During this procedure, a substance called contrast dye is injected into the blood vessels in the area to be checked. A large X-ray machine, called a CT scanner, then takes detailed pictures of the  heart and the surrounding area. The procedure is also sometimes called a coronary CT angiogram, coronary artery scanning, or CTA. A cardiac CT angiogram allows the health care provider to see how well blood is flowing to and from the heart. The health care provider will be able to see if there are any problems, such as: Blockage or narrowing of the coronary arteries in the heart. Fluid around the heart. Signs of weakness or disease in the muscles, valves, and tissues of the heart. Tell a health care provider about: Any allergies you have. This is especially important if you have had a previous allergic reaction to contrast dye. All medicines you are taking, including vitamins, herbs, eye drops, creams, and over-the-counter medicines. Any blood disorders you have. Any surgeries you have had. Any medical conditions you have. Whether you are pregnant or may be pregnant. Any anxiety disorders, chronic pain, or other conditions you have that may increase your stress or prevent you from lying still. What are the risks? Generally, this is a safe procedure. However, problems may occur, including: Bleeding. Infection. Allergic reactions to medicines or dyes. Damage to other structures or organs. Kidney damage from the contrast dye that is used. Increased risk of cancer from radiation exposure. This  risk is low. Talk with your health care provider about: The risks and benefits of testing. How you can receive the lowest dose of radiation. What happens before the procedure? Wear comfortable clothing and remove any jewelry, glasses, dentures, and hearing aids. Follow instructions from your health care provider about eating and drinking. This may include: For 12 hours before the procedure -- avoid caffeine. This includes tea, coffee, soda, energy drinks, and diet pills. Drink plenty of water or other fluids that do not have caffeine in them. Being well hydrated can prevent complications. For 4-6 hours  before the procedure -- stop eating and drinking. The contrast dye can cause nausea, but this is less likely if your stomach is empty. Ask your health care provider about changing or stopping your regular medicines. This is especially important if you are taking diabetes medicines, blood thinners, or medicines to treat problems with erections (erectile dysfunction). What happens during the procedure?  Hair on your chest may need to be removed so that small sticky patches called electrodes can be placed on your chest. These will transmit information that helps to monitor your heart during the procedure. An IV will be inserted into one of your veins. You might be given a medicine to control your heart rate during the procedure. This will help to ensure that good images are obtained. You will be asked to lie on an exam table. This table will slide in and out of the CT machine during the procedure. Contrast dye will be injected into the IV. You might feel warm, or you may get a metallic taste in your mouth. You will be given a medicine called nitroglycerin. This will relax or dilate the arteries in your heart. The table that you are lying on will move into the CT machine tunnel for the scan. The person running the machine will give you instructions while the scans are being done. You may be asked to: Keep your arms above your head. Hold your breath. Stay very still, even if the table is moving. When the scanning is complete, you will be moved out of the machine. The IV will be removed. The procedure may vary among health care providers and hospitals. What can I expect after the procedure? After your procedure, it is common to have: A metallic taste in your mouth from the contrast dye. A feeling of warmth. A headache from the nitroglycerin. Follow these instructions at home: Take over-the-counter and prescription medicines only as told by your health care provider. If you are told, drink enough  fluid to keep your urine pale yellow. This will help to flush the contrast dye out of your body. Most people can return to their normal activities right after the procedure. Ask your health care provider what activities are safe for you. It is up to you to get the results of your procedure. Ask your health care provider, or the department that is doing the procedure, when your results will be ready. Keep all follow-up visits as told by your health care provider. This is important. Contact a health care provider if: You have any symptoms of allergy to the contrast dye. These include: Shortness of breath. Rash or hives. A racing heartbeat. Summary A cardiac CT angiogram is a procedure to look at the heart and the area around the heart. It may be done to help find the cause of chest pains or other symptoms of heart disease. During this procedure, a large X-ray machine, called a CT scanner, takes  detailed pictures of the heart and the surrounding area after a contrast dye has been injected into blood vessels in the area. Ask your health care provider about changing or stopping your regular medicines before the procedure. This is especially important if you are taking diabetes medicines, blood thinners, or medicines to treat erectile dysfunction. If you are told, drink enough fluid to keep your urine pale yellow. This will help to flush the contrast dye out of your body. This information is not intended to replace advice given to you by your health care provider. Make sure you discuss any questions you have with your health care provider. Document Revised: 06/09/2021 Document Reviewed: 05/22/2019 Elsevier Patient Education  2022 [employer].

## 2021-11-12 NOTE — Assessment & Plan Note (Addendum)
LVEF 60 to 65%.  Previously was 40 to 45% and has recovered.  Her volume status has been stable on torsemide.  I suspect venous insufficiency is also contributing to her edema.  Renal function stable.  Continue torsemide with potassium supplementation.  Continue losartan.

## 2021-11-12 NOTE — Assessment & Plan Note (Signed)
Suzanne Hansen had an echo sewed of chest pain that started when lying down.  When she got up and walked it seemed to get worse.  She has not had any more episodes, but she also does not really get any exercise at this time.

## 2021-11-12 NOTE — Assessment & Plan Note (Signed)
Continue CPAP.  

## 2021-11-12 NOTE — Assessment & Plan Note (Signed)
Unclea when her episode of atrial fibrillation occurred.  She has not had any known recurrence.  Therefore she is not on any anticoagulation.  Continue aspirin.

## 2021-11-17 DIAGNOSIS — M5412 Radiculopathy, cervical region: Secondary | ICD-10-CM | POA: Diagnosis not present

## 2021-11-18 ENCOUNTER — Encounter (HOSPITAL_BASED_OUTPATIENT_CLINIC_OR_DEPARTMENT_OTHER): Payer: Self-pay | Admitting: Cardiovascular Disease

## 2021-11-19 ENCOUNTER — Ambulatory Visit: Payer: BC Managed Care – PPO | Admitting: Pulmonary Disease

## 2021-11-19 ENCOUNTER — Other Ambulatory Visit: Payer: Self-pay

## 2021-11-19 ENCOUNTER — Encounter: Payer: Self-pay | Admitting: Pulmonary Disease

## 2021-11-19 VITALS — BP 124/66 | HR 92 | Temp 98.1°F | Ht 63.5 in | Wt 259.0 lb

## 2021-11-19 DIAGNOSIS — G4733 Obstructive sleep apnea (adult) (pediatric): Secondary | ICD-10-CM | POA: Diagnosis not present

## 2021-11-19 DIAGNOSIS — J45909 Unspecified asthma, uncomplicated: Secondary | ICD-10-CM | POA: Diagnosis not present

## 2021-11-19 DIAGNOSIS — Z9989 Dependence on other enabling machines and devices: Secondary | ICD-10-CM | POA: Diagnosis not present

## 2021-11-19 DIAGNOSIS — R9389 Abnormal findings on diagnostic imaging of other specified body structures: Secondary | ICD-10-CM | POA: Diagnosis not present

## 2021-11-19 NOTE — Progress Notes (Signed)
Suzanne Hansen    [id number]    Apr 09, 1956  Primary Care Physician:John, Hunt Oris, MD  Referring Physician: Biagio Borg, MD [address] [place],  [place] [address]  Chief complaint: Consult for abnormal chest x-ray  HPI: 66 year old with history of asthma, OSA, heart failure, atrial fibrillation She had an episode of pneumonia in June 2022 and another episode in December 2022 requiring multiple rounds of prednisone and antibiotics.  Chest x-ray in December showed a left lower lobe opacity concerning for atelectasis and has been referred here for further evaluation  Currently she states that her symptoms are better.  She has occasional cough with wheezing Continues on Symbicort for asthma She is due to get CT coronary next week for evaluation of cardiomyopathy with EF 45%  She has history of sleep apnea and follows with Dr. Brett Fairy at [facility] neurology  Pets: No pets Occupation: Water engineer for early childhood Exposures: No mold, hot tub, Jacuzzi.  No feather pillows or comforters Smoking history: 6-pack-year smoker.  Quit in 1988 Travel history: No significant travel history Relevant family history: No family history of lung disease   Outpatient Encounter Medications as of 11/19/2021  Medication Sig   albuterol (VENTOLIN HFA) 108 (90 Base) MCG/ACT inhaler Inhale 1-2 puffs into the lungs every 6 (six) hours as needed for wheezing or shortness of breath.   allopurinol (ZYLOPRIM) 100 MG tablet Take 2 tablets (200 mg total) by mouth daily. Take 200 mg by mouth daily.   aspirin 81 MG EC tablet Take 81 mg by mouth daily.   estradiol (VIVELLE-DOT) 0.05 MG/24HR patch 1 patch 2 (two) times a week.   ezetimibe (ZETIA) 10 MG tablet TAKE 1 TABLET BY MOUTH EVERY DAY   fexofenadine (ALLEGRA) 180 MG tablet Take 180 mg by mouth daily.   gabapentin (NEURONTIN) 100 MG capsule Take 1 capsule (100 mg total) by mouth 2 (two) times daily as needed.   KLOR-CON M20 20 MEQ tablet  Take 1 tablet (20 mEq total) by mouth daily. OK TO TAKE AN EXTRA ON DAYS YOU TAKE EXTRA TORSEMIDE   levothyroxine (SYNTHROID) 137 MCG tablet TAKE 1 TABLET BY MOUTH DAILY BEFORE BREAKFAST.   losartan (COZAAR) 25 MG tablet Take 12.5 mg by mouth daily.   meloxicam (MOBIC) 15 MG tablet 1 taby by mouth once daily as needed   progesterone (PROMETRIUM) 100 MG capsule Take 100 mg by mouth daily.   SYMBICORT 160-4.5 MCG/ACT inhaler Inhale into the lungs.   torsemide (DEMADEX) 20 MG tablet Take 60 mg by mouth daily. Takes 60mg  daily   Vitamin D, Ergocalciferol, (DRISDOL) 1.25 MG (50000 UNIT) CAPS capsule Take 1 capsule (50,000 Units total) by mouth every 7 (seven) days.   metoprolol tartrate (LOPRESSOR) 100 MG tablet TAKE 1 TABLET 2 HOURS PRIOR TO CT (Patient not taking: Reported on 11/19/2021)   predniSONE (DELTASONE) 50 MG tablet Take 1 tablet 13 hours prior to test, then one 7 hours prior to test, and then one 1 hour prior to test (Patient not taking: Reported on 11/19/2021)   No facility-administered encounter medications on file as of 11/19/2021.    Allergies as of 11/19/2021 - Review Complete 11/19/2021  Allergen Reaction Noted   Shellfish allergy Anaphylaxis 08/09/2018   Clarithromycin Nausea And Vomiting and Nausea Only 09/09/2015   Latex Itching and Other (See Comments) 01/02/2013   Metronidazole Nausea And Vomiting and Nausea Only 05/23/2007   Zostavax [zoster vaccine live]  09/27/2017   Adhesive [  tape] Dermatitis 09/02/2019   Amiloride  01/13/2021   Gadobenate  01/13/2021   Lisinopril Cough 08/09/2018   Other Dermatitis 09/02/2019   Peanut oil  12/17/2018    Past Medical History:  Diagnosis Date   ALLERGIC RHINITIS 05/09/2008   Allergy    ANXIETY 05/23/2007   ARM PAIN, LEFT 10/23/2009   Arthritis    Asthma    ASTHMA 05/23/2007   Back pain    Cardiomyopathy ([place]) 09/08/2015   Chest pain    CHF (congestive heart failure) (HCC)    Chronic diastolic heart failure ([place]) 01/22/2020    Constipation    CONTACT DERMATITIS 06/09/2009   Diabetes mellitus without complication ([place])    type II   Dyspnea    allergies-dust, cig smoke, rag weeds   ENDOMETRIOSIS NOS 05/23/2007   FACIAL PAIN 06/02/2010   Food allergy    FREQUENCY, URINARY 05/17/2010   GERD (gastroesophageal reflux disease)    GLUCOSE INTOLERANCE 08/28/2009   HERPES SIMPLEX, UNCOMPLICATED 6/60/6301   Hyperlipidemia 08/28/2015   HYPERTHYROIDISM 05/23/2007   Hypoactive thyroid    HYPOTHYROIDISM 05/09/2008   Impaired glucose tolerance 01/28/2011   INSOMNIA, HX OF 05/23/2007   Joint pain    LATERAL EPICONDYLITIS, RIGHT 03/10/2009   LIPOMA 10/22/2010   NECK PAIN 11/10/2010   OBESITY 05/23/2007   Osteopenia 09/28/2018   Plantar fasciitis    Both feet   Pre-diabetes    SHOULDER PAIN, LEFT 05/17/2010   SINUSITIS- ACUTE-NOS 11/03/2008   SKIN LESION 11/10/2010   Sleep apnea    Stage III chronic kidney disease ([place])    Stage III   Unspecified Chest Pain 07/25/2008   UNSPECIFIED URTICARIA 06/04/2009   URI 08/28/2009   UTI 04/29/2008   VITAMIN D DEFICIENCY 08/28/2009   Wheezing 07/12/2010    Past Surgical History:  Procedure Laterality Date   COLONOSCOPY     ENDOMETRIAL ABLATION     [place]     exploratory   lower back surgery  07/06/2017   cyst   POLYPECTOMY     s/p endometrial ablation  12/06   TUBAL LIGATION     UTERINE FIBROID SURGERY     WISDOM TOOTH EXTRACTION      Family History  Problem Relation Age of Onset   Diabetes Cousin    Hypertension Cousin    Heart disease Cousin    Heart disease Mother        Rheumatic heart disease   Depression Mother    COPD Father    Hypertension Father    Cancer Maternal Grandmother        Breast   Diabetes Sister    Kidney disease Sister     Social History   Socioeconomic History   Marital status: Single    Spouse name: Not on file   Number of children: 3   Years of education: Not on file   Highest education level: Not on file   Occupational History   Occupation: SOCIAL WORKER    Employer: [employer]    Comment: [place]   Occupation: Water engineer for preschool infants  Tobacco Use   Smoking status: Former    Packs/day: 0.50    Years: 12.00    Pack years: 6.00    Types: Cigarettes    Quit date: 11/05/1987    Years since quitting: 34.0   Smokeless tobacco: Never  Vaping Use   Vaping Use: Never used  Substance and Sexual Activity   Alcohol use:  No    Alcohol/week: 1.0 standard drink    Types: 1 Glasses of wine per week    Comment: WINE   Drug use: No   Sexual activity: Not on file  Other Topics Concern   Not on file  Social History Narrative   Not on file   Social Determinants of Health   Financial Resource Strain: Not on file  Food Insecurity: Not on file  Transportation Needs: Not on file  Physical Activity: Not on file  Stress: Not on file  Social Connections: Not on file  Intimate Partner Violence: Not on file    Review of systems: Review of Systems  Constitutional: Negative for fever and chills.  HENT: Negative.   Eyes: Negative for blurred vision.  Respiratory: as per HPI  Cardiovascular: Negative for chest pain and palpitations.  Gastrointestinal: Negative for vomiting, diarrhea, blood per rectum. Genitourinary: Negative for dysuria, urgency, frequency and hematuria.  Musculoskeletal: Negative for myalgias, back pain and joint pain.  Skin: Negative for itching and rash.  Neurological: Negative for dizziness, tremors, focal weakness, seizures and loss of consciousness.  Endo/Heme/Allergies: Negative for environmental allergies.  Psychiatric/Behavioral: Negative for depression, suicidal ideas and hallucinations.  All other systems reviewed and are negative.  Physical Exam: Blood pressure 124/66, pulse 92, temperature 98.1 F (36.7 C), temperature source Oral, height 5' 3.5" (1.613 m), weight 259 lb (117.5 kg), SpO2 98 %. Gen:      No acute distress HEENT:   EOMI, sclera anicteric Neck:     No masses; no thyromegaly Lungs:    Clear to auscultation bilaterally; normal respiratory effort CV:         Regular rate and rhythm; no murmurs Abd:      + bowel sounds; soft, non-tender; no palpable masses, no distension Ext:    No edema; adequate peripheral perfusion Skin:      Warm and dry; no rash Neuro: alert and oriented x 3 Psych: normal mood and affect  Data Reviewed: Imaging: Chest x-ray 10/01/2021-atelectasis, scarring at the left lateral lung base which is stable compared to previous chest x-ray on 09/17/2021.  I have reviewed the images personally.   PFTs:  Labs: CBC 10/20/2021- WBC 10, eos 1%, absolute eosinophil count 100  Assessment:  Asthma Abnormal chest x-ray Her asthma appears to be well controlled on Symbicort She had treatment for pneumonia in December with follow-up chest x-ray showing persistent left lower lobe opacity/atelectasis She has a cardiac CT coming up.  We will review these images to see if there are any lung abnormalities.  Based on review she may need a dedicated CT of the chest Schedule PFTs and follow-up in clinic  OSA Continue CPAP.  Managed by [facility] neurology  Plan/Recommendations: Continue Symbicort Review cardiac CT PFTs  Marshell Garfinkel MD [facility] 11/19/2021, 10:55 AM  CC: Biagio Borg, MD

## 2021-11-19 NOTE — Addendum Note (Signed)
Addended by: Elton Sin on: 11/19/2021 11:21 AM   Modules accepted: Orders

## 2021-11-19 NOTE — Patient Instructions (Signed)
You have a CT of your heart coming up.  We will review this to see if there is any lung abnormalities and determine if further follow-up is needed Continue the inhalers We will schedule PFTs and follow-up in clinic at next available

## 2021-11-22 ENCOUNTER — Ambulatory Visit (INDEPENDENT_AMBULATORY_CARE_PROVIDER_SITE_OTHER): Payer: BC Managed Care – PPO | Admitting: Bariatrics

## 2021-11-22 ENCOUNTER — Telehealth (INDEPENDENT_AMBULATORY_CARE_PROVIDER_SITE_OTHER): Payer: Self-pay

## 2021-11-22 ENCOUNTER — Other Ambulatory Visit: Payer: Self-pay

## 2021-11-22 ENCOUNTER — Other Ambulatory Visit (HOSPITAL_COMMUNITY): Payer: Self-pay

## 2021-11-22 ENCOUNTER — Encounter (INDEPENDENT_AMBULATORY_CARE_PROVIDER_SITE_OTHER): Payer: Self-pay | Admitting: Bariatrics

## 2021-11-22 VITALS — BP 135/89 | HR 84 | Temp 97.5°F | Ht 63.0 in | Wt 253.0 lb

## 2021-11-22 DIAGNOSIS — Z6841 Body Mass Index (BMI) 40.0 and over, adult: Secondary | ICD-10-CM | POA: Diagnosis not present

## 2021-11-22 DIAGNOSIS — I5032 Chronic diastolic (congestive) heart failure: Secondary | ICD-10-CM

## 2021-11-22 DIAGNOSIS — R7303 Prediabetes: Secondary | ICD-10-CM | POA: Diagnosis not present

## 2021-11-22 DIAGNOSIS — R079 Chest pain, unspecified: Secondary | ICD-10-CM | POA: Diagnosis not present

## 2021-11-22 DIAGNOSIS — I10 Essential (primary) hypertension: Secondary | ICD-10-CM | POA: Diagnosis not present

## 2021-11-22 DIAGNOSIS — N1831 Chronic kidney disease, stage 3a: Secondary | ICD-10-CM

## 2021-11-22 DIAGNOSIS — E669 Obesity, unspecified: Secondary | ICD-10-CM | POA: Diagnosis not present

## 2021-11-22 MED ORDER — OZEMPIC (0.25 OR 0.5 MG/DOSE) 2 MG/1.5ML ~~LOC~~ SOPN
0.5000 mg | PEN_INJECTOR | SUBCUTANEOUS | 0 refills | Status: DC
  Filled 2021-11-22: qty 1.5, 28d supply, fill #0

## 2021-11-22 NOTE — Telephone Encounter (Signed)
Prior authorization started for Ozempic. Will notify patient and provider once response is received.

## 2021-11-22 NOTE — Telephone Encounter (Signed)
Thank you :)

## 2021-11-23 ENCOUNTER — Telehealth (HOSPITAL_BASED_OUTPATIENT_CLINIC_OR_DEPARTMENT_OTHER): Payer: Self-pay | Admitting: Cardiovascular Disease

## 2021-11-23 LAB — BASIC METABOLIC PANEL
BUN/Creatinine Ratio: 16 (ref 12–28)
BUN: 22 mg/dL (ref 8–27)
CO2: 24 mmol/L (ref 20–29)
Calcium: 10.7 mg/dL — ABNORMAL HIGH (ref 8.7–10.3)
Chloride: 101 mmol/L (ref 96–106)
Creatinine, Ser: 1.35 mg/dL — ABNORMAL HIGH (ref 0.57–1.00)
Glucose: 95 mg/dL (ref 70–99)
Potassium: 4.1 mmol/L (ref 3.5–5.2)
Sodium: 142 mmol/L (ref 134–144)
eGFR: 44 mL/min/{1.73_m2} — ABNORMAL LOW (ref >59)

## 2021-11-23 NOTE — Progress Notes (Signed)
Chief Complaint:   OBESITY Suzanne Hansen is here to discuss her progress with her obesity treatment plan along with follow-up of her obesity related diagnoses. Suzanne Hansen is on the Category 2 Plan and states she is following her eating plan approximately 90% of the time. Suzanne Hansen states she is doing 0 minutes 0 times per week.  Today's visit was #: 3 Starting weight: 254 lbs Starting date: 10/31/2021 Today's weight: 253 lbs Today's date: 11/22/2021 Total lbs lost to date: 1 lb Total lbs lost since last in-office visit: 0  Interim History: Suzanne Hansen is up 1 lb since her last visit. She did not have the foods that she needed.   Subjective:   1. Pre-diabetes associated with chronic kidney disease and chronic heart disease Suzanne Hansen is currently not on medications. She was taking Probiotics with Metformin. She noted constipation and gastrointestinal issues intermittently. . She had heart failure. She will need a pre-authorization rush at [facility].   2. Essential hypertension Suzanne Hansen's diastolic blood pressure is slightly elevated at 130/82 today.  Assessment/Plan:   1. Pre-diabetes associated with chronic kidney disease and chronic heart disease Suzanne Hansen agrees to start Ozempic 0.25 mg weekly with no refills. Good blood sugar control is important to decrease the likelihood of diabetic complications such as nephropathy, neuropathy, limb loss, blindness, coronary artery disease, and death. Intensive lifestyle modification including diet, exercise and weight loss are the first line of treatment for diabetes.   - Semaglutide,0.25 or 0.5MG /DOS, (OZEMPIC, 0.25 OR 0.5 MG/DOSE,) 2 MG/1.5ML SOPN; Inject 0.5 mg into the skin once a week.  Dispense: 3 mL; Refill: 0  2. Essential hypertension Suzanne Hansen will continue taking her medications. She is working on healthy weight loss and exercise to improve blood pressure control. We will watch for signs of hypotension as she continues her lifestyle  modifications.  3. Obesity, current BMI 44.9 Suzanne Hansen is currently in the action stage of change. As such, her goal is to continue with weight loss efforts. She has agreed to the Category 2 Plan and keeping a food journal and adhering to recommended goals of 1200 calories and 80 grams of protein.   Suzanne Hansen will follow the plan about 90%. She will keep all foods on hand.  Exercise goals: No exercise has been prescribed at this time.  Behavioral modification strategies: increasing lean protein intake, decreasing simple carbohydrates, increasing vegetables, increasing water intake, decreasing eating out, no skipping meals, meal planning and cooking strategies, keeping healthy foods in the home, and planning for success.  Suzanne Hansen has agreed to follow-up with our clinic in 2 weeks. She was informed of the importance of frequent follow-up visits to maximize her success with intensive lifestyle modifications for her multiple health conditions.   Objective:   Blood pressure 135/89, pulse 84, temperature (!) 97.5 F (36.4 C), height 5\' 3"  (1.6 m), weight 253 lb (114.8 kg), SpO2 94 %. Body mass index is 44.82 kg/m.  General: Cooperative, alert, well developed, in no acute distress. HEENT: Conjunctivae and lids unremarkable. Cardiovascular: Regular rhythm.  Lungs: Normal work of breathing. Neurologic: No focal deficits.   Lab Results  Component Value Date   CREATININE 1.35 (H) 11/22/2021   BUN 22 11/22/2021   NA 142 11/22/2021   K 4.1 11/22/2021   CL 101 11/22/2021   CO2 24 11/22/2021   Lab Results  Component Value Date   ALT 32 10/20/2021   AST 27 10/20/2021   ALKPHOS 84 10/20/2021   BILITOT 0.5 10/20/2021   Lab Results  Component Value Date   HGBA1C 5.7 (H) 10/20/2021   HGBA1C 5.5 10/06/2020   HGBA1C 5.8 10/02/2019   HGBA1C 5.7 (H) 07/18/2019   HGBA1C 5.6 09/28/2018   Lab Results  Component Value Date   INSULIN 14.7 10/20/2021   INSULIN 20.7 07/18/2019   INSULIN 17.9  08/09/2018   Lab Results  Component Value Date   TSH 6.440 (H) 10/20/2021   Lab Results  Component Value Date   CHOL 195 10/20/2021   HDL 102 10/20/2021   LDLCALC 82 10/20/2021   TRIG 60 10/20/2021   CHOLHDL 3 10/06/2020   Lab Results  Component Value Date   VD25OH 26.2 (L) 10/20/2021   VD25OH 27.53 (L) 10/06/2020   VD25OH 28.06 (L) 10/02/2019   Lab Results  Component Value Date   WBC 10.0 10/20/2021   HGB 13.5 10/20/2021   HCT 40.8 10/20/2021   MCV 90 10/20/2021   PLT 330 10/20/2021   Lab Results  Component Value Date   IRON 44 10/02/2019   Attestation Statements:   Reviewed by clinician on day of visit: allergies, medications, problem list, medical history, surgical history, family history, social history, and previous encounter notes.  I, Lizbeth Bark, RMA, am acting as Location manager for [employer], DO.  I have reviewed the above documentation for accuracy and completeness, and I agree with the above. Jearld Lesch, DO

## 2021-11-23 NOTE — Telephone Encounter (Signed)
Patient wanted to talk to Dr. Blenda Mounts Nurse about taking benadryl before her CT or whether it is still necessary or not. Please advise

## 2021-11-24 ENCOUNTER — Other Ambulatory Visit (HOSPITAL_COMMUNITY): Payer: Self-pay

## 2021-11-24 ENCOUNTER — Encounter (INDEPENDENT_AMBULATORY_CARE_PROVIDER_SITE_OTHER): Payer: Self-pay | Admitting: Bariatrics

## 2021-11-24 ENCOUNTER — Ambulatory Visit (INDEPENDENT_AMBULATORY_CARE_PROVIDER_SITE_OTHER): Payer: BC Managed Care – PPO | Admitting: Bariatrics

## 2021-11-24 DIAGNOSIS — M25551 Pain in right hip: Secondary | ICD-10-CM | POA: Diagnosis not present

## 2021-11-24 DIAGNOSIS — M7061 Trochanteric bursitis, right hip: Secondary | ICD-10-CM | POA: Diagnosis not present

## 2021-11-25 ENCOUNTER — Telehealth (HOSPITAL_COMMUNITY): Payer: Self-pay | Admitting: *Deleted

## 2021-11-25 ENCOUNTER — Encounter (INDEPENDENT_AMBULATORY_CARE_PROVIDER_SITE_OTHER): Payer: Self-pay

## 2021-11-25 NOTE — Telephone Encounter (Signed)
Patient returning call regarding upcoming cardiac imaging study; pt verbalizes understanding of appt date/time, parking situation and where to check in, pre-test NPO status and medications ordered, and verified current allergies; name and call back number provided for further questions should they arise  Gordy Clement RN Navigator Cardiac Imaging [facility] [phone] office [phone] cell  Patient to take 100mg  metoprolol tartrate two hours prior to her cardiac CT scan.  She is aware to arrive at 1:30pm for her 2pm scan.

## 2021-11-25 NOTE — Telephone Encounter (Signed)
Returned call to patient, explained she is being instructed to take Benadryl and prednisone prior to her CT scan as a precaution due to her shellfish allergy.  Pt verbalized understanding. Georgana Curio MHA RN CCM

## 2021-11-25 NOTE — Telephone Encounter (Signed)
Patient called again wanting to know why she needs to take benadryl for the CT scan, she is not allergic to the dye.

## 2021-11-25 NOTE — Telephone Encounter (Signed)
Attempted to call patient regarding upcoming cardiac CT appointment. °Left message on voicemail with name and callback number ° °Ariea Rochin RN Navigator Cardiac Imaging °[facility] °[phone] Office °[phone] Cell ° °

## 2021-11-26 ENCOUNTER — Ambulatory Visit (HOSPITAL_COMMUNITY)
Admission: RE | Admit: 2021-11-26 | Discharge: 2021-11-26 | Disposition: A | Discharge status: Home / Self Care | Payer: BC Managed Care – PPO | Source: Ambulatory Visit | Attending: Cardiovascular Disease | Admitting: Cardiovascular Disease

## 2021-11-26 ENCOUNTER — Other Ambulatory Visit: Payer: Self-pay

## 2021-11-26 DIAGNOSIS — R079 Chest pain, unspecified: Secondary | ICD-10-CM | POA: Insufficient documentation

## 2021-11-26 DIAGNOSIS — I10 Essential (primary) hypertension: Secondary | ICD-10-CM | POA: Diagnosis not present

## 2021-11-26 DIAGNOSIS — I480 Paroxysmal atrial fibrillation: Secondary | ICD-10-CM | POA: Insufficient documentation

## 2021-11-26 DIAGNOSIS — I5032 Chronic diastolic (congestive) heart failure: Secondary | ICD-10-CM | POA: Insufficient documentation

## 2021-11-26 MED ORDER — NITROGLYCERIN 0.4 MG SL SUBL
0.8000 mg | SUBLINGUAL_TABLET | Freq: Once | SUBLINGUAL | Status: AC
  Administered 2021-11-26: 0.8 mg via SUBLINGUAL

## 2021-11-26 MED ORDER — METOPROLOL TARTRATE 5 MG/5ML IV SOLN
INTRAVENOUS | Status: AC
  Administered 2021-11-26: 5 mg via INTRAVENOUS
  Filled 2021-11-26: qty 10

## 2021-11-26 MED ORDER — METOPROLOL TARTRATE 5 MG/5ML IV SOLN: 5.0000 mg | Freq: Once | INTRAVENOUS | Status: AC

## 2021-11-26 MED ORDER — NITROGLYCERIN 0.4 MG SL SUBL
SUBLINGUAL_TABLET | SUBLINGUAL | Status: AC
  Filled 2021-11-26: qty 2

## 2021-11-26 MED ORDER — IOHEXOL 350 MG/ML SOLN
95.0000 mL | Freq: Once | INTRAVENOUS | Status: AC | PRN
  Administered 2021-11-26: 95 mL via INTRAVENOUS

## 2021-11-30 ENCOUNTER — Encounter: Payer: Self-pay | Admitting: Internal Medicine

## 2021-12-06 ENCOUNTER — Other Ambulatory Visit (INDEPENDENT_AMBULATORY_CARE_PROVIDER_SITE_OTHER): Payer: Self-pay | Admitting: Physician Assistant

## 2021-12-06 ENCOUNTER — Ambulatory Visit (INDEPENDENT_AMBULATORY_CARE_PROVIDER_SITE_OTHER): Payer: BC Managed Care – PPO | Admitting: Physician Assistant

## 2021-12-06 ENCOUNTER — Encounter (INDEPENDENT_AMBULATORY_CARE_PROVIDER_SITE_OTHER): Payer: Self-pay | Admitting: Physician Assistant

## 2021-12-06 ENCOUNTER — Other Ambulatory Visit: Payer: Self-pay

## 2021-12-06 VITALS — BP 122/74 | HR 70 | Temp 97.9°F | Ht 63.0 in | Wt 254.0 lb

## 2021-12-06 DIAGNOSIS — R7303 Prediabetes: Secondary | ICD-10-CM

## 2021-12-06 DIAGNOSIS — E559 Vitamin D deficiency, unspecified: Secondary | ICD-10-CM

## 2021-12-06 DIAGNOSIS — E669 Obesity, unspecified: Secondary | ICD-10-CM | POA: Diagnosis not present

## 2021-12-06 DIAGNOSIS — Z6841 Body Mass Index (BMI) 40.0 and over, adult: Secondary | ICD-10-CM

## 2021-12-06 MED ORDER — VITAMIN D (ERGOCALCIFEROL) 1.25 MG (50000 UNIT) PO CAPS: 50000.0000 [IU] | ORAL_CAPSULE | ORAL | 0 refills | Status: DC

## 2021-12-06 MED ORDER — SAXENDA 18 MG/3ML ~~LOC~~ SOPN: PEN_INJECTOR | SUBCUTANEOUS | 0 refills | Status: DC

## 2021-12-06 NOTE — Telephone Encounter (Signed)
Need PA for Saxenda.

## 2021-12-06 NOTE — Telephone Encounter (Signed)
Prior authorization already started for Saxenda. Will notify patient and provider once response is received.

## 2021-12-06 NOTE — Progress Notes (Signed)
Chief Complaint:   OBESITY Suzanne Hansen is here to discuss her progress with her obesity treatment plan along with follow-up of her obesity related diagnoses. Suzanne Hansen is on the Category 2 Plan and keeping a food journal and adhering to recommended goals of 1200 calories and 80 grams of protein and states she is following her eating plan approximately 80% of the time. 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  Today's visit was #: 4 Starting weight: 254 lbs Starting date: 10/31/2021 Today's weight: 254 lbs Today's date: 12/06/2021 Total lbs lost to date: 0 Total lbs lost since last in-office visit: 0  Interim History: Ecko states that she has not been able to get Ozempic, which is why she came back to our clinic. She  does not want to continue with visits if she can't get medicine to help with appetite. She states that she over snacks on chips.   Subjective:   1. Vitamin D deficiency Suzanne Hansen is on Vitamin D weekly.  2. Pre-diabetes associated with chronic kidney disease and chronic heart disease Suzanne Hansen's last A1C was 5.7. Her insurance did not approve Ozempic.   Assessment/Plan:   1. Vitamin D deficiency Low Vitamin D level contributes to fatigue and are associated with obesity, breast, and colon cancer. We will refill prescription Vitamin D 50,000 IU every week for 1 month with no refills and Suzanne Hansen will follow-up for routine testing of Vitamin D, at least 2-3 times per year to avoid over-replacement.  - Vitamin D, Ergocalciferol, (DRISDOL) 1.25 MG (50000 UNIT) CAPS capsule; Take 1 capsule (50,000 Units total) by mouth every 7 (seven) days.  Dispense: 4 capsule; Refill: 0  2. Pre-diabetes associated with chronic kidney disease and chronic heart disease Suzanne Hansen will continue with the plan and she will continue to work on weight loss, exercise, and decreasing simple carbohydrates to help decrease the risk of diabetes.    3. Obesity, current BMI 45.01 Suzanne Hansen is currently in the action stage of change. As such, her goal is to continue with weight loss efforts. She has agreed to keeping a food journal and adhering to recommended goals of 1200 calories and 80 grams of protein daily.    We will attempt to get Saxenda 3 mg with no refills approved.   - Liraglutide -Weight Management (SAXENDA) 18 MG/3ML SOPN; Inject 0.6mg  daily for one week, then 1.2mg  daily for one week, then 1.8mg  daily for one week, then 2.4mg  daily for one week, then 3.0mg  daily  Dispense: 15 mL; Refill: 0  Exercise goals:  As is.   Behavioral modification strategies: increasing lean protein intake and decreasing simple carbohydrates.  Suzanne Hansen has agreed to call for an appointment. She was informed of the importance of frequent follow-up visits to maximize her success with intensive lifestyle modifications for her multiple health conditions.   Objective:   Blood pressure 122/74, pulse 70, temperature 97.9 F (36.6 C), height 5\' 3"  (1.6 m), weight 254 lb (115.2 kg), SpO2 99 %. Body mass index is 44.99 kg/m.  General: Cooperative, alert, well developed, in no acute distress. HEENT: Conjunctivae and lids unremarkable. Cardiovascular: Regular rhythm.  Lungs: Normal work of breathing. Neurologic: No focal deficits.   Lab Results  Component Value Date   CREATININE 1.35 (H) 11/22/2021   BUN 22 11/22/2021   NA 142 11/22/2021   K 4.1 11/22/2021   CL 101 11/22/2021   CO2 24 11/22/2021   Lab Results  Component Value  Date   ALT 32 10/20/2021   AST 27 10/20/2021   ALKPHOS 84 10/20/2021   BILITOT 0.5 10/20/2021   Lab Results  Component Value Date   HGBA1C 5.7 (H) 10/20/2021   HGBA1C 5.5 10/06/2020   HGBA1C 5.8 10/02/2019   HGBA1C 5.7 (H) 07/18/2019   HGBA1C 5.6 09/28/2018   Lab Results  Component Value Date   INSULIN 14.7 10/20/2021   INSULIN 20.7 07/18/2019   INSULIN 17.9 08/09/2018   Lab Results  Component Value Date    TSH 6.440 (H) 10/20/2021   Lab Results  Component Value Date   CHOL 195 10/20/2021   HDL 102 10/20/2021   LDLCALC 82 10/20/2021   TRIG 60 10/20/2021   CHOLHDL 3 10/06/2020   Lab Results  Component Value Date   VD25OH 26.2 (L) 10/20/2021   VD25OH 27.53 (L) 10/06/2020   VD25OH 28.06 (L) 10/02/2019   Lab Results  Component Value Date   WBC 10.0 10/20/2021   HGB 13.5 10/20/2021   HCT 40.8 10/20/2021   MCV 90 10/20/2021   PLT 330 10/20/2021   Lab Results  Component Value Date   IRON 44 10/02/2019   Attestation Statements:   Reviewed by clinician on day of visit: allergies, medications, problem list, medical history, surgical history, family history, social history, and previous encounter notes.  I, Tonye Pearson, am acting as Location manager for [employer], PA-C.  I have reviewed the above documentation for accuracy and completeness, and I agree with the above. Abby Potash, PA-C

## 2021-12-06 NOTE — Telephone Encounter (Signed)
Suzanne Hansen 

## 2021-12-09 ENCOUNTER — Encounter (INDEPENDENT_AMBULATORY_CARE_PROVIDER_SITE_OTHER): Payer: Self-pay

## 2021-12-13 ENCOUNTER — Other Ambulatory Visit: Payer: Self-pay | Admitting: Internal Medicine

## 2021-12-13 NOTE — Telephone Encounter (Signed)
Please refill as per office routine med refill policy (all routine meds to be refilled for 3 mo or monthly (per pt preference) up to one year from last visit, then month to month grace period for 3 mo, then further med refills will have to be denied) ? ?

## 2021-12-15 ENCOUNTER — Other Ambulatory Visit (HOSPITAL_COMMUNITY): Payer: Self-pay

## 2021-12-22 ENCOUNTER — Encounter: Payer: Self-pay | Admitting: Gastroenterology

## 2021-12-24 ENCOUNTER — Ambulatory Visit (AMBULATORY_SURGERY_CENTER): Payer: BC Managed Care – PPO

## 2021-12-24 ENCOUNTER — Other Ambulatory Visit: Payer: Self-pay

## 2021-12-24 VITALS — Ht 63.0 in | Wt 253.0 lb

## 2021-12-24 DIAGNOSIS — Z86010 Personal history of colonic polyps: Secondary | ICD-10-CM

## 2021-12-24 MED ORDER — NA SULFATE-K SULFATE-MG SULF 17.5-3.13-1.6 GM/177ML PO SOLN: 1.0000 | Freq: Once | ORAL | 0 refills | Status: AC

## 2021-12-24 NOTE — Progress Notes (Signed)
Denies allergies to eggs or soy products. Denies complication of anesthesia or sedation. Denies use of weight loss medication. Denies use of O2.   Emmi instructions given for colonoscopy.  

## 2021-12-27 ENCOUNTER — Other Ambulatory Visit: Payer: Self-pay

## 2021-12-27 ENCOUNTER — Ambulatory Visit: Payer: BC Managed Care – PPO | Admitting: Pulmonary Disease

## 2021-12-27 ENCOUNTER — Ambulatory Visit (INDEPENDENT_AMBULATORY_CARE_PROVIDER_SITE_OTHER): Payer: BC Managed Care – PPO | Admitting: Pulmonary Disease

## 2021-12-27 ENCOUNTER — Encounter: Payer: Self-pay | Admitting: Pulmonary Disease

## 2021-12-27 VITALS — BP 136/74 | HR 73 | Temp 97.7°F | Ht 63.0 in | Wt 261.0 lb

## 2021-12-27 DIAGNOSIS — J45909 Unspecified asthma, uncomplicated: Secondary | ICD-10-CM

## 2021-12-27 DIAGNOSIS — R0602 Shortness of breath: Secondary | ICD-10-CM | POA: Diagnosis not present

## 2021-12-27 LAB — PULMONARY FUNCTION TEST
DL/VA % pred: 103 %
DL/VA: 4.36 ml/min/mmHg/L
DLCO cor % pred: 73 %
DLCO cor: 14.1 ml/min/mmHg
DLCO unc % pred: 73 %
DLCO unc: 14.1 ml/min/mmHg
FEF 25-75 Post: 1.33 L/sec
FEF 25-75 Pre: 0.95 L/sec
FEF2575-%Change-Post: 39 %
FEF2575-%Pred-Post: 71 %
FEF2575-%Pred-Pre: 51 %
FEV1-%Change-Post: 10 %
FEV1-%Pred-Post: 76 %
FEV1-%Pred-Pre: 69 %
FEV1-Post: 1.46 L
FEV1-Pre: 1.32 L
FEV1FVC-%Change-Post: -3 %
FEV1FVC-%Pred-Pre: 93 %
FEV6-%Change-Post: 14 %
FEV6-%Pred-Post: 87 %
FEV6-%Pred-Pre: 75 %
FEV6-Post: 2.05 L
FEV6-Pre: 1.78 L
FEV6FVC-%Pred-Post: 103 %
FEV6FVC-%Pred-Pre: 103 %
FVC-%Change-Post: 14 %
FVC-%Pred-Post: 84 %
FVC-%Pred-Pre: 73 %
FVC-Post: 2.05 L
FVC-Pre: 1.78 L
Post FEV1/FVC ratio: 71 %
Post FEV6/FVC ratio: 100 %
Pre FEV1/FVC ratio: 74 %
Pre FEV6/FVC Ratio: 100 %
RV % pred: 131 %
RV: 2.65 L
TLC % pred: 99 %
TLC: 4.89 L

## 2021-12-27 NOTE — Progress Notes (Signed)
Full PFT performed today. °

## 2021-12-27 NOTE — Progress Notes (Addendum)
? ?      ?Suzanne Hansen    [id number]    July 26, 1956 ? ?Primary Care Physician:John, Hunt Oris, MD ? ?Referring Physician: Biagio Borg, MD ?[place],  [place] [address] ? ?Chief complaint: Follow-up for abnormal chest x-ray ? ?HPI: ?66 year old with history of asthma, OSA, heart failure, atrial fibrillation ?She had an episode of pneumonia in June 2022 and another episode in December 2022 requiring multiple rounds of prednisone and antibiotics.  Chest x-ray in December showed a left lower lobe opacity concerning for atelectasis and has been referred here for further evaluation ? ?Currently she states that her symptoms are better.  She has occasional cough with wheezing ?Continues on Symbicort for asthma ?She is due to get CT coronary next week for evaluation of cardiomyopathy with EF 45% ? ?She has history of sleep apnea and follows with Dr. Brett Fairy at [facility] neurology ? ?Pets: No pets ?Occupation: Water engineer for early childhood ?Exposures: No mold, hot tub, Jacuzzi.  No feather pillows or comforters ?Smoking history: 6-pack-year smoker.  Quit in 1988 ?Travel history: No significant travel history ?Relevant family history: No family history of lung disease ? ?Interim history:  ?States that breathing is stable.  No new complaints today ? ? ? ?Outpatient Encounter Medications as of 12/27/2021  ?Medication Sig  ? albuterol (VENTOLIN HFA) 108 (90 Base) MCG/ACT inhaler Inhale 1-2 puffs into the lungs every 6 (six) hours as needed for wheezing or shortness of breath.  ? allopurinol (ZYLOPRIM) 100 MG tablet TAKE 2 TABLETS (200 MG TOTAL) BY MOUTH DAILY. TAKE 200 MG BY MOUTH DAILY.  ? aspirin 81 MG EC tablet Take 81 mg by mouth daily.  ? estradiol (VIVELLE-DOT) 0.05 MG/24HR patch 1 patch 2 (two) times a week.  ? ezetimibe (ZETIA) 10 MG tablet TAKE 1 TABLET BY MOUTH EVERY DAY  ? fexofenadine (ALLEGRA) 180 MG tablet Take 180 mg by mouth daily.  ? gabapentin (NEURONTIN) 100 MG capsule Take 1 capsule  (100 mg total) by mouth 2 (two) times daily as needed.  ? KLOR-CON M20 20 MEQ tablet Take 1 tablet (20 mEq total) by mouth daily. OK TO TAKE AN EXTRA ON DAYS YOU TAKE EXTRA TORSEMIDE  ? levothyroxine (SYNTHROID) 137 MCG tablet TAKE 1 TABLET BY MOUTH DAILY BEFORE BREAKFAST.  ? losartan (COZAAR) 25 MG tablet Take 12.5 mg by mouth daily.  ? progesterone (PROMETRIUM) 100 MG capsule Take 100 mg by mouth daily.  ? SYMBICORT 160-4.5 MCG/ACT inhaler Inhale into the lungs.  ? torsemide (DEMADEX) 20 MG tablet Take 60 mg by mouth daily. Takes '60mg'$  daily  ? Vitamin D, Ergocalciferol, (DRISDOL) 1.25 MG (50000 UNIT) CAPS capsule Take 1 capsule (50,000 Units total) by mouth every 7 (seven) days.  ? ?No facility-administered encounter medications on file as of 12/27/2021.  ? ? ?Allergies as of 12/27/2021 - Review Complete 12/27/2021  ?Allergen Reaction Noted  ? Shellfish allergy Anaphylaxis 08/09/2018  ? Clarithromycin Nausea And Vomiting and Nausea Only 09/09/2015  ? Latex Itching and Other (See Comments) 01/02/2013  ? Metronidazole Nausea And Vomiting and Nausea Only 05/23/2007  ? Zostavax [zoster vaccine live]  09/27/2017  ? Adhesive [tape] Dermatitis 09/02/2019  ? Amiloride  01/13/2021  ? Gadobenate  01/13/2021  ? Lisinopril Cough 08/09/2018  ? Other Dermatitis 09/02/2019  ? Peanut oil  12/17/2018  ? ? ?Past Medical History:  ?Diagnosis Date  ? ALLERGIC RHINITIS 05/09/2008  ? Allergy   ? ANXIETY 05/23/2007  ? ARM PAIN, LEFT 10/23/2009  ?  Arthritis   ? Asthma   ? ASTHMA 05/23/2007  ? Back pain   ? Cardiomyopathy ([place]) 09/08/2015  ? Cataract   ? Chest pain   ? CHF (congestive heart failure) ([place])   ? Chronic diastolic heart failure ([place]) 01/22/2020  ? Constipation   ? CONTACT DERMATITIS 06/09/2009  ? Diabetes mellitus without complication ([place])   ? type II  ? Dyspnea   ? allergies-dust, cig smoke, rag weeds  ? ENDOMETRIOSIS NOS 05/23/2007  ? FACIAL PAIN 06/02/2010  ? Food allergy   ? FREQUENCY, URINARY 05/17/2010  ? GERD  (gastroesophageal reflux disease)   ? GLUCOSE INTOLERANCE 08/28/2009  ? HERPES SIMPLEX, UNCOMPLICATED 02/58/5277  ? Hyperlipidemia 08/28/2015  ? HYPERTHYROIDISM 05/23/2007  ? Hypoactive thyroid   ? HYPOTHYROIDISM 05/09/2008  ? Impaired glucose tolerance 01/28/2011  ? INSOMNIA, HX OF 05/23/2007  ? Joint pain   ? LATERAL EPICONDYLITIS, RIGHT 03/10/2009  ? LIPOMA 10/22/2010  ? NECK PAIN 11/10/2010  ? OBESITY 05/23/2007  ? Osteopenia 09/28/2018  ? Plantar fasciitis   ? Both feet  ? Pre-diabetes   ? SHOULDER PAIN, LEFT 05/17/2010  ? SINUSITIS- ACUTE-NOS 11/03/2008  ? SKIN LESION 11/10/2010  ? Sleep apnea   ? Stage III chronic kidney disease ([place])   ? Stage III  ? Unspecified Chest Pain 07/25/2008  ? UNSPECIFIED URTICARIA 06/04/2009  ? URI 08/28/2009  ? UTI 04/29/2008  ? VITAMIN D DEFICIENCY 08/28/2009  ? Wheezing 07/12/2010  ? ? ?Past Surgical History:  ?Procedure Laterality Date  ? COLONOSCOPY    ? ENDOMETRIAL ABLATION    ? LAMINECTOMY  1996  ? LAPAROTOMY    ? exploratory  ? lower back surgery  07/06/2017  ? cyst  ? POLYPECTOMY    ? s/p endometrial ablation  12/06  ? TUBAL LIGATION    ? UTERINE FIBROID SURGERY    ? WISDOM TOOTH EXTRACTION    ? ? ?Family History  ?Problem Relation Age of Onset  ? Heart disease Mother   ?     Rheumatic heart disease  ? Depression Mother   ? COPD Father   ? Hypertension Father   ? Diabetes Sister   ? Kidney disease Sister   ? Cancer Maternal Grandmother   ?     Breast  ? Diabetes Cousin   ? Hypertension Cousin   ? Heart disease Cousin   ? Colon cancer Neg Hx   ? Esophageal cancer Neg Hx   ? Rectal cancer Neg Hx   ? Stomach cancer Neg Hx   ? ? ?Social History  ? ?Socioeconomic History  ? Marital status: Single  ?  Spouse name: Not on file  ? Number of children: 3  ? Years of education: Not on file  ? Highest education level: Not on file  ?Occupational History  ? Occupation: SOCIAL WORKER  ?  Employer: [employer]  ?  Comment: [place]  ? Occupation: Water engineer for  preschool infants  ?Tobacco Use  ? Smoking status: Former  ?  Packs/day: 0.50  ?  Years: 12.00  ?  Pack years: 6.00  ?  Types: Cigarettes  ?  Quit date: 11/05/1987  ?  Years since quitting: 34.1  ? Smokeless tobacco: Never  ?Vaping Use  ? Vaping Use: Never used  ?Substance and Sexual Activity  ? Alcohol use: Yes  ?  Alcohol/week: 1.0 standard drink  ?  Types: 1 Glasses of wine per week  ?  Comment: WINE  ? Drug use:  No  ? Sexual activity: Not on file  ?Other Topics Concern  ? Not on file  ?Social History Narrative  ? Not on file  ? ?Social Determinants of Health  ? ?Financial Resource Strain: Not on file  ?Food Insecurity: Not on file  ?Transportation Needs: Not on file  ?Physical Activity: Not on file  ?Stress: Not on file  ?Social Connections: Not on file  ?Intimate Partner Violence: Not on file  ? ? ?Review of systems: ?Review of Systems  ?Constitutional: Negative for fever and chills.  ?HENT: Negative.   ?Eyes: Negative for blurred vision.  ?Respiratory: as per HPI  ?Cardiovascular: Negative for chest pain and palpitations.  ?Gastrointestinal: Negative for vomiting, diarrhea, blood per rectum. ?Genitourinary: Negative for dysuria, urgency, frequency and hematuria.  ?Musculoskeletal: Negative for myalgias, back pain and joint pain.  ?Skin: Negative for itching and rash.  ?Neurological: Negative for dizziness, tremors, focal weakness, seizures and loss of consciousness.  ?Endo/Heme/Allergies: Negative for environmental allergies.  ?Psychiatric/Behavioral: Negative for depression, suicidal ideas and hallucinations.  ?All other systems reviewed and are negative. ? ?Physical Exam: ?Blood pressure 124/66, pulse 92, temperature 98.1 ?F (36.7 ?C), temperature source Oral, height 5' 3.5" (1.613 m), weight 259 lb (117.5 kg), SpO2 98 %. ?Gen:      No acute distress ?HEENT:  EOMI, sclera anicteric ?Neck:     No masses; no thyromegaly ?Lungs:    Clear to auscultation bilaterally; normal respiratory effort ?CV:         Regular  rate and rhythm; no murmurs ?Abd:      + bowel sounds; soft, non-tender; no palpable masses, no distension ?Ext:    No edema; adequate peripheral perfusion ?Skin:      Warm and dry; no rash ?Neuro: alert and oriente

## 2021-12-27 NOTE — Patient Instructions (Signed)
Full PFT performed today. °

## 2021-12-27 NOTE — Patient Instructions (Addendum)
I am glad you are stable with regard to breathing ?We will order CT chest without contrast in 6 months ?Follow-up in clinic after scan ?

## 2021-12-31 DIAGNOSIS — M170 Bilateral primary osteoarthritis of knee: Secondary | ICD-10-CM | POA: Diagnosis not present

## 2022-01-05 ENCOUNTER — Telehealth: Payer: Self-pay | Admitting: Gastroenterology

## 2022-01-05 NOTE — Telephone Encounter (Signed)
Please schedule patient at [facility] endoscopy unit next available appointment. Thanks ?

## 2022-01-05 NOTE — Telephone Encounter (Signed)
-----   Message from Osvaldo Angst, CRNA sent at 01/05/2022 11:45 AM EDT ----- ?Regarding: [place] pt ?Dr. Silverio Decamp, ? ?This pt is scheduled with you on 01/12/2022.  In 2020 she was dx'd as a difficult intubation and her procedure will need to be done at the hospital. ? ?Thanks, ? ?Osvaldo Angst ? ?  ? ? ?Procedure Name: Intubation ?Date/Time: 09/11/2019 8:41 AM ?Performed by: Janace Litten, CRNA ?Pre-anesthesia Checklist: Patient identified, Emergency Drugs available, Suction available and Patient being monitored ?Patient Re-evaluated:Patient Re-evaluated prior to induction ?Oxygen Delivery Method: Circle System Utilized ?Preoxygenation: Pre-oxygenation with 100% oxygen ?Induction Type: IV induction and Rapid sequence ?Ventilation: Mask ventilation without difficulty ?Laryngoscope Size: Glidescope and 3 ?Grade View: Grade I ?Tube type: Oral ?Tube size: 7.0 mm ?Number of attempts: 1 ?Airway Equipment and Method: Stylet ?Placement Confirmation: ETT inserted through vocal cords under direct vision,  positive ETCO2 and breath sounds checked- equal and bilateral ?Secured at: 21 cm ?Tube secured with: Tape ?Dental Injury: Teeth and Oropharynx as per pre-operative assessment  ?Difficulty Due To: Difficult Airway- due to anterior larynx and Difficult Airway- due to limited oral opening ?Future Recommendations: Recommend- induction with short-acting agent, and alternative techniques readily available ?Comments: Pt states after last general anesthetic her "front caps felt loose." Chose to electively use glidescope due to airway assessment. ? ? ? ? ? ?

## 2022-01-06 ENCOUNTER — Other Ambulatory Visit: Payer: Self-pay | Admitting: Internal Medicine

## 2022-01-06 NOTE — Telephone Encounter (Signed)
Patient returned your call and asked you to call her back at [phone].  Thank you. ?

## 2022-01-06 NOTE — Telephone Encounter (Signed)
First opening for her at [facility] will be June 15. ?

## 2022-01-06 NOTE — Telephone Encounter (Signed)
Patient advised. States understanding of the precautions. ?She is not sure June is a good month for her.  ?

## 2022-01-06 NOTE — Telephone Encounter (Signed)
Called the patient to discuss. No answer. Left her a message of my call. Due to the short notice, asked she call me back asap. ?

## 2022-01-12 ENCOUNTER — Encounter: Payer: BC Managed Care – PPO | Admitting: Gastroenterology

## 2022-01-21 ENCOUNTER — Other Ambulatory Visit: Payer: Self-pay

## 2022-01-21 DIAGNOSIS — Z86010 Personal history of colonic polyps: Secondary | ICD-10-CM

## 2022-01-21 MED ORDER — NA SULFATE-K SULFATE-MG SULF 17.5-3.13-1.6 GM/177ML PO SOLN: ORAL | 0 refills | Status: DC

## 2022-01-21 NOTE — Telephone Encounter (Signed)
Can patient go for her colonoscopy on 01/31/22 at [facility]? ?

## 2022-01-24 ENCOUNTER — Telehealth: Payer: Self-pay

## 2022-01-24 ENCOUNTER — Encounter (HOSPITAL_COMMUNITY): Payer: Self-pay | Admitting: Gastroenterology

## 2022-01-24 DIAGNOSIS — M5412 Radiculopathy, cervical region: Secondary | ICD-10-CM | POA: Diagnosis not present

## 2022-01-24 NOTE — Telephone Encounter (Signed)
Tried to call the patient about the procedure scheduled for 01/31/22. ?No answer. Goes to [facility]. Will move the procedure off the schedule for now. ?Closing this note ?

## 2022-01-24 NOTE — Telephone Encounter (Signed)
Patient was scheduled for a colonoscopy in the [facility] but had to be canceled due to concerns of a difficult airway. Patient is unable to make the appointment at [facility] 01/31/22 due to no care partner. Discussed possible dates. She is unable to do the procedure before 03/24/22. Asks for a July appointment at [place] for her colonoscopy with Dr Silverio Decamp.  ?Understands we will have to wait for the schedule to be released. States her understanding and will expect a call asap after the schedule is released. ?

## 2022-01-24 NOTE — Telephone Encounter (Signed)
Patient is returning your call.  

## 2022-01-25 ENCOUNTER — Encounter: Payer: Self-pay | Admitting: Internal Medicine

## 2022-01-25 ENCOUNTER — Ambulatory Visit: Payer: BC Managed Care – PPO | Admitting: Internal Medicine

## 2022-01-25 VITALS — BP 126/76 | HR 71 | Temp 97.9°F | Ht 63.0 in | Wt 260.0 lb

## 2022-01-25 DIAGNOSIS — R7303 Prediabetes: Secondary | ICD-10-CM

## 2022-01-25 DIAGNOSIS — I10 Essential (primary) hypertension: Secondary | ICD-10-CM

## 2022-01-25 DIAGNOSIS — R519 Headache, unspecified: Secondary | ICD-10-CM | POA: Diagnosis not present

## 2022-01-25 DIAGNOSIS — I5031 Acute diastolic (congestive) heart failure: Secondary | ICD-10-CM | POA: Insufficient documentation

## 2022-01-25 DIAGNOSIS — N1831 Chronic kidney disease, stage 3a: Secondary | ICD-10-CM | POA: Diagnosis not present

## 2022-01-25 DIAGNOSIS — Z6841 Body Mass Index (BMI) 40.0 and over, adult: Secondary | ICD-10-CM

## 2022-01-25 MED ORDER — AZITHROMYCIN 250 MG PO TABS: ORAL_TABLET | ORAL | 1 refills | Status: AC

## 2022-01-25 MED ORDER — METOLAZONE 2.5 MG PO TABS: ORAL_TABLET | ORAL | 3 refills | Status: DC

## 2022-01-25 NOTE — Progress Notes (Signed)
Patient ID: Suzanne Hansen, female   DOB: 01-Feb-1956, 66 y.o.   MRN: [id number] ? ? ? ?    Chief Complaint: follow up chf, hyperglycemia, obesity, ckd, left face pain ? ?     HPI:  Suzanne Hansen is a 66 y.o. female here with c/o worsening LE swelling; drives car daily with Les dependent, has 7 lb increase wt in past month, tneds to take her diuretic at night to avoid taking while driving for work, only wears compresion stockings occasionally, has had nsaid use some increased recently, hx of ckd, and vein varicosities on exam.  Pt denies chest pain, increased sob or doe, wheezing, orthopnea, PND, palpitations, dizziness or syncope.   Pt denies polydipsia, polyuria, or new focal neuro s/s.    Pt denies fever, wt loss, night sweats, loss of appetite, or other constitutional symptoms  Also c/o 2 wks onset left faical pain lancinating mild to mod, involving primarily the left maxillary sinus area, without fever or overt sinus congestion it seems, maybe some worse to talk and chew.   ?      ?Wt Readings from Last 3 Encounters:  ?01/25/22 260 lb (117.9 kg)  ?12/27/21 261 lb (118.4 kg)  ?12/24/21 253 lb (114.8 kg)  ? ?BP Readings from Last 3 Encounters:  ?01/25/22 126/76  ?12/27/21 136/74  ?12/06/21 122/74  ? ?      ?Past Medical History:  ?Diagnosis Date  ? ALLERGIC RHINITIS 05/09/2008  ? Allergy   ? ANXIETY 05/23/2007  ? ARM PAIN, LEFT 10/23/2009  ? Arthritis   ? Asthma   ? ASTHMA 05/23/2007  ? Back pain   ? Cardiomyopathy ([place]) 09/08/2015  ? Cataract   ? Chest pain   ? CHF (congestive heart failure) ([place])   ? Chronic diastolic heart failure ([place]) 01/22/2020  ? Constipation   ? CONTACT DERMATITIS 06/09/2009  ? Diabetes mellitus without complication ([place])   ? type II  ? Dyspnea   ? allergies-dust, cig smoke, rag weeds  ? ENDOMETRIOSIS NOS 05/23/2007  ? FACIAL PAIN 06/02/2010  ? Food allergy   ? FREQUENCY, URINARY 05/17/2010  ? GERD (gastroesophageal reflux disease)   ? GLUCOSE INTOLERANCE 08/28/2009  ? HERPES SIMPLEX,  UNCOMPLICATED 20/94/7096  ? Hyperlipidemia 08/28/2015  ? HYPERTHYROIDISM 05/23/2007  ? Hypoactive thyroid   ? HYPOTHYROIDISM 05/09/2008  ? Impaired glucose tolerance 01/28/2011  ? INSOMNIA, HX OF 05/23/2007  ? Joint pain   ? LATERAL EPICONDYLITIS, RIGHT 03/10/2009  ? LIPOMA 10/22/2010  ? NECK PAIN 11/10/2010  ? OBESITY 05/23/2007  ? Osteopenia 09/28/2018  ? Plantar fasciitis   ? Both feet  ? Pre-diabetes   ? SHOULDER PAIN, LEFT 05/17/2010  ? SINUSITIS- ACUTE-NOS 11/03/2008  ? SKIN LESION 11/10/2010  ? Sleep apnea   ? Stage III chronic kidney disease ([place])   ? Stage III  ? Unspecified Chest Pain 07/25/2008  ? UNSPECIFIED URTICARIA 06/04/2009  ? URI 08/28/2009  ? UTI 04/29/2008  ? VITAMIN D DEFICIENCY 08/28/2009  ? Wheezing 07/12/2010  ? ?Past Surgical History:  ?Procedure Laterality Date  ? COLONOSCOPY    ? ENDOMETRIAL ABLATION    ? LAMINECTOMY  1996  ? LAPAROTOMY    ? exploratory  ? lower back surgery  07/06/2017  ? cyst  ? POLYPECTOMY    ? s/p endometrial ablation  12/06  ? TUBAL LIGATION    ? UTERINE FIBROID SURGERY    ? WISDOM TOOTH EXTRACTION    ? ? reports that she quit smoking about 34 years  ago. Her smoking use included cigarettes. She has a 6.00 pack-year smoking history. She has never used smokeless tobacco. She reports current alcohol use of about 1.0 standard drink per week. She reports that she does not use drugs. ?family history includes COPD in her father; Cancer in her maternal grandmother; Depression in her mother; Diabetes in her cousin and sister; Heart disease in her cousin and mother; Hypertension in her cousin and father; Kidney disease in her sister. ?Allergies  ?Allergen Reactions  ? Shellfish Allergy Anaphylaxis  ? Clarithromycin Nausea And Vomiting and Nausea Only  ? Latex Itching and Other (See Comments)  ?  ITCHING AND COLD SORES AROUND MOUTH WHEN LATEX GLOVES USED BY THE DENTIST/HYGENTIST  ? Metronidazole Nausea And Vomiting and Nausea Only  ? Zostavax [Zoster Vaccine Live]   ?  Arm  swelled  ? Adhesive [Tape] Dermatitis  ?  Plastic tape   ? Amiloride   ?  Other reaction(s): foot numbness  ? Gadobenate   ? Lisinopril Cough  ? Other Dermatitis  ?  Plastic tape   ? Peanut Oil   ?  Pt says it's ok  ? ?Current Outpatient Medications on File Prior to Visit  ?Medication Sig Dispense Refill  ? albuterol (VENTOLIN HFA) 108 (90 Base) MCG/ACT inhaler TAKE 2 PUFFS BY MOUTH EVERY 4 HOURS AS NEEDED 6.7 each 11  ? allopurinol (ZYLOPRIM) 100 MG tablet TAKE 2 TABLETS (200 MG TOTAL) BY MOUTH DAILY. TAKE 200 MG BY MOUTH DAILY. 180 tablet 3  ? aspirin 81 MG EC tablet Take 81 mg by mouth daily.    ? estradiol (VIVELLE-DOT) 0.05 MG/24HR patch 1 patch 2 (two) times a week.    ? ezetimibe (ZETIA) 10 MG tablet TAKE 1 TABLET BY MOUTH EVERY DAY 90 tablet 3  ? fexofenadine (ALLEGRA) 180 MG tablet Take 180 mg by mouth daily.    ? gabapentin (NEURONTIN) 100 MG capsule Take 1 capsule (100 mg total) by mouth 2 (two) times daily as needed. 180 capsule 2  ? KLOR-CON M20 20 MEQ tablet Take 1 tablet (20 mEq total) by mouth daily. OK TO TAKE AN EXTRA ON DAYS YOU TAKE EXTRA TORSEMIDE 180 tablet 3  ? levothyroxine (SYNTHROID) 137 MCG tablet TAKE 1 TABLET BY MOUTH DAILY BEFORE BREAKFAST. 90 tablet 3  ? losartan (COZAAR) 25 MG tablet Take 12.5 mg by mouth daily.    ? Na Sulfate-K Sulfate-Mg Sulf 17.5-3.13-1.6 GM/177ML SOLN Take twice as directed by physician instructions 354 mL 0  ? progesterone (PROMETRIUM) 100 MG capsule Take 100 mg by mouth daily.    ? SYMBICORT 160-4.5 MCG/ACT inhaler Inhale into the lungs.    ? torsemide (DEMADEX) 20 MG tablet Take 60 mg by mouth daily. Takes '60mg'$  daily    ? ?No current facility-administered medications on file prior to visit.  ? ?     ROS:  All others reviewed and negative. ? ?Objective  ? ?     PE:  BP 126/76 (BP Location: Left Arm, Patient Position: Sitting, Cuff Size: Large)   Pulse 71   Temp 97.9 ?F (36.6 ?C) (Oral)   Ht '5\' 3"'$  (1.6 m)   Wt 260 lb (117.9 kg)   SpO2 99%   BMI 46.06 kg/m?   ? ?              Constitutional: Pt appears in NAD ?              HENT: Head: NCAT.  ?  Right Ear: External ear normal.   ?              Left Ear: External ear normal.  ?              Eyes: . Pupils are equal, round, and reactive to light. Conjunctivae and EOM are normal ?              Nose: without d/c or deformity ?              Neck: Neck supple. Gross normal ROM ?              Cardiovascular: Normal rate and regular rhythm.   ?              Pulmonary/Chest: Effort normal and breath sounds without rales or wheezing.  ?              Abd:  Soft, NT, ND, + BS, no organomegaly ?              Neurological: Pt is alert. At baseline orientation, motor grossly intact ?              Skin: Skin is warm. No rashes, no other new lesions, LE edema - 2+ bilat LE's ?              Psychiatric: Pt behavior is normal without agitation  ? ?Micro: none ? ?Cardiac tracings I have personally interpreted today:  none ? ?Pertinent Radiological findings (summarize): none  ? ?Lab Results  ?Component Value Date  ? WBC 10.0 10/20/2021  ? HGB 13.5 10/20/2021  ? HCT 40.8 10/20/2021  ? PLT 330 10/20/2021  ? GLUCOSE 95 11/22/2021  ? CHOL 195 10/20/2021  ? TRIG 60 10/20/2021  ? HDL 102 10/20/2021  ? [place] 82 10/20/2021  ? ALT 32 10/20/2021  ? AST 27 10/20/2021  ? NA 142 11/22/2021  ? K 4.1 11/22/2021  ? CL 101 11/22/2021  ? CREATININE 1.35 (H) 11/22/2021  ? BUN 22 11/22/2021  ? CO2 24 11/22/2021  ? TSH 6.440 (H) 10/20/2021  ? INR 1.0 03/21/2017  ? HGBA1C 5.7 (H) 10/20/2021  ? MICROALBUR <0.7 10/02/2019  ? ?Assessment/Plan:  ?Suzanne Hansen is a 67 y.o. Black or African American [2] female with  has a past medical history of ALLERGIC RHINITIS (05/09/2008), Allergy, ANXIETY (05/23/2007), ARM PAIN, LEFT (10/23/2009), Arthritis, Asthma, ASTHMA (05/23/2007), Back pain, Cardiomyopathy ([place]) (09/08/2015), Cataract, Chest pain, CHF (congestive heart failure) ([place]), Chronic diastolic heart failure ([place]) (01/22/2020), Constipation, CONTACT  DERMATITIS (06/09/2009), Diabetes mellitus without complication ([place]), Dyspnea, ENDOMETRIOSIS NOS (05/23/2007), FACIAL PAIN (06/02/2010), Food allergy, FREQUENCY, URINARY (05/17/2010), GERD (gastroesophageal reflux

## 2022-01-25 NOTE — Patient Instructions (Addendum)
Please take all new medication as prescribed - the antibiotic, and the extra fluid pill three times per week ? ?Please call for new medication called tegretol if it seems like the left face pain is getting worse ? ?Please continue all other medications as before, and refills have been done if requested. ? ?Please have the pharmacy call with any other refills you may need. ? ?Please keep your appointments with your specialists as you may have planned ? ?Please make an Appointment to return in 3 months, or sooner if needed,  ?

## 2022-01-28 ENCOUNTER — Telehealth: Payer: Self-pay | Admitting: Internal Medicine

## 2022-01-28 MED ORDER — OZEMPIC (0.25 OR 0.5 MG/DOSE) 2 MG/1.5ML ~~LOC~~ SOPN: 0.5000 mg | PEN_INJECTOR | SUBCUTANEOUS | 3 refills | Status: DC

## 2022-01-28 NOTE — Telephone Encounter (Signed)
Ok to try the ozempic - done erx ?

## 2022-01-28 NOTE — Telephone Encounter (Signed)
Pt requesting a cb to discuss weight loss and if provider received notes from endo and if the notes will qualify her for a rx for weight loss medication ? ?Pt states she has loss 10 lbs of fluid since being prescribed metolazone (ZAROXOLYN) 2.5 MG tablet ?

## 2022-01-28 NOTE — Telephone Encounter (Signed)
Patient notified

## 2022-01-29 ENCOUNTER — Encounter: Payer: Self-pay | Admitting: Internal Medicine

## 2022-01-29 NOTE — Assessment & Plan Note (Deleted)
With exacerbation, for metolozone 2.5 mg only m-w-f prn only wt gain ?

## 2022-01-29 NOTE — Assessment & Plan Note (Signed)
Lab Results  ?Component Value Date  ? HGBA1C 5.7 (H) 10/20/2021  ? ?Stable, pt to continue current medical treatment  - diet ? ?

## 2022-01-29 NOTE — Assessment & Plan Note (Signed)
Lab Results  ?Component Value Date  ? CREATININE 1.35 (H) 11/22/2021  ? ?Stable overall, cont to avoid nephrotoxins ? ?

## 2022-01-29 NOTE — Assessment & Plan Note (Signed)
With exacerbation, for metolozone 2.5 mg only m-w-f prn only wt gain ?

## 2022-01-29 NOTE — Assessment & Plan Note (Signed)
Cont diet, excercise ?

## 2022-01-29 NOTE — Assessment & Plan Note (Signed)
BP Readings from Last 3 Encounters:  ?01/25/22 126/76  ?12/27/21 136/74  ?12/06/21 122/74  ? ?Stable, pt to continue medical treatment losartan ? ?

## 2022-01-29 NOTE — Assessment & Plan Note (Signed)
?   Sinusitis vs trigeminal neuralgia - for empiric antibx, consider trigeminal neuralgia if not improved ?

## 2022-01-31 ENCOUNTER — Ambulatory Visit (HOSPITAL_COMMUNITY)
Admission: RE | Admit: 2022-01-31 | Payer: BC Managed Care – PPO | Source: Home / Self Care | Admitting: Gastroenterology

## 2022-01-31 ENCOUNTER — Encounter (HOSPITAL_COMMUNITY): Admission: RE | Payer: Self-pay | Source: Home / Self Care

## 2022-01-31 ENCOUNTER — Encounter: Payer: Self-pay | Admitting: Internal Medicine

## 2022-01-31 SURGERY — COLONOSCOPY WITH PROPOFOL
Anesthesia: Monitor Anesthesia Care

## 2022-02-02 DIAGNOSIS — M25572 Pain in left ankle and joints of left foot: Secondary | ICD-10-CM | POA: Diagnosis not present

## 2022-02-02 DIAGNOSIS — S82409A Unspecified fracture of shaft of unspecified fibula, initial encounter for closed fracture: Secondary | ICD-10-CM | POA: Insufficient documentation

## 2022-02-02 DIAGNOSIS — S82435A Nondisplaced oblique fracture of shaft of left fibula, initial encounter for closed fracture: Secondary | ICD-10-CM | POA: Diagnosis not present

## 2022-02-09 ENCOUNTER — Other Ambulatory Visit: Payer: Self-pay

## 2022-02-09 DIAGNOSIS — Z86010 Personal history of colonic polyps: Secondary | ICD-10-CM

## 2022-02-09 NOTE — Telephone Encounter (Signed)
Patient agrees to this date. She has had her instructional visit with the nurse. Patient expresses confidence in managing her prep without another visit. Instructions mailed to her. ?Patient's questions invited and answered to the best of my ability. ?

## 2022-02-09 NOTE — Telephone Encounter (Signed)
Left message for the patient to call back. ?Her colonoscopy will be 04/25/22 in the [facility]. Arrive at 7:45 am. ?Asked the patient to call back to confirm. Offered an appointment with the nurse to review her instructions. ?

## 2022-02-15 ENCOUNTER — Other Ambulatory Visit: Payer: Self-pay | Admitting: Cardiovascular Disease

## 2022-02-15 NOTE — Telephone Encounter (Signed)
Rx(s) sent to pharmacy electronically.  

## 2022-02-23 DIAGNOSIS — M5412 Radiculopathy, cervical region: Secondary | ICD-10-CM | POA: Diagnosis not present

## 2022-02-23 DIAGNOSIS — M25572 Pain in left ankle and joints of left foot: Secondary | ICD-10-CM | POA: Diagnosis not present

## 2022-02-23 DIAGNOSIS — S82435A Nondisplaced oblique fracture of shaft of left fibula, initial encounter for closed fracture: Secondary | ICD-10-CM | POA: Diagnosis not present

## 2022-03-16 DIAGNOSIS — E559 Vitamin D deficiency, unspecified: Secondary | ICD-10-CM | POA: Diagnosis not present

## 2022-03-16 DIAGNOSIS — M25572 Pain in left ankle and joints of left foot: Secondary | ICD-10-CM | POA: Diagnosis not present

## 2022-03-16 DIAGNOSIS — S82435D Nondisplaced oblique fracture of shaft of left fibula, subsequent encounter for closed fracture with routine healing: Secondary | ICD-10-CM | POA: Diagnosis not present

## 2022-03-18 ENCOUNTER — Telehealth: Payer: Self-pay

## 2022-03-18 DIAGNOSIS — E559 Vitamin D deficiency, unspecified: Secondary | ICD-10-CM

## 2022-03-18 NOTE — Telephone Encounter (Signed)
Pt states that Dr. Baldwin [place] @ [facility] states that she needs Vit D lab to see if she should be getting a stronger Rx for Vit D.  Pt has a broken Fibula that she is see them for. The healing process has been slow and steady but think its due to Vit D deficiency.  Pt also states she is scheduled to come back in July and wants to know if you would like her to get Thyroid labs completed at this time.  Please advise

## 2022-03-23 NOTE — Telephone Encounter (Signed)
[place] lab is ordered to be done anytime, and she can go to the [place] lab without appt if she likes

## 2022-03-24 ENCOUNTER — Telehealth: Payer: Self-pay

## 2022-03-24 ENCOUNTER — Other Ambulatory Visit (HOSPITAL_COMMUNITY): Payer: Self-pay

## 2022-03-24 ENCOUNTER — Encounter: Payer: Self-pay | Admitting: Internal Medicine

## 2022-03-24 ENCOUNTER — Other Ambulatory Visit (INDEPENDENT_AMBULATORY_CARE_PROVIDER_SITE_OTHER): Payer: BC Managed Care – PPO

## 2022-03-24 DIAGNOSIS — R7303 Prediabetes: Secondary | ICD-10-CM | POA: Diagnosis not present

## 2022-03-24 DIAGNOSIS — E785 Hyperlipidemia, unspecified: Secondary | ICD-10-CM | POA: Diagnosis not present

## 2022-03-24 DIAGNOSIS — M109 Gout, unspecified: Secondary | ICD-10-CM | POA: Diagnosis not present

## 2022-03-24 DIAGNOSIS — E538 Deficiency of other specified B group vitamins: Secondary | ICD-10-CM

## 2022-03-24 DIAGNOSIS — E559 Vitamin D deficiency, unspecified: Secondary | ICD-10-CM | POA: Diagnosis not present

## 2022-03-24 LAB — HEPATIC FUNCTION PANEL
ALT: 14 U/L (ref 0–35)
AST: 16 U/L (ref 0–37)
Albumin: 4.1 g/dL (ref 3.5–5.2)
Alkaline Phosphatase: 80 U/L (ref 39–117)
Bilirubin, Direct: 0.2 mg/dL (ref 0.0–0.3)
Total Bilirubin: 0.6 mg/dL (ref 0.2–1.2)
Total Protein: 7 g/dL (ref 6.0–8.3)

## 2022-03-24 LAB — LIPID PANEL
Cholesterol: 141 mg/dL (ref 0–200)
HDL: 58.3 mg/dL (ref >39.00)
LDL Cholesterol: 61 mg/dL (ref 0–99)
NonHDL: 82.52
Total CHOL/HDL Ratio: 2
Triglycerides: 110 mg/dL (ref 0.0–149.0)
VLDL: 22 mg/dL (ref 0.0–40.0)

## 2022-03-24 LAB — CBC WITH DIFFERENTIAL/PLATELET
Basophils Absolute: 0 10*3/uL (ref 0.0–0.1)
Basophils Relative: 0.5 % (ref 0.0–3.0)
Eosinophils Absolute: 0.1 10*3/uL (ref 0.0–0.7)
Eosinophils Relative: 1.3 % (ref 0.0–5.0)
HCT: 41 % (ref 36.0–46.0)
Hemoglobin: 13.4 g/dL (ref 12.0–15.0)
Lymphocytes Relative: 27.2 % (ref 12.0–46.0)
Lymphs Abs: 2.2 10*3/uL (ref 0.7–4.0)
MCHC: 32.7 g/dL (ref 30.0–36.0)
MCV: 88.5 fl (ref 78.0–100.0)
Monocytes Absolute: 0.7 10*3/uL (ref 0.1–1.0)
Monocytes Relative: 8.8 % (ref 3.0–12.0)
Neutro Abs: 5.1 10*3/uL (ref 1.4–7.7)
Neutrophils Relative %: 62.2 % (ref 43.0–77.0)
Platelets: 292 10*3/uL (ref 150.0–400.0)
RBC: 4.63 Mil/uL (ref 3.87–5.11)
RDW: 14.1 % (ref 11.5–15.5)
WBC: 8.2 10*3/uL (ref 4.0–10.5)

## 2022-03-24 LAB — URINALYSIS, ROUTINE W REFLEX MICROSCOPIC
Bilirubin Urine: NEGATIVE
Hgb urine dipstick: NEGATIVE
Ketones, ur: NEGATIVE
Nitrite: NEGATIVE
RBC / HPF: NONE SEEN (ref 0–?)
Specific Gravity, Urine: <=1.005 — AB (ref 1.000–1.030)
Total Protein, Urine: NEGATIVE
Urine Glucose: NEGATIVE
Urobilinogen, UA: 0.2 (ref 0.0–1.0)
pH: 6 (ref 5.0–8.0)

## 2022-03-24 LAB — HEMOGLOBIN A1C: Hgb A1c MFr Bld: 5.7 % (ref 4.6–6.5)

## 2022-03-24 LAB — BASIC METABOLIC PANEL
BUN: 38 mg/dL — ABNORMAL HIGH (ref 6–23)
CO2: 33 mEq/L — ABNORMAL HIGH (ref 19–32)
Calcium: 10.9 mg/dL — ABNORMAL HIGH (ref 8.4–10.5)
Chloride: 92 mEq/L — ABNORMAL LOW (ref 96–112)
Creatinine, Ser: 1.88 mg/dL — ABNORMAL HIGH (ref 0.40–1.20)
GFR: 27.82 mL/min — ABNORMAL LOW (ref >60.00)
Glucose, Bld: 111 mg/dL — ABNORMAL HIGH (ref 70–99)
Potassium: 2.6 mEq/L — CL (ref 3.5–5.1)
Sodium: 138 mEq/L (ref 135–145)

## 2022-03-24 LAB — TSH: TSH: 2.34 u[IU]/mL (ref 0.35–5.50)

## 2022-03-24 LAB — VITAMIN D 25 HYDROXY (VIT D DEFICIENCY, FRACTURES): VITD: 58.35 ng/mL (ref 30.00–100.00)

## 2022-03-24 LAB — VITAMIN B12: Vitamin B-12: 643 pg/mL (ref 211–911)

## 2022-03-24 LAB — URIC ACID: Uric Acid, Serum: 6.6 mg/dL (ref 2.4–7.0)

## 2022-03-24 MED ORDER — BUDESONIDE-FORMOTEROL FUMARATE 160-4.5 MCG/ACT IN AERO
INHALATION_SPRAY | RESPIRATORY_TRACT | 9 refills | Status: DC
  Filled 2022-03-24: qty 10.2, 20d supply, fill #0
  Filled 2022-03-24: qty 10.2, 30d supply, fill #0
  Filled 2022-05-07: qty 10.2, 30d supply, fill #1

## 2022-03-24 NOTE — Telephone Encounter (Signed)
Patient states that she is taking 1 '20mg'$  potassium pill per day, 2 torsemide daily, and she only took the Metolazone yesterday. Patient also states that she is feeling tired and has mid back pain on her left side.

## 2022-03-24 NOTE — Telephone Encounter (Addendum)
Ok to ask pt to take 4 total pills of potassium tomorrow Friday (no matter what else she takes), then 3 pills on Sat AND Sun, then back to usual dose on Monday, but also to check repeat BMP on Monday or soonest she is able - I will place the order, and let me know if she needs rx for more potassium  Also the creatine is higher, which means the fluids pill recently have been a bit too much for her system.  Please hold on taking the metolazone, continue all else for now

## 2022-03-24 NOTE — Telephone Encounter (Signed)
Please to contact patient  Clarify how much K she actually takes, AND the torsemide and zoroxyln as well

## 2022-03-25 ENCOUNTER — Other Ambulatory Visit (HOSPITAL_COMMUNITY): Payer: Self-pay

## 2022-03-25 ENCOUNTER — Telehealth: Payer: Self-pay | Admitting: Cardiovascular Disease

## 2022-03-25 MED ORDER — ALBUTEROL SULFATE HFA 108 (90 BASE) MCG/ACT IN AERS
INHALATION_SPRAY | RESPIRATORY_TRACT | 11 refills | Status: DC
  Filled 2022-03-25: qty 6.7, 16d supply, fill #0
  Filled 2022-05-07: qty 6.7, 16d supply, fill #1
  Filled 2022-06-22: qty 6.7, 16d supply, fill #2
  Filled 2022-07-27: qty 6.7, 16d supply, fill #3
  Filled 2022-08-30: qty 6.7, 16d supply, fill #4
  Filled 2022-09-22: qty 6.7, 16d supply, fill #5
  Filled 2022-11-11: qty 6.7, 16d supply, fill #6
  Filled 2023-01-10: qty 6.7, 16d supply, fill #7
  Filled 2023-02-25: qty 6.7, 16d supply, fill #8

## 2022-03-25 MED ORDER — OZEMPIC (0.25 OR 0.5 MG/DOSE) 2 MG/3ML ~~LOC~~ SOPN
0.5000 mg | PEN_INJECTOR | SUBCUTANEOUS | 3 refills | Status: DC
  Filled 2022-03-25: qty 9, 84d supply, fill #0

## 2022-03-25 MED ORDER — LOSARTAN POTASSIUM 25 MG PO TABS
12.5000 mg | ORAL_TABLET | Freq: Every day | ORAL | 1 refills | Status: DC
  Filled 2022-03-25 – 2022-04-11 (×2): qty 45, 90d supply, fill #0
  Filled 2022-07-04: qty 45, 90d supply, fill #1

## 2022-03-25 NOTE — Addendum Note (Signed)
Addended by: Terence Lux A on: 03/25/2022 09:21 AM   Modules accepted: Orders

## 2022-03-25 NOTE — Telephone Encounter (Signed)
*  STAT* If patient is at the pharmacy, call can be transferred to refill team.   1. Which medications need to be refilled? (please list name of each medication and dose if known)  losartan (COZAAR) 25 MG tablet  2. Which pharmacy/location (including street and city if local pharmacy) is medication to be sent to? [facility]  3. Do they need a 30 day or 90 day supply?  90 day supply

## 2022-03-25 NOTE — Telephone Encounter (Signed)
Patient has been notified

## 2022-03-25 NOTE — Telephone Encounter (Signed)
Rx request sent to pharmacy.  

## 2022-03-26 DIAGNOSIS — M5412 Radiculopathy, cervical region: Secondary | ICD-10-CM | POA: Diagnosis not present

## 2022-03-28 ENCOUNTER — Other Ambulatory Visit (HOSPITAL_COMMUNITY): Payer: Self-pay

## 2022-03-30 ENCOUNTER — Other Ambulatory Visit (INDEPENDENT_AMBULATORY_CARE_PROVIDER_SITE_OTHER): Payer: BC Managed Care – PPO

## 2022-03-30 ENCOUNTER — Other Ambulatory Visit: Payer: Self-pay | Admitting: Internal Medicine

## 2022-03-30 DIAGNOSIS — E876 Hypokalemia: Secondary | ICD-10-CM | POA: Diagnosis not present

## 2022-03-30 DIAGNOSIS — E559 Vitamin D deficiency, unspecified: Secondary | ICD-10-CM | POA: Diagnosis not present

## 2022-03-30 LAB — BASIC METABOLIC PANEL
BUN: 17 mg/dL (ref 6–23)
CO2: 31 mEq/L (ref 19–32)
Calcium: 9.7 mg/dL (ref 8.4–10.5)
Chloride: 98 mEq/L (ref 96–112)
Creatinine, Ser: 1.31 mg/dL — ABNORMAL HIGH (ref 0.40–1.20)
GFR: 42.92 mL/min — ABNORMAL LOW (ref >60.00)
Glucose, Bld: 90 mg/dL (ref 70–99)
Potassium: 3.1 mEq/L — ABNORMAL LOW (ref 3.5–5.1)
Sodium: 139 mEq/L (ref 135–145)

## 2022-03-30 LAB — VITAMIN D 25 HYDROXY (VIT D DEFICIENCY, FRACTURES): VITD: 55.3 ng/mL (ref 30.00–100.00)

## 2022-04-04 DIAGNOSIS — M25562 Pain in left knee: Secondary | ICD-10-CM | POA: Diagnosis not present

## 2022-04-04 DIAGNOSIS — M25561 Pain in right knee: Secondary | ICD-10-CM | POA: Diagnosis not present

## 2022-04-04 DIAGNOSIS — M1711 Unilateral primary osteoarthritis, right knee: Secondary | ICD-10-CM | POA: Diagnosis not present

## 2022-04-04 DIAGNOSIS — M1712 Unilateral primary osteoarthritis, left knee: Secondary | ICD-10-CM | POA: Diagnosis not present

## 2022-04-04 DIAGNOSIS — M170 Bilateral primary osteoarthritis of knee: Secondary | ICD-10-CM | POA: Diagnosis not present

## 2022-04-05 ENCOUNTER — Other Ambulatory Visit (HOSPITAL_COMMUNITY): Payer: Self-pay

## 2022-04-11 ENCOUNTER — Other Ambulatory Visit (HOSPITAL_COMMUNITY): Payer: Self-pay

## 2022-04-14 ENCOUNTER — Other Ambulatory Visit (HOSPITAL_COMMUNITY): Payer: Self-pay

## 2022-04-14 MED FILL — Levothyroxine Sodium Tab 137 MCG: ORAL | 90 days supply | Qty: 90 | Fill #0 | Status: CN

## 2022-04-15 DIAGNOSIS — M25572 Pain in left ankle and joints of left foot: Secondary | ICD-10-CM | POA: Diagnosis not present

## 2022-04-15 DIAGNOSIS — S82435D Nondisplaced oblique fracture of shaft of left fibula, subsequent encounter for closed fracture with routine healing: Secondary | ICD-10-CM | POA: Diagnosis not present

## 2022-04-18 ENCOUNTER — Encounter (HOSPITAL_COMMUNITY): Payer: Self-pay | Admitting: Gastroenterology

## 2022-04-18 DIAGNOSIS — E119 Type 2 diabetes mellitus without complications: Secondary | ICD-10-CM | POA: Diagnosis not present

## 2022-04-18 NOTE — Progress Notes (Signed)
Attempted to obtain medical history via telephone, unable to reach at this time. HIPAA compliant voicemail message left requesting return call to pre surgical testing department. 

## 2022-04-21 DIAGNOSIS — R102 Pelvic and perineal pain: Secondary | ICD-10-CM | POA: Diagnosis not present

## 2022-04-21 DIAGNOSIS — N760 Acute vaginitis: Secondary | ICD-10-CM | POA: Diagnosis not present

## 2022-04-21 DIAGNOSIS — Z113 Encounter for screening for infections with a predominantly sexual mode of transmission: Secondary | ICD-10-CM | POA: Diagnosis not present

## 2022-04-25 ENCOUNTER — Encounter (HOSPITAL_COMMUNITY)
Admission: RE | Disposition: A | Discharge status: Home / Self Care | Payer: Self-pay | Source: Ambulatory Visit | Attending: Gastroenterology

## 2022-04-25 ENCOUNTER — Ambulatory Visit (HOSPITAL_COMMUNITY): Payer: BC Managed Care – PPO | Admitting: Certified Registered"

## 2022-04-25 ENCOUNTER — Other Ambulatory Visit (HOSPITAL_COMMUNITY): Payer: Self-pay

## 2022-04-25 ENCOUNTER — Other Ambulatory Visit: Payer: Self-pay

## 2022-04-25 ENCOUNTER — Encounter (HOSPITAL_COMMUNITY): Payer: Self-pay | Admitting: Gastroenterology

## 2022-04-25 ENCOUNTER — Ambulatory Visit (HOSPITAL_COMMUNITY)
Admission: RE | Admit: 2022-04-25 | Discharge: 2022-04-25 | Disposition: A | Discharge status: Home / Self Care | Payer: BC Managed Care – PPO | Source: Ambulatory Visit | Attending: Gastroenterology | Admitting: Gastroenterology

## 2022-04-25 DIAGNOSIS — K648 Other hemorrhoids: Secondary | ICD-10-CM | POA: Diagnosis not present

## 2022-04-25 DIAGNOSIS — Z6841 Body Mass Index (BMI) 40.0 and over, adult: Secondary | ICD-10-CM | POA: Diagnosis not present

## 2022-04-25 DIAGNOSIS — I1310 Hypertensive heart and chronic kidney disease without heart failure, with stage 1 through stage 4 chronic kidney disease, or unspecified chronic kidney disease: Secondary | ICD-10-CM | POA: Insufficient documentation

## 2022-04-25 DIAGNOSIS — Z86010 Personal history of colonic polyps: Secondary | ICD-10-CM | POA: Insufficient documentation

## 2022-04-25 DIAGNOSIS — N189 Chronic kidney disease, unspecified: Secondary | ICD-10-CM | POA: Diagnosis not present

## 2022-04-25 DIAGNOSIS — E1151 Type 2 diabetes mellitus with diabetic peripheral angiopathy without gangrene: Secondary | ICD-10-CM | POA: Diagnosis not present

## 2022-04-25 DIAGNOSIS — Z87891 Personal history of nicotine dependence: Secondary | ICD-10-CM | POA: Insufficient documentation

## 2022-04-25 DIAGNOSIS — E039 Hypothyroidism, unspecified: Secondary | ICD-10-CM | POA: Insufficient documentation

## 2022-04-25 DIAGNOSIS — G4730 Sleep apnea, unspecified: Secondary | ICD-10-CM | POA: Insufficient documentation

## 2022-04-25 DIAGNOSIS — K644 Residual hemorrhoidal skin tags: Secondary | ICD-10-CM | POA: Diagnosis not present

## 2022-04-25 DIAGNOSIS — D122 Benign neoplasm of ascending colon: Secondary | ICD-10-CM | POA: Insufficient documentation

## 2022-04-25 DIAGNOSIS — I5032 Chronic diastolic (congestive) heart failure: Secondary | ICD-10-CM | POA: Diagnosis not present

## 2022-04-25 DIAGNOSIS — K219 Gastro-esophageal reflux disease without esophagitis: Secondary | ICD-10-CM | POA: Diagnosis not present

## 2022-04-25 DIAGNOSIS — Z7985 Long-term (current) use of injectable non-insulin antidiabetic drugs: Secondary | ICD-10-CM | POA: Diagnosis not present

## 2022-04-25 DIAGNOSIS — D120 Benign neoplasm of cecum: Secondary | ICD-10-CM | POA: Insufficient documentation

## 2022-04-25 DIAGNOSIS — Z09 Encounter for follow-up examination after completed treatment for conditions other than malignant neoplasm: Secondary | ICD-10-CM

## 2022-04-25 DIAGNOSIS — K635 Polyp of colon: Secondary | ICD-10-CM | POA: Diagnosis not present

## 2022-04-25 DIAGNOSIS — J45909 Unspecified asthma, uncomplicated: Secondary | ICD-10-CM | POA: Insufficient documentation

## 2022-04-25 DIAGNOSIS — K5730 Diverticulosis of large intestine without perforation or abscess without bleeding: Secondary | ICD-10-CM | POA: Diagnosis not present

## 2022-04-25 HISTORY — PX: POLYPECTOMY: SHX5525

## 2022-04-25 HISTORY — PX: COLONOSCOPY WITH PROPOFOL: SHX5780

## 2022-04-25 LAB — GLUCOSE, CAPILLARY
Glucose-Capillary: 92 mg/dL (ref 70–99)
Glucose-Capillary: 89 mg/dL (ref 70–99)

## 2022-04-25 SURGERY — COLONOSCOPY WITH PROPOFOL
Anesthesia: Monitor Anesthesia Care

## 2022-04-25 MED ORDER — PANTOPRAZOLE SODIUM 40 MG PO TBEC
40.0000 mg | DELAYED_RELEASE_TABLET | Freq: Every day | ORAL | 3 refills | Status: DC
  Filled 2022-04-25: qty 90, 90d supply, fill #0

## 2022-04-25 MED ORDER — PROPOFOL 10 MG/ML IV BOLUS
INTRAVENOUS | Status: DC | PRN
  Administered 2022-04-25 (×4): 20 mg via INTRAVENOUS

## 2022-04-25 MED ORDER — LACTATED RINGERS IV SOLN: INTRAVENOUS | Status: DC

## 2022-04-25 MED ORDER — PROPOFOL 500 MG/50ML IV EMUL
INTRAVENOUS | Status: DC | PRN
  Administered 2022-04-25: 135 ug/kg/min via INTRAVENOUS

## 2022-04-25 MED ORDER — PROPOFOL 10 MG/ML IV BOLUS
INTRAVENOUS | Status: AC
  Filled 2022-04-25: qty 20

## 2022-04-25 SURGICAL SUPPLY — 22 items

## 2022-04-25 NOTE — Discharge Instructions (Signed)

## 2022-04-25 NOTE — Transfer of Care (Signed)
Immediate Anesthesia Transfer of Care Note  Patient: Suzanne Hansen  Procedure(s) Performed: COLONOSCOPY WITH PROPOFOL POLYPECTOMY  Patient Location: PACU  Anesthesia Type:MAC  Level of Consciousness: awake, alert , oriented and patient cooperative  Airway & Oxygen Therapy: Patient Spontanous Breathing and Patient connected to face mask oxygen  Post-op Assessment: Report given to RN and Post -op Vital signs reviewed and stable  Post vital signs: Reviewed and stable  Last Vitals:  Vitals Value Taken Time  BP 160/93 04/25/22 0924  Temp    Pulse 95 04/25/22 0926  Resp 25 04/25/22 0926  SpO2 100 % 04/25/22 0926  Vitals shown include unvalidated device data.  Last Pain:  Vitals:   04/25/22 0831  TempSrc: Temporal  PainSc: 0-No pain         Complications: No notable events documented.

## 2022-04-25 NOTE — Anesthesia Procedure Notes (Signed)
Procedure Name: MAC Date/Time: 04/25/2022 8:46 AM  Performed by: Niel Hummer, CRNAPre-anesthesia Checklist: Patient identified, Emergency Drugs available, Suction available and Patient being monitored Oxygen Delivery Method: Simple face mask

## 2022-04-25 NOTE — Anesthesia Preprocedure Evaluation (Addendum)
Anesthesia Evaluation  Patient identified by MRN, date of birth, ID band Patient awake    Reviewed: Allergy & Precautions, NPO status , Patient's Chart, lab work & pertinent test results  Airway Mallampati: IV  TM Distance: >3 FB Neck ROM: Full    Dental no notable dental hx. (+) Teeth Intact, Dental Advisory Given, Caps,    Pulmonary shortness of breath and with exertion, asthma (Controlled with inhalers ) , sleep apnea and Continuous Positive Airway Pressure Ventilation , former smoker,    Pulmonary exam normal breath sounds clear to auscultation       Cardiovascular hypertension, + Peripheral Vascular Disease and +CHF  Normal cardiovascular exam Rhythm:Regular Rate:Normal  Imaging in PA-C note   Neuro/Psych  Headaches, Anxiety  Neuromuscular disease    GI/Hepatic Neg liver ROS, GERD  Medicated and Controlled,  Endo/Other  diabetesHypothyroidism Morbid obesity  Renal/GU CRFRenal disease     Musculoskeletal negative musculoskeletal ROS (+)   Abdominal   Peds  Hematology HLD   Anesthesia Other Findings Spondylosis  Reproductive/Obstetrics                            Anesthesia Physical  Anesthesia Plan  ASA: 3  Anesthesia Plan: MAC   Post-op Pain Management: Minimal or no pain anticipated   Induction: Intravenous  PONV Risk Score and Plan: 3 and Propofol infusion  Airway Management Planned: Natural Airway and Nasal Cannula  Additional Equipment: None  Intra-op Plan:   Post-operative Plan:   Informed Consent:     Dental advisory given  Plan Discussed with: CRNA and Anesthesiologist  Anesthesia Plan Comments: (Reviewed PAT note written 09/04/2019 by Myra Gianotti, PA-C. History includes former smoker (quit 1989), non-ischemic cardiomyopathy (EF 48%, normal stress test 09/2013, EF 40-45% 08/2015, EF 60-65% 05/2018), CHF, DM2, HLD, GERD, asthma, CKD stage III,  hyperthyroidism->acquired hypothyroidism, OSA (CPAP), dyspnea, chest pain (history of, negative stress test 2014, 2018). BMI is consistent with morbid obesity.  )       Anesthesia Quick Evaluation

## 2022-04-25 NOTE — Op Note (Signed)
[facility] Patient Name: Suzanne Hansen Procedure Date: 04/25/2022 MRN: [id number] Attending MD: Mauri Pole , MD Date of Birth: 1956-01-16 CSN: [id number] Age: 66 Admit Type: Outpatient Procedure:                Colonoscopy Indications:              High risk colon cancer surveillance: Personal                            history of colonic polyps, High risk colon cancer                            surveillance: Personal history of sessile serrated                            colon polyp (less than 10 mm in size) with no                            dysplasia Providers:                Mauri Pole, MD, Truddie Coco, RN, William Dalton, Technician Referring MD:              Medicines:                Monitored Anesthesia Care Complications:            No immediate complications. Estimated Blood Loss:     Estimated blood loss was minimal. Procedure:                Pre-Anesthesia Assessment:                           - Prior to the procedure, a History and Physical                            was performed, and patient medications and                            allergies were reviewed. The patient's tolerance of                            previous anesthesia was also reviewed. The risks                            and benefits of the procedure and the sedation                            options and risks were discussed with the patient.                            All questions were answered, and informed consent  was obtained. Prior Anticoagulants: The patient has                            taken no previous anticoagulant or antiplatelet                            agents. ASA Grade Assessment: III - A patient with                            severe systemic disease. After reviewing the risks                            and benefits, the patient was deemed in                            satisfactory condition to  undergo the procedure.                           After obtaining informed consent, the colonoscope                            was passed under direct vision. Throughout the                            procedure, the patient's blood pressure, pulse, and                            oxygen saturations were monitored continuously. The                            PCF-HQ190L ([id number]) Olympus colonoscope was                            introduced through the anus and advanced to the the                            cecum, identified by appendiceal orifice and                            ileocecal valve. The colonoscopy was performed                            without difficulty. The patient tolerated the                            procedure well. The quality of the bowel                            preparation was good. The ileocecal valve,                            appendiceal orifice, and rectum were photographed. Scope In: 8:52:57 AM Scope Out: 9:17:28 AM Scope Withdrawal Time: 0 hours 20 minutes 56 seconds  Total Procedure Duration: 0  hours 24 minutes 31 seconds  Findings:      The perianal and digital rectal examinations were normal.      Four sessile polyps were found in the ascending colon and cecum. The       polyps were 3 to 5 mm in size. These polyps were removed with a cold       snare. Resection and retrieval were complete.      A few small-mouthed diverticula were found in the sigmoid colon and       descending colon.      Non-bleeding external and internal hemorrhoids were found during       retroflexion. The hemorrhoids were small. Impression:               - Four 3 to 5 mm polyps in the ascending colon and                            in the cecum, removed with a cold snare. Resected                            and retrieved.                           - Diverticulosis in the sigmoid colon and in the                            descending colon.                           - Non-bleeding  external and internal hemorrhoids. Moderate Sedation:      Not Applicable - Patient had care per Anesthesia. Recommendation:           - Patient has a contact number available for                            emergencies. The signs and symptoms of potential                            delayed complications were discussed with the                            patient. Return to normal activities tomorrow.                            Written discharge instructions were provided to the                            patient.                           - Resume previous diet.                           - Continue present medications.                           - Await pathology results.                           -  Repeat colonoscopy in 3 - 5 years for                            surveillance based on pathology results. Procedure Code(s):        --- Professional ---                           [phone], Colonoscopy, flexible; with removal of                            tumor(s), polyp(s), or other lesion(s) by snare                            technique Diagnosis Code(s):        --- Professional ---                           K63.5, Polyp of colon                           K64.8, Other hemorrhoids                           Z86.010, Personal history of colonic polyps                           K57.30, Diverticulosis of large intestine without                            perforation or abscess without bleeding CPT copyright 2019 American Medical Association. All rights reserved. The codes documented in this report are preliminary and upon coder review may  be revised to meet current compliance requirements. Mauri Pole, MD 04/25/2022 9:33:40 AM This report has been signed electronically. Number of Addenda: 0

## 2022-04-25 NOTE — H&P (Signed)
[facility] History and Physical   Primary Care Physician:  Biagio Borg, MD   Reason for Procedure:   H/o adenomatous colon polyps  Plan:    Colonoscopy with possible intervention     HPI: Suzanne Hansen is a 66 y.o. female here for surveillance colonoscopy for h/o adenomatous colon polyps.  The risks and benefits as well as alternatives of endoscopic procedure(s) have been discussed and reviewed. All questions answered. The patient agrees to proceed.    Past Medical History:  Diagnosis Date   ALLERGIC RHINITIS 05/09/2008   Allergy    ANXIETY 05/23/2007   ARM PAIN, LEFT 10/23/2009   Arthritis    Asthma    ASTHMA 05/23/2007   Back pain    Cardiomyopathy ([place]) 09/08/2015   Cataract    Chest pain    CHF (congestive heart failure) (H[facility])    Chronic diastolic heart failure ([place]) 01/22/2020   Constipation    CONTACT DERMATITIS 06/09/2009   Diabetes mellitus without complication ([place])    type II   Dyspnea    allergies-dust, cig smoke, rag weeds   ENDOMETRIOSIS NOS 05/23/2007   FACIAL PAIN 06/02/2010   Food allergy    FREQUENCY, URINARY 05/17/2010   GERD (gastroesophageal reflux disease)    GLUCOSE INTOLERANCE 08/28/2009   HERPES SIMPLEX, UNCOMPLICATED 87/56/4332   Hyperlipidemia 08/28/2015   HYPERTHYROIDISM 05/23/2007   Hypoactive thyroid    HYPOTHYROIDISM 05/09/2008   Impaired glucose tolerance 01/28/2011   INSOMNIA, HX OF 05/23/2007   Joint pain    LATERAL EPICONDYLITIS, RIGHT 03/10/2009   LIPOMA 10/22/2010   NECK PAIN 11/10/2010   OBESITY 05/23/2007   Osteopenia 09/28/2018   Plantar fasciitis    Both feet   Pre-diabetes    SHOULDER PAIN, LEFT 05/17/2010   SINUSITIS- ACUTE-NOS 11/03/2008   SKIN LESION 11/10/2010   Sleep apnea    Stage III chronic kidney disease ([place])    Stage III   Unspecified Chest Pain 07/25/2008   UNSPECIFIED URTICARIA 06/04/2009   URI 08/28/2009   UTI 04/29/2008   VITAMIN D DEFICIENCY 08/28/2009   Wheezing  07/12/2010    Past Surgical History:  Procedure Laterality Date   COLONOSCOPY     ENDOMETRIAL ABLATION     LAMINECTOMY  1996   LAPAROTOMY     exploratory   lower back surgery  07/06/2017   cyst   POLYPECTOMY     s/p endometrial ablation  12/06   TUBAL LIGATION     UTERINE FIBROID SURGERY     WISDOM TOOTH EXTRACTION      Prior to Admission medications   Medication Sig Start Date End Date Taking? Authorizing Provider  albuterol (VENTOLIN HFA) 108 (90 Base) MCG/ACT inhaler INHALE 2 PUFFS BY MOUTH EVERY 4 HOURS AS NEEDED Patient taking differently: 1-2 puffs every 4 (four) hours as needed for wheezing or shortness of breath. 03/25/22  Yes Biagio Borg, MD  aspirin 81 MG EC tablet Take 81 mg by mouth daily.   Yes [provider]  budesonide-formoterol (SYMBICORT) 160-4.5 MCG/ACT inhaler Inhale 2 puffs into the lungs in the morning and at night. Patient taking differently: Inhale 1 puff into the lungs daily. 08/19/21  Yes Biagio Borg, MD  ezetimibe (ZETIA) 10 MG tablet TAKE 1 TABLET BY MOUTH EVERY DAY 11/12/21  Yes Biagio Borg, MD  allopurinol (ZYLOPRIM) 100 MG tablet TAKE 2 TABLETS (200 MG TOTAL) BY MOUTH DAILY. 12/15/21   Biagio Borg, MD  estradiol (VIVELLE-DOT) 0.05 MG/24HR patch 1 patch  2 (two) times a week. 07/31/20   [provider]  fexofenadine (ALLEGRA) 180 MG tablet Take 180 mg by mouth daily.    [provider]  gabapentin (NEURONTIN) 100 MG capsule Take 1 capsule (100 mg total) by mouth 2 (two) times daily as needed. 12/30/20   Gregor Hams, MD  KLOR-CON M20 20 MEQ tablet TAKE 1 TABLET (20 MEQ TOTAL) BY MOUTH DAILY. OK TO TAKE AN EXTRA ON DAYS YOU TAKE EXTRA TORSEMIDE 02/15/22   Skeet Latch, MD  levothyroxine (SYNTHROID) 137 MCG tablet TAKE 1 TABLET BY MOUTH DAILY BEFORE BREAKFAST. 10/26/21   Biagio Borg, MD  losartan (COZAAR) 25 MG tablet Take 1/2 tablet (12.5 mg total) by mouth daily. 03/25/22   Skeet Latch, MD  meloxicam (MOBIC) 15 MG  tablet Take 15 mg by mouth daily. 01/15/22   [provider]  metolazone (ZAROXOLYN) 2.5 MG tablet 1 tab by mouth three times weekly as needed 01/25/22   Biagio Borg, MD  Na Sulfate-K Sulfate-Mg Sulf 17.5-3.13-1.6 GM/177ML SOLN Take twice as directed by physician instructions 01/21/22   Mauri Pole, MD  progesterone (PROMETRIUM) 100 MG capsule Take 100 mg by mouth daily. 07/31/20   [provider]  Semaglutide,0.25 or 0.'5MG'$ /DOS, (OZEMPIC, 0.25 OR 0.5 MG/DOSE,) 2 MG/3ML SOPN Inject 0.5 mg into the skin once a week. 03/25/22   Biagio Borg, MD  SYMBICORT 160-4.5 MCG/ACT inhaler Inhale into the lungs. 11/15/21   [provider]  torsemide (DEMADEX) 20 MG tablet Take 60 mg by mouth daily. Takes '60mg'$  daily 11/11/21   [provider]  valACYclovir (VALTREX) 1000 MG tablet Take by mouth. 01/03/22   [provider]    No current facility-administered medications for this encounter.    Allergies as of 02/09/2022 - Review Complete 01/29/2022  Allergen Reaction Noted   Shellfish allergy Anaphylaxis 08/09/2018   Clarithromycin Nausea And Vomiting and Nausea Only 09/09/2015   Latex Itching and Other (See Comments) 01/02/2013   Metronidazole Nausea And Vomiting and Nausea Only 05/23/2007   Zostavax [zoster vaccine live]  09/27/2017   Adhesive [tape] Dermatitis 09/02/2019   Amiloride  01/13/2021   Gadobenate  01/13/2021   Lisinopril Cough 08/09/2018   Other Dermatitis 09/02/2019   Peanut oil  12/17/2018    Family History  Problem Relation Age of Onset   Heart disease Mother        Rheumatic heart disease   Depression Mother    COPD Father    Hypertension Father    Diabetes Sister    Kidney disease Sister    Cancer Maternal Grandmother        Breast   Diabetes Cousin    Hypertension Cousin    Heart disease Cousin    Colon cancer Neg Hx    Esophageal cancer Neg Hx    Rectal cancer Neg Hx    Stomach cancer Neg Hx     Social History    Socioeconomic History   Marital status: Single    Spouse name: Not on file   Number of children: 3   Years of education: Not on file   Highest education level: Not on file  Occupational History   Occupation: SOCIAL WORKER    Employer: [employer]    Comment: [place]   Occupation: Water engineer for preschool infants  Tobacco Use   Smoking status: Former    Packs/day: 0.50    Years: 12.00    Total pack years: 6.00  Types: Cigarettes    Quit date: 11/05/1987    Years since quitting: 34.4   Smokeless tobacco: Never  Vaping Use   Vaping Use: Never used  Substance and Sexual Activity   Alcohol use: Yes    Alcohol/week: 1.0 standard drink of alcohol    Types: 1 Glasses of wine per week    Comment: WINE   Drug use: No   Sexual activity: Not on file  Other Topics Concern   Not on file  Social History Narrative   Not on file   Social Determinants of Health   Financial Resource Strain: Not on file  Food Insecurity: Not on file  Transportation Needs: Not on file  Physical Activity: Not on file  Stress: Not on file  Social Connections: Not on file  Intimate Partner Violence: Not on file    Review of Systems:  All other review of systems negative except as mentioned in the HPI.  Physical Exam: Vital signs in last 24 hours:     General:   Alert, NAD Lungs:  Clear .   Heart:  Regular rate and rhythm Abdomen:  Soft, nontender and nondistended. Neuro/Psych:  Alert and cooperative. Normal mood and affect. A and O x 3   K. Denzil Magnuson , MD [phone]

## 2022-04-25 NOTE — Anesthesia Postprocedure Evaluation (Signed)
Anesthesia Post Note  Patient: Suzanne Hansen  Procedure(s) Performed: COLONOSCOPY WITH PROPOFOL POLYPECTOMY     Patient location during evaluation: PACU Anesthesia Type: MAC Level of consciousness: awake and alert Pain management: pain level controlled Vital Signs Assessment: post-procedure vital signs reviewed and stable Respiratory status: spontaneous breathing, nonlabored ventilation, respiratory function stable and patient connected to nasal cannula oxygen Cardiovascular status: stable and blood pressure returned to baseline Postop Assessment: no apparent nausea or vomiting Anesthetic complications: no   No notable events documented.  Last Vitals:  Vitals:   04/25/22 0940 04/25/22 0950  BP: 136/88 (!) 142/79  Pulse: 86 85  Resp: 18 15  Temp:    SpO2: (!) 85% 97%    Last Pain:  Vitals:   04/25/22 0950  TempSrc:   PainSc: 0-No pain                 Osbaldo Mark

## 2022-04-26 ENCOUNTER — Other Ambulatory Visit (HOSPITAL_COMMUNITY): Payer: Self-pay

## 2022-04-26 LAB — SURGICAL PATHOLOGY

## 2022-04-26 MED FILL — Levothyroxine Sodium Tab 137 MCG: ORAL | 90 days supply | Qty: 90 | Fill #0 | Status: AC

## 2022-04-26 MED FILL — Allopurinol Tab 100 MG: ORAL | 90 days supply | Qty: 180 | Fill #0 | Status: AC

## 2022-04-27 ENCOUNTER — Ambulatory Visit: Payer: BC Managed Care – PPO | Admitting: Internal Medicine

## 2022-04-29 ENCOUNTER — Other Ambulatory Visit (HOSPITAL_COMMUNITY): Payer: Self-pay

## 2022-05-06 ENCOUNTER — Ambulatory Visit: Payer: BC Managed Care – PPO | Admitting: Internal Medicine

## 2022-05-06 ENCOUNTER — Encounter: Payer: Self-pay | Admitting: Internal Medicine

## 2022-05-06 VITALS — BP 120/62 | HR 84 | Temp 98.2°F | Ht 64.0 in | Wt 235.0 lb

## 2022-05-06 DIAGNOSIS — N1831 Chronic kidney disease, stage 3a: Secondary | ICD-10-CM

## 2022-05-06 DIAGNOSIS — E559 Vitamin D deficiency, unspecified: Secondary | ICD-10-CM | POA: Diagnosis not present

## 2022-05-06 DIAGNOSIS — E7800 Pure hypercholesterolemia, unspecified: Secondary | ICD-10-CM

## 2022-05-06 DIAGNOSIS — E538 Deficiency of other specified B group vitamins: Secondary | ICD-10-CM

## 2022-05-06 DIAGNOSIS — R7303 Prediabetes: Secondary | ICD-10-CM | POA: Diagnosis not present

## 2022-05-06 DIAGNOSIS — I10 Essential (primary) hypertension: Secondary | ICD-10-CM

## 2022-05-06 DIAGNOSIS — Z6841 Body Mass Index (BMI) 40.0 and over, adult: Secondary | ICD-10-CM

## 2022-05-06 NOTE — Progress Notes (Unsigned)
Patient ID: Suzanne Hansen, female   DOB: 05/18/1956, 66 y.o.   MRN: [id number]        Chief Complaint: follow up HTN, HLD and hyperglycemia, obesity       HPI:  [name] is a 66 y.o. female here overall doing ok, Pt denies chest pain, increased sob or doe, wheezing, orthopnea, PND, increased LE swelling, palpitations, dizziness or syncope.   Pt denies polydipsia, polyuria, or new focal neuro s/s.    Pt denies fever, wt loss, night sweats, loss of appetite, or other constitutional symptoms   Wt much improved with ozempic.  Due for bilateral cataract surgury soon.   Wt Readings from Last 3 Encounters:  05/06/22 235 lb (106.6 kg)  04/25/22 238 lb (108 kg)  01/25/22 260 lb (117.9 kg)   BP Readings from Last 3 Encounters:  05/06/22 120/62  04/25/22 (!) 142/79  01/25/22 126/76         Past Medical History:  Diagnosis Date   ALLERGIC RHINITIS 05/09/2008   Allergy    ANXIETY 05/23/2007   ARM PAIN, LEFT 10/23/2009   Arthritis    Asthma    ASTHMA 05/23/2007   Back pain    Cardiomyopathy ([place]) 09/08/2015   Cataract    Chest pain    CHF (congestive heart failure) (H[facility])    Chronic diastolic heart failure ([place]) 01/22/2020   Constipation    CONTACT DERMATITIS 06/09/2009   Diabetes mellitus without complication ([place])    type II   Dyspnea    allergies-dust, cig smoke, rag weeds   ENDOMETRIOSIS NOS 05/23/2007   FACIAL PAIN 06/02/2010   Food allergy    FREQUENCY, URINARY 05/17/2010   GERD (gastroesophageal reflux disease)    GLUCOSE INTOLERANCE 08/28/2009   HERPES SIMPLEX, UNCOMPLICATED 63/84/6659   Hyperlipidemia 08/28/2015   HYPERTHYROIDISM 05/23/2007   Hypoactive thyroid    HYPOTHYROIDISM 05/09/2008   Impaired glucose tolerance 01/28/2011   INSOMNIA, HX OF 05/23/2007   Joint pain    LATERAL EPICONDYLITIS, RIGHT 03/10/2009   LIPOMA 10/22/2010   NECK PAIN 11/10/2010   OBESITY 05/23/2007   Osteopenia 09/28/2018   Plantar fasciitis    Both feet   Pre-diabetes    SHOULDER  PAIN, LEFT 05/17/2010   SINUSITIS- ACUTE-NOS 11/03/2008   SKIN LESION 11/10/2010   Sleep apnea    Stage III chronic kidney disease ([place])    Stage III   Unspecified Chest Pain 07/25/2008   UNSPECIFIED URTICARIA 06/04/2009   URI 08/28/2009   UTI 04/29/2008   VITAMIN D DEFICIENCY 08/28/2009   Wheezing 07/12/2010   Past Surgical History:  Procedure Laterality Date   COLONOSCOPY     COLONOSCOPY WITH PROPOFOL N/A 04/25/2022   Procedure: COLONOSCOPY WITH PROPOFOL;  Surgeon: Mauri Pole, MD;  Location: [facility];  Service: Gastroenterology;  Laterality: N/A;   ENDOMETRIAL ABLATION     LAMINECTOMY  1996   LAPAROTOMY     exploratory   lower back surgery  07/06/2017   cyst   POLYPECTOMY     POLYPECTOMY  04/25/2022   Procedure: POLYPECTOMY;  Surgeon: Mauri Pole, MD;  Location: [facility];  Service: Gastroenterology;;   s/p endometrial ablation  12/06   TUBAL LIGATION     UTERINE FIBROID SURGERY     WISDOM TOOTH EXTRACTION      reports that she quit smoking about 34 years ago. Her smoking use included cigarettes. She has a 6.00 pack-year smoking history. She has never used smokeless tobacco. She reports current alcohol use of  about 1.0 standard drink of alcohol per week. She reports that she does not use drugs. family history includes COPD in her father; Cancer in her maternal grandmother; Depression in her mother; Diabetes in her cousin and sister; Heart disease in her cousin and mother; Hypertension in her cousin and father; Kidney disease in her sister. Allergies  Allergen Reactions   Shellfish Allergy Anaphylaxis   Clarithromycin Nausea And Vomiting   Latex Itching and Other (See Comments)    ITCHING AND COLD SORES AROUND MOUTH WHEN LATEX GLOVES USED BY THE DENTIST/HYGENTIST   Metronidazole Nausea And Vomiting   Zostavax [Zoster Vaccine Live]     Arm swelled   Adhesive [Tape] Dermatitis    Plastic tape    Amiloride     foot numbness   Clindamycin Nausea Only  and Nausea And Vomiting   Gadobenate     Pt has had some form of dye without issues but isnt sure if she is allergic to this specific agent    Lisinopril Cough   Current Outpatient Medications on File Prior to Visit  Medication Sig Dispense Refill   albuterol (VENTOLIN HFA) 108 (90 Base) MCG/ACT inhaler INHALE 2 PUFFS BY MOUTH EVERY 4 HOURS AS NEEDED (Patient taking differently: 1-2 puffs every 4 (four) hours as needed for wheezing or shortness of breath.) 6.7 g 11   allopurinol (ZYLOPRIM) 100 MG tablet TAKE 2 TABLETS (200 MG TOTAL) BY MOUTH DAILY. 180 tablet 3   aspirin 81 MG EC tablet Take 81 mg by mouth daily.     budesonide-formoterol (SYMBICORT) 160-4.5 MCG/ACT inhaler Inhale 2 puffs into the lungs in the morning and at night. (Patient taking differently: Inhale 1 puff into the lungs daily.) 10.2 g 9   ezetimibe (ZETIA) 10 MG tablet TAKE 1 TABLET BY MOUTH EVERY DAY 90 tablet 3   fexofenadine (ALLEGRA) 180 MG tablet Take 180 mg by mouth daily.     gabapentin (NEURONTIN) 100 MG capsule Take 1 capsule (100 mg total) by mouth 2 (two) times daily as needed. 180 capsule 2   KLOR-CON M20 20 MEQ tablet TAKE 1 TABLET (20 MEQ TOTAL) BY MOUTH DAILY. OK TO TAKE AN EXTRA ON DAYS YOU TAKE EXTRA TORSEMIDE 180 tablet 2   levothyroxine (SYNTHROID) 137 MCG tablet TAKE 1 TABLET BY MOUTH DAILY BEFORE BREAKFAST. 90 tablet 3   losartan (COZAAR) 25 MG tablet Take 1/2 tablet (12.5 mg total) by mouth daily. 45 tablet 1   pantoprazole (PROTONIX) 40 MG tablet Take 1 tablet (40 mg total) by mouth daily. 90 tablet 3   Semaglutide,0.25 or 0.'5MG'$ /DOS, (OZEMPIC, 0.25 OR 0.5 MG/DOSE,) 2 MG/3ML SOPN Inject 0.5 mg into the skin once a week. 12 mL 3   SYMBICORT 160-4.5 MCG/ACT inhaler Inhale into the lungs.     torsemide (DEMADEX) 20 MG tablet Take 60 mg by mouth daily. Takes '60mg'$  daily     No current facility-administered medications on file prior to visit.        ROS:  All others reviewed and negative.  Objective         PE:  BP 120/62 (BP Location: Right Arm, Patient Position: Sitting, Cuff Size: Large)   Pulse 84   Temp 98.2 F (36.8 C) (Oral)   Ht '5\' 4"'$  (1.626 m)   Wt 235 lb (106.6 kg)   SpO2 100%   BMI 40.34 kg/m                 Constitutional: Pt appears in NAD  HENT: Head: NCAT.                Right Ear: External ear normal.                 Left Ear: External ear normal.                Eyes: . Pupils are equal, round, and reactive to light. Conjunctivae and EOM are normal               Nose: without d/c or deformity               Neck: Neck supple. Gross normal ROM               Cardiovascular: Normal rate and regular rhythm.                 Pulmonary/Chest: Effort normal and breath sounds without rales or wheezing.                Abd:  Soft, NT, ND, + BS, no organomegaly               Neurological: Pt is alert. At baseline orientation, motor grossly intact               Skin: Skin is warm. No rashes, no other new lesions, LE edema - none               Psychiatric: Pt behavior is normal without agitation   Micro: none  Cardiac tracings I have personally interpreted today:  none  Pertinent Radiological findings (summarize): none   Lab Results  Component Value Date   WBC 8.2 03/24/2022   HGB 13.4 03/24/2022   HCT 41.0 03/24/2022   PLT 292.0 03/24/2022   GLUCOSE 90 03/30/2022   CHOL 141 03/24/2022   TRIG 110.0 03/24/2022   HDL 58.30 03/24/2022   LDLCALC 61 03/24/2022   ALT 14 03/24/2022   AST 16 03/24/2022   NA 139 03/30/2022   K 3.1 (L) 03/30/2022   CL 98 03/30/2022   CREATININE 1.31 (H) 03/30/2022   BUN 17 03/30/2022   CO2 31 03/30/2022   TSH 2.34 03/24/2022   INR 1.0 03/21/2017   HGBA1C 5.7 03/24/2022   MICROALBUR <0.7 10/02/2019   Assessment/Plan:  Moni Scinto is a 66 y.o. Black or African American [2] female with  has a past medical history of ALLERGIC RHINITIS (05/09/2008), Allergy, ANXIETY (05/23/2007), ARM PAIN, LEFT (10/23/2009), Arthritis, Asthma,  ASTHMA (05/23/2007), Back pain, Cardiomyopathy ([place]) (09/08/2015), Cataract, Chest pain, CHF (congestive heart failure) ([place]), Chronic diastolic heart failure ([place]) (01/22/2020), Constipation, CONTACT DERMATITIS (06/09/2009), Diabetes mellitus without complication ([place]), Dyspnea, ENDOMETRIOSIS NOS (05/23/2007), FACIAL PAIN (06/02/2010), Food allergy, FREQUENCY, URINARY (05/17/2010), GERD (gastroesophageal reflux disease), GLUCOSE INTOLERANCE (08/28/2009), HERPES SIMPLEX, UNCOMPLICATED (73/41/9379), Hyperlipidemia (08/28/2015), HYPERTHYROIDISM (05/23/2007), Hypoactive thyroid, HYPOTHYROIDISM (05/09/2008), Impaired glucose tolerance (01/28/2011), INSOMNIA, HX OF (05/23/2007), Joint pain, LATERAL EPICONDYLITIS, RIGHT (03/10/2009), LIPOMA (10/22/2010), NECK PAIN (11/10/2010), OBESITY (05/23/2007), Osteopenia (09/28/2018), Plantar fasciitis, Pre-diabetes, SHOULDER PAIN, LEFT (05/17/2010), SINUSITIS- ACUTE-NOS (11/03/2008), SKIN LESION (11/10/2010), Sleep apnea, Stage III chronic kidney disease ([place]), Unspecified Chest Pain (07/25/2008), UNSPECIFIED URTICARIA (06/04/2009), URI (08/28/2009), UTI (04/29/2008), VITAMIN D DEFICIENCY (08/28/2009), and Wheezing (07/12/2010).  No problem-specific Assessment & Plan notes found for this encounter.  Followup: No follow-ups on file.  Cathlean Cower, MD 05/06/2022 4:08 PM [facility]

## 2022-05-07 ENCOUNTER — Other Ambulatory Visit (HOSPITAL_COMMUNITY): Payer: Self-pay

## 2022-05-07 NOTE — Assessment & Plan Note (Signed)
Lab Results  Component Value Date   LDLCALC 61 03/24/2022   Stable, pt to continue current zetia 10 mg qd

## 2022-05-07 NOTE — Assessment & Plan Note (Signed)
BP Readings from Last 3 Encounters:  05/06/22 120/62  04/25/22 (!) 142/79  01/25/22 126/76   Stable, pt to continue medical treatment losartan 12. 5 mg qd

## 2022-05-07 NOTE — Assessment & Plan Note (Signed)
Much improved, to continue ozempic

## 2022-05-07 NOTE — Assessment & Plan Note (Signed)
Lab Results  Component Value Date   HGBA1C 5.7 03/24/2022   Stable, pt to continue current medical treatment  - diet, wt control, excercise

## 2022-05-07 NOTE — Assessment & Plan Note (Signed)
Last vitamin D Lab Results  Component Value Date   VD25OH 55.30 03/30/2022   Stable, cont oral replacement

## 2022-05-07 NOTE — Assessment & Plan Note (Signed)
Lab Results  Component Value Date   CREATININE 1.31 (H) 03/30/2022   Stable overall, cont to avoid nephrotoxins

## 2022-05-07 NOTE — Patient Instructions (Signed)
Please continue all other medications as before, and refills have been done if requested.  Please have the pharmacy call with any other refills you may need.  Please continue your efforts at being more active, low cholesterol diet, and weight control.  Please keep your appointments with your specialists as you may have planned     

## 2022-05-18 ENCOUNTER — Encounter (INDEPENDENT_AMBULATORY_CARE_PROVIDER_SITE_OTHER): Payer: Self-pay

## 2022-05-18 DIAGNOSIS — H2513 Age-related nuclear cataract, bilateral: Secondary | ICD-10-CM | POA: Diagnosis not present

## 2022-05-18 DIAGNOSIS — H2512 Age-related nuclear cataract, left eye: Secondary | ICD-10-CM | POA: Diagnosis not present

## 2022-05-18 DIAGNOSIS — H18413 Arcus senilis, bilateral: Secondary | ICD-10-CM | POA: Diagnosis not present

## 2022-05-18 DIAGNOSIS — E11319 Type 2 diabetes mellitus with unspecified diabetic retinopathy without macular edema: Secondary | ICD-10-CM | POA: Diagnosis not present

## 2022-05-18 DIAGNOSIS — H25013 Cortical age-related cataract, bilateral: Secondary | ICD-10-CM | POA: Diagnosis not present

## 2022-05-18 NOTE — Progress Notes (Unsigned)
PATIENT: Suzanne Hansen DOB: 1956-06-26  REASON FOR VISIT: follow up HISTORY FROM: patient  No chief complaint on file.   HISTORY OF PRESENT ILLNESS:  05/19/2022 ALL: Suzanne Hansen returns for follow up for OSA on CPAP.  She is using CPAP most nights for about 4 hours.   Memory.     05/13/2021 ALL: Suzanne Hansen returns for follow up for OSA on CPAP. She reports doing better on CPAP since reduced pressure settings. She is sleeping fairly well but admits that sleep schedule varies. She likes to read and can stay up til 3am. She naps when she gets home and then can't sleep at night. She feels that memory is the same. She remains employed full time and does well with performance evaluations.     05/13/20 CD: Suzanne Hansen is a 66 y.o. female here today for follow up for OSA on CPAP.  Epworth sleepiness scale: 6 / 24 , FSS at 28/63  Points.   Poor compliance with CPAP- due to insomnia - "I am up at 3 AM and sometimes that's the end of the night" And " When I read in bed I will always fall asleep- and I forget to put the CPAP on many evenings".   Spine fusion in December has reduced the pain. Suzanne Saintclair Halsted. CPAP supplies not longer paid for by insurance. [employer]. She is now Counselling psychologist. Nasal ;pillow replacement.  05/13/2019 ALL:  [name] is a 66 y.o. female here today for follow up for OSA on CPAP.  She returns today for initial compliance report.  She is doing very well and notes improvement in energy and less fatigue during the day.  Compliance report dated 04/09/2019 through 05/08/2019 reveals that she is using CPAP every day for compliance of 100%.  28 out of the last 30 days she used her machine greater than 4 hours for compliance of 93%.  AHI was 2.8 on 16 cm of water and EPR of 3.  There was no significant leak.  She is doing very well today and without concerns.  HISTORY: (copied from Suzanne Dohmeier's note on 09/18/2018)  Suzanne Hansen is a 66 y.o. female , she was originally seen in  May 2017 seen here as a referral from Dentist Suzanne Bennett Scrape, DMD at [facility].  I have the pleasure of seeing Suzanne Hansen today in a revisit, Almost 3 years after his encounter at her last.  Today is 18 September 2018 and Suzanne Hansen reports that her cardiologist Suzanne. Delena Serve P. Sami, DO at [facility] in [place] wants her to be reevaluated for sleep apnea.  The patient had Hansen evaluated in an attended split-night polysomnography on 29 Feb 2016 at the time she had a baseline AHI of 17.3 mostly hypercapnia related, REM AHI was 52.6 she did not have oxygen desaturations of significance.  She was titrated to CPAP at 9 cmH2O the AHI became 0.0 at that setting and she tolerated supine positional sleep without further apneas, she also had a significant amount of REM sleep rebounding.   The patient started CPAP late May 2017 at home but she contracted frequent upper respiratory infections, sinusitis and she suffers from a easily congested nose and sinus symptoms.  She felt that CPAP was not comfortable for her to wear and I had never seen her after the trial of CPAP and she decided to go with a dental appliance instead.  Her daughter now tells her that the dental appliance has not eliminated  her snoring, as she could hear her mother snore.  Her heart failure specialist is also concerned that the dental appliance does nothing to treat the underlying REM dependent apnea, which is true.  Her family has noticed that she continues to have apneas while using the mouthpiece. The mouth piece cannot be adjusted ay further.    I have also the opportunity to read her cardiologist notes, Suzanne Hansen, according to the visit notes Suzanne Hansen is described as a 66 year old female with obesity, with prior mild systolic heart failure but now normal ejection fraction after a right heart catheterization, paroxysmal atrial fibrillation.  Longstanding dyspnea and chest pain she has Hansen on diuretics for several years.   Left ejection fraction had Hansen 40 to 50% since 2014 or longer she is intolerant of lisinopril to the cough, she is not on anticoagulation for paroxysmal atrial fibrillation.  She is a borderline diabetic she is on statin for high lipids, she uses a mouth appliance for sleep apnea.  She was seen in November 2018 for chest pain had a negative stress test was given Lasix in the ED and had not taken her diuretics at home that day.  She received a steroid injection for hip pain, she had no improvement after a nerve ablation but finally found some improvement after the cyst was removed from the lumbar spine, she has an appointment with nephrology based on her cardiology referral.  Her white blood cell count had Hansen 11.3 hemoglobin 12.4 hematocrit 35.9, AST 24, AST 20 sodium 139 potassium 3.8 in August 2019, creatinine was 1.75 which is elevated on May 17, 2018.  BUN was high at 29.   Nephrology visit at [facility] was in 3rd October 2019, the patient was started on allopurinol.      Suzanne Hansen presents with Asthma, recurrent bronchitis, sinusitis,  hypothyroidism and weight gain ( BMI is now 39), and a  diagnosis of cardiomyopathy. Her cardiomyopathy progressed into heart failure, CKD stage 3 , and she developed leg edema. She knows she snores, because her throat is sore and her mouth is dry in AM, her daughters tell her she stops breathing and snores loudly.    Sleep habits are as follows: She usually returns home after work feeling almost ready to go to bed. She will be on her comfort close by 7 or 8 PM average bedtime is around 10 PM. She sleeps on one pillow, does not elevate the head of bed. She falls asleep on her side, but she wakes up she is often on her back. She usually falls asleep easily and she feels that she sleeps through the night some nights, others she will just wake up for no reason.  Sometimes she will gasp for air or seemingly snore herself awake,( mostly when falling asleep in a chair). She  will have three  bathroom breaks at night on average, rarely 4-5 . Wakes up spontaneously at about 6.00 AM but she needs to be up by 7.AM.  No naps in daytime- work is busy. She may doze when not physically active or mentally stimulated and she does often feel sleepier when she drives, helping herself to some caffeine on the way.   Sleep and additional medical history :As a child she used to sleep with a night light on, he sometimes eating a big light. She had some nightmares the usual pre-school or fear of monsters. She has no history of sleepwalking so of true night terrors , but of enuresis.  She  had a cyst removed from her spine 06-2017, and since her left leg bothers her "not as much".   Social history: She is divorced after 25 years of marriage, sleeps alone with her dog.  Adult  daughters, one married, one a Conservation officer, historic buildings, and the youngest one finishes her masters in education.  The youngest lives with her.  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 Caffeine - one cup in AM and rarely uses caffeine -  ETOH rare, less than one a week.  Tobacco use- in college  Sept 1977, and stopped with her first pregnancy - 5 cig a day.   REVIEW OF SYSTEMS: Out of a complete 14 system review of symptoms, the patient complains only of the following symptoms, memory loss, headache and all other reviewed systems are negative.  Epworth sleepiness scale: 9   ALLERGIES: Allergies  Allergen Reactions   Shellfish Allergy Anaphylaxis   Clarithromycin Nausea And Vomiting   Latex Itching and Other (See Comments)    ITCHING AND COLD SORES AROUND MOUTH WHEN LATEX GLOVES USED BY THE DENTIST/HYGENTIST   Metronidazole Nausea And Vomiting   Zostavax [Zoster Vaccine Live]     Arm swelled   Adhesive [Tape] Dermatitis    Plastic tape    Amiloride     foot numbness   Clindamycin Nausea Only and Nausea And Vomiting   Gadobenate     Pt has had some form of dye without  issues but isnt sure if she is allergic to this specific agent    Lisinopril Cough    HOME MEDICATIONS: Outpatient Medications Prior to Visit  Medication Sig Dispense Refill   albuterol (VENTOLIN HFA) 108 (90 Base) MCG/ACT inhaler INHALE 2 PUFFS BY MOUTH EVERY 4 HOURS AS NEEDED (Patient taking differently: 1-2 puffs every 4 (four) hours as needed for wheezing or shortness of breath.) 6.7 g 11   allopurinol (ZYLOPRIM) 100 MG tablet TAKE 2 TABLETS (200 MG TOTAL) BY MOUTH DAILY. 180 tablet 3   aspirin 81 MG EC tablet Take 81 mg by mouth daily.     budesonide-formoterol (SYMBICORT) 160-4.5 MCG/ACT inhaler Inhale 2 puffs into the lungs in the morning and at night. (Patient taking differently: Inhale 1 puff into the lungs daily.) 10.2 g 9   ezetimibe (ZETIA) 10 MG tablet TAKE 1 TABLET BY MOUTH EVERY DAY 90 tablet 3   fexofenadine (ALLEGRA) 180 MG tablet Take 180 mg by mouth daily.     gabapentin (NEURONTIN) 100 MG capsule Take 1 capsule (100 mg total) by mouth 2 (two) times daily as needed. 180 capsule 2   KLOR-CON M20 20 MEQ tablet TAKE 1 TABLET (20 MEQ TOTAL) BY MOUTH DAILY. OK TO TAKE AN EXTRA ON DAYS YOU TAKE EXTRA TORSEMIDE 180 tablet 2   levothyroxine (SYNTHROID) 137 MCG tablet TAKE 1 TABLET BY MOUTH DAILY BEFORE BREAKFAST. 90 tablet 3   losartan (COZAAR) 25 MG tablet Take 1/2 tablet (12.5 mg total) by mouth daily. 45 tablet 1   pantoprazole (PROTONIX) 40 MG tablet Take 1 tablet (40 mg total) by mouth daily. 90 tablet 3   Semaglutide,0.25 or 0.'5MG'$ /DOS, (OZEMPIC, 0.25 OR 0.5 MG/DOSE,) 2 MG/3ML SOPN Inject 0.5 mg into the skin once a week. 12 mL 3   SYMBICORT 160-4.5 MCG/ACT inhaler Inhale into the lungs.     torsemide (DEMADEX) 20 MG tablet Take 60 mg by mouth daily. Takes '60mg'$  daily     No facility-administered medications prior to visit.  PAST MEDICAL HISTORY: Past Medical History:  Diagnosis Date   ALLERGIC RHINITIS 05/09/2008   Allergy    ANXIETY 05/23/2007   ARM PAIN, LEFT  10/23/2009   Arthritis    Asthma    ASTHMA 05/23/2007   Back pain    Cardiomyopathy ([place]) 09/08/2015   Cataract    Chest pain    CHF (congestive heart failure) (HCC)    Chronic diastolic heart failure ([place]) 01/22/2020   Constipation    CONTACT DERMATITIS 06/09/2009   Diabetes mellitus without complication ([place])    type II   Dyspnea    allergies-dust, cig smoke, rag weeds   ENDOMETRIOSIS NOS 05/23/2007   FACIAL PAIN 06/02/2010   Food allergy    FREQUENCY, URINARY 05/17/2010   GERD (gastroesophageal reflux disease)    GLUCOSE INTOLERANCE 08/28/2009   HERPES SIMPLEX, UNCOMPLICATED 67/09/4579   Hyperlipidemia 08/28/2015   HYPERTHYROIDISM 05/23/2007   Hypoactive thyroid    HYPOTHYROIDISM 05/09/2008   Impaired glucose tolerance 01/28/2011   INSOMNIA, HX OF 05/23/2007   Joint pain    LATERAL EPICONDYLITIS, RIGHT 03/10/2009   LIPOMA 10/22/2010   NECK PAIN 11/10/2010   OBESITY 05/23/2007   Osteopenia 09/28/2018   Plantar fasciitis    Both feet   Pre-diabetes    SHOULDER PAIN, LEFT 05/17/2010   SINUSITIS- ACUTE-NOS 11/03/2008   SKIN LESION 11/10/2010   Sleep apnea    Stage III chronic kidney disease ([place])    Stage III   Unspecified Chest Pain 07/25/2008   UNSPECIFIED URTICARIA 06/04/2009   URI 08/28/2009   UTI 04/29/2008   VITAMIN D DEFICIENCY 08/28/2009   Wheezing 07/12/2010    PAST SURGICAL HISTORY: Past Surgical History:  Procedure Laterality Date   COLONOSCOPY     COLONOSCOPY WITH PROPOFOL N/A 04/25/2022   Procedure: COLONOSCOPY WITH PROPOFOL;  Surgeon: Mauri Pole, MD;  Location: [facility];  Service: Gastroenterology;  Laterality: N/A;   ENDOMETRIAL ABLATION     LAMINECTOMY  1996   LAPAROTOMY     exploratory   lower back surgery  07/06/2017   cyst   POLYPECTOMY     POLYPECTOMY  04/25/2022   Procedure: POLYPECTOMY;  Surgeon: Mauri Pole, MD;  Location: [facility];  Service: Gastroenterology;;   s/p endometrial ablation  12/06   TUBAL  LIGATION     UTERINE FIBROID SURGERY     WISDOM TOOTH EXTRACTION      FAMILY HISTORY: Family History  Problem Relation Age of Onset   Heart disease Mother        Rheumatic heart disease   Depression Mother    COPD Father    Hypertension Father    Diabetes Sister    Kidney disease Sister    Cancer Maternal Grandmother        Breast   Diabetes Cousin    Hypertension Cousin    Heart disease Cousin    Colon cancer Neg Hx    Esophageal cancer Neg Hx    Rectal cancer Neg Hx    Stomach cancer Neg Hx     SOCIAL HISTORY: Social History   Socioeconomic History   Marital status: Single    Spouse name: Not on file   Number of children: 3   Years of education: Not on file   Highest education level: Not on file  Occupational History   Occupation: SOCIAL WORKER    Employer: [employer]   Occupation: Water engineer for preschool infants  Tobacco Use  Smoking status: Former    Packs/day: 0.50    Years: 12.00    Total pack years: 6.00    Types: Cigarettes    Quit date: 11/05/1987    Years since quitting: 34.5   Smokeless tobacco: Never  Vaping Use   Vaping Use: Never used  Substance and Sexual Activity   Alcohol use: Yes    Alcohol/week: 1.0 standard drink of alcohol    Types: 1 Glasses of wine per week    Comment: WINE   Drug use: No   Sexual activity: Not on file  Other Topics Concern   Not on file  Social History Narrative   Not on file   Social Determinants of Health   Financial Resource Strain: Not on file  Food Insecurity: Not on file  Transportation Needs: Not on file  Physical Activity: Not on file  Stress: Not on file  Social Connections: Not on file  Intimate Partner Violence: Not on file      PHYSICAL EXAM  There were no vitals filed for this visit.   There is no height or weight on file to calculate BMI.  Generalized: Well developed, in no acute distress  Cardiology: normal rate and rhythm, no murmur  noted Neurological examination  Mentation: Alert oriented to time, place, history taking. Follows all commands speech and language fluent Cranial nerve II-XII: Pupils were equal round reactive to light. Extraocular movements were full, visual field were full on confrontational test. Facial sensation and strength were normal. Uvula tongue midline. Head turning and shoulder shrug  were normal and symmetric. Motor: The motor testing reveals 5 over 5 strength of all 4 extremities. Good symmetric motor tone is noted throughout.  Coordination: Cerebellar testing reveals good finger-nose-finger and heel-to-shin bilaterally.  Gait and station: Gait is normal.   DIAGNOSTIC DATA (LABS, IMAGING, TESTING) - I reviewed patient records, labs, notes, testing and imaging myself where available.      No data to display           Lab Results  Component Value Date   WBC 8.2 03/24/2022   HGB 13.4 03/24/2022   HCT 41.0 03/24/2022   MCV 88.5 03/24/2022   PLT 292.0 03/24/2022      Component Value Date/Time   NA 139 03/30/2022 1338   NA 142 11/22/2021 1321   K 3.1 (L) 03/30/2022 1338   CL 98 03/30/2022 1338   CO2 31 03/30/2022 1338   GLUCOSE 90 03/30/2022 1338   BUN 17 03/30/2022 1338   BUN 22 11/22/2021 1321   CREATININE 1.31 (H) 03/30/2022 1338   CREATININE 0.97 09/17/2015 1113   CALCIUM 9.7 03/30/2022 1338   PROT 7.0 03/24/2022 1155   PROT 6.5 10/20/2021 1001   ALBUMIN 4.1 03/24/2022 1155   ALBUMIN 4.5 10/20/2021 1001   AST 16 03/24/2022 1155   ALT 14 03/24/2022 1155   ALKPHOS 80 03/24/2022 1155   BILITOT 0.6 03/24/2022 1155   BILITOT 0.5 10/20/2021 1001   GFRNONAA 38 (L) 09/03/2019 1335   GFRAA 44 (L) 09/03/2019 1335   Lab Results  Component Value Date   CHOL 141 03/24/2022   HDL 58.30 03/24/2022   LDLCALC 61 03/24/2022   TRIG 110.0 03/24/2022   CHOLHDL 2 03/24/2022   Lab Results  Component Value Date   HGBA1C 5.7 03/24/2022   Lab Results  Component Value Date    VITAMINB12 643 03/24/2022   Lab Results  Component Value Date   TSH 2.34 03/24/2022    ASSESSMENT AND  PLAN 66 y.o. year old female  has a past medical history of ALLERGIC RHINITIS (05/09/2008), Allergy, ANXIETY (05/23/2007), ARM PAIN, LEFT (10/23/2009), Arthritis, Asthma, ASTHMA (05/23/2007), Back pain, Cardiomyopathy ([place]) (09/08/2015), Cataract, Chest pain, CHF (congestive heart failure) ([place]), Chronic diastolic heart failure ([place]) (01/22/2020), Constipation, CONTACT DERMATITIS (06/09/2009), Diabetes mellitus without complication ([place]), Dyspnea, ENDOMETRIOSIS NOS (05/23/2007), FACIAL PAIN (06/02/2010), Food allergy, FREQUENCY, URINARY (05/17/2010), GERD (gastroesophageal reflux disease), GLUCOSE INTOLERANCE (08/28/2009), HERPES SIMPLEX, UNCOMPLICATED (56/86/1683), Hyperlipidemia (08/28/2015), HYPERTHYROIDISM (05/23/2007), Hypoactive thyroid, HYPOTHYROIDISM (05/09/2008), Impaired glucose tolerance (01/28/2011), INSOMNIA, HX OF (05/23/2007), Joint pain, LATERAL EPICONDYLITIS, RIGHT (03/10/2009), LIPOMA (10/22/2010), NECK PAIN (11/10/2010), OBESITY (05/23/2007), Osteopenia (09/28/2018), Plantar fasciitis, Pre-diabetes, SHOULDER PAIN, LEFT (05/17/2010), SINUSITIS- ACUTE-NOS (11/03/2008), SKIN LESION (11/10/2010), Sleep apnea, Stage III chronic kidney disease ([place]), Unspecified Chest Pain (07/25/2008), UNSPECIFIED URTICARIA (06/04/2009), URI (08/28/2009), UTI (04/29/2008), VITAMIN D DEFICIENCY (08/28/2009), and Wheezing (07/12/2010). here with   No diagnosis found.    Ms. Sans is doing very well with CPAP therapy.  She has noted significant improvement in her energy and less daytime sleepiness.  Compliance report reveals acceptable daily but sub optimal 4 hour compliance.  She was encouraged to continue using CPAP nightly and for greater than 4 hours each night. She will use memory compensation strategies. Grandmother and aunt had AD. May consider neuropsychology eval in future.  She will follow-up  annually, sooner if needed.  She verbalizes understanding and agreement with this plan.   No orders of the defined types were placed in this encounter.     No orders of the defined types were placed in this encounter.      Debbora Presto, FNP-C 05/18/2022, 5:23 PM [facility] [address], [place], [place] [address] [phone]

## 2022-05-18 NOTE — Patient Instructions (Signed)
Please continue using your CPAP regularly. While your insurance requires that you use CPAP at least 4 hours each night on 70% of the nights, I recommend, that you not skip any nights and use it throughout the night if you can. Getting used to CPAP and staying with the treatment long term does take time and patience and discipline. Untreated obstructive sleep apnea when it is moderate to severe can have an adverse impact on cardiovascular health and raise her risk for heart disease, arrhythmias, hypertension, congestive heart failure, stroke and diabetes. Untreated obstructive sleep apnea causes sleep disruption, nonrestorative sleep, and sleep deprivation. This can have an impact on your day to day functioning and cause daytime sleepiness and impairment of cognitive function, memory loss, mood disturbance, and problems focussing. Using CPAP regularly can improve these symptoms.   Management of Memory Problems   There are some general things you can do to help manage your memory problems.  Your memory may not in fact recover, but by using techniques and strategies you will be able to manage your memory difficulties better.   1)  Establish a routine. Try to establish and then stick to a regular routine.  By doing this, you will get used to what to expect and you will reduce the need to rely on your memory.  Also, try to do things at the same time of day, such as taking your medication or checking your calendar first thing in the morning. Think about think that you can do as a part of a regular routine and make a list.  Then enter them into a daily planner to remind you.  This will help you establish a routine.   2)  Organize your environment. Organize your environment so that it is uncluttered.  Decrease visual stimulation.  Place everyday items such as keys or cell phone in the same place every day (ie.  Basket next to front door) Use post it notes with a brief message to yourself (ie. Turn off light,  lock the door) Use labels to indicate where things go (ie. Which cupboards are for food, dishes, etc.) Keep a notepad and pen by the telephone to take messages   3)  Memory Aids A diary or journal/notebook/daily planner Making a list (shopping list, chore list, to do list that needs to be done) Using an alarm as a reminder (kitchen timer or cell phone alarm) Using cell phone to store information (Notes, Calendar, Reminders) Calendar/White board placed in a prominent position Post-it notes   In order for memory aids to be useful, you need to have good habits.  It's no good remembering to make a note in your journal if you don't remember to look in it.  Try setting aside a certain time of day to look in journal.   4)  Improving mood and managing fatigue. There may be other factors that contribute to memory difficulties.  Factors, such as anxiety, depression and tiredness can affect memory. Regular gentle exercise can help improve your mood and give you more energy. Simple relaxation techniques may help relieve symptoms of anxiety Try to get back to completing activities or hobbies you enjoyed doing in the past. Learn to pace yourself through activities to decrease fatigue. Find out about some local support groups where you can share experiences with others. Try and achieve 7-8 hours of sleep at night.

## 2022-05-19 ENCOUNTER — Ambulatory Visit: Payer: BC Managed Care – PPO | Admitting: Family Medicine

## 2022-05-19 ENCOUNTER — Encounter: Payer: Self-pay | Admitting: Family Medicine

## 2022-05-19 VITALS — BP 127/75 | HR 89 | Ht 64.0 in | Wt 237.0 lb

## 2022-05-19 DIAGNOSIS — G4733 Obstructive sleep apnea (adult) (pediatric): Secondary | ICD-10-CM | POA: Diagnosis not present

## 2022-05-19 DIAGNOSIS — Z9989 Dependence on other enabling machines and devices: Secondary | ICD-10-CM | POA: Diagnosis not present

## 2022-05-23 ENCOUNTER — Encounter (HOSPITAL_BASED_OUTPATIENT_CLINIC_OR_DEPARTMENT_OTHER): Payer: Self-pay | Admitting: *Deleted

## 2022-05-24 NOTE — Progress Notes (Signed)
CM sent for new order

## 2022-05-30 ENCOUNTER — Ambulatory Visit (HOSPITAL_BASED_OUTPATIENT_CLINIC_OR_DEPARTMENT_OTHER): Payer: BC Managed Care – PPO | Admitting: Cardiovascular Disease

## 2022-05-30 ENCOUNTER — Encounter (HOSPITAL_BASED_OUTPATIENT_CLINIC_OR_DEPARTMENT_OTHER): Payer: Self-pay | Admitting: Cardiovascular Disease

## 2022-05-30 VITALS — BP 108/75 | HR 95 | Ht 64.0 in | Wt 237.3 lb

## 2022-05-30 DIAGNOSIS — Z5181 Encounter for therapeutic drug level monitoring: Secondary | ICD-10-CM

## 2022-05-30 DIAGNOSIS — I480 Paroxysmal atrial fibrillation: Secondary | ICD-10-CM

## 2022-05-30 DIAGNOSIS — I10 Essential (primary) hypertension: Secondary | ICD-10-CM

## 2022-05-30 DIAGNOSIS — I5032 Chronic diastolic (congestive) heart failure: Secondary | ICD-10-CM | POA: Diagnosis not present

## 2022-05-30 NOTE — Assessment & Plan Note (Addendum)
BP is very well-controlled.  Continue losartan.  Encouraged her to increase her exercise.

## 2022-05-30 NOTE — Assessment & Plan Note (Signed)
She has struggled with fluctuations in volume status but is euvolemic today.  Continue torsemide and metolazone.  BP well-controlled.  Continue losartan.  Check BMP as she has struggled with hypokalemia.  It has been a while since her LVEF has been assessed.  Repeat echo.  Consider [place] or [place].

## 2022-05-30 NOTE — Patient Instructions (Signed)
Medication Instructions:  Your physician recommends that you continue on your current medications as directed. Please refer to the Current Medication list given to you today.   *If you need a refill on your cardiac medications before your next appointment, please call your pharmacy*  Lab Work: BMET SOON   If you have labs (blood work) drawn today and your tests are completely normal, you will receive your results only by: [place] (if you have MyChart) OR A paper copy in the mail If you have any lab test that is abnormal or we need to change your treatment, we will call you to review the results.  Testing/Procedures: Your physician has requested that you have an echocardiogram. Echocardiography is a painless test that uses sound waves to create images of your heart. It provides your doctor with information about the size and shape of your heart and how well your heart's chambers and valves are working. This procedure takes approximately one hour. There are no restrictions for this procedure.   Follow-Up: At [facility], you and your health needs are our priority.  As part of our continuing mission to provide you with exceptional heart care, we have created designated Provider Care Teams.  These Care Teams include your primary Cardiologist (physician) and Advanced Practice Providers (APPs -  Physician Assistants and Nurse Practitioners) who all work together to provide you with the care you need, when you need it.  We recommend signing up for the patient portal called "MyChart".  Sign up information is provided on this After Visit Summary.  MyChart is used to connect with patients for Virtual Visits (Telemedicine).  Patients are able to view lab/test results, encounter notes, upcoming appointments, etc.  Non-urgent messages can be sent to your provider as well.   To learn more about what you can do with MyChart, go to [email or web address].    Your next appointment:   6  month(s)  The format for your next appointment:   In Person  Provider:   Skeet Latch, MD

## 2022-05-30 NOTE — Progress Notes (Signed)
Cardiology Office Note   Date:  05/30/2022   ID:  Suzanne Hansen, Suzanne Hansen May 13, 1956, MRN [id number]  PCP:  Biagio Borg, MD  Cardiologist:   Skeet Latch, MD   No chief complaint on file.   History of Present Illness: Suzanne Hansen is a 66 y.o. 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  She was first seen in 2016 when she was diagnosed with acute systolic and diastolic heart failure.  Prior to that she had a stress Myoview 09/2013 that revealed LVEF 48% and no ischemia.  She was followed by her primary care physician and switch from hydrochlorothiazide to Lasix.  She had no symptoms at the time she first saw Dr. Angelena Form in 2016.  She was started on carvedilol and lisinopril. Lisinopril was subsequently converted to losartan due to cough. Her symptoms were felt to be nonischemic. She notes that she was under a lot of stress at the time.  She subsequently transferred her care to [facility] with Dr. Kirtland Bouchard.  He underwent CPX testing in 2018 and was found to have moderate cardiac limitation and deconditioning.  Echo 01/2017 revealed LVEF 50-55%.  Her most recent echo 05/2018 revealed LVEF 55 to 60% with grade 1 diastolic dysfunction.  She reportedly has a history of atrial fibrillation that occurred in the setting of her initial heart failure symptoms.  She has not had any recurrence and has never been on anticoagulation.  She was symptomatic in atrial fibrillation.  Suzanne Hansen had a spinal fusion in December 2020.  Her back pain has been better but still troubles her at times.  It makes it difficult for her to exercise.  After her surgery she had some issues with bladder incontinence and lowering the dose of her diuretic seems to help this as well.  She last saw Dr. Jenny Reichmann on 10/15/2020 and was started on Zetia.  She hasn't  picked it up yet.  She wasn't tolerating statins due to muscle pain.  She didn't tolerate it even every other day.  At her last appointment, she was doing well. Since her last appointment, she was seen by cardiology at [facility] 05/2021 for evaluation of LE edema. She also reported exertional dyspnea and was unsure if it was asthma or heart failure.She had an echo which revealed an LVEF 60-65%. Her atrial pressure was 63mHg. She established care with a new cardiologist 06/2021 and spironolactone was added. He thought her swelling was more related to lymphedema than heart failure. BNP was not elevated.  Today, she is feeling well. She has been becoming more active again, including extra walking in her daily routines when possible. She normally walks throughout her house and yard for exercise. She states that she does not take her blood pressure much at home due to the cuff being painful around her arm. Her weight fluctuates, with 3-5 lbs increases every now and then. She reports that her breathing has improved since her 30 lbs weight loss. Of note, she continues to have LE tenderness. She denies any palpitations, chest pain, shortness of breath, or peripheral edema. No lightheadedness, headaches, syncope, orthopnea, or PND.   Past Medical History:  Diagnosis Date   ALLERGIC RHINITIS 05/09/2008   Allergy    ANXIETY 05/23/2007   ARM PAIN, LEFT 10/23/2009   Arthritis    ASTHMA 05/23/2007   Back pain  Cardiomyopathy ([place]) 09/08/2015   Cataract    Chest pain    CHF (congestive heart failure) (HCC)    Chronic diastolic heart failure ([place]) 01/22/2020   Constipation    CONTACT DERMATITIS 06/09/2009   Diabetes mellitus without complication ([place])    type II   Dyspnea    allergies-dust, cig smoke, rag weeds   ENDOMETRIOSIS NOS 05/23/2007   FACIAL PAIN 06/02/2010   Food allergy    FREQUENCY, URINARY 05/17/2010   GERD (gastroesophageal reflux disease)    GLUCOSE INTOLERANCE 08/28/2009   HERPES SIMPLEX,  UNCOMPLICATED 62/95/2841   Hyperlipidemia 08/28/2015   HYPERTHYROIDISM 05/23/2007   Hypoactive thyroid    HYPOTHYROIDISM 05/09/2008   Impaired glucose tolerance 01/28/2011   INSOMNIA, HX OF 05/23/2007   Joint pain    LATERAL EPICONDYLITIS, RIGHT 03/10/2009   LIPOMA 10/22/2010   NECK PAIN 11/10/2010   OBESITY 05/23/2007   Osteopenia 09/28/2018   Plantar fasciitis    Both feet   Pre-diabetes    SHOULDER PAIN, LEFT 05/17/2010   SINUSITIS- ACUTE-NOS 11/03/2008   SKIN LESION 11/10/2010   Sleep apnea    Stage III chronic kidney disease ([place])    Stage III   Unspecified Chest Pain 07/25/2008   UNSPECIFIED URTICARIA 06/04/2009   URI 08/28/2009   UTI 04/29/2008   VITAMIN D DEFICIENCY 08/28/2009   Wheezing 07/12/2010    Past Surgical History:  Procedure Laterality Date   COLONOSCOPY     COLONOSCOPY WITH PROPOFOL N/A 04/25/2022   Procedure: COLONOSCOPY WITH PROPOFOL;  Surgeon: Mauri Pole, MD;  Location: [facility];  Service: Gastroenterology;  Laterality: N/A;   ENDOMETRIAL ABLATION     LAMINECTOMY  1996   LAPAROTOMY     exploratory   lower back surgery  07/06/2017   cyst   POLYPECTOMY     POLYPECTOMY  04/25/2022   Procedure: POLYPECTOMY;  Surgeon: Mauri Pole, MD;  Location: [facility];  Service: Gastroenterology;;   s/p endometrial ablation  12/06   TUBAL LIGATION     UTERINE FIBROID SURGERY     WISDOM TOOTH EXTRACTION      Current Outpatient Medications  Medication Sig Dispense Refill   albuterol (VENTOLIN HFA) 108 (90 Base) MCG/ACT inhaler INHALE 2 PUFFS BY MOUTH EVERY 4 HOURS AS NEEDED (Patient taking differently: 1-2 puffs every 4 (four) hours as needed for wheezing or shortness of breath.) 6.7 g 11   allopurinol (ZYLOPRIM) 100 MG tablet TAKE 2 TABLETS (200 MG TOTAL) BY MOUTH DAILY. 180 tablet 3   aspirin 81 MG EC tablet Take 81 mg by mouth daily.     ezetimibe (ZETIA) 10 MG tablet TAKE 1 TABLET BY MOUTH EVERY DAY 90 tablet 3   fexofenadine  (ALLEGRA) 180 MG tablet Take 180 mg by mouth daily.     gabapentin (NEURONTIN) 100 MG capsule Take 1 capsule (100 mg total) by mouth 2 (two) times daily as needed. 180 capsule 2   KLOR-CON M20 20 MEQ tablet TAKE 1 TABLET (20 MEQ TOTAL) BY MOUTH DAILY. OK TO TAKE AN EXTRA ON DAYS YOU TAKE EXTRA TORSEMIDE 180 tablet 2   levothyroxine (SYNTHROID) 137 MCG tablet TAKE 1 TABLET BY MOUTH DAILY BEFORE BREAKFAST. 90 tablet 3   losartan (COZAAR) 25 MG tablet Take 1/2 tablet (12.5 mg total) by mouth daily. 45 tablet 1   pantoprazole (PROTONIX) 40 MG tablet Take 1 tablet (40 mg total) by mouth daily. 90 tablet 3   Semaglutide,0.25 or 0.'5MG'$ /DOS, (OZEMPIC, 0.25 OR 0.5 MG/DOSE,) 2 MG/3ML SOPN  Inject 0.5 mg into the skin once a week. 12 mL 3   SYMBICORT 160-4.5 MCG/ACT inhaler Inhale 1-2 puffs into the lungs daily at 12 noon.     torsemide (DEMADEX) 20 MG tablet Take 60 mg by mouth daily. Takes '60mg'$  daily     No current facility-administered medications for this visit.    Allergies:   Shellfish allergy, Clarithromycin, Latex, Metronidazole, Zostavax [zoster vaccine live], Adhesive [tape], Amiloride, Clindamycin, Gadobenate, and Lisinopril    Social History:  The patient  reports that she quit smoking about 34 years ago. Her smoking use included cigarettes. She has a 6.00 pack-year smoking history. She has never used smokeless tobacco. She reports current alcohol use of about 1.0 standard drink of alcohol per week. She reports that she does not use drugs.   Family History:  The patient's family history includes COPD in her father; Cancer in her maternal grandmother; Depression in her mother; Diabetes in her cousin and sister; Heart disease in her cousin and mother; Hypertension in her cousin and father; Kidney disease in her sister.    ROS:  Please see the history of present illness. (+) LE tenderness All other systems are reviewed and negative.   PHYSICAL EXAM: VS:  BP 108/75 (BP Location: Left Arm,  Patient Position: Sitting, Cuff Size: Large)   Pulse 95   Ht '5\' 4"'$  (1.626 m)   Wt 237 lb 4.8 oz (107.6 kg)   SpO2 98%   BMI 40.73 kg/m  , BMI Body mass index is 40.73 kg/m. GENERAL:  Well appearing HEENT:  Pupils equal round and reactive, fundi not visualized, oral mucosa unremarkable NECK:  No jugular venous distention, waveform within normal limits, carotid upstroke brisk and symmetric, no bruits, no thyromegaly LUNGS:  Diffuse wheezing  HEART:  RRR.  PMI not displaced or sustained,S1 and S2 within normal limits, no S3, no S4, no clicks, no rubs, no murmurs ABD:  Flat, positive bowel sounds normal in frequency in pitch, no bruits, no rebound, no guarding, no midline pulsatile mass, no hepatomegaly, no splenomegaly EXT:  2 plus pulses throughout, 1+ pitting edema above the ankles, no cyanosis no clubbing SKIN:  No rashes no nodules NEURO:  Cranial nerves II through XII grossly intact, motor grossly intact throughout PSYCH:  Cognitively intact, oriented to person place and time  EKG: EKG is personally reviewed.  05/30/22: EKG was not ordered.   11/12/21: Sinus rhythm.  Rate 94 bpm.   09/15/21: Sinus rhythm, rate 90 bpm; LAD 04/21/20: Sinus rhythm.  Rate 79 bpm.  Prior inferior infarct. 01/22/20 sinus rhythm.  Rate 74 bpm.  Low voltage.    Other studies reviewed:   CT Coronary Morph 11/26/2021: FINDINGS: The visualized portions of the lower lung fields show no suspicious nodules, masses, or infiltrates. No pleural fluid seen.   The visualized portions of the mediastinum and chest wall are unremarkable.   IMPRESSION: No significant non-cardiovascular abnormality seen in visualized portion of the thorax.   Lower Venous DVT 01/14/21 RIGHT:  - No evidence of common femoral vein obstruction.  LEFT:  - No evidence of deep vein thrombosis in the lower extremity.  No indirect evidence of obstruction proximal to the inguinal ligament.  - No cystic structure found in the popliteal fossa.    LE Venous Study 05/31/19 Left: There is no evidence of acute deep vein thrombosis in the lower  extremity. No evidence of superficial thrombophlebitis.   Echo 09/08/15 - Left ventricle: The cavity size was normal. There was  mild focal    basal hypertrophy of the septum. Systolic function was mildly to    moderately reduced. The estimated ejection fraction was in the    range of 40% to 45%. Hypokinesis of the anteroseptal and    inferoseptal myocardium. Doppler parameters are consistent with    abnormal left ventricular relaxation (grade 1 diastolic dysfunction).  - Right ventricle: The cavity size was normal. Wall thickness was    normal. Systolic function was normal.  - Tricuspid valve: There was trivial regurgitation.  - Pulmonary arteries: PA peak pressure: 26 mm Hg (S).  - Dyssynchrony, with contraction of the anterior and lateral walls    prior to the septal and inferior walls.   Recent Labs: 06/30/2021: B Natriuretic Peptide 18.8 03/24/2022: ALT 14; Hemoglobin 13.4; Platelets 292.0; TSH 2.34 03/30/2022: BUN 17; Creatinine, Ser 1.31; Potassium 3.1; Sodium 139    Lipid Panel    Component Value Date/Time   CHOL 141 03/24/2022 1155   CHOL 195 10/20/2021 1001   TRIG 110.0 03/24/2022 1155   HDL 58.30 03/24/2022 1155   HDL 102 10/20/2021 1001   CHOLHDL 2 03/24/2022 1155   VLDL 22.0 03/24/2022 1155   LDLCALC 61 03/24/2022 1155   LDLCALC 82 10/20/2021 1001      Wt Readings from Last 3 Encounters:  05/30/22 237 lb 4.8 oz (107.6 kg)  05/19/22 237 lb (107.5 kg)  05/06/22 235 lb (106.6 kg)      ASSESSMENT AND PLAN:  Chronic heart failure with preserved ejection fraction (HCC) She has struggled with fluctuations in volume status but is euvolemic today.  Continue torsemide and metolazone.  BP well-controlled.  Continue losartan.  Check BMP as she has struggled with hypokalemia.  It has been a while since her LVEF has been assessed.  Repeat echo.  Consider [place] or  [place].  Essential (primary) hypertension BP is very well-controlled.  Continue losartan.  Encouraged her to increase her exercise.     Medication Adjustments/ Labs and Tests ordered:  Current medicines are reviewed at length with the patient today.  The patient does not have concerns regarding medicines.  The following changes have been made:  no change  Labs/ tests ordered today include:   Orders Placed This Encounter  Procedures   Basic metabolic panel   ECHOCARDIOGRAM COMPLETE    Disposition: FU with Rogan Wigley C. Oval Linsey, MD, [facility] in 6 months   I,Breanna Adamick,acting as a scribe for Skeet Latch, MD.,have documented all relevant documentation on the behalf of Skeet Latch, MD,as directed by  Skeet Latch, MD while in the presence of Skeet Latch, MD.   I, [place] Oval Linsey, MD have reviewed all documentation for this visit.  The documentation of the exam, diagnosis, procedures, and orders on 05/30/2022 are all accurate and complete.  Signed, Kleo Dungee C. Oval Linsey, MD, [facility]  05/30/2022 5:37 PM    [place]

## 2022-05-31 ENCOUNTER — Telehealth (HOSPITAL_BASED_OUTPATIENT_CLINIC_OR_DEPARTMENT_OTHER): Payer: Self-pay | Admitting: Cardiovascular Disease

## 2022-05-31 DIAGNOSIS — M5412 Radiculopathy, cervical region: Secondary | ICD-10-CM | POA: Diagnosis not present

## 2022-05-31 NOTE — Telephone Encounter (Signed)
Left message for patient to call and schedule the Echocardiogram and 6 month follow up visit ordered by Dr. Oval Linsey

## 2022-06-01 NOTE — Progress Notes (Signed)
Suzanne Hansen, Suzanne Hansen  Biagio Snelson, CMA  Patient has a collections balance greater than $500.00.  I will upload documents

## 2022-06-06 ENCOUNTER — Encounter: Payer: Self-pay | Admitting: Physician Assistant

## 2022-06-06 ENCOUNTER — Other Ambulatory Visit (HOSPITAL_COMMUNITY): Payer: Self-pay

## 2022-06-06 ENCOUNTER — Ambulatory Visit: Payer: BC Managed Care – PPO | Admitting: Physician Assistant

## 2022-06-06 VITALS — BP 110/68 | HR 76 | Ht 64.0 in | Wt 238.4 lb

## 2022-06-06 DIAGNOSIS — K219 Gastro-esophageal reflux disease without esophagitis: Secondary | ICD-10-CM | POA: Diagnosis not present

## 2022-06-06 MED ORDER — PANTOPRAZOLE SODIUM 40 MG PO TBEC
40.0000 mg | DELAYED_RELEASE_TABLET | Freq: Every day | ORAL | 3 refills | Status: DC
  Filled 2022-06-06 – 2022-07-27 (×2): qty 90, 90d supply, fill #0
  Filled 2022-11-11: qty 90, 90d supply, fill #1
  Filled 2023-05-20: qty 90, 90d supply, fill #2

## 2022-06-06 NOTE — Patient Instructions (Signed)
If you are age 65 or older, your body mass index should be between 23-30. Your Body mass index is 40.92 kg/m. If this is out of the aforementioned range listed, please consider follow up with your Primary Care Provider. ________________________________________________________  The [place] [facility] providers would like to encourage you to use [facility] to communicate with providers for non-urgent requests or questions.  Due to long hold times on the telephone, sending your provider a message by [facility] may be a faster and more efficient way to get a response.  Please allow 48 business hours for a response.  Please remember that this is for non-urgent requests.  _______________________________________________________  Refills of Pantoprazole have been sent to your pharmacy.  Try to follow Anti-reflux precautions/diet  Follow up in 1 year or sooner if needed.  Thank you for entrusting me with your care and choosing [facility].  Amy Esterwood, PA-C

## 2022-06-06 NOTE — Progress Notes (Signed)
Subjective:    Patient ID: Suzanne Hansen, female    DOB: 1956-02-26, 66 y.o.   MRN: [id number]  HPI Suzanne Hansen is a 66 year old female, established with Dr. Silverio Decamp who comes in today for follow-up of GERD. Patient had undergone colonoscopy with Dr. Roland Rack on 04/25/2022 with finding of 4 small sessile polyps measuring 3 to 5 mm, which were removed, there were a few diverticuli in the sigmoid and descending colon and nonbleeding internal and external hemorrhoids.  Biopsies revealed tubular adenomas..  She will be indicated for 5-year interval follow-up. She relates prior history of GERD and has been on medication off and on for years.  She says she had been on omeprazole at 1 point, then her insurance stopped paying for this and she was using Tums which were not helpful.  When she was seen for the colonoscopy, she had mentioned issues with GERD and a prescription for Protonix 40 mg p.o. every morning was called in. She says as long as she is on medication for the acid reflux she does not have any symptoms and that her symptoms are under good control currently.  She denies any nocturnal reflux type symptoms or sour brash, no dysphagia or dyne aphasia. Other medical problems include atrial fibrillation, congestive heart failure, sleep apnea with CPAP use, obesity and hypertension.  Review of Systems Pertinent positive and negative review of systems were noted in the above HPI section.  All other review of systems was otherwise negative.   Outpatient Encounter Medications as of 06/06/2022  Medication Sig   albuterol (VENTOLIN HFA) 108 (90 Base) MCG/ACT inhaler INHALE 2 PUFFS BY MOUTH EVERY 4 HOURS AS NEEDED (Patient taking differently: 1-2 puffs every 4 (four) hours as needed for wheezing or shortness of breath.)   allopurinol (ZYLOPRIM) 100 MG tablet TAKE 2 TABLETS (200 MG TOTAL) BY MOUTH DAILY.   aspirin 81 MG EC tablet Take 81 mg by mouth daily.   ezetimibe (ZETIA) 10 MG tablet TAKE 1 TABLET BY  MOUTH EVERY DAY   fexofenadine (ALLEGRA) 180 MG tablet Take 180 mg by mouth daily.   gabapentin (NEURONTIN) 100 MG capsule Take 1 capsule (100 mg total) by mouth 2 (two) times daily as needed.   KLOR-CON M20 20 MEQ tablet TAKE 1 TABLET (20 MEQ TOTAL) BY MOUTH DAILY. OK TO TAKE AN EXTRA ON DAYS YOU TAKE EXTRA TORSEMIDE   levothyroxine (SYNTHROID) 137 MCG tablet TAKE 1 TABLET BY MOUTH DAILY BEFORE BREAKFAST.   losartan (COZAAR) 25 MG tablet Take 1/2 tablet (12.5 mg total) by mouth daily.   Semaglutide,0.25 or 0.'5MG'$ /DOS, (OZEMPIC, 0.25 OR 0.5 MG/DOSE,) 2 MG/3ML SOPN Inject 0.5 mg into the skin once a week.   SYMBICORT 160-4.5 MCG/ACT inhaler Inhale 1-2 puffs into the lungs daily at 12 noon.   torsemide (DEMADEX) 20 MG tablet Take 60 mg by mouth daily. Takes '60mg'$  daily   [DISCONTINUED] pantoprazole (PROTONIX) 40 MG tablet Take 1 tablet (40 mg total) by mouth daily.   pantoprazole (PROTONIX) 40 MG tablet Take 1 tablet (40 mg total) by mouth daily.   No facility-administered encounter medications on file as of 06/06/2022.   Allergies  Allergen Reactions   Shellfish Allergy Anaphylaxis   Clarithromycin Nausea And Vomiting   Latex Itching and Other (See Comments)    ITCHING AND COLD SORES AROUND MOUTH WHEN LATEX GLOVES USED BY THE DENTIST/HYGENTIST   Metronidazole Nausea And Vomiting   Zostavax [Zoster Vaccine Live]     Arm swelled   Adhesive [Tape] Dermatitis  Plastic tape    Amiloride     foot numbness   Clindamycin Nausea Only and Nausea And Vomiting   Gadobenate     Pt has had some form of dye without issues but isnt sure if she is allergic to this specific agent    Lisinopril Cough   Patient Active Problem List   Diagnosis Date Noted   Polyp of cecum    Polyp of ascending colon    Closed fracture of shaft of fibula 02/02/2022   Left facial pain 84/13/2440   Acute diastolic (congestive) heart failure ([place]) 01/25/2022   Trochanteric bursitis of right hip 10/23/2021   Acute  dysfunction of right eustachian tube 08/20/2021   Unilateral primary osteoarthritis, left knee 02/03/2021   Statin myopathy 10/15/2020   Gout 10/15/2020   Abnormal cervical Papanicolaou smear 07/30/2020   Memory difficulties 05/13/2020   Chronic back pain 12/18/2019   Yeast vaginitis 10/12/2019   Overactive bladder 10/02/2019   Spondylosis without myelopathy or radiculopathy, lumbar region 07/18/2019   Essential (primary) hypertension 07/18/2019   Effusion of left knee 05/31/2019   Pain and swelling of left lower leg 05/31/2019   Chronic low back pain 04/02/2019   Left otitis media 12/17/2018   At risk for diabetes mellitus 11/08/2018   Pain in thoracic spine 11/08/2018   Paresthesia of upper limb 11/08/2018   Arthritis 10/29/2018   Cervical radiculopathy 10/23/2018   Osteopenia 09/28/2018   Elevated blood uric acid level 09/28/2018   Polyarthralgia 09/28/2018   Paroxysmal atrial fibrillation ([place]) 09/18/2018   OSA on CPAP 08/13/2018   Stage 3 chronic kidney disease ([place]) 06/26/2018   Rash 06/26/2018   Wheezing 06/26/2018   Left hip pain 05/27/2018   Bicipital tendinitis of right shoulder 05/16/2018   Bursitis of left hip 05/16/2018   Chronic heart failure with preserved ejection fraction ([place]) 04/25/2018   GERD (gastroesophageal reflux disease) 09/27/2017   Acute shoulder bursitis, left 08/28/2017   Lumbosacral radiculopathy at L4 06/14/2017   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 ([place]) 04/20/2017   Easy bruising 03/29/2017   Left leg paresthesias 03/29/2017   Muscle cramping 03/20/2017   Patellofemoral arthritis 02/07/2017   Pain in lower jaw 11/10/2016   Cough 11/06/2016   Vaginitis 09/17/2016   Left wrist pain 09/16/2016   Hypercoagulable state ([place]) 06/09/2016   Iron deficiency 06/09/2016   Synovial cyst of lumbar spine 06/09/2016   Hypersomnia with sleep apnea 02/15/2016   Extrinsic asthma 02/15/2016   Ankle edema 02/15/2016    Degenerative arthritis of knee, bilateral 01/21/2016   Asthma with exacerbation 10/20/2015   Acute sinus infection 09/29/2015   Bilateral calf pain 08/31/2015   Hyperlipidemia 08/28/2015   Left knee pain 08/28/2015   Varicose veins with pain 08/28/2015   Peripheral edema 08/13/2015   Injection site reaction 06/02/2015   PHN (postherpetic neuralgia) 05/29/2015   Shingles 06/10/2014   Chest pain of uncertain etiology 08/06/2535   Skin nodule 11/27/2012   Encounter for well adult exam with abnormal findings 01/28/2011   Prediabetes 01/28/2011   Lipoma 10/22/2010   Vitamin D deficiency 08/28/2009   Hypothyroidism 05/09/2008   Allergic rhinitis 05/09/2008   HERPES ZOSTER W/NERVOUS COMPLICATION NEC 64/40/3474   Hyperthyroidism 05/23/2007   OBESITY 05/23/2007   Anxiety state 05/23/2007   Asthma 05/23/2007   Morbid obesity with BMI of 40.0-44.9, adult ([place]) 05/23/2007   INSOMNIA, HX OF 05/23/2007   Social History   Socioeconomic History   Marital status: Single  Spouse name: Not on file   Number of children: 3   Years of education: Not on file   Highest education level: Not on file  Occupational History   Occupation: SOCIAL WORKER    Employer: [employer]   Occupation: Water engineer for preschool infants  Tobacco Use   Smoking status: Former    Packs/day: 0.50    Years: 12.00    Total pack years: 6.00    Types: Cigarettes    Quit date: 11/05/1987    Years since quitting: 34.6   Smokeless tobacco: Never  Vaping Use   Vaping Use: Never used  Substance and Sexual Activity   Alcohol use: Yes    Alcohol/week: 1.0 standard drink of alcohol    Types: 1 Glasses of wine per week    Comment: WINE   Drug use: No   Sexual activity: Not on file  Other Topics Concern   Not on file  Social History Narrative   Not on file   Social Determinants of Health   Financial Resource Strain: Not on file  Food Insecurity: Not on file   Transportation Needs: Not on file  Physical Activity: Not on file  Stress: Not on file  Social Connections: Not on file  Intimate Partner Violence: Not on file    Suzanne Hansen's family history includes COPD in her father; Cancer in her maternal grandmother; Depression in her mother; Diabetes in her cousin and sister; Heart disease in her cousin and mother; Hypertension in her cousin and father; Kidney disease in her sister.      Objective:    Vitals:   06/06/22 0922  BP: 110/68  Pulse: 76    Physical Exam Well-developed well-nourished female in no acute distress.  Height, Weight, 238 BMI 40.9  HEENT; nontraumatic normocephalic, EOMI, PE R LA, sclera anicteric.  Extremities; no clubbing cyanosis or edema skin warm and dry Neuro/Psych; alert and oriented x4, grossly nonfocal mood and affect appropriate        Assessment & Plan:   #9 66 year old female with history of chronic GERD-, had been out of medication for a period of time with increase in symptoms, currently doing well on Protonix 40 mg p.o. every morning. #2 history of adenomatous colon polyps, recent colonoscopy 04/25/2022 with removal of 4 small sessile polyps measuring 3 to 5 mm. Path consistent with tubular adenomatous Indicated for 5-year interval follow-up  #3 diverticulosis #4 small internal and external hemorrhoids Atrial fibrillation Congestive heart failure Hypertension Sleep apnea with CPAP use Obesity  Plan; refill Protonix 40 mg p.o. every morning x1 year Antireflux diet and antireflux regimen Patient will follow-up with Dr. Silverio Decamp or myself as needed.   Suzanne Hansen Genia Harold PA-C 06/06/2022   Cc: Biagio Borg, MD

## 2022-06-07 ENCOUNTER — Other Ambulatory Visit (HOSPITAL_COMMUNITY): Payer: Self-pay

## 2022-06-07 ENCOUNTER — Telehealth: Payer: Self-pay | Admitting: Internal Medicine

## 2022-06-07 DIAGNOSIS — M25571 Pain in right ankle and joints of right foot: Secondary | ICD-10-CM | POA: Insufficient documentation

## 2022-06-07 MED ORDER — WEGOVY 1 MG/0.5ML ~~LOC~~ SOAJ
1.0000 mg | SUBCUTANEOUS | 11 refills | Status: DC
Start: 2022-06-07 — End: 2022-06-08
  Filled 2022-06-07: qty 2, 28d supply, fill #0

## 2022-06-07 NOTE — Telephone Encounter (Signed)
Ok this was done to [place]

## 2022-06-07 NOTE — Telephone Encounter (Signed)
Patient would like to go up to the 1.0 Ozempic - please send to [facility] - Patient would like to have it today if at all possible

## 2022-06-08 ENCOUNTER — Other Ambulatory Visit (HOSPITAL_COMMUNITY): Payer: Self-pay

## 2022-06-08 MED ORDER — OZEMPIC (1 MG/DOSE) 4 MG/3ML ~~LOC~~ SOPN
1.0000 mg | PEN_INJECTOR | SUBCUTANEOUS | 3 refills | Status: DC
  Filled 2022-06-08: qty 3, 28d supply, fill #0
  Filled 2022-07-02: qty 3, 28d supply, fill #1
  Filled 2022-07-27: qty 3, 28d supply, fill #2
  Filled 2022-08-30: qty 3, 28d supply, fill #3
  Filled 2022-09-22: qty 3, 28d supply, fill #4

## 2022-06-08 NOTE — Telephone Encounter (Signed)
Patient states that her insurance does not pay for wygovy - she needs the ozempic 1.0

## 2022-06-08 NOTE — Telephone Encounter (Signed)
Both ozempic and wegovy were on her med list  Ok for ozempic 1 mg - done erx

## 2022-06-08 NOTE — Telephone Encounter (Signed)
Spoke with patient and she states that she is on ozempic and you told her to let you know if she wanted to increase to 1.0. Please advise as she states that she is a prediabetic.

## 2022-06-08 NOTE — Telephone Encounter (Signed)
See below

## 2022-06-08 NOTE — Telephone Encounter (Signed)
Patient notified

## 2022-06-08 NOTE — Telephone Encounter (Signed)
Sorry no since ozempic is for diabetes, which she does not have.

## 2022-06-16 ENCOUNTER — Encounter: Payer: Self-pay | Admitting: Gastroenterology

## 2022-06-17 ENCOUNTER — Ambulatory Visit
Admission: RE | Admit: 2022-06-17 | Discharge: 2022-06-17 | Disposition: A | Discharge status: Home / Self Care | Payer: BC Managed Care – PPO | Source: Ambulatory Visit | Attending: Pulmonary Disease | Admitting: Pulmonary Disease

## 2022-06-17 DIAGNOSIS — J45909 Unspecified asthma, uncomplicated: Secondary | ICD-10-CM

## 2022-06-17 DIAGNOSIS — R0602 Shortness of breath: Secondary | ICD-10-CM | POA: Diagnosis not present

## 2022-06-22 ENCOUNTER — Other Ambulatory Visit (HOSPITAL_COMMUNITY): Payer: Self-pay

## 2022-06-22 ENCOUNTER — Other Ambulatory Visit: Payer: Self-pay | Admitting: Internal Medicine

## 2022-06-22 MED FILL — Ezetimibe Tab 10 MG: ORAL | 90 days supply | Qty: 90 | Fill #0 | Status: AC

## 2022-06-23 ENCOUNTER — Other Ambulatory Visit (HOSPITAL_COMMUNITY): Payer: Self-pay

## 2022-06-23 ENCOUNTER — Other Ambulatory Visit (HOSPITAL_COMMUNITY): Payer: BC Managed Care – PPO

## 2022-06-23 MED FILL — Potassium Chloride Microencapsulated Crys ER Tab 20 mEq: ORAL | 90 days supply | Qty: 180 | Fill #0 | Status: AC

## 2022-06-24 ENCOUNTER — Other Ambulatory Visit (HOSPITAL_COMMUNITY): Payer: Self-pay

## 2022-06-28 ENCOUNTER — Ambulatory Visit (INDEPENDENT_AMBULATORY_CARE_PROVIDER_SITE_OTHER): Payer: BC Managed Care – PPO

## 2022-06-28 ENCOUNTER — Other Ambulatory Visit (HOSPITAL_BASED_OUTPATIENT_CLINIC_OR_DEPARTMENT_OTHER): Payer: BC Managed Care – PPO

## 2022-06-28 DIAGNOSIS — Z5181 Encounter for therapeutic drug level monitoring: Secondary | ICD-10-CM | POA: Diagnosis not present

## 2022-06-28 DIAGNOSIS — I480 Paroxysmal atrial fibrillation: Secondary | ICD-10-CM | POA: Diagnosis not present

## 2022-06-28 DIAGNOSIS — I10 Essential (primary) hypertension: Secondary | ICD-10-CM | POA: Diagnosis not present

## 2022-06-29 ENCOUNTER — Other Ambulatory Visit (HOSPITAL_COMMUNITY): Payer: Self-pay

## 2022-06-29 ENCOUNTER — Encounter: Payer: Self-pay | Admitting: Pulmonary Disease

## 2022-06-29 ENCOUNTER — Ambulatory Visit (INDEPENDENT_AMBULATORY_CARE_PROVIDER_SITE_OTHER): Payer: BC Managed Care – PPO | Admitting: Pulmonary Disease

## 2022-06-29 ENCOUNTER — Ambulatory Visit: Payer: BC Managed Care – PPO | Admitting: Pulmonary Disease

## 2022-06-29 VITALS — BP 118/74 | HR 60 | Temp 97.8°F | Ht 64.0 in | Wt 240.4 lb

## 2022-06-29 DIAGNOSIS — J4540 Moderate persistent asthma, uncomplicated: Secondary | ICD-10-CM

## 2022-06-29 DIAGNOSIS — R9389 Abnormal findings on diagnostic imaging of other specified body structures: Secondary | ICD-10-CM | POA: Diagnosis not present

## 2022-06-29 DIAGNOSIS — Z9989 Dependence on other enabling machines and devices: Secondary | ICD-10-CM | POA: Diagnosis not present

## 2022-06-29 DIAGNOSIS — G4733 Obstructive sleep apnea (adult) (pediatric): Secondary | ICD-10-CM

## 2022-06-29 LAB — BASIC METABOLIC PANEL
BUN/Creatinine Ratio: 14 (ref 12–28)
BUN: 20 mg/dL (ref 8–27)
CO2: 28 mmol/L (ref 20–29)
Calcium: 9.9 mg/dL (ref 8.7–10.3)
Chloride: 97 mmol/L (ref 96–106)
Creatinine, Ser: 1.48 mg/dL — ABNORMAL HIGH (ref 0.57–1.00)
Glucose: 83 mg/dL (ref 70–99)
Potassium: 3.7 mmol/L (ref 3.5–5.2)
Sodium: 142 mmol/L (ref 134–144)
eGFR: 39 mL/min/{1.73_m2} — ABNORMAL LOW (ref >59)

## 2022-06-29 MED ORDER — SYMBICORT 160-4.5 MCG/ACT IN AERO
2.0000 | INHALATION_SPRAY | Freq: Two times a day (BID) | RESPIRATORY_TRACT | 11 refills | Status: DC
  Filled 2022-06-29: qty 10.2, 30d supply, fill #0
  Filled 2022-07-27: qty 10.2, 30d supply, fill #1
  Filled 2022-08-30: qty 10.2, 30d supply, fill #2
  Filled 2022-09-30: qty 10.2, 30d supply, fill #3
  Filled 2022-11-11: qty 10.2, 30d supply, fill #4
  Filled 2023-01-10 (×2): qty 10.2, 30d supply, fill #5
  Filled 2023-02-25: qty 10.2, 30d supply, fill #6
  Filled 2023-04-18: qty 10.2, 30d supply, fill #7
  Filled 2023-05-20: qty 10.2, 30d supply, fill #8

## 2022-06-29 NOTE — Progress Notes (Signed)
Suzanne Hansen    [id number]    October 26, 1955  Primary Care Physician:John, Hunt Oris, MD  Referring Physician: Biagio Borg, MD [address] [place],  [place] [address]  Chief complaint: Follow-up for abnormal chest x-ray  HPI: 66 year old with history of asthma, OSA, heart failure, atrial fibrillation She had an episode of pneumonia in June 2022 and another episode in December 2022 requiring multiple rounds of prednisone and antibiotics.  Chest x-ray in December showed a left lower lobe opacity concerning for atelectasis and has been referred here for further evaluation  Currently she states that her symptoms are better.  She has occasional cough with wheezing Continues on Symbicort for asthma She is due to get CT coronary next week for evaluation of cardiomyopathy with EF 45%  She has history of sleep apnea and follows with Dr. Brett Fairy at [facility] neurology  Pets: No pets Occupation: Water engineer for early childhood Exposures: No mold, hot tub, Jacuzzi.  No feather pillows or comforters Smoking history: 6-pack-year smoker.  Quit in 1988 Travel history: No significant travel history Relevant family history: No family history of lung disease  Interim history:  Breathing is stable on Symbicort No recent exacerbations She is here for review of CT scan  Outpatient Encounter Medications as of 06/29/2022  Medication Sig   albuterol (VENTOLIN HFA) 108 (90 Base) MCG/ACT inhaler INHALE 2 PUFFS BY MOUTH EVERY 4 HOURS AS NEEDED (Patient taking differently: 1-2 puffs every 4 (four) hours as needed for wheezing or shortness of breath.)   allopurinol (ZYLOPRIM) 100 MG tablet TAKE 2 TABLETS (200 MG TOTAL) BY MOUTH DAILY.   aspirin 81 MG EC tablet Take 81 mg by mouth daily.   ezetimibe (ZETIA) 10 MG tablet Take 1 tablet (10 mg total) by mouth daily.   fexofenadine (ALLEGRA) 180 MG tablet Take 180 mg by mouth daily.   gabapentin (NEURONTIN) 100 MG capsule Take 1 capsule (100  mg total) by mouth 2 (two) times daily as needed.   levothyroxine (SYNTHROID) 137 MCG tablet TAKE 1 TABLET BY MOUTH DAILY BEFORE BREAKFAST.   losartan (COZAAR) 25 MG tablet Take 1/2 tablet (12.5 mg total) by mouth daily.   pantoprazole (PROTONIX) 40 MG tablet Take 1 tablet (40 mg total) by mouth daily.   potassium chloride SA (KLOR-CON M) 20 MEQ tablet Take 1 tablet (20 mEq total) by mouth daily. OK to take extra on days you take extra torsemide.   Semaglutide, 1 MG/DOSE, (OZEMPIC, 1 MG/DOSE,) 4 MG/3ML SOPN Inject 1 mg into the skin once a week.   SYMBICORT 160-4.5 MCG/ACT inhaler Inhale 2 puffs into the lungs in the morning and at bedtime.   torsemide (DEMADEX) 20 MG tablet Take 60 mg by mouth daily. Takes '60mg'$  daily   No facility-administered encounter medications on file as of 06/29/2022.    Physical Exam: Blood pressure 118/74, pulse 60, temperature 97.8 F (36.6 C), temperature source Oral, height '5\' 4"'$  (1.626 m), weight 240 lb 6.4 oz (109 kg), SpO2 96 %. Gen:      No acute distress HEENT:  EOMI, sclera anicteric Neck:     No masses; no thyromegaly Lungs:    Clear to auscultation bilaterally; normal respiratory effort CV:         Regular rate and rhythm; no murmurs Abd:      + bowel sounds; soft, non-tender; no palpable masses, no distension Ext:    No edema; adequate peripheral perfusion Skin:  Warm and dry; no rash Neuro: alert and oriented x 3 Psych: normal mood and affect   Data Reviewed: Imaging: Chest x-ray 10/01/2021-atelectasis, scarring at the left lateral lung base which is stable compared to previous chest x-ray on 09/17/2021.   Cardiac CT 11/26/2021-personally visualized by me.  Atelectatic change in the anterior aspect of the left lower lobe  CT chest 06/17/2022-bandlike scarring in the right middle lobe and lingula. I have reviewed the images personally.  PFTs: 12/27/2021 FVC 2.05 [84%], FEV1 1.46 [76%], F/F 71, TLC 4.89 [99%], DLCO 14.10 [73%] Minimal diffusion  defect.  Bronchodilator response with air trapping.  Labs: CBC 10/20/2021- WBC 10, eos 1%, absolute eosinophil count 100  Assessment:  Asthma Symptoms well controlled on Symbicort.  PFTs reviewed with bronchodilator response and air trapping consistent with her diagnosis of asthma  Abnormal chest x-ray She had treatment for pneumonia in December 2022 with follow-up chest x-ray showing persistent left lower lobe opacity/atelectasis  We reviewed with minimal linear scarring which postinflammatory.  The findings are not concerning.  There is no evidence of interstitial lung disease  OSA Continue CPAP.  Managed by [facility] neurology  Plan/Recommendations: Continue Symbicort  Follow-up in 1 year.  Marshell Garfinkel MD  Pulmonary and Critical Care 06/29/2022, 1:44 PM  CC: Biagio Borg, MD

## 2022-06-29 NOTE — Patient Instructions (Signed)
I am glad your breathing is stable Continue the inhalers CT scan shows very minimal scarring which may be from prior infection.  It does not look concerning Follow-up in 1 year.

## 2022-06-30 DIAGNOSIS — M170 Bilateral primary osteoarthritis of knee: Secondary | ICD-10-CM | POA: Diagnosis not present

## 2022-07-01 DIAGNOSIS — M5412 Radiculopathy, cervical region: Secondary | ICD-10-CM | POA: Diagnosis not present

## 2022-07-01 LAB — ECHOCARDIOGRAM COMPLETE
Area-P 1/2: 3.77 cm2
S' Lateral: 3.46 cm

## 2022-07-02 ENCOUNTER — Other Ambulatory Visit (HOSPITAL_COMMUNITY): Payer: Self-pay

## 2022-07-04 ENCOUNTER — Other Ambulatory Visit: Payer: BC Managed Care – PPO

## 2022-07-04 ENCOUNTER — Other Ambulatory Visit (HOSPITAL_COMMUNITY): Payer: Self-pay

## 2022-07-07 ENCOUNTER — Ambulatory Visit: Payer: BC Managed Care – PPO | Admitting: Pulmonary Disease

## 2022-07-27 ENCOUNTER — Other Ambulatory Visit (HOSPITAL_COMMUNITY): Payer: Self-pay

## 2022-07-27 MED FILL — Allopurinol Tab 100 MG: ORAL | 90 days supply | Qty: 180 | Fill #1 | Status: AC

## 2022-07-27 MED FILL — Levothyroxine Sodium Tab 137 MCG: ORAL | 90 days supply | Qty: 90 | Fill #1 | Status: CN

## 2022-07-28 ENCOUNTER — Ambulatory Visit: Payer: BC Managed Care – PPO

## 2022-07-30 ENCOUNTER — Other Ambulatory Visit (HOSPITAL_COMMUNITY): Payer: Self-pay

## 2022-07-31 DIAGNOSIS — M5412 Radiculopathy, cervical region: Secondary | ICD-10-CM | POA: Diagnosis not present

## 2022-08-01 ENCOUNTER — Encounter: Payer: Self-pay | Admitting: Internal Medicine

## 2022-08-01 ENCOUNTER — Other Ambulatory Visit (HOSPITAL_COMMUNITY): Payer: Self-pay

## 2022-08-01 ENCOUNTER — Telehealth (HOSPITAL_BASED_OUTPATIENT_CLINIC_OR_DEPARTMENT_OTHER): Payer: Self-pay

## 2022-08-01 ENCOUNTER — Ambulatory Visit: Payer: BC Managed Care – PPO | Admitting: Internal Medicine

## 2022-08-01 VITALS — BP 120/70 | HR 78 | Temp 98.0°F | Ht 64.0 in | Wt 230.0 lb

## 2022-08-01 DIAGNOSIS — Z23 Encounter for immunization: Secondary | ICD-10-CM

## 2022-08-01 DIAGNOSIS — E559 Vitamin D deficiency, unspecified: Secondary | ICD-10-CM | POA: Diagnosis not present

## 2022-08-01 DIAGNOSIS — E7800 Pure hypercholesterolemia, unspecified: Secondary | ICD-10-CM

## 2022-08-01 DIAGNOSIS — R7303 Prediabetes: Secondary | ICD-10-CM

## 2022-08-01 DIAGNOSIS — E538 Deficiency of other specified B group vitamins: Secondary | ICD-10-CM

## 2022-08-01 DIAGNOSIS — N1831 Chronic kidney disease, stage 3a: Secondary | ICD-10-CM

## 2022-08-01 LAB — LIPID PANEL
Cholesterol: 168 mg/dL (ref 0–200)
HDL: 78.2 mg/dL (ref >39.00)
LDL Cholesterol: 76 mg/dL (ref 0–99)
NonHDL: 89.31
Total CHOL/HDL Ratio: 2
Triglycerides: 68 mg/dL (ref 0.0–149.0)
VLDL: 13.6 mg/dL (ref 0.0–40.0)

## 2022-08-01 LAB — BASIC METABOLIC PANEL
BUN: 18 mg/dL (ref 6–23)
CO2: 31 mEq/L (ref 19–32)
Calcium: 9.8 mg/dL (ref 8.4–10.5)
Chloride: 103 mEq/L (ref 96–112)
Creatinine, Ser: 1.35 mg/dL — ABNORMAL HIGH (ref 0.40–1.20)
GFR: 41.3 mL/min — ABNORMAL LOW (ref >60.00)
Glucose, Bld: 63 mg/dL — ABNORMAL LOW (ref 70–99)
Potassium: 3.5 mEq/L (ref 3.5–5.1)
Sodium: 143 mEq/L (ref 135–145)

## 2022-08-01 LAB — HEPATIC FUNCTION PANEL
ALT: 15 U/L (ref 0–35)
AST: 18 U/L (ref 0–37)
Albumin: 4.4 g/dL (ref 3.5–5.2)
Alkaline Phosphatase: 79 U/L (ref 39–117)
Bilirubin, Direct: 0.1 mg/dL (ref 0.0–0.3)
Total Bilirubin: 0.5 mg/dL (ref 0.2–1.2)
Total Protein: 7.1 g/dL (ref 6.0–8.3)

## 2022-08-01 LAB — VITAMIN D 25 HYDROXY (VIT D DEFICIENCY, FRACTURES): VITD: 38.08 ng/mL (ref 30.00–100.00)

## 2022-08-01 LAB — HEMOGLOBIN A1C: Hgb A1c MFr Bld: 5.4 % (ref 4.6–6.5)

## 2022-08-01 MED ORDER — TORSEMIDE 20 MG PO TABS
60.0000 mg | ORAL_TABLET | Freq: Every day | ORAL | 1 refills | Status: DC
  Filled 2022-08-01: qty 270, 90d supply, fill #0
  Filled 2022-11-11: qty 270, 90d supply, fill #1

## 2022-08-01 MED ORDER — DAPAGLIFLOZIN PROPANEDIOL 5 MG PO TABS
5.0000 mg | ORAL_TABLET | Freq: Every day | ORAL | 3 refills | Status: DC
  Filled 2022-08-01: qty 90, 90d supply, fill #0
  Filled 2022-11-11: qty 90, 90d supply, fill #1
  Filled 2023-02-25: qty 84, 84d supply, fill #2
  Filled 2023-02-25 (×2): qty 6, 6d supply, fill #2
  Filled 2023-05-26 – 2023-05-31 (×4): qty 90, 90d supply, fill #3

## 2022-08-01 NOTE — Assessment & Plan Note (Signed)
Last vitamin D Lab Results  Component Value Date   VD25OH 38.08 08/01/2022   Low, reminded to start oral replacement

## 2022-08-01 NOTE — Progress Notes (Signed)
Patient ID: Suzanne Hansen, female   DOB: [birth year], 66 y.o.   MRN: [id number]        Chief Complaint: follow up HTN, HLD and hyperglycemia preDM, obesity, ckd, low potassium        HPI:  Suzanne Hansen is a 66 y.o. female here with c/o RSV exposure with grandson in [place] over the weekend, parents neg on testing.  Denies fever, cough and Pt denies chest pain, increased sob or doe, wheezing, orthopnea, PND, increased LE swelling, palpitations, dizziness or syncope.   Pt denies polydipsia, polyuria, or new focal neuro s/s.    Pt denies fever, night sweats, loss of appetite, or other constitutional symptoms  Has lower wt with ozempic and feels much improved, and hoping this will continue.  Did have mild low K last visit, resolved on f/u sept 2023 lab.  CKD has been stable, and pt asking about renal referral, last GFR 39 - sept 19.  Willing to start farxiga in light of ckd.  Due for flu shot       Wt Readings from Last 3 Encounters:  08/01/22 230 lb (104.3 kg)  06/29/22 240 lb 6.4 oz (109 kg)  06/06/22 238 lb 6.4 oz (108.1 kg)   BP Readings from Last 3 Encounters:  08/01/22 120/70  06/29/22 118/74  06/06/22 110/68         Past Medical History:  Diagnosis Date   ALLERGIC RHINITIS 05/09/2008   Allergy    ANXIETY 05/23/2007   ARM PAIN, LEFT 10/23/2009   Arthritis    ASTHMA 05/23/2007   Back pain    Cardiomyopathy ([place]) 09/08/2015   Cataract    Chest pain    CHF (congestive heart failure) (HCC)    Chronic diastolic heart failure ([place]) 01/22/2020   Constipation    CONTACT DERMATITIS 06/09/2009   Diabetes mellitus without complication ([place])    type II   Dyspnea    allergies-dust, cig smoke, rag weeds   ENDOMETRIOSIS NOS 05/23/2007   FACIAL PAIN 06/02/2010   Food allergy    FREQUENCY, URINARY 05/17/2010   GERD (gastroesophageal reflux disease)    GLUCOSE INTOLERANCE 08/28/2009   HERPES SIMPLEX, UNCOMPLICATED 01/77/9390   Hyperlipidemia 08/28/2015   HYPERTHYROIDISM 05/23/2007    Hypoactive thyroid    HYPOTHYROIDISM 05/09/2008   Impaired glucose tolerance 01/28/2011   INSOMNIA, HX OF 05/23/2007   Joint pain    LATERAL EPICONDYLITIS, RIGHT 03/10/2009   LIPOMA 10/22/2010   NECK PAIN 11/10/2010   OBESITY 05/23/2007   Osteopenia 09/28/2018   Plantar fasciitis    Both feet   Pre-diabetes    SHOULDER PAIN, LEFT 05/17/2010   SINUSITIS- ACUTE-NOS 11/03/2008   SKIN LESION 11/10/2010   Sleep apnea    Stage III chronic kidney disease ([place])    Stage III   Unspecified Chest Pain 07/25/2008   UNSPECIFIED URTICARIA 06/04/2009   URI 08/28/2009   UTI 04/29/2008   VITAMIN D DEFICIENCY 08/28/2009   Wheezing 07/12/2010   Past Surgical History:  Procedure Laterality Date   COLONOSCOPY     COLONOSCOPY WITH PROPOFOL N/A 04/25/2022   Procedure: COLONOSCOPY WITH PROPOFOL;  Surgeon: Mauri Pole, MD;  Location: [facility];  Service: Gastroenterology;  Laterality: N/A;   ENDOMETRIAL ABLATION     LAMINECTOMY  1996   LAPAROTOMY     exploratory   lower back surgery  07/06/2017   cyst   POLYPECTOMY     POLYPECTOMY  04/25/2022   Procedure: POLYPECTOMY;  Surgeon: Mauri Pole, MD;  Location: [facility];  Service: Gastroenterology;;   s/p endometrial ablation  12/06   TUBAL LIGATION     UTERINE FIBROID SURGERY     WISDOM TOOTH EXTRACTION      reports that she quit smoking about 34 years ago. Her smoking use included cigarettes. She has a 6.00 pack-year smoking history. She has never used smokeless tobacco. She reports current alcohol use of about 1.0 standard drink of alcohol per week. She reports that she does not use drugs. family history includes COPD in her father; Cancer in her maternal grandmother; Depression in her mother; Diabetes in her cousin and sister; Heart disease in her cousin and mother; Hypertension in her cousin and father; Kidney disease in her sister. Allergies  Allergen Reactions   Shellfish Allergy Anaphylaxis   Clarithromycin Nausea And  Vomiting   Latex Itching and Other (See Comments)    ITCHING AND COLD SORES AROUND MOUTH WHEN LATEX GLOVES USED BY THE DENTIST/HYGENTIST   Metronidazole Nausea And Vomiting   Zostavax [Zoster Vaccine Live]     Arm swelled   Adhesive [Tape] Dermatitis    Plastic tape    Amiloride     foot numbness   Clindamycin Nausea Only and Nausea And Vomiting   Gadobenate     Pt has had some form of dye without issues but isnt sure if she is allergic to this specific agent    Lisinopril Cough   Current Outpatient Medications on File Prior to Visit  Medication Sig Dispense Refill   albuterol (VENTOLIN HFA) 108 (90 Base) MCG/ACT inhaler INHALE 2 PUFFS BY MOUTH EVERY 4 HOURS AS NEEDED (Patient taking differently: 1-2 puffs every 4 (four) hours as needed for wheezing or shortness of breath.) 6.7 g 11   allopurinol (ZYLOPRIM) 100 MG tablet TAKE 2 TABLETS (200 MG TOTAL) BY MOUTH DAILY. 180 tablet 3   aspirin 81 MG EC tablet Take 81 mg by mouth daily.     ezetimibe (ZETIA) 10 MG tablet Take 1 tablet (10 mg total) by mouth daily. 90 tablet 3   fexofenadine (ALLEGRA) 180 MG tablet Take 180 mg by mouth daily.     gabapentin (NEURONTIN) 100 MG capsule Take 1 capsule (100 mg total) by mouth 2 (two) times daily as needed. 180 capsule 2   levothyroxine (SYNTHROID) 137 MCG tablet TAKE 1 TABLET BY MOUTH DAILY BEFORE BREAKFAST. 90 tablet 3   losartan (COZAAR) 25 MG tablet Take 1/2 tablet (12.5 mg total) by mouth daily. 45 tablet 1   pantoprazole (PROTONIX) 40 MG tablet Take 1 tablet (40 mg total) by mouth daily. 90 tablet 3   potassium chloride SA (KLOR-CON M) 20 MEQ tablet Take 1 tablet (20 mEq total) by mouth daily. OK to take extra on days you take extra torsemide. 180 tablet 2   Semaglutide, 1 MG/DOSE, (OZEMPIC, 1 MG/DOSE,) 4 MG/3ML SOPN Inject 1 mg into the skin once a week. 9 mL 3   SYMBICORT 160-4.5 MCG/ACT inhaler Inhale 2 puffs into the lungs in the morning and at bedtime. 10.2 g 11   torsemide (DEMADEX) 20  MG tablet Take 60 mg by mouth daily. Takes '60mg'$  daily     No current facility-administered medications on file prior to visit.        ROS:  All others reviewed and negative.  Objective        PE:  BP 120/70 (BP Location: Left Arm, Patient Position: Sitting, Cuff Size: Large)   Pulse 78   Temp 98 F (36.7  C) (Oral)   Ht '5\' 4"'$  (1.626 m)   Wt 230 lb (104.3 kg)   SpO2 99%   BMI 39.48 kg/m                 Constitutional: Pt appears in NAD               HENT: Head: NCAT.                Right Ear: External ear normal.                 Left Ear: External ear normal.                Eyes: . Pupils are equal, round, and reactive to light. Conjunctivae and EOM are normal               Nose: without d/c or deformity               Neck: Neck supple. Gross normal ROM               Cardiovascular: Normal rate and regular rhythm.                 Pulmonary/Chest: Effort normal and breath sounds without rales or wheezing.                Abd:  Soft, NT, ND, + BS, no organomegaly               Neurological: Pt is alert. At baseline orientation, motor grossly intact               Skin: Skin is warm. No rashes, no other new lesions, LE edema - none               Psychiatric: Pt behavior is normal without agitation   Micro: none  Cardiac tracings I have personally interpreted today:  none  Pertinent Radiological findings (summarize): none   Lab Results  Component Value Date   WBC 8.2 03/24/2022   HGB 13.4 03/24/2022   HCT 41.0 03/24/2022   PLT 292.0 03/24/2022   GLUCOSE 63 (L) 08/01/2022   CHOL 168 08/01/2022   TRIG 68.0 08/01/2022   HDL 78.20 08/01/2022   LDLCALC 76 08/01/2022   ALT 15 08/01/2022   AST 18 08/01/2022   NA 143 08/01/2022   K 3.5 08/01/2022   CL 103 08/01/2022   CREATININE 1.35 (H) 08/01/2022   BUN 18 08/01/2022   CO2 31 08/01/2022   TSH 2.34 03/24/2022   INR 1.0 03/21/2017   HGBA1C 5.4 08/01/2022   MICROALBUR <0.7 10/02/2019   Assessment/Plan:  Cabria Danford is  a 66 y.o. Black or African American [2] female with  has a past medical history of ALLERGIC RHINITIS (05/09/2008), Allergy, ANXIETY (05/23/2007), ARM PAIN, LEFT (10/23/2009), Arthritis, ASTHMA (05/23/2007), Back pain, Cardiomyopathy ([place]) (09/08/2015), Cataract, Chest pain, CHF (congestive heart failure) ([place]), Chronic diastolic heart failure ([place]) (01/22/2020), Constipation, CONTACT DERMATITIS (06/09/2009), Diabetes mellitus without complication ([place]), Dyspnea, ENDOMETRIOSIS NOS (05/23/2007), FACIAL PAIN (06/02/2010), Food allergy, FREQUENCY, URINARY (05/17/2010), GERD (gastroesophageal reflux disease), GLUCOSE INTOLERANCE (08/28/2009), HERPES SIMPLEX, UNCOMPLICATED (22/29/7989), Hyperlipidemia (08/28/2015), HYPERTHYROIDISM (05/23/2007), Hypoactive thyroid, HYPOTHYROIDISM (05/09/2008), Impaired glucose tolerance (01/28/2011), INSOMNIA, HX OF (05/23/2007), Joint pain, LATERAL EPICONDYLITIS, RIGHT (03/10/2009), LIPOMA (10/22/2010), NECK PAIN (11/10/2010), OBESITY (05/23/2007), Osteopenia (09/28/2018), Plantar fasciitis, Pre-diabetes, SHOULDER PAIN, LEFT (05/17/2010), SINUSITIS- ACUTE-NOS (11/03/2008), SKIN LESION (11/10/2010), Sleep apnea, Stage III chronic kidney disease ([place]), Unspecified Chest Pain (07/25/2008), UNSPECIFIED URTICARIA (06/04/2009), URI (  08/28/2009), UTI (04/29/2008), VITAMIN D DEFICIENCY (08/28/2009), and Wheezing (07/12/2010).  Vitamin D deficiency Last vitamin D Lab Results  Component Value Date   VD25OH 38.08 08/01/2022   Low, reminded to start oral replacement   Stage 3 chronic kidney disease ([place]) Lab Results  Component Value Date   CREATININE 1.35 (H) 08/01/2022   Stable overall, cont to avoid nephrotoxins, and refer renal per pt reqeust, also start farxiga 5 mg qd  Prediabetes Lab Results  Component Value Date   HGBA1C 5.4 08/01/2022   Stable with recent wt loss, pt to continue current medical treatment ozempic 1 mg weekly   Hyperlipidemia Lab Results  Component  Value Date   LDLCALC 76 08/01/2022   Uncontrolled, goal ldl < 70, pt to continue current statin zetia 10 mg as declines statin  Followup: Return in about 3 months (around 11/01/2022).  Cathlean Cower, MD 08/01/2022 1:27 PM [facility]

## 2022-08-01 NOTE — Assessment & Plan Note (Signed)
Lab Results  Component Value Date   HGBA1C 5.4 08/01/2022   Stable with recent wt loss, pt to continue current medical treatment ozempic 1 mg weekly

## 2022-08-01 NOTE — Patient Instructions (Addendum)
You had the flu shot today  Please take all new medication as prescribed -the farxiga 5 mg per day  Please continue all other medications as before, and refills have been done if requested.  Please have the pharmacy call with any other refills you may need.  Please continue your efforts at being more active, low cholesterol diet, and weight control.  You are otherwise up to date with prevention measures today.  Please keep your appointments with your specialists as you may have planned  You will be contacted regarding the referral for: nephrology  Please go to the LAB at the blood drawing area for the tests to be done  You will be contacted by phone if any changes need to be made immediately.  Otherwise, you will receive a letter about your results with an explanation, but please check with MyChart first.  Please remember to sign up for MyChart if you have not done so, as this will be important to you in the future with finding out test results, communicating by private email, and scheduling acute appointments online when needed.  Please make an Appointment to return in Jan 2024, or sooner if needed, also with Lab Appointment for testing done 3-5 days before at the [facility] (so this is for TWO appointments - please see the scheduling desk as you leave)

## 2022-08-01 NOTE — Telephone Encounter (Signed)
Received fax from [facility] requesting refills for Torsemide 20 mg. Rx request sent to pharmacy.

## 2022-08-01 NOTE — Assessment & Plan Note (Addendum)
Lab Results  Component Value Date   LDLCALC 76 08/01/2022   Uncontrolled, goal ldl < 70, pt to continue current statin zetia 10 mg as declines statin

## 2022-08-01 NOTE — Assessment & Plan Note (Addendum)
Lab Results  Component Value Date   CREATININE 1.35 (H) 08/01/2022   Stable overall, cont to avoid nephrotoxins, and refer renal per pt reqeust, also start farxiga 5 mg qd

## 2022-08-03 ENCOUNTER — Other Ambulatory Visit (HOSPITAL_COMMUNITY): Payer: Self-pay

## 2022-08-05 ENCOUNTER — Other Ambulatory Visit (HOSPITAL_COMMUNITY): Payer: Self-pay

## 2022-08-09 ENCOUNTER — Other Ambulatory Visit (HOSPITAL_COMMUNITY): Payer: Self-pay

## 2022-08-15 ENCOUNTER — Other Ambulatory Visit (HOSPITAL_COMMUNITY): Payer: Self-pay

## 2022-08-16 ENCOUNTER — Other Ambulatory Visit (HOSPITAL_COMMUNITY): Payer: Self-pay

## 2022-08-16 DIAGNOSIS — N760 Acute vaginitis: Secondary | ICD-10-CM | POA: Diagnosis not present

## 2022-08-16 DIAGNOSIS — R102 Pelvic and perineal pain: Secondary | ICD-10-CM | POA: Diagnosis not present

## 2022-08-17 DIAGNOSIS — H25813 Combined forms of age-related cataract, bilateral: Secondary | ICD-10-CM | POA: Diagnosis not present

## 2022-08-17 DIAGNOSIS — H35372 Puckering of macula, left eye: Secondary | ICD-10-CM | POA: Diagnosis not present

## 2022-08-17 DIAGNOSIS — E119 Type 2 diabetes mellitus without complications: Secondary | ICD-10-CM | POA: Diagnosis not present

## 2022-08-17 DIAGNOSIS — H2512 Age-related nuclear cataract, left eye: Secondary | ICD-10-CM | POA: Diagnosis not present

## 2022-08-17 DIAGNOSIS — H52223 Regular astigmatism, bilateral: Secondary | ICD-10-CM | POA: Diagnosis not present

## 2022-08-19 ENCOUNTER — Other Ambulatory Visit (HOSPITAL_COMMUNITY): Payer: Self-pay

## 2022-08-22 DIAGNOSIS — H25813 Combined forms of age-related cataract, bilateral: Secondary | ICD-10-CM | POA: Diagnosis not present

## 2022-08-23 ENCOUNTER — Encounter: Payer: Self-pay | Admitting: Internal Medicine

## 2022-08-23 ENCOUNTER — Other Ambulatory Visit (HOSPITAL_COMMUNITY): Payer: Self-pay

## 2022-08-23 ENCOUNTER — Ambulatory Visit (INDEPENDENT_AMBULATORY_CARE_PROVIDER_SITE_OTHER): Payer: BC Managed Care – PPO | Admitting: Internal Medicine

## 2022-08-23 VITALS — BP 120/72 | HR 73 | Temp 98.4°F | Ht 64.0 in | Wt 230.0 lb

## 2022-08-23 DIAGNOSIS — N1831 Chronic kidney disease, stage 3a: Secondary | ICD-10-CM

## 2022-08-23 DIAGNOSIS — J45901 Unspecified asthma with (acute) exacerbation: Secondary | ICD-10-CM | POA: Diagnosis not present

## 2022-08-23 DIAGNOSIS — R7303 Prediabetes: Secondary | ICD-10-CM | POA: Diagnosis not present

## 2022-08-23 DIAGNOSIS — J309 Allergic rhinitis, unspecified: Secondary | ICD-10-CM | POA: Diagnosis not present

## 2022-08-23 MED ORDER — PREDNISONE 10 MG PO TABS
ORAL_TABLET | ORAL | 0 refills | Status: DC
  Filled 2022-08-23: qty 18, 9d supply, fill #0

## 2022-08-23 MED ORDER — METHYLPREDNISOLONE ACETATE 80 MG/ML IJ SUSP
80.0000 mg | Freq: Once | INTRAMUSCULAR | Status: AC
  Administered 2022-08-23: 80 mg via INTRAMUSCULAR

## 2022-08-23 NOTE — Assessment & Plan Note (Signed)
Lab Results  Component Value Date   CREATININE 1.35 (H) 08/01/2022   Stable overall, cont to avoid nephrotoxins

## 2022-08-23 NOTE — Assessment & Plan Note (Signed)
Mild to mod flare, for prednisone taper asd,  to f/u any worsening symptoms or concerns

## 2022-08-23 NOTE — Patient Instructions (Signed)
You had the steroid shot today  Please take all new medication as prescribed - the prednisone (and let the eye doctor know about this)  Please continue all other medications as before, and refills have been done if requested.  Please have the pharmacy call with any other refills you may need.  Please keep your appointments with your specialists as you may have planned

## 2022-08-23 NOTE — Assessment & Plan Note (Signed)
Lab Results  Component Value Date   HGBA1C 5.4 08/01/2022   Stable, pt to continue current medical treatment - diet, wt control, excercise

## 2022-08-23 NOTE — Progress Notes (Signed)
Patient ID: Suzanne Hansen, female   DOB: 1956/02/21, 66 y.o.   MRN: [id number]        Chief Complaint: follow up asthma exacerbation, allergies, ckd, preDM       HPI:  Suzanne Hansen is a 66 y.o. female here with c/o 3 days onset marked nasal allergy congetion and wheezing onest after visited a [place] with the family.  Pt denies chest pain, orthopnea, PND, increased LE swelling, palpitations, dizziness or syncope.   Pt denies polydipsia, polyuria, or new focal neuro s/s.    Pt denies fever, wt loss, night sweats, loss of appetite, or other constitutional symptoms         Wt Readings from Last 3 Encounters:  08/23/22 230 lb (104.3 kg)  08/01/22 230 lb (104.3 kg)  06/29/22 240 lb 6.4 oz (109 kg)   BP Readings from Last 3 Encounters:  08/23/22 120/72  08/01/22 120/70  06/29/22 118/74         Past Medical History:  Diagnosis Date   ALLERGIC RHINITIS 05/09/2008   Allergy    ANXIETY 05/23/2007   ARM PAIN, LEFT 10/23/2009   Arthritis    ASTHMA 05/23/2007   Back pain    Cardiomyopathy ([place]) 09/08/2015   Cataract    Chest pain    CHF (congestive heart failure) (HCC)    Chronic diastolic heart failure ([place]) 01/22/2020   Constipation    CONTACT DERMATITIS 06/09/2009   Diabetes mellitus without complication ([place])    type II   Dyspnea    allergies-dust, cig smoke, rag weeds   ENDOMETRIOSIS NOS 05/23/2007   FACIAL PAIN 06/02/2010   Food allergy    FREQUENCY, URINARY 05/17/2010   GERD (gastroesophageal reflux disease)    GLUCOSE INTOLERANCE 08/28/2009   HERPES SIMPLEX, UNCOMPLICATED 53/66/4403   Hyperlipidemia 08/28/2015   HYPERTHYROIDISM 05/23/2007   Hypoactive thyroid    HYPOTHYROIDISM 05/09/2008   Impaired glucose tolerance 01/28/2011   INSOMNIA, HX OF 05/23/2007   Joint pain    LATERAL EPICONDYLITIS, RIGHT 03/10/2009   LIPOMA 10/22/2010   NECK PAIN 11/10/2010   OBESITY 05/23/2007   Osteopenia 09/28/2018   Plantar fasciitis    Both feet   Pre-diabetes    SHOULDER  PAIN, LEFT 05/17/2010   SINUSITIS- ACUTE-NOS 11/03/2008   SKIN LESION 11/10/2010   Sleep apnea    Stage III chronic kidney disease ([place])    Stage III   Unspecified Chest Pain 07/25/2008   UNSPECIFIED URTICARIA 06/04/2009   URI 08/28/2009   UTI 04/29/2008   VITAMIN D DEFICIENCY 08/28/2009   Wheezing 07/12/2010   Past Surgical History:  Procedure Laterality Date   COLONOSCOPY     COLONOSCOPY WITH PROPOFOL N/A 04/25/2022   Procedure: COLONOSCOPY WITH PROPOFOL;  Surgeon: Mauri Pole, MD;  Location: [facility];  Service: Gastroenterology;  Laterality: N/A;   ENDOMETRIAL ABLATION     LAMINECTOMY  1996   LAPAROTOMY     exploratory   lower back surgery  07/06/2017   cyst   POLYPECTOMY     POLYPECTOMY  04/25/2022   Procedure: POLYPECTOMY;  Surgeon: Mauri Pole, MD;  Location: [facility];  Service: Gastroenterology;;   s/p endometrial ablation  12/06   TUBAL LIGATION     UTERINE FIBROID SURGERY     WISDOM TOOTH EXTRACTION      reports that she quit smoking about 34 years ago. Her smoking use included cigarettes. She has a 6.00 pack-year smoking history. She has never used smokeless tobacco. She reports current alcohol  use of about 1.0 standard drink of alcohol per week. She reports that she does not use drugs. family history includes COPD in her father; Cancer in her maternal grandmother; Depression in her mother; Diabetes in her cousin and sister; Heart disease in her cousin and mother; Hypertension in her cousin and father; Kidney disease in her sister. Allergies  Allergen Reactions   Shellfish Allergy Anaphylaxis   Clarithromycin Nausea And Vomiting   Latex Itching and Other (See Comments)    ITCHING AND COLD SORES AROUND MOUTH WHEN LATEX GLOVES USED BY THE DENTIST/HYGENTIST   Metronidazole Nausea And Vomiting   Zostavax [Zoster Vaccine Live]     Arm swelled   Adhesive [Tape] Dermatitis    Plastic tape    Amiloride     foot numbness   Clindamycin Nausea Only  and Nausea And Vomiting   Gadobenate     Pt has had some form of dye without issues but isnt sure if she is allergic to this specific agent    Lisinopril Cough   Current Outpatient Medications on File Prior to Visit  Medication Sig Dispense Refill   albuterol (VENTOLIN HFA) 108 (90 Base) MCG/ACT inhaler INHALE 2 PUFFS BY MOUTH EVERY 4 HOURS AS NEEDED (Patient taking differently: 1-2 puffs every 4 (four) hours as needed for wheezing or shortness of breath.) 6.7 g 11   allopurinol (ZYLOPRIM) 100 MG tablet TAKE 2 TABLETS (200 MG TOTAL) BY MOUTH DAILY. 180 tablet 3   aspirin 81 MG EC tablet Take 81 mg by mouth daily.     dapagliflozin propanediol (FARXIGA) 5 MG TABS tablet Take 1 tablet (5 mg total) by mouth daily before breakfast. 90 tablet 3   ezetimibe (ZETIA) 10 MG tablet Take 1 tablet (10 mg total) by mouth daily. 90 tablet 3   fexofenadine (ALLEGRA) 180 MG tablet Take 180 mg by mouth daily.     gabapentin (NEURONTIN) 100 MG capsule Take 1 capsule (100 mg total) by mouth 2 (two) times daily as needed. 180 capsule 2   levothyroxine (SYNTHROID) 137 MCG tablet TAKE 1 TABLET BY MOUTH DAILY BEFORE BREAKFAST. 90 tablet 3   losartan (COZAAR) 25 MG tablet Take 1/2 tablet (12.5 mg total) by mouth daily. 45 tablet 1   pantoprazole (PROTONIX) 40 MG tablet Take 1 tablet (40 mg total) by mouth daily. 90 tablet 3   potassium chloride SA (KLOR-CON M) 20 MEQ tablet Take 1 tablet (20 mEq total) by mouth daily. OK to take extra on days you take extra torsemide. 180 tablet 2   Semaglutide, 1 MG/DOSE, (OZEMPIC, 1 MG/DOSE,) 4 MG/3ML SOPN Inject 1 mg into the skin once a week. 9 mL 3   SYMBICORT 160-4.5 MCG/ACT inhaler Inhale 2 puffs into the lungs in the morning and at bedtime. 10.2 g 11   torsemide (DEMADEX) 20 MG tablet Take 3 tablets (60 mg total) by mouth daily. 270 tablet 1   No current facility-administered medications on file prior to visit.        ROS:  All others reviewed and negative.  Objective         PE:  BP 120/72 (BP Location: Left Arm, Patient Position: Sitting, Cuff Size: Large)   Pulse 73   Temp 98.4 F (36.9 C) (Oral)   Ht '5\' 4"'$  (1.626 m)   Wt 230 lb (104.3 kg)   SpO2 97%   BMI 39.48 kg/m                 Constitutional: Pt  appears in NAD               HENT: Head: NCAT.                Right Ear: External ear normal.                 Left Ear: External ear normal. Bilat tm's with mild erythema.  Max sinus areas non tender.  Pharynx with mild erythema, no exudat               Eyes: . Pupils are equal, round, and reactive to light. Conjunctivae and EOM are normal               Nose: without d/c or deformity               Neck: Neck supple. Gross normal ROM               Cardiovascular: Normal rate and regular rhythm.                 Pulmonary/Chest: Effort normal and breath sounds decreased without rales but with few bilateral mild wheezing.                              Neurological: Pt is alert. At baseline orientation, motor grossly intact               Skin: Skin is warm. No rashes, no other new lesions, LE edema - none               Psychiatric: Pt behavior is normal without agitation   Micro: none  Cardiac tracings I have personally interpreted today:  none  Pertinent Radiological findings (summarize): none   Lab Results  Component Value Date   WBC 8.2 03/24/2022   HGB 13.4 03/24/2022   HCT 41.0 03/24/2022   PLT 292.0 03/24/2022   GLUCOSE 63 (L) 08/01/2022   CHOL 168 08/01/2022   TRIG 68.0 08/01/2022   HDL 78.20 08/01/2022   LDLCALC 76 08/01/2022   ALT 15 08/01/2022   AST 18 08/01/2022   NA 143 08/01/2022   K 3.5 08/01/2022   CL 103 08/01/2022   CREATININE 1.35 (H) 08/01/2022   BUN 18 08/01/2022   CO2 31 08/01/2022   TSH 2.34 03/24/2022   INR 1.0 03/21/2017   HGBA1C 5.4 08/01/2022   MICROALBUR <0.7 10/02/2019   Assessment/Plan:  Alexzia Stange is a 66 y.o. Black or African American [2] female with  has a past medical history of ALLERGIC RHINITIS  (05/09/2008), Allergy, ANXIETY (05/23/2007), ARM PAIN, LEFT (10/23/2009), Arthritis, ASTHMA (05/23/2007), Back pain, Cardiomyopathy ([place]) (09/08/2015), Cataract, Chest pain, CHF (congestive heart failure) ([place]), Chronic diastolic heart failure ([place]) (01/22/2020), Constipation, CONTACT DERMATITIS (06/09/2009), Diabetes mellitus without complication ([place]), Dyspnea, ENDOMETRIOSIS NOS (05/23/2007), FACIAL PAIN (06/02/2010), Food allergy, FREQUENCY, URINARY (05/17/2010), GERD (gastroesophageal reflux disease), GLUCOSE INTOLERANCE (08/28/2009), HERPES SIMPLEX, UNCOMPLICATED (54/62/7035), Hyperlipidemia (08/28/2015), HYPERTHYROIDISM (05/23/2007), Hypoactive thyroid, HYPOTHYROIDISM (05/09/2008), Impaired glucose tolerance (01/28/2011), INSOMNIA, HX OF (05/23/2007), Joint pain, LATERAL EPICONDYLITIS, RIGHT (03/10/2009), LIPOMA (10/22/2010), NECK PAIN (11/10/2010), OBESITY (05/23/2007), Osteopenia (09/28/2018), Plantar fasciitis, Pre-diabetes, SHOULDER PAIN, LEFT (05/17/2010), SINUSITIS- ACUTE-NOS (11/03/2008), SKIN LESION (11/10/2010), Sleep apnea, Stage III chronic kidney disease ([place]), Unspecified Chest Pain (07/25/2008), UNSPECIFIED URTICARIA (06/04/2009), URI (08/28/2009), UTI (04/29/2008), VITAMIN D DEFICIENCY (08/28/2009), and Wheezing (07/12/2010).  Allergic rhinitis Mild to mod flare, for prednisone taper asd,  to f/u any worsening symptoms or concerns  Asthma with exacerbation Mild to mod, for Depomedrol IM 80 mg, cont albuterol HFA prn,   to f/u any worsening symptoms or concerns   Prediabetes Lab Results  Component Value Date   HGBA1C 5.4 08/01/2022   Stable, pt to continue current medical treatment - diet, wt control, excercise   Stage 3 chronic kidney disease ([place]) Lab Results  Component Value Date   CREATININE 1.35 (H) 08/01/2022   Stable overall, cont to avoid nephrotoxins  Followup: Return if symptoms worsen or fail to improve.  Cathlean Cower, MD 08/23/2022 1:29 PM [facility]

## 2022-08-23 NOTE — Assessment & Plan Note (Signed)
Mild to mod, for Depomedrol IM 80 mg, cont albuterol HFA prn,   to f/u any worsening symptoms or concerns

## 2022-08-29 DIAGNOSIS — M5412 Radiculopathy, cervical region: Secondary | ICD-10-CM | POA: Diagnosis not present

## 2022-08-30 ENCOUNTER — Other Ambulatory Visit (HOSPITAL_COMMUNITY): Payer: Self-pay

## 2022-08-30 DIAGNOSIS — J45909 Unspecified asthma, uncomplicated: Secondary | ICD-10-CM | POA: Diagnosis not present

## 2022-08-30 DIAGNOSIS — Z6838 Body mass index (BMI) 38.0-38.9, adult: Secondary | ICD-10-CM | POA: Diagnosis not present

## 2022-08-30 DIAGNOSIS — Z7982 Long term (current) use of aspirin: Secondary | ICD-10-CM | POA: Diagnosis not present

## 2022-08-30 DIAGNOSIS — Z7951 Long term (current) use of inhaled steroids: Secondary | ICD-10-CM | POA: Diagnosis not present

## 2022-08-30 DIAGNOSIS — Z79890 Hormone replacement therapy (postmenopausal): Secondary | ICD-10-CM | POA: Diagnosis not present

## 2022-08-30 DIAGNOSIS — G4733 Obstructive sleep apnea (adult) (pediatric): Secondary | ICD-10-CM | POA: Diagnosis not present

## 2022-08-30 DIAGNOSIS — H25811 Combined forms of age-related cataract, right eye: Secondary | ICD-10-CM | POA: Diagnosis not present

## 2022-08-30 DIAGNOSIS — E039 Hypothyroidism, unspecified: Secondary | ICD-10-CM | POA: Diagnosis not present

## 2022-08-30 DIAGNOSIS — I110 Hypertensive heart disease with heart failure: Secondary | ICD-10-CM | POA: Diagnosis not present

## 2022-08-30 DIAGNOSIS — E669 Obesity, unspecified: Secondary | ICD-10-CM | POA: Diagnosis not present

## 2022-08-30 DIAGNOSIS — Z881 Allergy status to other antibiotic agents status: Secondary | ICD-10-CM | POA: Diagnosis not present

## 2022-08-30 DIAGNOSIS — E1136 Type 2 diabetes mellitus with diabetic cataract: Secondary | ICD-10-CM | POA: Diagnosis not present

## 2022-08-30 DIAGNOSIS — I509 Heart failure, unspecified: Secondary | ICD-10-CM | POA: Diagnosis not present

## 2022-08-30 DIAGNOSIS — Z888 Allergy status to other drugs, medicaments and biological substances status: Secondary | ICD-10-CM | POA: Diagnosis not present

## 2022-08-30 DIAGNOSIS — Z79899 Other long term (current) drug therapy: Secondary | ICD-10-CM | POA: Diagnosis not present

## 2022-08-30 DIAGNOSIS — E785 Hyperlipidemia, unspecified: Secondary | ICD-10-CM | POA: Diagnosis not present

## 2022-08-30 DIAGNOSIS — H25813 Combined forms of age-related cataract, bilateral: Secondary | ICD-10-CM | POA: Diagnosis not present

## 2022-08-31 ENCOUNTER — Other Ambulatory Visit (HOSPITAL_COMMUNITY): Payer: Self-pay

## 2022-08-31 DIAGNOSIS — H2511 Age-related nuclear cataract, right eye: Secondary | ICD-10-CM | POA: Diagnosis not present

## 2022-08-31 DIAGNOSIS — M5412 Radiculopathy, cervical region: Secondary | ICD-10-CM | POA: Diagnosis not present

## 2022-09-08 DIAGNOSIS — J45909 Unspecified asthma, uncomplicated: Secondary | ICD-10-CM | POA: Diagnosis not present

## 2022-09-08 DIAGNOSIS — M1990 Unspecified osteoarthritis, unspecified site: Secondary | ICD-10-CM | POA: Diagnosis not present

## 2022-09-08 DIAGNOSIS — H25812 Combined forms of age-related cataract, left eye: Secondary | ICD-10-CM | POA: Diagnosis not present

## 2022-09-08 DIAGNOSIS — Z7951 Long term (current) use of inhaled steroids: Secondary | ICD-10-CM | POA: Diagnosis not present

## 2022-09-08 DIAGNOSIS — Z7982 Long term (current) use of aspirin: Secondary | ICD-10-CM | POA: Diagnosis not present

## 2022-09-08 DIAGNOSIS — E785 Hyperlipidemia, unspecified: Secondary | ICD-10-CM | POA: Diagnosis not present

## 2022-09-08 DIAGNOSIS — Z7985 Long-term (current) use of injectable non-insulin antidiabetic drugs: Secondary | ICD-10-CM | POA: Diagnosis not present

## 2022-09-08 DIAGNOSIS — G4733 Obstructive sleep apnea (adult) (pediatric): Secondary | ICD-10-CM | POA: Diagnosis not present

## 2022-09-08 DIAGNOSIS — E1136 Type 2 diabetes mellitus with diabetic cataract: Secondary | ICD-10-CM | POA: Diagnosis not present

## 2022-09-08 DIAGNOSIS — Z888 Allergy status to other drugs, medicaments and biological substances status: Secondary | ICD-10-CM | POA: Diagnosis not present

## 2022-09-08 DIAGNOSIS — Z79890 Hormone replacement therapy (postmenopausal): Secondary | ICD-10-CM | POA: Diagnosis not present

## 2022-09-08 DIAGNOSIS — H25813 Combined forms of age-related cataract, bilateral: Secondary | ICD-10-CM | POA: Diagnosis not present

## 2022-09-08 DIAGNOSIS — Z881 Allergy status to other antibiotic agents status: Secondary | ICD-10-CM | POA: Diagnosis not present

## 2022-09-08 DIAGNOSIS — E039 Hypothyroidism, unspecified: Secondary | ICD-10-CM | POA: Diagnosis not present

## 2022-09-08 DIAGNOSIS — Z882 Allergy status to sulfonamides status: Secondary | ICD-10-CM | POA: Diagnosis not present

## 2022-09-08 DIAGNOSIS — I10 Essential (primary) hypertension: Secondary | ICD-10-CM | POA: Diagnosis not present

## 2022-09-22 ENCOUNTER — Other Ambulatory Visit (HOSPITAL_COMMUNITY): Payer: Self-pay

## 2022-09-22 MED FILL — Levothyroxine Sodium Tab 137 MCG: ORAL | 90 days supply | Qty: 90 | Fill #1 | Status: AC

## 2022-09-22 MED FILL — Ezetimibe Tab 10 MG: ORAL | 90 days supply | Qty: 90 | Fill #1 | Status: AC

## 2022-09-22 MED FILL — Potassium Chloride Microencapsulated Crys ER Tab 20 mEq: ORAL | 90 days supply | Qty: 180 | Fill #1 | Status: AC

## 2022-09-27 ENCOUNTER — Other Ambulatory Visit (HOSPITAL_COMMUNITY): Payer: Self-pay

## 2022-09-30 ENCOUNTER — Other Ambulatory Visit (HOSPITAL_COMMUNITY): Payer: Self-pay

## 2022-09-30 ENCOUNTER — Other Ambulatory Visit: Payer: Self-pay | Admitting: Cardiovascular Disease

## 2022-09-30 DIAGNOSIS — M170 Bilateral primary osteoarthritis of knee: Secondary | ICD-10-CM | POA: Diagnosis not present

## 2022-09-30 MED ORDER — LOSARTAN POTASSIUM 25 MG PO TABS
12.5000 mg | ORAL_TABLET | Freq: Every day | ORAL | 1 refills | Status: DC
  Filled 2022-09-30: qty 45, 90d supply, fill #0
  Filled 2023-01-19 – 2023-02-25 (×3): qty 45, 90d supply, fill #1

## 2022-10-06 DIAGNOSIS — Z6838 Body mass index (BMI) 38.0-38.9, adult: Secondary | ICD-10-CM | POA: Diagnosis not present

## 2022-10-06 DIAGNOSIS — R102 Pelvic and perineal pain: Secondary | ICD-10-CM | POA: Diagnosis not present

## 2022-10-06 DIAGNOSIS — Z01419 Encounter for gynecological examination (general) (routine) without abnormal findings: Secondary | ICD-10-CM | POA: Diagnosis not present

## 2022-10-07 ENCOUNTER — Other Ambulatory Visit (HOSPITAL_COMMUNITY): Payer: Self-pay

## 2022-10-07 MED ORDER — VEOZAH 45 MG PO TABS
45.0000 mg | ORAL_TABLET | Freq: Every day | ORAL | 3 refills | Status: DC
  Filled 2022-10-07: qty 30, 30d supply, fill #0

## 2022-10-13 ENCOUNTER — Other Ambulatory Visit: Payer: Self-pay | Admitting: Obstetrics and Gynecology

## 2022-10-13 DIAGNOSIS — R928 Other abnormal and inconclusive findings on diagnostic imaging of breast: Secondary | ICD-10-CM

## 2022-10-19 ENCOUNTER — Other Ambulatory Visit (INDEPENDENT_AMBULATORY_CARE_PROVIDER_SITE_OTHER): Payer: 59

## 2022-10-19 ENCOUNTER — Other Ambulatory Visit: Payer: Self-pay

## 2022-10-19 DIAGNOSIS — E538 Deficiency of other specified B group vitamins: Secondary | ICD-10-CM

## 2022-10-19 DIAGNOSIS — R7303 Prediabetes: Secondary | ICD-10-CM | POA: Diagnosis not present

## 2022-10-19 DIAGNOSIS — E7800 Pure hypercholesterolemia, unspecified: Secondary | ICD-10-CM

## 2022-10-19 DIAGNOSIS — E559 Vitamin D deficiency, unspecified: Secondary | ICD-10-CM | POA: Diagnosis not present

## 2022-10-19 LAB — URINALYSIS, ROUTINE W REFLEX MICROSCOPIC
Bilirubin Urine: NEGATIVE
Hgb urine dipstick: NEGATIVE
Ketones, ur: NEGATIVE
Leukocytes,Ua: NEGATIVE
Nitrite: NEGATIVE
RBC / HPF: NONE SEEN (ref 0–?)
Specific Gravity, Urine: 1.01 (ref 1.000–1.030)
Total Protein, Urine: NEGATIVE
Urine Glucose: 250 — AB
Urobilinogen, UA: 0.2 (ref 0.0–1.0)
pH: 5.5 (ref 5.0–8.0)

## 2022-10-19 LAB — HEPATIC FUNCTION PANEL
ALT: 15 U/L (ref 0–35)
AST: 15 U/L (ref 0–37)
Albumin: 4 g/dL (ref 3.5–5.2)
Alkaline Phosphatase: 96 U/L (ref 39–117)
Bilirubin, Direct: 0.1 mg/dL (ref 0.0–0.3)
Total Bilirubin: 0.5 mg/dL (ref 0.2–1.2)
Total Protein: 6.1 g/dL (ref 6.0–8.3)

## 2022-10-19 LAB — LIPID PANEL
Cholesterol: 168 mg/dL (ref 0–200)
HDL: 76 mg/dL (ref >39.00)
LDL Cholesterol: 75 mg/dL (ref 0–99)
NonHDL: 91.85
Total CHOL/HDL Ratio: 2
Triglycerides: 83 mg/dL (ref 0.0–149.0)
VLDL: 16.6 mg/dL (ref 0.0–40.0)

## 2022-10-19 LAB — BASIC METABOLIC PANEL
BUN: 15 mg/dL (ref 6–23)
CO2: 28 mEq/L (ref 19–32)
Calcium: 9.5 mg/dL (ref 8.4–10.5)
Chloride: 103 mEq/L (ref 96–112)
Creatinine, Ser: 1.13 mg/dL (ref 0.40–1.20)
GFR: 51.04 mL/min — ABNORMAL LOW (ref >60.00)
Glucose, Bld: 71 mg/dL (ref 70–99)
Potassium: 3.9 mEq/L (ref 3.5–5.1)
Sodium: 141 mEq/L (ref 135–145)

## 2022-10-19 LAB — CBC WITH DIFFERENTIAL/PLATELET
Basophils Absolute: 0.1 10*3/uL (ref 0.0–0.1)
Basophils Relative: 0.6 % (ref 0.0–3.0)
Eosinophils Absolute: 0.1 10*3/uL (ref 0.0–0.7)
Eosinophils Relative: 1 % (ref 0.0–5.0)
HCT: 41.2 % (ref 36.0–46.0)
Hemoglobin: 13.4 g/dL (ref 12.0–15.0)
Lymphocytes Relative: 23.4 % (ref 12.0–46.0)
Lymphs Abs: 2.8 10*3/uL (ref 0.7–4.0)
MCHC: 32.6 g/dL (ref 30.0–36.0)
MCV: 90.5 fl (ref 78.0–100.0)
Monocytes Absolute: 0.9 10*3/uL (ref 0.1–1.0)
Monocytes Relative: 7.2 % (ref 3.0–12.0)
Neutro Abs: 8.2 10*3/uL — ABNORMAL HIGH (ref 1.4–7.7)
Neutrophils Relative %: 67.8 % (ref 43.0–77.0)
Platelets: 281 10*3/uL (ref 150.0–400.0)
RBC: 4.56 Mil/uL (ref 3.87–5.11)
RDW: 14.1 % (ref 11.5–15.5)
WBC: 12.1 10*3/uL — ABNORMAL HIGH (ref 4.0–10.5)

## 2022-10-19 LAB — VITAMIN D 25 HYDROXY (VIT D DEFICIENCY, FRACTURES): VITD: 21.86 ng/mL — ABNORMAL LOW (ref 30.00–100.00)

## 2022-10-19 LAB — TSH: TSH: 2.09 u[IU]/mL (ref 0.35–5.50)

## 2022-10-19 LAB — MICROALBUMIN / CREATININE URINE RATIO
Creatinine,U: 94.7 mg/dL
Microalb Creat Ratio: 0.9 mg/g (ref 0.0–30.0)
Microalb, Ur: 0.8 mg/dL (ref 0.0–1.9)

## 2022-10-19 LAB — VITAMIN B12: Vitamin B-12: 189 pg/mL — ABNORMAL LOW (ref 211–911)

## 2022-10-19 LAB — HEMOGLOBIN A1C: Hgb A1c MFr Bld: 5.5 % (ref 4.6–6.5)

## 2022-10-21 ENCOUNTER — Other Ambulatory Visit (HOSPITAL_COMMUNITY): Payer: Self-pay

## 2022-10-21 ENCOUNTER — Ambulatory Visit: Payer: 59 | Admitting: Internal Medicine

## 2022-10-21 VITALS — BP 126/68 | HR 77 | Temp 97.7°F | Ht 64.0 in | Wt 219.0 lb

## 2022-10-21 DIAGNOSIS — Z0001 Encounter for general adult medical examination with abnormal findings: Secondary | ICD-10-CM | POA: Diagnosis not present

## 2022-10-21 DIAGNOSIS — I10 Essential (primary) hypertension: Secondary | ICD-10-CM

## 2022-10-21 DIAGNOSIS — E559 Vitamin D deficiency, unspecified: Secondary | ICD-10-CM | POA: Diagnosis not present

## 2022-10-21 DIAGNOSIS — Z23 Encounter for immunization: Secondary | ICD-10-CM

## 2022-10-21 DIAGNOSIS — E538 Deficiency of other specified B group vitamins: Secondary | ICD-10-CM

## 2022-10-21 DIAGNOSIS — R7303 Prediabetes: Secondary | ICD-10-CM | POA: Diagnosis not present

## 2022-10-21 DIAGNOSIS — D170 Benign lipomatous neoplasm of skin and subcutaneous tissue of head, face and neck: Secondary | ICD-10-CM | POA: Diagnosis not present

## 2022-10-21 DIAGNOSIS — E7800 Pure hypercholesterolemia, unspecified: Secondary | ICD-10-CM

## 2022-10-21 MED ORDER — SEMAGLUTIDE (2 MG/DOSE) 8 MG/3ML ~~LOC~~ SOPN
2.0000 mg | PEN_INJECTOR | SUBCUTANEOUS | 3 refills | Status: DC
  Filled 2022-10-21: qty 9, 84d supply, fill #0
  Filled 2023-01-19 – 2023-01-30 (×3): qty 9, 84d supply, fill #1
  Filled 2023-04-18: qty 9, 84d supply, fill #2
  Filled 2023-07-17: qty 9, 84d supply, fill #3

## 2022-10-21 NOTE — Progress Notes (Unsigned)
Patient ID: Suzanne Hansen, female   DOB: 1956/06/06, 67 y.o.   MRN: [id number]         Chief Complaint:: wellness exam and Physical (No concerns)  ***       HPI:  Suzanne Hansen is a 67 y.o. female here for wellness exam; will have Dxa with Gyn,                         Also losing wt with ozempic, knee pain better also with recent cortisone injections.    Wt Readings from Last 3 Encounters:  10/21/22 219 lb (99.3 kg)  08/23/22 230 lb (104.3 kg)  08/01/22 230 lb (104.3 kg)   BP Readings from Last 3 Encounters:  10/21/22 126/68  08/23/22 120/72  08/01/22 120/70   Immunization History  Administered Date(s) Administered   Fluad Quad(high Dose 65+) 08/01/2022   Influenza Inj Mdck Quad Pf 08/18/2021   Influenza Split 08/01/2011   Influenza,inj,Quad PF,6+ Mos 07/01/2014, 08/28/2015, 08/09/2016, 08/22/2017, 06/19/2019, 06/29/2020   Influenza,inj,Quad PF,6-35 Mos 06/19/2019   Influenza-Unspecified 06/19/2019   PFIZER Comirnaty(Gray Top)Covid-19 Tri-Sucrose Vaccine 12/06/2019, 12/25/2019, 08/28/2020   PFIZER(Purple Top)SARS-COV-2 Vaccination 12/06/2019, 12/25/2019, 08/28/2020   Pneumococcal Conjugate-13 02/16/2016   Pneumococcal Polysaccharide-23 09/27/2017   Td 04/09/2004   Td (Adult), 2 Lf Tetanus Toxid, Preservative Free 04/09/2004   Tdap 02/16/2016   Zoster, Live 05/29/2015   Health Maintenance Due  Topic Date Due   Pneumonia Vaccine 72+ Years old (3 - PPSV23 or PCV20) 09/27/2022   FOOT EXAM  10/19/2022      Past Medical History:  Diagnosis Date   ALLERGIC RHINITIS 05/09/2008   Allergy    ANXIETY 05/23/2007   ARM PAIN, LEFT 10/23/2009   Arthritis    ASTHMA 05/23/2007   Back pain    Cardiomyopathy ([place]) 09/08/2015   Cataract    Chest pain    CHF (congestive heart failure) (HCC)    Chronic diastolic heart failure ([place]) 01/22/2020   Constipation    CONTACT DERMATITIS 06/09/2009   Diabetes mellitus without complication ([place])    type II   Dyspnea    allergies-dust,  cig smoke, rag weeds   ENDOMETRIOSIS NOS 05/23/2007   FACIAL PAIN 06/02/2010   Food allergy    FREQUENCY, URINARY 05/17/2010   GERD (gastroesophageal reflux disease)    GLUCOSE INTOLERANCE 08/28/2009   HERPES SIMPLEX, UNCOMPLICATED 68/09/7516   Hyperlipidemia 08/28/2015   HYPERTHYROIDISM 05/23/2007   Hypoactive thyroid    HYPOTHYROIDISM 05/09/2008   Impaired glucose tolerance 01/28/2011   INSOMNIA, HX OF 05/23/2007   Joint pain    LATERAL EPICONDYLITIS, RIGHT 03/10/2009   LIPOMA 10/22/2010   NECK PAIN 11/10/2010   OBESITY 05/23/2007   Osteopenia 09/28/2018   Plantar fasciitis    Both feet   Pre-diabetes    SHOULDER PAIN, LEFT 05/17/2010   SINUSITIS- ACUTE-NOS 11/03/2008   SKIN LESION 11/10/2010   Sleep apnea    Stage III chronic kidney disease ([place])    Stage III   Unspecified Chest Pain 07/25/2008   UNSPECIFIED URTICARIA 06/04/2009   URI 08/28/2009   UTI 04/29/2008   VITAMIN D DEFICIENCY 08/28/2009   Wheezing 07/12/2010   Past Surgical History:  Procedure Laterality Date   COLONOSCOPY     COLONOSCOPY WITH PROPOFOL N/A 04/25/2022   Procedure: COLONOSCOPY WITH PROPOFOL;  Surgeon: Mauri Pole, MD;  [facility];  Service: Gastroenterology;  Laterality: N/A;   [place]  LAPAROTOMY     exploratory   lower back surgery  07/06/2017   cyst   POLYPECTOMY     POLYPECTOMY  04/25/2022   Procedure: POLYPECTOMY;  Surgeon: Mauri Pole, MD;  Location: [facility];  Service: Gastroenterology;;   s/p endometrial ablation  12/06   TUBAL LIGATION     UTERINE FIBROID SURGERY     WISDOM TOOTH EXTRACTION      reports that she quit smoking about 34 years ago. Her smoking use included cigarettes. She has a 6.00 pack-year smoking history. She has never used smokeless tobacco. She reports current alcohol use of about 1.0 standard drink of alcohol per week. She reports that she does not use drugs. family history includes COPD in  her father; Cancer in her maternal grandmother; Depression in her mother; Diabetes in her cousin and sister; Heart disease in her cousin and mother; Hypertension in her cousin and father; Kidney disease in her sister. Allergies  Allergen Reactions   Shellfish Allergy Anaphylaxis   Clarithromycin Nausea And Vomiting   Latex Itching and Other (See Comments)    ITCHING AND COLD SORES AROUND MOUTH WHEN LATEX GLOVES USED BY THE DENTIST/HYGENTIST   Metronidazole Nausea And Vomiting   Zostavax [Zoster Vaccine Live]     Arm swelled   Adhesive [Tape] Dermatitis    Plastic tape    Amiloride     foot numbness   Clindamycin Nausea Only and Nausea And Vomiting   Gadobenate     Pt has had some form of dye without issues but isnt sure if she is allergic to this specific agent    Lisinopril Cough   Current Outpatient Medications on File Prior to Visit  Medication Sig Dispense Refill   albuterol (VENTOLIN HFA) 108 (90 Base) MCG/ACT inhaler INHALE 2 PUFFS BY MOUTH EVERY 4 HOURS AS NEEDED (Patient taking differently: 1-2 puffs every 4 (four) hours as needed for wheezing or shortness of breath.) 6.7 g 11   allopurinol (ZYLOPRIM) 100 MG tablet TAKE 2 TABLETS (200 MG TOTAL) BY MOUTH DAILY. 180 tablet 3   aspirin 81 MG EC tablet Take 81 mg by mouth daily.     dapagliflozin propanediol (FARXIGA) 5 MG TABS tablet Take 1 tablet (5 mg total) by mouth daily before breakfast. 90 tablet 3   ezetimibe (ZETIA) 10 MG tablet Take 1 tablet (10 mg total) by mouth daily. 90 tablet 3   fexofenadine (ALLEGRA) 180 MG tablet Take 180 mg by mouth daily.     Fezolinetant (VEOZAH) 45 MG TABS Take 45 mg by mouth daily. 30 tablet 3   gabapentin (NEURONTIN) 100 MG capsule Take 1 capsule (100 mg total) by mouth 2 (two) times daily as needed. 180 capsule 2   levothyroxine (SYNTHROID) 137 MCG tablet TAKE 1 TABLET BY MOUTH DAILY BEFORE BREAKFAST. 90 tablet 3   losartan (COZAAR) 25 MG tablet Take 1/2 tablet (12.5 mg total) by mouth  daily. 45 tablet 1   pantoprazole (PROTONIX) 40 MG tablet Take 1 tablet (40 mg total) by mouth daily. 90 tablet 3   potassium chloride SA (KLOR-CON M) 20 MEQ tablet Take 1 tablet (20 mEq total) by mouth daily. OK to take extra on days you take extra torsemide. 180 tablet 2   predniSONE (DELTASONE) 10 MG tablet Take 3 tablets by mouth once daily for 3 days-- 2 tablets daily for 3 days-- 1 tablet daily for 3 days 18 tablet 0   Semaglutide, 1 MG/DOSE, (OZEMPIC, 1 MG/DOSE,) 4 MG/3ML SOPN Inject  1 mg into the skin once a week. 9 mL 3   SYMBICORT 160-4.5 MCG/ACT inhaler Inhale 2 puffs into the lungs in the morning and at bedtime. 10.2 g 11   torsemide (DEMADEX) 20 MG tablet Take 3 tablets (60 mg total) by mouth daily. 270 tablet 1   valACYclovir (VALTREX) 1000 MG tablet Take by mouth.     No current facility-administered medications on file prior to visit.        ROS:  All others reviewed and negative.  Objective        PE:  BP 126/68 (BP Location: Right Arm, Patient Position: Sitting, Cuff Size: Large)   Pulse 77   Temp 97.7 F (36.5 C) (Oral)   Ht '5\' 4"'$  (1.626 m)   Wt 219 lb (99.3 kg)   SpO2 99%   BMI 37.59 kg/m                 Constitutional: Pt appears in NAD               HENT: Head: NCAT.                Right Ear: External ear normal.                 Left Ear: External ear normal.                Eyes: . Pupils are equal, round, and reactive to light. Conjunctivae and EOM are normal               Nose: without d/c or deformity               Neck: Neck supple. Gross normal ROM               Cardiovascular: Normal rate and regular rhythm.                 Pulmonary/Chest: Effort normal and breath sounds without rales or wheezing.                Abd:  Soft, NT, ND, + BS, no organomegaly               Neurological: Pt is alert. At baseline orientation, motor grossly intact               Skin: Skin is warm. No rashes, no other new lesions, LE edema - ***               Psychiatric: Pt  behavior is normal without agitation   Micro: none  Cardiac tracings I have personally interpreted today:  none  Pertinent Radiological findings (summarize): none   Lab Results  Component Value Date   WBC 12.1 (H) 10/19/2022   HGB 13.4 10/19/2022   HCT 41.2 10/19/2022   PLT 281.0 10/19/2022   GLUCOSE 71 10/19/2022   CHOL 168 10/19/2022   TRIG 83.0 10/19/2022   HDL 76.00 10/19/2022   LDLCALC 75 10/19/2022   ALT 15 10/19/2022   AST 15 10/19/2022   NA 141 10/19/2022   K 3.9 10/19/2022   CL 103 10/19/2022   CREATININE 1.13 10/19/2022   BUN 15 10/19/2022   CO2 28 10/19/2022   TSH 2.09 10/19/2022   INR 1.0 03/21/2017   HGBA1C 5.5 10/19/2022   MICROALBUR 0.8 10/19/2022   Assessment/Plan:  Suzanne Hansen is a 67 y.o. Black or African American [2] female with  has a past medical history of ALLERGIC RHINITIS (05/09/2008), Allergy,  ANXIETY (05/23/2007), ARM PAIN, LEFT (10/23/2009), Arthritis, ASTHMA (05/23/2007), Back pain, Cardiomyopathy ([place]) (09/08/2015), Cataract, Chest pain, CHF (congestive heart failure) ([place]), Chronic diastolic heart failure ([place]) (01/22/2020), Constipation, CONTACT DERMATITIS (06/09/2009), Diabetes mellitus without complication ([place]), Dyspnea, ENDOMETRIOSIS NOS (05/23/2007), FACIAL PAIN (06/02/2010), Food allergy, FREQUENCY, URINARY (05/17/2010), GERD (gastroesophageal reflux disease), GLUCOSE INTOLERANCE (08/28/2009), HERPES SIMPLEX, UNCOMPLICATED (94/85/4627), Hyperlipidemia (08/28/2015), HYPERTHYROIDISM (05/23/2007), Hypoactive thyroid, HYPOTHYROIDISM (05/09/2008), Impaired glucose tolerance (01/28/2011), INSOMNIA, HX OF (05/23/2007), Joint pain, LATERAL EPICONDYLITIS, RIGHT (03/10/2009), LIPOMA (10/22/2010), NECK PAIN (11/10/2010), OBESITY (05/23/2007), Osteopenia (09/28/2018), Plantar fasciitis, Pre-diabetes, SHOULDER PAIN, LEFT (05/17/2010), SINUSITIS- ACUTE-NOS (11/03/2008), SKIN LESION (11/10/2010), Sleep apnea, Stage III chronic kidney disease ([place]),  Unspecified Chest Pain (07/25/2008), UNSPECIFIED URTICARIA (06/04/2009), URI (08/28/2009), UTI (04/29/2008), VITAMIN D DEFICIENCY (08/28/2009), and Wheezing (07/12/2010).  No problem-specific Assessment & Plan notes found for this encounter.  Followup: No follow-ups on file.  Cathlean Cower, MD 10/21/2022 10:05 AM [facility]

## 2022-10-21 NOTE — Patient Instructions (Addendum)
You had the Pneumovax pneumonia shot today  Please take OTC Vitamin D3 at 2000 units per day, indefinitely, as well as the OTC B12 1000 mcg per day for 6 months  Ok to increase the ozempic to 2 mg  Please continue all other medications as before, and refills have been done if requested.  Please have the pharmacy call with any other refills you may need.  Please continue your efforts at being more active, low cholesterol diet, and weight control.  You are otherwise up to date with prevention measures today.  Please keep your appointments with your specialists as you may have planned  You will be contacted regarding the referral for: General Surgury  Please make an Appointment to return in 6 months, or sooner if needed, also with Lab Appointment for testing done 3-5 days before at the [facility] (so this is for TWO appointments - please see the scheduling desk as you leave)

## 2022-10-22 ENCOUNTER — Encounter: Payer: Self-pay | Admitting: Internal Medicine

## 2022-10-22 DIAGNOSIS — E538 Deficiency of other specified B group vitamins: Secondary | ICD-10-CM | POA: Insufficient documentation

## 2022-10-22 NOTE — Assessment & Plan Note (Signed)
BP Readings from Last 3 Encounters:  10/21/22 126/68  08/23/22 120/72  08/01/22 120/70   Stable, pt to continue medical treatment losartan 25 mg qd

## 2022-10-22 NOTE — Assessment & Plan Note (Signed)
Lab Results  Component Value Date   VITAMINB12 189 (L) 10/19/2022   Low, to start oral replacement - b12 1000 mcg qd

## 2022-10-22 NOTE — Assessment & Plan Note (Signed)
Age and sex appropriate education and counseling updated with regular exercise and diet Referrals for preventative services - for DXA with GYN soon Immunizations addressed - for pneumovax  Smoking counseling  - none needed Evidence for depression or other mood disorder - none significant Most recent labs reviewed. I have personally reviewed and have noted: 1) the patient's medical and social history 2) The patient's current medications and supplements 3) The patient's height, weight, and BMI have been recorded in the chart

## 2022-10-22 NOTE — Assessment & Plan Note (Signed)
Possibly enlarging, for general surgury referral

## 2022-10-22 NOTE — Assessment & Plan Note (Signed)
Lab Results  Component Value Date   [place] 75 10/19/2022   uncontrolled, goal ldl < 70, declines statin today, cont lower chol diet

## 2022-10-22 NOTE — Assessment & Plan Note (Signed)
With signficiant wt loss, ok for increased ozempic 2 mg weekly

## 2022-10-22 NOTE — Assessment & Plan Note (Signed)
Last vitamin D Lab Results  Component Value Date   VD25OH 21.86 (L) 10/19/2022   Low, to restart oral replacement

## 2022-10-25 ENCOUNTER — Telehealth: Payer: Self-pay | Admitting: Internal Medicine

## 2022-10-25 ENCOUNTER — Other Ambulatory Visit (HOSPITAL_COMMUNITY): Payer: Self-pay

## 2022-10-25 MED ORDER — NIRMATRELVIR/RITONAVIR (PAXLOVID) TABLET (RENAL DOSING)
2.0000 | ORAL_TABLET | Freq: Two times a day (BID) | ORAL | 0 refills | Status: AC
  Filled 2022-10-25: qty 20, 5d supply, fill #0

## 2022-10-25 NOTE — Telephone Encounter (Signed)
Patient is now positive for covid  and is requesting meds. LOV 10/21/22

## 2022-10-25 NOTE — Telephone Encounter (Signed)
Ok I sent paxlovid to pharmacy

## 2022-10-25 NOTE — Telephone Encounter (Signed)
Pt was here on Friday for CPE and spoke to Dr. Jenny Reichmann about some sinus issues. Had tested negative for covid previously, but has taken 2 tests today and both are positive. Pt states she has a lot of head pressure and that her chest is starting to feel slightly tight.   Pt is requesting we send antibiotics to the  [facility]   Phone: [phone]  Fax: [phone]

## 2022-10-25 NOTE — Telephone Encounter (Signed)
Patient informed of Rx for covid

## 2022-11-01 ENCOUNTER — Other Ambulatory Visit: Payer: Self-pay

## 2022-11-09 ENCOUNTER — Other Ambulatory Visit: Payer: Self-pay

## 2022-11-11 ENCOUNTER — Other Ambulatory Visit (HOSPITAL_COMMUNITY): Payer: Self-pay

## 2022-11-11 MED ORDER — DESONIDE 0.05 % EX CREA
TOPICAL_CREAM | CUTANEOUS | 3 refills | Status: DC
  Filled 2022-11-11: qty 30, 15d supply, fill #0

## 2022-11-11 MED FILL — Allopurinol Tab 100 MG: ORAL | 90 days supply | Qty: 180 | Fill #2 | Status: AC

## 2022-11-14 ENCOUNTER — Other Ambulatory Visit (HOSPITAL_COMMUNITY): Payer: Self-pay

## 2022-11-14 MED ORDER — GABAPENTIN 100 MG PO CAPS
100.0000 mg | ORAL_CAPSULE | Freq: Two times a day (BID) | ORAL | 1 refills | Status: DC
  Filled 2022-11-14: qty 60, 30d supply, fill #0
  Filled 2023-01-19 – 2023-05-20 (×3): qty 60, 30d supply, fill #1

## 2022-11-22 ENCOUNTER — Ambulatory Visit (INDEPENDENT_AMBULATORY_CARE_PROVIDER_SITE_OTHER): Payer: 59

## 2022-11-22 ENCOUNTER — Ambulatory Visit: Payer: 59 | Admitting: Internal Medicine

## 2022-11-22 ENCOUNTER — Other Ambulatory Visit (HOSPITAL_COMMUNITY): Payer: Self-pay

## 2022-11-22 VITALS — BP 126/70 | HR 98 | Temp 98.2°F | Ht 64.0 in | Wt 216.0 lb

## 2022-11-22 DIAGNOSIS — R7303 Prediabetes: Secondary | ICD-10-CM

## 2022-11-22 DIAGNOSIS — E559 Vitamin D deficiency, unspecified: Secondary | ICD-10-CM

## 2022-11-22 DIAGNOSIS — E538 Deficiency of other specified B group vitamins: Secondary | ICD-10-CM

## 2022-11-22 DIAGNOSIS — J45901 Unspecified asthma with (acute) exacerbation: Secondary | ICD-10-CM

## 2022-11-22 MED ORDER — PREDNISONE 10 MG PO TABS
ORAL_TABLET | ORAL | 0 refills | Status: AC
  Filled 2022-11-22: qty 18, 9d supply, fill #0

## 2022-11-22 MED ORDER — HYDROCOD POLI-CHLORPHE POLI ER 10-8 MG/5ML PO SUER
5.0000 mL | Freq: Two times a day (BID) | ORAL | 0 refills | Status: DC | PRN
  Filled 2022-11-22: qty 115, 12d supply, fill #0

## 2022-11-22 NOTE — Progress Notes (Unsigned)
Patient ID: Suzanne Hansen, female   DOB: Mar 02, 1956, 67 y.o.   MRN: [id number]        Chief Complaint: follow up hyperglycemia preDM , low b12, low vit d, obesity, cough       HPI:  Suzanne Hansen is a 67 y.o. female here with c/o since jan 15 covid infection tx with paxlovid a persistent scant prod cough without fever, chills, but with mild wheezing and faitigue, but Does have several wks ongoing nasal allergy symptoms with clearish congestion, itch and sneezing, without fever, pain, ST, cough, swelling or wheezing.  Pt denies chest pain, increased sob or doe, orthopnea, PND, increased LE swelling, palpitations, dizziness or syncope.   Pt denies polydipsia, polyuria, or new focal neuro s/s.    Pt denies fever, night sweats, loss of appetite, or other constitutional symptoms   Wt continues to decrease with ozempic.    Wt Readings from Last 3 Encounters:  11/22/22 216 lb (98 kg)  10/21/22 219 lb (99.3 kg)  08/23/22 230 lb (104.3 kg)   BP Readings from Last 3 Encounters:  11/22/22 126/70  10/21/22 126/68  08/23/22 120/72         Past Medical History:  Diagnosis Date   ALLERGIC RHINITIS 05/09/2008   Allergy    ANXIETY 05/23/2007   ARM PAIN, LEFT 10/23/2009   Arthritis    ASTHMA 05/23/2007   Back pain    Cardiomyopathy ([place]) 09/08/2015   Cataract    Chest pain    CHF (congestive heart failure) (HCC)    Chronic diastolic heart failure ([place]) 01/22/2020   Constipation    CONTACT DERMATITIS 06/09/2009   Diabetes mellitus without complication ([place])    type II   Dyspnea    allergies-dust, cig smoke, rag weeds   ENDOMETRIOSIS NOS 05/23/2007   FACIAL PAIN 06/02/2010   Food allergy    FREQUENCY, URINARY 05/17/2010   GERD (gastroesophageal reflux disease)    GLUCOSE INTOLERANCE 08/28/2009   HERPES SIMPLEX, UNCOMPLICATED 99991111   Hyperlipidemia 08/28/2015   HYPERTHYROIDISM 05/23/2007   Hypoactive thyroid    HYPOTHYROIDISM 05/09/2008   Impaired glucose tolerance 01/28/2011    INSOMNIA, HX OF 05/23/2007   Joint pain    LATERAL EPICONDYLITIS, RIGHT 03/10/2009   LIPOMA 10/22/2010   NECK PAIN 11/10/2010   OBESITY 05/23/2007   Osteopenia 09/28/2018   Plantar fasciitis    Both feet   Pre-diabetes    SHOULDER PAIN, LEFT 05/17/2010   SINUSITIS- ACUTE-NOS 11/03/2008   SKIN LESION 11/10/2010   Sleep apnea    Stage III chronic kidney disease ([place])    Stage III   Unspecified Chest Pain 07/25/2008   UNSPECIFIED URTICARIA 06/04/2009   URI 08/28/2009   UTI 04/29/2008   VITAMIN D DEFICIENCY 08/28/2009   Wheezing 07/12/2010   Past Surgical History:  Procedure Laterality Date   COLONOSCOPY     COLONOSCOPY WITH PROPOFOL N/A 04/25/2022   Procedure: COLONOSCOPY WITH PROPOFOL;  Surgeon: Mauri Pole, MD;  Location: [facility];  Service: Gastroenterology;  Laterality: N/A;   ENDOMETRIAL ABLATION     LAMINECTOMY  1996   LAPAROTOMY     exploratory   lower back surgery  07/06/2017   cyst   POLYPECTOMY     POLYPECTOMY  04/25/2022   Procedure: POLYPECTOMY;  Surgeon: Mauri Pole, MD;  Location: [facility];  Service: Gastroenterology;;   s/p endometrial ablation  12/06   [employer]  reports that she quit smoking about 35 years ago. Her smoking use included cigarettes. She has a 6.00 pack-year smoking history. She has never used smokeless tobacco. She reports current alcohol use of about 1.0 standard drink of alcohol per week. She reports that she does not use drugs. family history includes COPD in her father; Cancer in her maternal grandmother; Depression in her mother; Diabetes in her cousin and sister; Heart disease in her cousin and mother; Hypertension in her cousin and father; Kidney disease in her sister. Allergies  Allergen Reactions   Shellfish Allergy Anaphylaxis   Clarithromycin Nausea And Vomiting   Latex Itching and Other (See Comments)    ITCHING AND COLD SORES AROUND MOUTH  WHEN LATEX GLOVES USED BY THE DENTIST/HYGENTIST   Metronidazole Nausea And Vomiting   Zostavax [Zoster Vaccine Live]     Arm swelled   Adhesive [Tape] Dermatitis    Plastic tape    Amiloride     foot numbness   Clindamycin Nausea Only and Nausea And Vomiting   Gadobenate     Pt has had some form of dye without issues but isnt sure if she is allergic to this specific agent    Lisinopril Cough   Current Outpatient Medications on File Prior to Visit  Medication Sig Dispense Refill   albuterol (VENTOLIN HFA) 108 (90 Base) MCG/ACT inhaler INHALE 2 PUFFS BY MOUTH EVERY 4 HOURS AS NEEDED (Patient taking differently: 1-2 puffs every 4 (four) hours as needed for wheezing or shortness of breath.) 6.7 g 11   allopurinol (ZYLOPRIM) 100 MG tablet TAKE 2 TABLETS (200 MG TOTAL) BY MOUTH DAILY. 180 tablet 3   aspirin 81 MG EC tablet Take 81 mg by mouth daily.     dapagliflozin propanediol (FARXIGA) 5 MG TABS tablet Take 1 tablet (5 mg total) by mouth daily before breakfast. 90 tablet 3   doxycycline (VIBRAMYCIN) 100 MG capsule Two pills daily for 14 days as needed for flares. Take w large glass of water. Don't lay down for 30 mins after taking.     ezetimibe (ZETIA) 10 MG tablet Take 1 tablet (10 mg total) by mouth daily. 90 tablet 3   fexofenadine (ALLEGRA) 180 MG tablet Take 180 mg by mouth daily.     gabapentin (NEURONTIN) 100 MG capsule Take 1 capsule (100 mg total) by mouth 2 (two) times daily as needed. 180 capsule 2   gabapentin (NEURONTIN) 100 MG capsule Take 1 capsule (100 mg total) by mouth 2 (two) times daily. 60 capsule 1   levothyroxine (SYNTHROID) 137 MCG tablet TAKE 1 TABLET BY MOUTH DAILY BEFORE BREAKFAST. 90 tablet 3   losartan (COZAAR) 25 MG tablet Take 1/2 tablet (12.5 mg total) by mouth daily. 45 tablet 1   metroNIDAZOLE (METROCREAM) 0.75 % cream Apply topically.     pantoprazole (PROTONIX) 40 MG tablet Take 1 tablet (40 mg total) by mouth daily. 90 tablet 3   potassium chloride SA  (KLOR-CON M) 20 MEQ tablet Take 1 tablet (20 mEq total) by mouth daily. OK to take extra on days you take extra torsemide. 180 tablet 2   Semaglutide, 2 MG/DOSE, 8 MG/3ML SOPN Inject 2 mg into the skin once a week. 9 mL 3   SYMBICORT 160-4.5 MCG/ACT inhaler Inhale 2 puffs into the lungs in the morning and at bedtime. 10.2 g 11   torsemide (DEMADEX) 20 MG tablet Take 3 tablets (60 mg total) by mouth daily. 270 tablet 1   valACYclovir (VALTREX) 1000 MG tablet  Take by mouth.     acetaminophen (TYLENOL) 650 MG CR tablet Take by mouth.     Cholecalciferol (VITAMIN D-1000 MAX ST) 25 MCG (1000 UT) tablet Take by mouth.     No current facility-administered medications on file prior to visit.        ROS:  All others reviewed and negative.  Objective        PE:  BP 126/70 (BP Location: Left Arm, Patient Position: Sitting, Cuff Size: Large)   Pulse 98   Temp 98.2 F (36.8 C) (Oral)   Ht 5' 4"$  (1.626 m)   Wt 216 lb (98 kg)   SpO2 97%   BMI 37.08 kg/m                 Constitutional: Pt appears in NAD, not ill appearing               HENT: Head: NCAT.                Right Ear: External ear normal.                 Left Ear: External ear normal. Bilat tm's with mild erythema.  Max sinus areas non tender.  Pharynx with mild erythema, no exudate               Eyes: . Pupils are equal, round, and reactive to light. Conjunctivae and EOM are normal               Nose: without d/c or deformity               Neck: Neck supple. Gross normal ROM               Cardiovascular: Normal rate and regular rhythm.                Pulmonary/Chest: Effort normal and breath sounds without rales with trace bilat wheezing.                               Neurological: Pt is alert. At baseline orientation, motor grossly intact               Skin: Skin is warm. No rashes, no other new lesions, LE edema - none               Psychiatric: Pt behavior is normal without agitation   Micro: none  Cardiac tracings I have  personally interpreted today:  none  Pertinent Radiological findings (summarize): none   Lab Results  Component Value Date   WBC 12.1 (H) 10/19/2022   HGB 13.4 10/19/2022   HCT 41.2 10/19/2022   PLT 281.0 10/19/2022   GLUCOSE 71 10/19/2022   CHOL 168 10/19/2022   TRIG 83.0 10/19/2022   HDL 76.00 10/19/2022   LDLCALC 75 10/19/2022   ALT 15 10/19/2022   AST 15 10/19/2022   NA 141 10/19/2022   K 3.9 10/19/2022   CL 103 10/19/2022   CREATININE 1.13 10/19/2022   BUN 15 10/19/2022   CO2 28 10/19/2022   TSH 2.09 10/19/2022   INR 1.0 03/21/2017   HGBA1C 5.5 10/19/2022   MICROALBUR 0.8 10/19/2022   Assessment/Plan:  Suzanne Hansen is a 67 y.o. Black or African American [2] female with  has a past medical history of ALLERGIC RHINITIS (05/09/2008), Allergy, ANXIETY (05/23/2007), ARM PAIN, LEFT (10/23/2009), Arthritis, ASTHMA (05/23/2007), Back pain, Cardiomyopathy ([place]) (09/08/2015), Cataract,  Chest pain, CHF (congestive heart failure) ([place]), Chronic diastolic heart failure ([place]) (01/22/2020), Constipation, CONTACT DERMATITIS (06/09/2009), Diabetes mellitus without complication ([place]), Dyspnea, ENDOMETRIOSIS NOS (05/23/2007), FACIAL PAIN (06/02/2010), Food allergy, FREQUENCY, URINARY (05/17/2010), GERD (gastroesophageal reflux disease), GLUCOSE INTOLERANCE (08/28/2009), HERPES SIMPLEX, UNCOMPLICATED (99991111), Hyperlipidemia (08/28/2015), HYPERTHYROIDISM (05/23/2007), Hypoactive thyroid, HYPOTHYROIDISM (05/09/2008), Impaired glucose tolerance (01/28/2011), INSOMNIA, HX OF (05/23/2007), Joint pain, LATERAL EPICONDYLITIS, RIGHT (03/10/2009), LIPOMA (10/22/2010), NECK PAIN (11/10/2010), OBESITY (05/23/2007), Osteopenia (09/28/2018), Plantar fasciitis, Pre-diabetes, SHOULDER PAIN, LEFT (05/17/2010), SINUSITIS- ACUTE-NOS (11/03/2008), SKIN LESION (11/10/2010), Sleep apnea, Stage III chronic kidney disease ([place]), Unspecified Chest Pain (07/25/2008), UNSPECIFIED URTICARIA (06/04/2009), URI (08/28/2009),  UTI (04/29/2008), VITAMIN D DEFICIENCY (08/28/2009), and Wheezing (07/12/2010).  Asthma with exacerbation I suspect cough most likely related to asthma exacerbation, mild, post covid infection jan 15    For cxr, tussinex and prednisone taper,  to f/u any worsening symptoms or concerns  Prediabetes Lab Results  Component Value Date   HGBA1C 5.5 10/19/2022   Stable, pt to continue current medical treatment farxiga and ozempic   Vitamin D deficiency Last vitamin D Lab Results  Component Value Date   VD25OH 21.86 (L) 10/19/2022   Low to starrt oral replacement   B12 deficiency Lab Results  Component Value Date   VITAMINB12 189 (L) 10/19/2022   Low, reminded to start oral replacement - b12 1000 mcg qd  Followup: No follow-ups on file.  Cathlean Cower, MD 11/23/2022 5:06 AM [facility]

## 2022-11-22 NOTE — Patient Instructions (Signed)
Please take all new medication as prescribed - the cough medicine, and prednisone  Please continue all other medications as before, and refills have been done if requested.  Please have the pharmacy call with any other refills you may need.  Please keep your appointments with your specialists as you may have planned  Please go to the XRAY Department in the first floor for the x-ray testing  You will be contacted by phone if any changes need to be made immediately.  Otherwise, you will receive a letter about your results with an explanation, but please check with MyChart first.  Please remember to sign up for MyChart if you have not done so, as this will be important to you in the future with finding out test results, communicating by private email, and scheduling acute appointments online when needed.

## 2022-11-23 ENCOUNTER — Encounter: Payer: Self-pay | Admitting: Internal Medicine

## 2022-11-23 NOTE — Assessment & Plan Note (Signed)
Last vitamin D Lab Results  Component Value Date   VD25OH 21.86 (L) 10/19/2022   Low to starrt oral replacement

## 2022-11-23 NOTE — Assessment & Plan Note (Signed)
I suspect cough most likely related to asthma exacerbation, mild, post covid infection jan 15    For cxr, tussinex and prednisone taper,  to f/u any worsening symptoms or concerns

## 2022-11-23 NOTE — Assessment & Plan Note (Signed)
Lab Results  Component Value Date   VITAMINB12 189 (L) 10/19/2022   Low, reminded to start oral replacement - b12 1000 mcg qd

## 2022-11-23 NOTE — Assessment & Plan Note (Signed)
Lab Results  Component Value Date   HGBA1C 5.5 10/19/2022   Stable, pt to continue current medical treatment farxiga and ozempic

## 2022-11-24 ENCOUNTER — Other Ambulatory Visit (HOSPITAL_COMMUNITY): Payer: Self-pay

## 2022-11-24 ENCOUNTER — Ambulatory Visit
Admission: RE | Admit: 2022-11-24 | Discharge: 2022-11-24 | Disposition: A | Discharge status: Home / Self Care | Payer: 59 | Source: Ambulatory Visit | Attending: Obstetrics and Gynecology | Admitting: Obstetrics and Gynecology

## 2022-11-24 ENCOUNTER — Ambulatory Visit: Payer: 59 | Admitting: Internal Medicine

## 2022-11-24 ENCOUNTER — Ambulatory Visit
Admission: RE | Admit: 2022-11-24 | Discharge: 2022-11-24 | Disposition: A | Discharge status: Home / Self Care | Payer: Self-pay | Source: Ambulatory Visit | Attending: Obstetrics and Gynecology | Admitting: Obstetrics and Gynecology

## 2022-11-24 ENCOUNTER — Telehealth: Payer: Self-pay

## 2022-11-24 DIAGNOSIS — R928 Other abnormal and inconclusive findings on diagnostic imaging of breast: Secondary | ICD-10-CM

## 2022-11-24 MED ORDER — LEVOFLOXACIN 500 MG PO TABS
500.0000 mg | ORAL_TABLET | Freq: Every day | ORAL | 0 refills | Status: AC
  Filled 2022-11-24: qty 10, 10d supply, fill #0

## 2022-11-24 NOTE — Telephone Encounter (Signed)
Pt called and stated she seen PCP advise on her chest x-ray results which stated "except the chest xray shows possible small pneumonia on the right lower lung. .  Please continue the same plan with the antibiotic." However, the pt has stated PCP did not rx any ABX and the one she does take is for  her Rosacea.  **Pt is requesting that PCP nurse please call her with an update on what should she do or will PCP rx the ABX?

## 2022-11-24 NOTE — Telephone Encounter (Signed)
Patient informed. 

## 2022-11-24 NOTE — Telephone Encounter (Signed)
Doxycycline was listed on chart as of feb 9, but apparently she is not taking this  [place] for levaquin course - done erx

## 2022-11-24 NOTE — Telephone Encounter (Signed)
Please send abx to pharmacy as previously discussed with patient and result note

## 2022-11-25 ENCOUNTER — Ambulatory Visit: Payer: 59 | Admitting: Internal Medicine

## 2022-11-27 DIAGNOSIS — S5002XA Contusion of left elbow, initial encounter: Secondary | ICD-10-CM | POA: Insufficient documentation

## 2022-11-27 DIAGNOSIS — S4350XA Sprain of unspecified acromioclavicular joint, initial encounter: Secondary | ICD-10-CM | POA: Insufficient documentation

## 2022-11-27 DIAGNOSIS — S63502A Unspecified sprain of left wrist, initial encounter: Secondary | ICD-10-CM | POA: Insufficient documentation

## 2022-12-06 ENCOUNTER — Other Ambulatory Visit: Payer: Self-pay | Admitting: General Surgery

## 2022-12-06 DIAGNOSIS — D171 Benign lipomatous neoplasm of skin and subcutaneous tissue of trunk: Secondary | ICD-10-CM

## 2022-12-09 ENCOUNTER — Other Ambulatory Visit (HOSPITAL_COMMUNITY): Payer: Self-pay

## 2022-12-16 LAB — LAB REPORT - SCANNED
Albumin, Urine POC: 4.6
Creatinine, POC: 100.9 mg/dL
Microalb Creat Ratio: 5

## 2023-01-04 ENCOUNTER — Other Ambulatory Visit (HOSPITAL_COMMUNITY): Payer: Self-pay

## 2023-01-04 MED ORDER — METRONIDAZOLE 0.75 % VA GEL
VAGINAL | 0 refills | Status: DC
  Filled 2023-01-04: qty 70, 7d supply, fill #0

## 2023-01-09 DIAGNOSIS — M25561 Pain in right knee: Secondary | ICD-10-CM | POA: Insufficient documentation

## 2023-01-10 ENCOUNTER — Other Ambulatory Visit (HOSPITAL_COMMUNITY): Payer: Self-pay

## 2023-01-10 ENCOUNTER — Other Ambulatory Visit: Payer: Self-pay

## 2023-01-10 ENCOUNTER — Telehealth: Payer: Self-pay | Admitting: Internal Medicine

## 2023-01-10 ENCOUNTER — Other Ambulatory Visit: Payer: Self-pay | Admitting: Internal Medicine

## 2023-01-10 MED ORDER — EZETIMIBE 10 MG PO TABS
10.0000 mg | ORAL_TABLET | Freq: Every day | ORAL | 3 refills | Status: DC
  Filled 2023-01-10: qty 90, 90d supply, fill #0
  Filled 2023-05-20: qty 90, 90d supply, fill #1
  Filled 2023-08-19 – 2023-10-25 (×2): qty 90, 90d supply, fill #2

## 2023-01-10 MED ORDER — LEVOTHYROXINE SODIUM 137 MCG PO TABS
ORAL_TABLET | ORAL | 3 refills | Status: DC
  Filled 2023-01-10: qty 90, 90d supply, fill #0
  Filled 2023-04-18: qty 90, 90d supply, fill #1
  Filled 2023-07-21: qty 90, 90d supply, fill #2
  Filled 2023-08-19 – 2023-10-25 (×2): qty 90, 90d supply, fill #3

## 2023-01-10 NOTE — Telephone Encounter (Signed)
Prescription Request  01/10/2023  LOV: 11/22/2022  What is the name of the medication or equipment:  Zetia - Needs prior authorization                                                                                  Symbicort - needs prior authorization                                                                                  Farxiga - needs prior authorization  Have you contacted your pharmacy to request a refill? Yes   Which pharmacy would you like this sent to?  [facility] [place] [address] Phone: [phone] Fax: [phone]    Patient notified that their request is being sent to the clinical staff for review and that they should receive a response within 2 business days.   Please advise at Mobile [phone] (mobile)

## 2023-01-10 NOTE — Telephone Encounter (Signed)
Send to Prior auth team../lmb

## 2023-01-11 ENCOUNTER — Ambulatory Visit
Admission: RE | Admit: 2023-01-11 | Discharge: 2023-01-11 | Disposition: A | Discharge status: Home / Self Care | Payer: 59 | Source: Ambulatory Visit | Attending: General Surgery | Admitting: General Surgery

## 2023-01-11 ENCOUNTER — Other Ambulatory Visit (HOSPITAL_COMMUNITY): Payer: Self-pay

## 2023-01-11 DIAGNOSIS — D171 Benign lipomatous neoplasm of skin and subcutaneous tissue of trunk: Secondary | ICD-10-CM

## 2023-01-13 ENCOUNTER — Telehealth: Payer: Self-pay

## 2023-01-13 ENCOUNTER — Other Ambulatory Visit (HOSPITAL_COMMUNITY): Payer: Self-pay

## 2023-01-13 NOTE — Telephone Encounter (Addendum)
PA submitted for Farxiga via CMM. Key: [id number]. Status pending  PA submitted for Symbicort via CMM. [id number]. Status pending

## 2023-01-13 NOTE — Telephone Encounter (Signed)
Zetia filled, no pa needed  Next fill 03/23/2023

## 2023-01-13 NOTE — Telephone Encounter (Signed)
Pharmacy Patient Advocate Encounter   Received notification that prior authorization for Farxiga 5MG  tablets is required/requested.  Per Test Claim: Step therapy required   PA submitted on 01/13/23 to (ins) [employer] via CoverMyMeds Key or (Medicaid) confirmation # [id number] Status is pending

## 2023-01-14 ENCOUNTER — Other Ambulatory Visit (HOSPITAL_COMMUNITY): Payer: Self-pay

## 2023-01-16 ENCOUNTER — Telehealth: Payer: Self-pay

## 2023-01-16 ENCOUNTER — Other Ambulatory Visit (HOSPITAL_COMMUNITY): Payer: Self-pay

## 2023-01-16 NOTE — Telephone Encounter (Signed)
PA for Symbicort approved. Effective dates: 01/16/23 through 01/16/24

## 2023-01-16 NOTE — Telephone Encounter (Deleted)
PA submitted for Symbicort via CMM. [id number]. Status pending

## 2023-01-16 NOTE — Telephone Encounter (Signed)
Patient Advocate Encounter  Prior Authorization for Symbicort 160-4.5MCG/ACT aerosol has been approved.    PA#  [id number] Effective dates: 01/16/23 through 01/16/24

## 2023-01-16 NOTE — Telephone Encounter (Signed)
Pharmacy Patient Advocate Encounter   Received notification that prior authorization for Symbicort 160-4.5MCG/ACT aerosol is required/requested.  Per Test Claim: PA required   PA submitted on 01/16/23 to (ins) [employer] via [employer] or [facility]) confirmation # [id number] Status is pending

## 2023-01-16 NOTE — Telephone Encounter (Signed)
PA for [place] approved. Effective dates: 01/13/23 to 01/13/24

## 2023-01-16 NOTE — Telephone Encounter (Signed)
Patient Advocate Encounter  Prior Authorization for Farxiga 5MG  tablets has been approved.    PA# [id number] Effective dates: 01/13/23 through 01/13/24

## 2023-01-19 ENCOUNTER — Other Ambulatory Visit: Payer: Self-pay

## 2023-01-20 ENCOUNTER — Other Ambulatory Visit (HOSPITAL_COMMUNITY): Payer: Self-pay

## 2023-01-25 ENCOUNTER — Telehealth: Payer: Self-pay | Admitting: *Deleted

## 2023-01-25 ENCOUNTER — Other Ambulatory Visit (HOSPITAL_COMMUNITY): Payer: Self-pay

## 2023-01-25 NOTE — Telephone Encounter (Signed)
Pt sent msg stating..forwarding to Rx PA Team../lmb  " ----- Message -----      From:Suzanne Hansen      Sent:01/25/2023  1:17 PM EDT        BM:[facility] Message Message List   Subject:Prior Authorization  Has the PA for Ozempic been completed? What is the status?

## 2023-01-26 NOTE — Telephone Encounter (Signed)
Rec'd msg " Pt not found". Resubmitted under [employer] w/(Key: [id number]) Pa sent to [employer].Marland KitchenRaechel Chute

## 2023-01-26 NOTE — Telephone Encounter (Signed)
Submitted PA w/ (Key: [id number]). Waiting on insurance response.Marland KitchenRaechel Chute

## 2023-01-30 ENCOUNTER — Other Ambulatory Visit (HOSPITAL_COMMUNITY): Payer: Self-pay

## 2023-01-30 NOTE — Telephone Encounter (Signed)
Rec'd approval for Ozempic. Effective  01/27/2023 to 01/26/2026. Also sent pt msg via mychart w/ status

## 2023-02-03 ENCOUNTER — Other Ambulatory Visit (HOSPITAL_COMMUNITY): Payer: Self-pay

## 2023-02-25 ENCOUNTER — Other Ambulatory Visit (HOSPITAL_COMMUNITY): Payer: Self-pay

## 2023-02-27 ENCOUNTER — Other Ambulatory Visit (HOSPITAL_COMMUNITY): Payer: Self-pay

## 2023-02-27 ENCOUNTER — Other Ambulatory Visit: Payer: Self-pay

## 2023-02-28 ENCOUNTER — Other Ambulatory Visit (HOSPITAL_COMMUNITY): Payer: Self-pay

## 2023-03-01 ENCOUNTER — Other Ambulatory Visit (HOSPITAL_COMMUNITY): Payer: Self-pay

## 2023-03-28 ENCOUNTER — Other Ambulatory Visit (INDEPENDENT_AMBULATORY_CARE_PROVIDER_SITE_OTHER): Payer: 59

## 2023-03-28 DIAGNOSIS — E559 Vitamin D deficiency, unspecified: Secondary | ICD-10-CM

## 2023-03-28 DIAGNOSIS — E538 Deficiency of other specified B group vitamins: Secondary | ICD-10-CM | POA: Diagnosis not present

## 2023-03-28 DIAGNOSIS — I10 Essential (primary) hypertension: Secondary | ICD-10-CM | POA: Diagnosis not present

## 2023-03-28 DIAGNOSIS — R7303 Prediabetes: Secondary | ICD-10-CM | POA: Diagnosis not present

## 2023-03-28 LAB — HEPATIC FUNCTION PANEL
ALT: 18 U/L (ref 0–35)
AST: 16 U/L (ref 0–37)
Albumin: 4.1 g/dL (ref 3.5–5.2)
Alkaline Phosphatase: 77 U/L (ref 39–117)
Bilirubin, Direct: 0.1 mg/dL (ref 0.0–0.3)
Total Bilirubin: 0.6 mg/dL (ref 0.2–1.2)
Total Protein: 6.9 g/dL (ref 6.0–8.3)

## 2023-03-28 LAB — HEMOGLOBIN A1C: Hgb A1c MFr Bld: 5.2 % (ref 4.6–6.5)

## 2023-03-28 LAB — VITAMIN D 25 HYDROXY (VIT D DEFICIENCY, FRACTURES): VITD: 31.91 ng/mL (ref 30.00–100.00)

## 2023-03-28 LAB — BASIC METABOLIC PANEL
BUN: 12 mg/dL (ref 6–23)
CO2: 24 mEq/L (ref 19–32)
Calcium: 9.9 mg/dL (ref 8.4–10.5)
Chloride: 110 mEq/L (ref 96–112)
Creatinine, Ser: 1.18 mg/dL (ref 0.40–1.20)
GFR: 48.31 mL/min — ABNORMAL LOW (ref >60.00)
Glucose, Bld: 84 mg/dL (ref 70–99)
Potassium: 3.9 mEq/L (ref 3.5–5.1)
Sodium: 142 mEq/L (ref 135–145)

## 2023-03-28 LAB — LIPID PANEL
Cholesterol: 143 mg/dL (ref 0–200)
HDL: 68.1 mg/dL (ref >39.00)
LDL Cholesterol: 61 mg/dL (ref 0–99)
NonHDL: 74.88
Total CHOL/HDL Ratio: 2
Triglycerides: 68 mg/dL (ref 0.0–149.0)
VLDL: 13.6 mg/dL (ref 0.0–40.0)

## 2023-03-28 LAB — VITAMIN B12: Vitamin B-12: 296 pg/mL (ref 211–911)

## 2023-03-29 ENCOUNTER — Encounter: Payer: Self-pay | Admitting: Internal Medicine

## 2023-03-29 ENCOUNTER — Ambulatory Visit: Payer: 59 | Admitting: Internal Medicine

## 2023-03-29 VITALS — BP 124/72 | HR 73 | Temp 98.9°F | Ht 64.0 in | Wt 211.0 lb

## 2023-03-29 DIAGNOSIS — E538 Deficiency of other specified B group vitamins: Secondary | ICD-10-CM

## 2023-03-29 DIAGNOSIS — E559 Vitamin D deficiency, unspecified: Secondary | ICD-10-CM | POA: Diagnosis not present

## 2023-03-29 DIAGNOSIS — E119 Type 2 diabetes mellitus without complications: Secondary | ICD-10-CM | POA: Diagnosis not present

## 2023-03-29 DIAGNOSIS — E038 Other specified hypothyroidism: Secondary | ICD-10-CM

## 2023-03-29 DIAGNOSIS — N1831 Chronic kidney disease, stage 3a: Secondary | ICD-10-CM

## 2023-03-29 DIAGNOSIS — Z7985 Long-term (current) use of injectable non-insulin antidiabetic drugs: Secondary | ICD-10-CM

## 2023-03-29 DIAGNOSIS — E7800 Pure hypercholesterolemia, unspecified: Secondary | ICD-10-CM

## 2023-03-29 NOTE — Progress Notes (Signed)
Chief Complaint: follow up low vit d, ckd, htn, dm, hld       HPI:  Suzanne Hansen is a 67 y.o. female here overall doing ok.  Pt denies chest pain, increased sob or doe, wheezing, orthopnea, PND, increased LE swelling, palpitations, dizziness or syncope.   Pt denies polydipsia, polyuria, or new focal neuro s/s.    Pt denies fever, wt loss, night sweats, loss of appetite, or other constitutional symptoms   Lost 8 lbs since jan with ozempic 2 mg.  Peak wt has been 260 in past.  Now going to gym  with water aerobics.  Back and knee pain better.    Wt Readings from Last 3 Encounters:  03/29/23 211 lb (95.7 kg)  11/22/22 216 lb (98 kg)  10/21/22 219 lb (99.3 kg)   BP Readings from Last 3 Encounters:  03/29/23 124/72  11/22/22 126/70  10/21/22 126/68         Past Medical History:  Diagnosis Date   ALLERGIC RHINITIS 05/09/2008   Allergy    ANXIETY 05/23/2007   ARM PAIN, LEFT 10/23/2009   Arthritis    ASTHMA 05/23/2007   Back pain    Cardiomyopathy (HCC) 09/08/2015   Cataract    Chest pain    CHF (congestive heart failure) (HCC)    Chronic diastolic heart failure (HCC) 01/22/2020   Constipation    CONTACT DERMATITIS 06/09/2009   Diabetes mellitus without complication (HCC)    type II   Dyspnea    allergies-dust, cig smoke, rag weeds   ENDOMETRIOSIS NOS 05/23/2007   FACIAL PAIN 06/02/2010   Food allergy    FREQUENCY, URINARY 05/17/2010   GERD (gastroesophageal reflux disease)    GLUCOSE INTOLERANCE 08/28/2009   HERPES SIMPLEX, UNCOMPLICATED 05/23/2007   Hyperlipidemia 08/28/2015   HYPERTHYROIDISM 05/23/2007   Hypoactive thyroid    HYPOTHYROIDISM 05/09/2008   Impaired glucose tolerance 01/28/2011   INSOMNIA, HX OF 05/23/2007   Joint pain    LATERAL EPICONDYLITIS, RIGHT 03/10/2009   LIPOMA 10/22/2010   NECK PAIN 11/10/2010   OBESITY 05/23/2007   Osteopenia 09/28/2018   Plantar fasciitis    Both feet   Pre-diabetes    SHOULDER PAIN, LEFT 05/17/2010   SINUSITIS-  ACUTE-NOS 11/03/2008   SKIN LESION 11/10/2010   Sleep apnea    Stage III chronic kidney disease (HCC)    Stage III   Unspecified Chest Pain 07/25/2008   UNSPECIFIED URTICARIA 06/04/2009   URI 08/28/2009   UTI 04/29/2008   VITAMIN D DEFICIENCY 08/28/2009   Wheezing 07/12/2010   Past Surgical History:  Procedure Laterality Date   COLONOSCOPY     COLONOSCOPY WITH PROPOFOL N/A 04/25/2022   Procedure: COLONOSCOPY WITH PROPOFOL;  Surgeon: Napoleon Form, MD;  Location: [facility];  Service: Gastroenterology;  Laterality: N/A;   ENDOMETRIAL ABLATION     LAMINECTOMY  1996   LAPAROTOMY     exploratory   lower back surgery  07/06/2017   cyst   POLYPECTOMY     POLYPECTOMY  04/25/2022   Procedure: POLYPECTOMY;  Surgeon: Napoleon Form, MD;  Location: [facility];  Service: Gastroenterology;;   s/p endometrial ablation  12/06   TUBAL LIGATION     UTERINE FIBROID SURGERY     WISDOM TOOTH EXTRACTION      reports that she quit smoking about 35 years ago. Her smoking use included cigarettes. She has a 6.00 pack-year smoking history. She has never used smokeless tobacco. She reports current alcohol  use of about 1.0 standard drink of alcohol per week. She reports that she does not use drugs. family history includes COPD in her father; Cancer in her maternal grandmother; Depression in her mother; Diabetes in her cousin and sister; Heart disease in her cousin and mother; Hypertension in her cousin and father; Kidney disease in her sister. Allergies  Allergen Reactions   Shellfish Allergy Anaphylaxis   Clarithromycin Nausea And Vomiting   Latex Itching and Other (See Comments)    ITCHING AND COLD SORES AROUND MOUTH WHEN LATEX GLOVES USED BY THE DENTIST/HYGENTIST   Metronidazole Nausea And Vomiting   Zostavax [Zoster Vaccine Live]     Arm swelled   Adhesive [Tape] Dermatitis    Plastic tape    Amiloride     foot numbness   Clindamycin Nausea Only and Nausea And Vomiting    Gadobenate     Pt has had some form of dye without issues but isnt sure if she is allergic to this specific agent    Lisinopril Cough   Current Outpatient Medications on File Prior to Visit  Medication Sig Dispense Refill   acetaminophen (TYLENOL) 650 MG CR tablet Take by mouth.     albuterol (VENTOLIN HFA) 108 (90 Base) MCG/ACT inhaler INHALE 2 PUFFS BY MOUTH EVERY 4 HOURS AS NEEDED (Patient taking differently: 1-2 puffs every 4 (four) hours as needed for wheezing or shortness of breath.) 6.7 g 11   allopurinol (ZYLOPRIM) 100 MG tablet TAKE 2 TABLETS (200 MG TOTAL) BY MOUTH DAILY. 180 tablet 3   aspirin 81 MG EC tablet Take 81 mg by mouth daily.     chlorpheniramine-HYDROcodone (TUSSIONEX) 10-8 MG/5ML Take 5 mLs by mouth every 12 hours as needed for cough. 115 mL 0   Cholecalciferol (VITAMIN D-1000 MAX ST) 25 MCG (1000 UT) tablet Take by mouth.     dapagliflozin propanediol (FARXIGA) 5 MG TABS tablet Take 1 tablet (5 mg total) by mouth daily before breakfast. 90 tablet 3   doxycycline (VIBRAMYCIN) 100 MG capsule Two pills daily for 14 days as needed for flares. Take w large glass of water. Don't lay down for 30 mins after taking.     ezetimibe (ZETIA) 10 MG tablet Take 1 tablet (10 mg total) by mouth daily. 90 tablet 3   fexofenadine (ALLEGRA) 180 MG tablet Take 180 mg by mouth daily.     gabapentin (NEURONTIN) 100 MG capsule Take 1 capsule (100 mg total) by mouth 2 (two) times daily as needed. 180 capsule 2   gabapentin (NEURONTIN) 100 MG capsule Take 1 capsule (100 mg total) by mouth 2 (two) times daily. 60 capsule 1   levothyroxine (SYNTHROID) 137 MCG tablet TAKE 1 TABLET BY MOUTH DAILY BEFORE BREAKFAST. 90 tablet 3   losartan (COZAAR) 25 MG tablet Take 1/2 tablet (12.5 mg total) by mouth daily. 45 tablet 1   metroNIDAZOLE (METROCREAM) 0.75 % cream Apply topically.     metroNIDAZOLE (METROGEL) 0.75 % vaginal gel Insert 1 applicatorful at bedtime once a day at bedtime for 5 days. 70 g 0    pantoprazole (PROTONIX) 40 MG tablet Take 1 tablet (40 mg total) by mouth daily. 90 tablet 3   potassium chloride SA (KLOR-CON M) 20 MEQ tablet Take 1 tablet (20 mEq total) by mouth daily. OK to take extra on days you take extra torsemide. 180 tablet 2   Semaglutide, 2 MG/DOSE, 8 MG/3ML SOPN Inject 2 mg into the skin once a week. 9 mL 3   SYMBICORT  160-4.5 MCG/ACT inhaler Inhale 2 puffs into the lungs in the morning and at bedtime. 10.2 g 11   torsemide (DEMADEX) 20 MG tablet Take 3 tablets (60 mg total) by mouth daily. 270 tablet 1   valACYclovir (VALTREX) 1000 MG tablet Take by mouth.     No current facility-administered medications on file prior to visit.        ROS:  All others reviewed and negative.  Objective        PE:  BP 124/72 (BP Location: Right Arm, Patient Position: Sitting, Cuff Size: Normal)   Pulse 73   Temp 98.9 F (37.2 C) (Oral)   Ht 5\' 4"  (1.626 m)   Wt 211 lb (95.7 kg)   SpO2 99%   BMI 36.22 kg/m                 Constitutional: Pt appears in NAD               HENT: Head: NCAT.                Right Ear: External ear normal.                 Left Ear: External ear normal.                Eyes: . Pupils are equal, round, and reactive to light. Conjunctivae and EOM are normal               Nose: without d/c or deformity               Neck: Neck supple. Gross normal ROM               Cardiovascular: Normal rate and regular rhythm.                 Pulmonary/Chest: Effort normal and breath sounds without rales or wheezing.                Abd:  Soft, NT, ND, + BS, no organomegaly               Neurological: Pt is alert. At baseline orientation, motor grossly intact               Skin: Skin is warm. No rashes, no other new lesions, LE edema - none               Psychiatric: Pt behavior is normal without agitation   Micro: none  Cardiac tracings I have personally interpreted today:  none  Pertinent Radiological findings (summarize): none   Lab Results  Component  Value Date   WBC 12.1 (H) 10/19/2022   HGB 13.4 10/19/2022   HCT 41.2 10/19/2022   PLT 281.0 10/19/2022   GLUCOSE 84 03/28/2023   CHOL 143 03/28/2023   TRIG 68.0 03/28/2023   HDL 68.10 03/28/2023   LDLCALC 61 03/28/2023   ALT 18 03/28/2023   AST 16 03/28/2023   NA 142 03/28/2023   K 3.9 03/28/2023   CL 110 03/28/2023   CREATININE 1.18 03/28/2023   BUN 12 03/28/2023   CO2 24 03/28/2023   TSH 2.09 10/19/2022   INR 1.0 03/21/2017   HGBA1C 5.2 03/28/2023   MICROALBUR 0.8 10/19/2022   Assessment/Plan:  Chau Kotara is a 67 y.o. Black or African American [2] female with  has a past medical history of ALLERGIC RHINITIS (05/09/2008), Allergy, ANXIETY (05/23/2007), ARM PAIN, LEFT (10/23/2009), Arthritis, ASTHMA (05/23/2007), Back pain, Cardiomyopathy (HCC) (09/08/2015),  Cataract, Chest pain, CHF (congestive heart failure) (HCC), Chronic diastolic heart failure (HCC) (16/07/9603), Constipation, CONTACT DERMATITIS (06/09/2009), Diabetes mellitus without complication (HCC), Dyspnea, ENDOMETRIOSIS NOS (05/23/2007), FACIAL PAIN (06/02/2010), Food allergy, FREQUENCY, URINARY (05/17/2010), GERD (gastroesophageal reflux disease), GLUCOSE INTOLERANCE (08/28/2009), HERPES SIMPLEX, UNCOMPLICATED (05/23/2007), Hyperlipidemia (08/28/2015), HYPERTHYROIDISM (05/23/2007), Hypoactive thyroid, HYPOTHYROIDISM (05/09/2008), Impaired glucose tolerance (01/28/2011), INSOMNIA, HX OF (05/23/2007), Joint pain, LATERAL EPICONDYLITIS, RIGHT (03/10/2009), LIPOMA (10/22/2010), NECK PAIN (11/10/2010), OBESITY (05/23/2007), Osteopenia (09/28/2018), Plantar fasciitis, Pre-diabetes, SHOULDER PAIN, LEFT (05/17/2010), SINUSITIS- ACUTE-NOS (11/03/2008), SKIN LESION (11/10/2010), Sleep apnea, Stage III chronic kidney disease (HCC), Unspecified Chest Pain (07/25/2008), UNSPECIFIED URTICARIA (06/04/2009), URI (08/28/2009), UTI (04/29/2008), VITAMIN D DEFICIENCY (08/28/2009), and Wheezing (07/12/2010).  Vitamin D deficiency Last  vitamin D Lab Results  Component Value Date   VD25OH 31.91 03/28/2023   Low to start oral replacement   Diabetes [facility]) Lab Results  Component Value Date   HGBA1C 5.2 03/28/2023   Stable, pt to continue current medical treatment farxoga 5 every day. Ozempic 2 mg weekly   Hyperlipidemia Lab Results  Component Value Date   LDLCALC 61 03/28/2023   Stable, pt to continue current statin zetia 10 mg qd   CKD (chronic kidney disease) Lab Results  Component Value Date   CREATININE 1.18 03/28/2023   Stable overall, cont to avoid nephrotoxins, continue farxiga 5 mg every day, also decreased torsemide to 20 every day per renal  B12 deficiency Lab Results  Component Value Date   VITAMINB12 296 03/28/2023   Low, to start oral replacement - b12 1000 mcg qd   Followup: Return in about 6 months (around 09/28/2023).  Oliver Barre, MD 04/01/2023 5:29 PM [facility]

## 2023-03-29 NOTE — Patient Instructions (Signed)
Please take OTC Vitamin D3 at 2000 units per day, indefinitely  Please continue all other medications as before, and refills have been done if requested.  Please have the pharmacy call with any other refills you may need.  Please continue your efforts at being more active, low cholesterol diet, and weight control  Please keep your appointments with your specialists as you may have planned - Dr Glenna Fellows  Please make an Appointment to return in 6 months, or sooner if needed, also with Lab Appointment for testing done 3-5 days before at the FIRST FLOOR Lab (so this is for TWO appointments - please see the scheduling desk as you leave)

## 2023-04-01 ENCOUNTER — Encounter: Payer: Self-pay | Admitting: Internal Medicine

## 2023-04-01 DIAGNOSIS — N189 Chronic kidney disease, unspecified: Secondary | ICD-10-CM | POA: Insufficient documentation

## 2023-04-01 NOTE — Assessment & Plan Note (Signed)
Lab Results  Component Value Date   LDLCALC 61 03/28/2023   Stable, pt to continue current statin zetia 10 mg qd

## 2023-04-01 NOTE — Assessment & Plan Note (Signed)
Lab Results  Component Value Date   VITAMINB12 296 03/28/2023   Low, to start oral replacement - b12 1000 mcg qd

## 2023-04-01 NOTE — Assessment & Plan Note (Signed)
Lab Results  Component Value Date   HGBA1C 5.2 03/28/2023   Stable, pt to continue current medical treatment farxoga 5 every day. Ozempic 2 mg weekly

## 2023-04-01 NOTE — Assessment & Plan Note (Signed)
Last vitamin D Lab Results  Component Value Date   VD25OH 31.91 03/28/2023   Low to start oral replacement

## 2023-04-01 NOTE — Assessment & Plan Note (Addendum)
Lab Results  Component Value Date   CREATININE 1.18 03/28/2023   Stable overall, cont to avoid nephrotoxins, continue farxiga 5 mg every day, also decreased torsemide to 20 every day per renal

## 2023-04-18 ENCOUNTER — Other Ambulatory Visit (HOSPITAL_COMMUNITY): Payer: Self-pay

## 2023-04-18 ENCOUNTER — Other Ambulatory Visit: Payer: Self-pay | Admitting: Internal Medicine

## 2023-04-18 MED ORDER — ALBUTEROL SULFATE HFA 108 (90 BASE) MCG/ACT IN AERS
2.0000 | INHALATION_SPRAY | RESPIRATORY_TRACT | 11 refills | Status: DC | PRN
  Filled 2023-04-18: qty 6.7, 30d supply, fill #0
  Filled 2023-04-26: qty 6.7, 17d supply, fill #0
  Filled 2023-05-20: qty 6.7, 17d supply, fill #1
  Filled 2023-07-17: qty 6.7, 17d supply, fill #2
  Filled 2023-09-08: qty 6.7, 17d supply, fill #3
  Filled 2023-10-09: qty 6.7, 17d supply, fill #4
  Filled 2023-12-27: qty 6.7, 17d supply, fill #5
  Filled 2024-02-09: qty 6.7, 17d supply, fill #6
  Filled 2024-03-01 – 2024-03-05 (×2): qty 6.7, 17d supply, fill #7

## 2023-04-26 ENCOUNTER — Other Ambulatory Visit (HOSPITAL_COMMUNITY): Payer: Self-pay

## 2023-05-03 ENCOUNTER — Other Ambulatory Visit (HOSPITAL_COMMUNITY): Payer: Self-pay

## 2023-05-17 NOTE — Progress Notes (Deleted)
PATIENT: Suzanne Hansen DOB: 05-26-1956  REASON FOR VISIT: follow up HISTORY FROM: patient  No chief complaint on file.   HISTORY OF PRESENT ILLNESS:  05/18/2023 ALL: Suzanne Hansen returns for follow up for OSA on CPAP.   05/19/2022 ALL: Suzanne Hansen returns for follow up for OSA on CPAP. She reports doing well on therapy. She admits that she has not been as compliant as in the past. She is using CPAP most nights for about 4 hours. She does note significant improvement in sleep quality if she doesn't use her machine. She wakes tired and with a sore throat. She does mention occasional episodes of coughing when she first starts therapy but once she adjusts her nasal pillow she is fine. Memory is "good". She feels that changing jobs has helped reduce stress levels.     05/13/2021 ALL: Suzanne Hansen returns for follow up for OSA on CPAP. She reports doing better on CPAP since reduced pressure settings. She is sleeping fairly well but admits that sleep schedule varies. She likes to read and can stay up til 3am. She naps when she gets home and then can't sleep at night. She feels that memory is the same. She remains employed full time and does well with performance evaluations.     05/13/20 CD: Suzanne Hansen is a 67 y.o. female here today for follow up for OSA on CPAP.  Epworth sleepiness scale: 6 / 24 , FSS at 28/63  Points.   Poor compliance with CPAP- due to insomnia - "I am up at 3 AM and sometimes that's the end of the night" And " When I read in bed I will always fall asleep- and I forget to put the CPAP on many evenings".   Spine fusion in December has reduced the pain. Dr Wynetta Emery. CPAP supplies not longer paid for by insurance. [employer]. She is now Information systems manager. Nasal ;pillow replacement.  05/13/2019 ALL:  Suzanne Hansen is a 68 y.o. female here today for follow up for OSA on CPAP.  She returns today for initial compliance report.  She is doing very well and notes improvement in energy and less  fatigue during the day.  Compliance report dated 04/09/2019 through 05/08/2019 reveals that she is using CPAP every day for compliance of 100%.  28 out of the last 30 days she used her machine greater than 4 hours for compliance of 93%.  AHI was 2.8 on 16 cm of water and EPR of 3.  There was no significant leak.  She is doing very well today and without concerns.  HISTORY: (copied from Dr Dohmeier's note on 09/18/2018)  Suzanne Hansen is a 67 y.o. female , she was originally seen in May 2017 seen here as a referral from Dentist DR Helmut Muster, DMD at [facility] and [address].  I have the pleasure of seeing Suzanne Hansen today in a revisit, Almost 3 years after his encounter at her last.  Today is 18 September 2018 and Suzanne Hansen reports that her cardiologist Dr. Stephenie Acres P. Sami, DO at [facility] in [place] wants her to be reevaluated for sleep apnea.  The patient had been evaluated in an attended split-night polysomnography on 29 Feb 2016 at the time she had a baseline AHI of 17.3 mostly hypercapnia related, REM AHI was 52.6 she did not have oxygen desaturations of significance.  She was titrated to CPAP at 9 cmH2O the AHI became 0.0 at that setting and she tolerated supine positional sleep without further apneas,  she also had a significant amount of REM sleep rebounding.   The patient started CPAP late May 2017 at home but she contracted frequent upper respiratory infections, sinusitis and she suffers from a easily congested nose and sinus symptoms.  She felt that CPAP was not comfortable for her to wear and I had never seen her after the trial of CPAP and she decided to go with a dental appliance instead.  Her daughter now tells her that the dental appliance has not eliminated her snoring, as she could hear her mother snore.  Her heart failure specialist is also concerned that the dental appliance does nothing to treat the underlying REM dependent apnea, which is true.  Her family has noticed that she  continues to have apneas while using the mouthpiece. The mouth piece cannot be adjusted ay further.    I have also the opportunity to read her cardiologist notes, Dr [name], according to the visit notes Suzanne Hansen is described as a 67 year old female with obesity, with prior mild systolic heart failure but now normal ejection fraction after a right heart catheterization, paroxysmal atrial fibrillation.  Longstanding dyspnea and chest pain she has been on diuretics for several years.  Left ejection fraction had been 40 to 50% since 2014 or longer she is intolerant of lisinopril to the cough, she is not on anticoagulation for paroxysmal atrial fibrillation.  She is a borderline diabetic she is on statin for high lipids, she uses a mouth appliance for sleep apnea.  She was seen in November 2018 for chest pain had a negative stress test was given Lasix in the ED and had not taken her diuretics at home that day.  She received a steroid injection for hip pain, she had no improvement after a nerve ablation but finally found some improvement after the cyst was removed from the lumbar spine, she has an appointment with nephrology based on her cardiology referral.  Her white blood cell count had been 11.3 hemoglobin 12.4 hematocrit 35.9, AST 24, AST 20 sodium 139 potassium 3.8 in August 2019, creatinine was 1.75 which is elevated on May 17, 2018.  BUN was high at 29.   Nephrology visit at [facility] was in 3rd October 2019, the patient was started on allopurinol.      Suzanne Hansen presents with Asthma, recurrent bronchitis, sinusitis,  hypothyroidism and weight gain ( BMI is now 41), and a  diagnosis of cardiomyopathy. Her cardiomyopathy progressed into heart failure, CKD stage 3 , and she developed leg edema. She knows she snores, because her throat is sore and her mouth is dry in AM, her daughters tell her she stops breathing and snores loudly.    Sleep habits are as follows: She usually returns home after work feeling  almost ready to go to bed. She will be on her comfort close by 7 or 8 PM average bedtime is around 10 PM. She sleeps on one pillow, does not elevate the head of bed. She falls asleep on her side, but she wakes up she is often on her back. She usually falls asleep easily and she feels that she sleeps through the night some nights, others she will just wake up for no reason.  Sometimes she will gasp for air or seemingly snore herself awake,( mostly when falling asleep in a chair). She will have three  bathroom breaks at night on average, rarely 4-5 . Wakes up spontaneously at about 6.00 AM but she needs to be up by 7.AM.  No  naps in daytime- work is busy. She may doze when not physically active or mentally stimulated and she does often feel sleepier when she drives, helping herself to some caffeine on the way.   Sleep and additional medical history :As a child she used to sleep with a night light on, he sometimes eating a big light. She had some nightmares the usual pre-school or fear of monsters. She has no history of sleepwalking so of true night terrors , but of enuresis.  She had a cyst removed from her spine 06-2017, and since her left leg bothers her "not as much".   Social history: She is divorced after 25 years of marriage, sleeps alone with her dog.  Adult  daughters, one married, one a Chief Financial Officer, and the youngest one finishes her masters in education.  The youngest lives with her.  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 Caffeine - one cup in AM and rarely uses caffeine -  ETOH rare, less than one a week.  Tobacco use- in college  Sept 1977, and stopped with her first pregnancy - 5 cig a day.   REVIEW OF SYSTEMS: Out of a complete 14 system review of symptoms, the patient complains only of the following symptoms, memory loss, headache and all other reviewed systems are negative.  Epworth sleepiness scale: 9   ALLERGIES: Allergies  Allergen  Reactions   Shellfish Allergy Anaphylaxis   Clarithromycin Nausea And Vomiting   Latex Itching and Other (See Comments)    ITCHING AND COLD SORES AROUND MOUTH WHEN LATEX GLOVES USED BY THE DENTIST/HYGENTIST   Metronidazole Nausea And Vomiting   Zostavax [Zoster Vaccine Live]     Arm swelled   Adhesive [Tape] Dermatitis    Plastic tape    Amiloride     foot numbness   Clindamycin Nausea Only and Nausea And Vomiting   Gadobenate     Pt has had some form of dye without issues but isnt sure if she is allergic to this specific agent    Lisinopril Cough    HOME MEDICATIONS: Outpatient Medications Prior to Visit  Medication Sig Dispense Refill   acetaminophen (TYLENOL) 650 MG CR tablet Take by mouth.     albuterol (VENTOLIN HFA) 108 (90 Base) MCG/ACT inhaler Inhale 2 puffs into the lungs every 4 (four) hours as needed. 6.7 g 11   allopurinol (ZYLOPRIM) 100 MG tablet TAKE 2 TABLETS (200 MG TOTAL) BY MOUTH DAILY. 180 tablet 3   aspirin 81 MG EC tablet Take 81 mg by mouth daily.     chlorpheniramine-HYDROcodone (TUSSIONEX) 10-8 MG/5ML Take 5 mLs by mouth every 12 hours as needed for cough. 115 mL 0   Cholecalciferol (VITAMIN D-1000 MAX ST) 25 MCG (1000 UT) tablet Take by mouth.     dapagliflozin propanediol (FARXIGA) 5 MG TABS tablet Take 1 tablet (5 mg total) by mouth daily before breakfast. 90 tablet 3   doxycycline (VIBRAMYCIN) 100 MG capsule Two pills daily for 14 days as needed for flares. Take w large glass of water. Don't lay down for 30 mins after taking.     ezetimibe (ZETIA) 10 MG tablet Take 1 tablet (10 mg total) by mouth daily. 90 tablet 3   fexofenadine (ALLEGRA) 180 MG tablet Take 180 mg by mouth daily.     gabapentin (NEURONTIN) 100 MG capsule Take 1 capsule (100 mg total) by mouth 2 (two) times daily as needed. 180 capsule 2   gabapentin (  NEURONTIN) 100 MG capsule Take 1 capsule (100 mg total) by mouth 2 (two) times daily. 60 capsule 1   levothyroxine (SYNTHROID) 137 MCG  tablet TAKE 1 TABLET BY MOUTH DAILY BEFORE BREAKFAST. 90 tablet 3   losartan (COZAAR) 25 MG tablet Take 1/2 tablet (12.5 mg total) by mouth daily. 45 tablet 1   metroNIDAZOLE (METROCREAM) 0.75 % cream Apply topically.     metroNIDAZOLE (METROGEL) 0.75 % vaginal gel Insert 1 applicatorful at bedtime once a day at bedtime for 5 days. 70 g 0   pantoprazole (PROTONIX) 40 MG tablet Take 1 tablet (40 mg total) by mouth daily. 90 tablet 3   potassium chloride SA (KLOR-CON M) 20 MEQ tablet Take 1 tablet (20 mEq total) by mouth daily. OK to take extra on days you take extra torsemide. 180 tablet 2   Semaglutide, 2 MG/DOSE, 8 MG/3ML SOPN Inject 2 mg into the skin once a week. 9 mL 3   SYMBICORT 160-4.5 MCG/ACT inhaler Inhale 2 puffs into the lungs in the morning and at bedtime. 10.2 g 11   torsemide (DEMADEX) 20 MG tablet Take 3 tablets (60 mg total) by mouth daily. 270 tablet 1   valACYclovir (VALTREX) 1000 MG tablet Take by mouth.     No facility-administered medications prior to visit.    PAST MEDICAL HISTORY: Past Medical History:  Diagnosis Date   ALLERGIC RHINITIS 05/09/2008   Allergy    ANXIETY 05/23/2007   ARM PAIN, LEFT 10/23/2009   Arthritis    ASTHMA 05/23/2007   Back pain    Cardiomyopathy (HCC) 09/08/2015   Cataract    Chest pain    CHF (congestive heart failure) (HCC)    Chronic diastolic heart failure (HCC) 01/22/2020   Constipation    CONTACT DERMATITIS 06/09/2009   Diabetes mellitus without complication (HCC)    type II   Dyspnea    allergies-dust, cig smoke, rag weeds   ENDOMETRIOSIS NOS 05/23/2007   FACIAL PAIN 06/02/2010   Food allergy    FREQUENCY, URINARY 05/17/2010   GERD (gastroesophageal reflux disease)    GLUCOSE INTOLERANCE 08/28/2009   HERPES SIMPLEX, UNCOMPLICATED 05/23/2007   Hyperlipidemia 08/28/2015   HYPERTHYROIDISM 05/23/2007   Hypoactive thyroid    HYPOTHYROIDISM 05/09/2008   Impaired glucose tolerance 01/28/2011   INSOMNIA, HX OF 05/23/2007    Joint pain    LATERAL EPICONDYLITIS, RIGHT 03/10/2009   LIPOMA 10/22/2010   NECK PAIN 11/10/2010   OBESITY 05/23/2007   Osteopenia 09/28/2018   Plantar fasciitis    Both feet   Pre-diabetes    SHOULDER PAIN, LEFT 05/17/2010   SINUSITIS- ACUTE-NOS 11/03/2008   SKIN LESION 11/10/2010   Sleep apnea    Stage III chronic kidney disease (HCC)    Stage III   Unspecified Chest Pain 07/25/2008   UNSPECIFIED URTICARIA 06/04/2009   URI 08/28/2009   UTI 04/29/2008   VITAMIN D DEFICIENCY 08/28/2009   Wheezing 07/12/2010    PAST SURGICAL HISTORY: Past Surgical History:  Procedure Laterality Date   COLONOSCOPY     COLONOSCOPY WITH PROPOFOL N/A 04/25/2022   Procedure: COLONOSCOPY WITH PROPOFOL;  Surgeon: Napoleon Form, MD;  Location: [facility];  Service: Gastroenterology;  Laterality: N/A;   ENDOMETRIAL ABLATION     LAMINECTOMY  1996   LAPAROTOMY     exploratory   lower back surgery  07/06/2017   cyst   POLYPECTOMY     POLYPECTOMY  04/25/2022   Procedure: POLYPECTOMY;  Surgeon: Napoleon Form, MD;  Location: [facility];  Service: Gastroenterology;;   s/p endometrial ablation  12/06   TUBAL LIGATION     UTERINE FIBROID SURGERY     WISDOM TOOTH EXTRACTION      FAMILY HISTORY: Family History  Problem Relation Age of Onset   Heart disease Mother        Rheumatic heart disease   Depression Mother    COPD Father    Hypertension Father    Diabetes Sister    Kidney disease Sister    Cancer Maternal Grandmother        Breast   Diabetes Cousin    Hypertension Cousin    Heart disease Cousin    Colon cancer Neg Hx    Esophageal cancer Neg Hx    Rectal cancer Neg Hx    Stomach cancer Neg Hx     SOCIAL HISTORY: Social History   Socioeconomic History   Marital status: Single    Spouse name: Not on file   Number of children: 3   Years of education: Not on file   Highest education level: Not on file  Occupational History   Occupation: SOCIAL WORKER     Employer: [employer]    Comment: [place]   Occupation: Advertising account executive for preschool infants  Tobacco Use   Smoking status: Former    Current packs/day: 0.00    Average packs/day: 0.5 packs/day for 12.0 years (6.0 ttl pk-yrs)    Types: Cigarettes    Start date: 11/05/1975    Quit date: 11/05/1987    Years since quitting: 35.5   Smokeless tobacco: Never  Vaping Use   Vaping status: Never Used  Substance and Sexual Activity   Alcohol use: Yes    Alcohol/week: 1.0 standard drink of alcohol    Types: 1 Glasses of wine per week    Comment: WINE   Drug use: No   Sexual activity: Not on file  Other Topics Concern   Not on file  Social History Narrative   Not on file   Social Determinants of Health   Financial Resource Strain: Not on file  Food Insecurity: No Food Insecurity (07/30/2021)   Received from [facility], [facility]   Hunger Vital Sign    Worried About Running Out of Food in the Last Year: Never true    Ran Out of Food in the Last Year: Never true  Transportation Needs: Not on file  Physical Activity: Not on file  Stress: Not on file  Social Connections: Unknown (02/15/2022)   Received from [facility], [facility]   Social Network    Social Network: Not on file  Intimate Partner Violence: Unknown (01/13/2022)   Received from [facility], [facility]   HITS    Physically Hurt: Not on file    Insult or Talk Down To: Not on file    Threaten Physical Harm: Not on file    Scream or Curse: Not on file      PHYSICAL EXAM  There were no vitals filed for this visit.    There is no height or weight on file to calculate BMI.  Generalized: Well developed, in no acute distress  Cardiology: normal rate and rhythm, no murmur noted Neurological examination  Mentation: Alert oriented to time, place, history taking. Follows all commands speech and language fluent Cranial nerve II-XII: Pupils were equal round reactive to light. Extraocular  movements were full, visual field were full on confrontational test. Facial sensation and strength were normal. Uvula tongue  midline. Head turning and shoulder shrug  were normal and symmetric. Motor: The motor testing reveals 5 over 5 strength of all 4 extremities. Good symmetric motor tone is noted throughout.  Coordination: Cerebellar testing reveals good finger-nose-finger and heel-to-shin bilaterally.  Gait and station: Gait is normal.   DIAGNOSTIC DATA (LABS, IMAGING, TESTING) - I reviewed patient records, labs, notes, testing and imaging myself where available.      No data to display           Lab Results  Component Value Date   WBC 12.1 (H) 10/19/2022   HGB 13.4 10/19/2022   HCT 41.2 10/19/2022   MCV 90.5 10/19/2022   PLT 281.0 10/19/2022      Component Value Date/Time   NA 142 03/28/2023 1358   NA 142 06/28/2022 1500   K 3.9 03/28/2023 1358   CL 110 03/28/2023 1358   CO2 24 03/28/2023 1358   GLUCOSE 84 03/28/2023 1358   BUN 12 03/28/2023 1358   BUN 20 06/28/2022 1500   CREATININE 1.18 03/28/2023 1358   CREATININE 0.97 09/17/2015 1113   CALCIUM 9.9 03/28/2023 1358   PROT 6.9 03/28/2023 1358   PROT 6.5 10/20/2021 1001   ALBUMIN 4.1 03/28/2023 1358   ALBUMIN 4.5 10/20/2021 1001   AST 16 03/28/2023 1358   ALT 18 03/28/2023 1358   ALKPHOS 77 03/28/2023 1358   BILITOT 0.6 03/28/2023 1358   BILITOT 0.5 10/20/2021 1001   GFRNONAA 38 (L) 09/03/2019 1335   GFRAA 44 (L) 09/03/2019 1335   Lab Results  Component Value Date   CHOL 143 03/28/2023   HDL 68.10 03/28/2023   LDLCALC 61 03/28/2023   TRIG 68.0 03/28/2023   CHOLHDL 2 03/28/2023   Lab Results  Component Value Date   HGBA1C 5.2 03/28/2023   Lab Results  Component Value Date   VITAMINB12 296 03/28/2023   Lab Results  Component Value Date   TSH 2.09 10/19/2022    ASSESSMENT AND PLAN 67 y.o. year old female  has a past medical history of ALLERGIC RHINITIS (05/09/2008), Allergy, ANXIETY  (05/23/2007), ARM PAIN, LEFT (10/23/2009), Arthritis, ASTHMA (05/23/2007), Back pain, Cardiomyopathy (HCC) (09/08/2015), Cataract, Chest pain, CHF (congestive heart failure) (HCC), Chronic diastolic heart failure (HCC) (62/13/0865), Constipation, CONTACT DERMATITIS (06/09/2009), Diabetes mellitus without complication (HCC), Dyspnea, ENDOMETRIOSIS NOS (05/23/2007), FACIAL PAIN (06/02/2010), Food allergy, FREQUENCY, URINARY (05/17/2010), GERD (gastroesophageal reflux disease), GLUCOSE INTOLERANCE (08/28/2009), HERPES SIMPLEX, UNCOMPLICATED (05/23/2007), Hyperlipidemia (08/28/2015), HYPERTHYROIDISM (05/23/2007), Hypoactive thyroid, HYPOTHYROIDISM (05/09/2008), Impaired glucose tolerance (01/28/2011), INSOMNIA, HX OF (05/23/2007), Joint pain, LATERAL EPICONDYLITIS, RIGHT (03/10/2009), LIPOMA (10/22/2010), NECK PAIN (11/10/2010), OBESITY (05/23/2007), Osteopenia (09/28/2018), Plantar fasciitis, Pre-diabetes, SHOULDER PAIN, LEFT (05/17/2010), SINUSITIS- ACUTE-NOS (11/03/2008), SKIN LESION (11/10/2010), Sleep apnea, Stage III chronic kidney disease (HCC), Unspecified Chest Pain (07/25/2008), UNSPECIFIED URTICARIA (06/04/2009), URI (08/28/2009), UTI (04/29/2008), VITAMIN D DEFICIENCY (08/28/2009), and Wheezing (07/12/2010). here with   No diagnosis found.     Ms. Hansen is doing very well with CPAP therapy.  She has noted significant improvement in her energy and less daytime sleepiness.  Compliance report reveals acceptable daily but sub optimal 4 hour compliance.  She was encouraged to continue using CPAP nightly and for greater than 4 hours each night. She will use memory compensation strategies. She will follow-up annually, sooner if needed.  She verbalizes understanding and agreement with this plan.   No orders of the defined types were placed in this encounter.     No orders of the defined types were placed in this encounter.  Shawnie Dapper, FNP-C 05/17/2023, 10:11 AM [facility] [address], [address] [place], [place] [address] [phone]

## 2023-05-18 ENCOUNTER — Ambulatory Visit: Payer: BC Managed Care – PPO | Admitting: Family Medicine

## 2023-05-18 DIAGNOSIS — G4733 Obstructive sleep apnea (adult) (pediatric): Secondary | ICD-10-CM

## 2023-05-20 ENCOUNTER — Other Ambulatory Visit: Payer: Self-pay | Admitting: Internal Medicine

## 2023-05-20 ENCOUNTER — Other Ambulatory Visit: Payer: Self-pay | Admitting: Cardiovascular Disease

## 2023-05-22 ENCOUNTER — Other Ambulatory Visit (HOSPITAL_COMMUNITY): Payer: Self-pay

## 2023-05-22 ENCOUNTER — Other Ambulatory Visit: Payer: Self-pay

## 2023-05-22 MED ORDER — LOSARTAN POTASSIUM 25 MG PO TABS
12.5000 mg | ORAL_TABLET | Freq: Every day | ORAL | 0 refills | Status: DC
  Filled 2023-05-22: qty 45, 90d supply, fill #0

## 2023-05-22 MED ORDER — ALLOPURINOL 100 MG PO TABS
200.0000 mg | ORAL_TABLET | Freq: Every day | ORAL | 3 refills | Status: DC
  Filled 2023-05-22: qty 180, 90d supply, fill #0
  Filled 2023-08-19 – 2023-10-25 (×2): qty 180, 90d supply, fill #1
  Filled 2024-02-21: qty 180, 90d supply, fill #2
  Filled 2024-05-17: qty 180, 90d supply, fill #3

## 2023-05-23 ENCOUNTER — Other Ambulatory Visit (HOSPITAL_COMMUNITY): Payer: Self-pay

## 2023-05-27 ENCOUNTER — Other Ambulatory Visit (HOSPITAL_COMMUNITY): Payer: Self-pay

## 2023-05-27 ENCOUNTER — Other Ambulatory Visit: Payer: Self-pay

## 2023-05-29 ENCOUNTER — Other Ambulatory Visit (HOSPITAL_COMMUNITY): Payer: Self-pay

## 2023-05-30 ENCOUNTER — Other Ambulatory Visit (HOSPITAL_COMMUNITY): Payer: Self-pay

## 2023-05-31 ENCOUNTER — Other Ambulatory Visit (HOSPITAL_COMMUNITY): Payer: Self-pay

## 2023-06-01 ENCOUNTER — Other Ambulatory Visit (HOSPITAL_COMMUNITY): Payer: Self-pay

## 2023-06-20 ENCOUNTER — Ambulatory Visit: Payer: Medicare Other | Admitting: Neurology

## 2023-07-17 ENCOUNTER — Other Ambulatory Visit (HOSPITAL_COMMUNITY): Payer: Self-pay

## 2023-07-19 ENCOUNTER — Other Ambulatory Visit (HOSPITAL_COMMUNITY): Payer: Self-pay

## 2023-07-21 ENCOUNTER — Other Ambulatory Visit (HOSPITAL_COMMUNITY): Payer: Self-pay

## 2023-08-19 ENCOUNTER — Other Ambulatory Visit (HOSPITAL_COMMUNITY): Payer: Self-pay

## 2023-08-19 ENCOUNTER — Other Ambulatory Visit: Payer: Self-pay | Admitting: Cardiovascular Disease

## 2023-08-19 ENCOUNTER — Other Ambulatory Visit: Payer: Self-pay | Admitting: Physician Assistant

## 2023-08-19 ENCOUNTER — Other Ambulatory Visit: Payer: Self-pay | Admitting: Pulmonary Disease

## 2023-08-21 ENCOUNTER — Other Ambulatory Visit: Payer: Self-pay

## 2023-08-21 ENCOUNTER — Other Ambulatory Visit (HOSPITAL_COMMUNITY): Payer: Self-pay

## 2023-08-21 MED ORDER — PANTOPRAZOLE SODIUM 40 MG PO TBEC
40.0000 mg | DELAYED_RELEASE_TABLET | Freq: Every day | ORAL | 0 refills | Status: DC
  Filled 2023-08-21 – 2023-12-14 (×2): qty 90, 90d supply, fill #0

## 2023-08-21 MED ORDER — LOSARTAN POTASSIUM 25 MG PO TABS
12.5000 mg | ORAL_TABLET | Freq: Every day | ORAL | 0 refills | Status: DC
  Filled 2023-08-21 – 2023-10-25 (×2): qty 45, 90d supply, fill #0
  Filled 2024-02-21: qty 45, 90d supply, fill #1

## 2023-08-23 ENCOUNTER — Other Ambulatory Visit (HOSPITAL_COMMUNITY): Payer: Self-pay

## 2023-08-23 MED ORDER — SYMBICORT 160-4.5 MCG/ACT IN AERO
2.0000 | INHALATION_SPRAY | Freq: Two times a day (BID) | RESPIRATORY_TRACT | 0 refills | Status: DC
  Filled 2023-08-23 – 2023-09-08 (×2): qty 10.2, 30d supply, fill #0

## 2023-08-24 ENCOUNTER — Ambulatory Visit: Payer: No Typology Code available for payment source | Admitting: Neurology

## 2023-08-24 ENCOUNTER — Encounter: Payer: Self-pay | Admitting: Neurology

## 2023-08-24 VITALS — BP 135/79 | HR 92 | Ht 63.0 in | Wt 203.0 lb

## 2023-08-24 DIAGNOSIS — N951 Menopausal and female climacteric states: Secondary | ICD-10-CM

## 2023-08-24 DIAGNOSIS — G4733 Obstructive sleep apnea (adult) (pediatric): Secondary | ICD-10-CM | POA: Diagnosis not present

## 2023-08-24 DIAGNOSIS — Z6841 Body Mass Index (BMI) 40.0 and over, adult: Secondary | ICD-10-CM | POA: Diagnosis not present

## 2023-08-24 NOTE — Addendum Note (Signed)
Addended by: Melvyn Novas on: 08/24/2023 01:58 PM   Modules accepted: Orders

## 2023-08-24 NOTE — Patient Instructions (Signed)
Healthy Living: Sleep In this video, you will learn why sleep is an important part of a healthy lifestyle. To view the content, go to this web address: [email or web address].[email or web address]  This video will expire on: 04/02/2025. If you need access to this video following this date, please reach out to the healthcare provider who assigned it to you. This information is not intended to replace advice given to you by your health care provider. Make sure you discuss any questions you have with your health care provider. Elsevier Patient Education  2024 [employer].

## 2023-08-24 NOTE — Progress Notes (Addendum)
Provider:  Melvyn Novas, MD  Primary Care Physician:  Corwin Levins, MD [address] [place] [address]     Referring Provider: Corwin Levins, Md [address] [place],  [place] [address]          Chief Complaint according to patient   Patient presents with:                HISTORY OF PRESENT ILLNESS:  Suzanne Hansen is a 67 y.o. female patient who is here for revisit 08/24/2023 for  a CPAP/ OSA compliance visit.   Chief concern according to patient :  " I just don't sleep well- but I am sleepier when I haven't used CPAP" .  Last HST was 02-18-2019 and the patient's insurance initially did not cover the CPAP costs, she used a loaner for a while. This current machine was covered in 2021.  She continues to struggle with some insomnia and how well she sleeps overall.  Has made changes to the density of the mattress and the material of the bed coverings that still not necessarily counting on a good night for every night.  Over the last 14 days her compliance has been excellent but the first half of October showed a compliance of less than 20% by hours.  The average use of time when the machine is used is about 6 hours at night she has an AutoSet is 10 with a minimum pressure of 5 and maximum pressure of 13 and the EPR of 3 cm water and her residual AHI is only 1.2/h so this is a good resolution of apnea.  The 95th percentile pressure is 8 cm water the 95th percentile air leak is 13.3 L/min.  Today's fatigue severity scale was endorsed at 20 out of 63 points and the Epworth sleepiness score was endorsed at 7 out of 24 points.  This is fairly simple similar to last years scores. She reports having fallen asleep during a lengthy phone call, while seated comfortably in her recliner.  She is on Gabapentin for back pain, paroxetine was tried for hot flushes and did keep her up. Has not tried sertraline , venlafaxine.    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   05/19/2022 ALL: Suzanne Hansen returns for follow up for OSA on CPAP. She reports doing well on therapy. She admits that she has not been as compliant as in the past. She is using CPAP most nights for about 4 hours. She does note significant improvement in sleep quality if she doesn't use her machine. She wakes tired and with a sore throat. She does mention occasional episodes of coughing when she first starts therapy but once she adjusts her nasal pillow she is fine. Memory is "good".  Review of Systems: Out of a complete 14 system review, the patient complains of only the following symptoms, and all other reviewed systems are negative.:  Fatigue, sleepiness , snoring, fragmented sleep, Insomnia, RLS, Nocturia    How likely are you to doze in the following situations: 0 = not likely, 1 = slight chance, 2 = moderate chance, 3 = high chance   Sitting and Reading? Watching Television? Sitting inactive in a public place (theater or meeting)? As a passenger in a car for an hour without a break? Lying down in the afternoon when circumstances permit? Sitting and talking to someone? Sitting  quietly after lunch without alcohol? In a car, while stopped for a few minutes in traffic?   Total = 7/ 24 points   FSS endorsed at 20/ 63 points.   Social History   Socioeconomic History   Marital status: Single    Spouse name: Not on file   Number of children: 3   Years of education: Not on file   Highest education level: Not on file  Occupational History   Occupation: SOCIAL WORKER    Employer: [employer]    Comment: [place]   Occupation: Advertising account executive for preschool infants  Tobacco Use   Smoking status: Former    Current packs/day: 0.00    Average packs/day: 0.5 packs/day for 12.0 years (6.0 ttl pk-yrs)    Types: Cigarettes    Start date: 11/05/1975    Quit date: 11/05/1987    Years since quitting: 35.8   Smokeless tobacco: Never   Vaping Use   Vaping status: Never Used  Substance and Sexual Activity   Alcohol use: Yes    Alcohol/week: 1.0 standard drink of alcohol    Types: 1 Glasses of wine per week    Comment: WINE   Drug use: No   Sexual activity: Not on file  Other Topics Concern   Not on file  Social History Narrative   Not on file   Social Determinants of Health   Financial Resource Strain: Not on file  Food Insecurity: No Food Insecurity (07/30/2021)   Received from [facility], [facility]   Hunger Vital Sign    Worried About Running Out of Food in the Last Year: Never true    Ran Out of Food in the Last Year: Never true  Transportation Needs: Not on file  Physical Activity: Not on file  Stress: Not on file  Social Connections: Unknown (02/15/2022)   Received from [facility], [facility]   Social Network    Social Network: Not on file    Family History  Problem Relation Age of Onset   Heart disease Mother        Rheumatic heart disease   Depression Mother    COPD Father    Hypertension Father    Diabetes Sister    Kidney disease Sister    Cancer Maternal Grandmother        Breast   Diabetes Cousin    Hypertension Cousin    Heart disease Cousin    Colon cancer Neg Hx    Esophageal cancer Neg Hx    Rectal cancer Neg Hx    Stomach cancer Neg Hx     Past Medical History:  Diagnosis Date   ALLERGIC RHINITIS 05/09/2008   Allergy    ANXIETY 05/23/2007   ARM PAIN, LEFT 10/23/2009   Arthritis    ASTHMA 05/23/2007   Back pain    Cardiomyopathy (HCC) 09/08/2015   Cataract    Chest pain    CHF (congestive heart failure) (HCC)    Chronic diastolic heart failure (HCC) 01/22/2020   Constipation    CONTACT DERMATITIS 06/09/2009   Diabetes mellitus without complication (HCC)    type II   Dyspnea    allergies-dust, cig smoke, rag weeds   ENDOMETRIOSIS NOS 05/23/2007   FACIAL PAIN 06/02/2010   Food allergy    FREQUENCY, URINARY 05/17/2010   GERD (gastroesophageal reflux  disease)    GLUCOSE INTOLERANCE 08/28/2009   HERPES SIMPLEX, UNCOMPLICATED 05/23/2007   Hyperlipidemia 08/28/2015   HYPERTHYROIDISM 05/23/2007   Hypoactive thyroid  HYPOTHYROIDISM 05/09/2008   Impaired glucose tolerance 01/28/2011   INSOMNIA, HX OF 05/23/2007   Joint pain    LATERAL EPICONDYLITIS, RIGHT 03/10/2009   LIPOMA 10/22/2010   NECK PAIN 11/10/2010   OBESITY 05/23/2007   Osteopenia 09/28/2018   Plantar fasciitis    Both feet   Pre-diabetes    SHOULDER PAIN, LEFT 05/17/2010   SINUSITIS- ACUTE-NOS 11/03/2008   SKIN LESION 11/10/2010   Sleep apnea    Stage III chronic kidney disease (HCC)    Stage III   Unspecified Chest Pain 07/25/2008   UNSPECIFIED URTICARIA 06/04/2009   URI 08/28/2009   UTI 04/29/2008   VITAMIN D DEFICIENCY 08/28/2009   Wheezing 07/12/2010    Past Surgical History:  Procedure Laterality Date   COLONOSCOPY     COLONOSCOPY WITH PROPOFOL N/A 04/25/2022   Procedure: COLONOSCOPY WITH PROPOFOL;  Surgeon: Napoleon Form, MD;  Location: [facility];  Service: Gastroenterology;  Laterality: N/A;   ENDOMETRIAL ABLATION     LAMINECTOMY  1996   LAPAROTOMY     exploratory   lower back surgery  07/06/2017   cyst   POLYPECTOMY     POLYPECTOMY  04/25/2022   Procedure: POLYPECTOMY;  Surgeon: Napoleon Form, MD;  Location: [facility];  Service: Gastroenterology;;   s/p endometrial ablation  12/06   TUBAL LIGATION     UTERINE FIBROID SURGERY     WISDOM TOOTH EXTRACTION       Current Outpatient Medications on File Prior to Visit  Medication Sig Dispense Refill   acetaminophen (TYLENOL) 650 MG CR tablet Take by mouth.     albuterol (VENTOLIN HFA) 108 (90 Base) MCG/ACT inhaler Inhale 2 puffs into the lungs every 4 (four) hours as needed. 6.7 g 11   allopurinol (ZYLOPRIM) 100 MG tablet Take 2 tablets (200 mg total) by mouth daily. 180 tablet 3   aspirin 81 MG EC tablet Take 81 mg by mouth daily.     chlorpheniramine-HYDROcodone (TUSSIONEX) 10-8  MG/5ML Take 5 mLs by mouth every 12 hours as needed for cough. 115 mL 0   Cholecalciferol (VITAMIN D-1000 MAX ST) 25 MCG (1000 UT) tablet Take by mouth.     dapagliflozin propanediol (FARXIGA) 5 MG TABS tablet Take 1 tablet (5 mg total) by mouth daily before breakfast. 90 tablet 3   doxycycline (VIBRAMYCIN) 100 MG capsule Two pills daily for 14 days as needed for flares. Take w large glass of water. Don't lay down for 30 mins after taking.     estradiol (ESTRACE) 0.1 MG/GM vaginal cream Place 1 Applicatorful vaginally once a week.     ezetimibe (ZETIA) 10 MG tablet Take 1 tablet (10 mg total) by mouth daily. 90 tablet 3   fexofenadine (ALLEGRA) 180 MG tablet Take 180 mg by mouth daily.     gabapentin (NEURONTIN) 100 MG capsule Take 1 capsule (100 mg total) by mouth 2 (two) times daily as needed. 180 capsule 2   gabapentin (NEURONTIN) 100 MG capsule Take 1 capsule (100 mg total) by mouth 2 (two) times daily. 60 capsule 1   levothyroxine (SYNTHROID) 137 MCG tablet TAKE 1 TABLET BY MOUTH DAILY BEFORE BREAKFAST. 90 tablet 3   losartan (COZAAR) 25 MG tablet Take 1/2 tablet by mouth daily. NEED APPOINTMENT 50 tablet 0   magnesium oxide (MAG-OX) 400 MG tablet Take 400 mg by mouth daily.     metroNIDAZOLE (METROCREAM) 0.75 % cream Apply topically.     metroNIDAZOLE (METROGEL) 0.75 % vaginal gel Insert 1 applicatorful  at bedtime once a day at bedtime for 5 days. 70 g 0   pantoprazole (PROTONIX) 40 MG tablet Take 1 tablet (40 mg total) by mouth daily. Patient needs follow up appointment for future refills. Please call [phone] to schedule an appointment. 90 tablet 0   potassium chloride SA (KLOR-CON M) 20 MEQ tablet Take 1 tablet (20 mEq total) by mouth daily. OK to take extra on days you take extra torsemide. 180 tablet 2   Semaglutide, 2 MG/DOSE, 8 MG/3ML SOPN Inject 2 mg into the skin once a week. 9 mL 3   SYMBICORT 160-4.5 MCG/ACT inhaler Inhale 2 puffs into the lungs in the morning and at bedtime.  10.2 g 0   tobramycin (TOBREX) 0.3 % ophthalmic solution Place 2 drops into both eyes every 2 (two) hours.     torsemide (DEMADEX) 20 MG tablet Take 3 tablets (60 mg total) by mouth daily. 270 tablet 1   valACYclovir (VALTREX) 1000 MG tablet Take by mouth.     No current facility-administered medications on file prior to visit.    Allergies  Allergen Reactions   Shellfish Allergy Anaphylaxis   Clarithromycin Nausea And Vomiting   Latex Itching and Other (See Comments)    ITCHING AND COLD SORES AROUND MOUTH WHEN LATEX GLOVES USED BY THE DENTIST/HYGENTIST   Metronidazole Nausea And Vomiting   Zostavax [Zoster Vaccine Live]     Arm swelled   Adhesive [Tape] Dermatitis    Plastic tape    Amiloride     foot numbness   Clindamycin Nausea Only and Nausea And Vomiting   Gadobenate     Pt has had some form of dye without issues but isnt sure if she is allergic to this specific agent    Lisinopril Cough     DIAGNOSTIC DATA (LABS, IMAGING, TESTING) - I reviewed patient records, labs, notes, testing and imaging myself where available.  Lab Results  Component Value Date   WBC 12.1 (H) 10/19/2022   HGB 13.4 10/19/2022   HCT 41.2 10/19/2022   MCV 90.5 10/19/2022   PLT 281.0 10/19/2022      Component Value Date/Time   NA 142 03/28/2023 1358   NA 142 06/28/2022 1500   K 3.9 03/28/2023 1358   CL 110 03/28/2023 1358   CO2 24 03/28/2023 1358   GLUCOSE 84 03/28/2023 1358   BUN 12 03/28/2023 1358   BUN 20 06/28/2022 1500   CREATININE 1.18 03/28/2023 1358   CREATININE 0.97 09/17/2015 1113   CALCIUM 9.9 03/28/2023 1358   PROT 6.9 03/28/2023 1358   PROT 6.5 10/20/2021 1001   ALBUMIN 4.1 03/28/2023 1358   ALBUMIN 4.5 10/20/2021 1001   AST 16 03/28/2023 1358   ALT 18 03/28/2023 1358   ALKPHOS 77 03/28/2023 1358   BILITOT 0.6 03/28/2023 1358   BILITOT 0.5 10/20/2021 1001   GFRNONAA 38 (L) 09/03/2019 1335   GFRAA 44 (L) 09/03/2019 1335   Lab Results  Component Value Date   CHOL  143 03/28/2023   HDL 68.10 03/28/2023   LDLCALC 61 03/28/2023   TRIG 68.0 03/28/2023   CHOLHDL 2 03/28/2023   Lab Results  Component Value Date   HGBA1C 5.2 03/28/2023   Lab Results  Component Value Date   VITAMINB12 296 03/28/2023   Lab Results  Component Value Date   TSH 2.09 10/19/2022    PHYSICAL EXAM:  Today's Vitals   08/24/23 1310  BP: 135/79  Pulse: 92  Weight: 203 lb (92.1 kg)  Height:  5\' 3"  (1.6 m)   Body mass index is 35.96 kg/m.   Wt Readings from Last 3 Encounters:  08/24/23 203 lb (92.1 kg)  03/29/23 211 lb (95.7 kg)  11/22/22 216 lb (98 kg)     Ht Readings from Last 3 Encounters:  08/24/23 5\' 3"  (1.6 m)  03/29/23 5\' 4"  (1.626 m)  11/22/22 5\' 4"  (1.626 m)      General:  The patient is awake, alert and appears not in acute distress. The patient is well groomed. Head: Normocephalic, atraumatic. Neck is supple. Mallampati 3 neck circumference: 16. Nasal airflow with congestion-, TMJ ; no click  evident . Retrognathia is seen.  Cardiovascular:  Regular rate and rhythm,  without  murmurs or carotid bruit, and without distended neck veins. Respiratory: Lungs are clear to auscultation Skin:  Without evidence of edema, or rash Trunk: BMI is elevated  The patient's posture is erect    Neurologic exam : The patient is awake and alert, oriented to place and time.   Memory subjective described as intact.  Attention span & concentration ability appears normal.  Speech is fluent,  without  dysarthria, dysphonia or aphasia.  Mood and affect are appropriate.   Cranial nerves:Pupils are equal and briskly reactive to light. Hearing intact. Facial sensation intact to fine touch. Facial motor strength is symmetric and tongue and uvula move midline. Shoulder shrug was symmetrical.  Motor exam:   Normal tone, muscle bulk and symmetric strength in all extremities. Deep tendon reflexes: in the upper and lower extremities are symmetric and intact.     ASSESSMENT  AND PLAN 67 y.o. year old female  here with: severe OSA AHI was 34/h . She has lost 60 pounds since being on Ozempic , still snores- but has more night sweating .     1) Struggling with CPAP compliance, mainly because sleep is fragmented and its hard to get 6 plus hours of sleep. Still feeling menopausal symptoms.   2) She has overcome an URI in early October, which reduced her compliance .  3) consider Zoloft for prolonged menopausal symptoms - please discuss with PCP and Gyn.   4) I will repeat a HST to see how her weight loss may have influenced her apnea.  If she still needs CPAP  will meet in 12 months.    I plan to follow up either personally or through our NP within 12 months.   I would like to thank Corwin Levins, MD  for allowing me to meet with and to take care of this pleasant patient.     After spending a total time of  25  minutes face to face and additional time for physical and neurologic examination, review of laboratory studies,  personal review of imaging studies, reports and results of other testing and review of referral information / records as far as provided in visit,   Electronically signed by: Melvyn Novas, MD 08/24/2023 1:21 PM  [facility] and [employer] certified by The [employer] of Sleep Medicine and Diplomate of the [employer] of Sleep Medicine. Board certified In Neurology through the ABPN, Fellow of the [employer] of Neurology.

## 2023-08-26 ENCOUNTER — Encounter (HOSPITAL_COMMUNITY): Payer: Self-pay

## 2023-08-26 ENCOUNTER — Other Ambulatory Visit (HOSPITAL_COMMUNITY): Payer: Self-pay

## 2023-09-01 ENCOUNTER — Other Ambulatory Visit (HOSPITAL_COMMUNITY): Payer: Self-pay

## 2023-09-08 ENCOUNTER — Other Ambulatory Visit (HOSPITAL_COMMUNITY): Payer: Self-pay

## 2023-09-08 ENCOUNTER — Other Ambulatory Visit: Payer: Self-pay | Admitting: Internal Medicine

## 2023-09-08 ENCOUNTER — Other Ambulatory Visit: Payer: Self-pay

## 2023-09-11 ENCOUNTER — Other Ambulatory Visit (HOSPITAL_COMMUNITY): Payer: Self-pay

## 2023-09-11 ENCOUNTER — Other Ambulatory Visit: Payer: Self-pay

## 2023-09-11 MED ORDER — DAPAGLIFLOZIN PROPANEDIOL 5 MG PO TABS
5.0000 mg | ORAL_TABLET | Freq: Every day | ORAL | 3 refills | Status: DC
  Filled 2023-09-11: qty 90, 90d supply, fill #0
  Filled 2023-12-14 – 2023-12-15 (×2): qty 90, 90d supply, fill #1
  Filled 2024-03-06: qty 90, 90d supply, fill #2
  Filled 2024-06-04: qty 90, 90d supply, fill #3

## 2023-09-12 ENCOUNTER — Other Ambulatory Visit (HOSPITAL_COMMUNITY): Payer: Self-pay

## 2023-09-13 ENCOUNTER — Other Ambulatory Visit (HOSPITAL_COMMUNITY): Payer: Self-pay

## 2023-09-13 MED ORDER — GABAPENTIN 100 MG PO CAPS
100.0000 mg | ORAL_CAPSULE | Freq: Two times a day (BID) | ORAL | 1 refills | Status: DC
  Filled 2023-09-13: qty 60, 30d supply, fill #0
  Filled 2024-03-06: qty 60, 30d supply, fill #1

## 2023-09-16 ENCOUNTER — Other Ambulatory Visit (HOSPITAL_COMMUNITY): Payer: Self-pay

## 2023-09-21 ENCOUNTER — Other Ambulatory Visit (HOSPITAL_COMMUNITY): Payer: Self-pay

## 2023-09-21 ENCOUNTER — Telehealth: Payer: Self-pay | Admitting: Internal Medicine

## 2023-09-21 MED ORDER — VALACYCLOVIR HCL 1 G PO TABS
1000.0000 mg | ORAL_TABLET | Freq: Three times a day (TID) | ORAL | 5 refills | Status: AC
  Filled 2023-09-21: qty 21, 7d supply, fill #0
  Filled 2024-04-19: qty 21, 7d supply, fill #1
  Filled 2024-06-04: qty 21, 7d supply, fill #2

## 2023-09-21 NOTE — Telephone Encounter (Signed)
Prescription Request  09/21/2023  LOV: 03/29/2023  What is the name of the medication or equipment?  valACYclovir (VALTREX) 1000 MG tablet   Have you contacted your pharmacy to request a refill? No   Which pharmacy would you like this sent to?  [facility] [address] [place] [address] Phone: [phone] Fax: [phone]    Patient notified that their request is being sent to the clinical staff for review and that they should receive a response within 2 business days.   Please advise at Mobile [phone] (mobile)

## 2023-09-21 NOTE — Telephone Encounter (Signed)
Ok done erx 

## 2023-09-22 ENCOUNTER — Other Ambulatory Visit (HOSPITAL_COMMUNITY): Payer: Self-pay

## 2023-09-24 ENCOUNTER — Other Ambulatory Visit: Payer: Self-pay | Admitting: Internal Medicine

## 2023-09-25 ENCOUNTER — Other Ambulatory Visit (HOSPITAL_COMMUNITY): Payer: Self-pay

## 2023-09-25 ENCOUNTER — Other Ambulatory Visit: Payer: Self-pay

## 2023-09-25 MED ORDER — OZEMPIC (2 MG/DOSE) 8 MG/3ML ~~LOC~~ SOPN
2.0000 mg | PEN_INJECTOR | SUBCUTANEOUS | 3 refills | Status: DC
  Filled 2023-09-25 – 2023-10-09 (×2): qty 9, 84d supply, fill #0

## 2023-09-27 ENCOUNTER — Ambulatory Visit: Payer: Medicare Other | Admitting: Neurology

## 2023-09-27 DIAGNOSIS — G4733 Obstructive sleep apnea (adult) (pediatric): Secondary | ICD-10-CM | POA: Diagnosis not present

## 2023-09-27 DIAGNOSIS — N951 Menopausal and female climacteric states: Secondary | ICD-10-CM

## 2023-09-27 DIAGNOSIS — E66812 Obesity, class 2: Secondary | ICD-10-CM

## 2023-09-28 NOTE — Progress Notes (Signed)
[facility] [place] 67 year old female [birth year]    HOME SLEEP TEST REPORT ( by Watch PAT)   STUDY DATE:  09-28-2023    ORDERING CLINICIAN: Melvyn Novas, MD  REFERRING CLINICIAN:    CLINICAL INFORMATION/HISTORY: vasomotor related symptoms of sleep interruption/ menopause, Suzanne Hansen is a 67 y.o. female patient who is here for revisit 08/24/2023 for  a CPAP/ OSA compliance visit.    Chief concern according to patient :  " I just don't sleep well- but I am sleepier when I haven't used CPAP" .   Last HST was 02-18-2019 and the patient's insurance initially did not cover the CPAP costs, she used a loaner for a while. This current machine was covered in 2021.  She continues to struggle with some insomnia and how well she sleeps overall.  Has made changes to the density of the mattress and the material of the bed coverings that still not necessarily counting on a good night for every night.  Over the last 14 days her compliance has been excellent but the first half of October showed a compliance of less than 20% by hours.  The average use of time when the machine is used is about 6 hours at night she has an AutoSet is 10 with a minimum pressure of 5 and maximum pressure of 13 and the EPR of 3 cm water and her residual AHI is only 1.2/h so this is a good resolution of apnea.  The 95th percentile pressure is 8 cm water the 95th percentile air leak is 13.3 L/min.  Past sleep test before weight loss :67 y.o. year old female  here with: severe OSA AHI was 34/h . She has lost 60 pounds since being on Ozempic , still snores- but has more night sweating .        Epworth sleepiness score: Today's fatigue severity scale was endorsed at 20 out of 63 points and the Epworth sleepiness score was endorsed at 7 out of 24 points.    BMI: 36 kg/m   Neck Circumference: 16"   FINDINGS:   Sleep Summary:   Total Recording Time (hours, min): 7 hours 42 minutes      Total  Sleep Time (hours, min):       6 hours 35 minutes          Percent REM (%):    18%                                    Respiratory Indices:   Calculated pAHI (per hour):       9.0/h by CMS criteria and 13.6/h by AASM criteria.                      REM pAHI:   29.8 by CMS criteria                                              NREM pAHI:     4.9 by CMS                         Supine AHI:   13.0/h by CMS criteria.   nonsupine sleep reached an AHI of only  0.5/h.                                                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  There is a significant decrease in apnea hypopnea index seen in comparison to her previous study when she was heavier. It is due to the REM sleep dependency that the patient cannot expect resolution of apnea by dental device or by hypoglossal nerve stimulation. REM sleep dominated apnea is most frequently associated with obesity and weight loss can make a significant difference.   RECOMMENDATION:  Continuation of positive airway pressure therapy, between 6 and 10 cm water pressure with 1 cm EPR, heated humidification and interface of patient's choice.    INTERPRETING PHYSICIAN:   Melvyn Novas, MD  [facility] and [facility] Sleep Board certified by The [employer] of Sleep Medicine and Diplomate of the [employer] of Sleep Medicine. Board certified In Neurology through the ABPN, Fellow of the [employer] of Neurology.

## 2023-10-01 NOTE — Procedures (Signed)
[facility] [place] 67 year old female July 14, 1956    HOME SLEEP TEST REPORT ( by Watch PAT)   STUDY DATE:  09-28-2023    ORDERING CLINICIAN: Melvyn Novas, MD  REFERRING CLINICIAN:    CLINICAL INFORMATION/HISTORY: vasomotor related symptoms of sleep interruption/ menopause, Kyle Rachford is a 67 y.o. female patient who is here for revisit 08/24/2023 for  a CPAP/ OSA compliance visit.    Chief concern according to patient :  " I just don't sleep well- but I am sleepier when I haven't used CPAP" .   Last HST was 02-18-2019 and the patient's insurance initially did not cover the CPAP costs, she used a loaner for a while. This current machine was covered in 2021.  She continues to struggle with some insomnia and how well she sleeps overall.  Has made changes to the density of the mattress and the material of the bed coverings that still not necessarily counting on a good night for every night.  Over the last 14 days her compliance has been excellent but the first half of October showed a compliance of less than 20% by hours.  The average use of time when the machine is used is about 6 hours at night she has an AutoSet is 10 with a minimum pressure of 5 and maximum pressure of 13 and the EPR of 3 cm water and her residual AHI is only 1.2/h so this is a good resolution of apnea.  The 95th percentile pressure is 8 cm water the 95th percentile air leak is 13.3 L/min.  Past sleep test before weight loss :67 y.o. year old female  here with: severe OSA AHI was 34/h . She has lost 60 pounds since being on Ozempic , still snores- but has more night sweating .        Epworth sleepiness score: Today's fatigue severity scale was endorsed at 20 out of 63 points and the Epworth sleepiness score was endorsed at 7 out of 24 points.    BMI: 36 kg/m   Neck Circumference: 16"   FINDINGS:   Sleep Summary:   Total Recording Time (hours, min): 7 hours 42 minutes      Total  Sleep Time (hours, min):       6 hours 35 minutes          Percent REM (%):    18%                                    Respiratory Indices:   Calculated pAHI (per hour):       9.0/h by CMS criteria and 13.6/h by AASM criteria.                      REM pAHI:   29.8 by CMS criteria                                              NREM pAHI:     4.9 by CMS                         Supine AHI:   13.0/h by CMS criteria.   nonsupine sleep reached an AHI of only 0.5/h.  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  There is a significant decrease in apnea hypopnea index seen in comparison to her previous study when she was heavier. It is due to the REM sleep dependency that the patient cannot expect resolution of apnea by dental device or by hypoglossal nerve stimulation. REM sleep dominated apnea is most frequently associated with obesity and weight loss can make a significant difference.   RECOMMENDATION:  Continuation of positive airway pressure therapy, between 6 and 10 cm water pressure with 1 cm EPR, heated humidification and interface of patient's choice.    INTERPRETING PHYSICIAN:   Melvyn Novas, MD  [facility] and [facility] Sleep Board certified by The [employer] of Sleep Medicine and Diplomate of the [employer] of Sleep Medicine. Board certified In Neurology through the ABPN, Fellow of the [employer] of Neurology.

## 2023-10-01 NOTE — Addendum Note (Signed)
Addended by: Melvyn Novas on: 10/01/2023 08:49 PM   Modules accepted: Orders

## 2023-10-02 ENCOUNTER — Other Ambulatory Visit (HOSPITAL_COMMUNITY): Payer: Self-pay

## 2023-10-03 ENCOUNTER — Telehealth: Payer: Self-pay

## 2023-10-03 NOTE — Telephone Encounter (Signed)
Suzanne Hansen, CMA  Ellis Parents, Wyvonnia Dusky, [place]; Angus Seller, Wray Kearns, Elease Hashimoto New orders have been placed for the above pt, DOB: 01-24-2056 Thanks

## 2023-10-03 NOTE — Telephone Encounter (Signed)
I called pt. I advised pt that Dr. Vickey Huger reviewed their sleep study results and found that pt mild-sever. Dr. Vickey Huger recommends that pt continue CPAP therapy. I reviewed PAP compliance expectations with the pt. Pt had no questions at this time but was encouraged to call back if questions arise. I have sent the order to [facility] and have received confirmation that they have received the order.

## 2023-10-03 NOTE — Telephone Encounter (Signed)
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  There is a significant decrease in apnea hypopnea index seen in comparison to her previous study when she was heavier. It is due to the REM sleep dependency that the patient cannot expect resolution of apnea by dental device or by hypoglossal nerve stimulation. REM sleep dominated apnea is most frequently associated with obesity and weight loss can make a significant difference. Continuation of positive airway pressure therapy, between 6 and 10 cm water pressure with 1 cm EPR, heated humidification and interface of patient's choice.

## 2023-10-05 ENCOUNTER — Other Ambulatory Visit (HOSPITAL_COMMUNITY): Payer: Self-pay

## 2023-10-09 ENCOUNTER — Other Ambulatory Visit: Payer: Self-pay | Admitting: Pulmonary Disease

## 2023-10-09 ENCOUNTER — Other Ambulatory Visit (HOSPITAL_COMMUNITY): Payer: Self-pay

## 2023-10-10 ENCOUNTER — Other Ambulatory Visit: Payer: Self-pay | Admitting: Anesthesiology

## 2023-10-10 ENCOUNTER — Other Ambulatory Visit: Payer: Self-pay

## 2023-10-10 ENCOUNTER — Ambulatory Visit (HOSPITAL_BASED_OUTPATIENT_CLINIC_OR_DEPARTMENT_OTHER): Payer: 59 | Admitting: Cardiovascular Disease

## 2023-10-10 ENCOUNTER — Other Ambulatory Visit (HOSPITAL_COMMUNITY): Payer: Self-pay

## 2023-10-18 ENCOUNTER — Ambulatory Visit: Payer: Managed Care, Other (non HMO) | Admitting: Internal Medicine

## 2023-10-18 ENCOUNTER — Other Ambulatory Visit: Payer: Self-pay | Admitting: Pulmonary Disease

## 2023-10-18 ENCOUNTER — Other Ambulatory Visit (HOSPITAL_COMMUNITY): Payer: Self-pay

## 2023-10-18 ENCOUNTER — Encounter: Payer: Self-pay | Admitting: Internal Medicine

## 2023-10-18 ENCOUNTER — Telehealth: Payer: Self-pay | Admitting: Pharmacy Technician

## 2023-10-18 VITALS — BP 122/70 | HR 82 | Temp 98.3°F | Ht 63.0 in | Wt 196.0 lb

## 2023-10-18 DIAGNOSIS — E1122 Type 2 diabetes mellitus with diabetic chronic kidney disease: Secondary | ICD-10-CM

## 2023-10-18 DIAGNOSIS — J45901 Unspecified asthma with (acute) exacerbation: Secondary | ICD-10-CM

## 2023-10-18 DIAGNOSIS — T7840XA Allergy, unspecified, initial encounter: Secondary | ICD-10-CM | POA: Diagnosis not present

## 2023-10-18 DIAGNOSIS — Z7984 Long term (current) use of oral hypoglycemic drugs: Secondary | ICD-10-CM

## 2023-10-18 DIAGNOSIS — N1831 Chronic kidney disease, stage 3a: Secondary | ICD-10-CM

## 2023-10-18 DIAGNOSIS — J019 Acute sinusitis, unspecified: Secondary | ICD-10-CM

## 2023-10-18 DIAGNOSIS — E119 Type 2 diabetes mellitus without complications: Secondary | ICD-10-CM

## 2023-10-18 DIAGNOSIS — Z7985 Long-term (current) use of injectable non-insulin antidiabetic drugs: Secondary | ICD-10-CM

## 2023-10-18 DIAGNOSIS — Z23 Encounter for immunization: Secondary | ICD-10-CM | POA: Diagnosis not present

## 2023-10-18 MED ORDER — METHYLPREDNISOLONE ACETATE 80 MG/ML IJ SUSP
80.0000 mg | Freq: Once | INTRAMUSCULAR | Status: AC
  Administered 2023-10-18: 80 mg via INTRAMUSCULAR

## 2023-10-18 MED ORDER — PREDNISONE 10 MG PO TABS
ORAL_TABLET | ORAL | 0 refills | Status: DC
  Filled 2023-10-18: qty 18, 9d supply, fill #0

## 2023-10-18 MED ORDER — HYDROCOD POLI-CHLORPHE POLI ER 10-8 MG/5ML PO SUER
5.0000 mL | Freq: Two times a day (BID) | ORAL | 0 refills | Status: DC | PRN
  Filled 2023-10-18: qty 115, 12d supply, fill #0

## 2023-10-18 MED ORDER — LEVOFLOXACIN 500 MG PO TABS
500.0000 mg | ORAL_TABLET | Freq: Every day | ORAL | 0 refills | Status: DC
  Filled 2023-10-18: qty 10, 10d supply, fill #0

## 2023-10-18 MED ORDER — PROMETHAZINE-DM 6.25-15 MG/5ML PO SYRP
5.0000 mL | ORAL_SOLUTION | Freq: Four times a day (QID) | ORAL | 0 refills | Status: DC | PRN
  Filled 2023-10-18: qty 118, 6d supply, fill #0

## 2023-10-18 NOTE — Patient Instructions (Signed)
 You had the steroid shot today  You had the flu shot today  Please take all new medication as prescribed - the antibiotic, cough medicine and prednisone   Please continue all other medications as before, and refills have been done if requested.  Please have the pharmacy call with any other refills you may need.  Please keep your appointments with your specialists as you may have planned

## 2023-10-18 NOTE — Telephone Encounter (Signed)
 Ok for change to promethazine DM for cough as needed if ok with pt - done erx

## 2023-10-18 NOTE — Telephone Encounter (Signed)
 See below

## 2023-10-18 NOTE — Telephone Encounter (Signed)
 Pharmacy Patient Advocate Encounter  Received notification from [employer] that Prior Authorization for Hydrocod Poli-Chlorphe Poli ER 10-8MG /5ML er suspension has been  CANCELLED. Prior Authorization will not be approved unless patient has a cancer diagnosis. If not, then they would like for Suzanne Hansen to use something OTC.   Key: [id number]

## 2023-10-18 NOTE — Progress Notes (Signed)
 Patient ID: Suzanne Hansen, female   DOB: 10/20/1955, 68 y.o.   MRN: [id number]        Chief Complaint: follow up cough, wheezing, dm, ckd3a       HPI:  Suzanne Hansen is a 68 y.o. female  Here with 2-3 days acute onset fever, facial pain, pressure, headache, general weakness and malaise, and greenish d/c, with mild ST and cough, but pt denies chest pain, wheezing, increased sob or doe, orthopnea, PND, increased LE swelling, palpitations, dizziness or syncope, except for mild wheezing sob since yesterday.   Pt denies polydipsia, polyuria, or new focal neuro s/s.    Pt denies wt loss, night sweats, loss of appetite, or other constitutional symptoms       Wt Readings from Last 3 Encounters:  10/18/23 196 lb (88.9 kg)  08/24/23 203 lb (92.1 kg)  03/29/23 211 lb (95.7 kg)   BP Readings from Last 3 Encounters:  10/18/23 122/70  08/24/23 135/79  03/29/23 124/72         Past Medical History:  Diagnosis Date   ALLERGIC RHINITIS 05/09/2008   Allergy    ANXIETY 05/23/2007   ARM PAIN, LEFT 10/23/2009   Arthritis    ASTHMA 05/23/2007   Back pain    Cardiomyopathy (HCC) 09/08/2015   Cataract    Chest pain    CHF (congestive heart failure) (HCC)    Chronic diastolic heart failure (HCC) 01/22/2020   Constipation    CONTACT DERMATITIS 06/09/2009   Diabetes mellitus without complication (HCC)    type II   Dyspnea    allergies-dust, cig smoke, rag weeds   ENDOMETRIOSIS NOS 05/23/2007   FACIAL PAIN 06/02/2010   Food allergy    FREQUENCY, URINARY 05/17/2010   GERD (gastroesophageal reflux disease)    GLUCOSE INTOLERANCE 08/28/2009   HERPES SIMPLEX, UNCOMPLICATED 05/23/2007   Hyperlipidemia 08/28/2015   HYPERTHYROIDISM 05/23/2007   Hypoactive thyroid     HYPOTHYROIDISM 05/09/2008   Impaired glucose tolerance 01/28/2011   INSOMNIA, HX OF 05/23/2007   Joint pain    LATERAL EPICONDYLITIS, RIGHT 03/10/2009   LIPOMA 10/22/2010   NECK PAIN 11/10/2010   OBESITY 05/23/2007   Osteopenia  09/28/2018   Plantar fasciitis    Both feet   Pre-diabetes    SHOULDER PAIN, LEFT 05/17/2010   SINUSITIS- ACUTE-NOS 11/03/2008   SKIN LESION 11/10/2010   Sleep apnea    Stage III chronic kidney disease (HCC)    Stage III   Unspecified Chest Pain 07/25/2008   UNSPECIFIED URTICARIA 06/04/2009   URI 08/28/2009   UTI 04/29/2008   VITAMIN D  DEFICIENCY 08/28/2009   Wheezing 07/12/2010   Past Surgical History:  Procedure Laterality Date   COLONOSCOPY     COLONOSCOPY WITH PROPOFOL  N/A 04/25/2022   Procedure: COLONOSCOPY WITH PROPOFOL ;  Surgeon: Shila Gustav GAILS, MD;  Location: [facility];  Service: Gastroenterology;  Laterality: N/A;   ENDOMETRIAL ABLATION     LAMINECTOMY  1996   LAPAROTOMY     exploratory   lower back surgery  07/06/2017   cyst   POLYPECTOMY     POLYPECTOMY  04/25/2022   Procedure: POLYPECTOMY;  Surgeon: Shila Gustav GAILS, MD;  Location: [facility];  Service: Gastroenterology;;   s/p endometrial ablation  12/06   TUBAL LIGATION     UTERINE FIBROID SURGERY     WISDOM TOOTH EXTRACTION      reports that she quit smoking about 35 years ago. Her smoking use included cigarettes. She started smoking about 47 years ago. She  has a 6 pack-year smoking history. She has never used smokeless tobacco. She reports current alcohol use of about 1.0 standard drink of alcohol per week. She reports that she does not use drugs. family history includes COPD in her father; Cancer in her maternal grandmother; Depression in her mother; Diabetes in her cousin and sister; Heart disease in her cousin and mother; Hypertension in her cousin and father; Kidney disease in her sister. Allergies  Allergen Reactions   Shellfish Allergy Anaphylaxis   Clarithromycin Nausea And Vomiting   Latex Itching and Other (See Comments)    ITCHING AND COLD SORES AROUND MOUTH WHEN LATEX GLOVES USED BY THE DENTIST/HYGENTIST   Metronidazole  Nausea And Vomiting   Zostavax [Zoster Vaccine Live]     Arm  swelled   Adhesive [Tape] Dermatitis    Plastic tape    Amiloride     foot numbness   Clindamycin Nausea Only and Nausea And Vomiting   Gadobenate     Pt has had some form of dye without issues but isnt sure if she is allergic to this specific agent    Lisinopril  Cough   Current Outpatient Medications on File Prior to Visit  Medication Sig Dispense Refill   acetaminophen  (TYLENOL ) 650 MG CR tablet Take by mouth.     albuterol  (VENTOLIN  HFA) 108 (90 Base) MCG/ACT inhaler Inhale 2 puffs into the lungs every 4 (four) hours as needed. 6.7 g 11   allopurinol  (ZYLOPRIM ) 100 MG tablet Take 2 tablets (200 mg total) by mouth daily. 180 tablet 3   aspirin  81 MG EC tablet Take 81 mg by mouth daily.     Cholecalciferol  (VITAMIN D -1000 MAX ST) 25 MCG (1000 UT) tablet Take by mouth.     dapagliflozin  propanediol (FARXIGA ) 5 MG TABS tablet Take 1 tablet (5 mg total) by mouth daily before breakfast. 90 tablet 3   doxycycline  (VIBRAMYCIN ) 100 MG capsule Two pills daily for 14 days as needed for flares. Take w large glass of water. Don't lay down for 30 mins after taking.     estradiol  (ESTRACE ) 0.1 MG/GM vaginal cream Place 1 Applicatorful vaginally once a week.     ezetimibe  (ZETIA ) 10 MG tablet Take 1 tablet (10 mg total) by mouth daily. 90 tablet 3   fexofenadine (ALLEGRA) 180 MG tablet Take 180 mg by mouth daily.     gabapentin  (NEURONTIN ) 100 MG capsule Take 1 capsule (100 mg total) by mouth 2 (two) times daily as needed. 180 capsule 2   gabapentin  (NEURONTIN ) 100 MG capsule Take 1 capsule (100 mg total) by mouth 2 (two) times daily. 60 capsule 1   gabapentin  (NEURONTIN ) 100 MG capsule Take 1 capsule (100 mg total) by mouth 2 (two) times daily. 60 capsule 1   levothyroxine  (SYNTHROID ) 137 MCG tablet TAKE 1 TABLET BY MOUTH DAILY BEFORE BREAKFAST. 90 tablet 3   losartan  (COZAAR ) 25 MG tablet Take 1/2 tablet by mouth daily. NEED APPOINTMENT 50 tablet 0   magnesium oxide (MAG-OX) 400 MG tablet Take 400 mg  by mouth daily.     metroNIDAZOLE  (METROCREAM ) 0.75 % cream Apply topically.     pantoprazole  (PROTONIX ) 40 MG tablet Take 1 tablet (40 mg total) by mouth daily. Patient needs follow up appointment for future refills. Please call [phone] to schedule an appointment. 90 tablet 0   potassium chloride  SA (KLOR-CON  M) 20 MEQ tablet Take 1 tablet (20 mEq total) by mouth daily. OK to take extra on days you take extra torsemide . 180  tablet 2   Semaglutide , 2 MG/DOSE, (OZEMPIC , 2 MG/DOSE,) 8 MG/3ML SOPN Inject 2 mg into the skin once a week. 9 mL 3   torsemide  (DEMADEX ) 20 MG tablet Take 3 tablets (60 mg total) by mouth daily. 270 tablet 1   No current facility-administered medications on file prior to visit.        ROS:  All others reviewed and negative.  Objective        PE:  BP 122/70 (BP Location: Right Arm, Patient Position: Sitting, Cuff Size: Normal)   Pulse 82   Temp 98.3 F (36.8 C) (Oral)   Ht 5' 3 (1.6 m)   Wt 196 lb (88.9 kg)   SpO2 99%   BMI 34.72 kg/m                 Constitutional: Pt appears mild ill               HENT: Head: NCAT.                Right Ear: External ear normal.                 Left Ear: External ear normal. Bilat tm's with mild erythema.  Max sinus areas mild tender.  Pharynx with mild erythema, no exudate                 Eyes: . Pupils are equal, round, and reactive to light. Conjunctivae and EOM are normal               Nose: without d/c or deformity               Neck: Neck supple. Gross normal ROM               Cardiovascular: Normal rate and regular rhythm.                 Pulmonary/Chest: Effort normal and breath sounds without rales but  mild bilat wheezing.                              Neurological: Pt is alert. At baseline orientation, motor grossly intact               Skin: Skin is warm. No rashes, no other new lesions, LE edema - none               Psychiatric: Pt behavior is normal without agitation   Micro: none  Cardiac tracings I  have personally interpreted today:  none  Pertinent Radiological findings (summarize): none   Lab Results  Component Value Date   WBC 12.1 (H) 10/19/2022   HGB 13.4 10/19/2022   HCT 41.2 10/19/2022   PLT 281.0 10/19/2022   GLUCOSE 84 03/28/2023   CHOL 143 03/28/2023   TRIG 68.0 03/28/2023   HDL 68.10 03/28/2023   LDLCALC 61 03/28/2023   ALT 18 03/28/2023   AST 16 03/28/2023   NA 142 03/28/2023   K 3.9 03/28/2023   CL 110 03/28/2023   CREATININE 1.18 03/28/2023   BUN 12 03/28/2023   CO2 24 03/28/2023   TSH 2.09 10/19/2022   INR 1.0 03/21/2017   HGBA1C 5.2 03/28/2023   MICROALBUR 0.8 10/19/2022   Assessment/Plan:  Danissa Carns is a 68 y.o. Black or African American [2] female with  has a past medical history of ALLERGIC RHINITIS (05/09/2008), Allergy, ANXIETY (05/23/2007), ARM PAIN, LEFT (10/23/2009),  Arthritis, ASTHMA (05/23/2007), Back pain, Cardiomyopathy (HCC) (09/08/2015), Cataract, Chest pain, CHF (congestive heart failure) (HCC), Chronic diastolic heart failure (HCC) (95/85/7978), Constipation, CONTACT DERMATITIS (06/09/2009), Diabetes mellitus without complication (HCC), Dyspnea, ENDOMETRIOSIS NOS (05/23/2007), FACIAL PAIN (06/02/2010), Food allergy, FREQUENCY, URINARY (05/17/2010), GERD (gastroesophageal reflux disease), GLUCOSE INTOLERANCE (08/28/2009), HERPES SIMPLEX, UNCOMPLICATED (05/23/2007), Hyperlipidemia (08/28/2015), HYPERTHYROIDISM (05/23/2007), Hypoactive thyroid , HYPOTHYROIDISM (05/09/2008), Impaired glucose tolerance (01/28/2011), INSOMNIA, HX OF (05/23/2007), Joint pain, LATERAL EPICONDYLITIS, RIGHT (03/10/2009), LIPOMA (10/22/2010), NECK PAIN (11/10/2010), OBESITY (05/23/2007), Osteopenia (09/28/2018), Plantar fasciitis, Pre-diabetes, SHOULDER PAIN, LEFT (05/17/2010), SINUSITIS- ACUTE-NOS (11/03/2008), SKIN LESION (11/10/2010), Sleep apnea, Stage III chronic kidney disease (HCC), Unspecified Chest Pain (07/25/2008), UNSPECIFIED URTICARIA (06/04/2009), URI  (08/28/2009), UTI (04/29/2008), VITAMIN D  DEFICIENCY (08/28/2009), and Wheezing (07/12/2010).  Diabetes (HCC) Lab Results  Component Value Date   HGBA1C 5.2 03/28/2023   Stable, pt to continue current medical treatment farxiga  5 mg every day, ozempic  2 mg once weekly   Asthma with exacerbation Mild to mod, for depomedrol Im 80 mg, prednisone  taper, continue inhaler prn, to f/u any worsening symptoms or concerns  Stage 3 chronic kidney disease (HCC) Lab Results  Component Value Date   CREATININE 1.18 03/28/2023   Stable overall, cont to avoid nephrotoxins  Acute sinus infection Mild to mod, for antibx course levaquin  500 every day, cough med prn,,  to f/u any worsening symptoms or concerns  Followup: Return if symptoms worsen or fail to improve.  Lynwood Rush, MD 10/20/2023 9:17 PM [facility]

## 2023-10-19 ENCOUNTER — Other Ambulatory Visit (HOSPITAL_COMMUNITY): Payer: Self-pay

## 2023-10-19 ENCOUNTER — Other Ambulatory Visit: Payer: Self-pay

## 2023-10-19 MED ORDER — SYMBICORT 160-4.5 MCG/ACT IN AERO
2.0000 | INHALATION_SPRAY | Freq: Two times a day (BID) | RESPIRATORY_TRACT | 0 refills | Status: DC
  Filled 2023-10-19 – 2023-12-22 (×4): qty 10.2, 30d supply, fill #0

## 2023-10-19 NOTE — Telephone Encounter (Signed)
 Called and let Pt know she stated she paid out of pocket for the cough syrup.

## 2023-10-20 ENCOUNTER — Encounter: Payer: Self-pay | Admitting: Internal Medicine

## 2023-10-20 NOTE — Assessment & Plan Note (Signed)
 Mild to mod, for depomedrol Im 80 mg, prednisone taper, continue inhaler prn, to f/u any worsening symptoms or concerns

## 2023-10-20 NOTE — Assessment & Plan Note (Signed)
 Lab Results  Component Value Date   HGBA1C 5.2 03/28/2023   Stable, pt to continue current medical treatment farxiga 5 mg every day, ozempic 2 mg once weekly

## 2023-10-20 NOTE — Assessment & Plan Note (Signed)
 Lab Results  Component Value Date   CREATININE 1.18 03/28/2023   Stable overall, cont to avoid nephrotoxins

## 2023-10-20 NOTE — Assessment & Plan Note (Signed)
Mild to mod, for antibx course levaquin 500 every day, cough med prn,,  to f/u any worsening symptoms or concerns

## 2023-10-25 ENCOUNTER — Other Ambulatory Visit (HOSPITAL_COMMUNITY): Payer: Self-pay

## 2023-11-13 ENCOUNTER — Ambulatory Visit: Payer: Self-pay | Admitting: Internal Medicine

## 2023-11-13 NOTE — Telephone Encounter (Signed)
Pt scheduled for sooner, 11/15/23

## 2023-11-13 NOTE — Telephone Encounter (Signed)
Copied from CRM [id number]. Topic: Clinical - Red Word Triage >> Nov 13, 2023  1:16 PM Chantha C wrote: Red Word that prompted transfer to Nurse Triage: Patient [phone] states pain in head on right side of head, and right ear also, light headed, shaking in head. Patient denies a fever, dizziness, short of breath. Patient scheduled an appointment 11/17/23 and wants to be seen sooner.  Chief Complaint: pain Symptoms: 6/10 Right side of head, 6/10 right ear-inside - pressure around top of head and around bilateral eyes,ot and cold - but also has hot flashesLightheaded and tremors started yesterday but none today,   Frequency: 1 week Pertinent Negatives: Patient denies fever Disposition: [] ED /[] Urgent Care (no appt availability in office) / [x] Appointment(In office/virtual)/ []  [address] [facility]/ [] Home Care/ [] Refused Recommended Disposition /[] [facility]/ []  Follow-up with PCP Additional Notes: previously had cold s/s and prescribed ABX  on 10/18/2023. Once finished ABX new s/s starated  Reason for Disposition . [1] MODERATE pain (e.g., interferes with normal activities) AND [2] present > 3 days  Answer Assessment - Initial Assessment Questions 1. ONSET: "When did the muscle aches or body pains start?"      1 week 2. LOCATION: "What part of your body is hurting?" (e.g., entire body, arms, legs)      6/10 Right side of head, 6/10 right ear-inside - pressure around top of head and around bilateral eyes 3. SEVERITY: "How bad is the pain?" (Scale 1-10; or mild, moderate, severe)   - MILD (1-3): doesn't interfere with normal activities    - MODERATE (4-7): interferes with normal activities or awakens from sleep    - SEVERE (8-10):  excruciating pain, unable to do any normal activities      moderate 4. CAUSE: "What do you think is causing the pains?"     N/a 5. FEVER: "Have you been having fever?"     Hot and cold - but also has hot flashes 6. OTHER SYMPTOMS: "Do you have any  other symptoms?" (e.g., chest pain, weakness, rash, cold or flu symptoms, weight loss)  Lightheaded and tremors started yesterday but none today    7. PREGNANCY: "Is there any chance you are pregnant?" "When was your last menstrual period?"     N/a 8. TRAVEL: "Have you traveled out of the country in the last month?" (e.g., travel history, exposures)     N/a  Protocols used: Muscle Aches and Body Pain-A-AH

## 2023-11-15 ENCOUNTER — Encounter: Payer: Self-pay | Admitting: Internal Medicine

## 2023-11-15 ENCOUNTER — Ambulatory Visit (INDEPENDENT_AMBULATORY_CARE_PROVIDER_SITE_OTHER): Payer: Managed Care, Other (non HMO)

## 2023-11-15 ENCOUNTER — Ambulatory Visit: Payer: Managed Care, Other (non HMO) | Admitting: Internal Medicine

## 2023-11-15 ENCOUNTER — Other Ambulatory Visit (HOSPITAL_COMMUNITY): Payer: Self-pay

## 2023-11-15 VITALS — BP 138/82 | HR 83 | Temp 98.1°F | Ht 63.0 in | Wt 200.0 lb

## 2023-11-15 DIAGNOSIS — E559 Vitamin D deficiency, unspecified: Secondary | ICD-10-CM

## 2023-11-15 DIAGNOSIS — R051 Acute cough: Secondary | ICD-10-CM

## 2023-11-15 DIAGNOSIS — E538 Deficiency of other specified B group vitamins: Secondary | ICD-10-CM | POA: Diagnosis not present

## 2023-11-15 DIAGNOSIS — H6991 Unspecified Eustachian tube disorder, right ear: Secondary | ICD-10-CM | POA: Diagnosis not present

## 2023-11-15 DIAGNOSIS — E611 Iron deficiency: Secondary | ICD-10-CM | POA: Diagnosis not present

## 2023-11-15 DIAGNOSIS — I10 Essential (primary) hypertension: Secondary | ICD-10-CM

## 2023-11-15 DIAGNOSIS — Z0000 Encounter for general adult medical examination without abnormal findings: Secondary | ICD-10-CM | POA: Diagnosis not present

## 2023-11-15 DIAGNOSIS — J069 Acute upper respiratory infection, unspecified: Secondary | ICD-10-CM

## 2023-11-15 DIAGNOSIS — E038 Other specified hypothyroidism: Secondary | ICD-10-CM

## 2023-11-15 DIAGNOSIS — Z0001 Encounter for general adult medical examination with abnormal findings: Secondary | ICD-10-CM

## 2023-11-15 DIAGNOSIS — E119 Type 2 diabetes mellitus without complications: Secondary | ICD-10-CM | POA: Diagnosis not present

## 2023-11-15 DIAGNOSIS — E7800 Pure hypercholesterolemia, unspecified: Secondary | ICD-10-CM

## 2023-11-15 DIAGNOSIS — N1831 Chronic kidney disease, stage 3a: Secondary | ICD-10-CM

## 2023-11-15 LAB — CBC WITH DIFFERENTIAL/PLATELET
Basophils Absolute: 0 10*3/uL (ref 0.0–0.1)
Basophils Relative: 0.6 % (ref 0.0–3.0)
Eosinophils Absolute: 0.1 10*3/uL (ref 0.0–0.7)
Eosinophils Relative: 1.3 % (ref 0.0–5.0)
HCT: 45 % (ref 36.0–46.0)
Hemoglobin: 14.5 g/dL (ref 12.0–15.0)
Lymphocytes Relative: 38 % (ref 12.0–46.0)
Lymphs Abs: 3.1 10*3/uL (ref 0.7–4.0)
MCHC: 32.2 g/dL (ref 30.0–36.0)
MCV: 89.5 fL (ref 78.0–100.0)
Monocytes Absolute: 0.8 10*3/uL (ref 0.1–1.0)
Monocytes Relative: 9.8 % (ref 3.0–12.0)
Neutro Abs: 4.1 10*3/uL (ref 1.4–7.7)
Neutrophils Relative %: 50.3 % (ref 43.0–77.0)
Platelets: 272 10*3/uL (ref 150.0–400.0)
RBC: 5.03 Mil/uL (ref 3.87–5.11)
RDW: 14.7 % (ref 11.5–15.5)
WBC: 8.2 10*3/uL (ref 4.0–10.5)

## 2023-11-15 LAB — HEPATIC FUNCTION PANEL
ALT: 18 U/L (ref 0–35)
AST: 18 U/L (ref 0–37)
Albumin: 4.1 g/dL (ref 3.5–5.2)
Alkaline Phosphatase: 81 U/L (ref 39–117)
Bilirubin, Direct: 0.1 mg/dL (ref 0.0–0.3)
Total Bilirubin: 0.5 mg/dL (ref 0.2–1.2)
Total Protein: 6.7 g/dL (ref 6.0–8.3)

## 2023-11-15 LAB — VITAMIN B12: Vitamin B-12: 287 pg/mL (ref 211–911)

## 2023-11-15 LAB — LIPID PANEL
Cholesterol: 154 mg/dL (ref 0–200)
HDL: 80.9 mg/dL (ref >39.00)
LDL Cholesterol: 55 mg/dL (ref 0–99)
NonHDL: 73.23
Total CHOL/HDL Ratio: 2
Triglycerides: 89 mg/dL (ref 0.0–149.0)
VLDL: 17.8 mg/dL (ref 0.0–40.0)

## 2023-11-15 LAB — IBC PANEL
Iron: 66 ug/dL (ref 42–145)
Saturation Ratios: 14 % — ABNORMAL LOW (ref 20.0–50.0)
TIBC: 471.8 ug/dL — ABNORMAL HIGH (ref 250.0–450.0)
Transferrin: 337 mg/dL (ref 212.0–360.0)

## 2023-11-15 LAB — BASIC METABOLIC PANEL
BUN: 13 mg/dL (ref 6–23)
CO2: 28 meq/L (ref 19–32)
Calcium: 9.4 mg/dL (ref 8.4–10.5)
Chloride: 106 meq/L (ref 96–112)
Creatinine, Ser: 1.04 mg/dL (ref 0.40–1.20)
GFR: 55.97 mL/min — ABNORMAL LOW (ref >60.00)
Glucose, Bld: 64 mg/dL — ABNORMAL LOW (ref 70–99)
Potassium: 3.6 meq/L (ref 3.5–5.1)
Sodium: 141 meq/L (ref 135–145)

## 2023-11-15 LAB — VITAMIN D 25 HYDROXY (VIT D DEFICIENCY, FRACTURES): VITD: 29.46 ng/mL — ABNORMAL LOW (ref 30.00–100.00)

## 2023-11-15 LAB — TSH: TSH: 0.01 u[IU]/mL — ABNORMAL LOW (ref 0.35–5.50)

## 2023-11-15 LAB — MICROALBUMIN / CREATININE URINE RATIO
Creatinine,U: 155.5 mg/dL
Microalb Creat Ratio: 0.5 mg/g (ref 0.0–30.0)
Microalb, Ur: <0.7 mg/dL (ref 0.0–1.9)

## 2023-11-15 LAB — T4, FREE: Free T4: 1.3 ng/dL (ref 0.60–1.60)

## 2023-11-15 LAB — HEMOGLOBIN A1C: Hgb A1c MFr Bld: 5.4 % (ref 4.6–6.5)

## 2023-11-15 LAB — FERRITIN: Ferritin: 9.9 ng/mL — ABNORMAL LOW (ref 10.0–291.0)

## 2023-11-15 MED ORDER — TIRZEPATIDE 2.5 MG/0.5ML ~~LOC~~ SOAJ
2.5000 mg | SUBCUTANEOUS | 11 refills | Status: DC
  Filled 2023-11-15 – 2023-11-24 (×2): qty 2, 28d supply, fill #0

## 2023-11-15 MED ORDER — HYDROCODONE BIT-HOMATROP MBR 5-1.5 MG/5ML PO SOLN
5.0000 mL | Freq: Four times a day (QID) | ORAL | 0 refills | Status: AC | PRN
  Filled 2023-11-15: qty 180, 9d supply, fill #0

## 2023-11-15 MED ORDER — DOXYCYCLINE HYCLATE 100 MG PO CAPS
100.0000 mg | ORAL_CAPSULE | Freq: Two times a day (BID) | ORAL | 0 refills | Status: AC
  Filled 2023-11-15: qty 20, 10d supply, fill #0

## 2023-11-15 NOTE — Progress Notes (Signed)
 Patient ID: Suzanne Hansen, female   DOB: Jan 08, 1956, 68 y.o.   MRN: [id number]         Chief Complaint:: wellness exam and Medical Management of Chronic Issues (Pain on right side of head and right ear pressure has been going on for about 2 weeks and has been having headaches on the top of her head for months and has moved to the side of her head and she had the shakes in her head )  , uri, low b12, ckd3a, dm, htn, hld, low thyroid , iron  deficiency, low vit d       HPI:  Suzanne Hansen is a 68 y.o. female here for wellness exam; up to date                     Also  Here with 2-3 days acute onset fever, facial pain, pressure, headache, general weakness and malaise, and greenish d/c, with mild ST and cough and HA with right ear pressure, but pt denies chest pain, wheezing, increased sob or doe, orthopnea, PND, increased LE swelling, palpitations, dizziness or syncope.  Pt denies polydipsia, polyuria, or new focal neuro s/s.    Pt denies fever, wt loss, night sweats, loss of appetite, or other constitutional symptoms  no overt bleeding or bruising   Wt Readings from Last 3 Encounters:  11/15/23 200 lb (90.7 kg)  10/18/23 196 lb (88.9 kg)  08/24/23 203 lb (92.1 kg)   BP Readings from Last 3 Encounters:  11/15/23 138/82  10/18/23 122/70  08/24/23 135/79   Immunization History  Administered Date(s) Administered   Fluad Quad(high Dose 65+) 08/01/2022   Fluad Trivalent(High Dose 65+) 10/18/2023   Influenza Inj Mdck Quad Pf 08/18/2021   Influenza Split 08/01/2011   Influenza,inj,Quad PF,6+ Mos 07/01/2014, 08/28/2015, 08/09/2016, 08/22/2017, 06/19/2019, 06/29/2020   Influenza,inj,Quad PF,6-35 Mos 06/19/2019   Influenza-Unspecified 06/19/2019   PFIZER Comirnaty(Gray Top)Covid-19 Tri-Sucrose Vaccine 12/06/2019, 12/25/2019, 08/28/2020   PFIZER(Purple Top)SARS-COV-2 Vaccination 12/06/2019, 12/25/2019, 08/28/2020   Pneumococcal Conjugate-13 02/16/2016   Pneumococcal Polysaccharide-23 09/27/2017,  10/21/2022   Td 04/09/2004   Td (Adult), 2 Lf Tetanus Toxid, Preservative Free 04/09/2004   Tdap 02/16/2016   Zoster, Live 05/29/2015   There are no preventive care reminders to display for this patient.     Past Medical History:  Diagnosis Date   ALLERGIC RHINITIS 05/09/2008   Allergy    ANXIETY 05/23/2007   ARM PAIN, LEFT 10/23/2009   Arthritis    ASTHMA 05/23/2007   Back pain    Cardiomyopathy (HCC) 09/08/2015   Cataract    Chest pain    CHF (congestive heart failure) (HCC)    Chronic diastolic heart failure (HCC) 01/22/2020   Constipation    CONTACT DERMATITIS 06/09/2009   Diabetes mellitus without complication (HCC)    type II   Dyspnea    allergies-dust, cig smoke, rag weeds   ENDOMETRIOSIS NOS 05/23/2007   FACIAL PAIN 06/02/2010   Food allergy    FREQUENCY, URINARY 05/17/2010   GERD (gastroesophageal reflux disease)    GLUCOSE INTOLERANCE 08/28/2009   HERPES SIMPLEX, UNCOMPLICATED 05/23/2007   Hyperlipidemia 08/28/2015   HYPERTHYROIDISM 05/23/2007   Hypoactive thyroid     HYPOTHYROIDISM 05/09/2008   Impaired glucose tolerance 01/28/2011   INSOMNIA, HX OF 05/23/2007   Joint pain    LATERAL EPICONDYLITIS, RIGHT 03/10/2009   LIPOMA 10/22/2010   NECK PAIN 11/10/2010   OBESITY 05/23/2007   Osteopenia 09/28/2018   Plantar fasciitis    Both feet   Pre-diabetes  SHOULDER PAIN, LEFT 05/17/2010   SINUSITIS- ACUTE-NOS 11/03/2008   SKIN LESION 11/10/2010   Sleep apnea    Stage III chronic kidney disease (HCC)    Stage III   Unspecified Chest Pain 07/25/2008   UNSPECIFIED URTICARIA 06/04/2009   URI 08/28/2009   UTI 04/29/2008   VITAMIN D  DEFICIENCY 08/28/2009   Wheezing 07/12/2010   Past Surgical History:  Procedure Laterality Date   COLONOSCOPY     COLONOSCOPY WITH PROPOFOL  N/A 04/25/2022   Procedure: COLONOSCOPY WITH PROPOFOL ;  Surgeon: Shila Gustav GAILS, MD;  Location: [facility];  Service: Gastroenterology;  Laterality: N/A;   ENDOMETRIAL  ABLATION     LAMINECTOMY  1996   LAPAROTOMY     exploratory   lower back surgery  07/06/2017   cyst   POLYPECTOMY     POLYPECTOMY  04/25/2022   Procedure: POLYPECTOMY;  Surgeon: Shila Gustav GAILS, MD;  Location: [facility];  Service: Gastroenterology;;   s/p endometrial ablation  12/06   TUBAL LIGATION     UTERINE FIBROID SURGERY     WISDOM TOOTH EXTRACTION      reports that she quit smoking about 36 years ago. Her smoking use included cigarettes. She started smoking about 48 years ago. She has a 6 pack-year smoking history. She has never used smokeless tobacco. She reports current alcohol use of about 1.0 standard drink of alcohol per week. She reports that she does not use drugs. family history includes COPD in her father; Cancer in her maternal grandmother; Depression in her mother; Diabetes in her cousin and sister; Heart disease in her cousin and mother; Hypertension in her cousin and father; Kidney disease in her sister. Allergies  Allergen Reactions   Shellfish Allergy Anaphylaxis   Clarithromycin Nausea And Vomiting   Latex Itching and Other (See Comments)    ITCHING AND COLD SORES AROUND MOUTH WHEN LATEX GLOVES USED BY THE DENTIST/HYGENTIST   Metronidazole  Nausea And Vomiting   Zostavax [Zoster Vaccine Live]     Arm swelled   Adhesive [Tape] Dermatitis    Plastic tape    Amiloride     foot numbness   Clindamycin Nausea Only and Nausea And Vomiting   Gadobenate     Pt has had some form of dye without issues but isnt sure if she is allergic to this specific agent    Lisinopril  Cough   Current Outpatient Medications on File Prior to Visit  Medication Sig Dispense Refill   acetaminophen  (TYLENOL ) 650 MG CR tablet Take by mouth.     albuterol  (VENTOLIN  HFA) 108 (90 Base) MCG/ACT inhaler Inhale 2 puffs into the lungs every 4 (four) hours as needed. 6.7 g 11   allopurinol  (ZYLOPRIM ) 100 MG tablet Take 2 tablets (200 mg total) by mouth daily. 180 tablet 3   aspirin  81 MG EC  tablet Take 81 mg by mouth daily.     chlorpheniramine -HYDROcodone  (TUSSIONEX) 10-8 MG/5ML Take 5 mLs by mouth every 12 hours as needed for cough. 115 mL 0   Cholecalciferol  (VITAMIN D -1000 MAX ST) 25 MCG (1000 UT) tablet Take by mouth.     dapagliflozin  propanediol (FARXIGA ) 5 MG TABS tablet Take 1 tablet (5 mg total) by mouth daily before breakfast. 90 tablet 3   estradiol  (ESTRACE ) 0.1 MG/GM vaginal cream Place 1 Applicatorful vaginally once a week.     ezetimibe  (ZETIA ) 10 MG tablet Take 1 tablet (10 mg total) by mouth daily. 90 tablet 3   fexofenadine (ALLEGRA) 180 MG tablet Take 180 mg  by mouth daily.     gabapentin  (NEURONTIN ) 100 MG capsule Take 1 capsule (100 mg total) by mouth 2 (two) times daily as needed. 180 capsule 2   gabapentin  (NEURONTIN ) 100 MG capsule Take 1 capsule (100 mg total) by mouth 2 (two) times daily. 60 capsule 1   gabapentin  (NEURONTIN ) 100 MG capsule Take 1 capsule (100 mg total) by mouth 2 (two) times daily. 60 capsule 1   levofloxacin  (LEVAQUIN ) 500 MG tablet Take 1 tablet (500 mg total) by mouth daily. 10 tablet 0   losartan  (COZAAR ) 25 MG tablet Take 1/2 tablet by mouth daily. NEED APPOINTMENT 50 tablet 0   magnesium oxide (MAG-OX) 400 MG tablet Take 400 mg by mouth daily.     metroNIDAZOLE  (METROCREAM ) 0.75 % cream Apply topically.     pantoprazole  (PROTONIX ) 40 MG tablet Take 1 tablet (40 mg total) by mouth daily. Patient needs follow up appointment for future refills. Please call [phone] to schedule an appointment. 90 tablet 0   potassium chloride  SA (KLOR-CON  M) 20 MEQ tablet Take 1 tablet (20 mEq total) by mouth daily. OK to take extra on days you take extra torsemide . 180 tablet 2   predniSONE  (DELTASONE ) 10 MG tablet Take 3 tablets by mouth per day for 3 days, then 2 tabs per day for 3 days, then 1 tab per day for 3 days as directed. 18 tablet 0   SYMBICORT  160-4.5 MCG/ACT inhaler Inhale 2 puffs into the lungs in the morning and at bedtime. 10.2 g 0    torsemide  (DEMADEX ) 20 MG tablet Take 3 tablets (60 mg total) by mouth daily. 270 tablet 1   No current facility-administered medications on file prior to visit.        ROS:  All others reviewed and negative.  Objective        PE:  BP 138/82 (BP Location: Left Arm, Patient Position: Sitting, Cuff Size: Normal)   Pulse 83   Temp 98.1 F (36.7 C) (Oral)   Ht 5' 3 (1.6 m)   Wt 200 lb (90.7 kg)   SpO2 99%   BMI 35.43 kg/m                 Constitutional: Pt appears in NAD               HENT: Head: NCAT.                Right Ear: External ear normal.                 Left Ear: External ear normal. Bilat tm's with mild erythema.  Max sinus areas non tender.  Pharynx with mild erythema, no exudate               Eyes: . Pupils are equal, round, and reactive to light. Conjunctivae and EOM are normal               Nose: without d/c or deformity               Neck: Neck supple. Gross normal ROM               Cardiovascular: Normal rate and regular rhythm.                 Pulmonary/Chest: Effort normal and breath sounds without rales or wheezing.                Abd:  Soft, NT, ND, + BS, no organomegaly  Neurological: Pt is alert. At baseline orientation, motor grossly intact               Skin: Skin is warm. No rashes, no other new lesions, LE edema - none               Psychiatric: Pt behavior is normal without agitation   Micro: none  Cardiac tracings I have personally interpreted today:  none  Pertinent Radiological findings (summarize): none   Lab Results  Component Value Date   WBC 8.2 11/15/2023   HGB 14.5 11/15/2023   HCT 45.0 11/15/2023   PLT 272.0 11/15/2023   GLUCOSE 64 (L) 11/15/2023   CHOL 154 11/15/2023   TRIG 89.0 11/15/2023   HDL 80.90 11/15/2023   LDLCALC 55 11/15/2023   ALT 18 11/15/2023   AST 18 11/15/2023   NA 141 11/15/2023   K 3.6 11/15/2023   CL 106 11/15/2023   CREATININE 1.04 11/15/2023   BUN 13 11/15/2023   CO2 28 11/15/2023   TSH 0.01  (L) 11/15/2023   INR 1.0 03/21/2017   HGBA1C 5.4 11/15/2023   MICROALBUR <0.7 11/15/2023   Assessment/Plan:  Oyuki Longshore is a 68 y.o. Black or African American [2] female with  has a past medical history of ALLERGIC RHINITIS (05/09/2008), Allergy, ANXIETY (05/23/2007), ARM PAIN, LEFT (10/23/2009), Arthritis, ASTHMA (05/23/2007), Back pain, Cardiomyopathy (HCC) (09/08/2015), Cataract, Chest pain, CHF (congestive heart failure) (HCC), Chronic diastolic heart failure (HCC) (95/85/7978), Constipation, CONTACT DERMATITIS (06/09/2009), Diabetes mellitus without complication (HCC), Dyspnea, ENDOMETRIOSIS NOS (05/23/2007), FACIAL PAIN (06/02/2010), Food allergy, FREQUENCY, URINARY (05/17/2010), GERD (gastroesophageal reflux disease), GLUCOSE INTOLERANCE (08/28/2009), HERPES SIMPLEX, UNCOMPLICATED (05/23/2007), Hyperlipidemia (08/28/2015), HYPERTHYROIDISM (05/23/2007), Hypoactive thyroid , HYPOTHYROIDISM (05/09/2008), Impaired glucose tolerance (01/28/2011), INSOMNIA, HX OF (05/23/2007), Joint pain, LATERAL EPICONDYLITIS, RIGHT (03/10/2009), LIPOMA (10/22/2010), NECK PAIN (11/10/2010), OBESITY (05/23/2007), Osteopenia (09/28/2018), Plantar fasciitis, Pre-diabetes, SHOULDER PAIN, LEFT (05/17/2010), SINUSITIS- ACUTE-NOS (11/03/2008), SKIN LESION (11/10/2010), Sleep apnea, Stage III chronic kidney disease (HCC), Unspecified Chest Pain (07/25/2008), UNSPECIFIED URTICARIA (06/04/2009), URI (08/28/2009), UTI (04/29/2008), VITAMIN D  DEFICIENCY (08/28/2009), and Wheezing (07/12/2010).  Encounter for well adult exam with abnormal findings Age and sex appropriate education and counseling updated with regular exercise and diet Referrals for preventative services - none needed Immunizations addressed - none needed Smoking counseling  - none needed Evidence for depression or other mood disorder - none significant Most recent labs reviewed. I have personally reviewed and have noted: 1) the patient's medical and social  history 2) The patient's current medications and supplements 3) The patient's height, weight, and BMI have been recorded in the chart   Acute dysfunction of right eustachian tube Also for mucinex  bid prn  B12 deficiency Lab Results  Component Value Date   VITAMINB12 287 11/15/2023   Low, to start oral replacement - b12 1000 mcg qd   CKD (chronic kidney disease) Lab Results  Component Value Date   CREATININE 1.04 11/15/2023   Stable overall, cont to avoid nephrotoxins   Diabetes (HCC) Lab Results  Component Value Date   HGBA1C 5.4 11/15/2023   With persistent uncontrolled weight, pt to continue current medical treatment farxiga  5 every day, but change ozempic  to mounjaro  2.5 mg weekly with intent to tritrate   Essential (primary) hypertension BP Readings from Last 3 Encounters:  11/15/23 138/82  10/18/23 122/70  08/24/23 135/79   Stable, pt to continue medical treatment losartan  25 mg qd   Hyperlipidemia Lab Results  Component Value Date   LDLCALC 55 11/15/2023   Stable, pt to  continue current zetia  10 qd   Hypothyroidism Lab Results  Component Value Date   TSH 0.01 (L) 11/15/2023   overcontrolled pt to reduce levothyroxine  to 125 mcg qd   Iron  deficiency Low, for GI referral, start iron  supplement  Upper respiratory infection Mild, for doxycycline  100 bid, cough med prn, to f/u any worsening symptoms or concern, supportive care  Vitamin D  deficiency Last vitamin D  Lab Results  Component Value Date   VD25OH 29.46 (L) 11/15/2023   Low, to start oral replacement   Cough Also for cxr  Followup: Return in about 6 months (around 05/14/2024).  Lynwood Rush, MD 11/19/2023 12:34 PM [facility]

## 2023-11-15 NOTE — Patient Instructions (Addendum)
 Please take all new medication as prescribed - the antibiotic and cough medicine if needed  Ok to change the ozempic  to the mounjaro  if ok with insurance, and let us  know every month if you are doing well, so that we should increase the dose  Please continue all other medications as before, and refills have been done if requested.  Please have the pharmacy call with any other refills you may need.  Please continue your efforts at being more active, low cholesterol diet, and weight control.  You are otherwise up to date with prevention measures today.  Please keep your appointments with your specialists as you may have planned  Please go to the XRAY Department in the first floor for the x-ray testing  Please go to the LAB at the blood drawing area for the tests to be done  Please make an Appointment to return in 6 months, or sooner if needed

## 2023-11-16 ENCOUNTER — Other Ambulatory Visit: Payer: Self-pay | Admitting: Internal Medicine

## 2023-11-16 ENCOUNTER — Other Ambulatory Visit (HOSPITAL_COMMUNITY): Payer: Self-pay

## 2023-11-16 ENCOUNTER — Encounter: Payer: Self-pay | Admitting: Internal Medicine

## 2023-11-16 DIAGNOSIS — D509 Iron deficiency anemia, unspecified: Secondary | ICD-10-CM

## 2023-11-16 LAB — URINALYSIS, ROUTINE W REFLEX MICROSCOPIC
Bilirubin Urine: NEGATIVE
Hgb urine dipstick: NEGATIVE
Ketones, ur: NEGATIVE
Leukocytes,Ua: NEGATIVE
Nitrite: NEGATIVE
RBC / HPF: NONE SEEN (ref 0–?)
Specific Gravity, Urine: 1.025 (ref 1.000–1.030)
Total Protein, Urine: NEGATIVE
Urine Glucose: >=1000 — AB
Urobilinogen, UA: 0.2 (ref 0.0–1.0)
WBC, UA: NONE SEEN (ref 0–?)
pH: 5.5 (ref 5.0–8.0)

## 2023-11-16 MED ORDER — POLYSACCHARIDE IRON COMPLEX 150 MG PO CAPS
150.0000 mg | ORAL_CAPSULE | Freq: Every day | ORAL | 1 refills | Status: DC
  Filled 2023-11-16: qty 90, 90d supply, fill #0
  Filled 2024-02-21: qty 90, 90d supply, fill #1

## 2023-11-16 MED ORDER — LEVOTHYROXINE SODIUM 125 MCG PO TABS
125.0000 ug | ORAL_TABLET | Freq: Every day | ORAL | 3 refills | Status: AC
  Filled 2023-11-16: qty 90, 90d supply, fill #0
  Filled 2024-02-21: qty 90, 90d supply, fill #1
  Filled 2024-06-03: qty 7, 7d supply, fill #2
  Filled 2024-06-03: qty 83, 83d supply, fill #2
  Filled 2024-06-03: qty 90, 90d supply, fill #2
  Filled 2024-08-28: qty 90, 90d supply, fill #3

## 2023-11-17 ENCOUNTER — Other Ambulatory Visit (HOSPITAL_COMMUNITY): Payer: Self-pay

## 2023-11-17 ENCOUNTER — Encounter: Payer: Self-pay | Admitting: Pharmacist

## 2023-11-17 ENCOUNTER — Telehealth: Payer: Self-pay | Admitting: Pharmacist

## 2023-11-17 ENCOUNTER — Ambulatory Visit: Payer: Managed Care, Other (non HMO) | Admitting: Internal Medicine

## 2023-11-17 ENCOUNTER — Other Ambulatory Visit: Payer: Self-pay

## 2023-11-17 NOTE — Telephone Encounter (Signed)
 Pharmacy Patient Advocate Encounter   Received notification from Patient Pharmacy that prior authorization for Mounjaro  2.5MG /0.5ML auto-injectors is required/requested.   Insurance verification completed.   The patient is insured through [employer] .   Per test claim: PA required; PA started via CoverMyMeds. KEY [id number] . Waiting for patient to confirm address on file with her insurance company.

## 2023-11-17 NOTE — Telephone Encounter (Signed)
 Address on file with insurance was confirmed by patient and the PA has been submitted.

## 2023-11-19 ENCOUNTER — Encounter: Payer: Self-pay | Admitting: Internal Medicine

## 2023-11-19 DIAGNOSIS — J069 Acute upper respiratory infection, unspecified: Secondary | ICD-10-CM | POA: Insufficient documentation

## 2023-11-19 NOTE — Assessment & Plan Note (Signed)
 Lab Results  Component Value Date   HGBA1C 5.4 11/15/2023   With persistent uncontrolled weight, pt to continue current medical treatment farxiga  5 every day, but change ozempic  to mounjaro  2.5 mg weekly with intent to tritrate

## 2023-11-19 NOTE — Assessment & Plan Note (Signed)
Also for mucinex bid prn

## 2023-11-19 NOTE — Assessment & Plan Note (Signed)
 Lab Results  Component Value Date   TSH 0.01 (L) 11/15/2023   overcontrolled pt to reduce levothyroxine  to 125 mcg qd

## 2023-11-19 NOTE — Assessment & Plan Note (Addendum)
 Mild, for doxycycline  100 bid, cough med prn, to f/u any worsening symptoms or concern, supportive care

## 2023-11-19 NOTE — Assessment & Plan Note (Signed)

## 2023-11-19 NOTE — Assessment & Plan Note (Signed)
Also for cxr

## 2023-11-19 NOTE — Assessment & Plan Note (Signed)
 Lab Results  Component Value Date   VITAMINB12 287 11/15/2023   Low, to start oral replacement - b12 1000 mcg qd

## 2023-11-19 NOTE — Assessment & Plan Note (Signed)
 Low, for GI referral, start iron  supplement

## 2023-11-19 NOTE — Assessment & Plan Note (Signed)
 BP Readings from Last 3 Encounters:  11/15/23 138/82  10/18/23 122/70  08/24/23 135/79   Stable, pt to continue medical treatment losartan  25 mg qd

## 2023-11-19 NOTE — Assessment & Plan Note (Signed)
 Lab Results  Component Value Date   CREATININE 1.04 11/15/2023   Stable overall, cont to avoid nephrotoxins

## 2023-11-19 NOTE — Assessment & Plan Note (Signed)
 Last vitamin D  Lab Results  Component Value Date   VD25OH 29.46 (L) 11/15/2023   Low, to start oral replacement

## 2023-11-19 NOTE — Assessment & Plan Note (Signed)
 Lab Results  Component Value Date   LDLCALC 55 11/15/2023   Stable, pt to continue current zetia  10 qd

## 2023-11-23 ENCOUNTER — Ambulatory Visit: Payer: Managed Care, Other (non HMO) | Admitting: Internal Medicine

## 2023-11-23 ENCOUNTER — Other Ambulatory Visit (HOSPITAL_COMMUNITY): Payer: Self-pay

## 2023-11-23 ENCOUNTER — Encounter (HOSPITAL_BASED_OUTPATIENT_CLINIC_OR_DEPARTMENT_OTHER): Payer: Self-pay

## 2023-11-23 NOTE — Telephone Encounter (Signed)
Pharmacy Patient Advocate Encounter  Received notification from [employer] that Prior Authorization for Mounjaro 2.5MG /0.5ML auto-injectors has been APPROVED from 11/17/2023 to 11/20/2024. Ran test claim, Copay is $20.00. This test claim was processed through [facility]- copay amounts may vary at other pharmacies due to pharmacy/plan contracts, or as the patient moves through the different stages of their insurance plan.   PA #/Case ID/Reference #: [id number]

## 2023-11-24 ENCOUNTER — Other Ambulatory Visit (HOSPITAL_COMMUNITY): Payer: Self-pay

## 2023-11-24 ENCOUNTER — Ambulatory Visit (HOSPITAL_BASED_OUTPATIENT_CLINIC_OR_DEPARTMENT_OTHER): Payer: Managed Care, Other (non HMO) | Admitting: Cardiovascular Disease

## 2023-11-24 VITALS — BP 112/80 | HR 78 | Ht 63.5 in | Wt 197.0 lb

## 2023-11-24 DIAGNOSIS — I5032 Chronic diastolic (congestive) heart failure: Secondary | ICD-10-CM

## 2023-11-24 DIAGNOSIS — E7800 Pure hypercholesterolemia, unspecified: Secondary | ICD-10-CM

## 2023-11-24 DIAGNOSIS — I10 Essential (primary) hypertension: Secondary | ICD-10-CM | POA: Diagnosis not present

## 2023-11-24 DIAGNOSIS — I480 Paroxysmal atrial fibrillation: Secondary | ICD-10-CM | POA: Diagnosis not present

## 2023-11-24 DIAGNOSIS — I70212 Atherosclerosis of native arteries of extremities with intermittent claudication, left leg: Secondary | ICD-10-CM | POA: Diagnosis not present

## 2023-11-24 DIAGNOSIS — G4733 Obstructive sleep apnea (adult) (pediatric): Secondary | ICD-10-CM

## 2023-11-24 NOTE — Progress Notes (Signed)
 Cardiology Office Note:  .   Date:  12/13/2023  ID:  Suzanne Hansen, DOB [birth year], MRN [id number] PCP: Corwin Levins, MD  [facility] Providers Cardiologist:  None    History of Present Illness: .    Suzanne Hansen is a 68 y.o. 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  She was first seen in 2016 when she was diagnosed with acute systolic and diastolic heart failure.  Prior to that she had a stress Myoview 09/2013 that revealed LVEF 48% and no ischemia.  She was followed by her primary care physician and switch from hydrochlorothiazide to Lasix.  She had no symptoms at the time she first saw Dr. [name] in 2016.  She was started on carvedilol and lisinopril. Lisinopril was subsequently converted to losartan due to cough. Her symptoms were felt to be nonischemic. She notes that she was under a lot of stress at the time.  She subsequently transferred her care to [facility] with Dr. [name].  He underwent CPX testing in 2018 and was found to have moderate cardiac limitation and deconditioning.  Echo 01/2017 revealed LVEF 50-55%.  Her most recent echo 05/2018 revealed LVEF 55 to 60% with grade 1 diastolic dysfunction.  She reportedly has a history of atrial fibrillation that occurred in the setting of her initial heart failure symptoms.  She has not had any recurrence and has never been on anticoagulation.  She was symptomatic in atrial fibrillation.   Suzanne Hansen had a spinal fusion in December 2020.  Her back pain has been better but still troubles her at times.  It makes it difficult for her to exercise.  After her surgery she had some issues with bladder incontinence and lowering the dose of her diuretic seems to help this as well.  She last saw Dr. [name] on 10/15/2020 and was started on Zetia.  She hasn't picked it up yet.  She  wasn't tolerating statins due to muscle pain.  She didn't tolerate it even every other day.  At her last appointment, she was doing well. She was seen by cardiology at [facility], [place] 05/2021 for evaluation of LE edema. She also reported exertional dyspnea and was unsure if it was asthma or heart failure.She had an echo which revealed an LVEF 60-65%. Her atrial pressure was . Spironolactone was added. He thought her swelling was more related to lymphedema than heart failure. BNP was not elevated.  At her visit 05/2022 she was working to increase her exercise.  Echo revealed LVEF 54% with grade 1 diastolic dysfunction.  Suzanne Hansen notes that her last episode of AF occurred several months ago. She describes the sensation as an 'electrical shock' and notes that these episodes are brief, not lasting over 30 minutes. She is currently taking aspirin as a precaution. No recent episodes lasting over 30 minutes.  She experiences chest pain occasionally, which occurs both while sitting and standing, but not during exercise. Coronary arteries were previously evaluated and found to be normal. She manages her symptoms with over-the-counter medications. No recent episodes of chest pain during exercise.  Her levothyroxine was recently adjusted from 137 mcg to 125 mcg.  She experienced hot flashes, which she attributes to her thyroid medication being off balance.  She mentions occasional swelling, particularly when traveling, and uses torsemide as needed. She also takes potassium supplements. She has not  noticed any swelling recently and uses compression socks when traveling.  Her current medications include levothyroxine, losartan, Zetia, Mounjaro, torsemide, and [place]. She takes levothyroxine 30 minutes before other medications and is considering taking iron supplements at night.   ROS:  As per HPI  Studies Reviewed: [name]   EKG Interpretation Date/Time:  Friday November 24 2023 08:31:28 EST Ventricular Rate:  78 PR  Interval:  130 QRS Duration:  90 QT Interval:  398 QTC Calculation: 453 R Axis:   -19  Text Interpretation: Normal sinus rhythm Minimal voltage criteria for LVH, may be normal variant ( R in aVL ) Since last tracing Rate slower Confirmed by Chilton Si ([id number]) on 11/24/2023 8:36:43 AM   Echo 06/2022:  1. Left ventricular ejection fraction by 3D volume is 54 %. The left  ventricle has normal function. The left ventricle has no regional wall  motion abnormalities. There is mild asymmetric left ventricular  hypertrophy of the infero-lateral segment. Left  ventricular diastolic parameters are consistent with Grade I diastolic  dysfunction (impaired relaxation).   2. Right ventricular systolic function is normal. The right ventricular  size is normal. There is normal pulmonary artery systolic pressure. The  estimated right ventricular systolic pressure is 30.7 mmHg.   3. The mitral valve is normal in structure. Trivial mitral valve  regurgitation. No evidence of mitral stenosis.   4. The aortic valve is tricuspid. Aortic valve regurgitation is not  visualized. No aortic stenosis is present.   5. The inferior vena cava is normal in size with greater than 50%  respiratory variability, suggesting right atrial pressure of 3 mmHg.   Risk Assessment/Calculations:    CHA2DS2-VASc Score = 3   This indicates a 3.2% annual risk of stroke. The patient's score is based upon: CHF History: 0 HTN History: 1 Diabetes History: 0 Stroke History: 0 Vascular Disease History: 0 Age Score: 1 Gender Score: 1       Physical Exam:   VS:  BP 112/80 (BP Location: Left Arm, Patient Position: Sitting, Cuff Size: Normal)   Pulse 78   Ht 5' 3.5" (1.613 m)   Wt 197 lb (89.4 kg)   SpO2 98%   BMI 34.35 kg/m  , BMI Body mass index is 34.35 kg/m. GENERAL:  Well appearing HEENT: Pupils equal round and reactive, fundi not visualized, oral mucosa unremarkable NECK:  No jugular venous distention, waveform  within normal limits, carotid upstroke brisk and symmetric, no bruits, no thyromegaly LUNGS:  Clear to auscultation bilaterally HEART:  RRR.  PMI not displaced or sustained,S1 and S2 within normal limits, no S3, no S4, no clicks, no rubs, no murmurs ABD:  Flat, positive bowel sounds normal in frequency in pitch, no bruits, no rebound, no guarding, no midline pulsatile mass, no hepatomegaly, no splenomegaly EXT:  2 plus pulses throughout, no edema, no cyanosis no clubbing SKIN:  No rashes no nodules NEURO:  Cranial nerves II through XII grossly intact, motor grossly intact throughout PSYCH:  Cognitively intact, oriented to person place and time   ASSESSMENT AND PLAN: .    # Intermittent Chest Pain Occasional chest pain at rest and standing, not associated with exertion. Coronary arteries previously found to be clear. in 2023.   Possible acid reflux or musculoskeletal cause. -Continue to monitor symptoms.  # Paroxysmal Atrial Fibrillation Infrequent episodes, last episode several months ago, duration less than 30 minutes. -Continue daily aspirin for stroke prevention. -Consider obtaining a KardiaMobile device for at-home EKG monitoring during episodes.  #  Hypothyroidism Recent adjustment of levothyroxine dosage due to elevated levels. -Continue levothyroxine 125mg  daily. -Monitor for symptoms of hyperthyroidism.  # Hyperlipidemia LDL and total cholesterol well-controlled. -Continue Zetia as prescribed.  # Hypertension Well-controlled with home readings around 118-120. -Continue Losartan as prescribed.  # Fluid Retention Occasional swelling, particularly with travel. -Consider use of compression socks during travel. -Continue torsemide as needed.  General Health Maintenance -Encouraged to resume exercise regimen as tolerated, particularly water aerobics. -Continue albuterol as needed for respiratory symptoms. -Follow-up in 1 year, or sooner if any issues arise.       Signed, Chilton Si, MD

## 2023-11-24 NOTE — Patient Instructions (Addendum)
Medication Instructions:  Your physician recommends that you continue on your current medications as directed. Please refer to the Current Medication list given to you today.  *If you need a refill on your cardiac medications before your next appointment, please call your pharmacy*  Lab Work: NONE  Testing/Procedures: NONE  Follow-Up: At [facility], you and your health needs are our priority.  As part of our continuing mission to provide you with exceptional heart care, we have created designated Provider Care Teams.  These Care Teams include your primary Cardiologist (physician) and Advanced Practice Providers (APPs -  Physician Assistants and Nurse Practitioners) who all work together to provide you with the care you need, when you need it.  We recommend signing up for the patient portal called "MyChart".  Sign up information is provided on this After Visit Summary.  MyChart is used to connect with patients for Virtual Visits (Telemedicine).  Patients are able to view lab/test results, encounter notes, upcoming appointments, etc.  Non-urgent messages can be sent to your provider as well.   To learn more about what you can do with MyChart, go to [email or web address].    Your next appointment:   12 month(s)  The format for your next appointment:   In Person  Provider:   DR [facility] OR Ronn Melena NP      Consider getting a Lourena Simmonds mobile to check your heart rhythm.

## 2023-11-27 ENCOUNTER — Other Ambulatory Visit (HOSPITAL_COMMUNITY): Payer: Self-pay

## 2023-11-27 ENCOUNTER — Telehealth: Payer: Self-pay | Admitting: Internal Medicine

## 2023-11-27 MED ORDER — FLUCONAZOLE 150 MG PO TABS
ORAL_TABLET | ORAL | 1 refills | Status: DC
  Filled 2023-11-27: qty 2, 3d supply, fill #0
  Filled 2024-02-22: qty 2, 3d supply, fill #1

## 2023-11-27 NOTE — Telephone Encounter (Signed)
 Ok done erx

## 2023-11-27 NOTE — Telephone Encounter (Signed)
Copied from CRM [id number]. Topic: Clinical - Medical Advice >> Nov 27, 2023 11:50 AM Shelbie Proctor wrote: Reason for CRM: Patient [phone] had two rounds of antibotics and is having a yeast infection and wants medication sent to [facility] [address] [place] [address] Phone:[phone]:[phone]

## 2023-11-30 ENCOUNTER — Other Ambulatory Visit (HOSPITAL_COMMUNITY): Payer: Self-pay

## 2023-11-30 ENCOUNTER — Encounter: Payer: Self-pay | Admitting: Internal Medicine

## 2023-12-07 ENCOUNTER — Encounter: Payer: Self-pay | Admitting: Internal Medicine

## 2023-12-07 ENCOUNTER — Other Ambulatory Visit (HOSPITAL_COMMUNITY): Payer: Self-pay

## 2023-12-07 MED ORDER — TIRZEPATIDE 5 MG/0.5ML ~~LOC~~ SOAJ
5.0000 mg | SUBCUTANEOUS | 3 refills | Status: DC
  Filled 2023-12-07: qty 6, 84d supply, fill #0

## 2023-12-13 ENCOUNTER — Encounter (HOSPITAL_BASED_OUTPATIENT_CLINIC_OR_DEPARTMENT_OTHER): Payer: Self-pay | Admitting: Cardiovascular Disease

## 2023-12-14 ENCOUNTER — Encounter: Payer: Self-pay | Admitting: Pulmonary Disease

## 2023-12-14 ENCOUNTER — Other Ambulatory Visit: Payer: Self-pay

## 2023-12-14 ENCOUNTER — Other Ambulatory Visit (HOSPITAL_COMMUNITY): Payer: Self-pay

## 2023-12-15 ENCOUNTER — Telehealth: Payer: Self-pay

## 2023-12-15 ENCOUNTER — Other Ambulatory Visit (HOSPITAL_COMMUNITY): Payer: Self-pay

## 2023-12-15 NOTE — Telephone Encounter (Signed)
 Message sent to PA team.

## 2023-12-15 NOTE — Telephone Encounter (Signed)
 Copied from CRM [id number]. Topic: Clinical - Prescription Issue >> Dec 15, 2023  8:50 AM Dimitri Ped wrote: Reason for CRM: patient is calling cause the pharmacy say she needs a prior authorization for dapagliflozin propanediol (FARXIGA) 5 MG TABS tablet And to check and see has doctor received the prior authorization form . Pharmacy told patient to call and see about any samples too . Please reach out to patient  [phone]

## 2023-12-18 ENCOUNTER — Other Ambulatory Visit (HOSPITAL_COMMUNITY): Payer: Self-pay

## 2023-12-18 ENCOUNTER — Telehealth: Payer: Self-pay | Admitting: Pharmacy Technician

## 2023-12-18 NOTE — Telephone Encounter (Signed)
 PA request has been Submitted. New Encounter has been or will be created for follow up. For additional info see Pharmacy Prior Auth telephone encounter from 12/18/23.

## 2023-12-18 NOTE — Telephone Encounter (Signed)
 Pharmacy Patient Advocate Encounter   Received notification from CoverMyMeds that prior authorization for [facility] 5MG  is required/requested.   Insurance verification completed.   The patient is insured through [employer] .   Per test claim: [id number] Submitted and pending

## 2023-12-19 ENCOUNTER — Other Ambulatory Visit (HOSPITAL_COMMUNITY): Payer: Self-pay

## 2023-12-19 NOTE — Telephone Encounter (Signed)
 Copied from CRM [id number]. Topic: Clinical - Prescription Issue >> Dec 15, 2023  8:50 AM Dimitri Ped wrote: Reason for CRM: patient is calling cause the pharmacy say she needs a prior authorization for dapagliflozin propanediol (FARXIGA) 5 MG TABS tablet And to check and see has doctor received the prior authorization form . Pharmacy told patient to call and see about any samples too . Please reach out to patient  [phone] >> Dec 19, 2023 11:08 AM Isabell A wrote: Patient is calling to check the status of this prior authorization - would like a call back or message sent through MyChart.

## 2023-12-20 ENCOUNTER — Other Ambulatory Visit (HOSPITAL_COMMUNITY): Payer: Self-pay

## 2023-12-20 NOTE — Telephone Encounter (Signed)
 Copied from CRM [id number]. Topic: Clinical - Prescription Issue >> Dec 20, 2023 11:21 AM Adaysia C wrote: Reason for CRM: Patients prior authorization for dapagliflozin propanediol (FARXIGA) 5 MG TABS tablet was denied by her [employer] due to lack of information from the provider; Patient has been with out this medication for a week; Please resubmit prior authorization with the updated information Patients insurance has requested a prior authorization for the [facility] 160-4.5 MCG/ACT inhaler; Please submit the prior authorization to the patients insurance because she has been with out it for 3 months Patient has requested a follow up call after this request has been completed #[phone]

## 2023-12-20 NOTE — Telephone Encounter (Signed)
 Pharmacy Patient Advocate Encounter  Received notification from [employer] that Prior Authorization for FARXIGA 5MG  has been APPROVED from 03/12//25 to 09/2098. Ran test claim, Copay is $0.00. This test claim was processed through [facility]- copay amounts may vary at other pharmacies due to pharmacy/plan contracts, or as the patient moves through the different stages of their insurance plan.   PA #/Case ID/Reference #: [id number]  SYMBICORT IS A PLAN BENEFIT/EXCLUSION IF THE PT HAS ALREADY TRIED AND FAILED 1 OR MORE CORTICOSTEROIDS PLEASE ADVISE

## 2023-12-21 ENCOUNTER — Other Ambulatory Visit (HOSPITAL_COMMUNITY): Payer: Self-pay

## 2023-12-21 NOTE — Telephone Encounter (Signed)
 Other phone note shows PA for farxiga has been approved.

## 2023-12-22 ENCOUNTER — Other Ambulatory Visit (HOSPITAL_COMMUNITY): Payer: Self-pay

## 2023-12-25 ENCOUNTER — Other Ambulatory Visit: Payer: Self-pay

## 2023-12-25 ENCOUNTER — Encounter: Payer: Self-pay | Admitting: Gastroenterology

## 2023-12-25 ENCOUNTER — Other Ambulatory Visit (INDEPENDENT_AMBULATORY_CARE_PROVIDER_SITE_OTHER)

## 2023-12-25 ENCOUNTER — Ambulatory Visit: Payer: Managed Care, Other (non HMO) | Admitting: Gastroenterology

## 2023-12-25 VITALS — BP 120/80 | HR 88 | Ht 63.0 in | Wt 200.2 lb

## 2023-12-25 DIAGNOSIS — K921 Melena: Secondary | ICD-10-CM | POA: Diagnosis not present

## 2023-12-25 DIAGNOSIS — Z860100 Personal history of colon polyps, unspecified: Secondary | ICD-10-CM | POA: Diagnosis not present

## 2023-12-25 DIAGNOSIS — E611 Iron deficiency: Secondary | ICD-10-CM

## 2023-12-25 DIAGNOSIS — K449 Diaphragmatic hernia without obstruction or gangrene: Secondary | ICD-10-CM | POA: Diagnosis not present

## 2023-12-25 LAB — CBC WITH DIFFERENTIAL/PLATELET
Basophils Absolute: 0 10*3/uL (ref 0.0–0.1)
Basophils Relative: 0.6 % (ref 0.0–3.0)
Eosinophils Absolute: 0.1 10*3/uL (ref 0.0–0.7)
Eosinophils Relative: 1.2 % (ref 0.0–5.0)
HCT: 42.3 % (ref 36.0–46.0)
Hemoglobin: 14.1 g/dL (ref 12.0–15.0)
Lymphocytes Relative: 42.8 % (ref 12.0–46.0)
Lymphs Abs: 3.2 10*3/uL (ref 0.7–4.0)
MCHC: 33.4 g/dL (ref 30.0–36.0)
MCV: 87.8 fl (ref 78.0–100.0)
Monocytes Absolute: 0.6 10*3/uL (ref 0.1–1.0)
Monocytes Relative: 7.6 % (ref 3.0–12.0)
Neutro Abs: 3.6 10*3/uL (ref 1.4–7.7)
Neutrophils Relative %: 47.8 % (ref 43.0–77.0)
Platelets: 308 10*3/uL (ref 150.0–400.0)
RBC: 4.81 Mil/uL (ref 3.87–5.11)
RDW: 14 % (ref 11.5–15.5)
WBC: 7.5 10*3/uL (ref 4.0–10.5)

## 2023-12-25 LAB — COMPREHENSIVE METABOLIC PANEL
ALT: 23 U/L (ref 0–35)
AST: 20 U/L (ref 0–37)
Albumin: 4.2 g/dL (ref 3.5–5.2)
Alkaline Phosphatase: 73 U/L (ref 39–117)
BUN: 13 mg/dL (ref 6–23)
CO2: 29 meq/L (ref 19–32)
Calcium: 9.7 mg/dL (ref 8.4–10.5)
Chloride: 104 meq/L (ref 96–112)
Creatinine, Ser: 1.28 mg/dL — ABNORMAL HIGH (ref 0.40–1.20)
GFR: 43.59 mL/min — ABNORMAL LOW (ref >60.00)
Glucose, Bld: 82 mg/dL (ref 70–99)
Potassium: 3.8 meq/L (ref 3.5–5.1)
Sodium: 143 meq/L (ref 135–145)
Total Bilirubin: 0.5 mg/dL (ref 0.2–1.2)
Total Protein: 6.8 g/dL (ref 6.0–8.3)

## 2023-12-25 LAB — B12 AND FOLATE PANEL
Folate: 16.3 ng/mL (ref >5.9)
Vitamin B-12: 330 pg/mL (ref 211–911)

## 2023-12-25 LAB — IBC + FERRITIN
Ferritin: 23.7 ng/mL (ref 10.0–291.0)
Iron: 42 ug/dL (ref 42–145)
Saturation Ratios: 10.2 % — ABNORMAL LOW (ref 20.0–50.0)
TIBC: 413 ug/dL (ref 250.0–450.0)
Transferrin: 295 mg/dL (ref 212.0–360.0)

## 2023-12-25 MED ORDER — SUFLAVE 178.7 G PO SOLR: 1.0000 | Freq: Once | ORAL | 0 refills | Status: AC

## 2023-12-25 NOTE — Patient Instructions (Addendum)
 Your provider has requested that you go to the basement level for lab work before leaving today. Press "B" on the elevator. The lab is located at the first door on the left as you exit the elevator.  You have been scheduled for an endoscopy and colonoscopy. Please follow the written instructions given to you at your visit today.  If you use inhalers (even only as needed), please bring them with you on the day of your procedure.  DO NOT TAKE 7 DAYS PRIOR TO TEST- Trulicity (dulaglutide) Ozempic, Wegovy (semaglutide) Mounjaro (tirzepatide) Bydureon Bcise (exanatide extended release)  DO NOT TAKE 1 DAY PRIOR TO YOUR TEST Rybelsus (semaglutide) Adlyxin (lixisenatide) Victoza (liraglutide) Byetta (exanatide) ___________________________________________________________________________  Bonita Quin will receive your bowel preparation through [facility], which ensures the lowest copay and home delivery, with outreach via text or call from an [phone] number. Please respond promptly to avoid rescheduling of your procedure. If you are interested in alternative options or have any questions regarding your prep, please contact them at [phone] ____________________________________________________________________________  Your Provider Has Sent Your Bowel Prep Regimen To [facility]   [facility] will contact you to verify your information and collect your copay, if applicable. Enjoy the comfort of your home while your prescription is mailed to you, FREE of any shipping charges.   [facility] accepts all major insurance benefits and applies discounts & coupons.  Have additional questions?   Chat: [email or web address] Call: [phone] Email: [email or web address] NCPDP: [id number]  How will [facility] contact you?  With a Welcome phone call,  a Welcome text and a checkout link in text form.  Texts you receive from [phone] Are NOT Spam.  *To set up delivery, you must complete the checkout  process via link or speak to one of the patient care representatives. If [facility] is unable to reach you, your prescription may be delayed.  To avoid long hold times on the phone, you may also utilize the secure chat feature on the [facility] website to request that they call you back for transaction completion or to expedite your concerns.  Due to recent changes in healthcare laws, you may see the results of your imaging and laboratory studies on MyChart before your provider has had a chance to review them.  We understand that in some cases there may be results that are confusing or concerning to you. Not all laboratory results come back in the same time frame and the provider may be waiting for multiple results in order to interpret others.  Please give [place] 48 hours in order for your provider to thoroughly review all the results before contacting the office for clarification of your results.   Thank you for trusting me with your gastrointestinal care!   Boone Master, PA

## 2023-12-25 NOTE — Progress Notes (Signed)
 Chief Complaint: Iron deficiency Primary GI MD: Dr. Lavon Paganini  HPI: 68 year old female history of CHF, hypothyroidism, diabetes, and others as listed below presents for evaluation of iron deficiency  Last seen in 2023 by Amy Esterwood PA-C.  Longstanding history of GERD previously controlled on PPI.   Discussed the use of AI scribe software for clinical note transcription with the patient, who gave verbal consent to proceed.  History of Present Illness   Suzanne Hansen is a 68 year old female who presents with low iron levels. She was referred by Dr. Moody Bruins due to low iron levels.  She has low iron levels with a normal blood count.  Iron 66, saturation 14%, ferritin 9.9.  There is no known bleeding or melena, although she recently recovered from a stomach virus during which her stools were 'awfully dark'. She attributes this to Pepto-Bismol use during the illness, which she initially took but then discontinued on a pharmacist's advice. She was not taking her iron supplements consistently during the stomach virus but has since resumed regular intake. Continues to have occasional melena but unsure if related to medication use.  She has a past diagnosis of a hiatal hernia discovered during a workup for potential weight loss surgery a few years ago. She recalls being told it was small at the time.  Her current medications include iron supplements, which she resumed after recovering from the stomach virus. She also takes aspirin regularly.   Her last colonoscopy had to be done at the hospital due to history of difficult intubation discovered by anesthesiologist at [facility]     PREVIOUS GI WORKUP   Patient had undergone colonoscopy with Dr. Lavon Paganini on 04/25/2022 with finding of 4 small sessile polyps measuring 3 to 5 mm, which were removed, there were a few diverticuli in the sigmoid and descending colon and nonbleeding internal and external hemorrhoids.  Biopsies revealed tubular adenomas..   recall 3 years    Past Medical History:  Diagnosis Date   ALLERGIC RHINITIS 05/09/2008   Allergy    ANXIETY 05/23/2007   ARM PAIN, LEFT 10/23/2009   Arthritis    ASTHMA 05/23/2007   Back pain    Cardiomyopathy (HCC) 09/08/2015   Cataract    Chest pain    CHF (congestive heart failure) (HCC)    Chronic diastolic heart failure (HCC) 01/22/2020   Constipation    CONTACT DERMATITIS 06/09/2009   Diabetes mellitus without complication (HCC)    type II   Dyspnea    allergies-dust, cig smoke, rag weeds   ENDOMETRIOSIS NOS 05/23/2007   FACIAL PAIN 06/02/2010   Food allergy    FREQUENCY, URINARY 05/17/2010   GERD (gastroesophageal reflux disease)    GLUCOSE INTOLERANCE 08/28/2009   HERPES SIMPLEX, UNCOMPLICATED 05/23/2007   Hyperlipidemia 08/28/2015   HYPERTHYROIDISM 05/23/2007   Hypoactive thyroid    HYPOTHYROIDISM 05/09/2008   Impaired glucose tolerance 01/28/2011   INSOMNIA, HX OF 05/23/2007   Joint pain    LATERAL EPICONDYLITIS, RIGHT 03/10/2009   LIPOMA 10/22/2010   NECK PAIN 11/10/2010   OBESITY 05/23/2007   Osteopenia 09/28/2018   Plantar fasciitis    Both feet   Pre-diabetes    SHOULDER PAIN, LEFT 05/17/2010   SINUSITIS- ACUTE-NOS 11/03/2008   SKIN LESION 11/10/2010   Sleep apnea    Stage III chronic kidney disease (HCC)    Stage III   Unspecified Chest Pain 07/25/2008   UNSPECIFIED URTICARIA 06/04/2009   URI 08/28/2009   UTI 04/29/2008   VITAMIN D DEFICIENCY  08/28/2009   Wheezing 07/12/2010    Past Surgical History:  Procedure Laterality Date   COLONOSCOPY     COLONOSCOPY WITH PROPOFOL N/A 04/25/2022   Procedure: COLONOSCOPY WITH PROPOFOL;  Surgeon: Napoleon Form, MD;  Location: [facility];  Service: Gastroenterology;  Laterality: N/A;   ENDOMETRIAL ABLATION     LAMINECTOMY  1996   LAPAROTOMY     exploratory   lower back surgery  07/06/2017   cyst   POLYPECTOMY     POLYPECTOMY  04/25/2022   Procedure: POLYPECTOMY;  Surgeon: Napoleon Form, MD;  Location: [facility];  Service: Gastroenterology;;   s/p endometrial ablation  12/06   TUBAL LIGATION     UTERINE FIBROID SURGERY     WISDOM TOOTH EXTRACTION      Current Outpatient Medications  Medication Sig Dispense Refill   acetaminophen (TYLENOL) 650 MG CR tablet Take by mouth.     albuterol (VENTOLIN HFA) 108 (90 Base) MCG/ACT inhaler Inhale 2 puffs into the lungs every 4 (four) hours as needed. 6.7 g 11   allopurinol (ZYLOPRIM) 100 MG tablet Take 2 tablets (200 mg total) by mouth daily. 180 tablet 3   aspirin 81 MG EC tablet Take 81 mg by mouth daily.     Cholecalciferol (VITAMIN D-1000 MAX ST) 25 MCG (1000 UT) tablet Take by mouth.     dapagliflozin propanediol (FARXIGA) 5 MG TABS tablet Take 1 tablet (5 mg total) by mouth daily before breakfast. 90 tablet 3   estradiol (ESTRACE) 0.1 MG/GM vaginal cream Place 1 Applicatorful vaginally once a week.     ezetimibe (ZETIA) 10 MG tablet Take 1 tablet (10 mg total) by mouth daily. 90 tablet 3   fexofenadine (ALLEGRA) 180 MG tablet Take 180 mg by mouth daily.     gabapentin (NEURONTIN) 100 MG capsule Take 1 capsule (100 mg total) by mouth 2 (two) times daily. 60 capsule 1   iron polysaccharides (NU-IRON) 150 MG capsule Take 1 capsule (150 mg total) by mouth daily. 90 capsule 1   levothyroxine (SYNTHROID) 125 MCG tablet Take 1 tablet (125 mcg total) by mouth daily. 90 tablet 3   losartan (COZAAR) 25 MG tablet Take 1/2 tablet by mouth daily. NEED APPOINTMENT 50 tablet 0   magnesium oxide (MAG-OX) 400 MG tablet Take 400 mg by mouth daily.     metroNIDAZOLE (METROCREAM) 0.75 % cream Apply topically.     pantoprazole (PROTONIX) 40 MG tablet Take 1 tablet (40 mg total) by mouth daily. Patient needs follow up appointment for future refills. Please call [phone] to schedule an appointment. 90 tablet 0   PEG 3350-KCl-NaCl-NaSulf-MgSul (SUFLAVE) 178.7 g SOLR Take 1 kit by mouth once for 1 dose. 1 each 0   potassium chloride SA  (KLOR-CON M) 20 MEQ tablet Take 1 tablet (20 mEq total) by mouth daily. OK to take extra on days you take extra torsemide. (Patient taking differently: Take 20 mEq by mouth as needed.) 180 tablet 2   tirzepatide (MOUNJARO) 5 MG/0.5ML Pen Inject 5 mg into the skin once a week. 6 mL 3   torsemide (DEMADEX) 20 MG tablet Take 3 tablets (60 mg total) by mouth daily. (Patient taking differently: Take 60 mg by mouth as needed.) 270 tablet 1   fluconazole (DIFLUCAN) 150 MG tablet Take 1 tablet by mouth every 3 days as needed (Patient not taking: Reported on 12/25/2023) 2 tablet 1   SYMBICORT 160-4.5 MCG/ACT inhaler Inhale 2 puffs into the lungs in the morning and  at bedtime. (Patient not taking: Reported on 12/25/2023) 10.2 g 0   No current facility-administered medications for this visit.    Allergies as of 12/25/2023 - Review Complete 12/25/2023  Allergen Reaction Noted   Shellfish allergy Anaphylaxis 08/09/2018   Clarithromycin Nausea And Vomiting 09/09/2015   Latex Itching and Other (See Comments) 01/02/2013   Metronidazole Nausea And Vomiting 05/23/2007   Zostavax [zoster vaccine live]  09/27/2017   Adhesive [tape] Dermatitis 09/02/2019   Amiloride  01/13/2021   Clindamycin Nausea Only and Nausea And Vomiting 04/19/2022   Gadobenate  01/13/2021   Lisinopril Cough 08/09/2018    Family History  Problem Relation Age of Onset   Heart disease Mother        Rheumatic heart disease   Depression Mother    COPD Father    Hypertension Father    Diabetes Sister    Kidney disease Sister    Cancer Maternal Grandmother        Breast   Diabetes Cousin    Hypertension Cousin    Heart disease Cousin    Colon cancer Neg Hx    Esophageal cancer Neg Hx    Rectal cancer Neg Hx    Stomach cancer Neg Hx     Social History   Socioeconomic History   Marital status: Single    Spouse name: Not on file   Number of children: 3   Years of education: Not on file   Highest education level: Master's  degree (e.g., MA, MS, MEng, MEd, MSW, MBA)  Occupational History   Occupation: SOCIAL WORKER    Employer: [employer]    Comment: [place]   Occupation: Advertising account executive for preschool infants  Tobacco Use   Smoking status: Former    Current packs/day: 0.00    Average packs/day: 0.5 packs/day for 12.0 years (6.0 ttl pk-yrs)    Types: Cigarettes    Start date: 11/05/1975    Quit date: 11/05/1987    Years since quitting: 36.1   Smokeless tobacco: Never  Vaping Use   Vaping status: Never Used  Substance and Sexual Activity   Alcohol use: Yes    Alcohol/week: 1.0 standard drink of alcohol    Types: 1 Glasses of wine per week    Comment: WINE   Drug use: No   Sexual activity: Not on file  Other Topics Concern   Not on file  Social History Narrative   Not on file   Social Drivers of Health   Financial Resource Strain: Low Risk  (10/14/2023)   Overall Financial Resource Strain (CARDIA)    Difficulty of Paying Living Expenses: Not hard at all  Food Insecurity: No Food Insecurity (10/14/2023)   Hunger Vital Sign    Worried About Running Out of Food in the Last Year: Never true    Ran Out of Food in the Last Year: Never true  Transportation Needs: No Transportation Needs (10/14/2023)   PRAPARE - Administrator, Civil Service (Medical): No    Lack of Transportation (Non-Medical): No  Physical Activity: Sufficiently Active (10/14/2023)   Exercise Vital Sign    Days of Exercise per Week: 3 days    Minutes of Exercise per Session: 60 min  Stress: No Stress Concern Present (10/14/2023)   [employer] of Occupational Health - Occupational Stress Questionnaire    Feeling of Stress : Not at all  Social Connections: Moderately Integrated (10/14/2023)   Social Connection and Isolation Panel [NHANES]    Frequency  of Communication with Friends and Family: More than three times a week    Frequency of Social Gatherings with Friends and Family: More than three times a week     Attends Religious Services: More than 4 times per year    Active Member of [employer] or Organizations: Yes    Attends Banker Meetings: More than 4 times per year    Marital Status: Divorced  Intimate Partner Violence: Unknown (01/13/2022)   Received from [employer], [facility]   HITS    Physically Hurt: Not on file    Insult or Talk Down To: Not on file    Threaten Physical Harm: Not on file    Scream or Curse: Not on file    Review of Systems:    Constitutional: No weight loss, fever, chills, weakness or fatigue HEENT: Eyes: No change in vision               Ears, Nose, Throat:  No change in hearing or congestion Skin: No rash or itching Cardiovascular: No chest pain, chest pressure or palpitations   Respiratory: No SOB or cough Gastrointestinal: See HPI and otherwise negative Genitourinary: No dysuria or change in urinary frequency Neurological: No headache, dizziness or syncope Musculoskeletal: No new muscle or joint pain Hematologic: No bleeding or bruising Psychiatric: No history of depression or anxiety    Physical Exam:  Vital signs: BP 120/80 (BP Location: Left Arm, Patient Position: Sitting, Cuff Size: Large)   Pulse 88   Ht 5\' 3"  (1.6 m)   Wt 200 lb 4 oz (90.8 kg)   BMI 35.47 kg/m   Constitutional: NAD, Well developed, Well nourished, alert and cooperative Head:  Normocephalic and atraumatic. Eyes:   PEERL, EOMI. No icterus. Conjunctiva pink. Respiratory: Respirations even and unlabored. Lungs clear to auscultation bilaterally.   No wheezes, crackles, or rhonchi.  Cardiovascular:  Regular rate and rhythm. No peripheral edema, cyanosis or pallor.  Gastrointestinal:  Soft, nondistended, nontender. No rebound or guarding. Normal bowel sounds. No appreciable masses or hepatomegaly. Rectal:  Not performed.  Msk:  Symmetrical without gross deformities. Without edema, no deformity or joint abnormality.  Neurologic:  Alert and  oriented x4;  grossly  normal neurologically.  Skin:   Dry and intact without significant lesions or rashes. Psychiatric: Oriented to person, place and time. Demonstrates good judgement and reason without abnormal affect or behaviors.   RELEVANT LABS AND IMAGING: CBC    Component Value Date/Time   WBC 8.2 11/15/2023 1542   RBC 5.03 11/15/2023 1542   HGB 14.5 11/15/2023 1542   HGB 13.5 10/20/2021 1001   HCT 45.0 11/15/2023 1542   HCT 40.8 10/20/2021 1001   PLT 272.0 11/15/2023 1542   PLT 330 10/20/2021 1001   MCV 89.5 11/15/2023 1542   MCV 90 10/20/2021 1001   MCH 29.7 10/20/2021 1001   MCH 29.5 06/29/2020 1346   MCHC 32.2 11/15/2023 1542   RDW 14.7 11/15/2023 1542   RDW 13.9 10/20/2021 1001   LYMPHSABS 3.1 11/15/2023 1542   LYMPHSABS 3.0 10/20/2021 1001   MONOABS 0.8 11/15/2023 1542   EOSABS 0.1 11/15/2023 1542   EOSABS 0.1 10/20/2021 1001   BASOSABS 0.0 11/15/2023 1542   BASOSABS 0.1 10/20/2021 1001    CMP     Component Value Date/Time   NA 141 11/15/2023 1542   NA 142 06/28/2022 1500   K 3.6 11/15/2023 1542   CL 106 11/15/2023 1542   CO2 28 11/15/2023 1542   GLUCOSE  64 (L) 11/15/2023 1542   BUN 13 11/15/2023 1542   BUN 20 06/28/2022 1500   CREATININE 1.04 11/15/2023 1542   CREATININE 0.97 09/17/2015 1113   CALCIUM 9.4 11/15/2023 1542   PROT 6.7 11/15/2023 1542   PROT 6.5 10/20/2021 1001   ALBUMIN 4.1 11/15/2023 1542   ALBUMIN 4.5 10/20/2021 1001   AST 18 11/15/2023 1542   ALT 18 11/15/2023 1542   ALKPHOS 81 11/15/2023 1542   BILITOT 0.5 11/15/2023 1542   BILITOT 0.5 10/20/2021 1001   GFRNONAA 38 (L) 09/03/2019 1335   GFRAA 44 (L) 09/03/2019 1335     Assessment/Plan:      Iron deficiency Melena Iron levels low without anemia. Possible causes: dietary factors, gastrointestinal blood loss. Hiatal hernia may contribute. Endoscopy needed to rule out bleeding sources like ulcers or Cameron's erosions. Questionable episodes of melena thought to be due to pepto-bismol versus iron  supplementation. No previous EGD. UTD on colonoscopy but due to needing hospital procedure and presence of iron deficiency would be best to do it early. - EGD/Colonoscopy at hospital for further evaluation. (History of difficulty intubation) - I thoroughly discussed the procedure with the patient (at bedside) to include nature of the procedure, alternatives, benefits, and risks (including but not limited to bleeding, infection, perforation, anesthesia/cardiac pulmonary complications).  Patient verbalized understanding and gave verbal consent to proceed with procedure.  - recheck labs today with CBC, CMP, and IBC/Ferritin  Hiatal hernia Previously diagnosed, potentially causing iron deficiency and bleeding. Endoscopy to assess current status and check for erosions. - Perform endoscopy to assess the hiatal hernia and check for Cameron's erosions.  Colonoscopy follow-up 2023 colonoscopy showed polyps, follow-up in 2026. Early colonoscopy reasonable due to iron deficiency to rule out lower gastrointestinal bleeding. Low risk of sedation issues and perforation. - Schedule colonoscopy with endoscopy to rule out lower gastrointestinal bleeding sources.   Lara Mulch [facility] 12/25/2023, 3:15 PM  Cc: Corwin Levins, MD

## 2023-12-27 ENCOUNTER — Telehealth: Payer: Self-pay

## 2023-12-27 ENCOUNTER — Other Ambulatory Visit (HOSPITAL_COMMUNITY): Payer: Self-pay

## 2023-12-27 MED ORDER — FLUTICASONE-SALMETEROL 250-50 MCG/ACT IN AEPB
1.0000 | INHALATION_SPRAY | Freq: Two times a day (BID) | RESPIRATORY_TRACT | 3 refills | Status: DC
  Filled 2023-12-27: qty 180, 90d supply, fill #0

## 2023-12-27 NOTE — Telephone Encounter (Signed)
 Pharmacy Patient Advocate Encounter   Received notification from Onbase that prior authorization for Symbicort 160-4.5MCG/ACT aerosol is required/requested.   Insurance verification completed.   The patient is insured through [employer] .   Per test claim:  Advair Diskus ($0.00), Dulera ($20.00), or Breo Ellipta ($20)  is preferred by the insurance.  If suggested medication is appropriate, Please send in a new RX and discontinue this one. If not, please advise as to why it's not appropriate so that we may request a Prior Authorization. Please note, some preferred medications may still require a PA.  If the suggested medications have not been trialed and there are no contraindications to their use, the PA will not be submitted, as it will not be approved.

## 2023-12-27 NOTE — Telephone Encounter (Signed)
 Ok to let pt know -   The symbicort is not covered, so I changed to the Advair 250 50 at twice per day as we are told this is Zero copay   thanks

## 2023-12-27 NOTE — Telephone Encounter (Signed)
 Called and let Pt know

## 2023-12-28 ENCOUNTER — Other Ambulatory Visit (HOSPITAL_COMMUNITY): Payer: Self-pay

## 2024-01-11 ENCOUNTER — Telehealth: Payer: Self-pay | Admitting: Gastroenterology

## 2024-01-11 NOTE — Telephone Encounter (Signed)
 Pt states she has IDA and is having bad headaches. She has been taking tylenol but wanted to know if there is something else she can take. States she cannot take nsaids due to kidney disease. She would also like a sooner procedure appt but procedure has to be done at the hospital. Please advise.

## 2024-01-11 NOTE — Telephone Encounter (Signed)
 Patient requesting to speak with nurse in regards to symptoms.

## 2024-01-12 NOTE — Telephone Encounter (Signed)
 Pt notified via mychart

## 2024-02-09 ENCOUNTER — Other Ambulatory Visit (HOSPITAL_COMMUNITY): Payer: Self-pay

## 2024-02-21 ENCOUNTER — Other Ambulatory Visit (HOSPITAL_COMMUNITY): Payer: Self-pay

## 2024-02-21 ENCOUNTER — Encounter: Payer: Self-pay | Admitting: Internal Medicine

## 2024-02-21 ENCOUNTER — Other Ambulatory Visit: Payer: Self-pay | Admitting: Internal Medicine

## 2024-02-21 ENCOUNTER — Other Ambulatory Visit: Payer: Self-pay

## 2024-02-21 ENCOUNTER — Other Ambulatory Visit: Payer: Self-pay | Admitting: Cardiovascular Disease

## 2024-02-21 MED ORDER — EZETIMIBE 10 MG PO TABS
10.0000 mg | ORAL_TABLET | Freq: Every day | ORAL | 3 refills | Status: AC
  Filled 2024-02-21: qty 90, 90d supply, fill #0
  Filled 2024-05-17: qty 90, 90d supply, fill #1
  Filled 2024-08-28: qty 90, 90d supply, fill #2

## 2024-02-21 MED ORDER — LOSARTAN POTASSIUM 25 MG PO TABS
12.5000 mg | ORAL_TABLET | Freq: Every day | ORAL | 3 refills | Status: AC
  Filled 2024-02-21: qty 45, 90d supply, fill #0
  Filled 2024-05-17: qty 45, 90d supply, fill #1
  Filled 2024-08-28: qty 45, 90d supply, fill #2

## 2024-02-21 MED ORDER — TIRZEPATIDE 7.5 MG/0.5ML ~~LOC~~ SOAJ
7.5000 mg | SUBCUTANEOUS | 3 refills | Status: DC
Start: 2024-02-21 — End: 2024-05-17
  Filled 2024-02-21: qty 6, 84d supply, fill #0
  Filled 2024-05-17: qty 6, 84d supply, fill #1

## 2024-02-22 ENCOUNTER — Other Ambulatory Visit (HOSPITAL_COMMUNITY): Payer: Self-pay

## 2024-02-22 ENCOUNTER — Other Ambulatory Visit: Payer: Self-pay

## 2024-02-23 ENCOUNTER — Other Ambulatory Visit (HOSPITAL_COMMUNITY): Payer: Self-pay

## 2024-02-23 ENCOUNTER — Other Ambulatory Visit: Payer: Self-pay

## 2024-03-01 ENCOUNTER — Other Ambulatory Visit (HOSPITAL_COMMUNITY): Payer: Self-pay

## 2024-03-07 ENCOUNTER — Other Ambulatory Visit: Payer: Self-pay

## 2024-03-11 ENCOUNTER — Telehealth: Payer: Self-pay | Admitting: Gastroenterology

## 2024-03-11 ENCOUNTER — Other Ambulatory Visit (HOSPITAL_COMMUNITY): Payer: Self-pay

## 2024-03-11 MED ORDER — SUFLAVE 178.7 G PO SOLR
1.0000 | Freq: Once | ORAL | 0 refills | Status: AC
  Filled 2024-03-11: qty 2, 1d supply, fill #0

## 2024-03-11 NOTE — Telephone Encounter (Signed)
 Inbound call from patient, would like prep medication for upcoming procedure 6/17 sent to [facility] .

## 2024-03-11 NOTE — Telephone Encounter (Signed)
 Suflave  sent to [facility]

## 2024-03-12 ENCOUNTER — Other Ambulatory Visit (HOSPITAL_COMMUNITY): Payer: Self-pay

## 2024-03-12 MED ORDER — DICLOFENAC SODIUM 2 % EX SOLN
2.0000 | Freq: Two times a day (BID) | CUTANEOUS | 0 refills | Status: DC
  Filled 2024-03-12 – 2024-03-16 (×3): qty 112, 30d supply, fill #0

## 2024-03-13 ENCOUNTER — Other Ambulatory Visit (HOSPITAL_COMMUNITY): Payer: Self-pay

## 2024-03-14 ENCOUNTER — Other Ambulatory Visit (HOSPITAL_COMMUNITY): Payer: Self-pay

## 2024-03-16 ENCOUNTER — Other Ambulatory Visit (HOSPITAL_COMMUNITY): Payer: Self-pay

## 2024-03-18 ENCOUNTER — Other Ambulatory Visit (HOSPITAL_COMMUNITY): Payer: Self-pay

## 2024-03-19 ENCOUNTER — Encounter (HOSPITAL_COMMUNITY): Payer: Self-pay | Admitting: Gastroenterology

## 2024-03-19 NOTE — Progress Notes (Signed)
 Attempted to obtain medical history for pre op call via telephone, unable to reach at this time. HIPAA compliant voicemail message left requesting return call to pre surgical testing department.

## 2024-03-20 ENCOUNTER — Other Ambulatory Visit (HOSPITAL_COMMUNITY): Payer: Self-pay

## 2024-03-21 ENCOUNTER — Other Ambulatory Visit (HOSPITAL_COMMUNITY): Payer: Self-pay

## 2024-03-22 ENCOUNTER — Telehealth: Payer: Self-pay

## 2024-03-22 NOTE — Telephone Encounter (Signed)
 Procedure:COLON Procedure date: 03/26/24 Procedure location: [facility] Arrival Time: 6:45 Spoke with the patient Y/N: Y Any prep concerns? N  Has the patient obtained the prep from the pharmacy ? Y Do you have a care partner and transportation: Y Any additional concerns? N

## 2024-03-26 ENCOUNTER — Ambulatory Visit (HOSPITAL_COMMUNITY)
Admission: RE | Admit: 2024-03-26 | Discharge: 2024-03-26 | Disposition: A | Discharge status: Home / Self Care | Attending: Gastroenterology | Admitting: Gastroenterology

## 2024-03-26 ENCOUNTER — Other Ambulatory Visit: Payer: Self-pay

## 2024-03-26 ENCOUNTER — Encounter (HOSPITAL_COMMUNITY)
Admission: RE | Disposition: A | Discharge status: Home / Self Care | Payer: Self-pay | Source: Home / Self Care | Attending: Gastroenterology

## 2024-03-26 ENCOUNTER — Ambulatory Visit (HOSPITAL_COMMUNITY): Admitting: Certified Registered"

## 2024-03-26 ENCOUNTER — Other Ambulatory Visit (HOSPITAL_COMMUNITY): Payer: Self-pay

## 2024-03-26 ENCOUNTER — Encounter (HOSPITAL_COMMUNITY): Payer: Self-pay | Admitting: Gastroenterology

## 2024-03-26 DIAGNOSIS — E039 Hypothyroidism, unspecified: Secondary | ICD-10-CM | POA: Insufficient documentation

## 2024-03-26 DIAGNOSIS — Z87891 Personal history of nicotine dependence: Secondary | ICD-10-CM | POA: Diagnosis not present

## 2024-03-26 DIAGNOSIS — K5730 Diverticulosis of large intestine without perforation or abscess without bleeding: Secondary | ICD-10-CM | POA: Diagnosis not present

## 2024-03-26 DIAGNOSIS — D120 Benign neoplasm of cecum: Secondary | ICD-10-CM | POA: Insufficient documentation

## 2024-03-26 DIAGNOSIS — K2950 Unspecified chronic gastritis without bleeding: Secondary | ICD-10-CM | POA: Diagnosis not present

## 2024-03-26 DIAGNOSIS — K3189 Other diseases of stomach and duodenum: Secondary | ICD-10-CM | POA: Diagnosis not present

## 2024-03-26 DIAGNOSIS — D509 Iron deficiency anemia, unspecified: Secondary | ICD-10-CM

## 2024-03-26 DIAGNOSIS — K921 Melena: Secondary | ICD-10-CM | POA: Diagnosis present

## 2024-03-26 DIAGNOSIS — Z79899 Other long term (current) drug therapy: Secondary | ICD-10-CM | POA: Insufficient documentation

## 2024-03-26 DIAGNOSIS — M1990 Unspecified osteoarthritis, unspecified site: Secondary | ICD-10-CM | POA: Diagnosis not present

## 2024-03-26 DIAGNOSIS — E611 Iron deficiency: Secondary | ICD-10-CM | POA: Diagnosis present

## 2024-03-26 DIAGNOSIS — K635 Polyp of colon: Secondary | ICD-10-CM | POA: Diagnosis not present

## 2024-03-26 DIAGNOSIS — K209 Esophagitis, unspecified without bleeding: Secondary | ICD-10-CM

## 2024-03-26 DIAGNOSIS — K2970 Gastritis, unspecified, without bleeding: Secondary | ICD-10-CM | POA: Diagnosis not present

## 2024-03-26 DIAGNOSIS — E1122 Type 2 diabetes mellitus with diabetic chronic kidney disease: Secondary | ICD-10-CM | POA: Diagnosis not present

## 2024-03-26 DIAGNOSIS — I130 Hypertensive heart and chronic kidney disease with heart failure and stage 1 through stage 4 chronic kidney disease, or unspecified chronic kidney disease: Secondary | ICD-10-CM | POA: Insufficient documentation

## 2024-03-26 DIAGNOSIS — Z7984 Long term (current) use of oral hypoglycemic drugs: Secondary | ICD-10-CM | POA: Insufficient documentation

## 2024-03-26 DIAGNOSIS — E1151 Type 2 diabetes mellitus with diabetic peripheral angiopathy without gangrene: Secondary | ICD-10-CM | POA: Diagnosis not present

## 2024-03-26 DIAGNOSIS — J45909 Unspecified asthma, uncomplicated: Secondary | ICD-10-CM | POA: Diagnosis not present

## 2024-03-26 DIAGNOSIS — Z7982 Long term (current) use of aspirin: Secondary | ICD-10-CM | POA: Insufficient documentation

## 2024-03-26 DIAGNOSIS — K644 Residual hemorrhoidal skin tags: Secondary | ICD-10-CM | POA: Diagnosis not present

## 2024-03-26 DIAGNOSIS — E669 Obesity, unspecified: Secondary | ICD-10-CM | POA: Diagnosis not present

## 2024-03-26 DIAGNOSIS — Z79890 Hormone replacement therapy (postmenopausal): Secondary | ICD-10-CM | POA: Insufficient documentation

## 2024-03-26 DIAGNOSIS — K648 Other hemorrhoids: Secondary | ICD-10-CM | POA: Insufficient documentation

## 2024-03-26 DIAGNOSIS — Z7951 Long term (current) use of inhaled steroids: Secondary | ICD-10-CM | POA: Insufficient documentation

## 2024-03-26 DIAGNOSIS — K2100 Gastro-esophageal reflux disease with esophagitis, without bleeding: Secondary | ICD-10-CM | POA: Insufficient documentation

## 2024-03-26 DIAGNOSIS — N183 Chronic kidney disease, stage 3 unspecified: Secondary | ICD-10-CM | POA: Diagnosis not present

## 2024-03-26 DIAGNOSIS — I5032 Chronic diastolic (congestive) heart failure: Secondary | ICD-10-CM | POA: Insufficient documentation

## 2024-03-26 DIAGNOSIS — Z7985 Long-term (current) use of injectable non-insulin antidiabetic drugs: Secondary | ICD-10-CM | POA: Insufficient documentation

## 2024-03-26 DIAGNOSIS — Z6834 Body mass index (BMI) 34.0-34.9, adult: Secondary | ICD-10-CM | POA: Diagnosis not present

## 2024-03-26 DIAGNOSIS — Z833 Family history of diabetes mellitus: Secondary | ICD-10-CM | POA: Insufficient documentation

## 2024-03-26 DIAGNOSIS — G4730 Sleep apnea, unspecified: Secondary | ICD-10-CM | POA: Diagnosis not present

## 2024-03-26 DIAGNOSIS — K259 Gastric ulcer, unspecified as acute or chronic, without hemorrhage or perforation: Secondary | ICD-10-CM

## 2024-03-26 HISTORY — PX: COLONOSCOPY: SHX5424

## 2024-03-26 HISTORY — PX: BIOPSY OF SKIN SUBCUTANEOUS TISSUE AND/OR MUCOUS MEMBRANE: SHX6741

## 2024-03-26 HISTORY — PX: ESOPHAGOGASTRODUODENOSCOPY: SHX5428

## 2024-03-26 HISTORY — PX: POLYPECTOMY: SHX149

## 2024-03-26 LAB — GLUCOSE, CAPILLARY
Glucose-Capillary: 79 mg/dL (ref 70–99)
Glucose-Capillary: 79 mg/dL (ref 70–99)

## 2024-03-26 SURGERY — COLONOSCOPY
Anesthesia: General

## 2024-03-26 MED ORDER — PROPOFOL 500 MG/50ML IV EMUL
INTRAVENOUS | Status: DC | PRN
  Administered 2024-03-26: 125 ug/kg/min via INTRAVENOUS

## 2024-03-26 MED ORDER — PANTOPRAZOLE SODIUM 40 MG PO TBEC
40.0000 mg | DELAYED_RELEASE_TABLET | Freq: Two times a day (BID) | ORAL | 3 refills | Status: AC
  Filled 2024-03-26: qty 180, 90d supply, fill #0
  Filled 2024-08-02: qty 180, 90d supply, fill #1

## 2024-03-26 MED ORDER — PROPOFOL 10 MG/ML IV BOLUS
INTRAVENOUS | Status: DC | PRN
  Administered 2024-03-26: 80 mg via INTRAVENOUS
  Administered 2024-03-26: 20 mg via INTRAVENOUS

## 2024-03-26 MED ORDER — SODIUM CHLORIDE 0.9 % IV SOLN: INTRAVENOUS | Status: DC

## 2024-03-26 MED ORDER — PANTOPRAZOLE SODIUM 40 MG PO TBEC
40.0000 mg | DELAYED_RELEASE_TABLET | Freq: Two times a day (BID) | ORAL | 3 refills | Status: DC
  Filled 2024-03-26: qty 90, 45d supply, fill #0

## 2024-03-26 MED ORDER — LIDOCAINE 2% (20 MG/ML) 5 ML SYRINGE
INTRAMUSCULAR | Status: DC | PRN
  Administered 2024-03-26: 100 mg via INTRAVENOUS

## 2024-03-26 MED ORDER — PROPOFOL 10 MG/ML IV BOLUS
INTRAVENOUS | Status: AC
  Filled 2024-03-26: qty 20

## 2024-03-26 NOTE — H&P (Signed)
 [facility] History and Physical   Primary Care Physician:  Roslyn Coombe, MD   Reason for Procedure:   Iron  deficiency anemia  Plan:    EGD and colonoscopy     HPI: Suzanne Hansen is a 68 y.o. female here for EGD and colonoscopy for iron  def anemia. The risks and benefits as well as alternatives of endoscopic procedure(s) have been discussed and reviewed. All questions answered. The patient agrees to proceed.    Past Medical History:  Diagnosis Date   ALLERGIC RHINITIS 05/09/2008   Allergy    ANXIETY 05/23/2007   ARM PAIN, LEFT 10/23/2009   Arthritis    ASTHMA 05/23/2007   Back pain    Cardiomyopathy (HCC) 09/08/2015   Cataract    Chest pain    CHF (congestive heart failure) (HCC)    Chronic diastolic heart failure (HCC) 01/22/2020   Constipation    CONTACT DERMATITIS 06/09/2009   Diabetes mellitus without complication (HCC)    type II   Dyspnea    allergies-dust, cig smoke, rag weeds   ENDOMETRIOSIS NOS 05/23/2007   FACIAL PAIN 06/02/2010   Food allergy    FREQUENCY, URINARY 05/17/2010   GERD (gastroesophageal reflux disease)    GLUCOSE INTOLERANCE 08/28/2009   HERPES SIMPLEX, UNCOMPLICATED 05/23/2007   Hyperlipidemia 08/28/2015   HYPERTHYROIDISM 05/23/2007   Hypoactive thyroid     HYPOTHYROIDISM 05/09/2008   Impaired glucose tolerance 01/28/2011   INSOMNIA, HX OF 05/23/2007   Joint pain    LATERAL EPICONDYLITIS, RIGHT 03/10/2009   LIPOMA 10/22/2010   NECK PAIN 11/10/2010   OBESITY 05/23/2007   Osteopenia 09/28/2018   Plantar fasciitis    Both feet   Pre-diabetes    SHOULDER PAIN, LEFT 05/17/2010   SINUSITIS- ACUTE-NOS 11/03/2008   SKIN LESION 11/10/2010   Sleep apnea    Stage III chronic kidney disease (HCC)    Stage III   Unspecified Chest Pain 07/25/2008   UNSPECIFIED URTICARIA 06/04/2009   URI 08/28/2009   UTI 04/29/2008   VITAMIN D  DEFICIENCY 08/28/2009   Wheezing 07/12/2010    Past Surgical History:  Procedure Laterality  Date   COLONOSCOPY     COLONOSCOPY WITH PROPOFOL  N/A 04/25/2022   Procedure: COLONOSCOPY WITH PROPOFOL ;  Surgeon: Sergio Dandy, MD;  Location: [facility];  Service: Gastroenterology;  Laterality: N/A;   ENDOMETRIAL ABLATION     LAMINECTOMY  1996   LAPAROTOMY     exploratory   lower back surgery  07/06/2017   cyst   POLYPECTOMY     POLYPECTOMY  04/25/2022   Procedure: POLYPECTOMY;  Surgeon: Sergio Dandy, MD;  Location: [facility];  Service: Gastroenterology;;   s/p endometrial ablation  12/06   TUBAL LIGATION     UTERINE FIBROID SURGERY     WISDOM TOOTH EXTRACTION      Prior to Admission medications   Medication Sig Start Date End Date Taking? Authorizing Provider  acetaminophen  (TYLENOL ) 650 MG CR tablet Take by mouth.   Yes [provider]  albuterol  (VENTOLIN  HFA) 108 (90 Base) MCG/ACT inhaler Inhale 2 puffs into the lungs every 4 (four) hours as needed. 04/18/23  Yes Roslyn Coombe, MD  allopurinol  (ZYLOPRIM ) 100 MG tablet Take 2 tablets (200 mg total) by mouth daily. 05/22/23  Yes Roslyn Coombe, MD  Cholecalciferol  (VITAMIN D -1000 MAX ST) 25 MCG (1000 UT) tablet Take by mouth.   Yes [provider]  diclofenac  Sodium (PENNSAID ) 2 % SOLN Apply 2 Pumps (40 mg total) topically  to  affected knee(s) 2 (two) times daily. 03/12/24  Yes   estradiol (ESTRACE) 0.1 MG/GM vaginal cream Place 1 Applicatorful vaginally once a week. 08/10/23  Yes [provider]  fexofenadine (ALLEGRA) 180 MG tablet Take 180 mg by mouth daily.   Yes [provider]  fluconazole  (DIFLUCAN ) 150 MG tablet Take 1 tablet by mouth every 3 days as needed 11/27/23  Yes Roslyn Coombe, MD  fluticasone -salmeterol (ADVAIR  DISKUS) 250-50 MCG/ACT AEPB Inhale 1 puff into the lungs in the morning and at bedtime. 12/27/23  Yes Roslyn Coombe, MD  magnesium oxide (MAG-OX) 400 MG tablet Take 400 mg by mouth daily.   Yes [provider]  aspirin  81 MG EC tablet Take 81 mg by mouth  daily.    [provider]  dapagliflozin  propanediol (FARXIGA ) 5 MG TABS tablet Take 1 tablet (5 mg total) by mouth daily before breakfast. 09/11/23   Roslyn Coombe, MD  ezetimibe  (ZETIA ) 10 MG tablet Take 1 tablet (10 mg total) by mouth daily. 02/21/24   Roslyn Coombe, MD  gabapentin  (NEURONTIN ) 100 MG capsule Take 1 capsule (100 mg total) by mouth 2 (two) times daily. 09/13/23   Claiborne Crew, MD  iron  polysaccharides (NU-IRON ) 150 MG capsule Take 1 capsule (150 mg total) by mouth daily. 11/16/23   Roslyn Coombe, MD  levothyroxine  (SYNTHROID ) 125 MCG tablet Take 1 tablet (125 mcg total) by mouth daily. 11/16/23   Roslyn Coombe, MD  losartan  (COZAAR ) 25 MG tablet Take 0.5 tablets (12.5 mg total) by mouth daily. NEED APPOINTMENT 02/21/24   Maudine Sos, MD  metroNIDAZOLE  (METROCREAM ) 0.75 % cream Apply topically. 11/18/22   [provider]  pantoprazole  (PROTONIX ) 40 MG tablet Take 1 tablet (40 mg total) by mouth daily. Patient needs follow up appointment for future refills. Please call [phone] to schedule an appointment. 08/21/23 08/20/24  Esterwood, Amy S, PA-C  potassium chloride  SA (KLOR-CON  M) 20 MEQ tablet Take 1 tablet (20 mEq total) by mouth daily. OK to take extra on days you take extra torsemide . Patient taking differently: Take 20 mEq by mouth as needed. 02/15/22   Maudine Sos, MD  tirzepatide  (MOUNJARO ) 7.5 MG/0.5ML Pen Inject 7.5 mg into the skin once a week. 02/21/24   Roslyn Coombe, MD  torsemide  (DEMADEX ) 20 MG tablet Take 3 tablets (60 mg total) by mouth daily. Patient taking differently: Take 60 mg by mouth as needed. 08/01/22   Maudine Sos, MD    Current Facility-Administered Medications  Medication Dose Route Frequency Provider Last Rate Last Admin   0.9 %  sodium chloride  infusion   Intravenous Continuous Garr Kalata, PA-C 20 mL/hr at 03/26/24 9629 New Bag at 03/26/24 0714    Allergies as of 12/25/2023 - Review Complete 12/25/2023   Allergen Reaction Noted   Shellfish allergy Anaphylaxis 08/09/2018   Clarithromycin Nausea And Vomiting 09/09/2015   Latex Itching and Other (See Comments) 01/02/2013   Metronidazole  Nausea And Vomiting 05/23/2007   Zostavax [zoster vaccine live]  09/27/2017   Adhesive [tape] Dermatitis 09/02/2019   Amiloride  01/13/2021   Clindamycin Nausea Only and Nausea And Vomiting 04/19/2022   Gadobenate  01/13/2021   Lisinopril  Cough 08/09/2018    Family History  Problem Relation Age of Onset   Heart disease Mother        Rheumatic heart disease   Depression Mother    COPD Father    Hypertension Father    Diabetes Sister    Kidney disease Sister  Cancer Maternal Grandmother        Breast   Diabetes Cousin    Hypertension Cousin    Heart disease Cousin    Colon cancer Neg Hx    Esophageal cancer Neg Hx    Rectal cancer Neg Hx    Stomach cancer Neg Hx     Social History   Socioeconomic History   Marital status: Single    Spouse name: Not on file   Number of children: 3   Years of education: Not on file   Highest education level: Master's degree (e.g., MA, MS, MEng, MEd, MSW, MBA)  Occupational History   Occupation: SOCIAL WORKER    Employer: [employer]    Comment: [place]   Occupation: Advertising account executive for preschool infants  Tobacco Use   Smoking status: Former    Current packs/day: 0.00    Average packs/day: 0.5 packs/day for 12.0 years (6.0 ttl pk-yrs)    Types: Cigarettes    Start date: 11/05/1975    Quit date: 11/05/1987    Years since quitting: 36.4   Smokeless tobacco: Never  Vaping Use   Vaping status: Never Used  Substance and Sexual Activity   Alcohol use: Yes    Alcohol/week: 1.0 standard drink of alcohol    Types: 1 Glasses of wine per week    Comment: WINE   Drug use: No   Sexual activity: Not on file  Other Topics Concern   Not on file  Social History Narrative   Not on file   Social Drivers of Health   Financial Resource  Strain: Low Risk  (10/14/2023)   Overall Financial Resource Strain (CARDIA)    Difficulty of Paying Living Expenses: Not hard at all  Food Insecurity: No Food Insecurity (10/14/2023)   Hunger Vital Sign    Worried About Running Out of Food in the Last Year: Never true    Ran Out of Food in the Last Year: Never true  Transportation Needs: No Transportation Needs (10/14/2023)   PRAPARE - Administrator, Civil Service (Medical): No    Lack of Transportation (Non-Medical): No  Physical Activity: Sufficiently Active (10/14/2023)   Exercise Vital Sign    Days of Exercise per Week: 3 days    Minutes of Exercise per Session: 60 min  Stress: No Stress Concern Present (10/14/2023)   [employer] of Occupational Health - Occupational Stress Questionnaire    Feeling of Stress : Not at all  Social Connections: Moderately Integrated (10/14/2023)   Social Connection and Isolation Panel    Frequency of Communication with Friends and Family: More than three times a week    Frequency of Social Gatherings with Friends and Family: More than three times a week    Attends Religious Services: More than 4 times per year    Active Member of [employer] or Organizations: Yes    Attends Banker Meetings: More than 4 times per year    Marital Status: Divorced  Intimate Partner Violence: Unknown (01/13/2022)   Received from [facility]   HITS    Physically Hurt: Not on file    Insult or Talk Down To: Not on file    Threaten Physical Harm: Not on file    Scream or Curse: Not on file    Review of Systems:  All other review of systems negative except as mentioned in the HPI.  Physical Exam: Vital signs in last 24 hours: Temp:  [97.5 F (36.4 C)] 97.5  F (36.4 C) (06/17 0658) Resp:  [13] 13 (06/17 0658) BP: (162)/(82) 162/82 (06/17 0658) SpO2:  [100 %] 100 % (06/17 0658) Weight:  [89.4 kg] 89.4 kg (06/17 0658)   General:   Alert, NAD Lungs:  Clear .   Heart:  Regular rate and  rhythm Abdomen:  Soft, nontender and nondistended. Neuro/Psych:  Alert and cooperative. Normal mood and affect. A and O x 3   K. Veena Dameion Briles , MD [phone]

## 2024-03-26 NOTE — Discharge Instructions (Signed)

## 2024-03-26 NOTE — Anesthesia Preprocedure Evaluation (Addendum)
 Anesthesia Evaluation  Patient identified by MRN, date of birth, ID band Patient awake    Reviewed: Allergy & Precautions, NPO status , Patient's Chart, lab work & pertinent test results  Airway Mallampati: III  TM Distance: >3 FB Neck ROM: Full    Dental no notable dental hx.    Pulmonary asthma , sleep apnea , former smoker   Pulmonary exam normal        Cardiovascular hypertension, Pt. on medications + Peripheral Vascular Disease and +CHF  Normal cardiovascular exam     Neuro/Psych  Headaches  Anxiety      Neuromuscular disease    GI/Hepatic Neg liver ROS,GERD  Medicated and Controlled,,  Endo/Other  diabetesHypothyroidism  Patient on GLP-1 Agonist  Renal/GU Renal disease     Musculoskeletal  (+) Arthritis ,    Abdominal  (+) + obese  Peds  Hematology negative hematology ROS (+)   Anesthesia Other Findings Iron  deficiency Melena    Reproductive/Obstetrics                             Anesthesia Physical Anesthesia Plan  ASA: 3  Anesthesia Plan: General   Post-op Pain Management:    Induction:   PONV Risk Score and Plan: 3 and Propofol  infusion and Treatment may vary due to age or medical condition  Airway Management Planned: Nasal Cannula  Additional Equipment:   Intra-op Plan:   Post-operative Plan:   Informed Consent: I have reviewed the patients History and Physical, chart, labs and discussed the procedure including the risks, benefits and alternatives for the proposed anesthesia with the patient or authorized representative who has indicated his/her understanding and acceptance.       Plan Discussed with: CRNA  Anesthesia Plan Comments:        Anesthesia Quick Evaluation

## 2024-03-26 NOTE — Op Note (Signed)
 [facility] Patient Name: Suzanne Hansen Procedure Date: 03/26/2024 MRN: [id number] Attending MD: Sergio Dandy , MD, [id number] Date of Birth: December 03, 1955 CSN: [id number] Age: 68 Admit Type: Outpatient Procedure:                Colonoscopy Indications:              Unexplained iron  deficiency anemia Providers:                Sergio Dandy, MD, Suzann Ernst, RN, Judith Novak, Technician Referring MD:              Medicines:                Monitored Anesthesia Care Complications:            No immediate complications. Estimated Blood Loss:     Estimated blood loss was minimal. Procedure:                Pre-Anesthesia Assessment:                           - Prior to the procedure, a History and Physical                            was performed, and patient medications and                            allergies were reviewed. The patient's tolerance of                            previous anesthesia was also reviewed. The risks                            and benefits of the procedure and the sedation                            options and risks were discussed with the patient.                            All questions were answered, and informed consent                            was obtained. Prior Anticoagulants: The patient has                            taken no anticoagulant or antiplatelet agents. ASA                            Grade Assessment: III - A patient with severe                            systemic disease. After reviewing the risks and  benefits, the patient was deemed in satisfactory                            condition to undergo the procedure.                           After obtaining informed consent, the colonoscope                            was passed under direct vision. Throughout the                            procedure, the patient's blood pressure, pulse, and                             oxygen saturations were monitored continuously. The                            [id number]-HQ190L ([id number]) Olympus colonoscope was                            introduced through the anus and advanced to the the                            cecum, identified by appendiceal orifice and                            ileocecal valve. The colonoscopy was performed                            without difficulty. The patient tolerated the                            procedure well. The quality of the bowel                            preparation was adequate. The ileocecal valve,                            appendiceal orifice, and rectum were photographed. Scope In: 7:50:06 AM Scope Out: 8:05:43 AM Scope Withdrawal Time: 0 hours 11 minutes 43 seconds  Total Procedure Duration: 0 hours 15 minutes 37 seconds  Findings:      The perianal and digital rectal examinations were normal.      A 1 mm polyp was found in the cecum. The polyp was sessile. The polyp       was removed with a cold biopsy forceps. Resection and retrieval were       complete.      Scattered large-mouthed, medium-mouthed and small-mouthed diverticula       were found in the sigmoid colon, descending colon, transverse colon and       ascending colon. There was evidence of diverticular spasm.      Non-bleeding external and internal hemorrhoids were found during       retroflexion. The hemorrhoids were medium-sized. Impression:               -  One 1 mm polyp in the cecum, removed with a cold                            biopsy forceps. Resected and retrieved.                           - Moderate diverticulosis in the sigmoid colon, in                            the descending colon, in the transverse colon and                            in the ascending colon. There was evidence of                            diverticular spasm.                           - Non-bleeding external and internal hemorrhoids. Moderate Sedation:      N/A Recommendation:            - Patient has a contact number available for                            emergencies. The signs and symptoms of potential                            delayed complications were discussed with the                            patient. Return to normal activities tomorrow.                            Written discharge instructions were provided to the                            patient.                           - Resume previous diet.                           - Continue present medications.                           - Await pathology results.                           - Repeat colonoscopy in 5 years for surveillance. Procedure Code(s):        --- Professional ---                           [phone], Colonoscopy, flexible; with biopsy, single                            or multiple Diagnosis Code(s):        ---  Professional ---                           D12.0, Benign neoplasm of cecum                           K64.8, Other hemorrhoids                           D50.9, Iron  deficiency anemia, unspecified                           K57.30, Diverticulosis of large intestine without                            perforation or abscess without bleeding CPT copyright 2022 American Medical Association. All rights reserved. The codes documented in this report are preliminary and upon coder review may  be revised to meet current compliance requirements. Shantice Menger V. Olive Motyka, MD 03/26/2024 8:19:16 AM This report has been signed electronically. Number of Addenda: 0

## 2024-03-26 NOTE — Transfer of Care (Signed)
 Immediate Anesthesia Transfer of Care Note  Patient: Suzanne Hansen  Procedure(s) Performed: COLONOSCOPY EGD (ESOPHAGOGASTRODUODENOSCOPY) BIOPSY, SKIN, SUBCUTANEOUS TISSUE, OR MUCOUS MEMBRANE POLYPECTOMY, INTESTINE  Patient Location: Endoscopy Unit  Anesthesia Type:MAC  Level of Consciousness: drowsy and patient cooperative  Airway & Oxygen Therapy: Patient Spontanous Breathing  Post-op Assessment: Report given to RN and Post -op Vital signs reviewed and stable  Post vital signs: Reviewed and stable  Last Vitals:  Vitals Value Taken Time  BP    Temp    Pulse 99 03/26/24 08:13  Resp 24 03/26/24 08:13  SpO2 97 % 03/26/24 08:13  Vitals shown include unfiled device data.  Last Pain:  Vitals:   03/26/24 0658  TempSrc: Temporal  PainSc: 0-No pain      Patients Stated Pain Goal: 0 (03/26/24 7846)  Complications: No notable events documented.

## 2024-03-26 NOTE — Op Note (Addendum)
 [facility] Patient Name: Suzanne Hansen Procedure Date: 03/26/2024 MRN: [id number] Attending MD: Sergio Dandy , MD, [id number] Date of Birth: 1956/05/18 CSN: [id number] Age: 68 Admit Type: Outpatient Procedure:                Upper GI endoscopy Indications:              Suspected upper gastrointestinal bleeding in                            patient with unexplained iron  deficiency anemia Providers:                Sergio Dandy, MD, Nikki Barters RN, RN, Judith Novak, Technician Referring MD:              Medicines:                Monitored Anesthesia Care Complications:            No immediate complications. Estimated Blood Loss:     Estimated blood loss was minimal. Procedure:                Pre-Anesthesia Assessment:                           - Prior to the procedure, a History and Physical                            was performed, and patient medications and                            allergies were reviewed. The patient's tolerance of                            previous anesthesia was also reviewed. The risks                            and benefits of the procedure and the sedation                            options and risks were discussed with the patient.                            All questions were answered, and informed consent                            was obtained. Prior Anticoagulants: The patient has                            taken no anticoagulant or antiplatelet agents. ASA                            Grade Assessment: III - A patient with severe  systemic disease. After reviewing the risks and                            benefits, the patient was deemed in satisfactory                            condition to undergo the procedure.                           After obtaining informed consent, the endoscope was                            passed under direct vision. Throughout the                             procedure, the patient's blood pressure, pulse, and                            oxygen saturations were monitored continuously. The                            [id number]-[id number] ([id number]) Olympus endoscope was introduced                            through the mouth, and advanced to the second part                            of duodenum. The upper GI endoscopy was                            accomplished without difficulty. The patient                            tolerated the procedure well. Scope In: Scope Out: Findings:      LA Grade C (one or more mucosal breaks continuous between tops of 2 or       more mucosal folds, less than 75% circumference) esophagitis with no       bleeding was found 36 to 38 cm from the incisors.      A few dispersed 5 mm erosions with no bleeding and no stigmata of recent       bleeding were found in the gastric body and in the gastric antrum.       Biopsies were taken with a cold forceps for Helicobacter pylori testing.      Patchy mild inflammation characterized by congestion (edema), erythema       and friability was found in the entire examined stomach. Biopsies were       taken with a cold forceps for Helicobacter pylori testing.      The cardia and gastric fundus were normal on retroflexion.      The examined duodenum was normal. Impression:               - LA Grade C reflux esophagitis with no bleeding.                           -  Erosive gastropathy with no bleeding and no                            stigmata of recent bleeding. Biopsied.                           - Gastritis. Biopsied.                           - Normal examined duodenum. Moderate Sedation:      N/A Recommendation:           - Patient has a contact number available for                            emergencies. The signs and symptoms of potential                            delayed complications were discussed with the                            patient. Return to normal activities  tomorrow.                            Written discharge instructions were provided to the                            patient.                           - Resume previous diet.                           - Continue present medications.                           - Await pathology results.                           - Pantoprazole  40mg  BID                           - Continue Iron                            - Follow up in GI office in 6 months, recheck iron                             panel and CBC in 6 months Procedure Code(s):        --- Professional ---                           [phone], Esophagogastroduodenoscopy, flexible,                            transoral; with biopsy, single or multiple Diagnosis Code(s):        --- Professional ---  K21.00, Gastro-esophageal reflux disease with                            esophagitis, without bleeding                           K31.89, Other diseases of stomach and duodenum                           K29.70, Gastritis, unspecified, without bleeding                           D50.9, Iron  deficiency anemia, unspecified CPT copyright 2022 American Medical Association. All rights reserved. The codes documented in this report are preliminary and upon coder review may  be revised to meet current compliance requirements. Suzanne Jeng V. Macsen Nuttall, MD 03/26/2024 8:30:21 AM This report has been signed electronically. Number of Addenda: 0

## 2024-03-27 NOTE — Anesthesia Postprocedure Evaluation (Signed)
 Anesthesia Post Note  Patient: Suzanne Hansen  Procedure(s) Performed: COLONOSCOPY EGD (ESOPHAGOGASTRODUODENOSCOPY) BIOPSY, SKIN, SUBCUTANEOUS TISSUE, OR MUCOUS MEMBRANE POLYPECTOMY, INTESTINE     Patient location during evaluation: Endoscopy Anesthesia Type: MAC Level of consciousness: awake Pain management: pain level controlled Vital Signs Assessment: post-procedure vital signs reviewed and stable Respiratory status: spontaneous breathing, nonlabored ventilation and respiratory function stable Cardiovascular status: blood pressure returned to baseline and stable Postop Assessment: no apparent nausea or vomiting Anesthetic complications: no   No notable events documented.  Last Vitals:  Vitals:   03/26/24 0820 03/26/24 0831  BP: 130/84 (!) 159/90  Pulse: 91 84  Resp: 13 18  Temp:    SpO2: 99% 99%    Last Pain:  Vitals:   03/26/24 0831  TempSrc:   PainSc: 0-No pain                 Haylo Fake P Mahli Glahn

## 2024-03-28 ENCOUNTER — Other Ambulatory Visit (HOSPITAL_COMMUNITY): Payer: Self-pay

## 2024-03-28 ENCOUNTER — Ambulatory Visit (INDEPENDENT_AMBULATORY_CARE_PROVIDER_SITE_OTHER): Payer: 59 | Admitting: Internal Medicine

## 2024-03-28 ENCOUNTER — Encounter: Payer: Self-pay | Admitting: Internal Medicine

## 2024-03-28 VITALS — BP 120/74 | HR 73 | Temp 98.3°F | Ht 63.5 in | Wt 200.0 lb

## 2024-03-28 DIAGNOSIS — N1831 Chronic kidney disease, stage 3a: Secondary | ICD-10-CM

## 2024-03-28 DIAGNOSIS — M25561 Pain in right knee: Secondary | ICD-10-CM

## 2024-03-28 DIAGNOSIS — E7800 Pure hypercholesterolemia, unspecified: Secondary | ICD-10-CM | POA: Diagnosis not present

## 2024-03-28 DIAGNOSIS — M25562 Pain in left knee: Secondary | ICD-10-CM | POA: Diagnosis not present

## 2024-03-28 DIAGNOSIS — E038 Other specified hypothyroidism: Secondary | ICD-10-CM

## 2024-03-28 DIAGNOSIS — G8929 Other chronic pain: Secondary | ICD-10-CM

## 2024-03-28 DIAGNOSIS — I10 Essential (primary) hypertension: Secondary | ICD-10-CM | POA: Diagnosis not present

## 2024-03-28 DIAGNOSIS — E119 Type 2 diabetes mellitus without complications: Secondary | ICD-10-CM

## 2024-03-28 DIAGNOSIS — E538 Deficiency of other specified B group vitamins: Secondary | ICD-10-CM

## 2024-03-28 DIAGNOSIS — Z7985 Long-term (current) use of injectable non-insulin antidiabetic drugs: Secondary | ICD-10-CM

## 2024-03-28 DIAGNOSIS — E559 Vitamin D deficiency, unspecified: Secondary | ICD-10-CM

## 2024-03-28 DIAGNOSIS — Z7984 Long term (current) use of oral hypoglycemic drugs: Secondary | ICD-10-CM

## 2024-03-28 DIAGNOSIS — E611 Iron deficiency: Secondary | ICD-10-CM

## 2024-03-28 DIAGNOSIS — E1165 Type 2 diabetes mellitus with hyperglycemia: Secondary | ICD-10-CM | POA: Insufficient documentation

## 2024-03-28 LAB — CBC WITH DIFFERENTIAL/PLATELET
Basophils Absolute: 0 10*3/uL (ref 0.0–0.1)
Basophils Relative: 0.5 % (ref 0.0–3.0)
Eosinophils Absolute: 0.1 10*3/uL (ref 0.0–0.7)
Eosinophils Relative: 0.7 % (ref 0.0–5.0)
HCT: 43.5 % (ref 36.0–46.0)
Hemoglobin: 14.2 g/dL (ref 12.0–15.0)
Lymphocytes Relative: 29.8 % (ref 12.0–46.0)
Lymphs Abs: 2.7 10*3/uL (ref 0.7–4.0)
MCHC: 32.5 g/dL (ref 30.0–36.0)
MCV: 88.9 fl (ref 78.0–100.0)
Monocytes Absolute: 0.6 10*3/uL (ref 0.1–1.0)
Monocytes Relative: 6.8 % (ref 3.0–12.0)
Neutro Abs: 5.7 10*3/uL (ref 1.4–7.7)
Neutrophils Relative %: 62.2 % (ref 43.0–77.0)
Platelets: 264 10*3/uL (ref 150.0–400.0)
RBC: 4.9 Mil/uL (ref 3.87–5.11)
RDW: 15.1 % (ref 11.5–15.5)
WBC: 9.1 10*3/uL (ref 4.0–10.5)

## 2024-03-28 LAB — IBC PANEL
Iron: 117 ug/dL (ref 42–145)
Saturation Ratios: 28.3 % (ref 20.0–50.0)
TIBC: 413 ug/dL (ref 250.0–450.0)
Transferrin: 295 mg/dL (ref 212.0–360.0)

## 2024-03-28 LAB — HEPATIC FUNCTION PANEL
ALT: 15 U/L (ref 0–35)
AST: 19 U/L (ref 0–37)
Albumin: 4.3 g/dL (ref 3.5–5.2)
Alkaline Phosphatase: 77 U/L (ref 39–117)
Bilirubin, Direct: 0.2 mg/dL (ref 0.0–0.3)
Total Bilirubin: 0.7 mg/dL (ref 0.2–1.2)
Total Protein: 6.9 g/dL (ref 6.0–8.3)

## 2024-03-28 LAB — BASIC METABOLIC PANEL WITH GFR
BUN: 20 mg/dL (ref 6–23)
CO2: 30 meq/L (ref 19–32)
Calcium: 9.8 mg/dL (ref 8.4–10.5)
Chloride: 106 meq/L (ref 96–112)
Creatinine, Ser: 1.43 mg/dL — ABNORMAL HIGH (ref 0.40–1.20)
GFR: 38.09 mL/min — ABNORMAL LOW (ref >60.00)
Glucose, Bld: 89 mg/dL (ref 70–99)
Potassium: 3.9 meq/L (ref 3.5–5.1)
Sodium: 143 meq/L (ref 135–145)

## 2024-03-28 LAB — LIPID PANEL
Cholesterol: 152 mg/dL (ref 0–200)
HDL: 77 mg/dL (ref >39.00)
LDL Cholesterol: 57 mg/dL (ref 0–99)
NonHDL: 74.85
Total CHOL/HDL Ratio: 2
Triglycerides: 87 mg/dL (ref 0.0–149.0)
VLDL: 17.4 mg/dL (ref 0.0–40.0)

## 2024-03-28 LAB — SURGICAL PATHOLOGY

## 2024-03-28 LAB — FERRITIN: Ferritin: 26.2 ng/mL (ref 10.0–291.0)

## 2024-03-28 LAB — HEMOGLOBIN A1C: Hgb A1c MFr Bld: 5.2 % (ref 4.6–6.5)

## 2024-03-28 MED ORDER — TRAMADOL HCL 50 MG PO TABS
50.0000 mg | ORAL_TABLET | Freq: Four times a day (QID) | ORAL | 0 refills | Status: DC | PRN
  Filled 2024-03-28: qty 30, 8d supply, fill #0

## 2024-03-28 NOTE — Progress Notes (Unsigned)
 Patient ID: Suzanne Hansen, female   DOB: [birth year], 68 y.o.   MRN: [id number]        Chief Complaint: follow up HTN, HLD and hyperglycemia ***       HPI:  Suzanne Hansen is a 68 y.o. female here with c/o        Wt Readings from Last 3 Encounters:  03/28/24 200 lb (90.7 kg)  03/26/24 197 lb (89.4 kg)  12/25/23 200 lb 4 oz (90.8 kg)   BP Readings from Last 3 Encounters:  03/28/24 120/74  03/26/24 (!) 159/90  12/25/23 120/80         Past Medical History:  Diagnosis Date   ALLERGIC RHINITIS 05/09/2008   Allergy    ANXIETY 05/23/2007   ARM PAIN, LEFT 10/23/2009   Arthritis    ASTHMA 05/23/2007   Back pain    Cardiomyopathy (HCC) 09/08/2015   Cataract    Chest pain    CHF (congestive heart failure) (HCC)    Chronic diastolic heart failure (HCC) 01/22/2020   Constipation    CONTACT DERMATITIS 06/09/2009   Diabetes mellitus without complication (HCC)    type II   Dyspnea    allergies-dust, cig smoke, rag weeds   ENDOMETRIOSIS NOS 05/23/2007   FACIAL PAIN 06/02/2010   Food allergy    FREQUENCY, URINARY 05/17/2010   GERD (gastroesophageal reflux disease)    GLUCOSE INTOLERANCE 08/28/2009   HERPES SIMPLEX, UNCOMPLICATED 05/23/2007   Hyperlipidemia 08/28/2015   HYPERTHYROIDISM 05/23/2007   Hypoactive thyroid     HYPOTHYROIDISM 05/09/2008   Impaired glucose tolerance 01/28/2011   INSOMNIA, HX OF 05/23/2007   Joint pain    LATERAL EPICONDYLITIS, RIGHT 03/10/2009   LIPOMA 10/22/2010   NECK PAIN 11/10/2010   OBESITY 05/23/2007   Osteopenia 09/28/2018   Plantar fasciitis    Both feet   Pre-diabetes    SHOULDER PAIN, LEFT 05/17/2010   SINUSITIS- ACUTE-NOS 11/03/2008   SKIN LESION 11/10/2010   Sleep apnea    Stage III chronic kidney disease (HCC)    Stage III   Unspecified Chest Pain 07/25/2008   UNSPECIFIED URTICARIA 06/04/2009   URI 08/28/2009   UTI 04/29/2008   VITAMIN D  DEFICIENCY 08/28/2009   Wheezing 07/12/2010   Past Surgical History:  Procedure  Laterality Date   COLONOSCOPY     COLONOSCOPY WITH PROPOFOL  N/A 04/25/2022   Procedure: COLONOSCOPY WITH PROPOFOL ;  Surgeon: Sergio Dandy, MD;  Location: [facility];  Service: Gastroenterology;  Laterality: N/A;   ENDOMETRIAL ABLATION     LAMINECTOMY  1996   LAPAROTOMY     exploratory   lower back surgery  07/06/2017   cyst   POLYPECTOMY     POLYPECTOMY  04/25/2022   Procedure: POLYPECTOMY;  Surgeon: Sergio Dandy, MD;  Location: [facility];  Service: Gastroenterology;;   s/p endometrial ablation  12/06   TUBAL LIGATION     UTERINE FIBROID SURGERY     WISDOM TOOTH EXTRACTION      reports that she quit smoking about 36 years ago. Her smoking use included cigarettes. She started smoking about 48 years ago. She has a 6 pack-year smoking history. She has never used smokeless tobacco. She reports current alcohol use of about 1.0 standard drink of alcohol per week. She reports that she does not use drugs. family history includes COPD in her father; Cancer in her maternal grandmother; Depression in her mother; Diabetes in her cousin and sister; Heart disease in her cousin and mother; Hypertension in her cousin and father;  Kidney disease in her sister. Allergies  Allergen Reactions   Shellfish Allergy Anaphylaxis   Clarithromycin Nausea And Vomiting   Latex Itching and Other (See Comments)    ITCHING AND COLD SORES AROUND MOUTH WHEN LATEX GLOVES USED BY THE DENTIST/HYGENTIST   Metronidazole  Nausea And Vomiting   Zostavax [Zoster Vaccine Live]     Arm swelled   Adhesive [Tape] Dermatitis    Plastic tape    Amiloride     foot numbness   Clindamycin Nausea Only and Nausea And Vomiting   Gadobenate     Pt has had some form of dye without issues but isnt sure if she is allergic to this specific agent    Lisinopril  Cough   Naproxen Sodium Other (See Comments)   Current Outpatient Medications on File Prior to Visit  Medication Sig Dispense Refill   acetaminophen  (TYLENOL ) 650  MG CR tablet Take by mouth.     albuterol  (VENTOLIN  HFA) 108 (90 Base) MCG/ACT inhaler Inhale 2 puffs into the lungs every 4 (four) hours as needed. 6.7 g 11   allopurinol  (ZYLOPRIM ) 100 MG tablet Take 2 tablets (200 mg total) by mouth daily. 180 tablet 3   aspirin  81 MG EC tablet Take 81 mg by mouth daily.     Cholecalciferol  (VITAMIN D -1000 MAX ST) 25 MCG (1000 UT) tablet Take by mouth.     dapagliflozin  propanediol (FARXIGA ) 5 MG TABS tablet Take 1 tablet (5 mg total) by mouth daily before breakfast. 90 tablet 3   diclofenac  Sodium (PENNSAID ) 2 % SOLN Apply 2 Pumps (40 mg total) topically  to affected knee(s) 2 (two) times daily. 112 g 0   estradiol (ESTRACE) 0.1 MG/GM vaginal cream Place 1 Applicatorful vaginally once a week.     ezetimibe  (ZETIA ) 10 MG tablet Take 1 tablet (10 mg total) by mouth daily. 90 tablet 3   fexofenadine (ALLEGRA) 180 MG tablet Take 180 mg by mouth daily.     fluconazole  (DIFLUCAN ) 150 MG tablet Take 1 tablet by mouth every 3 days as needed 2 tablet 1   fluticasone -salmeterol (ADVAIR  DISKUS) 250-50 MCG/ACT AEPB Inhale 1 puff into the lungs in the morning and at bedtime. 180 each 3   gabapentin  (NEURONTIN ) 100 MG capsule Take 1 capsule (100 mg total) by mouth 2 (two) times daily. 60 capsule 1   iron  polysaccharides (NU-IRON ) 150 MG capsule Take 1 capsule (150 mg total) by mouth daily. 90 capsule 1   levothyroxine  (SYNTHROID ) 125 MCG tablet Take 1 tablet (125 mcg total) by mouth daily. 90 tablet 3   losartan  (COZAAR ) 25 MG tablet Take 0.5 tablets (12.5 mg total) by mouth daily. NEED APPOINTMENT 45 tablet 3   magnesium oxide (MAG-OX) 400 MG tablet Take 400 mg by mouth daily.     metroNIDAZOLE  (METROCREAM ) 0.75 % cream Apply topically.     MIEBO 1.338 GM/ML SOLN      pantoprazole  (PROTONIX ) 40 MG tablet Take 1 tablet (40 mg total) by mouth 2 (two) times daily before a meal. 180 tablet 3   potassium chloride  SA (KLOR-CON  M) 20 MEQ tablet Take 1 tablet (20 mEq total) by  mouth daily. OK to take extra on days you take extra torsemide . (Patient taking differently: Take 20 mEq by mouth as needed.) 180 tablet 2   tirzepatide  (MOUNJARO ) 7.5 MG/0.5ML Pen Inject 7.5 mg into the skin once a week. 6 mL 3   torsemide  (DEMADEX ) 20 MG tablet Take 3 tablets (60 mg total) by mouth daily. (Patient  taking differently: Take 60 mg by mouth as needed.) 270 tablet 1   No current facility-administered medications on file prior to visit.        ROS:  All others reviewed and negative.  Objective        PE:  BP 120/74 (BP Location: Right Arm, Patient Position: Sitting, Cuff Size: Normal)   Pulse 73   Temp 98.3 F (36.8 C) (Oral)   Ht 5' 3.5 (1.613 m)   Wt 200 lb (90.7 kg)   SpO2 99%   BMI 34.87 kg/m                 Constitutional: Pt appears in NAD               HENT: Head: NCAT.                Right Ear: External ear normal.                 Left Ear: External ear normal.                Eyes: . Pupils are equal, round, and reactive to light. Conjunctivae and EOM are normal               Nose: without d/c or deformity               Neck: Neck supple. Gross normal ROM               Cardiovascular: Normal rate and regular rhythm.                 Pulmonary/Chest: Effort normal and breath sounds without rales or wheezing.                Abd:  Soft, NT, ND, + BS, no organomegaly               Neurological: Pt is alert. At baseline orientation, motor grossly intact               Skin: Skin is warm. No rashes, no other new lesions, LE edema - ***               Psychiatric: Pt behavior is normal without agitation   Micro: none  Cardiac tracings I have personally interpreted today:  none  Pertinent Radiological findings (summarize): none   Lab Results  Component Value Date   WBC 7.5 12/25/2023   HGB 14.1 12/25/2023   HCT 42.3 12/25/2023   PLT 308.0 12/25/2023   GLUCOSE 82 12/25/2023   CHOL 154 11/15/2023   TRIG 89.0 11/15/2023   HDL 80.90 11/15/2023   LDLCALC 55  11/15/2023   ALT 23 12/25/2023   AST 20 12/25/2023   NA 143 12/25/2023   K 3.8 12/25/2023   CL 104 12/25/2023   CREATININE 1.28 (H) 12/25/2023   BUN 13 12/25/2023   CO2 29 12/25/2023   TSH 0.01 (L) 11/15/2023   INR 1.0 03/21/2017   HGBA1C 5.4 11/15/2023   MICROALBUR <0.7 11/15/2023   Assessment/Plan:  Suzanne Hansen is a 68 y.o. Black or African American [2] female with  has a past medical history of ALLERGIC RHINITIS (05/09/2008), Allergy, ANXIETY (05/23/2007), ARM PAIN, LEFT (10/23/2009), Arthritis, ASTHMA (05/23/2007), Back pain, Cardiomyopathy (HCC) (09/08/2015), Cataract, Chest pain, CHF (congestive heart failure) (HCC), Chronic diastolic heart failure (HCC) (60/45/4098), Constipation, CONTACT DERMATITIS (06/09/2009), Diabetes mellitus without complication (HCC), Dyspnea, ENDOMETRIOSIS NOS (05/23/2007), FACIAL PAIN (06/02/2010), Food allergy, FREQUENCY, URINARY (05/17/2010), GERD (  gastroesophageal reflux disease), GLUCOSE INTOLERANCE (08/28/2009), HERPES SIMPLEX, UNCOMPLICATED (05/23/2007), Hyperlipidemia (08/28/2015), HYPERTHYROIDISM (05/23/2007), Hypoactive thyroid , HYPOTHYROIDISM (05/09/2008), Impaired glucose tolerance (01/28/2011), INSOMNIA, HX OF (05/23/2007), Joint pain, LATERAL EPICONDYLITIS, RIGHT (03/10/2009), LIPOMA (10/22/2010), NECK PAIN (11/10/2010), OBESITY (05/23/2007), Osteopenia (09/28/2018), Plantar fasciitis, Pre-diabetes, SHOULDER PAIN, LEFT (05/17/2010), SINUSITIS- ACUTE-NOS (11/03/2008), SKIN LESION (11/10/2010), Sleep apnea, Stage III chronic kidney disease (HCC), Unspecified Chest Pain (07/25/2008), UNSPECIFIED URTICARIA (06/04/2009), URI (08/28/2009), UTI (04/29/2008), VITAMIN D  DEFICIENCY (08/28/2009), and Wheezing (07/12/2010).  No problem-specific Assessment & Plan notes found for this encounter.  Followup: No follow-ups on file.  Rosalia Colonel, MD 03/28/2024 1:42 PM [facility]

## 2024-03-28 NOTE — Patient Instructions (Addendum)
 Please take all new medication as prescribed - the tramadol   Please continue all other medications as before, and refills have been done if requested.  Please have the pharmacy call with any other refills you may need.  Please continue your efforts at being more active, low cholesterol diet, and weight control.  You are otherwise up to date with prevention measures today.  Please keep your appointments with your specialists as you may have planned  Please go to the LAB at the blood drawing area for the tests to be done  You will be contacted by phone if any changes need to be made immediately.  Otherwise, you will receive a letter about your results with an explanation, but please check with MyChart first.  Please make an Appointment to return in Jan 2026, or sooner if needed

## 2024-03-29 ENCOUNTER — Telehealth: Payer: Self-pay | Admitting: Cardiovascular Disease

## 2024-03-29 ENCOUNTER — Encounter (HOSPITAL_COMMUNITY): Payer: Self-pay | Admitting: Gastroenterology

## 2024-03-29 ENCOUNTER — Encounter (HOSPITAL_BASED_OUTPATIENT_CLINIC_OR_DEPARTMENT_OTHER): Payer: Self-pay | Admitting: Cardiovascular Disease

## 2024-03-29 ENCOUNTER — Ambulatory Visit: Payer: Self-pay | Admitting: Gastroenterology

## 2024-03-29 ENCOUNTER — Ambulatory Visit: Payer: Self-pay | Admitting: Internal Medicine

## 2024-03-29 NOTE — Telephone Encounter (Signed)
 Pt c/o medication issue:  1. Name of Medication: torsemide  (DEMADEX ) 20 MG tablet   2. How are you currently taking this medication (dosage and times per day)?   Take 3 tablets (60 mg total) by mouth daily.Patient taking differently: Take 60 mg by mouth as needed.    3. Are you having a reaction (difficulty breathing--STAT)? no  4. What is your medication issue? Patient states that this is too much medication for her to take. She states that she dont swell like that. Please advise

## 2024-03-29 NOTE — Telephone Encounter (Signed)
 Left message with call back number  Would like further information

## 2024-03-31 ENCOUNTER — Encounter: Payer: Self-pay | Admitting: Internal Medicine

## 2024-03-31 DIAGNOSIS — M25561 Pain in right knee: Secondary | ICD-10-CM | POA: Insufficient documentation

## 2024-03-31 NOTE — Assessment & Plan Note (Signed)
 Lab Results  Component Value Date   LDLCALC 57 03/28/2024   Stable, pt to continue current zetia  10 qd

## 2024-03-31 NOTE — Assessment & Plan Note (Signed)
 Last vitamin D  Lab Results  Component Value Date   VD25OH 29.46 (L) 11/15/2023   Low, to start oral replacement

## 2024-03-31 NOTE — Assessment & Plan Note (Signed)
 Lab Results  Component Value Date   CREATININE 1.43 (H) 03/28/2024   Stable overall, cont to avoid nephrotoxins

## 2024-03-31 NOTE — Assessment & Plan Note (Signed)
No overt bleeding, for f/u lab 

## 2024-03-31 NOTE — Assessment & Plan Note (Signed)
 Lab Results  Component Value Date   VITAMINB12 330 12/25/2023   Stable, cont oral replacement - b12 1000 mcg qd

## 2024-03-31 NOTE — Assessment & Plan Note (Signed)
 Lab Results  Component Value Date   HGBA1C 5.2 03/28/2024   Stable, pt to continue current medical treatment farxiga  5 every day, mounjaro  7.5 weekly

## 2024-03-31 NOTE — Assessment & Plan Note (Signed)
 Worsening arthritic, pt to see sports medicine soon, for tramadol  50 mg prn

## 2024-03-31 NOTE — Assessment & Plan Note (Signed)
 BP Readings from Last 3 Encounters:  03/28/24 120/74  03/26/24 (!) 159/90  12/25/23 120/80   Stable, pt to continue medical treatment losartan  25 qd

## 2024-04-01 NOTE — Telephone Encounter (Signed)
 Please review and advise- I cannot find where she was advised to take K+

## 2024-04-02 ENCOUNTER — Ambulatory Visit: Admitting: Pulmonary Disease

## 2024-04-02 ENCOUNTER — Encounter: Payer: Self-pay | Admitting: Pulmonary Disease

## 2024-04-02 ENCOUNTER — Other Ambulatory Visit (HOSPITAL_COMMUNITY): Payer: Self-pay

## 2024-04-02 ENCOUNTER — Other Ambulatory Visit: Payer: Self-pay

## 2024-04-02 VITALS — BP 135/86 | HR 75 | Ht 63.5 in | Wt 203.0 lb

## 2024-04-02 DIAGNOSIS — J4540 Moderate persistent asthma, uncomplicated: Secondary | ICD-10-CM

## 2024-04-02 DIAGNOSIS — Z87891 Personal history of nicotine dependence: Secondary | ICD-10-CM | POA: Diagnosis not present

## 2024-04-02 MED ORDER — AIRSUPRA 90-80 MCG/ACT IN AERO
2.0000 | INHALATION_SPRAY | RESPIRATORY_TRACT | 3 refills | Status: DC | PRN
  Filled 2024-04-02: qty 10.7, 30d supply, fill #0
  Filled 2024-05-29: qty 10.7, 30d supply, fill #1
  Filled 2024-06-29: qty 21.4, 60d supply, fill #2

## 2024-04-02 MED ORDER — FLUTICASONE-SALMETEROL 500-50 MCG/ACT IN AEPB
1.0000 | INHALATION_SPRAY | Freq: Two times a day (BID) | RESPIRATORY_TRACT | 3 refills | Status: AC
  Filled 2024-04-02: qty 180, 90d supply, fill #0
  Filled 2024-06-29: qty 180, 90d supply, fill #1
  Filled 2024-11-02: qty 180, 90d supply, fill #2

## 2024-04-02 NOTE — Telephone Encounter (Signed)
 The patient has been communicating via MyChart about this medication.

## 2024-04-02 NOTE — Progress Notes (Signed)
 Suzanne Hansen    [id number]    February 01, 1956  Primary Care Physician:John, Lynwood ORN, MD  Referring Physician: Norleen Lynwood ORN, MD [address] [place],  [place] [address]  Chief complaint: Follow-up for asthma  HPI: 68 y.o. with history of asthma, OSA, heart failure, atrial fibrillation She had an episode of pneumonia in June 2022 and another episode in December 2022 requiring multiple rounds of prednisone  and antibiotics.  Chest x-ray in December showed a left lower lobe opacity concerning for atelectasis and has been referred here for further evaluation  Currently she states that her symptoms are better.  She has occasional cough with wheezing Continues on Symbicort  for asthma She is due to get CT coronary next week for evaluation of cardiomyopathy with EF 45%  She has history of sleep apnea and follows with Dr. Chalice at [facility] neurology  Interim history:  Discussed the use of AI scribe software for clinical note transcription with the patient, who gave verbal consent to proceed.  History of Present Illness Suzanne Hansen is a 68 year old female with asthma who presents with concerns about asthma management.  She is no longer on Symbicort  due to insurance issues and is currently using Advair  at a dose of 250/50. Advair  does not maintain her asthma control as effectively, leading to increased use of her albuterol  inhaler.  She has a recent diagnosis of low iron  levels and gastrointestinal bleeding. She underwent an endoscopy and colonoscopy last week which showed esophagitis, erosive gastritis apathy and moderate diverticulosis. She feels 'a little tired' as a result of these issues.   Pets: No pets Occupation: Advertising account executive for early childhood Exposures: No mold, hot tub, Jacuzzi.  No feather pillows or comforters Smoking history: 6-pack-year smoker.  Quit in 1988 Travel history: No significant travel history Relevant family history: No family history of lung  disease  Outpatient Encounter Medications as of 04/02/2024  Medication Sig   acetaminophen  (TYLENOL ) 650 MG CR tablet Take by mouth. (Patient not taking: Reported on 04/02/2024)   albuterol  (VENTOLIN  HFA) 108 (90 Base) MCG/ACT inhaler Inhale 2 puffs into the lungs every 4 (four) hours as needed.   allopurinol  (ZYLOPRIM ) 100 MG tablet Take 2 tablets (200 mg total) by mouth daily.   aspirin  81 MG EC tablet Take 81 mg by mouth daily.   Cholecalciferol  (VITAMIN D -1000 MAX ST) 25 MCG (1000 UT) tablet Take by mouth.   dapagliflozin  propanediol (FARXIGA ) 5 MG TABS tablet Take 1 tablet (5 mg total) by mouth daily before breakfast.   diclofenac  Sodium (PENNSAID ) 2 % SOLN Apply 2 Pumps (40 mg total) topically  to affected knee(s) 2 (two) times daily.   estradiol (ESTRACE) 0.1 MG/GM vaginal cream Place 1 Applicatorful vaginally once a week.   ezetimibe  (ZETIA ) 10 MG tablet Take 1 tablet (10 mg total) by mouth daily.   fexofenadine (ALLEGRA) 180 MG tablet Take 180 mg by mouth daily.   fluconazole  (DIFLUCAN ) 150 MG tablet Take 1 tablet by mouth every 3 days as needed   fluticasone -salmeterol (ADVAIR  DISKUS) 250-50 MCG/ACT AEPB Inhale 1 puff into the lungs in the morning and at bedtime.   gabapentin  (NEURONTIN ) 100 MG capsule Take 1 capsule (100 mg total) by mouth 2 (two) times daily.   iron  polysaccharides (NU-IRON ) 150 MG capsule Take 1 capsule (150 mg total) by mouth daily.   levothyroxine  (SYNTHROID ) 125 MCG tablet Take 1 tablet (125 mcg total) by mouth daily.   losartan  (COZAAR ) 25  MG tablet Take 0.5 tablets (12.5 mg total) by mouth daily. NEED APPOINTMENT   magnesium oxide (MAG-OX) 400 MG tablet Take 400 mg by mouth daily.   metroNIDAZOLE  (METROCREAM ) 0.75 % cream Apply topically.   MIEBO 1.338 GM/ML SOLN    pantoprazole  (PROTONIX ) 40 MG tablet Take 1 tablet (40 mg total) by mouth 2 (two) times daily before a meal.   potassium chloride  SA (KLOR-CON  M) 20 MEQ tablet Take 1 tablet (20 mEq total) by mouth  daily. OK to take extra on days you take extra torsemide . (Patient taking differently: Take 20 mEq by mouth as needed.)   tirzepatide  (MOUNJARO ) 7.5 MG/0.5ML Pen Inject 7.5 mg into the skin once a week.   torsemide  (DEMADEX ) 20 MG tablet Take 3 tablets (60 mg total) by mouth daily. (Patient taking differently: Take 60 mg by mouth as needed.)   traMADol  (ULTRAM ) 50 MG tablet Take 1 tablet (50 mg total) by mouth every 6 (six) hours as needed.   No facility-administered encounter medications on file as of 04/02/2024.    Physical Exam: Blood pressure 135/86, pulse 75, height 5' 3.5 (1.613 m), weight 203 lb (92.1 kg), SpO2 100%. Gen:      No acute distress HEENT:  EOMI, sclera anicteric Neck:     No masses; no thyromegaly Lungs:    Clear to auscultation bilaterally; normal respiratory effort CV:         Regular rate and rhythm; no murmurs Abd:      + bowel sounds; soft, non-tender; no palpable masses, no distension Ext:    No edema; adequate peripheral perfusion Neuro: alert and oriented x 3 Psych: normal mood and affect   Data Reviewed: Imaging: Chest x-ray 10/01/2021-atelectasis, scarring at the left lateral lung base which is stable compared to previous chest x-ray on 09/17/2021.   Cardiac CT 11/26/2021-personally visualized by me.  Atelectatic change in the anterior aspect of the left lower lobe  CT chest 06/17/2022-bandlike scarring in the right middle lobe and lingula. I have reviewed the images personally.  PFTs: 12/27/2021 FVC 2.05 [84%], FEV1 1.46 [76%], F/F 71, TLC 4.89 [99%], DLCO 14.10 [73%] Minimal diffusion defect.  Bronchodilator response with air trapping.  Labs: CBC 10/20/2021- WBC 10, eos 1%, absolute eosinophil count 100  Assessment & Plan Asthma Asthma is not well-controlled on current Advair  250/50 dose. Insurance does not cover Symbicort . Increased use of albuterol  inhaler. Discussed potential switch to Airsupra  as a rescue inhaler, which contains albuterol  and a  steroid, offering better control than albuterol  alone. Airsupra  does not replace Advair  but can replace albuterol  inhaler. Consideration of increasing Advair  dose to improve asthma control. Airsupra  is supported by newer studies showing better efficacy than albuterol  alone. - Increase Advair  dose to 500/50 - Check insurance coverage for Airsupra . - Initiate preauthorization for Airsupra  if not covered by insurance. - Provide coupons for Airsupra  if insurance does not cover.   Abnormal chest x-ray She had treatment for pneumonia in December 2022 with follow-up chest x-ray showing persistent left lower lobe opacity/atelectasis  We reviewed with minimal linear scarring which postinflammatory.  The findings are not concerning.  There is no evidence of interstitial lung disease  OSA Continue CPAP.  Managed by [facility] neurology  Plan/Recommendations: Increase Advair  dose, start Airsupra    Windie Marasco MD [facility] 04/02/2024, 10:33 AM  CC: Norleen Lynwood ORN, MD

## 2024-04-02 NOTE — Patient Instructions (Signed)
 VISIT SUMMARY:  Today, we discussed your asthma management and recent gastrointestinal issues. You mentioned that Advair  is not controlling your asthma as well as Symbicort  did, and you are using your albuterol  inhaler more frequently. We also reviewed your recent diagnosis of low iron  levels and gastrointestinal bleeding, for which you had an endoscopy and colonoscopy last week.  YOUR PLAN:  -ASTHMA: Your asthma is not well-controlled with the current Advair  dose of 250/50. We discussed switching your rescue inhaler to Airsupra, which contains both albuterol  and a steroid, offering better control than albuterol  alone. Airsupra will not replace Advair  but can replace your albuterol  inhaler. We will also consider increasing your Advair  dose to improve asthma control. We will check your insurance coverage for Airsupra and initiate preauthorization if it is not covered. Additionally, we will provide coupons for Airsupra if needed.  -GASTROINTESTINAL BLEEDING: You have been experiencing gastrointestinal bleeding, which has led to low iron  levels and fatigue. You recently had an endoscopy and colonoscopy to investigate the source of the bleeding. We will continue to monitor your condition and manage your iron  levels to help alleviate your fatigue.  INSTRUCTIONS:  We will check your insurance coverage for Airsupra and initiate preauthorization if it is not covered. Please use the provided coupons for Airsupra if your insurance does not cover it. Continue to monitor your symptoms and follow up with us  if you experience any changes or worsening of your condition.

## 2024-04-04 ENCOUNTER — Other Ambulatory Visit (HOSPITAL_COMMUNITY): Payer: Self-pay

## 2024-04-04 MED ORDER — TORSEMIDE 20 MG PO TABS
20.0000 mg | ORAL_TABLET | Freq: Every day | ORAL | 1 refills | Status: AC | PRN
Start: 2024-04-04 — End: ?
  Filled 2024-04-04: qty 90, 90d supply, fill #0

## 2024-04-04 MED ORDER — POTASSIUM CHLORIDE CRYS ER 20 MEQ PO TBCR: 20.0000 meq | EXTENDED_RELEASE_TABLET | Freq: Every day | ORAL | 1 refills | Status: AC | PRN

## 2024-04-05 ENCOUNTER — Other Ambulatory Visit (HOSPITAL_COMMUNITY): Payer: Self-pay

## 2024-04-05 MED ORDER — MIEBO 1.338 GM/ML OP SOLN
1.0000 [drp] | Freq: Four times a day (QID) | OPHTHALMIC | 10 refills | Status: DC
  Filled 2024-04-05: qty 3, 10d supply, fill #0
  Filled 2024-05-17: qty 3, 25d supply, fill #1
  Filled 2024-06-04: qty 3, 25d supply, fill #2

## 2024-04-19 ENCOUNTER — Other Ambulatory Visit (HOSPITAL_COMMUNITY): Payer: Self-pay

## 2024-04-26 ENCOUNTER — Telehealth: Payer: Self-pay

## 2024-04-26 ENCOUNTER — Encounter (HOSPITAL_BASED_OUTPATIENT_CLINIC_OR_DEPARTMENT_OTHER): Payer: Self-pay | Admitting: Cardiovascular Disease

## 2024-04-26 NOTE — Telephone Encounter (Signed)
   Pre-operative Risk Assessment    Patient Name: Noni Stonesifer  DOB: 02/14/1956 MRN: [id number]   Date of last office visit: 11/24/23 Date of next office visit: Not scheduled   Request for Surgical Clearance    Procedure:  Right total knee arthroplasty  Date of Surgery:  Clearance 07/23/24                                Surgeon:  Dr. Donnice Car Surgeon's Group or Practice Name:  [facility] Phone number:  [phone] Fax number:  [phone]   Type of Clearance Requested:   - Medical    Type of Anesthesia:  Spinal   Additional requests/questions:    Bonney Ival LOISE Gerome   04/26/2024, 8:51 AM

## 2024-04-26 NOTE — Telephone Encounter (Signed)
 Patient states she was returning call. Please advise  ?

## 2024-04-26 NOTE — Telephone Encounter (Signed)
 Preop televisit now scheduled, med rec and consent done.

## 2024-04-26 NOTE — Telephone Encounter (Signed)
  Patient Consent for Virtual Visit        Suzanne Hansen has provided verbal consent on 04/26/2024 for a virtual visit (video or telephone).   CONSENT FOR VIRTUAL VISIT FOR:  Suzanne Hansen  By participating in this virtual visit I agree to the following:  I hereby voluntarily request, consent and authorize [facility] and its employed or contracted physicians, physician assistants, nurse practitioners or other licensed health care professionals (the Practitioner), to provide me with telemedicine health care services (the "Services) as deemed necessary by the treating Practitioner. I acknowledge and consent to receive the Services by the Practitioner via telemedicine. I understand that the telemedicine visit will involve communicating with the Practitioner through live audiovisual communication technology and the disclosure of certain medical information by electronic transmission. I acknowledge that I have been given the opportunity to request an in-person assessment or other available alternative prior to the telemedicine visit and am voluntarily participating in the telemedicine visit.  I understand that I have the right to withhold or withdraw my consent to the use of telemedicine in the course of my care at any time, without affecting my right to future care or treatment, and that the Practitioner or I may terminate the telemedicine visit at any time. I understand that I have the right to inspect all information obtained and/or recorded in the course of the telemedicine visit and may receive copies of available information for a reasonable fee.  I understand that some of the potential risks of receiving the Services via telemedicine include:  Delay or interruption in medical evaluation due to technological equipment failure or disruption; Information transmitted may not be sufficient (e.g. poor resolution of images) to allow for appropriate medical decision making by the Practitioner;  and/or  In rare instances, security protocols could fail, causing a breach of personal health information.  Furthermore, I acknowledge that it is my responsibility to provide information about my medical history, conditions and care that is complete and accurate to the best of my ability. I acknowledge that Practitioner's advice, recommendations, and/or decision may be based on factors not within their control, such as incomplete or inaccurate data provided by me or distortions of diagnostic images or specimens that may result from electronic transmissions. I understand that the practice of medicine is not an exact science and that Practitioner makes no warranties or guarantees regarding treatment outcomes. I acknowledge that a copy of this consent can be made available to me via my patient portal [facility] MyChart), or I can request a printed copy by calling the office of [facility].    I understand that my insurance will be billed for this visit.   I have read or had this consent read to me. I understand the contents of this consent, which adequately explains the benefits and risks of the Services being provided via telemedicine.  I have been provided ample opportunity to ask questions regarding this consent and the Services and have had my questions answered to my satisfaction. I give my informed consent for the services to be provided through the use of telemedicine in my medical care

## 2024-04-26 NOTE — Telephone Encounter (Signed)
   Name: Suzanne Hansen  DOB: 15-Aug-1956  MRN: [id number]  Primary Cardiologist: None   Preoperative team, please contact this patient and set up a phone call appointment for further preoperative risk assessment. Please obtain consent and complete medication review. Thank you for your help.  I confirm that guidance regarding antiplatelet and oral anticoagulation therapy has been completed and, if necessary, noted below.  I also confirmed the patient resides in the state of [place] . As per [facility] [employer] telemedicine laws, the patient must reside in the state in which the provider is licensed.   Artist Pouch, PA-C 04/26/2024, 9:05 AM  [facility]

## 2024-05-01 ENCOUNTER — Telehealth: Payer: Self-pay

## 2024-05-01 NOTE — Telephone Encounter (Signed)
 Surgical clearance form received, filled out, and faxed with pertinent information attached. Awaiting fax confirmation

## 2024-05-17 ENCOUNTER — Other Ambulatory Visit (HOSPITAL_COMMUNITY): Payer: Self-pay

## 2024-05-17 ENCOUNTER — Other Ambulatory Visit: Payer: Self-pay | Admitting: Internal Medicine

## 2024-05-17 ENCOUNTER — Encounter: Payer: Self-pay | Admitting: Internal Medicine

## 2024-05-17 ENCOUNTER — Other Ambulatory Visit: Payer: Self-pay

## 2024-05-17 ENCOUNTER — Ambulatory Visit: Admitting: Internal Medicine

## 2024-05-17 VITALS — BP 136/84 | HR 85 | Temp 98.3°F | Ht 63.5 in | Wt 200.2 lb

## 2024-05-17 DIAGNOSIS — E119 Type 2 diabetes mellitus without complications: Secondary | ICD-10-CM

## 2024-05-17 DIAGNOSIS — J019 Acute sinusitis, unspecified: Secondary | ICD-10-CM

## 2024-05-17 DIAGNOSIS — E559 Vitamin D deficiency, unspecified: Secondary | ICD-10-CM

## 2024-05-17 DIAGNOSIS — J309 Allergic rhinitis, unspecified: Secondary | ICD-10-CM

## 2024-05-17 DIAGNOSIS — Z7984 Long term (current) use of oral hypoglycemic drugs: Secondary | ICD-10-CM

## 2024-05-17 MED ORDER — DOXYCYCLINE HYCLATE 100 MG PO TABS
100.0000 mg | ORAL_TABLET | Freq: Two times a day (BID) | ORAL | 0 refills | Status: DC
  Filled 2024-05-17: qty 20, 10d supply, fill #0

## 2024-05-17 MED ORDER — POLYSACCHARIDE IRON COMPLEX 150 MG PO CAPS
150.0000 mg | ORAL_CAPSULE | Freq: Every day | ORAL | 1 refills | Status: AC
Start: 2024-05-17 — End: ?
  Filled 2024-05-17 – 2024-05-28 (×2): qty 90, 90d supply, fill #0
  Filled 2024-08-28: qty 90, 90d supply, fill #1

## 2024-05-17 MED ORDER — PREDNISONE 10 MG PO TABS
ORAL_TABLET | ORAL | 0 refills | Status: DC
  Filled 2024-05-17: qty 18, 9d supply, fill #0

## 2024-05-17 MED ORDER — HYDROCOD POLI-CHLORPHE POLI ER 10-8 MG/5ML PO SUER
5.0000 mL | Freq: Two times a day (BID) | ORAL | 0 refills | Status: DC | PRN
  Filled 2024-05-17: qty 115, 12d supply, fill #0

## 2024-05-17 MED ORDER — TIRZEPATIDE 10 MG/0.5ML ~~LOC~~ SOAJ
10.0000 mg | SUBCUTANEOUS | 3 refills | Status: DC
  Filled 2024-05-17 (×3): qty 6, 84d supply, fill #0
  Filled 2024-08-16: qty 6, 84d supply, fill #1

## 2024-05-17 NOTE — Patient Instructions (Signed)
 Please take all new medication as prescribed - the antibiotic, cough medicine, and prednisone   Ok to increase the Mounjaro  to 10 mg weekly  Please continue all other medications as before, and refills have been done if requested.  Please have the pharmacy call with any other refills you may need.  Please keep your appointments with your specialists as you may have planned

## 2024-05-17 NOTE — Assessment & Plan Note (Signed)
 Last vitamin D  Lab Results  Component Value Date   VD25OH 29.46 (L) 11/15/2023   Low, to start oral replacement

## 2024-05-17 NOTE — Assessment & Plan Note (Addendum)
 Mild to mod, for antibx course doxycycline  100 bid course,  tussionex prn, to f/u any worsening symptoms or concerns

## 2024-05-17 NOTE — Assessment & Plan Note (Signed)
 Mild to mod, for prednisone taper, to f/u any worsening symptoms or concerns

## 2024-05-17 NOTE — Progress Notes (Signed)
 Patient ID: Suzanne Hansen, female   DOB: Mar 10, 1956, 68 y.o.   MRN: [id number]        Chief Complaint: follow up sinusitis, allergies, dm, obesity, low vit d       HPI:  Suzanne Hansen is a 68 y.o. female  Here with 2-3 days acute onset fever, facial pain, pressure, headache, general weakness and malaise, and greenish d/c, with mild ST and cough, but pt denies chest pain, wheezing, increased sob or doe, orthopnea, PND, increased LE swelling, palpitations, dizziness or syncope. Does have several wks ongoing nasal allergy symptoms with clearish congestion, itch and sneezing, without fever, pain, ST, cough, swelling or wheezing.   Pt denies polydipsia, polyuria, or new focal neuro s/s.          Wt Readings from Last 3 Encounters:  05/17/24 200 lb 4 oz (90.8 kg)  04/02/24 203 lb (92.1 kg)  03/28/24 200 lb (90.7 kg)   BP Readings from Last 3 Encounters:  05/17/24 136/84  04/02/24 135/86  03/28/24 120/74         Past Medical History:  Diagnosis Date   ALLERGIC RHINITIS 05/09/2008   Allergy    ANXIETY 05/23/2007   ARM PAIN, LEFT 10/23/2009   Arthritis    ASTHMA 05/23/2007   Back pain    Cardiomyopathy (HCC) 09/08/2015   Cataract    Chest pain    CHF (congestive heart failure) (HCC)    Chronic diastolic heart failure (HCC) 01/22/2020   Constipation    CONTACT DERMATITIS 06/09/2009   Diabetes mellitus without complication (HCC)    type II   Dyspnea    allergies-dust, cig smoke, rag weeds   ENDOMETRIOSIS NOS 05/23/2007   FACIAL PAIN 06/02/2010   Food allergy    FREQUENCY, URINARY 05/17/2010   GERD (gastroesophageal reflux disease)    GLUCOSE INTOLERANCE 08/28/2009   HERPES SIMPLEX, UNCOMPLICATED 05/23/2007   Hyperlipidemia 08/28/2015   HYPERTHYROIDISM 05/23/2007   Hypoactive thyroid     HYPOTHYROIDISM 05/09/2008   Impaired glucose tolerance 01/28/2011   INSOMNIA, HX OF 05/23/2007   Joint pain    LATERAL EPICONDYLITIS, RIGHT 03/10/2009   LIPOMA 10/22/2010   NECK PAIN  11/10/2010   OBESITY 05/23/2007   Osteopenia 09/28/2018   Plantar fasciitis    Both feet   Pre-diabetes    SHOULDER PAIN, LEFT 05/17/2010   SINUSITIS- ACUTE-NOS 11/03/2008   SKIN LESION 11/10/2010   Sleep apnea    Stage III chronic kidney disease (HCC)    Stage III   Unspecified Chest Pain 07/25/2008   UNSPECIFIED URTICARIA 06/04/2009   URI 08/28/2009   UTI 04/29/2008   VITAMIN D  DEFICIENCY 08/28/2009   Wheezing 07/12/2010   Past Surgical History:  Procedure Laterality Date   BIOPSY OF SKIN SUBCUTANEOUS TISSUE AND/OR MUCOUS MEMBRANE  03/26/2024   Procedure: BIOPSY, SKIN, SUBCUTANEOUS TISSUE, OR MUCOUS MEMBRANE;  Surgeon: Shila Gustav GAILS, MD;  Location: [facility];  Service: Gastroenterology;;   COLONOSCOPY     COLONOSCOPY N/A 03/26/2024   Procedure: COLONOSCOPY;  Surgeon: Shila Gustav GAILS, MD;  Location: [facility];  Service: Gastroenterology;  Laterality: N/A;   COLONOSCOPY WITH PROPOFOL  N/A 04/25/2022   Procedure: COLONOSCOPY WITH PROPOFOL ;  Surgeon: Shila Gustav GAILS, MD;  Location: [facility];  Service: Gastroenterology;  Laterality: N/A;   ENDOMETRIAL ABLATION     ESOPHAGOGASTRODUODENOSCOPY N/A 03/26/2024   Procedure: EGD (ESOPHAGOGASTRODUODENOSCOPY);  Surgeon: Nandigam, Kavitha V, MD;  Location: [facility];  Service: Gastroenterology;  Laterality: N/A;   LAMINECTOMY  1996   LAPAROTOMY  exploratory   lower back surgery  07/06/2017   cyst   POLYPECTOMY     POLYPECTOMY  04/25/2022   Procedure: POLYPECTOMY;  Surgeon: Shila Gustav GAILS, MD;  Location: [facility];  Service: Gastroenterology;;   POLYPECTOMY  03/26/2024   Procedure: POLYPECTOMY, INTESTINE;  Surgeon: Shila Gustav GAILS, MD;  Location: [facility];  Service: Gastroenterology;;   s/p endometrial ablation  12/06   TUBAL LIGATION     UTERINE FIBROID SURGERY     WISDOM TOOTH EXTRACTION      reports that she quit smoking about 36 years ago. Her smoking use included cigarettes. She started  smoking about 48 years ago. She has a 6 pack-year smoking history. She has never used smokeless tobacco. She reports current alcohol use of about 1.0 standard drink of alcohol per week. She reports that she does not use drugs. family history includes COPD in her father; Cancer in her maternal grandmother; Depression in her mother; Diabetes in her cousin and sister; Heart disease in her cousin and mother; Hypertension in her cousin and father; Kidney disease in her sister. Allergies  Allergen Reactions   Shellfish Allergy Anaphylaxis   Clarithromycin Nausea And Vomiting   Latex Itching and Other (See Comments)    ITCHING AND COLD SORES AROUND MOUTH WHEN LATEX GLOVES USED BY THE DENTIST/HYGENTIST   Metronidazole  Nausea And Vomiting   Zostavax [Zoster Vaccine Live]     Arm swelled   Adhesive [Tape] Dermatitis    Plastic tape    Amiloride     foot numbness   Clindamycin Nausea Only and Nausea And Vomiting   Gadobenate     Pt has had some form of dye without issues but isnt sure if she is allergic to this specific agent    Lisinopril  Cough   Naproxen Sodium Other (See Comments)   Current Outpatient Medications on File Prior to Visit  Medication Sig Dispense Refill   acetaminophen  (TYLENOL ) 650 MG CR tablet Take by mouth.     albuterol  (VENTOLIN  HFA) 108 (90 Base) MCG/ACT inhaler Inhale 2 puffs into the lungs every 4 (four) hours as needed. 6.7 g 11   Albuterol -Budesonide  (AIRSUPRA ) 90-80 MCG/ACT AERO Inhale 2 puffs into the lungs every 4 (four) hours as needed. 10.7 g 3   allopurinol  (ZYLOPRIM ) 100 MG tablet Take 2 tablets (200 mg total) by mouth daily. 180 tablet 3   aspirin  81 MG EC tablet Take 81 mg by mouth daily.     Cholecalciferol  (VITAMIN D -1000 MAX ST) 25 MCG (1000 UT) tablet Take by mouth.     dapagliflozin  propanediol (FARXIGA ) 5 MG TABS tablet Take 1 tablet (5 mg total) by mouth daily before breakfast. 90 tablet 3   diclofenac  Sodium (PENNSAID ) 2 % SOLN Apply 2 Pumps (40 mg  total) topically  to affected knee(s) 2 (two) times daily. 112 g 0   estradiol (ESTRACE) 0.1 MG/GM vaginal cream Place 1 Applicatorful vaginally once a week.     ezetimibe  (ZETIA ) 10 MG tablet Take 1 tablet (10 mg total) by mouth daily. 90 tablet 3   fexofenadine (ALLEGRA) 180 MG tablet Take 180 mg by mouth daily.     fluconazole  (DIFLUCAN ) 150 MG tablet Take 1 tablet by mouth every 3 days as needed 2 tablet 1   fluticasone -salmeterol (ADVAIR  DISKUS) 500-50 MCG/ACT AEPB Inhale 1 puff into the lungs in the morning and at bedtime. 180 each 3   gabapentin  (NEURONTIN ) 100 MG capsule Take 1 capsule (100 mg total) by mouth 2 (two)  times daily. 60 capsule 1   levothyroxine  (SYNTHROID ) 125 MCG tablet Take 1 tablet (125 mcg total) by mouth daily. 90 tablet 3   losartan  (COZAAR ) 25 MG tablet Take 0.5 tablets (12.5 mg total) by mouth daily. NEED APPOINTMENT 45 tablet 3   magnesium oxide (MAG-OX) 400 MG tablet Take 400 mg by mouth daily.     metroNIDAZOLE  (METROCREAM ) 0.75 % cream Apply topically.     MIEBO  1.338 GM/ML SOLN      pantoprazole  (PROTONIX ) 40 MG tablet Take 1 tablet (40 mg total) by mouth 2 (two) times daily before a meal. 180 tablet 3   Perfluorohexyloctane  (MIEBO ) 1.338 GM/ML SOLN Instill 1 drop into both eyes four times a day as directed 3 mL 10   potassium chloride  SA (KLOR-CON  M) 20 MEQ tablet Take 1 tablet (20 mEq total) by mouth daily as needed. (Take with torsemide ) 90 tablet 1   torsemide  (DEMADEX ) 20 MG tablet Take 1 tablet (20 mg total) by mouth daily as needed. 90 tablet 1   traMADol  (ULTRAM ) 50 MG tablet Take 1 tablet (50 mg total) by mouth every 6 (six) hours as needed. 30 tablet 0   No current facility-administered medications on file prior to visit.        ROS:  All others reviewed and negative.  Objective        PE:  BP 136/84   Pulse 85   Temp 98.3 F (36.8 C) (Temporal)   Ht 5' 3.5 (1.613 m)   Wt 200 lb 4 oz (90.8 kg)   SpO2 95%   BMI 34.92 kg/m                  Constitutional: Pt appears in NAD               HENT: Head: NCAT.                Right Ear: External ear normal.                 Left Ear: External ear normal. Bilat tm's with mild erythema.  Max sinus areas mild tender.  Pharynx with mild erythema, no exudate               Eyes: . Pupils are equal, round, and reactive to light. Conjunctivae and EOM are normal               Nose: without d/c or deformity               Neck: Neck supple. Gross normal ROM               Cardiovascular: Normal rate and regular rhythm.                 Pulmonary/Chest: Effort normal and breath sounds without rales or wheezing.                               Neurological: Pt is alert. At baseline orientation, motor grossly intact               Skin: Skin is warm. No rashes, no other new lesions, LE edema - none               Psychiatric: Pt behavior is normal without agitation   Micro: none  Cardiac tracings I have personally interpreted today:  none  Pertinent Radiological findings (summarize): none   Lab Results  Component Value Date   WBC 9.1 03/28/2024   HGB 14.2 03/28/2024   HCT 43.5 03/28/2024   PLT 264.0 03/28/2024   GLUCOSE 89 03/28/2024   CHOL 152 03/28/2024   TRIG 87.0 03/28/2024   HDL 77.00 03/28/2024   LDLCALC 57 03/28/2024   ALT 15 03/28/2024   AST 19 03/28/2024   NA 143 03/28/2024   K 3.9 03/28/2024   CL 106 03/28/2024   CREATININE 1.43 (H) 03/28/2024   BUN 20 03/28/2024   CO2 30 03/28/2024   TSH 0.01 (L) 11/15/2023   INR 1.0 03/21/2017   HGBA1C 5.2 03/28/2024   Assessment/Plan:  Suzanne Hansen is a 68 y.o. Black or African American [2] female with  has a past medical history of ALLERGIC RHINITIS (05/09/2008), Allergy, ANXIETY (05/23/2007), ARM PAIN, LEFT (10/23/2009), Arthritis, ASTHMA (05/23/2007), Back pain, Cardiomyopathy (HCC) (09/08/2015), Cataract, Chest pain, CHF (congestive heart failure) (HCC), Chronic diastolic heart failure (HCC) (95/85/7978), Constipation, CONTACT  DERMATITIS (06/09/2009), Diabetes mellitus without complication (HCC), Dyspnea, ENDOMETRIOSIS NOS (05/23/2007), FACIAL PAIN (06/02/2010), Food allergy, FREQUENCY, URINARY (05/17/2010), GERD (gastroesophageal reflux disease), GLUCOSE INTOLERANCE (08/28/2009), HERPES SIMPLEX, UNCOMPLICATED (05/23/2007), Hyperlipidemia (08/28/2015), HYPERTHYROIDISM (05/23/2007), Hypoactive thyroid , HYPOTHYROIDISM (05/09/2008), Impaired glucose tolerance (01/28/2011), INSOMNIA, HX OF (05/23/2007), Joint pain, LATERAL EPICONDYLITIS, RIGHT (03/10/2009), LIPOMA (10/22/2010), NECK PAIN (11/10/2010), OBESITY (05/23/2007), Osteopenia (09/28/2018), Plantar fasciitis, Pre-diabetes, SHOULDER PAIN, LEFT (05/17/2010), SINUSITIS- ACUTE-NOS (11/03/2008), SKIN LESION (11/10/2010), Sleep apnea, Stage III chronic kidney disease (HCC), Unspecified Chest Pain (07/25/2008), UNSPECIFIED URTICARIA (06/04/2009), URI (08/28/2009), UTI (04/29/2008), VITAMIN D  DEFICIENCY (08/28/2009), and Wheezing (07/12/2010).  Vitamin D  deficiency Last vitamin D  Lab Results  Component Value Date   VD25OH 29.46 (L) 11/15/2023   Low, to start oral replacement   Diabetes (HCC) Lab Results  Component Value Date   HGBA1C 5.2 03/28/2024   With uncontrolled obesity, pt to continue current medical treatment farxiga  5 mg every day, but increased mounjaro  to 10 mg weekly   Allergic rhinitis Mild to mod, for prednisone  taper,  to f/u any worsening symptoms or concerns  Acute sinus infection Mild to mod, for antibx course doxycycline  100 bid course,  tussionex prn, to f/u any worsening symptoms or concerns  Followup: Return if symptoms worsen or fail to improve.  Lynwood Rush, MD 05/17/2024 9:29 PM [facility]

## 2024-05-17 NOTE — Assessment & Plan Note (Signed)
 Lab Results  Component Value Date   HGBA1C 5.2 03/28/2024   With uncontrolled obesity, pt to continue current medical treatment farxiga  5 mg every day, but increased mounjaro  to 10 mg weekly

## 2024-05-20 ENCOUNTER — Other Ambulatory Visit: Payer: Self-pay

## 2024-05-27 ENCOUNTER — Other Ambulatory Visit (HOSPITAL_COMMUNITY): Payer: Self-pay

## 2024-05-27 ENCOUNTER — Other Ambulatory Visit: Payer: Self-pay | Admitting: Internal Medicine

## 2024-05-27 MED ORDER — FLUCONAZOLE 150 MG PO TABS
150.0000 mg | ORAL_TABLET | ORAL | 0 refills | Status: DC | PRN
  Filled 2024-05-27: qty 2, 3d supply, fill #0

## 2024-05-27 MED ORDER — FLUCONAZOLE 150 MG PO TABS
150.0000 mg | ORAL_TABLET | ORAL | 1 refills | Status: DC
  Filled 2024-05-27: qty 2, 6d supply, fill #0
  Filled 2024-06-04: qty 2, 6d supply, fill #1

## 2024-05-27 MED ORDER — VEOZAH 45 MG PO TABS
45.0000 mg | ORAL_TABLET | Freq: Every day | ORAL | 3 refills | Status: DC
  Filled 2024-05-27: qty 30, 30d supply, fill #0
  Filled 2024-06-29: qty 30, 30d supply, fill #1

## 2024-05-27 MED ORDER — NYSTATIN-TRIAMCINOLONE 100000-0.1 UNIT/GM-% EX CREA
TOPICAL_CREAM | CUTANEOUS | 1 refills | Status: DC
  Filled 2024-05-27: qty 30, 30d supply, fill #0

## 2024-05-28 ENCOUNTER — Other Ambulatory Visit (HOSPITAL_COMMUNITY): Payer: Self-pay

## 2024-05-28 ENCOUNTER — Other Ambulatory Visit: Payer: Self-pay

## 2024-05-29 ENCOUNTER — Other Ambulatory Visit (HOSPITAL_COMMUNITY): Payer: Self-pay

## 2024-05-29 MED ORDER — ESTRADIOL 0.1 MG/GM VA CREA
TOPICAL_CREAM | VAGINAL | 3 refills | Status: DC
  Filled 2024-05-29: qty 42.5, 90d supply, fill #0

## 2024-05-30 ENCOUNTER — Other Ambulatory Visit (HOSPITAL_COMMUNITY): Payer: Self-pay

## 2024-06-03 ENCOUNTER — Other Ambulatory Visit: Payer: Self-pay

## 2024-06-03 ENCOUNTER — Other Ambulatory Visit (HOSPITAL_COMMUNITY): Payer: Self-pay

## 2024-06-04 ENCOUNTER — Other Ambulatory Visit: Payer: Self-pay

## 2024-06-04 ENCOUNTER — Other Ambulatory Visit (HOSPITAL_COMMUNITY): Payer: Self-pay

## 2024-06-04 MED ORDER — DICLOFENAC SODIUM 2 % EX SOLN
CUTANEOUS | 1 refills | Status: AC
  Filled 2024-06-04: qty 112, 30d supply, fill #0

## 2024-06-05 ENCOUNTER — Other Ambulatory Visit (HOSPITAL_COMMUNITY): Payer: Self-pay

## 2024-06-13 ENCOUNTER — Other Ambulatory Visit (HOSPITAL_COMMUNITY): Payer: Self-pay

## 2024-06-14 ENCOUNTER — Other Ambulatory Visit (HOSPITAL_COMMUNITY): Payer: Self-pay

## 2024-06-14 NOTE — Telephone Encounter (Signed)
  Patient called and rescheduled her appointment as an in-office visit. She mentioned there's an issue with her insurance, so she might not need clearance. She is still waiting for Medicare to return her call.

## 2024-06-14 NOTE — Telephone Encounter (Signed)
 Pt called canceled TELE Preop appt and scheduled IN OFFICE appt for 07/08/24 with Reche Finder, NP and at that time preop clearance will be addressed.

## 2024-06-17 ENCOUNTER — Ambulatory Visit: Admitting: Internal Medicine

## 2024-06-17 ENCOUNTER — Encounter: Payer: Self-pay | Admitting: Internal Medicine

## 2024-06-17 ENCOUNTER — Ambulatory Visit: Payer: Self-pay | Admitting: Internal Medicine

## 2024-06-17 VITALS — BP 130/86 | HR 76 | Temp 97.8°F | Ht 63.5 in | Wt 202.6 lb

## 2024-06-17 DIAGNOSIS — Z23 Encounter for immunization: Secondary | ICD-10-CM

## 2024-06-17 DIAGNOSIS — I10 Essential (primary) hypertension: Secondary | ICD-10-CM

## 2024-06-17 DIAGNOSIS — E559 Vitamin D deficiency, unspecified: Secondary | ICD-10-CM | POA: Diagnosis not present

## 2024-06-17 DIAGNOSIS — S60032A Contusion of left middle finger without damage to nail, initial encounter: Secondary | ICD-10-CM | POA: Diagnosis not present

## 2024-06-17 DIAGNOSIS — N1831 Chronic kidney disease, stage 3a: Secondary | ICD-10-CM | POA: Diagnosis not present

## 2024-06-17 DIAGNOSIS — E538 Deficiency of other specified B group vitamins: Secondary | ICD-10-CM | POA: Diagnosis not present

## 2024-06-17 DIAGNOSIS — E7800 Pure hypercholesterolemia, unspecified: Secondary | ICD-10-CM

## 2024-06-17 DIAGNOSIS — E119 Type 2 diabetes mellitus without complications: Secondary | ICD-10-CM

## 2024-06-17 DIAGNOSIS — Z7985 Long-term (current) use of injectable non-insulin antidiabetic drugs: Secondary | ICD-10-CM

## 2024-06-17 LAB — CBC WITH DIFFERENTIAL/PLATELET
Basophils Absolute: 0.1 K/uL (ref 0.0–0.1)
Basophils Relative: 0.8 % (ref 0.0–3.0)
Eosinophils Absolute: 0.1 K/uL (ref 0.0–0.7)
Eosinophils Relative: 1 % (ref 0.0–5.0)
HCT: 43.9 % (ref 36.0–46.0)
Hemoglobin: 14.7 g/dL (ref 12.0–15.0)
Lymphocytes Relative: 36 % (ref 12.0–46.0)
Lymphs Abs: 2.5 K/uL (ref 0.7–4.0)
MCHC: 33.5 g/dL (ref 30.0–36.0)
MCV: 89.3 fl (ref 78.0–100.0)
Monocytes Absolute: 0.6 K/uL (ref 0.1–1.0)
Monocytes Relative: 8.2 % (ref 3.0–12.0)
Neutro Abs: 3.8 K/uL (ref 1.4–7.7)
Neutrophils Relative %: 54 % (ref 43.0–77.0)
Platelets: 248 K/uL (ref 150.0–400.0)
RBC: 4.91 Mil/uL (ref 3.87–5.11)
RDW: 14.2 % (ref 11.5–15.5)
WBC: 7 K/uL (ref 4.0–10.5)

## 2024-06-17 LAB — BASIC METABOLIC PANEL WITH GFR
BUN: 17 mg/dL (ref 6–23)
CO2: 29 meq/L (ref 19–32)
Calcium: 9.9 mg/dL (ref 8.4–10.5)
Chloride: 104 meq/L (ref 96–112)
Creatinine, Ser: 1.16 mg/dL (ref 0.40–1.20)
GFR: 48.89 mL/min — ABNORMAL LOW (ref >60.00)
Glucose, Bld: 80 mg/dL (ref 70–99)
Potassium: 4 meq/L (ref 3.5–5.1)
Sodium: 141 meq/L (ref 135–145)

## 2024-06-17 LAB — HEPATIC FUNCTION PANEL
ALT: 11 U/L (ref 0–35)
AST: 12 U/L (ref 0–37)
Albumin: 4.2 g/dL (ref 3.5–5.2)
Alkaline Phosphatase: 73 U/L (ref 39–117)
Bilirubin, Direct: 0.2 mg/dL (ref 0.0–0.3)
Total Bilirubin: 1 mg/dL (ref 0.2–1.2)
Total Protein: 6.6 g/dL (ref 6.0–8.3)

## 2024-06-17 NOTE — Progress Notes (Unsigned)
 Patient ID: Suzanne Hansen, female   DOB: April 13, 1956, 68 y.o.   MRN: [id number]        Chief Complaint: follow up finger contusion, dm with obesity, htn, hld, low vit d, ckd3a       HPI:  Suzanne Hansen is a 68 y.o. female here with c/o large contusion area left hand involving starting from mcp to the DIP but can remember no specific trauma or overuse.  No other bruising.  Pt denies chest pain, increased sob or doe, wheezing, orthopnea, PND, increased LE swelling, palpitations, dizziness or syncope.   Pt denies polydipsia, polyuria, or new focal neuro s/s.  Due for flu shot.  Wt overall stable recently on 10 mg mounajro. Wt Readings from Last 3 Encounters:  06/17/24 202 lb 9.6 oz (91.9 kg)  05/17/24 200 lb 4 oz (90.8 kg)  04/02/24 203 lb (92.1 kg)   BP Readings from Last 3 Encounters:  06/17/24 130/86  05/17/24 136/84  04/02/24 135/86         Past Medical History:  Diagnosis Date   ALLERGIC RHINITIS 05/09/2008   Allergy    ANXIETY 05/23/2007   ARM PAIN, LEFT 10/23/2009   Arthritis    ASTHMA 05/23/2007   Back pain    Cardiomyopathy (HCC) 09/08/2015   Cataract    Chest pain    CHF (congestive heart failure) (HCC)    Chronic diastolic heart failure (HCC) 01/22/2020   Constipation    CONTACT DERMATITIS 06/09/2009   Diabetes mellitus without complication (HCC)    type II   Dyspnea    allergies-dust, cig smoke, rag weeds   ENDOMETRIOSIS NOS 05/23/2007   FACIAL PAIN 06/02/2010   Food allergy    FREQUENCY, URINARY 05/17/2010   GERD (gastroesophageal reflux disease)    GLUCOSE INTOLERANCE 08/28/2009   HERPES SIMPLEX, UNCOMPLICATED 05/23/2007   Hyperlipidemia 08/28/2015   HYPERTHYROIDISM 05/23/2007   Hypoactive thyroid     HYPOTHYROIDISM 05/09/2008   Impaired glucose tolerance 01/28/2011   INSOMNIA, HX OF 05/23/2007   Joint pain    LATERAL EPICONDYLITIS, RIGHT 03/10/2009   LIPOMA 10/22/2010   NECK PAIN 11/10/2010   OBESITY 05/23/2007   Osteopenia 09/28/2018   Plantar  fasciitis    Both feet   Pre-diabetes    SHOULDER PAIN, LEFT 05/17/2010   SINUSITIS- ACUTE-NOS 11/03/2008   SKIN LESION 11/10/2010   Sleep apnea    Stage III chronic kidney disease (HCC)    Stage III   Unspecified Chest Pain 07/25/2008   UNSPECIFIED URTICARIA 06/04/2009   URI 08/28/2009   UTI 04/29/2008   VITAMIN D  DEFICIENCY 08/28/2009   Wheezing 07/12/2010   Past Surgical History:  Procedure Laterality Date   BIOPSY OF SKIN SUBCUTANEOUS TISSUE AND/OR MUCOUS MEMBRANE  03/26/2024   Procedure: BIOPSY, SKIN, SUBCUTANEOUS TISSUE, OR MUCOUS MEMBRANE;  Surgeon: Shila Gustav GAILS, MD;  Location: [facility];  Service: Gastroenterology;;   COLONOSCOPY     COLONOSCOPY N/A 03/26/2024   Procedure: COLONOSCOPY;  Surgeon: Shila Gustav GAILS, MD;  Location: [facility];  Service: Gastroenterology;  Laterality: N/A;   COLONOSCOPY WITH PROPOFOL  N/A 04/25/2022   Procedure: COLONOSCOPY WITH PROPOFOL ;  Surgeon: Shila Gustav GAILS, MD;  Location: [facility];  Service: Gastroenterology;  Laterality: N/A;   ENDOMETRIAL ABLATION     ESOPHAGOGASTRODUODENOSCOPY N/A 03/26/2024   Procedure: EGD (ESOPHAGOGASTRODUODENOSCOPY);  Surgeon: Nandigam, Kavitha V, MD;  Location: [facility];  Service: Gastroenterology;  Laterality: N/A;   LAMINECTOMY  1996   LAPAROTOMY     exploratory   lower back  surgery  07/06/2017   cyst   POLYPECTOMY     POLYPECTOMY  04/25/2022   Procedure: POLYPECTOMY;  Surgeon: Shila Gustav GAILS, MD;  Location: [facility];  Service: Gastroenterology;;   POLYPECTOMY  03/26/2024   Procedure: POLYPECTOMY, INTESTINE;  Surgeon: Shila Gustav GAILS, MD;  Location: [facility];  Service: Gastroenterology;;   s/p endometrial ablation  12/06   TUBAL LIGATION     UTERINE FIBROID SURGERY     WISDOM TOOTH EXTRACTION      reports that she quit smoking about 36 years ago. Her smoking use included cigarettes. She started smoking about 48 years ago. She has a 6 pack-year smoking history. She  has never used smokeless tobacco. She reports current alcohol use of about 1.0 standard drink of alcohol per week. She reports that she does not use drugs. family history includes COPD in her father; Cancer in her maternal grandmother; Depression in her mother; Diabetes in her cousin and sister; Heart disease in her cousin and mother; Hypertension in her cousin and father; Kidney disease in her sister. Allergies  Allergen Reactions   Shellfish Allergy Anaphylaxis   Clarithromycin Nausea And Vomiting   Latex Itching and Other (See Comments)    ITCHING AND COLD SORES AROUND MOUTH WHEN LATEX GLOVES USED BY THE DENTIST/HYGENTIST   Metronidazole  Nausea And Vomiting   Zostavax [Zoster Vaccine Live]     Arm swelled   Adhesive [Tape] Dermatitis    Plastic tape    Amiloride     foot numbness   Clindamycin Nausea Only and Nausea And Vomiting   Gadobenate     Pt has had some form of dye without issues but isnt sure if she is allergic to this specific agent    Lisinopril  Cough   Naproxen Sodium Other (See Comments)   Current Outpatient Medications on File Prior to Visit  Medication Sig Dispense Refill   acetaminophen  (TYLENOL ) 650 MG CR tablet Take by mouth.     albuterol  (VENTOLIN  HFA) 108 (90 Base) MCG/ACT inhaler Inhale 2 puffs into the lungs every 4 (four) hours as needed. 6.7 g 11   Albuterol -Budesonide  (AIRSUPRA ) 90-80 MCG/ACT AERO Inhale 2 puffs into the lungs every 4 (four) hours as needed. 10.7 g 3   allopurinol  (ZYLOPRIM ) 100 MG tablet Take 2 tablets (200 mg total) by mouth daily. 180 tablet 3   aspirin  81 MG EC tablet Take 81 mg by mouth daily.     chlorpheniramine -HYDROcodone  (TUSSIONEX) 10-8 MG/5ML Take 5 mLs by mouth every 12 (twelve) hours as needed for cough. 115 mL 0   Cholecalciferol  (VITAMIN D -1000 MAX ST) 25 MCG (1000 UT) tablet Take by mouth.     dapagliflozin  propanediol (FARXIGA ) 5 MG TABS tablet Take 1 tablet (5 mg total) by mouth daily before breakfast. 90 tablet 3    diclofenac  Sodium (PENNSAID ) 2 % SOLN Apply 2 Pumps (40 mg total) topically to affected knee(s) 2 (two) times daily. 112 g 1   doxycycline  (VIBRA -TABS) 100 MG tablet Take 1 tablet (100 mg total) by mouth 2 (two) times daily. 20 tablet 0   estradiol  (ESTRACE ) 0.1 MG/GM vaginal cream Place 1 Applicatorful vaginally once a week.     estradiol  (ESTRACE ) 0.1 MG/GM vaginal cream Insert 0.5 g into vagina nightly 2-3 times per week. 42.5 g 3   ezetimibe  (ZETIA ) 10 MG tablet Take 1 tablet (10 mg total) by mouth daily. 90 tablet 3   fexofenadine (ALLEGRA) 180 MG tablet Take 180 mg by mouth daily.  Fezolinetant  (VEOZAH ) 45 MG TABS Take 1 tablet (45 mg total) by mouth daily. 30 tablet 3   fluconazole  (DIFLUCAN ) 150 MG tablet Take 1 tablet (150 mg total) by mouth every 3 (three) days. 2 tablet 1   fluconazole  (DIFLUCAN ) 150 MG tablet Take 1 tablet (150 mg total) by mouth every 3 (three) days as needed. 2 tablet 0   fluticasone -salmeterol (ADVAIR  DISKUS) 500-50 MCG/ACT AEPB Inhale 1 puff into the lungs in the morning and at bedtime. 180 each 3   gabapentin  (NEURONTIN ) 100 MG capsule Take 1 capsule (100 mg total) by mouth 2 (two) times daily. 60 capsule 1   iron  polysaccharides (POLY-IRON  150) 150 MG capsule Take 1 capsule (150 mg total) by mouth daily. 90 capsule 1   levothyroxine  (SYNTHROID ) 125 MCG tablet Take 1 tablet (125 mcg total) by mouth daily. 90 tablet 3   losartan  (COZAAR ) 25 MG tablet Take 0.5 tablets (12.5 mg total) by mouth daily. NEED APPOINTMENT 45 tablet 3   magnesium oxide (MAG-OX) 400 MG tablet Take 400 mg by mouth daily.     metroNIDAZOLE  (METROCREAM ) 0.75 % cream Apply topically.     MIEBO  1.338 GM/ML SOLN      nystatin -triamcinolone  (MYCOLOG II) cream APPLY TO THE AFFECTED AREAS 2 TIMES PER DAY IN THE MORNING AND EVENING 30 g 1   pantoprazole  (PROTONIX ) 40 MG tablet Take 1 tablet (40 mg total) by mouth 2 (two) times daily before a meal. 180 tablet 3   Perfluorohexyloctane  (MIEBO ) 1.338  GM/ML SOLN Instill 1 drop into both eyes four times a day as directed 3 mL 10   potassium chloride  SA (KLOR-CON  M) 20 MEQ tablet Take 1 tablet (20 mEq total) by mouth daily as needed. (Take with torsemide ) 90 tablet 1   tirzepatide  (MOUNJARO ) 10 MG/0.5ML Pen Inject 10 mg into the skin once a week. 6 mL 3   torsemide  (DEMADEX ) 20 MG tablet Take 1 tablet (20 mg total) by mouth daily as needed. 90 tablet 1   predniSONE  (DELTASONE ) 10 MG tablet Take 3 tablets by mouth daily for 3 days, THEN take 2 tablets daily for 3 days, THEN take 1 tablet daily for 3 days (Patient not taking: Reported on 06/17/2024) 18 tablet 0   traMADol  (ULTRAM ) 50 MG tablet Take 1 tablet (50 mg total) by mouth every 6 (six) hours as needed. (Patient not taking: Reported on 06/17/2024) 30 tablet 0   No current facility-administered medications on file prior to visit.        ROS:  All others reviewed and negative.  Objective        PE:  BP 130/86   Pulse 76   Temp 97.8 F (36.6 C)   Ht 5' 3.5 (1.613 m)   Wt 202 lb 9.6 oz (91.9 kg)   SpO2 99%   BMI 35.33 kg/m                 Constitutional: Pt appears in NAD               HENT: Head: NCAT.                Right Ear: External ear normal.                 Left Ear: External ear normal.                Eyes: . Pupils are equal, round, and reactive to light. Conjunctivae and EOM are normal  Nose: without d/c or deformity               Neck: Neck supple. Gross normal ROM               Cardiovascular: Normal rate and regular rhythm.                 Pulmonary/Chest: Effort normal and breath sounds without rales or wheezing.                Abd:  Soft, NT, ND, + BS, no organomegaly               Neurological: Pt is alert. At baseline orientation, motor grossly intact               Skin: Skin is warm. No rashes,  LE edema - none, large contusion third finger left hand               Psychiatric: Pt behavior is normal without agitation   Micro: none  Cardiac tracings  I have personally interpreted today:  none  Pertinent Radiological findings (summarize): none   Lab Results  Component Value Date   WBC 7.0 06/17/2024   HGB 14.7 06/17/2024   HCT 43.9 06/17/2024   PLT 248.0 06/17/2024   GLUCOSE 80 06/17/2024   CHOL 152 03/28/2024   TRIG 87.0 03/28/2024   HDL 77.00 03/28/2024   LDLCALC 57 03/28/2024   ALT 11 06/17/2024   AST 12 06/17/2024   NA 141 06/17/2024   K 4.0 06/17/2024   CL 104 06/17/2024   CREATININE 1.16 06/17/2024   BUN 17 06/17/2024   CO2 29 06/17/2024   TSH 0.01 (L) 11/15/2023   INR 1.0 03/21/2017   HGBA1C 5.2 03/28/2024   Assessment/Plan:  Laneah Muma is a 68 y.o. Black or African American [2] female with  has a past medical history of ALLERGIC RHINITIS (05/09/2008), Allergy, ANXIETY (05/23/2007), ARM PAIN, LEFT (10/23/2009), Arthritis, ASTHMA (05/23/2007), Back pain, Cardiomyopathy (HCC) (09/08/2015), Cataract, Chest pain, CHF (congestive heart failure) (HCC), Chronic diastolic heart failure (HCC) (95/85/7978), Constipation, CONTACT DERMATITIS (06/09/2009), Diabetes mellitus without complication (HCC), Dyspnea, ENDOMETRIOSIS NOS (05/23/2007), FACIAL PAIN (06/02/2010), Food allergy, FREQUENCY, URINARY (05/17/2010), GERD (gastroesophageal reflux disease), GLUCOSE INTOLERANCE (08/28/2009), HERPES SIMPLEX, UNCOMPLICATED (05/23/2007), Hyperlipidemia (08/28/2015), HYPERTHYROIDISM (05/23/2007), Hypoactive thyroid , HYPOTHYROIDISM (05/09/2008), Impaired glucose tolerance (01/28/2011), INSOMNIA, HX OF (05/23/2007), Joint pain, LATERAL EPICONDYLITIS, RIGHT (03/10/2009), LIPOMA (10/22/2010), NECK PAIN (11/10/2010), OBESITY (05/23/2007), Osteopenia (09/28/2018), Plantar fasciitis, Pre-diabetes, SHOULDER PAIN, LEFT (05/17/2010), SINUSITIS- ACUTE-NOS (11/03/2008), SKIN LESION (11/10/2010), Sleep apnea, Stage III chronic kidney disease (HCC), Unspecified Chest Pain (07/25/2008), UNSPECIFIED URTICARIA (06/04/2009), URI (08/28/2009), UTI (04/29/2008),  VITAMIN D  DEFICIENCY (08/28/2009), and Wheezing (07/12/2010).  B12 deficiency Lab Results  Component Value Date   VITAMINB12 330 12/25/2023   Stable, cont oral replacement - b12 1000 mcg qd   CKD (chronic kidney disease) Lab Results  Component Value Date   CREATININE 1.16 06/17/2024   Stable overall, cont to avoid nephrotoxins   Diabetes (HCC) Lab Results  Component Value Date   HGBA1C 5.2 03/28/2024   Stable, pt to continue current medical treatment farxiga  5 every day, mounjaro  10 weekly   Essential (primary) hypertension BP Readings from Last 3 Encounters:  06/17/24 130/86  05/17/24 136/84  04/02/24 135/86   Stable, pt to continue medical treatment losartan  25 qd   Hyperlipidemia Lab Results  Component Value Date   LDLCALC 57 03/28/2024   Stable, pt to continue current zetia  10 mg qd   Vitamin  D deficiency Last vitamin D  Lab Results  Component Value Date   VD25OH 29.46 (L) 11/15/2023   Low, to start  oral replacement   Contusion of left middle finger without damage to nail Unusual, atraumatic per pt - for INR and plt today with labs  Followup: Return in about 6 months (around 12/15/2024).  Lynwood Rush, MD 06/22/2024 1:41 PM [facility]  Primary Care - [facility]

## 2024-06-17 NOTE — Patient Instructions (Signed)
 You had the flu shot today  Please continue all other medications as before, including the aspirin  81 mg per day  Please have the pharmacy call with any other refills you may need.  Please continue your efforts at being more active, low cholesterol diet, and weight control.  Please keep your appointments with your specialists as you may have planned - cardiology, and renal  Please go to the LAB at the blood drawing area for the tests to be done  You will be contacted by phone if any changes need to be made immediately.  Otherwise, you will receive a letter about your results with an explanation, but please check with MyChart first.  Please make an Appointment to return in 6 months, or sooner if needed

## 2024-06-18 ENCOUNTER — Other Ambulatory Visit (HOSPITAL_COMMUNITY): Payer: Self-pay

## 2024-06-20 ENCOUNTER — Other Ambulatory Visit (HOSPITAL_COMMUNITY): Payer: Self-pay

## 2024-06-21 ENCOUNTER — Other Ambulatory Visit (HOSPITAL_COMMUNITY): Payer: Self-pay

## 2024-06-22 ENCOUNTER — Encounter: Payer: Self-pay | Admitting: Internal Medicine

## 2024-06-22 DIAGNOSIS — S60032A Contusion of left middle finger without damage to nail, initial encounter: Secondary | ICD-10-CM | POA: Insufficient documentation

## 2024-06-22 NOTE — Assessment & Plan Note (Signed)
 Unusual, atraumatic per pt - for INR and plt today with labs

## 2024-06-22 NOTE — Assessment & Plan Note (Signed)
 Lab Results  Component Value Date   VITAMINB12 330 12/25/2023   Stable, cont oral replacement - b12 1000 mcg qd

## 2024-06-22 NOTE — Assessment & Plan Note (Signed)
 Lab Results  Component Value Date   LDLCALC 57 03/28/2024   Stable, pt to continue current zetia  10 mg qd

## 2024-06-22 NOTE — Assessment & Plan Note (Signed)
 Lab Results  Component Value Date   HGBA1C 5.2 03/28/2024   Stable, pt to continue current medical treatment farxiga  5 every day, mounjaro  10 weekly

## 2024-06-22 NOTE — Assessment & Plan Note (Signed)
 Last vitamin D  Lab Results  Component Value Date   VD25OH 29.46 (L) 11/15/2023   Low, to start oral replacement

## 2024-06-22 NOTE — Assessment & Plan Note (Signed)
 Lab Results  Component Value Date   CREATININE 1.16 06/17/2024   Stable overall, cont to avoid nephrotoxins

## 2024-06-22 NOTE — Assessment & Plan Note (Signed)
 BP Readings from Last 3 Encounters:  06/17/24 130/86  05/17/24 136/84  04/02/24 135/86   Stable, pt to continue medical treatment losartan  25 qd

## 2024-06-24 ENCOUNTER — Other Ambulatory Visit (HOSPITAL_COMMUNITY): Payer: Self-pay

## 2024-06-25 ENCOUNTER — Other Ambulatory Visit (HOSPITAL_COMMUNITY): Payer: Self-pay

## 2024-06-25 MED ORDER — GABAPENTIN 100 MG PO CAPS
100.0000 mg | ORAL_CAPSULE | Freq: Two times a day (BID) | ORAL | 1 refills | Status: AC
  Filled 2024-06-25: qty 60, 30d supply, fill #0
  Filled 2024-08-28: qty 60, 30d supply, fill #1

## 2024-06-25 MED ORDER — DOXYCYCLINE HYCLATE 100 MG PO CAPS
100.0000 mg | ORAL_CAPSULE | Freq: Two times a day (BID) | ORAL | 0 refills | Status: DC
  Filled 2024-06-25: qty 28, 14d supply, fill #0

## 2024-06-25 MED ORDER — METRONIDAZOLE 0.75 % EX CREA
1.0000 | TOPICAL_CREAM | Freq: Two times a day (BID) | CUTANEOUS | 5 refills | Status: DC
  Filled 2024-06-25: qty 45, 23d supply, fill #0

## 2024-06-29 ENCOUNTER — Other Ambulatory Visit (HOSPITAL_COMMUNITY): Payer: Self-pay

## 2024-07-01 ENCOUNTER — Other Ambulatory Visit: Payer: Self-pay

## 2024-07-03 ENCOUNTER — Other Ambulatory Visit (HOSPITAL_COMMUNITY): Payer: Self-pay

## 2024-07-04 ENCOUNTER — Other Ambulatory Visit (HOSPITAL_COMMUNITY): Payer: Self-pay

## 2024-07-08 ENCOUNTER — Encounter (HOSPITAL_BASED_OUTPATIENT_CLINIC_OR_DEPARTMENT_OTHER): Payer: Self-pay | Admitting: Family

## 2024-07-08 ENCOUNTER — Ambulatory Visit (HOSPITAL_BASED_OUTPATIENT_CLINIC_OR_DEPARTMENT_OTHER): Admitting: Family

## 2024-07-08 ENCOUNTER — Ambulatory Visit

## 2024-07-08 VITALS — BP 132/85 | HR 82 | Ht 64.0 in | Wt 200.9 lb

## 2024-07-08 DIAGNOSIS — E782 Mixed hyperlipidemia: Secondary | ICD-10-CM

## 2024-07-08 DIAGNOSIS — I10 Essential (primary) hypertension: Secondary | ICD-10-CM

## 2024-07-08 DIAGNOSIS — R0789 Other chest pain: Secondary | ICD-10-CM

## 2024-07-08 DIAGNOSIS — I480 Paroxysmal atrial fibrillation: Secondary | ICD-10-CM

## 2024-07-08 DIAGNOSIS — Z01818 Encounter for other preprocedural examination: Secondary | ICD-10-CM

## 2024-07-08 MED ORDER — DAPAGLIFLOZIN PROPANEDIOL 10 MG PO TABS: ORAL_TABLET | ORAL | 0 refills | Status: DC

## 2024-07-08 NOTE — Patient Instructions (Signed)
 Medication Instructions:   Your physician recommends that you continue on your current medications as directed. Please refer to the Current Medication list given to you today.  *If you need a refill on your cardiac medications before your next appointment, please call your pharmacy*    Follow-Up:  AS PLANNED

## 2024-07-08 NOTE — Progress Notes (Signed)
 Cardiology Office Note   Date:  07/08/2024  ID:  Suzanne Hansen, Suzanne Hansen [birth year], MRN [id number] PCP: Norleen Lynwood ORN, MD  [facility] Providers Cardiologist:  Annabella Scarce, MD Cardiology APP:  Vannie Reche RAMAN, NP     History of Present Illness Suzanne Hansen is a 68 y.o. 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   Prior myoview  09/2013 LVEF 48%, no ischemia. PCP changed hydrochlorothiazide  to Lasix . Established 2016 with diagnosis of heart failure. Carvedilol  and lisinopril  started. Lisinopril  changed to losartan  due to cough. CPX in 2018 with [place] moderate cardiac limitation, deconditioning. Echo 01/2017 LVEF 50-55%. Echo 05/2018 LVEF 55-60%, gr1dd. Atrial fib with initial heart failure symptoms per prior notes. No recurrence, not anticoagulated. Was previously symptomatic while in atrial fib. Echo 2022 LVEF 60-65%. Echo 05/2022 LVEF 54%, gr1dd. 11/2021 cardiac CTA calcium  0.   Had difficulty tolerating statin with myalgia. Transitioned to Zetia .   Last seen 11/24/23. She noted last episode of AF several months ago lasting <30 minutes. Kardia Mobile encouraged.   Presents today for preoperative clearance for right total knee arthroplasty. Notes she is no longer planning on surgery as unfortunately lost job of 20 years and will lose insurance. She is in process of applying for Medicare. She reports two episodes of chest pressure while in the shower, laying down that did not last long. She started to take some deep breaths which did help. Reports no exertional dyspnea, palpitations, edema. Single dose of Torsemide  last week in stetting of girls trip with more time spent sitting and more sodium when eating out. Rare episode of palpitations which are fleeting, plans to purchase [facility] for monitoring.   ROS: Please see the history of present illness.     All other systems reviewed and are negative.   Studies Reviewed EKG Interpretation Date/Time:  Monday July 08 2024 14:02:18 EDT Ventricular Rate:  82 PR Interval:  128 QRS Duration:  88 QT Interval:  372 QTC Calculation: 434 R Axis:   -22  Text Interpretation: Normal sinus rhythm Moderate voltage criteria for LVH, may be normal variant ( R in aVL , Cornell product )  Flat T wave likely related to body habitus Confirmed by Vannie Reche ([id number]) on 07/08/2024 2:44:08 PM    Cardiac Studies & Procedures   ______________________________________________________________________________________________ CARDIAC CATHETERIZATION  CARDIAC CATHETERIZATION 05/02/2018     ECHOCARDIOGRAM  ECHOCARDIOGRAM COMPLETE 06/28/2022  Narrative ECHOCARDIOGRAM REPORT    Patient Name:   Suzanne Hansen Date of Exam: 06/28/2022 Medical Rec #:  [id number]      Height:       64.0 in Accession #:    [id number]     Weight:       238.4 lb Date of Birth:  May 16, 1956      BSA:          2.108 m Patient Age:    66 years       BP:           100/60 mmHg Patient Gender: F              HR:           94 bpm. Exam Location:  Outpatient  Procedure: 2D Echo, 3D Echo, Cardiac Doppler, Color Doppler and Strain Analysis  Indications:    Paroxysmal atrial fibrillation  History:        Patient has prior history of Echocardiogram  examinations, most recent 09/08/2015. Arrythmias:Atrial Fibrillation; Risk Factors:Hypertension and Former Smoker. Chronic systolic and diastolic heart failure (LVEF recovered),.  Sonographer:    Orvil Holmes RDCS Referring Phys: [id number] TIFFANY [place]  IMPRESSIONS   1. Left ventricular ejection fraction by 3D volume is 54 %. The left ventricle has normal function. The left ventricle has no regional wall motion abnormalities. There is mild asymmetric left ventricular hypertrophy of the infero-lateral segment. Left ventricular diastolic parameters are consistent with Grade I  diastolic dysfunction (impaired relaxation). 2. Right ventricular systolic function is normal. The right ventricular size is normal. There is normal pulmonary artery systolic pressure. The estimated right ventricular systolic pressure is 30.7 mmHg. 3. The mitral valve is normal in structure. Trivial mitral valve regurgitation. No evidence of mitral stenosis. 4. The aortic valve is tricuspid. Aortic valve regurgitation is not visualized. No aortic stenosis is present. 5. The inferior vena cava is normal in size with greater than 50% respiratory variability, suggesting right atrial pressure of 3 mmHg.  FINDINGS Left Ventricle: Left ventricular ejection fraction by 3D volume is 54 %. The left ventricle has normal function. The left ventricle has no regional wall motion abnormalities. The left ventricular internal cavity size was normal in size. There is mild asymmetric left ventricular hypertrophy of the infero-lateral segment. Left ventricular diastolic parameters are consistent with Grade I diastolic dysfunction (impaired relaxation).  Right Ventricle: The right ventricular size is normal. No increase in right ventricular wall thickness. Right ventricular systolic function is normal. There is normal pulmonary artery systolic pressure. The tricuspid regurgitant velocity is 2.63 m/s, and with an assumed right atrial pressure of 3 mmHg, the estimated right ventricular systolic pressure is 30.7 mmHg.  Left Atrium: Left atrial size was normal in size.  Right Atrium: Right atrial size was normal in size.  Pericardium: There is no evidence of pericardial effusion.  Mitral Valve: The mitral valve is normal in structure. Trivial mitral valve regurgitation. No evidence of mitral valve stenosis.  Tricuspid Valve: The tricuspid valve is normal in structure. Tricuspid valve regurgitation is mild . No evidence of tricuspid stenosis.  Aortic Valve: The aortic valve is tricuspid. Aortic valve regurgitation is  not visualized. No aortic stenosis is present.  Pulmonic Valve: The pulmonic valve was normal in structure. Pulmonic valve regurgitation is trivial. No evidence of pulmonic stenosis.  Aorta: The aortic root is normal in size and structure.  Venous: The inferior vena cava is normal in size with greater than 50% respiratory variability, suggesting right atrial pressure of 3 mmHg.  IAS/Shunts: No atrial level shunt detected by color flow Doppler.   LEFT VENTRICLE PLAX 2D LVIDd:         4.80 cm         Diastology LVIDs:         3.46 cm         LV e' medial:    6.31 cm/s LV PW:         1.10 cm         LV E/e' medial:  10.8 LV IVS:        0.88 cm         LV e' lateral:   6.53 cm/s LVOT diam:     1.90 cm         LV E/e' lateral: 10.4 LV SV:         35 LV SV Index:   17 LVOT Area:     2.84 cm  3D Volume EF LV 3D EF:    Left ventricul ar ejection fraction by 3D volume is 54 %.  3D Volume EF: 3D EF:        54 % LV EDV:       98 ml LV ESV:       45 ml LV SV:        53 ml  RIGHT VENTRICLE RV Basal diam:  2.96 cm RV Mid diam:    2.50 cm RV S prime:     11.40 cm/s TAPSE (M-mode): 2.6 cm  LEFT ATRIUM             Index        RIGHT ATRIUM          Index LA diam:        3.70 cm 1.76 cm/m   RA Area:     9.75 cm LA Vol (A2C):   26.6 ml 12.62 ml/m  RA Volume:   18.30 ml 8.68 ml/m LA Vol (A4C):   33.1 ml 15.70 ml/m LA Biplane Vol: 30.2 ml 14.33 ml/m AORTIC VALVE LVOT Vmax:   67.60 cm/s LVOT Vmean:  44.500 cm/s LVOT VTI:    0.125 m  AORTA Ao Root diam: 3.00 cm Ao Asc diam:  3.30 cm  MITRAL VALVE               TRICUSPID VALVE MV Area (PHT): 3.77 cm    TR Peak grad:   27.7 mmHg MV Decel Time: 201 msec    TR Vmax:        263.00 cm/s MV E velocity: 68.10 cm/s MV A velocity: 85.40 cm/s  SHUNTS MV E/A ratio:  0.80        Systemic VTI:  0.12 m Systemic Diam: 1.90 cm  Soyla Merck MD Electronically signed by Soyla Merck MD Signature Date/Time:  07/01/2022/11:14:17 AM    Final      CT SCANS  CT CORONARY MORPH W/CTA COR W/SCORE 11/26/2021  Addendum 11/26/2021  4:55 PM ADDENDUM REPORT: 11/26/2021 16:52  EXAM: OVER-READ INTERPRETATION  CT CHEST  The following report is an over-read performed by radiologist Dr. Camellia Lang [facility] on 11/26/2021. This over-read does not include interpretation of cardiac or coronary anatomy or pathology. The coronary calcium  score/coronary CTA interpretation by the cardiologist is attached.  COMPARISON:  None.  FINDINGS: The visualized portions of the lower lung fields show no suspicious nodules, masses, or infiltrates. No pleural fluid seen.  The visualized portions of the mediastinum and chest wall are unremarkable.  IMPRESSION: No significant non-cardiovascular abnormality seen in visualized portion of the thorax.   Electronically Signed By: Camellia Candle M.D. On: 11/26/2021 16:52  Narrative CLINICAL DATA:  Chest pain  EXAM: Cardiac/Coronary CTA  TECHNIQUE: A non-contrast, gated CT scan was obtained with axial slices of 3 mm through the heart for calcium  scoring. Calcium  scoring was performed using the Agatston method. A 130 kV prospective, gated, contrast cardiac scan was obtained. Gantry rotation speed was 250 msecs and collimation was 0.6 mm. Two sublingual nitroglycerin  tablets (0.8 mg) were given. The 3D data set was reconstructed in 5% intervals of the 35-75% of the R-R cycle. Diastolic phases were analyzed on a dedicated workstation using MPR, MIP, and VRT modes. The patient received 95 cc of contrast.  FINDINGS: Image quality: Average. Limited evaluation of the distal vessels due to significant stair step artifact/patient movement.  Noise artifact is: Limited.  Coronary Arteries:  Normal coronary origin.  Right  dominance.  Left main: The left main is a large caliber vessel with a normal take off from the left coronary cusp that  bifurcates to form a left anterior descending artery and a left circumflex artery. There is no plaque or stenosis.  Left anterior descending artery: The proximal and mid segments are patent. The distal segment is not evaluable. The LAD gives off 2 patent diagonal branches.  Left circumflex artery: The LCX is non-dominant. The proximal segment is patent. The mid and distal segments are not evaluable.  Right coronary artery: The RCA is dominant with normal take off from the right coronary cusp. The proximal and mid segments are patent. The distal segments are not evaluable.  Right Atrium: Right atrial size is within normal limits.  Right Ventricle: The right ventricular cavity is within normal limits.  Left Atrium: Left atrial size is normal in size with no left atrial appendage filling defect.  Left Ventricle: The ventricular cavity size is within normal limits. There are no stigmata of prior infarction. There is no abnormal filling defect.  Pulmonary arteries: Normal in size without proximal filling defect.  Pulmonary veins: Normal pulmonary venous drainage.  Pericardium: Normal thickness with no significant effusion or calcium  present.  Cardiac valves: The aortic valve is trileaflet without significant calcification. The mitral valve is normal structure without significant calcification.  Aorta: Normal caliber with no significant disease.  Extra-cardiac findings: See attached radiology report for non-cardiac structures.  IMPRESSION: 1. Coronary calcium  score of 0.  2. Normal coronary origin with right dominance.  3. Normal coronary arteries in the evaluable proximal and mid segments detailed above. Significant stairstep artifact precludes the evaluation of the distal segments despite attempts at reconstruction. Cannot exclude patient movement.  Darryle Decent, MD  Electronically Signed: By: Darryle Decent M.D. On: 11/26/2021 16:03      ______________________________________________________________________________________________      Risk Assessment/Calculations           Physical Exam VS:  BP 132/85 (BP Location: Left Arm, Patient Position: Sitting, Cuff Size: Large)   Pulse 82   Ht 5' 4 (1.626 m)   Wt 200 lb 14.4 oz (91.1 kg)   SpO2 99%   BMI 34.48 kg/m        Wt Readings from Last 3 Encounters:  07/08/24 200 lb 14.4 oz (91.1 kg)  06/17/24 202 lb 9.6 oz (91.9 kg)  05/17/24 200 lb 4 oz (90.8 kg)    GEN: Well nourished, well developed in no acute distress NECK: No JVD; No carotid bruits CARDIAC: RRR, no murmurs, rubs, gallops RESPIRATORY:  Clear to auscultation without rales, wheezing or rhonchi  ABDOMEN: Soft, non-tender, non-distended EXTREMITIES:  No edema; No deformity   ASSESSMENT AND PLAN  Preop - no longer planning on knee surgery due to insurance change, will readdress at follow up.   Chest pain - 2023 CCTA with calcium  score 0. Present chest pain atypical for angina. EKG today no acute ST/T wave changes. No indication for ischemic evaluation. Reassurance provided.  Medication management - changing to Medicare. Provided information for SHIIP. Given samples of Farxiga  to help cover any gap in insurance. She will contact us  if she has issues enrolling in Medicare.   HFrecEF - Euvolemic and well compensated on exam. GDMT Farxiga  5mg  daily, Losartan  12.5mg  daily, PRN Torsemide . Samples of Farxiga  provided. Low sodium diet, fluid restriction <2L, and daily weights encouraged. Educated to contact our office for weight gain of 2 lbs overnight or 5 lbs in one week. Recommend  aiming for 150 minutes of moderate intensity activity per week and following a heart healthy diet.    HLD - Prior statin intolerance. 03/28/24 LDL 57. Continue Zetia  10mg  daily.   HTN - BP reasonably controlled. Continue Losartan  12.5mg  daily. Discussed to monitor BP at home at least 2 hours after medications and sitting for 5-10  minutes.   PAF - prior history. Not maintained on OAC as rare and fleeting occurrence. Encouraged to purchase [facility] for home monitoring        Dispo: follow up 11/2024 with Dr. Raford   Signed, Reche GORMAN Finder, NP

## 2024-07-23 ENCOUNTER — Ambulatory Visit (HOSPITAL_COMMUNITY): Admit: 2024-07-23 | Admitting: Orthopedic Surgery

## 2024-07-23 SURGERY — ARTHROPLASTY, KNEE, TOTAL
Anesthesia: Spinal | Site: Knee | Laterality: Right

## 2024-08-02 ENCOUNTER — Other Ambulatory Visit (HOSPITAL_COMMUNITY): Payer: Self-pay

## 2024-08-08 ENCOUNTER — Other Ambulatory Visit (HOSPITAL_COMMUNITY): Payer: Self-pay

## 2024-08-09 ENCOUNTER — Other Ambulatory Visit (HOSPITAL_COMMUNITY): Payer: Self-pay

## 2024-08-12 ENCOUNTER — Other Ambulatory Visit (HOSPITAL_COMMUNITY): Payer: Self-pay

## 2024-08-14 ENCOUNTER — Other Ambulatory Visit (HOSPITAL_COMMUNITY): Payer: Self-pay

## 2024-08-15 ENCOUNTER — Telehealth: Payer: Self-pay

## 2024-08-15 ENCOUNTER — Other Ambulatory Visit (HOSPITAL_COMMUNITY): Payer: Self-pay

## 2024-08-15 NOTE — Telephone Encounter (Signed)
 Copied from CRM [id number]. Topic: Clinical - Medication Prior Auth >> Aug 15, 2024 10:44 AM Alfonso HERO wrote: Reason for CRM: [facility] called requesting Prior Auth for tirzepatide  (MOUNJARO ) 10 MG/0.5ML Pen be sent through the portal.

## 2024-08-15 NOTE — Telephone Encounter (Signed)
 Pharmacy Patient Advocate Encounter   Received notification from Pt Calls Messages that prior authorization for Mounjaro  10mg /0.22ml is required/requested.   Insurance verification completed.   The patient is insured through [employer].   Per test claim: PA required; PA submitted to above mentioned insurance via Latent Key/confirmation #/EOC [id number] Status is pending

## 2024-08-16 ENCOUNTER — Other Ambulatory Visit (HOSPITAL_COMMUNITY): Payer: Self-pay

## 2024-08-16 NOTE — Telephone Encounter (Signed)
 Pharmacy Patient Advocate Encounter  Received notification from [employer] that Prior Authorization for Mounjaro  10mg /0.79ml has been APPROVED from 10/11/23 to 10/09/25. Ran test claim, Copay is $391.00 for 84 day supply. This test claim was processed through [facility]- copay amounts may vary at other pharmacies due to pharmacy/plan contracts, or as the patient moves through the different stages of their insurance plan.   PA #/Case ID/Reference #: [id number]  The copay of $391 is including a deductible.   [facility]oke with [facility] to notify of the approval.

## 2024-08-19 ENCOUNTER — Other Ambulatory Visit (HOSPITAL_COMMUNITY): Payer: Self-pay

## 2024-08-22 ENCOUNTER — Other Ambulatory Visit: Payer: Self-pay

## 2024-08-22 ENCOUNTER — Other Ambulatory Visit (HOSPITAL_COMMUNITY): Payer: Self-pay

## 2024-08-22 DIAGNOSIS — Z6834 Body mass index (BMI) 34.0-34.9, adult: Secondary | ICD-10-CM | POA: Diagnosis not present

## 2024-08-22 DIAGNOSIS — N941 Unspecified dyspareunia: Secondary | ICD-10-CM | POA: Diagnosis not present

## 2024-08-22 DIAGNOSIS — Z1151 Encounter for screening for human papillomavirus (HPV): Secondary | ICD-10-CM | POA: Diagnosis not present

## 2024-08-22 DIAGNOSIS — Z124 Encounter for screening for malignant neoplasm of cervix: Secondary | ICD-10-CM | POA: Diagnosis not present

## 2024-08-22 DIAGNOSIS — R232 Flushing: Secondary | ICD-10-CM | POA: Diagnosis not present

## 2024-08-22 DIAGNOSIS — R350 Frequency of micturition: Secondary | ICD-10-CM | POA: Diagnosis not present

## 2024-08-22 DIAGNOSIS — R87610 Atypical squamous cells of undetermined significance on cytologic smear of cervix (ASC-US): Secondary | ICD-10-CM | POA: Diagnosis not present

## 2024-08-22 MED ORDER — MIRABEGRON ER 25 MG PO TB24
25.0000 mg | ORAL_TABLET | Freq: Every day | ORAL | 2 refills | Status: DC
  Filled 2024-08-22: qty 30, 30d supply, fill #0

## 2024-08-23 ENCOUNTER — Ambulatory Visit (INDEPENDENT_AMBULATORY_CARE_PROVIDER_SITE_OTHER): Admitting: Internal Medicine

## 2024-08-23 ENCOUNTER — Encounter: Payer: Self-pay | Admitting: Internal Medicine

## 2024-08-23 ENCOUNTER — Other Ambulatory Visit (HOSPITAL_COMMUNITY): Payer: Self-pay

## 2024-08-23 VITALS — BP 122/84 | HR 69 | Temp 98.0°F | Ht 64.0 in | Wt 194.0 lb

## 2024-08-23 DIAGNOSIS — Z7985 Long-term (current) use of injectable non-insulin antidiabetic drugs: Secondary | ICD-10-CM

## 2024-08-23 DIAGNOSIS — E7800 Pure hypercholesterolemia, unspecified: Secondary | ICD-10-CM

## 2024-08-23 DIAGNOSIS — I10 Essential (primary) hypertension: Secondary | ICD-10-CM

## 2024-08-23 DIAGNOSIS — Z7984 Long term (current) use of oral hypoglycemic drugs: Secondary | ICD-10-CM

## 2024-08-23 DIAGNOSIS — E559 Vitamin D deficiency, unspecified: Secondary | ICD-10-CM | POA: Diagnosis not present

## 2024-08-23 DIAGNOSIS — E1122 Type 2 diabetes mellitus with diabetic chronic kidney disease: Secondary | ICD-10-CM

## 2024-08-23 DIAGNOSIS — M4000 Postural kyphosis, site unspecified: Secondary | ICD-10-CM | POA: Insufficient documentation

## 2024-08-23 DIAGNOSIS — N1831 Chronic kidney disease, stage 3a: Secondary | ICD-10-CM | POA: Diagnosis not present

## 2024-08-23 DIAGNOSIS — E538 Deficiency of other specified B group vitamins: Secondary | ICD-10-CM

## 2024-08-23 LAB — BASIC METABOLIC PANEL WITH GFR
BUN: 19 mg/dL (ref 6–23)
CO2: 28 meq/L (ref 19–32)
Calcium: 9.7 mg/dL (ref 8.4–10.5)
Chloride: 104 meq/L (ref 96–112)
Creatinine, Ser: 1.08 mg/dL (ref 0.40–1.20)
GFR: 53.2 mL/min — ABNORMAL LOW (ref >60.00)
Glucose, Bld: 75 mg/dL (ref 70–99)
Potassium: 4 meq/L (ref 3.5–5.1)
Sodium: 139 meq/L (ref 135–145)

## 2024-08-23 LAB — LIPID PANEL
Cholesterol: 149 mg/dL (ref 0–200)
HDL: 73.1 mg/dL (ref >39.00)
LDL Cholesterol: 65 mg/dL (ref 0–99)
NonHDL: 75.64
Total CHOL/HDL Ratio: 2
Triglycerides: 51 mg/dL (ref 0.0–149.0)
VLDL: 10.2 mg/dL (ref 0.0–40.0)

## 2024-08-23 LAB — MICROALBUMIN / CREATININE URINE RATIO
Creatinine,U: 118 mg/dL
Microalb Creat Ratio: UNDETERMINED mg/g (ref 0.0–30.0)
Microalb, Ur: <0.7 mg/dL

## 2024-08-23 LAB — HEPATIC FUNCTION PANEL
ALT: 13 U/L (ref 0–35)
AST: 16 U/L (ref 0–37)
Albumin: 4.4 g/dL (ref 3.5–5.2)
Alkaline Phosphatase: 69 U/L (ref 39–117)
Bilirubin, Direct: 0.1 mg/dL (ref 0.0–0.3)
Total Bilirubin: 0.9 mg/dL (ref 0.2–1.2)
Total Protein: 6.8 g/dL (ref 6.0–8.3)

## 2024-08-23 MED ORDER — VEOZAH 45 MG PO TABS
ORAL_TABLET | ORAL | 0 refills | Status: DC
  Filled 2024-08-23: qty 30, 30d supply, fill #0

## 2024-08-23 MED ORDER — TIRZEPATIDE 12.5 MG/0.5ML ~~LOC~~ SOAJ
12.5000 mg | SUBCUTANEOUS | 3 refills | Status: AC
  Filled 2024-08-23: qty 6, 84d supply, fill #0

## 2024-08-23 NOTE — Assessment & Plan Note (Addendum)
 [id number]  Lab Results  Component Value Date   HGBA1C 5.2 03/28/2024   Stable, pt to continue current medical treatment farxiga  5 mg but increase mounjaro  12.5 mg weekly  Lab Results  Component Value Date   CREATININE 1.16 06/17/2024   Stable overall, cont to avoid nephrotoxins

## 2024-08-23 NOTE — Assessment & Plan Note (Signed)
 Lab Results  Component Value Date   LDLCALC 57 03/28/2024   Stable, pt to continue zetia  10 mg every day, declines statin

## 2024-08-23 NOTE — Progress Notes (Signed)
 Patient ID: Suzanne Hansen, female   DOB: 09-Feb-1956, 68 y.o.   MRN: [id number]        Chief Complaint: follow up Dm, hld, low vit d, htn       HPI:  Suzanne Hansen is a 68 y.o. female here overall doing ok, Pt denies chest pain, increased sob or doe, wheezing, orthopnea, PND, increased LE swelling, palpitations, dizziness or syncope.   Pt denies polydipsia, polyuria, or new focal neuro s/s.    Pt denies fever,, night sweats, loss of appetite, or other constitutional symptoms      Peak wt has been 260 lbs now down to 194 with mounjaro   but wt has plateued.  Has pulm appt next, with eye exam soon, and neurology for osa soon, also GI dec 16.  Saw Gyn for annual with normal BP.   Wt Readings from Last 3 Encounters:  08/23/24 194 lb (88 kg)  07/08/24 200 lb 14.4 oz (91.1 kg)  06/17/24 202 lb 9.6 oz (91.9 kg)   BP Readings from Last 3 Encounters:  08/23/24 122/84  07/08/24 132/85  06/17/24 130/86         Past Medical History:  Diagnosis Date   ALLERGIC RHINITIS 05/09/2008   Allergy    ANXIETY 05/23/2007   ARM PAIN, LEFT 10/23/2009   Arthritis    ASTHMA 05/23/2007   Back pain    Cardiomyopathy (HCC) 09/08/2015   Cataract    Chest pain    CHF (congestive heart failure) (HCC)    Chronic diastolic heart failure (HCC) 01/22/2020   Constipation    CONTACT DERMATITIS 06/09/2009   Diabetes mellitus without complication (HCC)    type II   Dyspnea    allergies-dust, cig smoke, rag weeds   ENDOMETRIOSIS NOS 05/23/2007   FACIAL PAIN 06/02/2010   Food allergy    FREQUENCY, URINARY 05/17/2010   GERD (gastroesophageal reflux disease)    GLUCOSE INTOLERANCE 08/28/2009   HERPES SIMPLEX, UNCOMPLICATED 05/23/2007   Hyperlipidemia 08/28/2015   HYPERTHYROIDISM 05/23/2007   Hypoactive thyroid     HYPOTHYROIDISM 05/09/2008   Impaired glucose tolerance 01/28/2011   INSOMNIA, HX OF 05/23/2007   Joint pain    LATERAL EPICONDYLITIS, RIGHT 03/10/2009   LIPOMA 10/22/2010   NECK PAIN 11/10/2010    OBESITY 05/23/2007   Osteopenia 09/28/2018   Plantar fasciitis    Both feet   Pre-diabetes    SHOULDER PAIN, LEFT 05/17/2010   SINUSITIS- ACUTE-NOS 11/03/2008   SKIN LESION 11/10/2010   Sleep apnea    Stage III chronic kidney disease (HCC)    Stage III   Unspecified Chest Pain 07/25/2008   UNSPECIFIED URTICARIA 06/04/2009   URI 08/28/2009   UTI 04/29/2008   VITAMIN D  DEFICIENCY 08/28/2009   Wheezing 07/12/2010   Past Surgical History:  Procedure Laterality Date   BIOPSY OF SKIN SUBCUTANEOUS TISSUE AND/OR MUCOUS MEMBRANE  03/26/2024   Procedure: BIOPSY, SKIN, SUBCUTANEOUS TISSUE, OR MUCOUS MEMBRANE;  Surgeon: Shila Gustav GAILS, MD;  Location: [facility];  Service: Gastroenterology;;   COLONOSCOPY     COLONOSCOPY N/A 03/26/2024   Procedure: COLONOSCOPY;  Surgeon: Shila Gustav GAILS, MD;  Location: [facility];  Service: Gastroenterology;  Laterality: N/A;   COLONOSCOPY WITH PROPOFOL  N/A 04/25/2022   Procedure: COLONOSCOPY WITH PROPOFOL ;  Surgeon: Shila Gustav GAILS, MD;  Location: [facility];  Service: Gastroenterology;  Laterality: N/A;   ENDOMETRIAL ABLATION     ESOPHAGOGASTRODUODENOSCOPY N/A 03/26/2024   Procedure: EGD (ESOPHAGOGASTRODUODENOSCOPY);  Surgeon: Nandigam, Kavitha V, MD;  Location: [facility];  Service: Gastroenterology;  Laterality: N/A;   LAMINECTOMY  1996   LAPAROTOMY     exploratory   lower back surgery  07/06/2017   cyst   POLYPECTOMY     POLYPECTOMY  04/25/2022   Procedure: POLYPECTOMY;  Surgeon: Shila Gustav GAILS, MD;  Location: [facility];  Service: Gastroenterology;;   POLYPECTOMY  03/26/2024   Procedure: POLYPECTOMY, INTESTINE;  Surgeon: Shila Gustav GAILS, MD;  Location: [facility];  Service: Gastroenterology;;   s/p endometrial ablation  12/06   TUBAL LIGATION     UTERINE FIBROID SURGERY     WISDOM TOOTH EXTRACTION      reports that she quit smoking about 36 years ago. Her smoking use included cigarettes. She started smoking about 48  years ago. She has a 6 pack-year smoking history. She has never used smokeless tobacco. She reports current alcohol use of about 1.0 standard drink of alcohol per week. She reports that she does not use drugs. family history includes COPD in her father; Cancer in her maternal grandmother; Depression in her mother; Diabetes in her cousin and sister; Heart disease in her cousin and mother; Hypertension in her cousin and father; Kidney disease in her sister. Allergies  Allergen Reactions   Shellfish Allergy Anaphylaxis   Clarithromycin Nausea And Vomiting   Latex Itching and Other (See Comments)    ITCHING AND COLD SORES AROUND MOUTH WHEN LATEX GLOVES USED BY THE DENTIST/HYGENTIST   Metronidazole  Nausea And Vomiting   Zostavax [Zoster Vaccine Live]     Arm swelled   Adhesive [Tape] Dermatitis    Plastic tape    Amiloride     foot numbness   Clindamycin Nausea Only and Nausea And Vomiting   Gadobenate     Pt has had some form of dye without issues but isnt sure if she is allergic to this specific agent    Lisinopril  Cough   Naproxen Sodium Other (See Comments)   Current Outpatient Medications on File Prior to Visit  Medication Sig Dispense Refill   acetaminophen  (TYLENOL ) 650 MG CR tablet Take by mouth.     Albuterol -Budesonide  (AIRSUPRA ) 90-80 MCG/ACT AERO Inhale 2 puffs into the lungs every 4 (four) hours as needed. 10.7 g 3   allopurinol  (ZYLOPRIM ) 100 MG tablet Take 2 tablets (200 mg total) by mouth daily. 180 tablet 3   aspirin  81 MG EC tablet Take 81 mg by mouth daily.     Cholecalciferol  (VITAMIN D -1000 MAX ST) 25 MCG (1000 UT) tablet Take by mouth.     dapagliflozin  propanediol (FARXIGA ) 5 MG TABS tablet Take 1 tablet (5 mg total) by mouth daily before breakfast. 90 tablet 3   diclofenac  Sodium (PENNSAID ) 2 % SOLN Apply 2 Pumps (40 mg total) topically to affected knee(s) 2 (two) times daily. 112 g 1   estradiol  (ESTRACE ) 0.1 MG/GM vaginal cream Place 1 Applicatorful vaginally once a  week.     estradiol  (ESTRACE ) 0.1 MG/GM vaginal cream Insert 0.5 g into vagina nightly 2-3 times per week. 42.5 g 3   ezetimibe  (ZETIA ) 10 MG tablet Take 1 tablet (10 mg total) by mouth daily. 90 tablet 3   fexofenadine (ALLEGRA) 180 MG tablet Take 180 mg by mouth daily.     Fezolinetant  (VEOZAH ) 45 MG TABS Take 1 tablet (45 mg total) by mouth daily. 30 tablet 3   fluticasone -salmeterol (ADVAIR  DISKUS) 500-50 MCG/ACT AEPB Inhale 1 puff into the lungs in the morning and at bedtime. 180 each 3   gabapentin  (NEURONTIN ) 100 MG  capsule Take 1 capsule (100 mg total) by mouth 2 (two) times daily. 60 capsule 1   iron  polysaccharides (POLY-IRON  150) 150 MG capsule Take 1 capsule (150 mg total) by mouth daily. 90 capsule 1   levothyroxine  (SYNTHROID ) 125 MCG tablet Take 1 tablet (125 mcg total) by mouth daily. 90 tablet 3   losartan  (COZAAR ) 25 MG tablet Take 0.5 tablets (12.5 mg total) by mouth daily. NEED APPOINTMENT 45 tablet 3   magnesium oxide (MAG-OX) 400 MG tablet Take 400 mg by mouth daily.     metroNIDAZOLE  (METROCREAM ) 0.75 % cream Apply topically 2 (two) times daily. 45 g 5   MIEBO  1.338 GM/ML SOLN      mirabegron ER (MYRBETRIQ) 25 MG TB24 tablet Take 1 tablet (25 mg total) by mouth daily. 30 tablet 2   nystatin -triamcinolone  (MYCOLOG II) cream APPLY TO THE AFFECTED AREAS 2 TIMES PER DAY IN THE MORNING AND EVENING 30 g 1   pantoprazole  (PROTONIX ) 40 MG tablet Take 1 tablet (40 mg total) by mouth 2 (two) times daily before a meal. 180 tablet 3   Perfluorohexyloctane  (MIEBO ) 1.338 GM/ML SOLN Instill 1 drop into both eyes four times a day as directed 3 mL 10   potassium chloride  SA (KLOR-CON  M) 20 MEQ tablet Take 1 tablet (20 mEq total) by mouth daily as needed. (Take with torsemide ) 90 tablet 1   torsemide  (DEMADEX ) 20 MG tablet Take 1 tablet (20 mg total) by mouth daily as needed. 90 tablet 1   traMADol  (ULTRAM ) 50 MG tablet Take 1 tablet (50 mg total) by mouth every 6 (six) hours as needed. 30  tablet 0   No current facility-administered medications on file prior to visit.        ROS:  All others reviewed and negative.  Objective        PE:  BP 122/84 (BP Location: Right Arm, Patient Position: Sitting, Cuff Size: Normal)   Pulse 69   Temp 98 F (36.7 C) (Oral)   Ht 5' 4 (1.626 m)   Wt 194 lb (88 kg)   SpO2 98%   BMI 33.30 kg/m                 Constitutional: Pt appears in NAD               HENT: Head: NCAT.                Right Ear: External ear normal.                 Left Ear: External ear normal.                Eyes: . Pupils are equal, round, and reactive to light. Conjunctivae and EOM are normal               Nose: without d/c or deformity               Neck: Neck supple. Gross normal ROM               Cardiovascular: Normal rate and regular rhythm.                 Pulmonary/Chest: Effort normal and breath sounds without rales or wheezing.                Abd:  Soft, NT, ND, + BS, no organomegaly               Neurological: Pt is  alert. At baseline orientation, motor grossly intact               Skin: Skin is warm. No rashes, no other new lesions, LE edema - none               Psychiatric: Pt behavior is normal without agitation   Micro: none  Cardiac tracings I have personally interpreted today:  none  Pertinent Radiological findings (summarize): none   Lab Results  Component Value Date   WBC 7.0 06/17/2024   HGB 14.7 06/17/2024   HCT 43.9 06/17/2024   PLT 248.0 06/17/2024   GLUCOSE 80 06/17/2024   CHOL 152 03/28/2024   TRIG 87.0 03/28/2024   HDL 77.00 03/28/2024   LDLCALC 57 03/28/2024   ALT 11 06/17/2024   AST 12 06/17/2024   NA 141 06/17/2024   K 4.0 06/17/2024   CL 104 06/17/2024   CREATININE 1.16 06/17/2024   BUN 17 06/17/2024   CO2 29 06/17/2024   TSH 0.01 (L) 11/15/2023   INR 1.0 03/21/2017   HGBA1C 5.2 03/28/2024   Assessment/Plan:  Suzanne Hansen is a 68 y.o. Black or African American [2] female with  has a past medical history of  ALLERGIC RHINITIS (05/09/2008), Allergy, ANXIETY (05/23/2007), ARM PAIN, LEFT (10/23/2009), Arthritis, ASTHMA (05/23/2007), Back pain, Cardiomyopathy (HCC) (09/08/2015), Cataract, Chest pain, CHF (congestive heart failure) (HCC), Chronic diastolic heart failure (HCC) (95/85/7978), Constipation, CONTACT DERMATITIS (06/09/2009), Diabetes mellitus without complication (HCC), Dyspnea, ENDOMETRIOSIS NOS (05/23/2007), FACIAL PAIN (06/02/2010), Food allergy, FREQUENCY, URINARY (05/17/2010), GERD (gastroesophageal reflux disease), GLUCOSE INTOLERANCE (08/28/2009), HERPES SIMPLEX, UNCOMPLICATED (05/23/2007), Hyperlipidemia (08/28/2015), HYPERTHYROIDISM (05/23/2007), Hypoactive thyroid , HYPOTHYROIDISM (05/09/2008), Impaired glucose tolerance (01/28/2011), INSOMNIA, HX OF (05/23/2007), Joint pain, LATERAL EPICONDYLITIS, RIGHT (03/10/2009), LIPOMA (10/22/2010), NECK PAIN (11/10/2010), OBESITY (05/23/2007), Osteopenia (09/28/2018), Plantar fasciitis, Pre-diabetes, SHOULDER PAIN, LEFT (05/17/2010), SINUSITIS- ACUTE-NOS (11/03/2008), SKIN LESION (11/10/2010), Sleep apnea, Stage III chronic kidney disease (HCC), Unspecified Chest Pain (07/25/2008), UNSPECIFIED URTICARIA (06/04/2009), URI (08/28/2009), UTI (04/29/2008), VITAMIN D  DEFICIENCY (08/28/2009), and Wheezing (07/12/2010).  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  Lab Results  Component Value Date   HGBA1C 5.2 03/28/2024   Stable, pt to continue current medical treatment farxiga  5 mg but increase mounjaro  12.5 mg weekly  Lab Results  Component Value Date   CREATININE 1.16 06/17/2024   Stable overall, cont to avoid nephrotoxins   Vitamin D  deficiency Last vitamin D  Lab Results  Component Value Date   VD25OH 29.46 (L) 11/15/2023   Low, to start oral replacement   Hyperlipidemia Lab Results  Component Value Date   LDLCALC 57 03/28/2024   Stable, pt to continue zetia  10 mg every day, declines statin   Essential (primary)  hypertension BP Readings from Last 3 Encounters:  08/23/24 122/84  07/08/24 132/85  06/17/24 130/86   Stable, pt to continue medical treatment losartan  25 mg qd   B12 deficiency Lab Results  Component Value Date   VITAMINB12 330 12/25/2023   Stable, cont oral replacement - b12 1000 mcg qd  Followup: Return in about 6 months (around 02/20/2025).  Lynwood Rush, MD 08/23/2024 12:11 PM [facility]

## 2024-08-23 NOTE — Patient Instructions (Addendum)
 Ok to increase the mounjaro  to 12.5 mg weekly  Please continue all other medications as before, and refills have been done if requested.  Please have the pharmacy call with any other refills you may need.  Please continue your efforts at being more active, low cholesterol diet, and weight control.  Please keep your appointments with your specialists as you may have planned - cardiology, GYN, GI , Neurology, Pulmonary and the Eye doctor  Please go to the LAB at the blood drawing area for the tests to be done  You will be contacted by phone if any changes need to be made immediately.  Otherwise, you will receive a letter about your results with an explanation, but please check with MyChart first.  Please make an Appointment to return in 6 months, or sooner if needed

## 2024-08-23 NOTE — Assessment & Plan Note (Signed)
 BP Readings from Last 3 Encounters:  08/23/24 122/84  07/08/24 132/85  06/17/24 130/86   Stable, pt to continue medical treatment losartan  25 mg qd

## 2024-08-23 NOTE — Assessment & Plan Note (Signed)
 Lab Results  Component Value Date   VITAMINB12 330 12/25/2023   Stable, cont oral replacement - b12 1000 mcg qd

## 2024-08-23 NOTE — Assessment & Plan Note (Signed)
 Last vitamin D  Lab Results  Component Value Date   VD25OH 29.46 (L) 11/15/2023   Low, to start oral replacement

## 2024-08-26 ENCOUNTER — Other Ambulatory Visit (HOSPITAL_COMMUNITY): Payer: Self-pay

## 2024-08-26 ENCOUNTER — Ambulatory Visit: Payer: Self-pay | Admitting: Internal Medicine

## 2024-08-26 LAB — HEMOGLOBIN A1C: Hgb A1c MFr Bld: 5 % (ref 4.6–6.5)

## 2024-08-26 NOTE — Progress Notes (Unsigned)
 SABRA

## 2024-08-26 NOTE — Patient Instructions (Incomplete)

## 2024-08-26 NOTE — Progress Notes (Signed)
 The test results show that your current treatment is OK, as the tests are stable.  Please continue the same plan.  There is no other need for change of treatment or further evaluation based on these results, at this time.  thanks

## 2024-08-26 NOTE — Progress Notes (Unsigned)
 PATIENT: Suzanne Hansen DOB: 12-Oct-1955  REASON FOR VISIT: follow up HISTORY FROM: patient  No chief complaint on file.   HISTORY OF PRESENT ILLNESS:  08/27/2024 ALL:  Suzanne Hansen returns for follow up for OSA on CPAP. She was last seen 08/2023 by Dr Chalice and reported more difficulty using CPAP due to hot flashes. She was advised to discuss trial of sertraline with PCP. HST repeated s/p weight loss and showed continued, mild, OSA. Since, she reports doing     05/19/2022 ALL: Suzanne Hansen returns for follow up for OSA on CPAP. She reports doing well on therapy. She admits that she has not been as compliant as in the past. She is using CPAP most nights for about 4 hours. She does note significant improvement in sleep quality if she doesn't use her machine. She wakes tired and with a sore throat. She does mention occasional episodes of coughing when she first starts therapy but once she adjusts her nasal pillow she is fine. Memory is good. She feels that changing jobs has helped reduce stress levels.     05/13/2021 ALL: Suzanne Hansen returns for follow up for OSA on CPAP. She reports doing better on CPAP since reduced pressure settings. She is sleeping fairly well but admits that sleep schedule varies. She likes to read and can stay up til 3am. She naps when she gets home and then can't sleep at night. She feels that memory is the same. She remains employed full time and does well with performance evaluations.     05/13/20 CD: Suzanne Hansen is a 68 y.o. female here today for follow up for OSA on CPAP.  Epworth sleepiness scale: 6 / 24 , FSS at 28/63  Points.   Poor compliance with CPAP- due to insomnia - I am up at 3 AM and sometimes that's the end of the night And  When I read in bed I will always fall asleep- and I forget to put the CPAP on many evenings.   Spine fusion in December has reduced the pain. Dr Onetha. CPAP supplies not longer paid for by insurance. [employer]. She is now  information systems manager. Nasal ;pillow replacement.  05/13/2019 ALL:  Suzanne Hansen is a 68 y.o. female here today for follow up for OSA on CPAP.  She returns today for initial compliance report.  She is doing very well and notes improvement in energy and less fatigue during the day.  Compliance report dated 04/09/2019 through 05/08/2019 reveals that she is using CPAP every day for compliance of 100%.  28 out of the last 30 days she used her machine greater than 4 hours for compliance of 93%.  AHI was 2.8 on 16 cm of water and EPR of 3.  There was no significant leak.  She is doing very well today and without concerns.  HISTORY: (copied from Dr Dohmeier's note on 09/18/2018)  Suzanne Hansen is a 68 y.o. female , she was originally seen in May 2017 seen here as a referral from Dentist DR Darrel, DMD at [facility] and [address].  I have the pleasure of seeing Joyice Magda today in a revisit, Almost 3 years after his encounter at her last.  Today is 18 September 2018 and Suzanne Hansen reports that her cardiologist Dr. Heron Dawn P. Sami, DO at [facility] in [place] wants her to be reevaluated for sleep apnea.  The patient had been evaluated in an attended split-night polysomnography on 29 Feb 2016 at the time she had  a baseline AHI of 17.3 mostly hypercapnia related, REM AHI was 52.6 she did not have oxygen desaturations of significance.  She was titrated to CPAP at 9 cmH2O the AHI became 0.0 at that setting and she tolerated supine positional sleep without further apneas, she also had a significant amount of REM sleep rebounding.   The patient started CPAP late May 2017 at home but she contracted frequent upper respiratory infections, sinusitis and she suffers from a easily congested nose and sinus symptoms.  She felt that CPAP was not comfortable for her to wear and I had never seen her after the trial of CPAP and she decided to go with a dental appliance instead.  Her daughter now tells her that the dental appliance  has not eliminated her snoring, as she could hear her mother snore.  Her heart failure specialist is also concerned that the dental appliance does nothing to treat the underlying REM dependent apnea, which is true.  Her family has noticed that she continues to have apneas while using the mouthpiece. The mouth piece cannot be adjusted ay further.    I have also the opportunity to read her cardiologist notes, Dr Izetta, according to the visit notes Suzanne Hansen is described as a 68 year old female with obesity, with prior mild systolic heart failure but now normal ejection fraction after a right heart catheterization, paroxysmal atrial fibrillation.  Longstanding dyspnea and chest pain she has been on diuretics for several years.  Left ejection fraction had been 40 to 50% since 2014 or longer she is intolerant of lisinopril  to the cough, she is not on anticoagulation for paroxysmal atrial fibrillation.  She is a borderline diabetic she is on statin for high lipids, she uses a mouth appliance for sleep apnea.  She was seen in November 2018 for chest pain had a negative stress test was given Lasix  in the ED and had not taken her diuretics at home that day.  She received a steroid injection for hip pain, she had no improvement after a nerve ablation but finally found some improvement after the cyst was removed from the lumbar spine, she has an appointment with nephrology based on her cardiology referral.  Her white blood cell count had been 11.3 hemoglobin 12.4 hematocrit 35.9, AST 24, AST 20 sodium 139 potassium 3.8 in August 2019, creatinine was 1.75 which is elevated on May 17, 2018.  BUN was high at 29.   Nephrology visit at [facility] was in 3rd October 2019, the patient was started on allopurinol .      Suzanne. Hansen presents with Asthma, recurrent bronchitis, sinusitis,  hypothyroidism and weight gain ( BMI is now 41), and a  diagnosis of cardiomyopathy. Her cardiomyopathy progressed into heart failure, CKD stage 3 ,  and she developed leg edema. She knows she snores, because her throat is sore and her mouth is dry in AM, her daughters tell her she stops breathing and snores loudly.    Sleep habits are as follows: She usually returns home after work feeling almost ready to go to bed. She will be on her comfort close by 7 or 8 PM average bedtime is around 10 PM. She sleeps on one pillow, does not elevate the head of bed. She falls asleep on her side, but she wakes up she is often on her back. She usually falls asleep easily and she feels that she sleeps through the night some nights, others she will just wake up for no reason.  Sometimes she will gasp  for air or seemingly snore herself awake,( mostly when falling asleep in a chair). She will have three  bathroom breaks at night on average, rarely 4-5 . Wakes up spontaneously at about 6.00 AM but she needs to be up by 7.AM.  No naps in daytime- work is busy. She may doze when not physically active or mentally stimulated and she does often feel sleepier when she drives, helping herself to some caffeine on the way.   Sleep and additional medical history :As a child she used to sleep with a night light on, he sometimes eating a big light. She had some nightmares the usual pre-school or fear of monsters. She has no history of sleepwalking so of true night terrors , but of enuresis.  She had a cyst removed from her spine 06-2017, and since her left leg bothers her not as much.   Social history: She is divorced after 25 years of marriage, sleeps alone with her dog.  Adult  daughters, one married, one a CHIEF FINANCIAL OFFICER, and the youngest one finishes her masters in education.  The youngest lives with her.  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 Caffeine - one cup in AM and rarely uses caffeine -  ETOH rare, less than one a week.  Tobacco use- in college  Sept 1977, and stopped with her first pregnancy - 5 cig a day.   REVIEW OF  SYSTEMS: Out of a complete 14 system review of symptoms, the patient complains only of the following symptoms, memory loss, headache and all other reviewed systems are negative.  Epworth sleepiness scale: 9   ALLERGIES: Allergies  Allergen Reactions   Shellfish Allergy Anaphylaxis   Clarithromycin Nausea And Vomiting   Latex Itching and Other (See Comments)    ITCHING AND COLD SORES AROUND MOUTH WHEN LATEX GLOVES USED BY THE DENTIST/HYGENTIST   Metronidazole  Nausea And Vomiting   Zostavax [Zoster Vaccine Live]     Arm swelled   Adhesive [Tape] Dermatitis    Plastic tape    Amiloride     foot numbness   Clindamycin Nausea Only and Nausea And Vomiting   Gadobenate     Pt has had some form of dye without issues but isnt sure if she is allergic to this specific agent    Lisinopril  Cough   Naproxen Sodium Other (See Comments)    HOME MEDICATIONS: Outpatient Medications Prior to Visit  Medication Sig Dispense Refill   acetaminophen  (TYLENOL ) 650 MG CR tablet Take by mouth.     Albuterol -Budesonide  (AIRSUPRA ) 90-80 MCG/ACT AERO Inhale 2 puffs into the lungs every 4 (four) hours as needed. 10.7 g 3   allopurinol  (ZYLOPRIM ) 100 MG tablet Take 2 tablets (200 mg total) by mouth daily. 180 tablet 3   aspirin  81 MG EC tablet Take 81 mg by mouth daily.     Cholecalciferol  (VITAMIN D -1000 MAX ST) 25 MCG (1000 UT) tablet Take by mouth.     dapagliflozin  propanediol (FARXIGA ) 5 MG TABS tablet Take 1 tablet (5 mg total) by mouth daily before breakfast. 90 tablet 3   diclofenac  Sodium (PENNSAID ) 2 % SOLN Apply 2 Pumps (40 mg total) topically to affected knee(s) 2 (two) times daily. 112 g 1   estradiol  (ESTRACE ) 0.1 MG/GM vaginal cream Place 1 Applicatorful vaginally once a week.     estradiol  (ESTRACE ) 0.1 MG/GM vaginal cream Insert 0.5 g into vagina nightly 2-3 times per week. 42.5 g 3   ezetimibe  (  ZETIA ) 10 MG tablet Take 1 tablet (10 mg total) by mouth daily. 90 tablet 3   fexofenadine  (ALLEGRA) 180 MG tablet Take 180 mg by mouth daily.     Fezolinetant  (VEOZAH ) 45 MG TABS Take 1 tablet (45 mg total) by mouth daily. 30 tablet 3   Fezolinetant  (VEOZAH ) 45 MG TABS Take 1 tablet every day by oral route. 30 tablet 0   fluticasone -salmeterol (ADVAIR  DISKUS) 500-50 MCG/ACT AEPB Inhale 1 puff into the lungs in the morning and at bedtime. 180 each 3   gabapentin  (NEURONTIN ) 100 MG capsule Take 1 capsule (100 mg total) by mouth 2 (two) times daily. 60 capsule 1   iron  polysaccharides (POLY-IRON  150) 150 MG capsule Take 1 capsule (150 mg total) by mouth daily. 90 capsule 1   levothyroxine  (SYNTHROID ) 125 MCG tablet Take 1 tablet (125 mcg total) by mouth daily. 90 tablet 3   losartan  (COZAAR ) 25 MG tablet Take 0.5 tablets (12.5 mg total) by mouth daily. NEED APPOINTMENT 45 tablet 3   magnesium oxide (MAG-OX) 400 MG tablet Take 400 mg by mouth daily.     metroNIDAZOLE  (METROCREAM ) 0.75 % cream Apply topically 2 (two) times daily. 45 g 5   MIEBO  1.338 GM/ML SOLN      mirabegron ER (MYRBETRIQ) 25 MG TB24 tablet Take 1 tablet (25 mg total) by mouth daily. 30 tablet 2   nystatin -triamcinolone  (MYCOLOG II) cream APPLY TO THE AFFECTED AREAS 2 TIMES PER DAY IN THE MORNING AND EVENING 30 g 1   pantoprazole  (PROTONIX ) 40 MG tablet Take 1 tablet (40 mg total) by mouth 2 (two) times daily before a meal. 180 tablet 3   Perfluorohexyloctane  (MIEBO ) 1.338 GM/ML SOLN Instill 1 drop into both eyes four times a day as directed 3 mL 10   potassium chloride  SA (KLOR-CON  M) 20 MEQ tablet Take 1 tablet (20 mEq total) by mouth daily as needed. (Take with torsemide ) 90 tablet 1   tirzepatide  (MOUNJARO ) 12.5 MG/0.5ML Pen Inject 12.5 mg into the skin once a week. 6 mL 3   torsemide  (DEMADEX ) 20 MG tablet Take 1 tablet (20 mg total) by mouth daily as needed. 90 tablet 1   traMADol  (ULTRAM ) 50 MG tablet Take 1 tablet (50 mg total) by mouth every 6 (six) hours as needed. 30 tablet 0   No facility-administered  medications prior to visit.    PAST MEDICAL HISTORY: Past Medical History:  Diagnosis Date   ALLERGIC RHINITIS 05/09/2008   Allergy    ANXIETY 05/23/2007   ARM PAIN, LEFT 10/23/2009   Arthritis    ASTHMA 05/23/2007   Back pain    Cardiomyopathy (HCC) 09/08/2015   Cataract    Chest pain    CHF (congestive heart failure) (HCC)    Chronic diastolic heart failure (HCC) 01/22/2020   Constipation    CONTACT DERMATITIS 06/09/2009   Diabetes mellitus without complication (HCC)    type II   Dyspnea    allergies-dust, cig smoke, rag weeds   ENDOMETRIOSIS NOS 05/23/2007   FACIAL PAIN 06/02/2010   Food allergy    FREQUENCY, URINARY 05/17/2010   GERD (gastroesophageal reflux disease)    GLUCOSE INTOLERANCE 08/28/2009   HERPES SIMPLEX, UNCOMPLICATED 05/23/2007   Hyperlipidemia 08/28/2015   HYPERTHYROIDISM 05/23/2007   Hypoactive thyroid     HYPOTHYROIDISM 05/09/2008   Impaired glucose tolerance 01/28/2011   INSOMNIA, HX OF 05/23/2007   Joint pain    LATERAL EPICONDYLITIS, RIGHT 03/10/2009   LIPOMA 10/22/2010   NECK PAIN 11/10/2010  OBESITY 05/23/2007   Osteopenia 09/28/2018   Plantar fasciitis    Both feet   Pre-diabetes    SHOULDER PAIN, LEFT 05/17/2010   SINUSITIS- ACUTE-NOS 11/03/2008   SKIN LESION 11/10/2010   Sleep apnea    Stage III chronic kidney disease (HCC)    Stage III   Unspecified Chest Pain 07/25/2008   UNSPECIFIED URTICARIA 06/04/2009   URI 08/28/2009   UTI 04/29/2008   VITAMIN D  DEFICIENCY 08/28/2009   Wheezing 07/12/2010    PAST SURGICAL HISTORY: Past Surgical History:  Procedure Laterality Date   BIOPSY OF SKIN SUBCUTANEOUS TISSUE AND/OR MUCOUS MEMBRANE  03/26/2024   Procedure: BIOPSY, SKIN, SUBCUTANEOUS TISSUE, OR MUCOUS MEMBRANE;  Surgeon: Shila Gustav GAILS, MD;  Location: [facility];  Service: Gastroenterology;;   COLONOSCOPY     COLONOSCOPY N/A 03/26/2024   Procedure: COLONOSCOPY;  Surgeon: Shila Gustav GAILS, MD;  Location: [facility];   Service: Gastroenterology;  Laterality: N/A;   COLONOSCOPY WITH PROPOFOL  N/A 04/25/2022   Procedure: COLONOSCOPY WITH PROPOFOL ;  Surgeon: Shila Gustav GAILS, MD;  Location: [facility];  Service: Gastroenterology;  Laterality: N/A;   ENDOMETRIAL ABLATION     ESOPHAGOGASTRODUODENOSCOPY N/A 03/26/2024   Procedure: EGD (ESOPHAGOGASTRODUODENOSCOPY);  Surgeon: Nandigam, Kavitha V, MD;  Location: [facility];  Service: Gastroenterology;  Laterality: N/A;   LAMINECTOMY  1996   LAPAROTOMY     exploratory   lower back surgery  07/06/2017   cyst   POLYPECTOMY     POLYPECTOMY  04/25/2022   Procedure: POLYPECTOMY;  Surgeon: Shila Gustav GAILS, MD;  Location: [facility];  Service: Gastroenterology;;   POLYPECTOMY  03/26/2024   Procedure: POLYPECTOMY, INTESTINE;  Surgeon: Shila Gustav GAILS, MD;  Location: [facility];  Service: Gastroenterology;;   s/p endometrial ablation  12/06   TUBAL LIGATION     UTERINE FIBROID SURGERY     WISDOM TOOTH EXTRACTION      FAMILY HISTORY: Family History  Problem Relation Age of Onset   Heart disease Mother        Rheumatic heart disease   Depression Mother    COPD Father    Hypertension Father    Diabetes Sister    Kidney disease Sister    Cancer Maternal Grandmother        Breast   Diabetes Cousin    Hypertension Cousin    Heart disease Cousin    Colon cancer Neg Hx    Esophageal cancer Neg Hx    Rectal cancer Neg Hx    Stomach cancer Neg Hx     SOCIAL HISTORY: Social History   Socioeconomic History   Marital status: Single    Spouse name: Not on file   Number of children: 3   Years of education: Not on file   Highest education level: Master's degree (e.g., MA, MS, MEng, MEd, MSW, MBA)  Occupational History   Occupation: SOCIAL WORKER    Employer: [employer]    Comment: [place]   Occupation: Advertising Account Executive for preschool infants  Tobacco Use   Smoking status: Former    Current packs/day: 0.00    Average packs/day:  0.5 packs/day for 12.0 years (6.0 ttl pk-yrs)    Types: Cigarettes    Start date: 11/05/1975    Quit date: 11/05/1987    Years since quitting: 36.8   Smokeless tobacco: Never  Vaping Use   Vaping status: Never Used  Substance and Sexual Activity   Alcohol use: Yes    Alcohol/week: 1.0 standard drink of alcohol  Types: 1 Glasses of wine per week    Comment: WINE   Drug use: No   Sexual activity: Not on file  Other Topics Concern   Not on file  Social History Narrative   Not on file   Social Drivers of Health   Financial Resource Strain: Low Risk  (08/19/2024)   Overall Financial Resource Strain (CARDIA)    Difficulty of Paying Living Expenses: Not very hard  Food Insecurity: No Food Insecurity (08/19/2024)   Hunger Vital Sign    Worried About Running Out of Food in the Last Year: Never true    Ran Out of Food in the Last Year: Never true  Transportation Needs: No Transportation Needs (08/19/2024)   PRAPARE - Administrator, Civil Service (Medical): No    Lack of Transportation (Non-Medical): No  Physical Activity: Insufficiently Active (08/19/2024)   Exercise Vital Sign    Days of Exercise per Week: 3 days    Minutes of Exercise per Session: 30 min  Stress: No Stress Concern Present (08/19/2024)   [employer] of Occupational Health - Occupational Stress Questionnaire    Feeling of Stress: Not at all  Social Connections: Moderately Integrated (08/19/2024)   Social Connection and Isolation Panel    Frequency of Communication with Friends and Family: More than three times a week    Frequency of Social Gatherings with Friends and Family: Not on file    Attends Religious Services: More than 4 times per year    Active Member of [employer] or Organizations: Yes    Attends Engineer, Structural: More than 4 times per year    Marital Status: Divorced  Catering Manager Violence: Not on file      PHYSICAL EXAM  There were no vitals filed for this  visit.    There is no height or weight on file to calculate BMI.  Generalized: Well developed, in no acute distress  Cardiology: normal rate and rhythm, no murmur noted Neurological examination  Mentation: Alert oriented to time, place, history taking. Follows all commands speech and language fluent Cranial nerve II-XII: Pupils were equal round reactive to light. Extraocular movements were full, visual field were full on confrontational test. Facial sensation and strength were normal. Uvula tongue midline. Head turning and shoulder shrug  were normal and symmetric. Motor: The motor testing reveals 5 over 5 strength of all 4 extremities. Good symmetric motor tone is noted throughout.  Coordination: Cerebellar testing reveals good finger-nose-finger and heel-to-shin bilaterally.  Gait and station: Gait is normal.   DIAGNOSTIC DATA (LABS, IMAGING, TESTING) - I reviewed patient records, labs, notes, testing and imaging myself where available.      No data to display           Lab Results  Component Value Date   WBC 7.0 06/17/2024   HGB 14.7 06/17/2024   HCT 43.9 06/17/2024   MCV 89.3 06/17/2024   PLT 248.0 06/17/2024      Component Value Date/Time   NA 141 06/17/2024 0918   NA 142 06/28/2022 1500   K 4.0 06/17/2024 0918   CL 104 06/17/2024 0918   CO2 29 06/17/2024 0918   GLUCOSE 80 06/17/2024 0918   BUN 17 06/17/2024 0918   BUN 20 06/28/2022 1500   CREATININE 1.16 06/17/2024 0918   CREATININE 0.97 09/17/2015 1113   CALCIUM  9.9 06/17/2024 0918   PROT 6.6 06/17/2024 0918   PROT 6.5 10/20/2021 1001   ALBUMIN 4.2 06/17/2024 9081  ALBUMIN 4.5 10/20/2021 1001   AST 12 06/17/2024 0918   ALT 11 06/17/2024 0918   ALKPHOS 73 06/17/2024 0918   BILITOT 1.0 06/17/2024 0918   BILITOT 0.5 10/20/2021 1001   GFRNONAA 38 (L) 09/03/2019 1335   GFRAA 44 (L) 09/03/2019 1335   Lab Results  Component Value Date   CHOL 152 03/28/2024   HDL 77.00 03/28/2024   LDLCALC 57 03/28/2024    TRIG 87.0 03/28/2024   CHOLHDL 2 03/28/2024   Lab Results  Component Value Date   HGBA1C 5.2 03/28/2024   Lab Results  Component Value Date   VITAMINB12 330 12/25/2023   Lab Results  Component Value Date   TSH 0.01 (L) 11/15/2023    ASSESSMENT AND PLAN 68 y.o. year old female  has a past medical history of ALLERGIC RHINITIS (05/09/2008), Allergy, ANXIETY (05/23/2007), ARM PAIN, LEFT (10/23/2009), Arthritis, ASTHMA (05/23/2007), Back pain, Cardiomyopathy (HCC) (09/08/2015), Cataract, Chest pain, CHF (congestive heart failure) (HCC), Chronic diastolic heart failure (HCC) (95/85/7978), Constipation, CONTACT DERMATITIS (06/09/2009), Diabetes mellitus without complication (HCC), Dyspnea, ENDOMETRIOSIS NOS (05/23/2007), FACIAL PAIN (06/02/2010), Food allergy, FREQUENCY, URINARY (05/17/2010), GERD (gastroesophageal reflux disease), GLUCOSE INTOLERANCE (08/28/2009), HERPES SIMPLEX, UNCOMPLICATED (05/23/2007), Hyperlipidemia (08/28/2015), HYPERTHYROIDISM (05/23/2007), Hypoactive thyroid , HYPOTHYROIDISM (05/09/2008), Impaired glucose tolerance (01/28/2011), INSOMNIA, HX OF (05/23/2007), Joint pain, LATERAL EPICONDYLITIS, RIGHT (03/10/2009), LIPOMA (10/22/2010), NECK PAIN (11/10/2010), OBESITY (05/23/2007), Osteopenia (09/28/2018), Plantar fasciitis, Pre-diabetes, SHOULDER PAIN, LEFT (05/17/2010), SINUSITIS- ACUTE-NOS (11/03/2008), SKIN LESION (11/10/2010), Sleep apnea, Stage III chronic kidney disease (HCC), Unspecified Chest Pain (07/25/2008), UNSPECIFIED URTICARIA (06/04/2009), URI (08/28/2009), UTI (04/29/2008), VITAMIN D  DEFICIENCY (08/28/2009), and Wheezing (07/12/2010). here with   No diagnosis found.     Ms. Blaize is doing very well with CPAP therapy.  She has noted significant improvement in her energy and less daytime sleepiness.  Compliance report reveals acceptable daily but sub optimal 4 hour compliance.  She was encouraged to continue using CPAP nightly and for greater than 4 hours each  night. She will use memory compensation strategies. She will follow-up annually, sooner if needed.  She verbalizes understanding and agreement with this plan.   No orders of the defined types were placed in this encounter.     No orders of the defined types were placed in this encounter.      Greig Forbes, FNP-C 08/26/2024, 8:48 AM [facility] [address] [place], [place] [address] [phone]

## 2024-08-27 ENCOUNTER — Encounter: Payer: Self-pay | Admitting: Family Medicine

## 2024-08-27 ENCOUNTER — Ambulatory Visit: Payer: Medicare Other | Admitting: Family Medicine

## 2024-08-27 VITALS — Ht 63.0 in | Wt 194.0 lb

## 2024-08-27 DIAGNOSIS — G4733 Obstructive sleep apnea (adult) (pediatric): Secondary | ICD-10-CM | POA: Diagnosis not present

## 2024-08-28 ENCOUNTER — Other Ambulatory Visit (HOSPITAL_COMMUNITY): Payer: Self-pay

## 2024-08-28 ENCOUNTER — Other Ambulatory Visit: Payer: Self-pay | Admitting: Internal Medicine

## 2024-08-28 ENCOUNTER — Other Ambulatory Visit: Payer: Self-pay

## 2024-08-28 MED ORDER — ALLOPURINOL 100 MG PO TABS
200.0000 mg | ORAL_TABLET | Freq: Every day | ORAL | 3 refills | Status: AC
  Filled 2024-08-28: qty 180, 90d supply, fill #0

## 2024-08-28 MED ORDER — DAPAGLIFLOZIN PROPANEDIOL 5 MG PO TABS
5.0000 mg | ORAL_TABLET | Freq: Every day | ORAL | 3 refills | Status: AC
  Filled 2024-08-28: qty 90, 90d supply, fill #0

## 2024-08-29 ENCOUNTER — Other Ambulatory Visit (HOSPITAL_COMMUNITY): Payer: Self-pay

## 2024-08-29 ENCOUNTER — Other Ambulatory Visit: Payer: Self-pay

## 2024-08-29 MED ORDER — CHLORHEXIDINE GLUCONATE 0.12 % MT SOLN
OROMUCOSAL | 0 refills | Status: AC
Start: 2024-08-29 — End: ?
  Filled 2024-08-29: qty 473, 16d supply, fill #0

## 2024-08-29 MED ORDER — AMOXICILLIN 500 MG PO CAPS
500.0000 mg | ORAL_CAPSULE | Freq: Three times a day (TID) | ORAL | 1 refills | Status: DC
  Filled 2024-08-29: qty 21, 7d supply, fill #0

## 2024-09-03 ENCOUNTER — Ambulatory Visit (HOSPITAL_COMMUNITY): Admit: 2024-09-03 | Admitting: Orthopedic Surgery

## 2024-09-03 SURGERY — ARTHROPLASTY, KNEE, TOTAL
Anesthesia: Spinal | Site: Knee | Laterality: Left

## 2024-09-10 DIAGNOSIS — M272 Inflammatory conditions of jaws: Secondary | ICD-10-CM | POA: Diagnosis not present

## 2024-09-18 ENCOUNTER — Other Ambulatory Visit (HOSPITAL_COMMUNITY): Payer: Self-pay

## 2024-09-18 ENCOUNTER — Other Ambulatory Visit: Payer: Self-pay

## 2024-09-18 LAB — OPHTHALMOLOGY REPORT-SCANNED

## 2024-09-18 MED ORDER — CYCLOSPORINE 0.05 % OP EMUL
1.0000 [drp] | Freq: Two times a day (BID) | OPHTHALMIC | 3 refills | Status: AC
  Filled 2024-09-18: qty 90, 90d supply, fill #0

## 2024-09-19 DIAGNOSIS — Z79899 Other long term (current) drug therapy: Secondary | ICD-10-CM | POA: Diagnosis not present

## 2024-09-20 DIAGNOSIS — Z1231 Encounter for screening mammogram for malignant neoplasm of breast: Secondary | ICD-10-CM | POA: Diagnosis not present

## 2024-09-24 ENCOUNTER — Encounter: Payer: Self-pay | Admitting: Pulmonary Disease

## 2024-09-24 ENCOUNTER — Ambulatory Visit: Admitting: Pulmonary Disease

## 2024-09-24 VITALS — BP 110/78 | HR 85 | Temp 98.0°F | Ht 64.0 in | Wt 190.0 lb

## 2024-09-24 DIAGNOSIS — G4733 Obstructive sleep apnea (adult) (pediatric): Secondary | ICD-10-CM | POA: Diagnosis not present

## 2024-09-24 DIAGNOSIS — Z87891 Personal history of nicotine dependence: Secondary | ICD-10-CM | POA: Diagnosis not present

## 2024-09-24 DIAGNOSIS — J4530 Mild persistent asthma, uncomplicated: Secondary | ICD-10-CM | POA: Diagnosis not present

## 2024-09-24 DIAGNOSIS — J4540 Moderate persistent asthma, uncomplicated: Secondary | ICD-10-CM

## 2024-09-24 NOTE — Progress Notes (Signed)
 Suzanne Hansen    [id number]    [birth year]  Primary Care Physician:John, Lynwood ORN, MD  Referring Physician: Norleen Lynwood ORN, MD [address] [place],  [place] [address]  Chief complaint: Follow-up for asthma  HPI: 68 y.o. with history of asthma, OSA, heart failure, atrial fibrillation She had an episode of pneumonia in June 2022 and another episode in December 2022 requiring multiple rounds of prednisone  and antibiotics.   Initially on Symbicort  which was changed to Advair  due to insurance issues.  Advair  dose increased in June 202 She has history of sleep apnea and follows with Dr. Chalice at [facility] neurology  Interim history:  Discussed the use of AI scribe software for clinical note transcription with the patient, who gave verbal consent to proceed.  History of Present Illness Suzanne Hansen is a 68 year old female with asthma who presents for follow-up of her respiratory condition.  Asthma control - Asthma has improved with Advair , used for over one year - Airsupra  used about once a week or less, primarily with allergen exposure such as dust or cigarette smoke  Obstructive sleep apnea and sleep quality - Uses CPAP for sleep apnea but not as consistently as recommended - Wakes at night - Sometimes falls asleep while watching television  Relevant pulmonary history Pets: No pets Occupation: Advertising account executive for early childhood Exposures: No mold, hot tub, Jacuzzi.  No feather pillows or comforters Smoking history: 6-pack-year smoker.  Quit in 1988 Travel history: No significant travel history Relevant family history: No family history of lung disease  Outpatient Encounter Medications as of 09/24/2024  Medication Sig   acetaminophen  (TYLENOL ) 650 MG CR tablet Take by mouth.   Albuterol -Budesonide  (AIRSUPRA ) 90-80 MCG/ACT AERO Inhale 2 puffs into the lungs every 4 (four) hours as needed.   allopurinol  (ZYLOPRIM ) 100 MG tablet Take 2 tablets (200 mg total) by mouth  daily.   aspirin  81 MG EC tablet Take 81 mg by mouth daily.   chlorhexidine  (PERIDEX ) 0.12 % solution Rinse mouth with 1/2 oz twice daily after breakfast and before bedtime.   Cholecalciferol  (VITAMIN D -1000 MAX ST) 25 MCG (1000 UT) tablet Take by mouth.   cycloSPORINE  (RESTASIS ) 0.05 % ophthalmic emulsion Place 1 drop into both eyes 2 (two) times daily.   dapagliflozin  propanediol (FARXIGA ) 5 MG TABS tablet Take 1 tablet (5 mg total) by mouth daily before breakfast.   diclofenac  Sodium (PENNSAID ) 2 % SOLN Apply 2 Pumps (40 mg total) topically to affected knee(s) 2 (two) times daily.   ezetimibe  (ZETIA ) 10 MG tablet Take 1 tablet (10 mg total) by mouth daily.   fexofenadine (ALLEGRA) 180 MG tablet Take 180 mg by mouth daily.   Fezolinetant  (VEOZAH ) 45 MG TABS Take 1 tablet (45 mg total) by mouth daily.   Fezolinetant  (VEOZAH ) 45 MG TABS Take 1 tablet every day by oral route.   fluticasone -salmeterol (ADVAIR  DISKUS) 500-50 MCG/ACT AEPB Inhale 1 puff into the lungs in the morning and at bedtime.   gabapentin  (NEURONTIN ) 100 MG capsule Take 1 capsule (100 mg total) by mouth 2 (two) times daily.   iron  polysaccharides (POLY-IRON  150) 150 MG capsule Take 1 capsule (150 mg total) by mouth daily.   levothyroxine  (SYNTHROID ) 125 MCG tablet Take 1 tablet (125 mcg total) by mouth daily.   losartan  (COZAAR ) 25 MG tablet Take 0.5 tablets (12.5 mg total) by mouth daily. NEED APPOINTMENT   magnesium oxide (MAG-OX) 400 MG tablet Take 400 mg by mouth  daily.   metroNIDAZOLE  (METROCREAM ) 0.75 % cream Apply topically 2 (two) times daily.   mirabegron  ER (MYRBETRIQ ) 25 MG TB24 tablet Take 1 tablet (25 mg total) by mouth daily.   nystatin -triamcinolone  (MYCOLOG II) cream APPLY TO THE AFFECTED AREAS 2 TIMES PER DAY IN THE MORNING AND EVENING   pantoprazole  (PROTONIX ) 40 MG tablet Take 1 tablet (40 mg total) by mouth 2 (two) times daily before a meal.   potassium chloride  SA (KLOR-CON  M) 20 MEQ tablet Take 1 tablet  (20 mEq total) by mouth daily as needed. (Take with torsemide )   tirzepatide  (MOUNJARO ) 12.5 MG/0.5ML Pen Inject 12.5 mg into the skin once a week. (Patient taking differently: Inject 12.5 mg into the skin once a week. Taking 10 as of 09/24/2024)   torsemide  (DEMADEX ) 20 MG tablet Take 1 tablet (20 mg total) by mouth daily as needed.   traMADol  (ULTRAM ) 50 MG tablet Take 1 tablet (50 mg total) by mouth every 6 (six) hours as needed.   amoxicillin  (AMOXIL ) 500 MG capsule Take 1 capsule (500 mg total) by mouth 3 (three) times daily until gone (Patient not taking: Reported on 09/24/2024)   estradiol  (ESTRACE ) 0.1 MG/GM vaginal cream Place 1 Applicatorful vaginally once a week. (Patient not taking: Reported on 09/24/2024)   MIEBO  1.338 GM/ML SOLN    Perfluorohexyloctane  (MIEBO ) 1.338 GM/ML SOLN Instill 1 drop into both eyes four times a day as directed   No facility-administered encounter medications on file as of 09/24/2024.   Vitals:   09/24/24 1410  BP: 110/78  Pulse: 85  Temp: 98 F (36.7 C)  Height: 5' 4 (1.626 m)  Weight: 190 lb (86.2 kg)  SpO2: 97%  TempSrc: Oral  BMI (Calculated): 32.6     Physical Exam GEN: No acute distress CV: Regular rate and rhythm no murmurs LUNGS: Clear to auscultation bilaterally normal respiratory effort SKIN JOINTS: Warm and dry no rash    Data Reviewed: Imaging: Chest x-ray 10/01/2021-atelectasis, scarring at the left lateral lung base which is stable compared to previous chest x-ray on 09/17/2021.   Cardiac CT 11/26/2021-personally visualized by me.  Atelectatic change in the anterior aspect of the left lower lobe  CT chest 06/17/2022-bandlike scarring in the right middle lobe and lingula. I have reviewed the images personally.  PFTs: 12/27/2021 FVC 2.05 [84%], FEV1 1.46 [76%], F/F 71, TLC 4.89 [99%], DLCO 14.10 [73%] Minimal diffusion defect.  Bronchodilator response with air trapping.  Labs: CBC 10/20/2021- WBC 10, eos 1%, absolute  eosinophil count 100  Assessment & Plan Mild persistent asthma Well-controlled with Advair . She uses Airsupra  as needed, approximately once a week or less, for allergens such as dust and cigarette smoke. - Continue Advair  as prescribed. - Use Airsupra  as needed for allergen exposure. - Follow up with primary care provider for asthma management.   Abnormal chest x-ray She had treatment for pneumonia in December 2022 with follow-up chest x-ray showing persistent left lower lobe opacity/atelectasis  We reviewed with minimal linear scarring which postinflammatory.  The findings are not concerning.  There is no evidence of interstitial lung disease  OSA Continue CPAP.  Managed by [facility] neurolog[facility]  Plan/Recommendations: Continue Advair , Airsupra  Continue CPAP.   Lonna Coder MD [facility] 09/24/2024, 2:17 PM  CC: Norleen Lynwood ORN, MD

## 2024-09-24 NOTE — Patient Instructions (Signed)
°  VISIT SUMMARY: Today, we discussed the management of your asthma and sleep apnea. Your asthma is well-controlled with your current medications, and we reviewed your use of CPAP for sleep apnea.  YOUR PLAN: MILD PERSISTENT ASTHMA: Your asthma is well-controlled with Advair , and you use Airsupra  as needed for allergen exposure. -Continue taking Advair  as prescribed. -Use Airsupra  as needed for allergen exposure. -Follow up with your primary care provider for ongoing asthma management.  OBSTRUCTIVE SLEEP APNEA AND SLEEP QUALITY: You are using CPAP for sleep apnea but not as consistently as recommended, which affects your sleep quality. -Try to use your CPAP machine more consistently to improve your sleep quality.                                 Contains text generated by [employer]bridge.

## 2024-09-26 ENCOUNTER — Other Ambulatory Visit: Payer: Self-pay | Admitting: Internal Medicine

## 2024-09-26 ENCOUNTER — Other Ambulatory Visit (HOSPITAL_COMMUNITY): Payer: Self-pay

## 2024-09-27 ENCOUNTER — Ambulatory Visit: Admitting: Internal Medicine

## 2024-09-27 ENCOUNTER — Other Ambulatory Visit (HOSPITAL_COMMUNITY): Payer: Self-pay

## 2024-09-27 ENCOUNTER — Telehealth: Payer: Self-pay | Admitting: Internal Medicine

## 2024-09-27 MED ORDER — FLUCONAZOLE 150 MG PO TABS
ORAL_TABLET | ORAL | 1 refills | Status: AC
  Filled 2024-09-27: qty 2, 6d supply, fill #0

## 2024-09-27 NOTE — Telephone Encounter (Signed)
Refill has been sent today 

## 2024-09-27 NOTE — Telephone Encounter (Unsigned)
 Copied from CRM #[id number]. Topic: Clinical - Medication Refill >> Sep 27, 2024  7:59 AM Victoria A wrote: Medication: Diflucan   Has the patient contacted their pharmacy? Yes (Agent: If no, request that the patient contact the pharmacy for the refill. If patient does not wish to contact the pharmacy document the reason why and proceed with request.) (Agent: If yes, when and what did the pharmacy advise?)Contact Primary  This is the patient's preferred pharmacy:  [facility] [address] [place] [address] Phone: [phone] Fax: [phone]  Is this the correct pharmacy for this prescription? Yes If no, delete pharmacy and type the correct one.   Has the prescription been filled recently? No  Is the patient out of the medication? Yes  Has the patient been seen for an appointment in the last year OR does the patient have an upcoming appointment? Yes  Can we respond through MyChart? Yes  Agent: Please be advised that Rx refills may take up to 3 business days. We ask that you follow-up with your pharmacy.

## 2024-10-15 ENCOUNTER — Ambulatory Visit: Admitting: Gastroenterology

## 2024-10-18 ENCOUNTER — Encounter: Payer: Self-pay | Admitting: Internal Medicine

## 2024-10-18 ENCOUNTER — Ambulatory Visit: Admitting: Internal Medicine

## 2024-10-18 ENCOUNTER — Other Ambulatory Visit (HOSPITAL_COMMUNITY): Payer: Self-pay

## 2024-10-18 VITALS — BP 122/80 | HR 79 | Temp 98.5°F | Ht 64.0 in | Wt 192.0 lb

## 2024-10-18 DIAGNOSIS — R058 Other specified cough: Secondary | ICD-10-CM | POA: Insufficient documentation

## 2024-10-18 DIAGNOSIS — R062 Wheezing: Secondary | ICD-10-CM

## 2024-10-18 DIAGNOSIS — E559 Vitamin D deficiency, unspecified: Secondary | ICD-10-CM | POA: Diagnosis not present

## 2024-10-18 DIAGNOSIS — I5032 Chronic diastolic (congestive) heart failure: Secondary | ICD-10-CM | POA: Diagnosis not present

## 2024-10-18 DIAGNOSIS — R051 Acute cough: Secondary | ICD-10-CM | POA: Diagnosis not present

## 2024-10-18 MED ORDER — HYDROCOD POLI-CHLORPHE POLI ER 10-8 MG/5ML PO SUER
5.0000 mL | Freq: Two times a day (BID) | ORAL | 0 refills | Status: AC | PRN
  Filled 2024-10-18: qty 115, 12d supply, fill #0

## 2024-10-18 MED ORDER — PREDNISONE 10 MG PO TABS
ORAL_TABLET | ORAL | 0 refills | Status: DC
  Filled 2024-10-18: qty 18, 9d supply, fill #0

## 2024-10-18 MED ORDER — DOXYCYCLINE HYCLATE 100 MG PO TABS
100.0000 mg | ORAL_TABLET | Freq: Two times a day (BID) | ORAL | 0 refills | Status: DC
  Filled 2024-10-18: qty 20, 10d supply, fill #0

## 2024-10-18 NOTE — Assessment & Plan Note (Signed)
Stable volume, cont current med tx 

## 2024-10-18 NOTE — Progress Notes (Signed)
 Patient ID: Suzanne Hansen, female   DOB: 25-Jun-1956, 69 y.o.   MRN: [id number]        Chief Complaint: follow up prod cough, wheezing, chf, low vit d       HPI:  Suzanne Hansen is a 69 y.o. female Here with acute onset mild to mod 2-3 days ST, HA, general weakness and malaise, with prod cough greenish sputum with mild sob doe wheezing, but Pt denies chest pain, orthopnea, PND, increased LE swelling, palpitations, dizziness or syncope.   Pt denies polydipsia, polyuria, or new focal neuro s/s.          Wt Readings from Last 3 Encounters:  10/18/24 192 lb (87.1 kg)  09/24/24 190 lb (86.2 kg)  08/27/24 194 lb (88 kg)   BP Readings from Last 3 Encounters:  10/18/24 122/80  09/24/24 110/78  08/23/24 122/84         Past Medical History:  Diagnosis Date   ALLERGIC RHINITIS 05/09/2008   Allergy    ANXIETY 05/23/2007   ARM PAIN, LEFT 10/23/2009   Arthritis    ASTHMA 05/23/2007   Back pain    Cardiomyopathy (HCC) 09/08/2015   Cataract    Chest pain    CHF (congestive heart failure) (HCC)    Chronic diastolic heart failure (HCC) 01/22/2020   Constipation    CONTACT DERMATITIS 06/09/2009   Diabetes mellitus without complication (HCC)    type II   Dyspnea    allergies-dust, cig smoke, rag weeds   ENDOMETRIOSIS NOS 05/23/2007   FACIAL PAIN 06/02/2010   Food allergy    FREQUENCY, URINARY 05/17/2010   GERD (gastroesophageal reflux disease)    GLUCOSE INTOLERANCE 08/28/2009   HERPES SIMPLEX, UNCOMPLICATED 05/23/2007   Hyperlipidemia 08/28/2015   HYPERTHYROIDISM 05/23/2007   Hypoactive thyroid     HYPOTHYROIDISM 05/09/2008   Impaired glucose tolerance 01/28/2011   INSOMNIA, HX OF 05/23/2007   Joint pain    LATERAL EPICONDYLITIS, RIGHT 03/10/2009   LIPOMA 10/22/2010   NECK PAIN 11/10/2010   OBESITY 05/23/2007   Osteopenia 09/28/2018   Plantar fasciitis    Both feet   Pre-diabetes    SHOULDER PAIN, LEFT 05/17/2010   SINUSITIS- ACUTE-NOS 11/03/2008   SKIN LESION 11/10/2010    Sleep apnea    Stage III chronic kidney disease (HCC)    Stage III   Unspecified Chest Pain 07/25/2008   UNSPECIFIED URTICARIA 06/04/2009   URI 08/28/2009   UTI 04/29/2008   VITAMIN D  DEFICIENCY 08/28/2009   Wheezing 07/12/2010   Past Surgical History:  Procedure Laterality Date   BIOPSY OF SKIN SUBCUTANEOUS TISSUE AND/OR MUCOUS MEMBRANE  03/26/2024   Procedure: BIOPSY, SKIN, SUBCUTANEOUS TISSUE, OR MUCOUS MEMBRANE;  Surgeon: Shila Gustav GAILS, MD;  Location: [facility];  Service: Gastroenterology;;   COLONOSCOPY     COLONOSCOPY N/A 03/26/2024   Procedure: COLONOSCOPY;  Surgeon: Shila Gustav GAILS, MD;  Location: [facility];  Service: Gastroenterology;  Laterality: N/A;   COLONOSCOPY WITH PROPOFOL  N/A 04/25/2022   Procedure: COLONOSCOPY WITH PROPOFOL ;  Surgeon: Shila Gustav GAILS, MD;  Location: [facility];  Service: Gastroenterology;  Laterality: N/A;   ENDOMETRIAL ABLATION     ESOPHAGOGASTRODUODENOSCOPY N/A 03/26/2024   Procedure: EGD (ESOPHAGOGASTRODUODENOSCOPY);  Surgeon: Nandigam, Kavitha V, MD;  Location: [facility];  Service: Gastroenterology;  Laterality: N/A;   LAMINECTOMY  1996   LAPAROTOMY     exploratory   lower back surgery  07/06/2017   cyst   POLYPECTOMY     POLYPECTOMY  04/25/2022   Procedure: POLYPECTOMY;  Surgeon: Nandigam, Kavitha V, MD;  Location: [facility];  Service: Gastroenterology;;   POLYPECTOMY  03/26/2024   Procedure: POLYPECTOMY, INTESTINE;  Surgeon: Shila Gustav GAILS, MD;  Location: [facility];  Service: Gastroenterology;;   s/p endometrial ablation  12/06   TUBAL LIGATION     UTERINE FIBROID SURGERY     WISDOM TOOTH EXTRACTION      reports that she quit smoking about 36 years ago. Her smoking use included cigarettes. She started smoking about 48 years ago. She has a 6 pack-year smoking history. She has never used smokeless tobacco. She reports current alcohol use of about 1.0 standard drink of alcohol per week. She reports that she does  not use drugs. family history includes COPD in her father; Cancer in her maternal grandmother; Depression in her mother; Diabetes in her cousin and sister; Heart disease in her cousin and mother; Hypertension in her cousin and father; Kidney disease in her sister. Allergies[1] Medications Ordered Prior to Encounter[2]      ROS:  All others reviewed and negative.  Objective        PE:  BP 122/80 (BP Location: Right Arm, Patient Position: Sitting, Cuff Size: Normal)   Pulse 79   Temp 98.5 F (36.9 C) (Oral)   Ht 5' 4 (1.626 m)   Wt 192 lb (87.1 kg)   SpO2 98%   BMI 32.96 kg/m                 Constitutional: Pt appears mild ill               HENT: Head: NCAT.                Right Ear: External ear normal.                 Left Ear: External ear normal. Bilat tm's with mild erythema.  Max sinus areas non tender.  Pharynx with mild erythema, no exudate               Eyes: . Pupils are equal, round, and reactive to light. Conjunctivae and EOM are normal               Nose: without d/c or deformity               Neck: Neck supple. Gross normal ROM               Cardiovascular: Normal rate and regular rhythm.                 Pulmonary/Chest: Effort normal and breath sounds decreased without rales but with mild few bilateral wheezing.                Neurological: Pt is alert. At baseline orientation, motor grossly intact               Skin: Skin is warm. No rashes, no other new lesions, LE edema - none               Psychiatric: Pt behavior is normal without agitation   Micro: none  Cardiac tracings I have personally interpreted today:  none  Pertinent Radiological findings (summarize): none   Lab Results  Component Value Date   WBC 7.0 06/17/2024   HGB 14.7 06/17/2024   HCT 43.9 06/17/2024   PLT 248.0 06/17/2024   GLUCOSE 75 08/23/2024   CHOL 149 08/23/2024   TRIG 51.0 08/23/2024   HDL 73.10 08/23/2024   LDLCALC 65  08/23/2024   ALT 13 08/23/2024   AST 16 08/23/2024   NA 139  08/23/2024   K 4.0 08/23/2024   CL 104 08/23/2024   CREATININE 1.08 08/23/2024   BUN 19 08/23/2024   CO2 28 08/23/2024   TSH 0.01 (L) 11/15/2023   INR 1.0 03/21/2017   HGBA1C 5.0 08/23/2024   MICROALBUR <0.7 08/23/2024   Assessment/Plan:  Janessa Mickle is a 69 y.o. Black or African American [2] female with  has a past medical history of ALLERGIC RHINITIS (05/09/2008), Allergy, ANXIETY (05/23/2007), ARM PAIN, LEFT (10/23/2009), Arthritis, ASTHMA (05/23/2007), Back pain, Cardiomyopathy (HCC) (09/08/2015), Cataract, Chest pain, CHF (congestive heart failure) (HCC), Chronic diastolic heart failure (HCC) (95/85/7978), Constipation, CONTACT DERMATITIS (06/09/2009), Diabetes mellitus without complication (HCC), Dyspnea, ENDOMETRIOSIS NOS (05/23/2007), FACIAL PAIN (06/02/2010), Food allergy, FREQUENCY, URINARY (05/17/2010), GERD (gastroesophageal reflux disease), GLUCOSE INTOLERANCE (08/28/2009), HERPES SIMPLEX, UNCOMPLICATED (05/23/2007), Hyperlipidemia (08/28/2015), HYPERTHYROIDISM (05/23/2007), Hypoactive thyroid , HYPOTHYROIDISM (05/09/2008), Impaired glucose tolerance (01/28/2011), INSOMNIA, HX OF (05/23/2007), Joint pain, LATERAL EPICONDYLITIS, RIGHT (03/10/2009), LIPOMA (10/22/2010), NECK PAIN (11/10/2010), OBESITY (05/23/2007), Osteopenia (09/28/2018), Plantar fasciitis, Pre-diabetes, SHOULDER PAIN, LEFT (05/17/2010), SINUSITIS- ACUTE-NOS (11/03/2008), SKIN LESION (11/10/2010), Sleep apnea, Stage III chronic kidney disease (HCC), Unspecified Chest Pain (07/25/2008), UNSPECIFIED URTICARIA (06/04/2009), URI (08/28/2009), UTI (04/29/2008), VITAMIN D  DEFICIENCY (08/28/2009), and Wheezing (07/12/2010).  Chronic heart failure with preserved ejection fraction (HCC) Stable volume, cont current med tx  Vitamin D  deficiency Last vitamin D  Lab Results  Component Value Date   VD25OH 29.46 (L) 11/15/2023   Low, to start oral replacement   Wheezing Mild to mod, for prednisone  taper,  to f/u any  worsening symptoms or concerns  Cough Mild to mod, c/w bronchitis vs pna, for antibx course doxycycline  100 bid,cough med prn, declines cxr for now,  to f/u any worsening symptoms or concerns  Followup: Return if symptoms worsen or fail to improve.  Lynwood Rush, MD 10/18/2024 7:16 PM [facility]     [1]  Allergies Allergen Reactions   Shellfish Allergy Anaphylaxis   Clarithromycin Nausea And Vomiting   Latex Itching and Other (See Comments)    ITCHING AND COLD SORES AROUND MOUTH WHEN LATEX GLOVES USED BY THE DENTIST/HYGENTIST   Metronidazole  Nausea And Vomiting   Zostavax [Zoster Vaccine Live]     Arm swelled   Adhesive [Tape] Dermatitis    Plastic tape    Amiloride     foot numbness   Clindamycin Nausea Only and Nausea And Vomiting   Gadobenate     Pt has had some form of dye without issues but isnt sure if she is allergic to this specific agent    Lisinopril  Cough   Naproxen Sodium Other (See Comments)  [2]  Current Outpatient Medications on File Prior to Visit  Medication Sig Dispense Refill   acetaminophen  (TYLENOL ) 650 MG CR tablet Take by mouth.     Albuterol -Budesonide  (AIRSUPRA ) 90-80 MCG/ACT AERO Inhale 2 puffs into the lungs every 4 (four) hours as needed. 10.7 g 3   allopurinol  (ZYLOPRIM ) 100 MG tablet Take 2 tablets (200 mg total) by mouth daily. 180 tablet 3   amoxicillin  (AMOXIL ) 500 MG capsule Take 1 capsule (500 mg total) by mouth 3 (three) times daily until gone 21 capsule 1   aspirin  81 MG EC tablet Take 81 mg by mouth daily.     chlorhexidine  (PERIDEX ) 0.12 % solution Rinse mouth with 1/2 oz twice daily after breakfast and before bedtime. 473 mL 0  Cholecalciferol  (VITAMIN D -1000 MAX ST) 25 MCG (1000 UT) tablet Take by mouth.     cycloSPORINE  (RESTASIS ) 0.05 % ophthalmic emulsion Place 1 drop into both eyes 2 (two) times daily. 90 each 3   dapagliflozin  propanediol (FARXIGA ) 5 MG TABS  tablet Take 1 tablet (5 mg total) by mouth daily before breakfast. 90 tablet 3   diclofenac  Sodium (PENNSAID ) 2 % SOLN Apply 2 Pumps (40 mg total) topically to affected knee(s) 2 (two) times daily. 112 g 1   estradiol  (ESTRACE ) 0.1 MG/GM vaginal cream Place 1 Applicatorful vaginally once a week.     ezetimibe  (ZETIA ) 10 MG tablet Take 1 tablet (10 mg total) by mouth daily. 90 tablet 3   fexofenadine (ALLEGRA) 180 MG tablet Take 180 mg by mouth daily.     Fezolinetant  (VEOZAH ) 45 MG TABS Take 1 tablet (45 mg total) by mouth daily. 30 tablet 3   Fezolinetant  (VEOZAH ) 45 MG TABS Take 1 tablet every day by oral route. 30 tablet 0   fluconazole  (DIFLUCAN ) 150 MG tablet Take 1 tablet by mouth every 3 days as needed 2 tablet 1   fluticasone -salmeterol (ADVAIR  DISKUS) 500-50 MCG/ACT AEPB Inhale 1 puff into the lungs in the morning and at bedtime. 180 each 3   gabapentin  (NEURONTIN ) 100 MG capsule Take 1 capsule (100 mg total) by mouth 2 (two) times daily. 60 capsule 1   iron  polysaccharides (POLY-IRON  150) 150 MG capsule Take 1 capsule (150 mg total) by mouth daily. 90 capsule 1   levothyroxine  (SYNTHROID ) 125 MCG tablet Take 1 tablet (125 mcg total) by mouth daily. 90 tablet 3   losartan  (COZAAR ) 25 MG tablet Take 0.5 tablets (12.5 mg total) by mouth daily. NEED APPOINTMENT 45 tablet 3   magnesium oxide (MAG-OX) 400 MG tablet Take 400 mg by mouth daily.     metroNIDAZOLE  (METROCREAM ) 0.75 % cream Apply topically 2 (two) times daily. 45 g 5   MIEBO  1.338 GM/ML SOLN      mirabegron  ER (MYRBETRIQ ) 25 MG TB24 tablet Take 1 tablet (25 mg total) by mouth daily. 30 tablet 2   nystatin -triamcinolone  (MYCOLOG II) cream APPLY TO THE AFFECTED AREAS 2 TIMES PER DAY IN THE MORNING AND EVENING 30 g 1   pantoprazole  (PROTONIX ) 40 MG tablet Take 1 tablet (40 mg total) by mouth 2 (two) times daily before a meal. 180 tablet 3   Perfluorohexyloctane  (MIEBO ) 1.338 GM/ML SOLN Instill 1 drop into both eyes four times a day as  directed 3 mL 10   potassium chloride  SA (KLOR-CON  M) 20 MEQ tablet Take 1 tablet (20 mEq total) by mouth daily as needed. (Take with torsemide ) 90 tablet 1   tirzepatide  (MOUNJARO ) 12.5 MG/0.5ML Pen Inject 12.5 mg into the skin once a week. (Patient taking differently: Inject 12.5 mg into the skin once a week. Taking 10 as of 09/24/2024) 6 mL 3   torsemide  (DEMADEX ) 20 MG tablet Take 1 tablet (20 mg total) by mouth daily as needed. 90 tablet 1   traMADol  (ULTRAM ) 50 MG tablet Take 1 tablet (50 mg total) by mouth every 6 (six) hours as needed. 30 tablet 0   No current facility-administered medications on file prior to visit.

## 2024-10-18 NOTE — Patient Instructions (Signed)
Please take all new medication as prescribed - the antibiotic, and cough medicine as needed, and prednisone  Please continue all other medications as before, and refills have been done if requested.  Please have the pharmacy call with any other refills you may need.  Please keep your appointments with your specialists as you may have planned

## 2024-10-18 NOTE — Assessment & Plan Note (Signed)
 Mild to mod, c/w bronchitis vs pna, for antibx course doxycycline  100 bid,cough med prn, declines cxr for now,  to f/u any worsening symptoms or concerns

## 2024-10-18 NOTE — Assessment & Plan Note (Signed)
 Mild to mod, for prednisone taper, to f/u any worsening symptoms or concerns

## 2024-10-18 NOTE — Assessment & Plan Note (Signed)
 Last vitamin D  Lab Results  Component Value Date   VD25OH 29.46 (L) 11/15/2023   Low, to start oral replacement

## 2024-11-02 ENCOUNTER — Other Ambulatory Visit: Payer: Self-pay | Admitting: Pulmonary Disease

## 2024-11-02 ENCOUNTER — Other Ambulatory Visit (HOSPITAL_COMMUNITY): Payer: Self-pay

## 2024-11-06 ENCOUNTER — Other Ambulatory Visit: Payer: Self-pay | Admitting: Pulmonary Disease

## 2024-11-06 ENCOUNTER — Other Ambulatory Visit (HOSPITAL_COMMUNITY): Payer: Self-pay

## 2024-11-06 MED ORDER — AIRSUPRA 90-80 MCG/ACT IN AERO
2.0000 | INHALATION_SPRAY | RESPIRATORY_TRACT | 3 refills | Status: AC | PRN
  Filled 2024-11-06: qty 10.7, 30d supply, fill #0

## 2024-11-06 NOTE — Telephone Encounter (Signed)
 Copied from CRM [id number]. Topic: Clinical - Medication Refill >> Nov 06, 2024  4:24 PM Joesph PARAS wrote: Medication: Albuterol -Budesonide  (AIRSUPRA ) 90-80 MCG/ACT AERO  Has the patient contacted their pharmacy? Yes - Requests sent to odd pool?  This is the patient's preferred pharmacy:  [facility] [address] [place] [address] Phone: [phone] Fax: [phone]  Is this the correct pharmacy for this prescription? Yes If no, delete pharmacy and type the correct one.   Has the prescription been filled recently? No  Is the patient out of the medication? No - States has a little bit left but does not want to risk running out.   Has the patient been seen for an appointment in the last year OR does the patient have an upcoming appointment? Yes  Can we respond through MyChart? Yes  Agent: Please be advised that Rx refills may take up to 3 business days. We ask that you follow-up with your pharmacy.    Rx sent, pt notified via phone call and verbalized understanding, NFN.

## 2024-11-12 ENCOUNTER — Other Ambulatory Visit (HOSPITAL_COMMUNITY): Payer: Self-pay

## 2024-11-15 ENCOUNTER — Ambulatory Visit: Admitting: Gastroenterology

## 2024-11-15 ENCOUNTER — Encounter: Payer: Self-pay | Admitting: Gastroenterology

## 2024-11-15 ENCOUNTER — Ambulatory Visit: Payer: Self-pay | Admitting: Gastroenterology

## 2024-11-15 ENCOUNTER — Other Ambulatory Visit

## 2024-11-15 VITALS — BP 126/84 | HR 74 | Ht 64.0 in | Wt 191.0 lb

## 2024-11-15 DIAGNOSIS — D500 Iron deficiency anemia secondary to blood loss (chronic): Secondary | ICD-10-CM

## 2024-11-15 DIAGNOSIS — E538 Deficiency of other specified B group vitamins: Secondary | ICD-10-CM

## 2024-11-15 LAB — IBC PANEL
Iron: 52 ug/dL (ref 42–145)
Saturation Ratios: 12 % — ABNORMAL LOW (ref 20.0–50.0)
TIBC: 432.6 ug/dL (ref 250.0–450.0)
Transferrin: 309 mg/dL (ref 212.0–360.0)

## 2024-11-15 LAB — VITAMIN B12: Vitamin B-12: 203 pg/mL — ABNORMAL LOW (ref 211–911)

## 2024-11-15 NOTE — Progress Notes (Signed)
 "                Suzanne Hansen    [id number]    Jan 17, 1956  Primary Care Physician:John, Lynwood ORN, MD  Referring Physician: Norleen Lynwood ORN, MD [address] [place],  [place] [address]   Chief complaint: Iron  deficiency anemia  Discussed the use of AI scribe software for clinical note transcription with the patient, who gave verbal consent to proceed.  History of Present Illness  69 year old female with iron  deficiency anemia, gastritis, chronic idiopathic constipation, and a history of colorectal adenomatous polyps who presents for follow-up and laboratory monitoring.  Iron  Deficiency Anemia: - Managed with oral iron  supplementation, resulting in improved iron  levels - Occasionally misses doses, including yesterday and today - Iron  supplementation is generally well tolerated - Last blood counts in September 2025 were within normal limits  Gastrointestinal Symptoms and Findings: - Chronic idiopathic constipation with infrequent bowel movements and mild sensation of being backed up - Constipation is occasionally worsened by iron  supplementation - History of gastritis identified on upper endoscopy in June 2025  Colorectal Polyps: - History of colorectal adenomatous polyps - Colonoscopy in June 2025 revealed precancerous polyps, which were removed - Previous colonoscopies and endoscopies, including one in 2018  Vitamin B12 Deficiency: - Borderline low vitamin B12 level at 330 in March 2025 - Not currently taking B12 supplementation  Celiac Disease Screening: - No personal history of celiac disease - Interested in celiac testing due to family history in her granddaughter, who has had multiple fractures and dietary restrictions - No prior celiac disease testing performed; blood work for screening discussed  Anesthesia Considerations: - History of difficult airway impacting anesthesia management during procedures - Prior episode of difficult airway during spine surgery -  Hospital-based colonoscopy and endoscopy in June 2025 due to airway concerns  Laboratory Monitoring: - Comprehensive metabolic panel and cholesterol in November 2025 were normal      Outpatient Encounter Medications as of 11/15/2024  Medication Sig   acetaminophen  (TYLENOL ) 650 MG CR tablet Take by mouth.   Albuterol -Budesonide  (AIRSUPRA ) 90-80 MCG/ACT AERO Inhale 2 puffs into the lungs every 4 (four) hours as needed.   allopurinol  (ZYLOPRIM ) 100 MG tablet Take 2 tablets (200 mg total) by mouth daily.   aspirin  81 MG EC tablet Take 81 mg by mouth daily.   chlorhexidine  (PERIDEX ) 0.12 % solution Rinse mouth with 1/2 oz twice daily after breakfast and before bedtime.   chlorpheniramine -HYDROcodone  (TUSSIONEX) 10-8 MG/5ML Take 5 mLs by mouth every 12 (twelve) hours as needed for cough.   Cholecalciferol  (VITAMIN D -1000 MAX ST) 25 MCG (1000 UT) tablet Take by mouth.   cycloSPORINE  (RESTASIS ) 0.05 % ophthalmic emulsion Place 1 drop into both eyes 2 (two) times daily.   dapagliflozin  propanediol (FARXIGA ) 5 MG TABS tablet Take 1 tablet (5 mg total) by mouth daily before breakfast.   diclofenac  Sodium (PENNSAID ) 2 % SOLN Apply 2 Pumps (40 mg total) topically to affected knee(s) 2 (two) times daily.   estradiol  (ESTRACE ) 0.1 MG/GM vaginal cream Place 1 Applicatorful vaginally once a week.   ezetimibe  (ZETIA ) 10 MG tablet Take 1 tablet (10 mg total) by mouth daily.   fexofenadine (ALLEGRA) 180 MG tablet Take 180 mg by mouth daily.   fluconazole  (DIFLUCAN ) 150 MG tablet Take 1 tablet by mouth every 3 days as needed   fluticasone -salmeterol (ADVAIR  DISKUS) 500-50 MCG/ACT AEPB Inhale 1 puff into the lungs in the morning and at bedtime.   gabapentin  (  NEURONTIN ) 100 MG capsule Take 1 capsule (100 mg total) by mouth 2 (two) times daily.   iron  polysaccharides (POLY-IRON  150) 150 MG capsule Take 1 capsule (150 mg total) by mouth daily.   levothyroxine  (SYNTHROID ) 125 MCG tablet Take 1 tablet (125 mcg total)  by mouth daily.   losartan  (COZAAR ) 25 MG tablet Take 0.5 tablets (12.5 mg total) by mouth daily. NEED APPOINTMENT   pantoprazole  (PROTONIX ) 40 MG tablet Take 1 tablet (40 mg total) by mouth 2 (two) times daily before a meal.   potassium chloride  SA (KLOR-CON  M) 20 MEQ tablet Take 1 tablet (20 mEq total) by mouth daily as needed. (Take with torsemide )   tirzepatide  (MOUNJARO ) 12.5 MG/0.5ML Pen Inject 12.5 mg into the skin once a week. (Patient taking differently: Inject 12.5 mg into the skin once a week. Taking 10 as of 09/24/2024)   torsemide  (DEMADEX ) 20 MG tablet Take 1 tablet (20 mg total) by mouth daily as needed.   [DISCONTINUED] amoxicillin  (AMOXIL ) 500 MG capsule Take 1 capsule (500 mg total) by mouth 3 (three) times daily until gone   [DISCONTINUED] doxycycline  (VIBRA -TABS) 100 MG tablet Take 1 tablet (100 mg total) by mouth 2 (two) times daily.   [DISCONTINUED] Fezolinetant  (VEOZAH ) 45 MG TABS Take 1 tablet (45 mg total) by mouth daily.   [DISCONTINUED] Fezolinetant  (VEOZAH ) 45 MG TABS Take 1 tablet every day by oral route.   [DISCONTINUED] magnesium oxide (MAG-OX) 400 MG tablet Take 400 mg by mouth daily.   [DISCONTINUED] metroNIDAZOLE  (METROCREAM ) 0.75 % cream Apply topically 2 (two) times daily.   [DISCONTINUED] MIEBO  1.338 GM/ML SOLN    [DISCONTINUED] mirabegron  ER (MYRBETRIQ ) 25 MG TB24 tablet Take 1 tablet (25 mg total) by mouth daily.   [DISCONTINUED] nystatin -triamcinolone  (MYCOLOG II) cream APPLY TO THE AFFECTED AREAS 2 TIMES PER DAY IN THE MORNING AND EVENING   [DISCONTINUED] Perfluorohexyloctane  (MIEBO ) 1.338 GM/ML SOLN Instill 1 drop into both eyes four times a day as directed   [DISCONTINUED] predniSONE  (DELTASONE ) 10 MG tablet Take 3 tabs by mouth daily for 3 days, then 2 tabs dailyfor 3 days,then 1 tab daily for 3 days   [DISCONTINUED] traMADol  (ULTRAM ) 50 MG tablet Take 1 tablet (50 mg total) by mouth every 6 (six) hours as needed.   No facility-administered encounter  medications on file as of 11/15/2024.    Allergies as of 11/15/2024 - Review Complete 11/15/2024  Allergen Reaction Noted   Shellfish allergy Anaphylaxis 08/09/2018   Clarithromycin Nausea And Vomiting 09/09/2015   Latex Itching and Other (See Comments) 01/02/2013   Metronidazole  Nausea And Vomiting 05/23/2007   Zostavax [zoster vaccine live]  09/27/2017   Adhesive [tape] Dermatitis 09/02/2019   Amiloride  01/13/2021   Clindamycin Nausea Only and Nausea And Vomiting 04/19/2022   Gadobenate  01/13/2021   Lisinopril  Cough 08/09/2018   Naproxen sodium Other (See Comments) 03/28/2024    Past Medical History:  Diagnosis Date   ALLERGIC RHINITIS 05/09/2008   Allergy    ANXIETY 05/23/2007   ARM PAIN, LEFT 10/23/2009   Arthritis    ASTHMA 05/23/2007   Back pain    Cardiomyopathy (HCC) 09/08/2015   Cataract    Chest pain    CHF (congestive heart failure) (HCC)    Chronic diastolic heart failure (HCC) 01/22/2020   Constipation    CONTACT DERMATITIS 06/09/2009   Diabetes mellitus without complication (HCC)    type II   Dyspnea    allergies-dust, cig smoke, rag weeds   ENDOMETRIOSIS NOS 05/23/2007  FACIAL PAIN 06/02/2010   Food allergy    FREQUENCY, URINARY 05/17/2010   GERD (gastroesophageal reflux disease)    GLUCOSE INTOLERANCE 08/28/2009   HERPES SIMPLEX, UNCOMPLICATED 05/23/2007   Hyperlipidemia 08/28/2015   HYPERTHYROIDISM 05/23/2007   Hypoactive thyroid     HYPOTHYROIDISM 05/09/2008   Impaired glucose tolerance 01/28/2011   INSOMNIA, HX OF 05/23/2007   Joint pain    LATERAL EPICONDYLITIS, RIGHT 03/10/2009   LIPOMA 10/22/2010   NECK PAIN 11/10/2010   OBESITY 05/23/2007   Osteopenia 09/28/2018   Plantar fasciitis    Both feet   Pre-diabetes    SHOULDER PAIN, LEFT 05/17/2010   SINUSITIS- ACUTE-NOS 11/03/2008   SKIN LESION 11/10/2010   Sleep apnea    Stage III chronic kidney disease (HCC)    Stage III   Unspecified Chest Pain 07/25/2008   UNSPECIFIED URTICARIA  06/04/2009   URI 08/28/2009   UTI 04/29/2008   VITAMIN D  DEFICIENCY 08/28/2009   Wheezing 07/12/2010    Past Surgical History:  Procedure Laterality Date   BIOPSY OF SKIN SUBCUTANEOUS TISSUE AND/OR MUCOUS MEMBRANE  03/26/2024   Procedure: BIOPSY, SKIN, SUBCUTANEOUS TISSUE, OR MUCOUS MEMBRANE;  Surgeon: Shila Gustav GAILS, MD;  Location: [facility];  Service: Gastroenterology;;   COLONOSCOPY     COLONOSCOPY N/A 03/26/2024   Procedure: COLONOSCOPY;  Surgeon: Shila Gustav GAILS, MD;  Location: [facility];  Service: Gastroenterology;  Laterality: N/A;   COLONOSCOPY WITH PROPOFOL  N/A 04/25/2022   Procedure: COLONOSCOPY WITH PROPOFOL ;  Surgeon: Shila Gustav GAILS, MD;  Location: [facility];  Service: Gastroenterology;  Laterality: N/A;   ENDOMETRIAL ABLATION     ESOPHAGOGASTRODUODENOSCOPY N/A 03/26/2024   Procedure: EGD (ESOPHAGOGASTRODUODENOSCOPY);  Surgeon: Bardia Wangerin V, MD;  Location: [facility];  Service: Gastroenterology;  Laterality: N/A;   LAMINECTOMY  1996   LAPAROTOMY     exploratory   lower back surgery  07/06/2017   cyst   POLYPECTOMY     POLYPECTOMY  04/25/2022   Procedure: POLYPECTOMY;  Surgeon: Shila Gustav GAILS, MD;  Location: [facility];  Service: Gastroenterology;;   POLYPECTOMY  03/26/2024   Procedure: POLYPECTOMY, INTESTINE;  Surgeon: Shila Gustav GAILS, MD;  Location: [facility];  Service: Gastroenterology;;   s/p endometrial ablation  12/06   TUBAL LIGATION     UTERINE FIBROID SURGERY     WISDOM TOOTH EXTRACTION      Family History  Problem Relation Age of Onset   Heart disease Mother        Rheumatic heart disease   Depression Mother    COPD Father    Hypertension Father    Diabetes Sister    Kidney disease Sister    Cancer Maternal Grandmother        Breast   Diabetes Cousin    Hypertension Cousin    Heart disease Cousin    Colon cancer Neg Hx    Esophageal cancer Neg Hx    Rectal cancer Neg Hx    Stomach cancer Neg Hx      Social History   Socioeconomic History   Marital status: Single    Spouse name: Not on file   Number of children: 3   Years of education: Not on file   Highest education level: Master's degree (e.g., MA, MS, MEng, MEd, MSW, MBA)  Occupational History   Occupation: SOCIAL WORKER    Employer: [employer]    Comment: [place]   Occupation: Advertising Account Executive for preschool infants  Tobacco Use   Smoking status: Former  Current packs/day: 0.00    Average packs/day: 0.5 packs/day for 12.0 years (6.0 ttl pk-yrs)    Types: Cigarettes    Start date: 11/05/1975    Quit date: 11/05/1987    Years since quitting: 37.0   Smokeless tobacco: Never  Vaping Use   Vaping status: Never Used  Substance and Sexual Activity   Alcohol use: Yes    Alcohol/week: 1.0 standard drink of alcohol    Types: 1 Glasses of wine per week    Comment: WINE   Drug use: No   Sexual activity: Not on file  Other Topics Concern   Not on file  Social History Narrative   Not on file   Social Drivers of Health   Tobacco Use: Medium Risk (10/18/2024)   Patient History    Smoking Tobacco Use: Former    Smokeless Tobacco Use: Never    Passive Exposure: Not on file  Financial Resource Strain: Low Risk (08/19/2024)   Overall Financial Resource Strain (CARDIA)    Difficulty of Paying Living Expenses: Not very hard  Food Insecurity: No Food Insecurity (08/19/2024)   Epic    Worried About Programme Researcher, Broadcasting/film/video in the Last Year: Never true    Ran Out of Food in the Last Year: Never true  Transportation Needs: No Transportation Needs (08/19/2024)   Epic    Lack of Transportation (Medical): No    Lack of Transportation (Non-Medical): No  Physical Activity: Insufficiently Active (08/19/2024)   Exercise Vital Sign    Days of Exercise per Week: 3 days    Minutes of Exercise per Session: 30 min  Stress: No Stress Concern Present (08/19/2024)   [employer] of Occupational Health - Occupational  Stress Questionnaire    Feeling of Stress: Not at all  Social Connections: Moderately Integrated (08/19/2024)   Social Connection and Isolation Panel    Frequency of Communication with Friends and Family: More than three times a week    Frequency of Social Gatherings with Friends and Family: Not on file    Attends Religious Services: More than 4 times per year    Active Member of Clubs or Organizations: Yes    Attends Banker Meetings: More than 4 times per year    Marital Status: Divorced  Intimate Partner Violence: Not on file  Depression (PHQ2-9): Low Risk (10/18/2024)   Depression (PHQ2-9)    PHQ-2 Score: 0  Alcohol Screen: Low Risk (08/19/2024)   Alcohol Screen    Last Alcohol Screening Score (AUDIT): 1  Housing: Low Risk (08/19/2024)   Epic    Unable to Pay for Housing in the Last Year: No    Number of Times Moved in the Last Year: 0    Homeless in the Last Year: No  Utilities: Not on file  Health Literacy: Not on file      Review of systems: All other review of systems negative except as mentioned in the HPI.   Physical Exam: Vitals:   11/15/24 1419  BP: 126/84  Pulse: 74   Body mass index is 32.79 kg/m. Gen:      No acute distress HEENT:  sclera anicteric CV: s1s2 rrr, no murmur Lungs: B/l clear. Abd:      soft, non-tender; no palpable masses, no distension Ext:    No edema Neuro: alert and oriented x 3 Psych: normal mood and affect  Data Reviewed:  Reviewed labs, radiology imaging, old records and pertinent past GI work up    Assessment &  Plan   History of colorectal adenomatous polyps Surveillance is maintained at five-year intervals to monitor for recurrence and prevent malignant transformation. - Maintained five-year surveillance interval for colonoscopy. - Discussed rationale for continued monitoring despite normal findings.  Iron  deficiency anemia Anemia is improving with oral iron  supplementation. Iron  stores are approaching  target, but mild constipation is present. Ongoing risk of recurrence exists if gastrointestinal blood loss persists. - Recommended reducing iron  supplementation to every other day due to improved iron  reserves and mild constipation. - Ordered repeat iron  studies to monitor levels. - Provided guidance to monitor for symptoms of iron  deficiency and constipation.  Gastritis Chronic gastritis is likely contributing to ongoing blood loss and iron  deficiency. Negative for H. Pylori Avoid NSAIDs Continue pantoprazole  daily  Chronic idiopathic constipation Constipation is mild and likely exacerbated by iron  supplementation. Expected to improve with reduced iron  dosing. - Recommended reducing iron  supplementation to every other day to minimize constipation. - Provided reassurance that constipation is mild and expected to resolve with decreased iron  intake.  Vitamin B12 deficiency Vitamin B12 level was previously borderline low and requires monitoring to prevent worsening anemia. - Ordered repeat vitamin B12 level.  Screening for celiac disease Screening is indicated due to family history and anemia, despite absence of clinical or endoscopic evidence. - Ordered celiac disease antibody testing as part of blood work.   Return in 1 year or sooner if needed  This visit required 40 minutes of patient care (this includes precharting, chart review, review of results, face-to-face time used for counseling as well as treatment plan and follow-up. The patient was provided an opportunity to ask questions and all were answered. The patient agreed with the plan and demonstrated an understanding of the instructions.  LOIS Wilkie Mcgee , MD    CC: Norleen Lynwood ORN, MD    "

## 2024-11-15 NOTE — Patient Instructions (Addendum)
 Your provider has requested that you go to the basement level for lab work before leaving today. Press B on the elevator. The lab is located at the first door on the left as you exit the elevator.   VISIT SUMMARY:  Today, we discussed your iron  deficiency anemia, chronic idiopathic constipation, history of colorectal adenomatous polyps, and borderline low vitamin B12 levels. We also addressed your interest in celiac disease screening due to family history.  YOUR PLAN:   HISTORY OF COLORECTAL ADENOMATOUS POLYPS: We are continuing to monitor for recurrence and prevent malignant transformation with five-year surveillance intervals. -We will maintain the five-year surveillance interval for colonoscopy, due June 2030. -We discussed the rationale for continued monitoring despite normal findings.  IRON  DEFICIENCY ANEMIA: Your anemia is improving with oral iron  supplementation, but mild constipation is present. There is an ongoing risk of recurrence if gastrointestinal blood loss persists. -Reduce iron  supplementation to every other day due to improved iron  reserves and mild constipation. -We will repeat iron  studies to monitor your levels. -Monitor for symptoms of iron  deficiency and constipation.  GASTRITIS: Your chronic gastritis is likely contributing to ongoing blood loss and iron  deficiency. -We will manage your iron  deficiency anemia in conjunction with controlling your gastritis.  CHRONIC IDIOPATHIC CONSTIPATION: Your constipation is mild and likely exacerbated by iron  supplementation. It is expected to improve with reduced iron  dosing. -Reduce iron  supplementation to every other day to minimize constipation. -Your constipation is mild and expected to resolve with decreased iron  intake.  VITAMIN B12 DEFICIENCY: Your vitamin B12 level was previously borderline low and requires monitoring to prevent worsening anemia. -We will repeat your vitamin B12 level.  SCREENING FOR CELIAC DISEASE:  Screening is indicated due to family history and anemia, despite the absence of clinical or endoscopic evidence. -We will perform celiac disease antibody testing as part of your blood work.  Due to recent changes in healthcare laws, you may see the results of your imaging and laboratory studies on MyChart before your provider has had a chance to review them.  We understand that in some cases there may be results that are confusing or concerning to you. Not all laboratory results come back in the same time frame and the provider may be waiting for multiple results in order to interpret others.  Please give us  48 hours in order for your provider to thoroughly review all the results before contacting the office for clarification of your results.    I appreciate the  opportunity to care for you  Thank You   Kavitha Nandigam , MD

## 2024-12-16 ENCOUNTER — Ambulatory Visit: Admitting: Internal Medicine

## 2025-09-09 ENCOUNTER — Ambulatory Visit: Admitting: Family Medicine
